# Patient Record
Sex: Female | Born: 1985 | Race: White | Hispanic: No | Marital: Single | Smoking: Current every day smoker
Health system: Southern US, Community
[De-identification: ages and names within clinical notes are randomized; demographics above are authoritative.]

## PROBLEM LIST (undated history)

## (undated) DIAGNOSIS — I2690 Septic pulmonary embolism without acute cor pulmonale: Secondary | ICD-10-CM

## (undated) DIAGNOSIS — R600 Localized edema: Secondary | ICD-10-CM

## (undated) DIAGNOSIS — L03011 Cellulitis of right finger: Secondary | ICD-10-CM

## (undated) DIAGNOSIS — Z4659 Encounter for fitting and adjustment of other gastrointestinal appliance and device: Secondary | ICD-10-CM

## (undated) DIAGNOSIS — F1990 Other psychoactive substance use, unspecified, uncomplicated: Secondary | ICD-10-CM

## (undated) DIAGNOSIS — R8290 Unspecified abnormal findings in urine: Secondary | ICD-10-CM

## (undated) DIAGNOSIS — D72819 Decreased white blood cell count, unspecified: Secondary | ICD-10-CM

## (undated) DIAGNOSIS — D65 Disseminated intravascular coagulation [defibrination syndrome]: Secondary | ICD-10-CM

## (undated) DIAGNOSIS — J189 Pneumonia, unspecified organism: Secondary | ICD-10-CM

## (undated) DIAGNOSIS — N3001 Acute cystitis with hematuria: Secondary | ICD-10-CM

## (undated) DIAGNOSIS — L0291 Cutaneous abscess, unspecified: Secondary | ICD-10-CM

## (undated) DIAGNOSIS — Z515 Encounter for palliative care: Secondary | ICD-10-CM

## (undated) DIAGNOSIS — Z9119 Patient's noncompliance with other medical treatment and regimen: Secondary | ICD-10-CM

## (undated) DIAGNOSIS — A4902 Methicillin resistant Staphylococcus aureus infection, unspecified site: Secondary | ICD-10-CM

## (undated) DIAGNOSIS — M4628 Osteomyelitis of vertebra, sacral and sacrococcygeal region: Secondary | ICD-10-CM

## (undated) DIAGNOSIS — N183 Chronic kidney disease, stage 3 unspecified: Secondary | ICD-10-CM

## (undated) DIAGNOSIS — A419 Sepsis, unspecified organism: Secondary | ICD-10-CM

## (undated) DIAGNOSIS — Z765 Malingerer [conscious simulation]: Secondary | ICD-10-CM

## (undated) DIAGNOSIS — R7881 Bacteremia: Secondary | ICD-10-CM

## (undated) DIAGNOSIS — D61818 Other pancytopenia: Secondary | ICD-10-CM

## (undated) DIAGNOSIS — G061 Intraspinal abscess and granuloma: Secondary | ICD-10-CM

## (undated) DIAGNOSIS — Z720 Tobacco use: Secondary | ICD-10-CM

## (undated) DIAGNOSIS — I2601 Septic pulmonary embolism with acute cor pulmonale: Secondary | ICD-10-CM

## (undated) DIAGNOSIS — N179 Acute kidney failure, unspecified: Secondary | ICD-10-CM

## (undated) DIAGNOSIS — R161 Splenomegaly, not elsewhere classified: Secondary | ICD-10-CM

## (undated) DIAGNOSIS — D731 Hypersplenism: Secondary | ICD-10-CM

## (undated) DIAGNOSIS — B9562 Methicillin resistant Staphylococcus aureus infection as the cause of diseases classified elsewhere: Secondary | ICD-10-CM

## (undated) HISTORY — PX: TONSILLECTOMY: SUR1361

---
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
## 2013-07-03 ENCOUNTER — Inpatient Hospital Stay (HOSPITAL_COMMUNITY)
Admission: EM | Admit: 2013-07-03 | Discharge: 2013-07-09 | DRG: 288 | Discharge status: Against Medical Advice | Payer: Self-pay | Attending: Family Medicine | Admitting: Family Medicine

## 2013-07-03 ENCOUNTER — Encounter (HOSPITAL_COMMUNITY): Payer: Self-pay | Admitting: Emergency Medicine

## 2013-07-03 ENCOUNTER — Emergency Department (HOSPITAL_COMMUNITY): Payer: Self-pay

## 2013-07-03 DIAGNOSIS — I358 Other nonrheumatic aortic valve disorders: Secondary | ICD-10-CM

## 2013-07-03 DIAGNOSIS — J852 Abscess of lung without pneumonia: Secondary | ICD-10-CM | POA: Diagnosis present

## 2013-07-03 DIAGNOSIS — I330 Acute and subacute infective endocarditis: Principal | ICD-10-CM | POA: Diagnosis present

## 2013-07-03 DIAGNOSIS — I280 Arteriovenous fistula of pulmonary vessels: Secondary | ICD-10-CM | POA: Diagnosis present

## 2013-07-03 DIAGNOSIS — L0291 Cutaneous abscess, unspecified: Secondary | ICD-10-CM

## 2013-07-03 DIAGNOSIS — J9601 Acute respiratory failure with hypoxia: Secondary | ICD-10-CM

## 2013-07-03 DIAGNOSIS — I76 Septic arterial embolism: Secondary | ICD-10-CM

## 2013-07-03 DIAGNOSIS — E876 Hypokalemia: Secondary | ICD-10-CM | POA: Diagnosis present

## 2013-07-03 DIAGNOSIS — I272 Pulmonary hypertension, unspecified: Secondary | ICD-10-CM | POA: Diagnosis present

## 2013-07-03 DIAGNOSIS — L03119 Cellulitis of unspecified part of limb: Secondary | ICD-10-CM

## 2013-07-03 DIAGNOSIS — L03011 Cellulitis of right finger: Secondary | ICD-10-CM

## 2013-07-03 DIAGNOSIS — I079 Rheumatic tricuspid valve disease, unspecified: Secondary | ICD-10-CM | POA: Diagnosis present

## 2013-07-03 DIAGNOSIS — J9600 Acute respiratory failure, unspecified whether with hypoxia or hypercapnia: Secondary | ICD-10-CM | POA: Diagnosis present

## 2013-07-03 DIAGNOSIS — L0390 Cellulitis, unspecified: Secondary | ICD-10-CM

## 2013-07-03 DIAGNOSIS — F1120 Opioid dependence, uncomplicated: Secondary | ICD-10-CM | POA: Diagnosis present

## 2013-07-03 DIAGNOSIS — I2789 Other specified pulmonary heart diseases: Secondary | ICD-10-CM | POA: Diagnosis present

## 2013-07-03 DIAGNOSIS — B1920 Unspecified viral hepatitis C without hepatic coma: Secondary | ICD-10-CM

## 2013-07-03 DIAGNOSIS — A4902 Methicillin resistant Staphylococcus aureus infection, unspecified site: Secondary | ICD-10-CM | POA: Diagnosis present

## 2013-07-03 DIAGNOSIS — R7881 Bacteremia: Secondary | ICD-10-CM | POA: Diagnosis present

## 2013-07-03 DIAGNOSIS — I2690 Septic pulmonary embolism without acute cor pulmonale: Secondary | ICD-10-CM | POA: Diagnosis present

## 2013-07-03 DIAGNOSIS — D649 Anemia, unspecified: Secondary | ICD-10-CM | POA: Diagnosis present

## 2013-07-03 DIAGNOSIS — IMO0002 Reserved for concepts with insufficient information to code with codable children: Secondary | ICD-10-CM | POA: Diagnosis present

## 2013-07-03 DIAGNOSIS — F1910 Other psychoactive substance abuse, uncomplicated: Secondary | ICD-10-CM

## 2013-07-03 DIAGNOSIS — F17200 Nicotine dependence, unspecified, uncomplicated: Secondary | ICD-10-CM | POA: Diagnosis present

## 2013-07-03 HISTORY — DX: Methicillin resistant Staphylococcus aureus infection, unspecified site: A49.02

## 2013-07-03 HISTORY — DX: Other psychoactive substance use, unspecified, uncomplicated: F19.90

## 2013-07-03 HISTORY — DX: Cutaneous abscess, unspecified: L02.91

## 2013-07-03 LAB — HEPATIC FUNCTION PANEL
ALT: 8 U/L (ref 0–35)
Alkaline Phosphatase: 117 U/L (ref 39–117)
Bilirubin, Direct: 0.1 mg/dL (ref 0.0–0.3)
Indirect Bilirubin: 0.3 mg/dL (ref 0.3–0.9)
Total Bilirubin: 0.4 mg/dL (ref 0.3–1.2)

## 2013-07-03 LAB — CBC
Hemoglobin: 7.7 g/dL — ABNORMAL LOW (ref 12.0–15.0)
MCH: 24.4 pg — ABNORMAL LOW (ref 26.0–34.0)
MCHC: 32.5 g/dL (ref 30.0–36.0)
MCV: 75 fL — ABNORMAL LOW (ref 78.0–100.0)
Platelets: 252 10*3/uL (ref 150–400)
RBC: 3.16 MIL/uL — ABNORMAL LOW (ref 3.87–5.11)

## 2013-07-03 LAB — BASIC METABOLIC PANEL
CO2: 31 mEq/L (ref 19–32)
Calcium: 8.4 mg/dL (ref 8.4–10.5)
Creatinine, Ser: 0.89 mg/dL (ref 0.50–1.10)
GFR calc non Af Amer: 89 mL/min — ABNORMAL LOW (ref >90)
Glucose, Bld: 109 mg/dL — ABNORMAL HIGH (ref 70–99)

## 2013-07-03 MED ORDER — HEPARIN SODIUM (PORCINE) 5000 UNIT/ML IJ SOLN
5000.0000 [IU] | Freq: Three times a day (TID) | INTRAMUSCULAR | Status: DC
  Administered 2013-07-03: 5000 [IU] via SUBCUTANEOUS
  Filled 2013-07-03 (×4): qty 1

## 2013-07-03 MED ORDER — VANCOMYCIN HCL IN DEXTROSE 1-5 GM/200ML-% IV SOLN
1000.0000 mg | Freq: Once | INTRAVENOUS | Status: AC
  Administered 2013-07-03: 1000 mg via INTRAVENOUS
  Filled 2013-07-03: qty 200

## 2013-07-03 MED ORDER — ACETAMINOPHEN 325 MG PO TABS
650.0000 mg | ORAL_TABLET | Freq: Once | ORAL | Status: AC
  Administered 2013-07-03: 650 mg via ORAL
  Filled 2013-07-03: qty 2

## 2013-07-03 MED ORDER — SODIUM CHLORIDE 0.9 % IJ SOLN
3.0000 mL | Freq: Two times a day (BID) | INTRAMUSCULAR | Status: DC
  Administered 2013-07-04 – 2013-07-08 (×3): 3 mL via INTRAVENOUS

## 2013-07-03 MED ORDER — ACETAMINOPHEN 325 MG PO TABS
650.0000 mg | ORAL_TABLET | Freq: Four times a day (QID) | ORAL | Status: DC | PRN
  Administered 2013-07-04 – 2013-07-08 (×9): 650 mg via ORAL
  Filled 2013-07-03 (×9): qty 2

## 2013-07-03 MED ORDER — VANCOMYCIN HCL IN DEXTROSE 750-5 MG/150ML-% IV SOLN: 750.0000 mg | Freq: Two times a day (BID) | INTRAVENOUS | Status: DC

## 2013-07-03 MED ORDER — VANCOMYCIN HCL IN DEXTROSE 750-5 MG/150ML-% IV SOLN
750.0000 mg | Freq: Two times a day (BID) | INTRAVENOUS | Status: DC
  Administered 2013-07-04 – 2013-07-06 (×4): 750 mg via INTRAVENOUS
  Filled 2013-07-03 (×5): qty 150

## 2013-07-03 MED ORDER — NICOTINE 21 MG/24HR TD PT24
21.0000 mg | MEDICATED_PATCH | Freq: Once | TRANSDERMAL | Status: AC
  Administered 2013-07-03 – 2013-07-04 (×2): 21 mg via TRANSDERMAL
  Filled 2013-07-03 (×2): qty 1

## 2013-07-03 NOTE — H&P (Signed)
[facility] History and Physical  Molly [name] [id number] DOB: 1986-04-12 DOA: 07/03/2013  Referring physician: ED PCP: No primary provider on file.   Chief Complaint: Endocarditis needs treatment  HPI: Molly [name] is a 27 y.o. female who presents to the ED at [facility] after having left AMA from [facility] 5 days ago after being diagnosed with MRSA bacteremia (Multiple cultures positive see their discharge note for description, sensitive to vancomycin with MIC to vancomycin < 0.5), multiple lung abscesses suspicious of septic emboli.  TEE was still pending at that time to confirm diagnosis of bacterial endocarditis.  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  Review of Systems: 12 systems reviewed and otherwise negative.  Past Medical History  Diagnosis Date  . IV drug user    History reviewed. No pertinent past surgical history. Social History:  reports that she has been smoking.  She does not have any smokeless tobacco history on file. She reports that she uses illicit drugs (IV). She reports that she does not drink alcohol.   No Known Allergies  History reviewed. No pertinent family history.   Prior to Admission medications   Medication Sig Start Date End Date Taking? Authorizing Provider  acetaminophen (TYLENOL) 500 MG tablet Take 1,000 mg by mouth every 6 (six) hours as needed for pain.   Yes Historical Provider, MD   Physical Exam: Filed Vitals:   07/03/13 2130  BP: 116/67  Pulse: 95  Temp:   Resp: 33    General:  NAD, resting comfortably in bed Eyes: PEERLA EOMI ENT: mucous membranes moist Neck: supple w/o JVD Cardiovascular: RRR does seem to have a murmur, sounds more diastolic, cant really say more. Respiratory: CTA B Abdomen: soft, nt, nd, bs+ Skin: no rash nor lesion Musculoskeletal: MAE, full ROM all 4 extremities Psychiatric: normal tone and affect Neurologic: AAOx3, grossly  non-focal  Labs on Admission:  Basic Metabolic Panel:  Recent Labs Lab 07/03/13 1753  NA 136  K 3.0*  CL 99  CO2 31  GLUCOSE 109*  BUN 18  CREATININE 0.89  CALCIUM 8.4   Liver Function Tests:  Recent Labs Lab 07/03/13 1929  AST 13  ALT 8  ALKPHOS 117  BILITOT 0.4  PROT 7.1  ALBUMIN 2.3*   No results found for this basename: LIPASE, AMYLASE,  in the last 168 hours No results found for this basename: AMMONIA,  in the last 168 hours CBC:  Recent Labs Lab 07/03/13 1753  WBC 5.6  HGB 7.7*  HCT 23.7*  MCV 75.0*  PLT 252   Cardiac Enzymes: No results found for this basename: CKTOTAL, CKMB, CKMBINDEX, TROPONINI,  in the last 168 hours  BNP (last 3 results)  Recent Labs  07/03/13 1919  PROBNP 3241.0*   CBG: No results found for this basename: GLUCAP,  in the last 168 hours  Radiological Exams on Admission: Dg Chest 2 View  07/03/2013   CLINICAL DATA:  Chest pain, fever, shortness of Breath  EXAM: CHEST  2 VIEW  COMPARISON:  06/22/2013 and 06/23/2013  FINDINGS: Cardiomediastinal silhouette is stable. Persistent multifocal bilateral nodular consolidation. Largest in right midlung measures 1.8 cm. The largest in left midlung measures 1.1 cm. Findings are consistent with persistent multifocal pneumonia or septic emboli. No pulmonary edema.  IMPRESSION: Persistent multifocal bilateral nodular consolidation. The largest in right midlung measures 1.8 cm. The largest in left midlung measures 1.1 cm. Findings are consistent with  persistent multifocal pneumonia or septic emboli. Followup to assure resolution after treatment is recommended.   Electronically Signed   By: Lahoma Crocker   On: 07/03/2013 19:05    EKG: Independently reviewed.  Assessment/Plan Active Problems:   Multiple lung abscesses   Intravenous drug abuse, continuous   MRSA bacteremia   1. Multiple lung abscesses, MRSA bacteremia - in setting of IVDU, highly concerning for endocarditis, she left [facility] before she could have TEE performed.  Important to perform TEE as well as she does have some evidence of CHF (peripheral edema, elevated BNP).  Have spoken with cardiology on call and patient now being made NPO after midnight they plan on setting up probable TEE tomorrow, have put patient back on vancomycin.  Patient also running fever of 102.1 treating with vancomycin and tylenol.  Patient headed to SDU likely needs ID consult in AM, repeat BC drawn in ED, have ordered 1x tomorrow AM likely will need daily cultures until negative.   2. IVDU - will treat withdrawal symptoms as they occur likely with non-narcotics.  Discussed this up front with the patient including our inability to start her on methadone or suboxone for narcotic addiction treatment.  Patient indicates understanding and agrees.    Code Status: Full (must indicate code status--if unknown or must be presumed, indicate so) Family Communication: No family in room (indicate person spoken with, if applicable, with phone number if by telephone) Disposition Plan: Admit to inpatient (indicate anticipated LOS)  Time spent: 70 min  Stanislav Gervase M. [facility] Pager [phone]  If 7PM-7AM, please contact night-coverage [email or web address] Password [facility] 07/03/2013, 9:58 PM

## 2013-07-03 NOTE — ED Notes (Signed)
Pt reports she has been shooting up drugs, last week she had CP, went to [facility] and was told she had an infection around the heart along with pneumonia. sts she was given some medicine and fluids while she was there, sts she signed up [facility] because they wouldn't give her anymore pain medicine and felt that the staff wasn't very attentive there. So now the pain hasn't gone away, denies worsening in pain, pt sts she does want to get better and if she is admitted will stay. Pt reports she thinks her pain has actually gotten better since she was at [facility] but still hasn't gone away. Pt reports last used drugs last night. Denies use of ETOH. Reports she has had a fever and productive cough at home. Pt in nad, skin warm and dry, resp e/u.

## 2013-07-03 NOTE — ED Provider Notes (Addendum)
CSN: [id number]     Arrival date & time 07/03/13  1735 History   First MD Initiated Contact with Patient 07/03/13 1839     Chief Complaint  Patient presents with  . Chest Pain   (Consider location/radiation/quality/duration/timing/severity/associated sxs/prior Treatment) HPI Comments: Patient is an IV drug abuser who was admitted to [facility] on September 8 for septic emboli in her lungs concerning for bacterial endocarditis. Patient reports that she stayed overnight, but the next day she ended up leaving the hospital [place]. She did not take any further antibiotics after she left the hospital. Since leaving the hospital she has been having fever, chills, cough. She has been intravenously injecting Opana for the chest pain that she has been experiencing, which is likely secondary to the lung infections. Patient reports that the pain currently is just to the right of her sternum, mild to moderate. The pain has been moving around to different areas of the chest.  In addition to the pain, patient has noticed that she has been experiencing swelling of her hands and feet. This has started in the last couple of days.  Patient is a 27 y.o. female presenting with chest pain.  Chest Pain Associated symptoms: cough, fever and shortness of breath     Past Medical History  Diagnosis Date  . IV drug user    History reviewed. No pertinent past surgical history. History reviewed. No pertinent family history. History  Substance Use Topics  . Smoking status: Current Every Day Smoker  . Smokeless tobacco: Not on file  . Alcohol Use: No   OB History   Grav Para Term Preterm Abortions TAB SAB Ect Mult Living                 Review of Systems  Constitutional: Positive for fever.  Respiratory: Positive for cough and shortness of breath.   Cardiovascular: Positive for chest pain.  All other systems reviewed and are negative.    Allergies  Review of patient's allergies  indicates no known allergies.  Home Medications  No current outpatient prescriptions on file. BP 124/82  Pulse 94  Temp(Src) 99.3 F (37.4 C) (Oral)  Resp 18  SpO2 99% Physical Exam  Constitutional: She is oriented to person, place, and time. She appears well-developed and well-nourished. No distress.  HENT:  Head: Normocephalic and atraumatic.  Right Ear: Hearing normal.  Left Ear: Hearing normal.  Nose: Nose normal.  Mouth/Throat: Oropharynx is clear and moist and mucous membranes are normal.  Eyes: Conjunctivae and EOM are normal. Pupils are equal, round, and reactive to light.  Neck: Normal range of motion. Neck supple.  Cardiovascular: Regular rhythm, S1 normal and S2 normal.  Exam reveals no gallop and no friction rub.   No murmur heard. Pulmonary/Chest: Effort normal. No respiratory distress. She has rales. She exhibits no tenderness.  Abdominal: Soft. Normal appearance and bowel sounds are normal. There is no hepatosplenomegaly. There is no tenderness. There is no rebound, no guarding, no tenderness at McBurney's point and negative Murphy's sign. No hernia.  Musculoskeletal: Normal range of motion.  Neurological: She is alert and oriented to person, place, and time. She has normal strength. No cranial nerve deficit or sensory deficit. Coordination normal. GCS eye subscore is 4. GCS verbal subscore is 5. GCS motor subscore is 6.  Skin: Skin is warm, dry and intact. No rash noted. No cyanosis.  Psychiatric: She has a normal mood and affect. Her speech is normal and behavior is normal.  Thought content normal.    ED Course  Procedures (including critical care time) Labs Review Labs Reviewed  CULTURE, BLOOD (ROUTINE X 2)  CULTURE, BLOOD (ROUTINE X 2)  CBC  BASIC METABOLIC PANEL   Imaging Review Dg Chest 2 View  07/03/2013   CLINICAL DATA:  Chest pain, fever, shortness of Breath  EXAM: CHEST  2 VIEW  COMPARISON:  06/22/2013 and 06/23/2013  FINDINGS: Cardiomediastinal  silhouette is stable. Persistent multifocal bilateral nodular consolidation. Largest in right midlung measures 1.8 cm. The largest in left midlung measures 1.1 cm. Findings are consistent with persistent multifocal pneumonia or septic emboli. No pulmonary edema.  IMPRESSION: Persistent multifocal bilateral nodular consolidation. The largest in right midlung measures 1.8 cm. The largest in left midlung measures 1.1 cm. Findings are consistent with persistent multifocal pneumonia or septic emboli. Followup to assure resolution after treatment is recommended.   Electronically Signed   By: Lahoma Crocker   On: 07/03/2013 19:05    MDM  Diagnosis: 1. Bilateral septic emboli in the lungs secondary to IV drug use 2. Possible endocarditis  The patient's records from doctors were reviewed. Patient was seen and evaluated on September 8. Blood cultures ultimately grew MRSA sensitive to clindamycin and vancomycin. A transthoracic echo was performed that did not show any significant abnormalities. Prior to further studies, however, patient left the hospital [place]. She left on September 9, has not had any treatment since then. Patient has had persistent cough, fever and chest pain. X-ray today shows persistent multifocal pneumonia consistent with known septic emboli. She will require repeat hospitalization for antibiotic therapy and further workup.    Orpah Greek, MD 07/06/13 [place], MD 07/11/13 [phone]

## 2013-07-03 NOTE — Progress Notes (Addendum)
ANTIBIOTIC CONSULT NOTE - INITIAL  Pharmacy Consult:  Vancomycin Indication:  Endocarditis   No Known Allergies  Patient Measurements: Height: 5\' 2"  (157.5 cm) Weight: 113 lb (51.256 kg) IBW/kg (Calculated) : 50.1  Vital Signs: Temp: 99.3 F (37.4 C) (09/16 1748) Temp src: Oral (09/16 1748) BP: 128/82 mmHg (09/16 2030) Pulse Rate: 99 (09/16 2030)  Labs:  Recent Labs  07/03/13 1753  WBC 5.6  HGB 7.7*  PLT 252  CREATININE 0.89   Estimated Creatinine Clearance: 75.8 ml/min (by C-G formula based on Cr of 0.89). No results found for this basename: VANCOTROUGH, VANCOPEAK, VANCORANDOM, GENTTROUGH, GENTPEAK, GENTRANDOM, TOBRATROUGH, TOBRAPEAK, TOBRARND, AMIKACINPEAK, AMIKACINTROU, AMIKACIN,  in the last 72 hours   Microbiology: No results found for this or any previous visit (from the past 720 hour(s)).  Medical History: Past Medical History  Diagnosis Date  . IV drug user        Assessment: 66 YOF with history of IVDU recently admitted to [facility] with diagnosis of endocarditis and PNA (last week).  Patient left AMA and presented to [facility] today 07/03/14.  Pharmacy asked to start IV vancomycin.  Baseline labs reviewed.   Goal of Therapy:  Vancomycin trough level 15-20 mcg/ml   Plan:  - Vanc 1gm IV x 1 now, then 750mg  IV Q12H - Monitor renal fxn, C/S, vanc trough at Css as patient may need Q8H dosing interval - F/U KCL supplementation    Ella Golomb D. Mina Marble, PharmD, BCPS Pager:  [phone] 07/03/2013, 9:05 PM

## 2013-07-03 NOTE — ED Provider Notes (Signed)
Angiocath insertion Performed by: Madaline Brilliant  Consent: Verbal consent obtained. Risks and benefits: risks, benefits and alternatives were discussed Time out: Immediately prior to procedure a "time out" was called to verify the correct patient, procedure, equipment, support staff and site/side marked as required.  Preparation: Patient was prepped and draped in the usual sterile fashion.  Vein Location: left brachial  Ultrasound Guided  Gauge: 20G  Normal blood return and flush without difficulty Patient tolerance: Patient tolerated the procedure well with no immediate complications.  Samantha Crimes. Marshell Levan, MD Emergency Medicine PGY-III      Madaline Brilliant, MD 07/03/13 [name]

## 2013-07-03 NOTE — ED Notes (Signed)
Pt returned from radiology.

## 2013-07-03 NOTE — ED Provider Notes (Signed)
I saw and evaluated the patient, reviewed the resident's note and I agree with the findings and plan.  IV placed with ultrasound guidance under my supervision.  Orpah Greek, MD 07/03/13 [phone]

## 2013-07-03 NOTE — ED Notes (Signed)
Pt here with recent diagnosis of endocarditis; pt sts was admitted at [facility] for same and signed self out AMA; pt sts IV drug use most recently last night; pt sts used opana IV last night

## 2013-07-04 ENCOUNTER — Encounter (HOSPITAL_COMMUNITY): Payer: Self-pay | Admitting: *Deleted

## 2013-07-04 ENCOUNTER — Encounter (HOSPITAL_COMMUNITY)
Admission: EM | Discharge status: Against Medical Advice | Payer: Self-pay | Source: Home / Self Care | Attending: Family Medicine

## 2013-07-04 DIAGNOSIS — E876 Hypokalemia: Secondary | ICD-10-CM | POA: Diagnosis present

## 2013-07-04 DIAGNOSIS — R7881 Bacteremia: Secondary | ICD-10-CM

## 2013-07-04 DIAGNOSIS — B1920 Unspecified viral hepatitis C without hepatic coma: Secondary | ICD-10-CM

## 2013-07-04 DIAGNOSIS — I358 Other nonrheumatic aortic valve disorders: Secondary | ICD-10-CM

## 2013-07-04 DIAGNOSIS — J9600 Acute respiratory failure, unspecified whether with hypoxia or hypercapnia: Secondary | ICD-10-CM | POA: Diagnosis present

## 2013-07-04 DIAGNOSIS — I280 Arteriovenous fistula of pulmonary vessels: Secondary | ICD-10-CM | POA: Diagnosis present

## 2013-07-04 DIAGNOSIS — D649 Anemia, unspecified: Secondary | ICD-10-CM | POA: Diagnosis present

## 2013-07-04 DIAGNOSIS — I272 Pulmonary hypertension, unspecified: Secondary | ICD-10-CM | POA: Diagnosis present

## 2013-07-04 HISTORY — PX: TEE WITHOUT CARDIOVERSION: SHX5443

## 2013-07-04 LAB — BASIC METABOLIC PANEL
CO2: 29 mEq/L (ref 19–32)
Calcium: 8 mg/dL — ABNORMAL LOW (ref 8.4–10.5)
Creatinine, Ser: 0.82 mg/dL (ref 0.50–1.10)
Glucose, Bld: 113 mg/dL — ABNORMAL HIGH (ref 70–99)

## 2013-07-04 LAB — CBC
Hemoglobin: 6.1 g/dL — CL (ref 12.0–15.0)
MCHC: 32.6 g/dL (ref 30.0–36.0)
Platelets: 231 10*3/uL (ref 150–400)

## 2013-07-04 LAB — MRSA PCR SCREENING: MRSA by PCR: POSITIVE — AB

## 2013-07-04 LAB — ABO/RH: ABO/RH(D): O NEG

## 2013-07-04 LAB — PREPARE RBC (CROSSMATCH)

## 2013-07-04 SURGERY — ECHOCARDIOGRAM, TRANSESOPHAGEAL
Anesthesia: Moderate Sedation

## 2013-07-04 MED ORDER — FENTANYL CITRATE 0.05 MG/ML IJ SOLN
INTRAMUSCULAR | Status: AC
  Filled 2013-07-04: qty 4

## 2013-07-04 MED ORDER — SODIUM CHLORIDE 0.9 % IV SOLN: INTRAVENOUS | Status: DC

## 2013-07-04 MED ORDER — MIDAZOLAM HCL 10 MG/2ML IJ SOLN
INTRAMUSCULAR | Status: DC | PRN
  Administered 2013-07-04 (×4): 2 mg via INTRAVENOUS

## 2013-07-04 MED ORDER — PNEUMOCOCCAL VAC POLYVALENT 25 MCG/0.5ML IJ INJ
0.5000 mL | INJECTION | Freq: Once | INTRAMUSCULAR | Status: AC
  Administered 2013-07-06: 0.5 mL via INTRAMUSCULAR
  Filled 2013-07-04: qty 0.5

## 2013-07-04 MED ORDER — SODIUM CHLORIDE 0.9 % IV SOLN
INTRAVENOUS | Status: DC
  Administered 2013-07-04: 19:00:00 1 mL via INTRAVENOUS
  Administered 2013-07-06: 20 mL/h via INTRAVENOUS
  Administered 2013-07-07: 22:00:00 via INTRAVENOUS
  Administered 2013-07-08: 20 mL/h via INTRAVENOUS

## 2013-07-04 MED ORDER — NICOTINE 21 MG/24HR TD PT24
21.0000 mg | MEDICATED_PATCH | Freq: Every day | TRANSDERMAL | Status: DC
  Administered 2013-07-04 – 2013-07-08 (×6): 21 mg via TRANSDERMAL
  Filled 2013-07-04 (×6): qty 1

## 2013-07-04 MED ORDER — LORAZEPAM 1 MG PO TABS: 1.0000 mg | ORAL_TABLET | Freq: Four times a day (QID) | ORAL | Status: DC | PRN

## 2013-07-04 MED ORDER — POTASSIUM CHLORIDE CRYS ER 20 MEQ PO TBCR
40.0000 meq | EXTENDED_RELEASE_TABLET | Freq: Once | ORAL | Status: AC
  Administered 2013-07-04: 40 meq via ORAL
  Filled 2013-07-04: qty 2

## 2013-07-04 MED ORDER — BUTAMBEN-TETRACAINE-BENZOCAINE 2-2-14 % EX AERO
INHALATION_SPRAY | CUTANEOUS | Status: DC | PRN
  Administered 2013-07-04: 2 via TOPICAL

## 2013-07-04 MED ORDER — MORPHINE SULFATE 2 MG/ML IJ SOLN
1.0000 mg | INTRAMUSCULAR | Status: DC | PRN
  Administered 2013-07-07 – 2013-07-08 (×6): 2 mg via INTRAVENOUS
  Filled 2013-07-04 (×6): qty 1

## 2013-07-04 MED ORDER — MIDAZOLAM HCL 5 MG/ML IJ SOLN
INTRAMUSCULAR | Status: AC
  Filled 2013-07-04: qty 20

## 2013-07-04 MED ORDER — INFLUENZA VAC SPLIT QUAD 0.5 ML IM SUSP
0.5000 mL | INTRAMUSCULAR | Status: AC
  Filled 2013-07-04: qty 0.5

## 2013-07-04 MED ORDER — LORAZEPAM 1 MG PO TABS
1.0000 mg | ORAL_TABLET | Freq: Four times a day (QID) | ORAL | Status: DC | PRN
  Administered 2013-07-04 – 2013-07-08 (×7): 1 mg via ORAL
  Filled 2013-07-04 (×7): qty 1

## 2013-07-04 MED ORDER — FENTANYL CITRATE 0.05 MG/ML IJ SOLN
INTRAMUSCULAR | Status: DC | PRN
  Administered 2013-07-04: 25 ug via INTRAVENOUS
  Administered 2013-07-04: 50 ug via INTRAVENOUS
  Administered 2013-07-04: 25 ug via INTRAVENOUS

## 2013-07-04 MED ORDER — CHLORHEXIDINE GLUCONATE CLOTH 2 % EX PADS
6.0000 | MEDICATED_PAD | Freq: Every day | CUTANEOUS | Status: DC
  Administered 2013-07-04 – 2013-07-08 (×3): 6 via TOPICAL

## 2013-07-04 MED ORDER — MUPIROCIN 2 % EX OINT
1.0000 "application " | TOPICAL_OINTMENT | Freq: Two times a day (BID) | CUTANEOUS | Status: DC
  Administered 2013-07-04 – 2013-07-08 (×9): 1 via NASAL
  Filled 2013-07-04: qty 22

## 2013-07-04 NOTE — Progress Notes (Signed)
CRITICAL VALUE ALERT  Critical value received:  Positive blood cultures - gram positive cocci and clusters aerobic bottle  Date of notification:  06/1713  Time of notification:  1505  Critical value read back:yes  Nurse who received alert:  Martinique Perkins  MD notified (1st page):  Erin Hearing  Time of first page:  1505  MD notified (2nd page):  Time of second page:  Responding MD:  Erin Hearing  Time MD responded:  [place]  Perkins, [place] Elizabeth

## 2013-07-04 NOTE — Interval H&P Note (Signed)
History and Physical Interval Note:  07/04/2013 2:18 PM  Molly [name]  has presented today for surgery, with the diagnosis of r/o endocarditis   The various methods of treatment have been discussed with the patient and family. After consideration of risks, benefits and other options for treatment, the patient has consented to  Procedure(s): TRANSESOPHAGEAL ECHOCARDIOGRAM (TEE) (N/A) as a surgical intervention .  The patient's history has been reviewed, patient examined, no change in status, stable for surgery.  I have reviewed the patient's chart and labs.  Questions were answered to the patient's satisfaction.     Lameshia Hypolite [employer]

## 2013-07-04 NOTE — Progress Notes (Signed)
Clinical Social Work Department CLINICAL SOCIAL WORK PLACEMENT NOTE 07/04/2013  Patient:  Molly [name], Molly [name]  Account Number:  [id number] [place] date:  07/03/2013  Clinical Social Worker:  Ky Barban, Latanya Presser  Date/time:  07/04/2013 11:30 AM  Clinical Social Work is seeking post-discharge placement for this patient at the following level of care:   [place]   (*CSW will update this form in Epic as items are completed)   07/04/2013  Patient/family provided with [place] Department of Clinical Social Work's list of facilities offering this level of care within the geographic area requested by the patient (or if unable, by the patient's family).  07/04/2013  Patient/family informed of their freedom to choose among providers that offer the needed level of care, that participate in Medicare, Medicaid or managed care program needed by the patient, have an available bed and are willing to accept the patient.  07/04/2013  Patient/family informed of [facility]' ownership interest in [facility], as well as of the fact that they are under no obligation to receive care at this facility.  PASARR submitted to EDS on 07/04/2013 PASARR number received from EDS on 07/04/2013  FL2 transmitted to all facilities in geographic area requested by pt/family on  07/04/2013 FL2 transmitted to all facilities within larger geographic area on   Patient informed that his/her managed care company has contracts with or will negotiate with  certain facilities, including the following:   Patient has no insurance. Discussed possible Medicaid with patient and patient's mother, who was at bedside.     Patient/family informed of bed offers received:   Patient chooses bed at  Physician recommends and patient chooses bed at    Patient to be transferred to  on   Patient to be transferred to facility by   The following physician request were entered in Epic:   Additional Comments:   Ky Barban, MSW, [place] Worker [phone]

## 2013-07-04 NOTE — Progress Notes (Signed)
[facility] lab called with second [place] blood culture. Text page to K. Schorr for notification. Dorthey Sawyer

## 2013-07-04 NOTE — Progress Notes (Signed)
Clinical Social Work Department BRIEF PSYCHOSOCIAL ASSESSMENT 07/04/2013  Patient:  Molly [name], Molly [name]     Account Number:  [id number]     [place] date:  07/03/2013  Clinical Social Worker:  Freeman Caldron  Date/Time:  07/04/2013 10:41 AM  Referred by:  Physician  Date Referred:  07/04/2013 Referred for  Substance Abuse  Psychosocial assessment   Other Referral:   Interview type:  Patient Other interview type:   Mother also at bedside during assessment.    PSYCHOSOCIAL DATA Living Status:  FAMILY Admitted from facility:   Level of care:   Primary support name:   Primary support relationship to patient:  PARENT Degree of support available:   Good-- patient lives with her mother and father in [place].    CURRENT CONCERNS Current Concerns  Adjustment to Illness  Substance Abuse  Post-Acute Placement   Other Concerns:    SOCIAL WORK ASSESSMENT / PLAN CSW met with patient and patient's mother at bedside. Patient expressed that she has used IV-drug Opana for 2 years. She was introduced to Opana by an ex-boyfriend. CSW asked patient what she knows about her current diagnoses, and patient explained that her heart is "shooting stuff out into her lungs," and that her drug use has negatively impacted her health. Patient lives at home with her mother and father in [place]. Patient is not currently working, and expressed a desire to stop using drugs. Patient does not have insurance.   Assessment/plan status:  Other - See comment Other assessment/ plan:   Patient reports that she will need IV-ABX for 6 weeks upon discharge, so CSW advised patient she will have to go to a SNF. Patient understands and is accepting of this. CSW provided patient's mother with a list of facilities. Mother expressed a preference of SNFs in either [place] (where she works) or [place] (where the family lives). CSW explained that SNF placement will depend the facility's ability to accept patient given she  has no insurance.   Information/referral to community resources:   SNF list provided to patient's mother.    PATIENT'S/FAMILY'S RESPONSE TO PLAN OF CARE: Patient and patient's mother receptive of CSW visit--CSW provided contact information for both herself and Eduard Clos, MSW.       Ky Barban, MSW, [facility] Clinical Social Worker [phone]

## 2013-07-04 NOTE — Progress Notes (Addendum)
eLink Physician-Brief Progress Note Patient Name: Alithea Avila DOB: [birth year] MRN: [id number]  Date of Service  07/04/2013   HPI/Events of Note   Recent Labs Lab 07/03/13 1753 07/04/13 0520  HGB 7.7* 6.1*    Recent Labs Lab 07/03/13 1753 07/04/13 0330  CREATININE 0.89 0.82     Recent Labs Lab 07/03/13 1753 07/04/13 0330  NA 136 136  K 3.0* 3.3*  CL 99 100  CO2 31 29  GLUCOSE 109* 113*  BUN 18 13  CREATININE 0.89 0.82  CALCIUM 8.4 8.0*    Recent Labs Lab 07/03/13 1753 07/04/13 0520  PLT 252 231    No results found for this basename: TROPONINI,  in the last 168 hours  No active bleeding per RN No menses per RN No GI bleed No hematuria No flank tenderness   eICU Interventions  Anemia of Critical Illness  PLAN 1 unit PRBC Sitter at bedside to avoid patient doing drugs in room surreptioulsuy -> wil cancel becausepatient threated AMA      Mahkai Fangman 07/04/2013, 6:39 AM

## 2013-07-04 NOTE — Care Management Note (Addendum)
    Page 1 of 1   07/09/2013     8:20:04 AM   CARE MANAGEMENT NOTE 07/09/2013  Patient:  ZAKAIYA, GROSSMAN   Account Number:  [id number]  Date Initiated:  07/04/2013  Documentation initiated by:  Elissa Hefty  Subjective/Objective Assessment:   adm w bacteremia     Action/Plan:   lives w fam, no ins listed. pt from [place], hx if drug use per chart  9/18 consult for LTAC, pt does not have acute needs. Not eligible for [facility] or [facility].   Anticipated DC Date:  07/07/2013   Anticipated DC Plan:  LONG TERM ACUTE CARE (LTAC)  In-house referral  Clinical Social Worker      DC Forensic scientist  CM consult  [facility]      Choice offered to / List presented to:             Status of service:  Completed, signed off Medicare Important Message given?   (If response is "NO", the following Medicare IM given date fields will be blank) Date Medicare IM given:   Date Additional Medicare IM given:    Discharge Disposition:  [place]  Per UR Regulation:  Reviewed for med. necessity/level of care/duration of stay  If discussed at [place] of Stay Meetings, dates discussed:    Comments:  07/06/2013 Consult for LTAC, [facility] will not accept as pt has no payor source. Will contact [facility] however pt does have acute care needs, therefore most likely not eligible. Jasmine Pang RN MPH, [phone] [place] Spoke with [facility] and pt is not appropriate for admission to LTAC. Jasmine Pang RN MPH, case manager, [phone]  9/17 [phone] debbie dowell rn,bsn spoke w pt. no ins. lives in [place]. left pt resource list for [facility] clinic in [place] that she can contact if she would like to establish pcp. will follow to see meds pt will be on at disch.

## 2013-07-04 NOTE — Progress Notes (Signed)
Pt is scheduled for TEE @ 3pm with Dr Aundra Dubin in endo to r/o endocarditis. PA/NP will write orders and explain procedure to the pt.  Addendum:  I spoke with patient regarding indication for TEE, complications, sedation, and potential findings.  She is agreeable to proceed.

## 2013-07-04 NOTE — CV Procedure (Signed)
Procedure: TEE  Indication: History of MRSA bacteremia with septic emboli.  Assess for endocarditis.   Sedation: Versed 8 mg IV, Fentanyl 100 mcg IV  Findings:  Please see echo section for full report.  Normal LV size and systolic function, EF [id number].  Normal RV size and systolic function.  There was a 1 x 0.5 cm vegetation attached to the tricuspid valve.  There was mild TR.  The pulmonic, mitral, and aortic valves appeared normal.   No complications.   Impression: Tricuspid valve endocarditis.   Loralie Champagne 07/04/2013 2:39 PM

## 2013-07-04 NOTE — Progress Notes (Signed)
Nursing: Pt asked for purse . I asked patient if there was any drugs in the purse. She said yes. Patient handed a grey colored eyeglass case that had ECG electrode stuck to the top. Upon opening up case found two pairs of scissors, two grey colored spoons, hair ties, and sixteen used tuberculin syringes. I also found a lighter and several small plastic caps. I disposed of the tuberculin syringes and small plastic caps. Gave the grey case back to patient and noticed cigarettes in her open purse.  Discussed with patient the policy for no smoking on [facility] property. She verbalized understanding . She stated " I have a nicotine patch on, I'll be ok". Patient asked if there was any way she could get treatment for her drug addiction while in the hospital. Order placed for social worker and case Freight forwarder.

## 2013-07-04 NOTE — Consult Note (Signed)
INFECTIOUS DISEASE CONSULT NOTE  Date of Admission:  07/03/2013  Date of Consult:  07/04/2013  Reason for Consult: Endocarditis, MRSA bacteremia Referring Physician: Alcario Drought  Impression/Recommendation MRSA TV endocarditis Hep C  Would Check HIV test Check HIV RNA Recheck BCx Try to get her into inpt rehab  Comment- I made it clear to pt that she needs 6 weeks of therapy and that she cannot go home and use drugs with [facility]. She needs placement to complete her therapy.   Thank you so much for this interesting consult,   Bobby Rumpf (pager) [phone] [email or web address]  Molly [name] is an 27 y.o. female.  HPI: 27 yo F with hx of heroin use who was admitted to [facility] for a brief period and then left AMA on 9-10 (MRSA bacteremia and multiple lung abscesses). She left without any line in place and received no therapy. She did return to using drugs. She developed fever, continued cough prod of thick sputum. She returned to [facility] on 9-16 and underwent TEE and found to have TV vegitation. Her BCx from admission are 2/2 GPC.   Past Medical History  Diagnosis Date  . IV drug user   hepatitis C TV endocarditis  History reviewed. No pertinent past surgical history.   No Known Allergies  Medications:  Scheduled: . [MAR HOLD] Chlorhexidine Gluconate Cloth  6 each Topical Q0600  . [MAR HOLD] influenza vac split quadrivalent PF  0.5 mL Intramuscular Tomorrow-1000  . [facility] HOLD] mupirocin ointment  1 application Nasal BID  . [facility] HOLD] nicotine  21 mg Transdermal Once  . [facility] HOLD] nicotine  21 mg Transdermal Daily  . [facility] HOLD] pneumococcal 23 valent vaccine  0.5 mL Intramuscular Once  . [MAR HOLD] sodium chloride  3 mL Intravenous Q12H  . [facility] HOLD] vancomycin  750 mg Intravenous Q12H    Total days of antibiotics: 2 (vancomycin)          Social History:  reports that she has been smoking.  She does not have any smokeless tobacco history on file. She reports that she uses  illicit drugs (IV and Oxycodone). She reports that she does not drink alcohol.  History reviewed. No pertinent family history. Parents, grandparents healthy.  General ROS: denies- headaches, vision change, oral ulcers, diarrhea, change in urination. see HPI.   Blood pressure 125/86, pulse 85, temperature 97.9 F (36.6 C), temperature source Axillary, resp. rate 20, height 5\' 2"  (1.575 m), weight 51.256 kg (113 lb), SpO2 99.00%. General appearance: alert, cooperative and no distress Eyes: negative findings: conjunctivae and sclerae normal and pupils equal, round, reactive to light and accomodation Throat: normal findings: oropharynx pink & moist without lesions or evidence of thrush Neck: no adenopathy and supple, symmetrical, trachea midline Lungs: rhonchi base - left Heart: regular rate and rhythm Abdomen: normal findings: bowel sounds normal and soft, non-tender Extremities: edema none Skin: no embolic phenomena in hands or feet.    Results for orders placed during the hospital encounter of 07/03/13 (from the past 48 hour(s))  CBC     Status: Abnormal   Collection Time    07/03/13  5:53 PM      Result Value Range   WBC 5.6  4.0 - 10.5 K/uL   RBC 3.16 (*) 3.87 - 5.11 MIL/uL   Hemoglobin 7.7 (*) 12.0 - 15.0 g/dL   HCT 23.7 (*) 36.0 - 46.0 %   MCV 75.0 (*) 78.0 - 100.0 fL   MCH 24.4 (*) 26.0 - 34.0 pg  MCHC 32.5  30.0 - 36.0 g/dL   RDW 15.1  11.5 - 15.5 %   Platelets 252  150 - 400 K/uL  BASIC METABOLIC PANEL     Status: Abnormal   Collection Time    07/03/13  5:53 PM      Result Value Range   Sodium 136  135 - 145 mEq/L   Potassium 3.0 (*) 3.5 - 5.1 mEq/L   Chloride 99  96 - 112 mEq/L   CO2 31  19 - 32 mEq/L   Glucose, Bld 109 (*) 70 - 99 mg/dL   BUN 18  6 - 23 mg/dL   Creatinine, Ser 0.89  0.50 - 1.10 mg/dL   Calcium 8.4  8.4 - 10.5 mg/dL   GFR calc non Af Amer 89 (*) >90 mL/min   GFR calc Af Amer >90  >90 mL/min   Comment: (NOTE)     The eGFR has been calculated  using the CKD EPI equation.     This calculation has not been validated in all clinical situations.     eGFR's persistently <90 mL/min signify possible Chronic Kidney     Disease.  PRO B NATRIURETIC PEPTIDE     Status: Abnormal   Collection Time    07/03/13  7:19 PM      Result Value Range   Pro B Natriuretic peptide (BNP) 3241.0 (*) 0 - 125 pg/mL  HEPATIC FUNCTION PANEL     Status: Abnormal   Collection Time    07/03/13  7:29 PM      Result Value Range   Total Protein 7.1  6.0 - 8.3 g/dL   Albumin 2.3 (*) 3.5 - 5.2 g/dL   AST 13  0 - 37 U/L   ALT 8  0 - 35 U/L   Alkaline Phosphatase 117  39 - 117 U/L   Total Bilirubin 0.4  0.3 - 1.2 mg/dL   Bilirubin, Direct 0.1  0.0 - 0.3 mg/dL   Indirect Bilirubin 0.3  0.3 - 0.9 mg/dL  POCT I-STAT TROPONIN I     Status: None   Collection Time    07/03/13  7:44 PM      Result Value Range   Troponin i, poc 0.00  0.00 - 0.08 ng/mL   Comment 3            Comment: Due to the release kinetics of cTnI,     a negative result within the first hours     of the onset of symptoms does not rule out     myocardial infarction with certainty.     If myocardial infarction is still suspected,     repeat the test at appropriate intervals.  CULTURE, BLOOD (ROUTINE X 2)     Status: None   Collection Time    07/03/13  8:31 PM      Result Value Range   Specimen Description BLOOD HAND LEFT     Special Requests BOTTLES DRAWN AEROBIC ONLY 1.5CC     Culture  Setup Time       Value: 07/04/2013 05:08     Performed at [facility]   Culture       Value: [place] IN CLUSTERS     Note: Gram Stain Report Called to,Read Back By and Verified With: Martinique PERKINS 07/04/13 1505 BY SMITHERSJ     Performed at [facility]   Report Status PENDING    MRSA PCR SCREENING     Status:  Abnormal   Collection Time    07/03/13 10:19 PM      Result Value Range   MRSA by PCR POSITIVE (*) NEGATIVE   Comment:            The GeneXpert MRSA Assay (FDA      approved for NASAL specimens     only), is one component of a     comprehensive MRSA colonization     surveillance program. It is not     intended to diagnose MRSA     infection nor to guide or     monitor treatment for     MRSA infections.     RESULT CALLED TO, READ BACK BY AND VERIFIED WITH:     Dorothy Spark (RN) [phone] 07/04/2013 L. LOMAX  BASIC METABOLIC PANEL     Status: Abnormal   Collection Time    07/04/13  3:30 AM      Result Value Range   Sodium 136  135 - 145 mEq/L   Potassium 3.3 (*) 3.5 - 5.1 mEq/L   Chloride 100  96 - 112 mEq/L   CO2 29  19 - 32 mEq/L   Glucose, Bld 113 (*) 70 - 99 mg/dL   BUN 13  6 - 23 mg/dL   Creatinine, Ser 0.82  0.50 - 1.10 mg/dL   Calcium 8.0 (*) 8.4 - 10.5 mg/dL   GFR calc non Af Amer >90  >90 mL/min   GFR calc Af Amer >90  >90 mL/min   Comment: (NOTE)     The eGFR has been calculated using the CKD EPI equation.     This calculation has not been validated in all clinical situations.     eGFR's persistently <90 mL/min signify possible Chronic Kidney     Disease.  CBC     Status: Abnormal   Collection Time    07/04/13  5:20 AM      Result Value Range   WBC 4.8  4.0 - 10.5 K/uL   RBC 2.52 (*) 3.87 - 5.11 MIL/uL   Hemoglobin 6.1 (*) 12.0 - 15.0 g/dL   Comment: CRITICAL RESULT CALLED TO, READ BACK BY AND VERIFIED WITH:     HODGIN,G RN 07/04/2013 0621 JORDANS     REPEATED TO VERIFY   HCT 18.7 (*) 36.0 - 46.0 %   MCV 74.2 (*) 78.0 - 100.0 fL   MCH 24.2 (*) 26.0 - 34.0 pg   MCHC 32.6  30.0 - 36.0 g/dL   RDW 15.0  11.5 - 15.5 %   Platelets 231  150 - 400 K/uL  TYPE AND SCREEN     Status: None   Collection Time    07/04/13  9:36 AM      Result Value Range   ABO/RH(D) O NEG     Antibody Screen NEG     Sample Expiration 07/07/2013     Unit Number [id number]     Blood Component Type RED CELLS,LR     Unit division 00     Status of Unit ISSUED     Transfusion Status OK TO TRANSFUSE     Crossmatch Result Compatible    PREPARE RBC  (CROSSMATCH)     Status: None   Collection Time    07/04/13  9:36 AM      Result Value Range   Order Confirmation ORDER PROCESSED BY BLOOD BANK    ABO/RH     Status: None   Collection Time    07/04/13  9:36 AM      Result Value Range   ABO/RH(D) O NEG        Component Value Date/Time   SDES BLOOD HAND LEFT 07/03/2013 2031   SPECREQUEST BOTTLES DRAWN AEROBIC ONLY 1.5CC 07/03/2013 2031   CULT  Value: GRAM POSITIVE COCCI IN CLUSTERS Note: Gram Stain Report Called to,Read Back By and Verified With: Martinique PERKINS 07/04/13 1505 BY SMITHERSJ Performed at [facility] 07/03/2013 2031   REPTSTATUS PENDING 07/03/2013 2031   Dg Chest 2 View  07/03/2013   CLINICAL DATA:  Chest pain, fever, shortness of Breath  EXAM: CHEST  2 VIEW  COMPARISON:  06/22/2013 and 06/23/2013  FINDINGS: Cardiomediastinal silhouette is stable. Persistent multifocal bilateral nodular consolidation. Largest in right midlung measures 1.8 cm. The largest in left midlung measures 1.1 cm. Findings are consistent with persistent multifocal pneumonia or septic emboli. No pulmonary edema.  IMPRESSION: Persistent multifocal bilateral nodular consolidation. The largest in right midlung measures 1.8 cm. The largest in left midlung measures 1.1 cm. Findings are consistent with persistent multifocal pneumonia or septic emboli. Followup to assure resolution after treatment is recommended.   Electronically Signed   By: Lahoma Crocker   On: 07/03/2013 19:05   Recent Results (from the past 240 hour(s))  CULTURE, BLOOD (ROUTINE X 2)     Status: None   Collection Time    07/03/13  8:31 PM      Result Value Range Status   Specimen Description BLOOD HAND LEFT   Final   Special Requests BOTTLES DRAWN AEROBIC ONLY 1.5CC   Final   Culture  Setup Time     Final   Value: 07/04/2013 05:08     Performed at [facility]   Culture     Final   Value: GRAM POSITIVE COCCI IN CLUSTERS     Note: Gram Stain Report Called to,Read Back By and  Verified With: Martinique PERKINS 07/04/13 1505 BY SMITHERSJ     Performed at [facility]   Report Status PENDING   Incomplete  MRSA PCR SCREENING     Status: Abnormal   Collection Time    07/03/13 10:19 PM      Result Value Range Status   MRSA by PCR POSITIVE (*) NEGATIVE Final   Comment:            The GeneXpert MRSA Assay (FDA     approved for NASAL specimens     only), is one component of a     comprehensive MRSA colonization     surveillance program. It is not     intended to diagnose MRSA     infection nor to guide or     monitor treatment for     MRSA infections.     RESULT CALLED TO, READ BACK BY AND VERIFIED WITH:     Dorothy Spark (RN) [phone] 07/04/2013 L. [place]      07/04/2013, 4:58 PM     LOS: 1 day        [place] Antimicrobial Management Team Staphylococcus aureus bacteremia   Staphylococcus aureus bacteremia (SAB) is associated with a high rate of complications and mortality.  Specific aspects of clinical management are critical to optimizing the outcome of patients with SAB.  Therefore, the [facility] Antimicrobial Management Team [facility]) has initiated an intervention aimed at improving the management of SAB at [facility].  To do so, Infectious Diseases physicians are providing an evidence-based consult for the management of all patients  with SAB.     Yes No Comments  Perform follow-up blood cultures (even if the patient is afebrile) to ensure clearance of bacteremia [x]  []    Remove vascular catheter and obtain follow-up blood cultures after the removal of the catheter []  [x]    Perform echocardiography to evaluate for endocarditis (transthoracic ECHO is 40-50% sensitive, TEE is > 90% sensitive) [x]  []  Please keep in mind, that neither test can definitively EXCLUDE endocarditis, and that should clinical suspicion remain high for endocarditis the patient should then still be treated with an "endocarditis" duration of therapy = 6 weeks  Consult electrophysiologist  to evaluate implanted cardiac device (pacemaker, ICD) []  []    Ensure source control []  []  Have all abscesses been drained effectively? Have deep seeded infections (septic joints or osteomyelitis) had appropriate surgical debridement?  Investigate for "metastatic" sites of infection []  []  Does the patient have ANY symptom or physical exam finding that would suggest a deeper infection (back or neck pain that may be suggestive of vertebral osteomyelitis or epidural abscess, muscle pain that could be a symptom of pyomyositis)?  Keep in mind that for deep seeded infections MRI imaging with contrast is preferred rather than other often insensitive tests such as plain x-rays, especially early in a patient's presentation.  Change antibiotic therapy to __________________ []  []  Beta-lactam antibiotics are preferred for MSSA due to higher cure rates.   If on Vancomycin, goal trough should be 15 - 20 mcg/mL  Estimated duration of IV antibiotic therapy:   []  []  Consult case management for probably prolonged outpatient IV antibiotic therapy

## 2013-07-04 NOTE — Progress Notes (Signed)
  Echocardiogram Echocardiogram Transesophageal has been performed.  Philipp Deputy 07/04/2013, 3:58 PM

## 2013-07-04 NOTE — Progress Notes (Signed)
[facility] Progress Note [place] TEAM 1 - Stepdown ICU Team   Molly [name] [id number] DOB: May 04, 1986 DOA: 07/03/2013 PCP: No primary provider on file.  Brief narrative: 27 y.o. female who presented to the ED at [facility] after having left AMA from [facility] 5 days ago after being diagnosed with MRSA bacteremia (Multiple cultures positive see their discharge note for description, sensitive to vancomycin with MIC to vancomycin < 0.5), multiple lung abscesses suspicious of septic emboli. TEE was still pending at that time to confirm diagnosis of bacterial endocarditis. 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 -continue Vancomycin -consider ID consult for duration anbx's recs  UPDATE: 3:39 pm - TEE revealed small tricuspid valve vegetation with mild TR    Acute respiratory failure with hypoxia/known Multiple lung abscesses -was on RA until prior to TEE when she developed tachypnea- suspect anxiety but will continue O2    Hypokalemia -replete    Anemia, unspecified -after review of records from [facility] baseline is around 7.5 to 8.0 -today down to 6.1 so was given 1U PRBC today    Intravenous drug abuse, continuous (Heroin) -pt requesting detox -consult Psych for ?methadone detox -add prn Ativan until psych can begin methadone detox    Pulmonary HTN/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 -new findings on TTE at [facility]- should be clarified by TEE today   DVT prophylaxis: SCDs Code Status: Full Family Communication: Patient Disposition Plan/Expected LOS: Transfer to floor  Consultants: Cardiology Psychiatry   Procedures: TTE at [facility] 06/26/13 SUMMARY The left ventricular size is normal. There is normal left ventricular wall thickness. Left ventricular systolic function is normal. The left ventricular  wall motion is normal. The right ventricle is normal in size and function. The left atrial size is normal. Right atrial size is normal. Injection of agitated saline showed trace right-to-left interatrial shunt 8  beats after the injection; this is borderline significant. Clinical  correlation suggested. There is mild tricuspid regurgitation. Moderate pulmonary hypertension. Mild pulmonic valvular regurgitation. Trivial pericardial effusion. The inferior vena cava is mildly dilated. There is no comparison study available.  Antibiotics: Vancomycin 9/16 >>>  HPI/Subjective: Patient alert and primarily complaining of hunger noting she is n.p.o. for procedure. No current complaints of chest pain or shortness of breath.  Objective: Blood pressure 125/86, pulse 85, temperature 97.9 F (36.6 C), temperature source Axillary, resp. rate 20, height 5\' 2"  (1.575 m), weight 51.256 kg (113 lb), SpO2 99.00%.  Intake/Output Summary (Last 24 hours) at 07/04/13 1715 Last data filed at 07/04/13 1600  Gross per 24 hour  Intake 1018.33 ml  Output      0 ml  Net 1018.33 ml    Exam: General: No acute respiratory distress-quite pale in appearance Lungs: Clear to auscultation bilaterally without wheezes or crackles, RA with sats 97% noting after initial examination this morning patient developed tachypnea and mild tachycardia and is now on nasal cannula oxygen at 2 L per minute Cardiovascular: Regular rate and rhythm without murmur gallop or rub normal S1 and S2, no peripheral edema or JVD Abdomen: Nontender, nondistended, soft, bowel sounds positive, no rebound, no ascites, no appreciable mass Musculoskeletal: No significant cyanosis, clubbing of bilateral lower extremities Neurological: Alert and oriented x 3, moves all extremities x 4 without focal neurological deficits, CN 2-12 intact  Scheduled Meds:  Scheduled Meds: . [  MAR HOLD] Chlorhexidine Gluconate Cloth  6 each Topical Q0600  . [MAR HOLD]  influenza vac split quadrivalent PF  0.5 mL Intramuscular Tomorrow-1000  . [facility] HOLD] mupirocin ointment  1 application Nasal BID  . [facility] HOLD] nicotine  21 mg Transdermal Once  . [facility] HOLD] nicotine  21 mg Transdermal Daily  . [facility] HOLD] pneumococcal 23 valent vaccine  0.5 mL Intramuscular Once  . [MAR HOLD] sodium chloride  3 mL Intravenous Q12H  . [facility] HOLD] vancomycin  750 mg Intravenous Q12H   Data Reviewed: Basic Metabolic Panel:  Recent Labs Lab 07/03/13 1753 07/04/13 0330  NA 136 136  K 3.0* 3.3*  CL 99 100  CO2 31 29  GLUCOSE 109* 113*  BUN 18 13  CREATININE 0.89 0.82  CALCIUM 8.4 8.0*   Liver Function Tests:  Recent Labs Lab 07/03/13 1929  AST 13  ALT 8  ALKPHOS 117  BILITOT 0.4  PROT 7.1  ALBUMIN 2.3*   CBC:  Recent Labs Lab 07/03/13 1753 07/04/13 0520  WBC 5.6 4.8  HGB 7.7* 6.1*  HCT 23.7* 18.7*  MCV 75.0* 74.2*  PLT 252 231   BNP (last 3 results)  Recent Labs  07/03/13 1919  PROBNP 3241.0*     Recent Results (from the past 240 hour(s))  CULTURE, BLOOD (ROUTINE X 2)     Status: None   Collection Time    07/03/13  8:31 PM      Result Value Range Status   Specimen Description BLOOD HAND LEFT   Final   Special Requests BOTTLES DRAWN AEROBIC ONLY 1.5CC   Final   Culture  Setup Time     Final   Value: 07/04/2013 05:08     Performed at [facility]   Culture     Final   Value: GRAM POSITIVE COCCI IN CLUSTERS     Note: Gram Stain Report Called to,Read Back By and Verified With: [place] PERKINS 07/04/13 1505 BY SMITHERSJ     Performed at [facility]   Report Status PENDING   Incomplete  MRSA PCR SCREENING     Status: Abnormal   Collection Time    07/03/13 10:19 PM      Result Value Range Status   MRSA by PCR POSITIVE (*) NEGATIVE Final   Comment:            The GeneXpert MRSA Assay (FDA     approved for NASAL specimens     only), is one component of a     comprehensive MRSA colonization     surveillance program. It  is not     intended to diagnose MRSA     infection nor to guide or     monitor treatment for     MRSA infections.     RESULT CALLED TO, READ BACK BY AND VERIFIED WITH:     Dorothy Spark (RN) [phone] 07/04/2013 L. LOMAX     Studies:  Recent x-ray studies have been reviewed in detail by the Attending Physician    Erin Hearing, ANP [facility] Office  [phone] Pager [phone]  **If unable to reach the above provider after paging please contact the [place] @ [phone]  On-Call/Text Page:      [email or web address]      password [email or web address]  If 7PM-7AM, please contact night-coverage [email or web address] Password [email or web address] 07/04/2013, 5:15 PM   LOS: 1 day   I have personally examined this patient and reviewed the entire database. I have reviewed the  above note, made any necessary editorial changes, and agree with its content.  Cherene Altes, MD [facility]

## 2013-07-04 NOTE — Progress Notes (Signed)
SCDs ordered around 1300. SCDs have not made it to the pt. Materials management re-paged. Info passed onto nurse receiving pt.  Perkins, Molly [name]

## 2013-07-05 ENCOUNTER — Encounter (HOSPITAL_COMMUNITY): Payer: Self-pay | Admitting: Cardiology

## 2013-07-05 DIAGNOSIS — B1920 Unspecified viral hepatitis C without hepatic coma: Secondary | ICD-10-CM

## 2013-07-05 DIAGNOSIS — F1120 Opioid dependence, uncomplicated: Secondary | ICD-10-CM

## 2013-07-05 DIAGNOSIS — J9600 Acute respiratory failure, unspecified whether with hypoxia or hypercapnia: Secondary | ICD-10-CM

## 2013-07-05 DIAGNOSIS — I330 Acute and subacute infective endocarditis: Principal | ICD-10-CM

## 2013-07-05 DIAGNOSIS — I2789 Other specified pulmonary heart diseases: Secondary | ICD-10-CM

## 2013-07-05 DIAGNOSIS — E876 Hypokalemia: Secondary | ICD-10-CM

## 2013-07-05 LAB — CBC
Hemoglobin: 7.4 g/dL — ABNORMAL LOW (ref 12.0–15.0)
MCH: 25.2 pg — ABNORMAL LOW (ref 26.0–34.0)
MCV: 75.9 fL — ABNORMAL LOW (ref 78.0–100.0)
RBC: 2.94 MIL/uL — ABNORMAL LOW (ref 3.87–5.11)
WBC: 5.5 10*3/uL (ref 4.0–10.5)

## 2013-07-05 LAB — BASIC METABOLIC PANEL
BUN: 13 mg/dL (ref 6–23)
CO2: 27 mEq/L (ref 19–32)
Calcium: 7.6 mg/dL — ABNORMAL LOW (ref 8.4–10.5)
Chloride: 105 mEq/L (ref 96–112)
Creatinine, Ser: 0.75 mg/dL (ref 0.50–1.10)
GFR calc Af Amer: >90 mL/min (ref >90)
GFR calc non Af Amer: >90 mL/min (ref >90)
Glucose, Bld: 100 mg/dL — ABNORMAL HIGH (ref 70–99)
Potassium: 4 mEq/L (ref 3.5–5.1)
Sodium: 138 mEq/L (ref 135–145)

## 2013-07-05 LAB — VANCOMYCIN, TROUGH: Vancomycin Tr: 9 ug/mL — ABNORMAL LOW (ref 10.0–20.0)

## 2013-07-05 LAB — TYPE AND SCREEN
ABO/RH(D): O NEG
Antibody Screen: NEGATIVE

## 2013-07-05 MED ORDER — VANCOMYCIN HCL IN DEXTROSE 750-5 MG/150ML-% IV SOLN
750.0000 mg | Freq: Three times a day (TID) | INTRAVENOUS | Status: DC
  Administered 2013-07-06 – 2013-07-08 (×8): 750 mg via INTRAVENOUS
  Filled 2013-07-05 (×10): qty 150

## 2013-07-05 NOTE — Consult Note (Signed)
Reason for Consult: Opioid dependence  Referring Physician: Campbell Riches, MD   Molly [name] is an 27 y.o. female.  HPI: Patient is seen and chart reviewed. Patient has history of opioid abuse over three years and has been abusing opana IV over eight months. 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 She stated that her brother has teaching how use it. She has several friends who were abusing drugs. She has motivated to quit drug abuse because she has few known people died because of accidental overdose. She has been struggling with multiple lung abscesses suspicious of septic emboli. TEE was still pending at that time to confirm diagnosis of bacterial endocarditis. The cause of her endocarditis is likely due to IVDU, she admits to using Opana IV. Patient has history of being in suboxone therapy and [facility] /rehab facility in the past.   MSE: Patient is calm and cooperative. She has fine mood and bright. She has normal psychomotor activity. She has normal speech and thought process. She has denied suicidal or homicidal ideation.  She has no evidence of psychosis.  Past Medical History  Diagnosis Date  . IV drug user     Past Surgical History  Procedure Laterality Date  . Tee without cardioversion N/A 07/04/2013    Procedure: TRANSESOPHAGEAL ECHOCARDIOGRAM (TEE);  Surgeon: Larey Dresser, MD;  Location: [facility];  Service: Cardiovascular;  Laterality: N/A;    History reviewed. No pertinent family history.  Social History:  reports that she has been smoking.  She does not have any smokeless tobacco history on file. She reports that she uses illicit drugs (IV and Oxycodone). She reports that she does not drink alcohol.  Allergies: No Known Allergies  Medications: I have reviewed the patient's current medications.  Results for orders placed during the hospital encounter of 07/03/13 (from the past 48 hour(s))  CBC     Status: Abnormal   Collection  Time    07/03/13  5:53 PM      Result Value Range   WBC 5.6  4.0 - 10.5 K/uL   RBC 3.16 (*) 3.87 - 5.11 MIL/uL   Hemoglobin 7.7 (*) 12.0 - 15.0 g/dL   HCT 23.7 (*) 36.0 - 46.0 %   MCV 75.0 (*) 78.0 - 100.0 fL   MCH 24.4 (*) 26.0 - 34.0 pg   MCHC 32.5  30.0 - 36.0 g/dL   RDW 15.1  11.5 - 15.5 %   Platelets 252  150 - 400 K/uL  BASIC METABOLIC PANEL     Status: Abnormal   Collection Time    07/03/13  5:53 PM      Result Value Range   Sodium 136  135 - 145 mEq/L   Potassium 3.0 (*) 3.5 - 5.1 mEq/L   Chloride 99  96 - 112 mEq/L   CO2 31  19 - 32 mEq/L   Glucose, Bld 109 (*) 70 - 99 mg/dL   BUN 18  6 - 23 mg/dL   Creatinine, Ser 0.89  0.50 - 1.10 mg/dL   Calcium 8.4  8.4 - 10.5 mg/dL   GFR calc non Af Amer 89 (*) >90 mL/min   GFR calc Af Amer >90  >90 mL/min   Comment: (NOTE)     The eGFR has been calculated using the CKD EPI equation.     This calculation has not been validated in all clinical situations.     eGFR's persistently <90 mL/min signify possible Chronic Kidney  Disease.  PRO B NATRIURETIC PEPTIDE     Status: Abnormal   Collection Time    07/03/13  7:19 PM      Result Value Range   Pro B Natriuretic peptide (BNP) 3241.0 (*) 0 - 125 pg/mL  HEPATIC FUNCTION PANEL     Status: Abnormal   Collection Time    07/03/13  7:29 PM      Result Value Range   Total Protein 7.1  6.0 - 8.3 g/dL   Albumin 2.3 (*) 3.5 - 5.2 g/dL   AST 13  0 - 37 U/L   ALT 8  0 - 35 U/L   Alkaline Phosphatase 117  39 - 117 U/L   Total Bilirubin 0.4  0.3 - 1.2 mg/dL   Bilirubin, Direct 0.1  0.0 - 0.3 mg/dL   Indirect Bilirubin 0.3  0.3 - 0.9 mg/dL  POCT I-STAT TROPONIN I     Status: None   Collection Time    07/03/13  7:44 PM      Result Value Range   Troponin i, poc 0.00  0.00 - 0.08 ng/mL   Comment 3            Comment: Due to the release kinetics of cTnI,     a negative result within the first hours     of the onset of symptoms does not rule out     myocardial infarction with  certainty.     If myocardial infarction is still suspected,     repeat the test at appropriate intervals.  CULTURE, BLOOD (ROUTINE X 2)     Status: None   Collection Time    07/03/13  8:25 PM      Result Value Range   Specimen Description BLOOD HAND RIGHT     Special Requests BOTTLES DRAWN AEROBIC ONLY 0.5CC     Culture  Setup Time       Value: 07/04/2013 05:08     Performed at [facility]   Culture       Value: [place] IN CLUSTERS     Note: Gram Stain Report Called to,Read Back By and Verified With: KAMI MOORE ON 07/04/2013 AT 8:43P BY WILEJ     Performed at [facility]   Report Status PENDING    CULTURE, BLOOD (ROUTINE X 2)     Status: None   Collection Time    07/03/13  8:31 PM      Result Value Range   Specimen Description BLOOD HAND LEFT     Special Requests BOTTLES DRAWN AEROBIC ONLY 1.5CC     Culture  Setup Time       Value: 07/04/2013 05:08     Performed at [facility]   Culture       Value: [place] IN CLUSTERS     Note: Gram Stain Report Called to,Read Back By and Verified With: Martinique PERKINS 07/04/13 1505 BY SMITHERSJ     Performed at [facility]   Report Status PENDING    MRSA PCR SCREENING     Status: Abnormal   Collection Time    07/03/13 10:19 PM      Result Value Range   MRSA by PCR POSITIVE (*) NEGATIVE   Comment:            The GeneXpert MRSA Assay (FDA     approved for NASAL specimens     only), is one component of a  comprehensive MRSA colonization     surveillance program. It is not     intended to diagnose MRSA     infection nor to guide or     monitor treatment for     MRSA infections.     RESULT CALLED TO, READ BACK BY AND VERIFIED WITH:     Dorothy Spark (RN) [phone] 07/04/2013 L. [facility]OM[facility]  BASIC METABOLIC PANEL     Status: Abnormal   Collection Time    07/04/13  3:30 AM      Result Value Range   Sodium 136  135 - 145 mEq/L   Potassium 3.3 (*) 3.5 - 5.1 mEq/L   Chloride 100  96 - 112 mEq/L    CO2 29  19 - 32 mEq/L   Glucose, Bld 113 (*) 70 - 99 mg/dL   BUN 13  6 - 23 mg/dL   Creatinine, Ser 0.82  0.50 - 1.10 mg/dL   Calcium 8.0 (*) 8.4 - 10.5 mg/dL   GFR calc non Af Amer >90  >90 mL/min   GFR calc Af Amer >90  >90 mL/min   Comment: (NOTE)     The eGFR has been calculated using the CKD EPI equation.     This calculation has not been validated in all clinical situations.     eGFR's persistently <90 mL/min signify possible Chronic Kidney     Disease.  CULTURE, BLOOD (SINGLE)     Status: None   Collection Time    07/04/13  3:30 AM      Result Value Range   Specimen Description BLOOD LEFT ARM     Special Requests BOTTLES DRAWN AEROBIC AND ANAEROBIC 10CC     Culture  Setup Time       Value: 07/04/2013 10:30     Performed at [facility]   Culture       Value: [place] IN CLUSTERS     Note: Gram Stain Report Called to,Read Back By and Verified With: ASHLEY Norcia 07/05/13 0850 BY SMITHERSJ     Performed at [facility]   Report Status PENDING    CBC     Status: Abnormal   Collection Time    07/04/13  5:20 AM      Result Value Range   WBC 4.8  4.0 - 10.5 K/uL   RBC 2.52 (*) 3.87 - 5.11 MIL/uL   Hemoglobin 6.1 (*) 12.0 - 15.0 g/dL   Comment: CRITICAL RESULT CALLED TO, READ BACK BY AND VERIFIED WITH:     HODGIN,G RN 07/04/2013 0621 JORDANS     REPEATED TO VERIFY   HCT 18.7 (*) 36.0 - 46.0 %   MCV 74.2 (*) 78.0 - 100.0 fL   MCH 24.2 (*) 26.0 - 34.0 pg   MCHC 32.6  30.0 - 36.0 g/dL   RDW 15.0  11.5 - 15.5 %   Platelets 231  150 - 400 K/uL  TYPE AND SCREEN     Status: None   Collection Time    07/04/13  9:36 AM      Result Value Range   ABO/RH(D) O NEG     Antibody Screen NEG     Sample Expiration 07/07/2013     Unit Number [id number]     Blood Component Type RED CELLS,LR     Unit division 00     Status of Unit ISSUED     Transfusion Status OK TO TRANSFUSE     Crossmatch Result Compatible  PREPARE RBC (CROSSMATCH)     Status: None    Collection Time    07/04/13  9:36 AM      Result Value Range   Order Confirmation ORDER PROCESSED BY BLOOD BANK    ABO/RH     Status: None   Collection Time    07/04/13  9:36 AM      Result Value Range   ABO/RH(D) O NEG    CBC     Status: Abnormal   Collection Time    07/05/13  5:02 AM      Result Value Range   WBC 5.5  4.0 - 10.5 K/uL   RBC 2.94 (*) 3.87 - 5.11 MIL/uL   Hemoglobin 7.4 (*) 12.0 - 15.0 g/dL   Comment: POST TRANSFUSION SPECIMEN   HCT 22.3 (*) 36.0 - 46.0 %   MCV 75.9 (*) 78.0 - 100.0 fL   MCH 25.2 (*) 26.0 - 34.0 pg   MCHC 33.2  30.0 - 36.0 g/dL   RDW 15.3  11.5 - 15.5 %   Platelets 264  150 - 400 K/uL  BASIC METABOLIC PANEL     Status: Abnormal   Collection Time    07/05/13  5:02 AM      Result Value Range   Sodium 138  135 - 145 mEq/L   Potassium 4.0  3.5 - 5.1 mEq/L   Comment: DELTA CHECK NOTED   Chloride 105  96 - 112 mEq/L   CO2 27  19 - 32 mEq/L   Glucose, Bld 100 (*) 70 - 99 mg/dL   BUN 13  6 - 23 mg/dL   Creatinine, Ser 0.75  0.50 - 1.10 mg/dL   Calcium 7.6 (*) 8.4 - 10.5 mg/dL   GFR calc non Af Amer >90  >90 mL/min   GFR calc Af Amer >90  >90 mL/min   Comment: (NOTE)     The eGFR has been calculated using the CKD EPI equation.     This calculation has not been validated in all clinical situations.     eGFR's persistently <90 mL/min signify possible Chronic Kidney     Disease.    Dg Chest 2 View  07/03/2013   CLINICAL DATA:  Chest pain, fever, shortness of Breath  EXAM: CHEST  2 VIEW  COMPARISON:  06/22/2013 and 06/23/2013  FINDINGS: Cardiomediastinal silhouette is stable. Persistent multifocal bilateral nodular consolidation. Largest in right midlung measures 1.8 cm. The largest in left midlung measures 1.1 cm. Findings are consistent with persistent multifocal pneumonia or septic emboli. No pulmonary edema.  IMPRESSION: Persistent multifocal bilateral nodular consolidation. The largest in right midlung measures 1.8 cm. The largest in left  midlung measures 1.1 cm. Findings are consistent with persistent multifocal pneumonia or septic emboli. Followup to assure resolution after treatment is recommended.   Electronically Signed   By: Lahoma Crocker   On: 07/03/2013 19:05    Positive for anxiety, bad mood and illegal drug usage Blood pressure 104/67, pulse 92, temperature 99.8 F (37.7 C), temperature source Oral, resp. rate 22, height 5\' 2"  (1.575 m), weight 52.3 kg (115 lb 4.8 oz), SpO2 95.00%.   Assessment/Plan: Opioid dependence  Recommendation: Patient does not meet criteria for opioid detox treatment due to no significant withdrawal symptoms Patient will be referred to residential chemical dependency rehabilitation program Refer to psych social service for locally available programs Appreciate psych consult and will sign off at this time.   Luka Reisch,JANARDHAHA R. 07/05/2013, 8:57 AM

## 2013-07-05 NOTE — Progress Notes (Signed)
CRITICAL VALUE ALERT  Critical value received: gram positive cocci in clusters in aerobic bottle of blood cultures  Date of notification:  07/05/13   Time of notification:  08:50  Critical value read back:yes  Nurse who received alert:  Virgilio Frees   MD notified (1st page):  Wendee Beavers  Time of first page:  08:50  MD notified (2nd page):  Time of second page:  Responding MD:  Wendee Beavers  Time MD responded:  08:51

## 2013-07-05 NOTE — Progress Notes (Signed)
ANTIBIOTIC CONSULT NOTE - Follow Up  Pharmacy Consult:  Vancomycin Indication:  Endocarditis   No Known Allergies  Patient Measurements: Height: 5\' 2"  (157.5 cm) Weight: 117 lb 3.2 oz (53.162 kg) IBW/kg (Calculated) : 50.1  Vital Signs: Temp: 100.4 F (38 C) (09/18 2027) BP: 120/81 mmHg (09/18 2027) Pulse Rate: 73 (09/18 2027)  Labs:  Recent Labs  07/03/13 1753 07/04/13 0330 07/04/13 0520 07/05/13 0502  WBC 5.6  --  4.8 5.5  HGB 7.7*  --  6.1* 7.4*  PLT 252  --  231 264  CREATININE 0.89 0.82  --  0.75   Estimated Creatinine Clearance: 84.3 ml/min (by C-G formula based on Cr of 0.75).  Recent Labs  07/05/13 2230  [place] 9.0*     Microbiology: Recent Results (from the past 720 hour(s))  CULTURE, BLOOD (ROUTINE X 2)     Status: None   Collection Time    07/03/13  8:25 PM      Result Value Range Status   Specimen Description BLOOD HAND RIGHT   Final   Special Requests BOTTLES DRAWN AEROBIC ONLY 0.5CC   Final   Culture  Setup Time     Final   Value: 07/04/2013 05:08     Performed at [facility]   Culture     Final   Value: METHICILLIN RESISTANT STAPHYLOCOCCUS AUREUS     Note: RIFAMPIN AND GENTAMICIN SHOULD NOT BE USED AS SINGLE DRUGS FOR TREATMENT OF STAPH INFECTIONS. CRITICAL RESULT CALLED TO, READ BACK BY AND VERIFIED WITH: ASHLEY LARSON 07/05/13 1425 BY SMITHERSJ     Note: Gram Stain Report Called to,Read Back By and Verified With: KAMI MOORE ON 07/04/2013 AT 8:43P BY WILEJ     Performed at [facility]   Report Status PENDING   Incomplete  CULTURE, BLOOD (ROUTINE X 2)     Status: None   Collection Time    07/03/13  8:31 PM      Result Value Range Status   Specimen Description BLOOD HAND LEFT   Final   Special Requests BOTTLES DRAWN AEROBIC ONLY 1.5CC   Final   Culture  Setup Time     Final   Value: 07/04/2013 05:08     Performed at [facility]   Culture     Final   Value: STAPHYLOCOCCUS AUREUS     Note: Gram Stain Report  Called to,Read Back By and Verified With: Martin[place] PERKINS 07/04/13 1505 BY SMITHERSJ     Performed at [facility]   Report Status PENDING   Incomplete  MRSA PCR SCREENING     Status: Abnormal   Collection Time    07/03/13 10:19 PM      Result Value Range Status   MRSA by PCR POSITIVE (*) NEGATIVE Final   Comment:            The GeneXpert MRSA Assay (FDA     approved for NASAL specimens     only), is one component of a     comprehensive MRSA colonization     surveillance program. It is not     intended to diagnose MRSA     infection nor to guide or     monitor treatment for     MRSA infections.     RESULT CALLED TO, READ BACK BY AND VERIFIED WITH:     Dorothy Spark (RN) [phone] 07/04/2013 L. [facility]  CULTURE, BLOOD (SINGLE)     Status: None   Collection Time  07/04/13  3:30 AM      Result Value Range Status   Specimen Description BLOOD LEFT ARM   Final   Special Requests BOTTLES DRAWN AEROBIC AND ANAEROBIC 10CC   Final   Culture  Setup Time     Final   Value: 07/04/2013 10:30     Performed at [facility]   Culture     Final   Value: GRAM POSITIVE COCCI IN CLUSTERS     Note: Gram Stain Report Called to,Read Back By and Verified With: Ancil Boozer 07/05/13 0850 BY SMITHERSJ     Performed at [facility]   Report Status PENDING   Incomplete    Medical History: Past Medical History  Diagnosis Date  . IV drug user        Assessment: 39 YOF with history of IVDU recently admitted to [facility] with diagnosis of endocarditis and PNA (last week).  Patient left AMA and presented to [facility]  07/03/14.  Pharmacy asked to start IV vancomycin.  Vancomycin trough is 9.0 mcg/mL.   Goal of Therapy:  Vancomycin trough level 15-20 mcg/ml   Plan:  - Change vancomycin to 750mg  IV Q8 - Monitor renal fxn, C/S, repeat VT at [place], PharmD Pager:  [phone] 07/05/2013, 11:48 PM

## 2013-07-05 NOTE — Progress Notes (Signed)
[facility] PROGRESS NOTE  Molly [name] [id number] DOB: Aug 18, 1986 DOA: 07/03/2013 PCP: No primary provider on file.  Assessment/Plan:  MRSA bacteremia with tricuspid valve endocarditis  -known prior dx  -continue Vancomycin, ID on board - TEE revealed small tricuspid valve vegetation with mild TR   Acute respiratory failure with hypoxia/known Multiple lung abscesses  -Resolved and on room air.  Hypokalemia  -resolved after repletion  Anemia, unspecified  -after review of [facility] from [facility] baseline is around 7.5 to 8.0  - s/p 1 unit of PRBC  Intravenous drug abuse, continuous (Heroin)  -consulted Psych for ?methadone detox and evaluation pending. -add prn Ativan until psych can begin methadone detox if psych agrees to take on patient.  Pulmonary HTN/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  -No defect in atrial septum no right to left atrial level shunt  Code Status: full Family Communication: no family members at bedside Disposition Plan: Placement either to inpatient psych for detox or SNF. Will need picc line placed   Consultants:  ID: Dr. Johnnye Sima  Procedures:  TEE  Antibiotics:  Vancomycin   HPI/Subjective: No new complaints. No acute issues overnight.  Objective: Filed Vitals:   07/05/13 1100  BP: 122/81  Pulse: 80  Temp: 99 F (37.2 C)  Resp: 20    Intake/Output Summary (Last 24 hours) at 07/05/13 1411 Last data filed at 07/05/13 0951  Gross per 24 hour  Intake    600 ml  Output      0 ml  Net    600 ml   Filed Weights   07/03/13 2030 07/04/13 2057  Weight: 51.256 kg (113 lb) 52.3 kg (115 lb 4.8 oz)    Exam:   General:  Pt in NAD, Alert and Awake  Cardiovascular: RRR, no cyanotic extremities  Respiratory: CTA BL, no wheezes  Abdomen: soft, NT, ND  Musculoskeletal: no cyanosis or clubbing   Data Reviewed: Basic Metabolic Panel:  Recent Labs Lab 07/03/13 1753 07/04/13 0330 07/05/13 0502  NA 136 136 138  K 3.0* 3.3*  4.0  CL 99 100 105  CO2 31 29 27   GLUCOSE 109* 113* 100*  BUN 18 13 13   CREATININE 0.89 0.82 0.75  CALCIUM 8.4 8.0* 7.6*   Liver Function Tests:  Recent Labs Lab 07/03/13 1929  AST 13  ALT 8  ALKPHOS 117  BILITOT 0.4  PROT 7.1  ALBUMIN 2.3*   No results found for this basename: LIPASE, AMYLASE,  in the last 168 hours No results found for this basename: AMMONIA,  in the last 168 hours CBC:  Recent Labs Lab 07/03/13 1753 07/04/13 0520 07/05/13 0502  WBC 5.6 4.8 5.5  HGB 7.7* 6.1* 7.4*  HCT 23.7* 18.7* 22.3*  MCV 75.0* 74.2* 75.9*  PLT 252 231 264   Cardiac Enzymes: No results found for this basename: CKTOTAL, CKMB, CKMBINDEX, TROPONINI,  in the last 168 hours BNP (last 3 results)  Recent Labs  07/03/13 1919  PROBNP 3241.0*   CBG: No results found for this basename: GLUCAP,  in the last 168 hours  Recent Results (from the past 240 hour(s))  CULTURE, BLOOD (ROUTINE X 2)     Status: None   Collection Time    07/03/13  8:25 PM      Result Value Range Status   Specimen Description BLOOD HAND RIGHT   Final   Special Requests BOTTLES DRAWN AEROBIC ONLY 0.5CC   Final   Culture  Setup Time     Final   Value:  07/04/2013 05:08     Performed at [facility]   Culture     Final   Value: STAPHYLOCOCCUS AUREUS     Note: RIFAMPIN AND GENTAMICIN SHOULD NOT BE USED AS SINGLE DRUGS FOR TREATMENT OF STAPH INFECTIONS.     Note: Gram Stain Report Called to,Read Back By and Verified With: KAMI MOORE ON 07/04/2013 AT 8:43P BY WILEJ     Performed at [facility]   Report Status PENDING   Incomplete  CULTURE, BLOOD (ROUTINE X 2)     Status: None   Collection Time    07/03/13  8:31 PM      Result Value Range Status   Specimen Description BLOOD HAND LEFT   Final   Special Requests BOTTLES DRAWN AEROBIC ONLY 1.5CC   Final   Culture  Setup Time     Final   Value: 07/04/2013 05:08     Performed at [facility]   Culture     Final   Value: STAPHYLOCOCCUS  AUREUS     Note: Gram Stain Report Called to,Read Back By and Verified With: [place] PERKINS 07/04/13 1505 BY SMITHERSJ     Performed at [facility]   Report Status PENDING   Incomplete  MRSA PCR SCREENING     Status: Abnormal   Collection Time    07/03/13 10:19 PM      Result Value Range Status   MRSA by PCR POSITIVE (*) NEGATIVE Final   Comment:            The GeneXpert MRSA Assay (FDA     approved for NASAL specimens     only), is one component of a     comprehensive MRSA colonization     surveillance program. It is not     intended to diagnose MRSA     infection nor to guide or     monitor treatment for     MRSA infections.     RESULT CALLED TO, READ BACK BY AND VERIFIED WITH:     Dorothy Spark (RN) [phone] 07/04/2013 [facility]  CULTURE, BLOOD (SINGLE)     Status: None   Collection Time    07/04/13  3:30 AM      Result Value Range Status   Specimen Description BLOOD LEFT ARM   Final   Special Requests BOTTLES DRAWN AEROBIC AND ANAEROBIC 10CC   Final   Culture  Setup Time     Final   Value: 07/04/2013 10:30     Performed at [facility]   Culture     Final   Value: GRAM POSITIVE COCCI IN CLUSTERS     Note: Gram Stain Report Called to,Read Back By and Verified With: ASHLEY Signore 07/05/13 0850 BY SMITHERSJ     Performed at [facility]   Report Status PENDING   Incomplete     Studies: Dg Chest 2 View  07/03/2013   CLINICAL DATA:  Chest pain, fever, shortness of Breath  EXAM: CHEST  2 VIEW  COMPARISON:  06/22/2013 and 06/23/2013  FINDINGS: Cardiomediastinal silhouette is stable. Persistent multifocal bilateral nodular consolidation. Largest in right midlung measures 1.8 cm. The largest in left midlung measures 1.1 cm. Findings are consistent with persistent multifocal pneumonia or septic emboli. No pulmonary edema.  IMPRESSION: Persistent multifocal bilateral nodular consolidation. The largest in right midlung measures 1.8 cm. The largest in left midlung measures  1.1 cm. Findings are consistent with persistent multifocal pneumonia or septic emboli. Followup  to assure resolution after treatment is recommended.   Electronically Signed   By: Lahoma Crocker   On: 07/03/2013 19:05    Scheduled Meds: . Chlorhexidine Gluconate Cloth  6 each Topical Q0600  . influenza vac split quadrivalent PF  0.5 mL Intramuscular Tomorrow-1000  . mupirocin ointment  1 application Nasal BID  . [COMPLETED] nicotine  21 mg Transdermal Once  . nicotine  21 mg Transdermal Daily  . [START ON 07/06/2013] pneumococcal 23 valent vaccine  0.5 mL Intramuscular Once  . sodium chloride  3 mL Intravenous Q12H  . vancomycin  750 mg Intravenous Q12H   Continuous Infusions: . 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 If 7PM-7AM, please contact night-coverage at [email or web address], password [facility] 07/05/2013, 2:11 PM  LOS: 2 days

## 2013-07-05 NOTE — Progress Notes (Signed)
INFECTIOUS DISEASE PROGRESS NOTE  ID: Molly [name] is a 27 y.o. 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   Hepatitis C  Subjective: Without complaints, pain better controlled  Abtx:  Anti-infectives   Start     Dose/Rate Route Frequency Ordered Stop   07/04/13 1000  vancomycin (VANCOCIN) IVPB 750 mg/150 ml premix  Status:  Discontinued     750 mg 150 mL/hr over 60 Minutes Intravenous Every 12 hours 07/03/13 2106 07/03/13 2159   07/04/13 0900  vancomycin (VANCOCIN) IVPB 750 mg/150 ml premix     750 mg 150 mL/hr over 60 Minutes Intravenous Every 12 hours 07/03/13 2159     07/03/13 2115  vancomycin (VANCOCIN) IVPB 1000 mg/200 mL premix     1,000 mg 200 mL/hr over 60 Minutes Intravenous  Once 07/03/13 2106 07/03/13 2211      Medications:  Scheduled: . Chlorhexidine Gluconate Cloth  6 each Topical Q0600  . influenza vac split quadrivalent PF  0.5 mL Intramuscular Tomorrow-1000  . mupirocin ointment  1 application Nasal BID  . [COMPLETED] nicotine  21 mg Transdermal Once  . nicotine  21 mg Transdermal Daily  . [START ON 07/06/2013] pneumococcal 23 valent vaccine  0.5 mL Intramuscular Once  . sodium chloride  3 mL Intravenous Q12H  . vancomycin  750 mg Intravenous Q12H    Objective: Vital signs in last 24 hours: Temp:  [97.9 F (36.6 C)-100.8 F (38.2 C)] 99 F (37.2 C) (09/18 1100) Pulse Rate:  [80-104] 80 (09/18 1100) Resp:  [18-24] 20 (09/18 1100) BP: (104-125)/(64-86) 122/81 mmHg (09/18 1100) SpO2:  [95 %-100 %] 100 % (09/18 1100) Weight:  [52.3 kg (115 lb 4.8 oz)] 52.3 kg (115 lb 4.8 oz) (09/17 2057)   General appearance: alert, cooperative and no distress Resp: clear to auscultation bilaterally Cardio: regular  rate and rhythm GI: normal findings: bowel sounds normal and soft, non-tender  Lab Results  Recent Labs  07/04/13 0330 07/04/13 0520 07/05/13 0502  WBC  --  4.8 5.5  HGB  --  6.1* 7.4*  HCT  --  18.7* 22.3*  NA 136  --  138  K 3.3*  --  4.0  CL 100  --  105  CO2 29  --  27  BUN 13  --  13  CREATININE 0.82  --  0.75   Liver Panel  Recent Labs  07/03/13 1929  PROT 7.1  ALBUMIN 2.3*  AST 13  ALT 8  ALKPHOS 117  BILITOT 0.4  BILIDIR 0.1  IBILI 0.3   Sedimentation Rate No results found for this basename: ESRSEDRATE,  in the last 72 hours C-Reactive Protein No results found for this basename: CRP,  in the last 72 hours  Microbiology: Recent Results (from the past 240 hour(s))  CULTURE, BLOOD (ROUTINE X 2)     Status: None   Collection Time    07/03/13  8:25 PM      Result Value Range Status   Specimen Description BLOOD HAND RIGHT   Final   Special Requests BOTTLES DRAWN AEROBIC ONLY 0.5CC   Final   Culture  Setup Time     Final   Value: 07/04/2013 05:08     Performed at [facility]   Culture     Final   Value: METHICILLIN RESISTANT STAPHYLOCOCCUS AUREUS     Note: RIFAMPIN AND GENTAMICIN SHOULD NOT BE USED AS SINGLE DRUGS FOR TREATMENT OF STAPH INFECTIONS. CRITICAL RESULT CALLED TO, READ BACK BY AND VERIFIED WITH: ASHLEY LARSON 07/05/13 1425 BY SMITHERSJ     Note: Gram Stain Report Called to,Read Back By and Verified With: KAMI MOORE ON 07/04/2013 AT 8:43P BY WILEJ     Performed at [facility]   Report Status PENDING   Incomplete  CULTURE, BLOOD (ROUTINE X 2)     Status: None   Collection Time    07/03/13  8:31 PM      Result Value Range Status   Specimen Description BLOOD HAND LEFT   Final   Special Requests BOTTLES DRAWN AEROBIC ONLY 1.5CC   Final   Culture  Setup Time     Final   Value: 07/04/2013 05:08     Performed at [facility]   Culture     Final   Value: STAPHYLOCOCCUS AUREUS     Note: Gram Stain Report Called to,Read Back  By and Verified With: Martinique PERKINS 07/04/13 1505 BY SMITHERSJ     Performed at [facility]   Report Status PENDING   Incomplete  MRSA PCR SCREENING     Status: Abnormal   Collection Time    07/03/13 10:19 PM      Result Value Range Status   MRSA by PCR POSITIVE (*) NEGATIVE Final   Comment:            The GeneXpert MRSA Assay (FDA     approved for NASAL specimens     only), is one component of a     comprehensive MRSA colonization     surveillance program. It is not     intended to diagnose MRSA     infection nor to guide or     monitor treatment for     MRSA infections.     RESULT CALLED TO, READ BACK BY AND VERIFIED WITH:     Dorothy Spark (RN) [phone] 07/04/2013 [facility]  CULTURE, BLOOD (SINGLE)     Status: None   Collection Time    07/04/13  3:30 AM      Result Value Range Status   Specimen Description BLOOD LEFT ARM   Final   Special Requests BOTTLES DRAWN AEROBIC AND ANAEROBIC 10CC   Final   Culture  Setup Time     Final   Value: 07/04/2013 10:30     Performed at [facility]   Culture     Final   Value: GRAM POSITIVE COCCI IN CLUSTERS     Note: Gram Stain Report Called to,Read Back By and Verified With: ASHLEY Ganger 07/05/13 0850 BY SMITHERSJ     Performed at [facility]   Report Status PENDING   Incomplete    Studies/Results: Dg Chest 2 View  07/03/2013   CLINICAL DATA:  Chest pain, fever, shortness of Breath  EXAM: CHEST  2 VIEW  COMPARISON:  06/22/2013 and 06/23/2013  FINDINGS: Cardiomediastinal silhouette is stable. Persistent multifocal bilateral nodular consolidation. Largest in right midlung measures 1.8 cm. The largest in left midlung measures 1.1 cm. Findings are consistent with persistent multifocal pneumonia or septic emboli. No pulmonary edema.  IMPRESSION: Persistent multifocal bilateral nodular consolidation. The largest in right midlung measures 1.8 cm. The largest in left midlung measures 1.1 cm. Findings are consistent with  persistent  multifocal pneumonia or septic emboli. Followup to assure resolution after treatment is recommended.   Electronically Signed   By: Lahoma Crocker   On: 07/03/2013 19:05     Assessment/Plan: MRSA TV endocarditis  Hep C  Total days of antibiotics: 3 vanco  Await HIV RNA and Hep C RNA She needs placement, will consult SW.  BCx 9-17 was + will repeat         Bobby Rumpf Infectious Diseases (pager) [phone] [email or web address] 07/05/2013, 4:17 PM  LOS: 2 days

## 2013-07-06 LAB — CBC
Hemoglobin: 7.1 g/dL — ABNORMAL LOW (ref 12.0–15.0)
MCH: 25.1 pg — ABNORMAL LOW (ref 26.0–34.0)
MCV: 77.4 fL — ABNORMAL LOW (ref 78.0–100.0)
Platelets: 280 10*3/uL (ref 150–400)
RBC: 2.83 MIL/uL — ABNORMAL LOW (ref 3.87–5.11)

## 2013-07-06 LAB — CULTURE, BLOOD (ROUTINE X 2)

## 2013-07-06 LAB — CULTURE, BLOOD (SINGLE)

## 2013-07-06 NOTE — Progress Notes (Signed)
[facility] PROGRESS NOTE  Molly [name] [id number] DOB: Mar 12, 1986 DOA: 07/03/2013 PCP: No primary provider on file.  Assessment/Plan:  MRSA bacteremia with tricuspid valve endocarditis  -known prior dx  -continue Vancomycin, ID on board - TEE revealed small tricuspid valve vegetation with mild TR  - Pt will need PICC line placed for prolonged IV antibiotic administration. Blood cultures repeated since last blood cultures on 9/17 were positive.  Acute respiratory failure with hypoxia/known Multiple lung abscesses  -Resolved and on room air.  Hypokalemia  -resolved after repletion  Anemia, unspecified  -after review of records from [facility] baseline is around 7.5 to 8.0  - s/p 1 unit of PRBC - reassess next am.  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 Johnnye Sima  Procedures:  TEE  Antibiotics:  Vancomycin   HPI/Subjective: No new complaints. No acute issues overnight.  Objective: Filed Vitals:   07/06/13 0959  BP: 107/59  Pulse: 87  Temp: 98.8 F (37.1 C)  Resp: 17    Intake/Output Summary (Last 24 hours) at 07/06/13 1215 Last data filed at 07/06/13 1000  Gross per 24 hour  Intake   1070 ml  Output      0 ml  Net   1070 ml   Filed Weights   07/03/13 2030 07/04/13 2057 07/05/13 2037  Weight: 51.256 kg (113 lb) 52.3 kg (115 lb 4.8 oz) 53.162 kg (117 lb 3.2 oz)     Exam:   General:  Pt in NAD, Alert and Awake  Cardiovascular: RRR, no cyanotic extremities  Respiratory: CTA BL, no wheezes  Abdomen: soft, NT, ND  Musculoskeletal: no cyanosis or clubbing   Data Reviewed: Basic Metabolic Panel:  Recent Labs Lab 07/03/13 1753 07/04/13 0330 07/05/13 0502  NA 136 136 138  K 3.0* 3.3* 4.0  CL 99 100 105  CO2 31 29 27   GLUCOSE 109* 113* 100*  BUN 18 13 13   CREATININE 0.89 0.82 0.75  CALCIUM 8.4 8.0* 7.6*   Liver Function Tests:  Recent Labs Lab 07/03/13 1929  AST 13  ALT 8  ALKPHOS 117  BILITOT 0.4  PROT 7.1  ALBUMIN 2.3*   No results found for this basename: LIPASE, AMYLASE,  in the last 168 hours No results found for this basename: AMMONIA,  in the last 168 hours CBC:  Recent Labs Lab 07/03/13 1753 07/04/13 0520 07/05/13 0502 07/06/13 0640  WBC 5.6 4.8 5.5 5.1  HGB 7.7* 6.1* 7.4* 7.1*  HCT 23.7* 18.7* 22.3* 21.9*  MCV 75.0* 74.2* 75.9* 77.4*  PLT 252 231 264 280   Cardiac Enzymes: No results found for this basename: CKTOTAL, CKMB, CKMBINDEX, TROPONINI,  in the last 168 hours BNP (last 3 results)  Recent Labs  07/03/13 1919  PROBNP 3241.0*   CBG: No results found for this basename: GLUCAP,  in the last 168 hours  Recent Results (from the past 240 hour(s))  CULTURE, BLOOD (  ROUTINE X 2)     Status: None   Collection Time    07/03/13  8:25 PM      Result Value Range Status   Specimen Description BLOOD HAND RIGHT   Final   Special Requests BOTTLES DRAWN AEROBIC ONLY 0.5CC   Final   Culture  Setup Time     Final   Value: 07/04/2013 05:08     Performed at [facility]   Culture     Final   Value: METHICILLIN RESISTANT STAPHYLOCOCCUS AUREUS     Note: RIFAMPIN AND GENTAMICIN SHOULD NOT BE USED AS SINGLE DRUGS FOR TREATMENT OF STAPH INFECTIONS. This organism DOES NOT demonstrate inducible Clindamycin resistance in vitro. CRITICAL RESULT CALLED TO, READ BACK BY AND VERIFIED WITH: ASHLEY LARSON       07/05/13 1425 BY SMITHERSJ     Note: Gram Stain Report Called to,Read Back By and Verified With: KAMI MOORE ON 07/04/2013 AT 8:43P BY WILEJ     Performed at [facility]   Report Status 07/06/2013 FINAL   Final   Organism ID, Bacteria METHICILLIN RESISTANT STAPHYLOCOCCUS AUREUS   Final  CULTURE, BLOOD (ROUTINE X 2)     Status: None   Collection Time    07/03/13  8:31 PM      Result Value Range Status   Specimen Description BLOOD HAND LEFT   Final   Special Requests BOTTLES DRAWN AEROBIC ONLY 1.5CC   Final   Culture  Setup Time     Final   Value: 07/04/2013 05:08     Performed at [facility]   Culture     Final   Value: STAPHYLOCOCCUS AUREUS     Note: SUSCEPTIBILITIES PERFORMED ON PREVIOUS CULTURE WITHIN THE LAST 5 DAYS.     Note: Gram Stain Report Called to,Read Back By and Verified With: Martinique PERKINS 07/04/13 1505 BY SMITHERSJ     Performed at [facility]   Report Status 07/06/2013 FINAL   Final  MRSA PCR SCREENING     Status: Abnormal   Collection Time    07/03/13 10:19 PM      Result Value Range Status   MRSA by PCR POSITIVE (*) NEGATIVE Final   Comment:            The GeneXpert MRSA Assay (FDA     approved for NASAL specimens     only), is one component of a     comprehensive MRSA colonization     surveillance program. It is not     intended to diagnose MRSA     infection nor to guide or     monitor treatment for     MRSA infections.     RESULT CALLED TO, READ BACK BY AND VERIFIED WITH:     Dorothy Spark (RN) [phone] 07/04/2013 [facility]  CULTURE, BLOOD (SINGLE)     Status: None   Collection Time    07/04/13  3:30 AM      Result Value Range Status   Specimen Description BLOOD LEFT ARM   Final   Special Requests BOTTLES DRAWN AEROBIC AND ANAEROBIC 10CC   Final   Culture  Setup Time     Final   Value: 07/04/2013 10:30     Performed at [facility]   Culture     Final   Value: STAPHYLOCOCCUS AUREUS     Note: SUSCEPTIBILITIES PERFORMED ON  PREVIOUS CULTURE WITHIN THE LAST 5 DAYS.     Note: Gram  Stain Report Called to,Read Back By and Verified With: ASHLEY Sime 07/05/13 0850 BY SMITHERSJ     Performed at [facility]   Report Status 07/06/2013 FINAL   Final  CULTURE, BLOOD (ROUTINE X 2)     Status: None   Collection Time    07/04/13  7:48 PM      Result Value Range Status   Specimen Description BLOOD RIGHT FOREARM   Final   Special Requests BOTTLES DRAWN AEROBIC ONLY 5CC   Final   Culture  Setup Time     Final   Value: 07/05/2013 05:12     Performed at [facility]   Culture     Final   Value:        BLOOD CULTURE RECEIVED NO GROWTH TO DATE CULTURE WILL BE HELD FOR 5 DAYS BEFORE ISSUING A FINAL NEGATIVE REPORT     Performed at [facility]   Report Status PENDING   Incomplete  CULTURE, BLOOD (ROUTINE X 2)     Status: None   Collection Time    07/05/13  5:22 PM      Result Value Range Status   Specimen Description BLOOD LEFT HAND   Final   Special Requests BOTTLES DRAWN AEROBIC ONLY 3CC   Final   Culture  Setup Time     Final   Value: 07/05/2013 22:38     Performed at [facility]   Culture     Final   Value:        BLOOD CULTURE RECEIVED NO GROWTH TO DATE CULTURE WILL BE HELD FOR 5 DAYS BEFORE ISSUING A FINAL NEGATIVE REPORT     Performed at [facility]   Report Status PENDING   Incomplete     Studies: No results found.  Scheduled Meds: . Chlorhexidine Gluconate Cloth  6 each Topical Q0600  . mupirocin ointment  1 application Nasal BID  . nicotine  21 mg Transdermal Daily  . sodium chloride  3 mL Intravenous Q12H  . vancomycin  750 mg Intravenous Q8H   Continuous Infusions: . sodium chloride 1 mL (07/04/13 1838)    Principal Problem:   Bacterial endocarditis Active Problems:   Multiple lung abscesses   Intravenous drug abuse, continuous   MRSA bacteremia   Hypokalemia   Acute respiratory failure with hypoxia   Anemia, unspecified   Pulmonary HTN   Right to  left intra atrial shunt   Hepatitis C    Time spent: > 35 minutes    Velvet Bathe  [facility] Pager [phone]. If 7PM-7AM, please contact night-coverage at [email or web address], password [facility] 07/06/2013, 12:15 PM  LOS: 3 days

## 2013-07-06 NOTE — Progress Notes (Signed)
Patient transferred to unit 5W. CSW has given report to CSW covering unit who will proceed with plans for ?SNF placement, and substance abuse rehab.    Ky Barban, MSW, [facility] Clinical Social Worker [phone]

## 2013-07-06 NOTE — Clinical Social Work Note (Signed)
CSW continuing to monitor patient progress and will follow-up with skilled facilities on Monday to determine bed availability.  Keeghan Mcintire Givens, MSW, LCSW [phone]

## 2013-07-06 NOTE — Care Management Note (Signed)
   CARE MANAGEMENT NOTE 07/06/2013  Patient:  Mancil,Ramesha   Account Number:  [id number]  Date Initiated:  07/04/2013  Documentation initiated by:  Elissa Hefty  Subjective/Objective Assessment:   adm w bacteremia     Action/Plan:   lives w fam, no ins listed. pt from [place], hx if drug use per chart  9/18 consult for LTAC, pt does not have acute needs. Not eligible for [facility] or [facility].   Anticipated DC Date:  07/07/2013   Anticipated DC Plan:    In-house referral  Clinical Social Worker      DC Planning Services  CM consult  [facility]      Choice offered to / List presented to:             Status of service:  In process, will continue to follow Medicare Important Message given?   (If response is "NO", the following Medicare IM given date fields will be blank) Date Medicare IM given:   Date Additional Medicare IM given:    Discharge Disposition:    Per UR Regulation:  Reviewed for med. necessity/level of care/duration of stay  If discussed at [place] of Stay Meetings, dates discussed:    Comments:  07/06/2013 Consult for LTAC, [facility] will not accept as pt has no payor source. Will contact [facility] however pt does have acute care needs, therefore most likely not eligible. Jasmine Pang RN MPH, [phone] [place] Spoke with [facility] and pt is not appropriate for admission to LTAC. Jasmine Pang RN MPH, case manager, [phone]  9/17 [phone] debbie dowell rn,bsn spoke w pt. no ins. lives in [place]. left pt resource list for [facility] clinic in [place] that she can contact if she would like to establish pcp. will follow to see meds pt will be on at disch.

## 2013-07-07 ENCOUNTER — Inpatient Hospital Stay (HOSPITAL_COMMUNITY): Payer: Self-pay

## 2013-07-07 DIAGNOSIS — L03011 Cellulitis of right finger: Secondary | ICD-10-CM

## 2013-07-07 DIAGNOSIS — D649 Anemia, unspecified: Secondary | ICD-10-CM

## 2013-07-07 DIAGNOSIS — IMO0002 Reserved for concepts with insufficient information to code with codable children: Secondary | ICD-10-CM

## 2013-07-07 HISTORY — DX: Cellulitis of right finger: L03.011

## 2013-07-07 LAB — RETICULOCYTES
RBC.: 3.15 MIL/uL — ABNORMAL LOW (ref 3.87–5.11)
Retic Count, Absolute: 59.9 10*3/uL (ref 19.0–186.0)
Retic Ct Pct: 1.9 % (ref 0.4–3.1)

## 2013-07-07 LAB — CBC
HCT: 23.4 % — ABNORMAL LOW (ref 36.0–46.0)
MCHC: 32.1 g/dL (ref 30.0–36.0)
MCV: 77.7 fL — ABNORMAL LOW (ref 78.0–100.0)
RDW: 16.1 % — ABNORMAL HIGH (ref 11.5–15.5)

## 2013-07-07 LAB — IRON AND TIBC: UIBC: 158 ug/dL (ref 125–400)

## 2013-07-07 NOTE — Progress Notes (Signed)
[facility] PROGRESS NOTE  Molly [name] [id number] DOB: 11-Apr-1986 DOA: 07/03/2013 PCP: No primary provider on file.  Assessment/Plan:  MRSA bacteremia with tricuspid valve endocarditis  -known prior dx  -continue Vancomycin, ID on board - TEE revealed small tricuspid valve vegetation with mild TR  - Pt will need PICC line placed for prolonged IV antibiotic administration. Blood cultures repeated since last blood cultures on 9/17 were positive.  RUE cellulitis - Will obtain a RUE ultrasound to assess for abscess of RUE. Differential also includes RUE dvt although less likely given lack of edema, equal pulses of UE's, and no cyanosis as such will not start anticoagulation.  - Patient does report in history that she left AMA from her prior hospitalization with IV line in place and that she had used the site and pushed water into it.  Acute respiratory failure with hypoxia/known Multiple lung abscesses  -Resolved and on room air.  Hypokalemia  -resolved after repletion  Anemia, unspecified  -after review of records from [facility] baseline is around 7.5 to 8.0  - s/p 1 unit of PRBC - obtain anemia panel  Intravenous drug abuse, continuous (Heroin)  -consulted Psych evaluated for possibility of rehab placement but reportedly patient does not have significant withdrawal symptoms to qualify for rehab.  Pulmonary HTN/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  -No defect in atrial septum no right to left atrial level shunt  Code Status: full Family Communication: no family members at bedside Disposition Plan: SNF vs home with home health (Concern that since patient is self pay she may not be able to pay for SNF nor would she be accepted into SNF due to her age and h/o drug use.)  Consultants:  ID: Dr. Johnnye Sima  Procedures:  TEE  Antibiotics:  Vancomycin   HPI/Subjective: No new complaints. No acute issues overnight.  Objective: Filed Vitals:   07/07/13 0657  BP: 113/73   Pulse: 72  Temp: 98.4 F (36.9 C)  Resp: 18    Intake/Output Summary (Last 24 hours) at 07/07/13 0959 Last data filed at 07/07/13 [id number]  Gross per 24 hour  Intake 1150.67 ml  Output      0 ml  Net 1150.67 ml   Filed Weights   07/03/13 2030 07/04/13 2057 07/05/13 2037  Weight: 51.256 kg (113 lb) 52.3 kg (115 lb 4.8 oz) 53.162 kg (117 lb 3.2 oz)    Exam:   General:  Pt in NAD, Alert and Awake  Cardiovascular: RRR, no cyanotic extremities  Respiratory: CTA BL, no wheezes  Abdomen: soft, NT, ND  Musculoskeletal: no cyanosis or clubbing   Data Reviewed: Basic Metabolic Panel:  Recent Labs Lab 07/03/13 1753 07/04/13 0330 07/05/13 0502  NA 136 136 138  K 3.0* 3.3* 4.0  CL 99 100 105  CO2 31 29 27   GLUCOSE 109* 113* 100*  BUN 18 13 13   CREATININE 0.89 0.82 0.75  CALCIUM 8.4 8.0* 7.6*   Liver Function Tests:  Recent Labs Lab 07/03/13 1929  AST 13  ALT 8  ALKPHOS 117  BILITOT 0.4  PROT 7.1  ALBUMIN 2.3*   No results found for this basename: LIPASE, AMYLASE,  in the last 168 hours No results found for this basename: AMMONIA,  in the last 168 hours CBC:  Recent Labs Lab 07/03/13 1753 07/04/13 0520 07/05/13 0502 07/06/13 0640 07/07/13 0500  WBC 5.6 4.8 5.5 5.1 5.2  HGB 7.7* 6.1* 7.4* 7.1* 7.5*  HCT 23.7* 18.7* 22.3* 21.9* 23.4*  MCV 75.0* 74.2* 75.9*  77.4* 77.7*  PLT 252 231 264 280 323   Cardiac Enzymes: No results found for this basename: CKTOTAL, CKMB, CKMBINDEX, TROPONINI,  in the last 168 hours BNP (last 3 results)  Recent Labs  07/03/13 1919  PROBNP 3241.0*   CBG: No results found for this basename: GLUCAP,  in the last 168 hours  Recent Results (from the past 240 hour(s))  CULTURE, BLOOD (ROUTINE X 2)     Status: None   Collection Time    07/03/13  8:25 PM      Result Value Range Status   Specimen Description BLOOD HAND RIGHT   Final   Special Requests BOTTLES DRAWN AEROBIC ONLY 0.5CC   Final   Culture  Setup Time     Final    Value: 07/04/2013 05:08     Performed at [facility]   Culture     Final   Value: METHICILLIN RESISTANT STAPHYLOCOCCUS AUREUS     Note: RIFAMPIN AND GENTAMICIN SHOULD NOT BE USED AS SINGLE DRUGS FOR TREATMENT OF STAPH INFECTIONS. This organism DOES NOT demonstrate inducible Clindamycin resistance in vitro. CRITICAL RESULT CALLED TO, READ BACK BY AND VERIFIED WITH: ASHLEY LARSON      07/05/13 1425 BY SMITHERSJ     Note: Gram Stain Report Called to,Read Back By and Verified With: KAMI MOORE ON 07/04/2013 AT 8:43P BY WILEJ     Performed at [facility]   Report Status 07/06/2013 FINAL   Final   Organism ID, Bacteria METHICILLIN RESISTANT STAPHYLOCOCCUS AUREUS   Final  CULTURE, BLOOD (ROUTINE X 2)     Status: None   Collection Time    07/03/13  8:31 PM      Result Value Range Status   Specimen Description BLOOD HAND LEFT   Final   Special Requests BOTTLES DRAWN AEROBIC ONLY 1.5CC   Final   Culture  Setup Time     Final   Value: 07/04/2013 05:08     Performed at [facility]   Culture     Final   Value: STAPHYLOCOCCUS AUREUS     Note: SUSCEPTIBILITIES PERFORMED ON PREVIOUS CULTURE WITHIN THE LAST 5 DAYS.     Note: Gram Stain Report Called to,Read Back By and Verified With: Martinique PERKINS 07/04/13 1505 BY SMITHERSJ     Performed at [facility]   Report Status 07/06/2013 FINAL   Final  MRSA PCR SCREENING     Status: Abnormal   Collection Time    07/03/13 10:19 PM      Result Value Range Status   MRSA by PCR POSITIVE (*) NEGATIVE Final   Comment:            The GeneXpert MRSA Assay (FDA     approved for NASAL specimens     only), is one component of a     comprehensive MRSA colonization     surveillance program. It is not     intended to diagnose MRSA     infection nor to guide or     monitor treatment for     MRSA infections.     RESULT CALLED TO, READ BACK BY AND VERIFIED WITH:     Dorothy Spark (RN) [phone] 07/04/2013 L. LOMAX  CULTURE, BLOOD (SINGLE)      Status: None   Collection Time    07/04/13  3:30 AM      Result Value Range Status   Specimen Description BLOOD LEFT ARM   Final   Special  Requests BOTTLES DRAWN AEROBIC AND ANAEROBIC 10CC   Final   Culture  Setup Time     Final   Value: 07/04/2013 10:30     Performed at [facility]   Culture     Final   Value: STAPHYLOCOCCUS AUREUS     Note: SUSCEPTIBILITIES PERFORMED ON PREVIOUS CULTURE WITHIN THE LAST 5 DAYS.     Note: Gram Stain Report Called to,Read Back By and Verified With: ASHLEY Degollado 07/05/13 0850 BY SMITHERSJ     Performed at [facility]   Report Status 07/06/2013 FINAL   Final  CULTURE, BLOOD (ROUTINE X 2)     Status: None   Collection Time    07/04/13  7:48 PM      Result Value Range Status   Specimen Description BLOOD RIGHT FOREARM   Final   Special Requests BOTTLES DRAWN AEROBIC ONLY 5CC   Final   Culture  Setup Time     Final   Value: 07/05/2013 05:12     Performed at [facility]   Culture     Final   Value:        BLOOD CULTURE RECEIVED NO GROWTH TO DATE CULTURE WILL BE HELD FOR 5 DAYS BEFORE ISSUING A FINAL NEGATIVE REPORT     Performed at [facility]   Report Status PENDING   Incomplete  CULTURE, BLOOD (ROUTINE X 2)     Status: None   Collection Time    07/05/13  5:22 PM      Result Value Range Status   Specimen Description BLOOD LEFT HAND   Final   Special Requests BOTTLES DRAWN AEROBIC ONLY 3CC   Final   Culture  Setup Time     Final   Value: 07/05/2013 22:38     Performed at [facility]   Culture     Final   Value:        BLOOD CULTURE RECEIVED NO GROWTH TO DATE CULTURE WILL BE HELD FOR 5 DAYS BEFORE ISSUING A FINAL NEGATIVE REPORT     Performed at [facility]   Report Status PENDING   Incomplete     Studies: No results found.  Scheduled Meds: . Chlorhexidine Gluconate Cloth  6 each Topical Q0600  . mupirocin ointment  1 application Nasal BID  . nicotine  21 mg Transdermal Daily  . sodium  chloride  3 mL Intravenous Q12H  . vancomycin  750 mg Intravenous Q8H   Continuous Infusions: . sodium chloride 20 mL/hr at 07/07/13 0702    Principal Problem:   Bacterial endocarditis Active Problems:   Multiple lung abscesses   Intravenous drug abuse, continuous   MRSA bacteremia   Hypokalemia   Acute respiratory failure with hypoxia   Anemia, unspecified   Pulmonary HTN   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   Hepatitis C   Cellulitis of upper extremity    Time spent: > 35 minutes    Velvet Bathe  [facility] Pager [phone]. If 7PM-7AM, please contact night-coverage at [email or web address], password [facility] 07/07/2013, 9:59 AM  LOS: 4 days

## 2013-07-07 NOTE — Progress Notes (Signed)
ANTIBIOTIC CONSULT NOTE - FOLLOW UP  Pharmacy Consult for Vancomycin Indication: endocarditis  No Known Allergies  Patient Measurements: Height: 5\' 2"  (157.5 cm) Weight: 117 lb 3.2 oz (53.162 kg) IBW/kg (Calculated) : 50.1  Vital Signs: Temp: 98.1 F (36.7 C) (09/20 1433) Temp src: Oral (09/20 1433) BP: 106/65 mmHg (09/20 1433) Pulse Rate: 68 (09/20 1433) Intake/Output from previous day: 09/19 0701 - 09/20 0700 In: 1060.7 [P.O.:560; I.V.:200.7; IV Piggyback:300] Out: -  Intake/Output from this shift: Total I/O In: 360 [P.O.:360] Out: -   Labs:  Recent Labs  07/05/13 0502 07/06/13 0640 07/07/13 0500  WBC 5.5 5.1 5.2  HGB 7.4* 7.1* 7.5*  PLT 264 280 323  CREATININE 0.75  --   --    Estimated Creatinine Clearance: 84.3 ml/min (by C-G formula based on Cr of 0.75).  Recent Labs  07/05/13 2230 07/07/13 1455  VANCOTROUGH 9.0* 18.4     Microbiology: Recent Results (from the past 720 hour(s))  CULTURE, BLOOD (ROUTINE X 2)     Status: None   Collection Time    07/03/13  8:25 PM      Result Value Range Status   Specimen Description BLOOD HAND RIGHT   Final   Special Requests BOTTLES DRAWN AEROBIC ONLY 0.5CC   Final   Culture  Setup Time     Final   Value: 07/04/2013 05:08     Performed at [facility]   Culture     Final   Value: METHICILLIN RESISTANT STAPHYLOCOCCUS AUREUS     Note: RIFAMPIN AND GENTAMICIN SHOULD NOT BE USED AS SINGLE DRUGS FOR TREATMENT OF STAPH INFECTIONS. This organism DOES NOT demonstrate inducible Clindamycin resistance in vitro. CRITICAL RESULT CALLED TO, READ BACK BY AND VERIFIED WITH: ASHLEY LARSON      07/05/13 1425 BY SMITHERSJ     Note: Gram Stain Report Called to,Read Back By and Verified With: KAMI MOORE ON 07/04/2013 AT 8:43P BY WILEJ     Performed at [facility]   Report Status 07/06/2013 FINAL   Final   Organism ID, Bacteria METHICILLIN RESISTANT STAPHYLOCOCCUS AUREUS   Final  CULTURE, BLOOD (ROUTINE X 2)      Status: None   Collection Time    07/03/13  8:31 PM      Result Value Range Status   Specimen Description BLOOD HAND LEFT   Final   Special Requests BOTTLES DRAWN AEROBIC ONLY 1.5CC   Final   Culture  Setup Time     Final   Value: 07/04/2013 05:08     Performed at [facility]   Culture     Final   Value: STAPHYLOCOCCUS AUREUS     Note: SUSCEPTIBILITIES PERFORMED ON PREVIOUS CULTURE WITHIN THE LAST 5 DAYS.     Note: Gram Stain Report Called to,Read Back By and Verified With: Martinique PERKINS 07/04/13 1505 BY SMITHERSJ     Performed at [facility]   Report Status 07/06/2013 FINAL   Final  MRSA PCR SCREENING     Status: Abnormal   Collection Time    07/03/13 10:19 PM      Result Value Range Status   MRSA by PCR POSITIVE (*) NEGATIVE Final   Comment:            The GeneXpert MRSA Assay (FDA     approved for NASAL specimens     only), is one component of a     comprehensive MRSA colonization     surveillance program. It  is not     intended to diagnose MRSA     infection nor to guide or     monitor treatment for     MRSA infections.     RESULT CALLED TO, READ BACK BY AND VERIFIED WITH:     Dorothy Spark (RN) [phone] 07/04/2013 L. LOMAX  CULTURE, BLOOD (SINGLE)     Status: None   Collection Time    07/04/13  3:30 AM      Result Value Range Status   Specimen Description BLOOD LEFT ARM   Final   Special Requests BOTTLES DRAWN AEROBIC AND ANAEROBIC 10CC   Final   Culture  Setup Time     Final   Value: 07/04/2013 10:30     Performed at [facility]   Culture     Final   Value: STAPHYLOCOCCUS AUREUS     Note: SUSCEPTIBILITIES PERFORMED ON PREVIOUS CULTURE WITHIN THE LAST 5 DAYS.     Note: Gram Stain Report Called to,Read Back By and Verified With: ASHLEY Defenbaugh 07/05/13 0850 BY SMITHERSJ     Performed at [facility]   Report Status 07/06/2013 FINAL   Final  CULTURE, BLOOD (ROUTINE X 2)     Status: None   Collection Time    07/04/13  7:48 PM      Result  Value Range Status   Specimen Description BLOOD RIGHT FOREARM   Final   Special Requests BOTTLES DRAWN AEROBIC ONLY 5CC   Final   Culture  Setup Time     Final   Value: 07/05/2013 05:12     Performed at [facility]   Culture     Final   Value:        BLOOD CULTURE RECEIVED NO GROWTH TO DATE CULTURE WILL BE HELD FOR 5 DAYS BEFORE ISSUING A FINAL NEGATIVE REPORT     Performed at [facility]   Report Status PENDING   Incomplete  CULTURE, BLOOD (ROUTINE X 2)     Status: None   Collection Time    07/05/13  5:22 PM      Result Value Range Status   Specimen Description BLOOD LEFT HAND   Final   Special Requests BOTTLES DRAWN AEROBIC ONLY 3CC   Final   Culture  Setup Time     Final   Value: 07/05/2013 22:38     Performed at [facility]   Culture     Final   Value:        BLOOD CULTURE RECEIVED NO GROWTH TO DATE CULTURE WILL BE HELD FOR 5 DAYS BEFORE ISSUING A FINAL NEGATIVE REPORT     Performed at [facility]   Report Status PENDING   Incomplete  CULTURE, BLOOD (ROUTINE X 2)     Status: None   Collection Time    07/06/13 12:15 AM      Result Value Range Status   Specimen Description BLOOD RIGHT HAND   Final   Special Requests BOTTLES DRAWN AEROBIC ONLY 4CC,PT ON VANCOMYCIN   Final   Culture  Setup Time     Final   Value: 07/06/2013 09:48     Performed at [facility]   Culture     Final   Value:        BLOOD CULTURE RECEIVED NO GROWTH TO DATE CULTURE WILL BE HELD FOR 5 DAYS BEFORE ISSUING A FINAL NEGATIVE REPORT     Performed at [facility]   Report  Status PENDING   Incomplete  CULTURE, BLOOD (ROUTINE X 2)     Status: None   Collection Time    07/06/13 12:20 AM      Result Value Range Status   Specimen Description BLOOD LEFT HAND   Final   Special Requests     Final   Value: BOTTLES DRAWN AEROBIC AND ANAEROBIC 10CC,PT ON VANCOMYCIN   Culture  Setup Time     Final   Value: 07/06/2013 09:47     Performed at [facility]    Culture     Final   Value:        BLOOD CULTURE RECEIVED NO GROWTH TO DATE CULTURE WILL BE HELD FOR 5 DAYS BEFORE ISSUING A FINAL NEGATIVE REPORT     Performed at [facility]   Report Status PENDING   Incomplete    Anti-infectives   Start     Dose/Rate Route Frequency Ordered Stop   07/06/13 0700  vancomycin (VANCOCIN) IVPB 750 mg/150 ml premix     750 mg 150 mL/hr over 60 Minutes Intravenous Every 8 hours 07/05/13 2353     07/04/13 1000  vancomycin (VANCOCIN) IVPB 750 mg/150 ml premix  Status:  Discontinued     750 mg 150 mL/hr over 60 Minutes Intravenous Every 12 hours 07/03/13 2106 07/03/13 2159   07/04/13 0900  vancomycin (VANCOCIN) IVPB 750 mg/150 ml premix  Status:  Discontinued     750 mg 150 mL/hr over 60 Minutes Intravenous Every 12 hours 07/03/13 2159 07/05/13 2353   07/03/13 2115  vancomycin (VANCOCIN) IVPB 1000 mg/200 mL premix     1,000 mg 200 mL/hr over 60 Minutes Intravenous  Once 07/03/13 2106 07/03/13 2211      Assessment: 32 YOF with history of IVDU recently admitted to [facility] with diagnosis of endocarditis and PNA. Patient left AMA on 06/26/13 and presented to [facility]  07/03/2013 . Pharmacy asked to start IV vancomycin.  Infectious Disease: Vanc for endocarditis and/or septic emboli, WBC WNL, afebrile, Tm 100.4. TEE confirms TV endocarditis. CXR concerning for septic emboli or PNA. Plan 6 weeks IV Vanc- from neg BCx - pt also has RUE cellulitis, plan for RUE [place] to assess for abscess.   9/16 Vanco>>  9/8 [facility] blood cx - MRSA (sensitive to Clinda, Vanc) 9/16 blood cx x2- MRSA 9/17 blood x1-gpc 9/18 BCx>>ngtd 9/19 BCx>> Hep C -neg  9/18 Vanco TR: 9 (changed from 750 q12h>>750 q8h) 9/20 Vanc TR 18.4     Goal of Therapy:  Vancomycin trough level 15-20 mcg/ml  Plan:  Continue Vancomycin 750 q8h, follow up trough q7d, or as otherwise clinically indicated. Follow up SCr, UOP, cultures, clinical course and adjust as clinically indicated.   Thank  you for allowing pharmacy to be a part of this patients care team.  Rowe Robert Pharm.D., BCPS Clinical Pharmacist 07/07/2013 5:20 PM Pager: [phone] Phone: [phone]

## 2013-07-08 ENCOUNTER — Encounter (HOSPITAL_COMMUNITY): Payer: Self-pay | Admitting: General Surgery

## 2013-07-08 DIAGNOSIS — IMO0002 Reserved for concepts with insufficient information to code with codable children: Secondary | ICD-10-CM

## 2013-07-08 DIAGNOSIS — L0291 Cutaneous abscess, unspecified: Secondary | ICD-10-CM

## 2013-07-08 DIAGNOSIS — Z1611 Resistance to penicillins: Secondary | ICD-10-CM

## 2013-07-08 LAB — FERRITIN: Ferritin: 117 ng/mL (ref 10–291)

## 2013-07-08 MED ORDER — FERROUS SULFATE 325 (65 FE) MG PO TABS
325.0000 mg | ORAL_TABLET | Freq: Three times a day (TID) | ORAL | Status: DC
  Administered 2013-07-08 (×2): 325 mg via ORAL
  Filled 2013-07-08 (×3): qty 1

## 2013-07-08 NOTE — Progress Notes (Signed)
Patient appeared anxious asking where can she go and meet her family.  Told patient they could meet her up here on the unit. I asked patient about her pain she rates it a 7 out of 10.  I told the patient that I would bring her an Ativan & pain medication at 8:30.  Patient became agitated and told me to [facility] her IV right now.  I unhooked her IV from her fluid and she said I'm leaving and left the unit.  Patient still has IV in.  [place], [place] & Kathline Magic, NP notified.

## 2013-07-08 NOTE — Consult Note (Signed)
Molly [name] 03-Apr-1986  [id number].    Requesting MD: Dr. Wendee Beavers Chief Complaint/Reason for Consult: Abscess right medial arm HPI:  27 y.o. female who presents to ED at [facility] after having left AMA from [facility] on 06/26/13 after being diagnosed with MRSA bacteremia (Multiple cultures positive see their discharge note for description, sensitive to vancomycin with MIC to vancomycin < 0.5), multiple lung abscesses suspicious of septic emboli. TEE was still pending at that time to confirm diagnosis of bacterial endocarditis. The cause of her endocarditis is likely due to IVDU, she admits to using Opana IV and abusing oral pain killers.    She states when she left AMA from [facility] she left without taking our her IV.  Then on 06/28/13 The IV site looked to be swelling up and became painful.  She "flushed it with water" and "tried to pull back blood", but "it didn't work".  She and a friend pulled out the IV site and cleaned it with alcohol.  It progressively became more swollen and painful.  She has a history of MRSA abscess on her buttock in the past.  In addition her chest pain/SOB worsened thus she came to the hospital on 07/03/13.  She was started on IV antibiotics and further workup.  A TEE done on 07/04/13 showed a tricuspid vegetation.  We were consulted regarding the small abscess on the medial right upper arm.  Pt notes this has improved greatly since admission.  She says there was a small amount of pus which came out initially, but denies current drainage.     ROS: All systems reviewed and otherwise negative except for as above  History reviewed. No pertinent family history.  Past Medical History  Diagnosis Date  . IV drug user     Past Surgical History  Procedure Laterality Date  . Tee without cardioversion N/A 07/04/2013    Procedure: TRANSESOPHAGEAL ECHOCARDIOGRAM (TEE);  Surgeon: Larey Dresser, MD;  Location: [facility];  Service: Cardiovascular;  Laterality: N/A;    Social History:   reports that she has been smoking.  She does not have any smokeless tobacco history on file. She reports that she uses illicit drugs (IV and Oxycodone). She reports that she does not drink alcohol.  Allergies: No Known Allergies  Medications Prior to Admission  Medication Sig Dispense Refill  . acetaminophen (TYLENOL) 500 MG tablet Take 1,000 mg by mouth every 6 (six) hours as needed for pain.        Blood pressure 134/84, pulse 82, temperature 98.4 F (36.9 C), temperature source Oral, resp. rate 18, height 5\' 2"  (1.575 m), weight 117 lb 3.2 oz (53.162 kg), SpO2 98.00%. Physical Exam: General: pleasant, WD/WN white female who is laying in bed in NAD HEENT: head is normocephalic, atraumatic.  Sclera are noninjected.  PERRL.  Ears and nose without any masses or lesions.  Mouth is pink and moist Heart: regular, rate, and rhythm.  No obvious murmurs, gallops, or rubs noted.  Palpable pedal pulses bilaterally Lungs: CTAB, no wheezes, rhonchi, or rales noted.  Respiratory effort nonlabored Abd: soft, NT/ND, +BS, no masses, hernias, or organomegaly MS: all 4 extremities are symmetrical with no cyanosis, clubbing, or edema EXCEPT FOR 28mm round firm/hard area of induration on the right medial upper arm, no erythema, ttp, no drainage Skin: warm and dry, multiple scars on arms/legs Psych: A&Ox3 with an appropriate affect.  Results for orders placed during the hospital encounter of 07/03/13 (from the past 48 hour(s))  CBC  Status: Abnormal   Collection Time    07/07/13  5:00 AM      Result Value Range   WBC 5.2  4.0 - 10.5 K/uL   RBC 3.01 (*) 3.87 - 5.11 MIL/uL   Hemoglobin 7.5 (*) 12.0 - 15.0 g/dL   HCT 23.4 (*) 36.0 - 46.0 %   MCV 77.7 (*) 78.0 - 100.0 fL   MCH 24.9 (*) 26.0 - 34.0 pg   MCHC 32.1  30.0 - 36.0 g/dL   RDW 16.1 (*) 11.5 - 15.5 %   Platelets 323  150 - 400 K/uL  VITAMIN B12     Status: None   Collection Time    07/07/13 12:35 PM      Result Value Range   Vitamin B-12 820   211 - 911 pg/mL   Comment: Performed at [facility]     Status: None   Collection Time    07/07/13 12:35 PM      Result Value Range   Folate 13.4     Comment: (NOTE)     Reference Ranges            Deficient:       0.4 - 3.3 ng/mL            Indeterminate:   3.4 - 5.4 ng/mL            Normal:              > 5.4 ng/mL     Performed at [place] TIBC     Status: Abnormal   Collection Time    07/07/13 12:35 PM      Result Value Range   Iron 18 (*) 42 - 135 ug/dL   TIBC 176 (*) 250 - 470 ug/dL   Saturation Ratios 10 (*) 20 - 55 %   UIBC 158  125 - 400 ug/dL   Comment: Performed at [place] [facility]     Status: None   Collection Time    07/07/13 12:35 PM      Result Value Range   Ferritin 117  10 - 291 ng/mL   Comment: Performed at [place]     Status: Abnormal   Collection Time    07/07/13 12:35 PM      Result Value Range   Retic Ct Pct 1.9  0.4 - 3.1 %   RBC. 3.15 (*) 3.87 - 5.11 MIL/uL   Retic Count, Manual 59.9  19.0 - 186.0 K/uL  VANCOMYCIN, TROUGH     Status: None   Collection Time    07/07/13  2:55 PM      Result Value Range   Vancomycin Tr 18.4  10.0 - 20.0 ug/mL   [place] Extrem Up Right Ltd  07/07/2013   CLINICAL DATA:  Evaluate for abscess  EXAM: RIGHT UPPER EXTREMITY SOFT TISSUE ULTRASOUND LIMITED. Sonographic evaluation of the forearm within a location of swelling was performed.  COMPARISON:  None.  FINDINGS: A 5 x 5 x 3 mm subcutaneous fluid collection corresponds to the area of concern.  IMPRESSION: There is a 5 x 5 x 3 mm subcutaneous fluid collection within the area of concern.   Electronically Signed   By: Maryclare Bean M.D.   On: 07/07/2013 14:42       Assessment/Plan MRSA bacteremia with tricuspid valve endocarditis RUE abscess without cellulitis IV drug abuse, continuous (Heroin, Opana)  Plan: 1.  Normal WBC, afebrile 2.  Continue antibiotics 3.  Hot packs 4.  No surgical  intervention needed at this time as it is significantly improved and only 16mm x 35mm x 74mm, will likely resolve on its own, will recheck tomorrow   DORT, Yilin Weedon 07/08/2013, 11:10 AM Pager: [phone]

## 2013-07-08 NOTE — Consult Note (Signed)
Very small area on R arm without fluctuance or cellulitis. Has improved considerably on ABX. No need to I&D now but will follow up in AM. Plan D/W patient. Patient examined and I agree with the assessment and plan  Georganna Skeans, MD, MPH, FACS Pager: [phone]  07/08/2013 12:49 PM

## 2013-07-08 NOTE — Progress Notes (Signed)
[facility] PROGRESS NOTE  Molly [name] [id number] DOB: July 25, 1986 DOA: 07/03/2013 PCP: No primary provider on file.  Assessment/Plan:  MRSA bacteremia with tricuspid valve endocarditis  -known prior dx  -continue Vancomycin, ID on board - TEE revealed small tricuspid valve vegetation with mild TR  - Pt will need PICC line placed for prolonged IV antibiotic administration. Last Blood culture on 9/19 remain negative.  RUE cellulitis - Will obtain a RUE ultrasound performed yesterday 9/20 and reported a 5x5x3 mm fluid collection. - General surgery consulted for further recommendations from their standpoint - Patient reports that she had left [facility] and had left with IV in place. She used the IV line while at home and states that she also ran tap water through it.  Since has developed RUE discomfort.  Acute respiratory failure with hypoxia/known Multiple lung abscesses  -Resolved and on room air.  Hypokalemia  -resolved after repletion  Anemia, unspecified  -after review of records from [facility] baseline is around 7.5 to 8.0  - s/p 1 unit of PRBC - Based on anemia panel patient has iron deficiency. Will plan on replacing with ferrous sulfate 325 mg po tid.  Intravenous drug abuse, continuous (Heroin)  -consulted Psych evaluated for possibility of rehab placement but reportedly patient does not have significant withdrawal symptoms to qualify for rehab. - Discussed disposition options with patient and currently patient would prefer home health with nursing for medication administration.  Mother would prefer this as well.  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  Patient and mother refused SNF.  Consultants:  ID: Dr. Johnnye Sima  Procedures:  TEE  Antibiotics:  Vancomycin    HPI/Subjective: Her main concern this AM is the discomfort at her RUE.  No acute issues overnight.  Objective: Filed Vitals:   07/08/13 0825  BP: 134/84  Pulse: 82  Temp: 98.4 F (36.9 C)  Resp: 18    Intake/Output Summary (Last 24 hours) at 07/08/13 1105 Last data filed at 07/08/13 0900  Gross per 24 hour  Intake 688.67 ml  Output      0 ml  Net 688.67 ml   Filed Weights   07/03/13 2030 07/04/13 2057 07/05/13 2037  Weight: 51.256 kg (113 lb) 52.3 kg (115 lb 4.8 oz) 53.162 kg (117 lb 3.2 oz)    Exam:   General:  Pt in NAD, Alert and Awake  Cardiovascular: RRR, no cyanotic extremities  Respiratory: CTA BL, no wheezes  Abdomen: soft, NT, ND  Musculoskeletal: no cyanosis or clubbing   Data Reviewed: Basic Metabolic Panel:  Recent Labs Lab 07/03/13 1753 07/04/13 0330 07/05/13 0502  NA 136 136 138  K 3.0* 3.3* 4.0  CL 99 100 105  CO2 31 29 27   GLUCOSE 109* 113* 100*  BUN 18 13 13   CREATININE 0.89 0.82 0.75  CALCIUM 8.4 8.0* 7.6*   Liver Function Tests:  Recent Labs Lab 07/03/13 1929  AST 13  ALT 8  ALKPHOS 117  BILITOT 0.4  PROT 7.1  ALBUMIN 2.3*   No results found for this basename: LIPASE, AMYLASE,  in the last 168 hours No results found for this basename: AMMONIA,  in the last 168 hours CBC:  Recent Labs Lab 07/03/13 1753 07/04/13 0520 07/05/13 0502 07/06/13 0640 07/07/13 0500  WBC 5.6 4.8 5.5 5.1 5.2  HGB 7.7*  6.1* 7.4* 7.1* 7.5*  HCT 23.7* 18.7* 22.3* 21.9* 23.4*  MCV 75.0* 74.2* 75.9* 77.4* 77.7*  PLT 252 231 264 280 323   Cardiac Enzymes: No results found for this basename: CKTOTAL, CKMB, CKMBINDEX, TROPONINI,  in the last 168 hours BNP (last 3 results)  Recent Labs  07/03/13 1919  PROBNP 3241.0*   CBG: No results found for this basename: GLUCAP,  in the last 168 hours  Recent Results (from the past 240 hour(s))  CULTURE, BLOOD (ROUTINE X 2)     Status: None   Collection Time    07/03/13  8:25 PM      Result  Value Range Status   Specimen Description BLOOD HAND RIGHT   Final   Special Requests BOTTLES DRAWN AEROBIC ONLY 0.5CC   Final   Culture  Setup Time     Final   Value: 07/04/2013 05:08     Performed at [facility]   Culture     Final   Value: METHICILLIN RESISTANT STAPHYLOCOCCUS AUREUS     Note: RIFAMPIN AND GENTAMICIN SHOULD NOT BE USED AS SINGLE DRUGS FOR TREATMENT OF STAPH INFECTIONS. This organism DOES NOT demonstrate inducible Clindamycin resistance in vitro. CRITICAL RESULT CALLED TO, READ BACK BY AND VERIFIED WITH: ASHLEY LARSON      07/05/13 1425 BY SMITHERSJ     Note: Gram Stain Report Called to,Read Back By and Verified With: KAMI MOORE ON 07/04/2013 AT 8:43P BY WILEJ     Performed at [facility]   Report Status 07/06/2013 FINAL   Final   Organism ID, Bacteria METHICILLIN RESISTANT STAPHYLOCOCCUS AUREUS   Final  CULTURE, BLOOD (ROUTINE X 2)     Status: None   Collection Time    07/03/13  8:31 PM      Result Value Range Status   Specimen Description BLOOD HAND LEFT   Final   Special Requests BOTTLES DRAWN AEROBIC ONLY 1.5CC   Final   Culture  Setup Time     Final   Value: 07/04/2013 05:08     Performed at [facility]   Culture     Final   Value: STAPHYLOCOCCUS AUREUS     Note: SUSCEPTIBILITIES PERFORMED ON PREVIOUS CULTURE WITHIN THE LAST 5 DAYS.     Note: Gram Stain Report Called to,Read Back By and Verified With: Martinique PERKINS 07/04/13 1505 BY SMITHERSJ     Performed at [facility]   Report Status 07/06/2013 FINAL   Final  MRSA PCR SCREENING     Status: Abnormal   Collection Time    07/03/13 10:19 PM      Result Value Range Status   MRSA by PCR POSITIVE (*) NEGATIVE Final   Comment:            The GeneXpert MRSA Assay (FDA     approved for NASAL specimens     only), is one component of a     comprehensive MRSA colonization     surveillance program. It is not     intended to diagnose MRSA     infection nor to guide or     monitor  treatment for     MRSA infections.     RESULT CALLED TO, READ BACK BY AND VERIFIED WITH:     Dorothy Spark (RN) [phone] 07/04/2013 L. LOMAX  CULTURE, BLOOD (SINGLE)     Status: None   Collection Time    07/04/13  3:30 AM      Result  Value Range Status   Specimen Description BLOOD LEFT ARM   Final   Special Requests BOTTLES DRAWN AEROBIC AND ANAEROBIC 10CC   Final   Culture  Setup Time     Final   Value: 07/04/2013 10:30     Performed at [facility]   Culture     Final   Value: STAPHYLOCOCCUS AUREUS     Note: SUSCEPTIBILITIES PERFORMED ON PREVIOUS CULTURE WITHIN THE LAST 5 DAYS.     Note: Gram Stain Report Called to,Read Back By and Verified With: ASHLEY Luckadoo 07/05/13 0850 BY SMITHERSJ     Performed at [facility]   Report Status 07/06/2013 FINAL   Final  CULTURE, BLOOD (ROUTINE X 2)     Status: None   Collection Time    07/04/13  7:48 PM      Result Value Range Status   Specimen Description BLOOD RIGHT FOREARM   Final   Special Requests BOTTLES DRAWN AEROBIC ONLY 5CC   Final   Culture  Setup Time     Final   Value: 07/05/2013 05:12     Performed at [facility]   Culture     Final   Value:        BLOOD CULTURE RECEIVED NO GROWTH TO DATE CULTURE WILL BE HELD FOR 5 DAYS BEFORE ISSUING A FINAL NEGATIVE REPORT     Performed at [facility]   Report Status PENDING   Incomplete  CULTURE, BLOOD (ROUTINE X 2)     Status: None   Collection Time    07/05/13  5:22 PM      Result Value Range Status   Specimen Description BLOOD LEFT HAND   Final   Special Requests BOTTLES DRAWN AEROBIC ONLY 3CC   Final   Culture  Setup Time     Final   Value: 07/05/2013 22:38     Performed at [facility]   Culture     Final   Value:        BLOOD CULTURE RECEIVED NO GROWTH TO DATE CULTURE WILL BE HELD FOR 5 DAYS BEFORE ISSUING A FINAL NEGATIVE REPORT     Performed at [facility]   Report Status PENDING   Incomplete  CULTURE, BLOOD (ROUTINE X 2)     Status:  None   Collection Time    07/06/13 12:15 AM      Result Value Range Status   Specimen Description BLOOD RIGHT HAND   Final   Special Requests BOTTLES DRAWN AEROBIC ONLY 4CC,PT ON VANCOMYCIN   Final   Culture  Setup Time     Final   Value: 07/06/2013 09:48     Performed at [facility]   Culture     Final   Value:        BLOOD CULTURE RECEIVED NO GROWTH TO DATE CULTURE WILL BE HELD FOR 5 DAYS BEFORE ISSUING A FINAL NEGATIVE REPORT     Performed at [facility]   Report Status PENDING   Incomplete  CULTURE, BLOOD (ROUTINE X 2)     Status: None   Collection Time    07/06/13 12:20 AM      Result Value Range Status   Specimen Description BLOOD LEFT HAND   Final   Special Requests     Final   Value: BOTTLES DRAWN AEROBIC AND ANAEROBIC 10CC,PT ON VANCOMYCIN   Culture  Setup Time     Final   Value: 07/06/2013 09:47  Performed at [facility]     Final   Value:        BLOOD CULTURE RECEIVED NO GROWTH TO DATE CULTURE WILL BE HELD FOR 5 DAYS BEFORE ISSUING A FINAL NEGATIVE REPORT     Performed at [facility]   Report Status PENDING   Incomplete     Studies: [place]  July 25, 2013   CLINICAL DATA:  Evaluate for abscess  EXAM: RIGHT UPPER EXTREMITY SOFT TISSUE ULTRASOUND LIMITED. Sonographic evaluation of the forearm within a location of swelling was performed.  COMPARISON:  None.  FINDINGS: A 5 x 5 x 3 mm subcutaneous fluid collection corresponds to the area of concern.  IMPRESSION: There is a 5 x 5 x 3 mm subcutaneous fluid collection within the area of concern.   Electronically Signed   By: Maryclare Bean M.D.   On: Jul 25, 2013 14:42    Scheduled Meds: . Chlorhexidine Gluconate Cloth  6 each Topical Q0600  . mupirocin ointment  1 application Nasal BID  . nicotine  21 mg Transdermal Daily  . sodium chloride  3 mL Intravenous Q12H  . vancomycin  750 mg Intravenous Q8H   Continuous Infusions: . 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 If 7PM-7AM, please contact night-coverage at [email or web address], password [facility] 07/08/2013, 11:05 AM  LOS: 5 days

## 2013-07-11 LAB — CULTURE, BLOOD (ROUTINE X 2)

## 2013-07-12 LAB — CULTURE, BLOOD (ROUTINE X 2): Culture: NO GROWTH

## 2013-07-21 NOTE — Discharge Summary (Signed)
Physician Discharge Summary  Molly [name] [id number] DOB: 1986-10-08 DOA: 07/03/2013  PCP: No primary provider on file.  Admit date: 07/03/2013 Discharge date: 07/21/2013  Time spent: 20 minutes  Recommendations for Outpatient Follow-up:  1. Patient left AMA before any further follow up recommendations could be given.  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 female who presents to the ED at [facility] after having left AMA from [facility] 5 days ago after being diagnosed with MRSA bacteremia (Multiple cultures positive see their discharge note for description, sensitive to vancomycin with MIC to vancomycin < 0.5), multiple lung abscesses suspicious of septic emboli. TEE was still pending at that time to confirm diagnosis of bacterial endocarditis. 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  Hospital Course:   MRSA bacteremia with tricuspid valve endocarditis  - continued Vancomycin, ID on board  - TEE revealed small tricuspid valve vegetation with mild TR  - Left AMA prior to completion of therapy  RUE cellulitis  - Will obtain a RUE ultrasound performed yesterday 9/20 and reported a 5x5x3 mm fluid collection.  - General surgery consulted for further recommendations from their standpoint  - Patient reports that she had left [facility] and had left with IV in place. She used the IV  line while at home and states that she also ran tap water through it. Since has developed RUE discomfort.  - Left AMA prior to completion of therapy  Acute respiratory failure with hypoxia/known Multiple lung abscesses  - Left AMA prior to completion of therapy    Hypokalemia  -resolved after repletion   Anemia, unspecified  -after review of records from [facility] baseline is around 7.5 to 8.0  - s/p 1 unit of PRBC  - Based on anemia panel patient has iron deficiency. Replaced with ferrous sulfate 325 mg po tid while in house  Intravenous drug abuse, continuous (Heroin)  -consulted Psych evaluated for possibility of rehab placement but reportedly patient does not have significant withdrawal symptoms to qualify for rehab.  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  Discharge Instructions     Medication List    ASK your doctor about these medications       acetaminophen 500 MG tablet  Commonly known as:  TYLENOL  Take 1,000 mg by mouth every 6 (six) hours as needed for pain.       No  Known Allergies    The results of significant diagnostics from this hospitalization (including imaging, microbiology, ancillary and laboratory) are listed below for reference.    Significant Diagnostic Studies: Dg Chest 2 View  07/03/2013   CLINICAL DATA:  Chest pain, fever, shortness of Breath  EXAM: CHEST  2 VIEW  COMPARISON:  06/22/2013 and 06/23/2013  FINDINGS: Cardiomediastinal silhouette is stable. Persistent multifocal bilateral nodular consolidation. Largest in right midlung measures 1.8 cm. The largest in left midlung measures  1.1 cm. Findings are consistent with persistent multifocal pneumonia or septic emboli. No pulmonary edema.  IMPRESSION: Persistent multifocal bilateral nodular consolidation. The largest in right midlung measures 1.8 cm. The largest in left midlung measures 1.1 cm. Findings are consistent with persistent multifocal pneumonia or septic emboli. Followup to assure resolution after treatment is recommended.   Electronically Signed   By: Lahoma Crocker   On: 07/03/2013 19:05   [place] Extrem Up Right Ltd  07/07/2013   CLINICAL DATA:  Evaluate for abscess  EXAM: RIGHT UPPER EXTREMITY SOFT TISSUE ULTRASOUND LIMITED. Sonographic evaluation of the forearm within a location of swelling was performed.  COMPARISON:  None.  FINDINGS: A 5 x 5 x 3 mm subcutaneous fluid collection corresponds to the area of concern.  IMPRESSION: There is a 5 x 5 x 3 mm subcutaneous fluid collection within the area of concern.   Electronically Signed   By: Maryclare Bean M.D.   On: 07/07/2013 14:42    Microbiology: No results found for this or any previous visit (from the past 240 hour(s)).   Labs: Basic Metabolic Panel: No results found for this basename: NA, K, CL, CO2, GLUCOSE, BUN, CREATININE, CALCIUM, MG, PHOS,  in the last 168 hours Liver Function Tests: No results found for this basename: AST, ALT, ALKPHOS, BILITOT, PROT, ALBUMIN,  in the last 168 hours No results found for this basename: LIPASE, AMYLASE,  in the last 168 hours No results found for this basename: AMMONIA,  in the last 168 hours CBC: No results found for this basename: WBC, NEUTROABS, HGB, HCT, MCV, PLT,  in the last 168 hours Cardiac Enzymes: No results found for this basename: CKTOTAL, CKMB, CKMBINDEX, TROPONINI,  in the last 168 hours BNP: BNP (last 3 results)  Recent Labs  07/03/13 1919  PROBNP 3241.0*   CBG: No results found for this basename: GLUCAP,  in the last 168 hours     Signed:  Velvet Bathe  [facility] 07/21/2013, 9:30 AM

## 2015-11-27 ENCOUNTER — Inpatient Hospital Stay (HOSPITAL_COMMUNITY)
Admission: EM | Admit: 2015-11-27 | Discharge: 2015-11-30 | DRG: 288 | Discharge status: Against Medical Advice | Payer: Self-pay | Attending: Internal Medicine | Admitting: Internal Medicine

## 2015-11-27 ENCOUNTER — Encounter (HOSPITAL_COMMUNITY): Payer: Self-pay | Admitting: Emergency Medicine

## 2015-11-27 ENCOUNTER — Emergency Department (HOSPITAL_COMMUNITY): Payer: Self-pay

## 2015-11-27 ENCOUNTER — Inpatient Hospital Stay (HOSPITAL_COMMUNITY): Payer: Self-pay

## 2015-11-27 DIAGNOSIS — I2690 Septic pulmonary embolism without acute cor pulmonale: Secondary | ICD-10-CM | POA: Diagnosis present

## 2015-11-27 DIAGNOSIS — I313 Pericardial effusion (noninflammatory): Secondary | ICD-10-CM | POA: Diagnosis present

## 2015-11-27 DIAGNOSIS — D72819 Decreased white blood cell count, unspecified: Secondary | ICD-10-CM

## 2015-11-27 DIAGNOSIS — R161 Splenomegaly, not elsewhere classified: Secondary | ICD-10-CM | POA: Diagnosis present

## 2015-11-27 DIAGNOSIS — I358 Other nonrheumatic aortic valve disorders: Secondary | ICD-10-CM | POA: Diagnosis present

## 2015-11-27 DIAGNOSIS — F1490 Cocaine use, unspecified, uncomplicated: Secondary | ICD-10-CM | POA: Diagnosis present

## 2015-11-27 DIAGNOSIS — F319 Bipolar disorder, unspecified: Secondary | ICD-10-CM | POA: Diagnosis present

## 2015-11-27 DIAGNOSIS — Z9119 Patient's noncompliance with other medical treatment and regimen: Secondary | ICD-10-CM

## 2015-11-27 DIAGNOSIS — N182 Chronic kidney disease, stage 2 (mild): Secondary | ICD-10-CM | POA: Diagnosis present

## 2015-11-27 DIAGNOSIS — R601 Generalized edema: Secondary | ICD-10-CM | POA: Diagnosis present

## 2015-11-27 DIAGNOSIS — D638 Anemia in other chronic diseases classified elsewhere: Secondary | ICD-10-CM | POA: Diagnosis present

## 2015-11-27 DIAGNOSIS — R809 Proteinuria, unspecified: Secondary | ICD-10-CM | POA: Diagnosis present

## 2015-11-27 DIAGNOSIS — F17210 Nicotine dependence, cigarettes, uncomplicated: Secondary | ICD-10-CM | POA: Diagnosis present

## 2015-11-27 DIAGNOSIS — D735 Infarction of spleen: Secondary | ICD-10-CM | POA: Diagnosis present

## 2015-11-27 DIAGNOSIS — D589 Hereditary hemolytic anemia, unspecified: Secondary | ICD-10-CM | POA: Diagnosis present

## 2015-11-27 DIAGNOSIS — F1910 Other psychoactive substance abuse, uncomplicated: Secondary | ICD-10-CM | POA: Diagnosis present

## 2015-11-27 DIAGNOSIS — N179 Acute kidney failure, unspecified: Secondary | ICD-10-CM | POA: Diagnosis present

## 2015-11-27 DIAGNOSIS — Z23 Encounter for immunization: Secondary | ICD-10-CM

## 2015-11-27 DIAGNOSIS — I330 Acute and subacute infective endocarditis: Principal | ICD-10-CM | POA: Diagnosis present

## 2015-11-27 DIAGNOSIS — R823 Hemoglobinuria: Secondary | ICD-10-CM | POA: Diagnosis present

## 2015-11-27 DIAGNOSIS — D61818 Other pancytopenia: Secondary | ICD-10-CM | POA: Diagnosis present

## 2015-11-27 DIAGNOSIS — B9562 Methicillin resistant Staphylococcus aureus infection as the cause of diseases classified elsewhere: Secondary | ICD-10-CM | POA: Diagnosis present

## 2015-11-27 DIAGNOSIS — E8809 Other disorders of plasma-protein metabolism, not elsewhere classified: Secondary | ICD-10-CM | POA: Diagnosis present

## 2015-11-27 LAB — URINALYSIS, ROUTINE W REFLEX MICROSCOPIC
Bilirubin Urine: NEGATIVE
Glucose, UA: NEGATIVE mg/dL
Ketones, ur: NEGATIVE mg/dL
LEUKOCYTES UA: NEGATIVE
Nitrite: NEGATIVE
PROTEIN: 100 mg/dL — AB
Specific Gravity, Urine: 1.015 (ref 1.005–1.030)
pH: 8 (ref 5.0–8.0)

## 2015-11-27 LAB — CBC WITH DIFFERENTIAL/PLATELET
BASOS PCT: 2 %
Basophils Absolute: 0.1 10*3/uL (ref 0.0–0.1)
EOS PCT: 0 %
Eosinophils Absolute: 0 10*3/uL (ref 0.0–0.7)
HEMATOCRIT: 20.1 % — AB (ref 36.0–46.0)
HEMOGLOBIN: 6 g/dL — AB (ref 12.0–15.0)
LYMPHS ABS: 1.2 10*3/uL (ref 0.7–4.0)
Lymphocytes Relative: 46 %
MCH: 23.3 pg — AB (ref 26.0–34.0)
MCHC: 29.9 g/dL — AB (ref 30.0–36.0)
MCV: 77.9 fL — AB (ref 78.0–100.0)
MONOS PCT: 7 %
Monocytes Absolute: 0.2 10*3/uL (ref 0.1–1.0)
NEUTROS ABS: 1.1 10*3/uL — AB (ref 1.7–7.7)
Neutrophils Relative %: 45 %
Platelets: 47 10*3/uL — ABNORMAL LOW (ref 150–400)
RBC: 2.58 MIL/uL — AB (ref 3.87–5.11)
RDW: 20.4 % — ABNORMAL HIGH (ref 11.5–15.5)
WBC Morphology: INCREASED
WBC: 2.6 10*3/uL — ABNORMAL LOW (ref 4.0–10.5)

## 2015-11-27 LAB — PREGNANCY, URINE: PREG TEST UR: NEGATIVE

## 2015-11-27 LAB — RETICULOCYTES
RBC.: 2.76 MIL/uL — ABNORMAL LOW (ref 3.87–5.11)
Retic Count, Absolute: 88.3 10*3/uL (ref 19.0–186.0)
Retic Ct Pct: 3.2 % — ABNORMAL HIGH (ref 0.4–3.1)

## 2015-11-27 LAB — URINE MICROSCOPIC-ADD ON

## 2015-11-27 LAB — RAPID URINE DRUG SCREEN, HOSP PERFORMED
AMPHETAMINES: NOT DETECTED
BENZODIAZEPINES: NOT DETECTED
Barbiturates: NOT DETECTED
Cocaine: POSITIVE — AB
OPIATES: NOT DETECTED
Tetrahydrocannabinol: NOT DETECTED

## 2015-11-27 LAB — DIRECT ANTIGLOBULIN TEST (NOT AT ARMC)
DAT, IgG: NEGATIVE
DAT, complement: NEGATIVE

## 2015-11-27 LAB — CK: Total CK: 15 U/L — ABNORMAL LOW (ref 38–234)

## 2015-11-27 LAB — I-STAT CHEM 8, ED
BUN: 17 mg/dL (ref 6–20)
CALCIUM ION: 1.08 mmol/L — AB (ref 1.12–1.23)
CHLORIDE: 103 mmol/L (ref 101–111)
Creatinine, Ser: 1.5 mg/dL — ABNORMAL HIGH (ref 0.44–1.00)
GLUCOSE: 108 mg/dL — AB (ref 65–99)
HCT: 20 % — ABNORMAL LOW (ref 36.0–46.0)
HEMOGLOBIN: 6.8 g/dL — AB (ref 12.0–15.0)
Potassium: 3.5 mmol/L (ref 3.5–5.1)
Sodium: 141 mmol/L (ref 135–145)
TCO2: 25 mmol/L (ref 0–100)

## 2015-11-27 LAB — WET PREP, GENITAL
CLUE CELLS WET PREP: NONE SEEN
Sperm: NONE SEEN
TRICH WET PREP: NONE SEEN
Yeast Wet Prep HPF POC: NONE SEEN

## 2015-11-27 LAB — PREPARE RBC (CROSSMATCH)

## 2015-11-27 LAB — COMPREHENSIVE METABOLIC PANEL
ALBUMIN: 2.4 g/dL — AB (ref 3.5–5.0)
ALK PHOS: 87 U/L (ref 38–126)
ALT: 7 U/L — ABNORMAL LOW (ref 14–54)
ANION GAP: 10 (ref 5–15)
AST: 17 U/L (ref 15–41)
BUN: 16 mg/dL (ref 6–20)
CO2: 27 mmol/L (ref 22–32)
Calcium: 8.4 mg/dL — ABNORMAL LOW (ref 8.9–10.3)
Chloride: 102 mmol/L (ref 101–111)
Creatinine, Ser: 1.51 mg/dL — ABNORMAL HIGH (ref 0.44–1.00)
GFR calc Af Amer: 53 mL/min — ABNORMAL LOW (ref >60)
GFR calc non Af Amer: 46 mL/min — ABNORMAL LOW (ref >60)
GLUCOSE: 104 mg/dL — AB (ref 65–99)
POTASSIUM: 3.7 mmol/L (ref 3.5–5.1)
SODIUM: 139 mmol/L (ref 135–145)
Total Bilirubin: 0.4 mg/dL (ref 0.3–1.2)
Total Protein: 7 g/dL (ref 6.5–8.1)

## 2015-11-27 LAB — SAVE SMEAR

## 2015-11-27 LAB — APTT: aPTT: 88 seconds — ABNORMAL HIGH (ref 24–37)

## 2015-11-27 LAB — PROTIME-INR
INR: 1.19 (ref 0.00–1.49)
Prothrombin Time: 15.2 seconds (ref 11.6–15.2)

## 2015-11-27 LAB — HCG, QUANTITATIVE, PREGNANCY: hCG, Beta Chain, Quant, S: <1 m[IU]/mL (ref <5)

## 2015-11-27 LAB — LACTATE DEHYDROGENASE: LDH: 121 U/L (ref 98–192)

## 2015-11-27 LAB — I-STAT CG4 LACTIC ACID, ED: Lactic Acid, Venous: 1 mmol/L (ref 0.5–2.0)

## 2015-11-27 MED ORDER — SODIUM CHLORIDE 0.9 % IV SOLN: INTRAVENOUS | Status: DC

## 2015-11-27 MED ORDER — HYDROXYZINE HCL 25 MG PO TABS
25.0000 mg | ORAL_TABLET | ORAL | Status: DC | PRN
  Administered 2015-11-27 – 2015-11-29 (×7): 25 mg via ORAL
  Filled 2015-11-27 (×7): qty 1

## 2015-11-27 MED ORDER — SODIUM CHLORIDE 0.9% FLUSH
3.0000 mL | Freq: Two times a day (BID) | INTRAVENOUS | Status: DC
  Administered 2015-11-27 – 2015-11-30 (×5): 3 mL via INTRAVENOUS

## 2015-11-27 MED ORDER — IOHEXOL 300 MG/ML  SOLN
80.0000 mL | Freq: Once | INTRAMUSCULAR | Status: AC | PRN
  Administered 2015-11-27: 80 mL via INTRAVENOUS

## 2015-11-27 MED ORDER — ACETAMINOPHEN 325 MG PO TABS
650.0000 mg | ORAL_TABLET | Freq: Four times a day (QID) | ORAL | Status: DC | PRN
  Administered 2015-11-27 – 2015-11-29 (×4): 650 mg via ORAL
  Filled 2015-11-27 (×4): qty 2

## 2015-11-27 MED ORDER — SODIUM CHLORIDE 0.9 % IV BOLUS (SEPSIS)
1000.0000 mL | Freq: Once | INTRAVENOUS | Status: AC
  Administered 2015-11-27: 1000 mL via INTRAVENOUS

## 2015-11-27 MED ORDER — SODIUM CHLORIDE 0.9 % IV SOLN
10.0000 mL/h | Freq: Once | INTRAVENOUS | Status: AC
  Administered 2015-11-27: 10 mL/h via INTRAVENOUS

## 2015-11-27 NOTE — ED Notes (Addendum)
Pt arrives via POV from [place]. Pt reports recently hospitalized at [facility] for infection in aortic valve. Pt reports signed out AMA two weeks ago. Arrives pale, swollen, dizzy upon standing. States has had vaginal bleeding since signing out AMA from hospital  Reports was told her hemoglobin is low. Denies recent fever. Needs medical clearance before returning to [facility] for narcotic detox. PT is not to have narcotics or benzos during treatment per paperwork

## 2015-11-27 NOTE — ED Notes (Signed)
Right when the bag of normal saline started, Dr. Melburn Hake walked in to assess patient and he stated to not start the fluids. Fluids stopped.

## 2015-11-27 NOTE — ED Notes (Signed)
Pt back from CT - pt now being transferred to [facility]

## 2015-11-27 NOTE — H&P (Signed)
Date: 11/27/2015               Patient Name:  Molly [name] MRN: [id number]  DOB: July 12, 1986 Age / Sex: 30 y.o., female   PCP: No primary care provider on file.         Medical Service: Internal Medicine Teaching Service         Attending Physician: Dr. Talbot Grumbling    First Contact: Dr. Loleta Chance Pager: [phone]  Second Contact: Dr. Charlott Rakes Pager: [phone]       After Hours (After 5p/  First Contact Pager: [phone]  weekends / holidays): Second Contact Pager: [phone]   Chief Complaint: "My left side is killing me."  History of Present Illness:  Molly [name] is a 30 year old lady with bipolar disorder and MRSA endocarditis, splenic infarction, and septic pulmonary emboli presenting with left lower quadrant abdominal pain.  In early January, she started spiking fevers so she went to [facility]. During that admission, multiple blood cultures grew MRSA, TTE and TEEs showed a 7x20mm vegetation on the aortic valve, CT chest showed numerous septic pulmonary emboli, CT abdomen showed a massively enlarged spleen of 22cm with infarction, and MRI did not show discitis. Over the course of two weeks, she left against medical advice at least twice; it appears he received about one week total of IV vancomycin during that time frame. On January 18, she left AMA for good, and started using IV Opana again. During that time, she's been having progressively worsening left lower quadrant abdominal and left flank pain. She's had a few fevers but denies any worsening lower extremity edema, orthopnea, chest pain, loss of vision, or focal numbness or weakness. Review of systems was otherwise non-revealing. Two days ago, she admitted herself to a rehabilitation program in [place]. A doctor there checked a CBC this morning and noted her to be severely anemic, so he referred her to [facility] emergency department.  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 rate 20, last menstrual period 11/11/2015, SpO2 100 %.  General: young lady resting in bed comfortably, appropriately conversational HEENT: no scleral icterus, extra-ocular muscles intact, oropharynx without lesions Cardiac: regular rate and rhythm, no rubs, murmurs or gallops Pulm: breathing well, clear to auscultation bilaterally Abd: bowel sounds normal, soft, massive splenomegaly with tenderness to palpation, without overt ecchyomses Ext: warm and well perfused, with anasarca, 2+ pedal edema to the thighs, tenderness to upper thoracic vertebral processes Lymph: no cervical or supraclavicular lymphadenopathy Skin: tender red 77mm papules on bilateral palmar hands, track marks on dorsal hands Neuro: alert and oriented X3, cranial nerves II-XII grossly intact, moving all extremities well  Lab results: Basic Metabolic Panel:  Recent Labs  11/27/15 1644 11/27/15 1712  NA 139 141  K 3.7 3.5  CL 102 103  CO2 27  --   GLUCOSE 104* 108*  BUN 16 17  CREATININE 1.51* 1.50*  CALCIUM 8.4*  --    Liver Function Tests:  Recent Labs  11/27/15 1644  AST 17  ALT 7*  ALKPHOS 87  BILITOT 0.4  PROT 7.0  ALBUMIN 2.4*   CBC:  Recent Labs  11/27/15 1644 11/27/15 1712  WBC 2.6*  --   NEUTROABS PENDING  --   HGB 6.0* 6.8*  HCT 20.1* 20.0*  MCV 77.9*  --   PLT 47*  --    Coagulation:  Recent Labs  11/27/15 1644  LABPROT 15.2  INR 1.19    Urinalysis:  Recent Labs  11/27/15 1624  COLORURINE AMBER*  LABSPEC 1.015  PHURINE 8.0  GLUCOSEU NEGATIVE  HGBUR LARGE*  BILIRUBINUR NEGATIVE  KETONESUR NEGATIVE  PROTEINUR 100*  NITRITE NEGATIVE  LEUKOCYTESUR NEGATIVE   Imaging results:  No results found.  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 She does not have a surgical abdomen concerning for splenic rupture right now. We'll get an abdominal CT scan to further evaluate and may need to consult general surgery to see if she's a candidate for splenectomy. -Non-contrast abdominal CT ordered -May need to consult general surgery  MRSA endocarditis: I imagine the vegetation is still there, but we'll check more blood cultures and another TTE before starting antibiotics. She was only treated for about 7 days total earlier in January as she left AMA several times. -Checking another TTE -Check more blood cultures tomorrow  Pancytopenia: I wonder if the anemia is hemorrhage into her enlarging spleen, hemolytic anemia, anemia of chronic disease, anemia from renal disease, or bone marrow suppression. We'll check a reticulocyte count to look for the latter. Her thrombocytopenia could be from splenic sequestration, microangiopathic hemolytic anemia such as hemolytic uremic syndrome given her renal dysfunction, or bone marrow suppression. This actually got much worse from 3 weeks ago from 150 to 50. Her leukopenia could be from her infection or again, bone marrow suppression. -CT abdomen per above -Checking hemolysis labs -Re-check iron panel -Reticulocyte count  Acute kidney injury: She has hemoglobinuria and proteinuria with a low serum albumin. I'm wondering if this is a septic embolus to the renal artery. However she also has  anasarca with hypoalbuminemia, so I'm wondering if she may have nephrotic syndrome. Hemolytic uremic syndrome is another consideration given her anemia and thrombocytopenia. We'll hold in diuresis until we get a better idea of what's going on. -Checking spot protein-creatinine ratio  Septic pulmonary emboli: Noted during previous hospitalization. She's saturating well on room air right now. -Will hold on lung imaging besides CXR  Bipolar disorder: I don't think she's manic nor depressed. Her affect is normal but her history is very concerning that she may want to leave AMA this admission. -Social work consulted  Dispo: Disposition is deferred at this time, awaiting improvement of current medical problems.  The patient does not have a current PCP (No primary care provider on file.) and does need an [facility] hospital follow-up appointment after discharge.  The patient does not know have transportation limitations that hinder transportation to clinic appointments.  Signed: Loleta Chance, MD 11/27/2015, 5:10 PM

## 2015-11-27 NOTE — ED Notes (Signed)
Pts pelvic exam performed by Dr. Lanetta Inch at 1606 , no Dr. Jarome Matin. Error in charting.

## 2015-11-27 NOTE — ED Notes (Addendum)
Pt reported to this RN that she is here today because her back is hurting her (8/10) and is concerned about swelling to her body (abdomen, hands, legs, eyes).

## 2015-11-27 NOTE — ED Notes (Signed)
Attempted IV x2 with [place]. Unable to access

## 2015-11-27 NOTE — ED Provider Notes (Signed)
CSN: [id number]     Arrival date & time 11/27/15  16 History   First MD Initiated Contact with Patient 11/27/15 1533     Chief Complaint  Patient presents with  . Vaginal Bleeding  . Fatigue  . Medical Clearance     (Consider location/radiation/quality/duration/timing/severity/associated sxs/prior Treatment) Patient is a 30 y.o. female presenting with vaginal bleeding and general illness. The history is provided by the patient.  Vaginal Bleeding Associated symptoms: abdominal pain, fatigue and fever   Associated symptoms: no dysuria, no nausea and no vaginal discharge   Illness Location:  Recently left [facility] from [facility] for endocarditis Quality:  Comes in with worsening back pain and "I realized that I do need treatment" Severity:  Unable to specify Onset quality:  Gradual Duration: weeks. Timing:  Constant Progression:  Waxing and waning Chronicity:  New Context:  IVDU. No antibiotics for about 2 weeks Relieved by:  Nothing Worsened by:  Nothing Ineffective treatments:  Nothing Associated symptoms: abdominal pain, fatigue and fever   Associated symptoms: no chest pain, no cough, no diarrhea, no headaches, no nausea, no rash, no shortness of breath and no vomiting   Associated symptoms comment:  Reports vaginal spotting for 3-4 weeks ever since admission to [facility]   Past Medical History  Diagnosis Date  . IV drug user   . MRSA (methicillin resistant Staphylococcus aureus) infection   . Abscess of skin     buttock - most recently 2009/10   Past Surgical History  Procedure Laterality Date  . Tee without cardioversion N/A 07/04/2013    Procedure: TRANSESOPHAGEAL ECHOCARDIOGRAM (TEE);  Surgeon: Larey Dresser, MD;  Location: [facility];  Service: Cardiovascular;  Laterality: N/A;  . Tonsillectomy      30 years old   History reviewed. No pertinent family history. Social History  Substance Use Topics  . Smoking status: Current Every Day Smoker -- 1.00 packs/day    Types:  Cigarettes  . Smokeless tobacco: None  . Alcohol Use: No   OB History    No data available     Review of Systems  Constitutional: Positive for fever and fatigue.  HENT: Negative.   Eyes: Negative for visual disturbance.  Respiratory: Negative for cough and shortness of breath.   Cardiovascular: Negative for chest pain.  Gastrointestinal: Positive for abdominal pain. Negative for nausea, vomiting and diarrhea.  Genitourinary: Positive for vaginal bleeding. Negative for dysuria, hematuria, vaginal discharge and vaginal pain.  Musculoskeletal: Negative.   Skin: Positive for pallor. Negative for rash and wound.  Neurological: Negative for headaches.      Allergies  Review of patient's allergies indicates no known allergies.  Home Medications   Prior to Admission medications   Medication Sig Start Date End Date Taking? Authorizing Provider  acetaminophen (TYLENOL) 500 MG tablet Take 1,000 mg by mouth every 6 (six) hours as needed for pain.   Yes Historical Provider, MD  amoxicillin (AMOXIL) 500 MG capsule take 1 capsule by mouth twice a day for 7 days 11/21/15  Yes Historical Provider, MD  furosemide (LASIX) 20 MG tablet Take 20 mg by mouth daily. 11/21/15  Yes Historical Provider, MD  ibuprofen (ADVIL,MOTRIN) 200 MG tablet Take 800 mg by mouth every 6 (six) hours as needed for moderate pain.   Yes Historical Provider, MD   BP 157/108 mmHg  Pulse 67  Temp(Src) 98.6 F (37 C) (Oral)  Resp 13  Ht 5\' 2"  (1.575 m)  Wt 55.1 kg  BMI 22.21 kg/m2  SpO2 100%  LMP 11/11/2015 Physical Exam  Constitutional: She is oriented to person, place, and time. Vital signs are normal. She appears ill.  HENT:  Head: Normocephalic and atraumatic.  Eyes: EOM are normal. Pupils are equal, round, and reactive to light.  Neck: Normal range of motion. Neck supple.  Cardiovascular: Normal rate, regular rhythm and intact distal pulses.   Murmur (systolic murmur best at RUSB) heard. Pulmonary/Chest:  Effort normal and breath sounds normal. No respiratory distress. She exhibits no tenderness.  Abdominal: Soft. She exhibits no distension. There is tenderness (epigastric and RUQ). There is no rebound and no guarding.  Musculoskeletal: Normal range of motion. She exhibits edema (3+ pitting edema LE). She exhibits no tenderness.  Red, round, nodules scattered on palms  Neurological: She is alert and oriented to person, place, and time. No cranial nerve deficit. She exhibits normal muscle tone. Coordination normal.  Skin: Skin is warm and dry. No erythema. There is pallor.  Psychiatric: She has a normal mood and affect.  Nursing note and vitals reviewed.   ED Course  Procedures (including critical care time) Labs Review Labs Reviewed  WET PREP, GENITAL - Abnormal; Notable for the following:    WBC, Wet Prep HPF POC MANY (*)    All other components within normal limits  CBC WITH DIFFERENTIAL/PLATELET - Abnormal; Notable for the following:    WBC 2.6 (*)    RBC 2.58 (*)    Hemoglobin 6.0 (*)    HCT 20.1 (*)    MCV 77.9 (*)    MCH 23.3 (*)    MCHC 29.9 (*)    RDW 20.4 (*)    Platelets 47 (*)    Neutro Abs 1.1 (*)    All other components within normal limits  COMPREHENSIVE METABOLIC PANEL - Abnormal; Notable for the following:    Glucose, Bld 104 (*)    Creatinine, Ser 1.51 (*)    Calcium 8.4 (*)    Albumin 2.4 (*)    ALT 7 (*)    GFR calc non Af Amer 46 (*)    GFR calc Af Amer 53 (*)    All other components within normal limits  URINALYSIS, ROUTINE W REFLEX MICROSCOPIC (NOT AT [facility]) - Abnormal; Notable for the following:    Color, Urine AMBER (*)    APPearance HAZY (*)    Hgb urine dipstick LARGE (*)    Protein, ur 100 (*)    All other components within normal limits  URINE MICROSCOPIC-ADD ON - Abnormal; Notable for the following:    Squamous Epithelial / LPF 0-5 (*)    Bacteria, UA FEW (*)    All other components within normal limits  APTT - Abnormal; Notable for the  following:    aPTT 88 (*)    All other components within normal limits  RETICULOCYTES - Abnormal; Notable for the following:    Retic Ct Pct 3.2 (*)    RBC. 2.76 (*)    All other components within normal limits  CK - Abnormal; Notable for the following:    Total CK 15 (*)    All other components within normal limits  URINE RAPID DRUG SCREEN, HOSP PERFORMED - Abnormal; Notable for the following:    Cocaine POSITIVE (*)    All other components within normal limits  I-STAT CHEM 8, ED - Abnormal; Notable for the following:    Creatinine, Ser 1.50 (*)    Glucose, Bld 108 (*)    Calcium, Ion 1.08 (*)    Hemoglobin  6.8 (*)    HCT 20.0 (*)    All other components within normal limits  CULTURE, BLOOD (ROUTINE X 2)  CULTURE, BLOOD (ROUTINE X 2)  MRSA PCR SCREENING  HCG, QUANTITATIVE, PREGNANCY  PROTIME-INR  PREGNANCY, URINE  LACTATE DEHYDROGENASE  SAVE SMEAR  RPR  HAPTOGLOBIN  PROTEIN / CREATININE RATIO, URINE  FIBRINOGEN  CBC WITH DIFFERENTIAL/PLATELET  SEDIMENTATION RATE  URINALYSIS, ROUTINE W REFLEX MICROSCOPIC (NOT AT [facility])  TECHNOLOGIST SMEAR REVIEW  COMPREHENSIVE METABOLIC PANEL  CBC  PROTIME-INR  C3 COMPLEMENT  C4 COMPLEMENT  I-STAT CG4 LACTIC ACID, ED  TYPE AND SCREEN  PREPARE RBC (CROSSMATCH)  DIRECT ANTIGLOBULIN TEST (NOT AT [facility])  GC/CHLAMYDIA PROBE AMP ([place]) NOT AT [facility]     EKG Interpretation None      MDM   Final diagnoses:  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 Preparation: Patient was prepped and draped in the usual sterile fashion.  Vein Location: LUE  Ultrasound Guided  Gauge: 18  Normal blood return and flush without difficulty Patient tolerance: Patient tolerated the procedure well with no immediate complications.     Patient is a  30 year old female who recently left [facility] after being treated for infective endocarditis. She had  been out using IV drugs for 2 weeks and came back today because she realized she was never going to get better. she was not happy with their care thus decided to come here. She otherwise reports stable generalized fatigue, intermittent fevers, but no chest pain or shortness of breath. She does report vaginal spotting for the past 3 weeks. Further history and exam as above notable for stable vital signs, pale-appearing female in no acute distress, and pelvic exam with IUD in place without obvious bleeding. I have reviewed records from [facility] which confirmed MRSA endocarditis. Patient found to be pancytopenic and a creatinine of 1.51. Patient given 2 units of RBCs for hb of 6. Patient will be admitted to internal medicine for further management and evaluation    Heriberto Antigua, MD 11/28/15 [id number]  Ezequiel Essex, MD 11/28/15 [id number]

## 2015-11-27 NOTE — ED Notes (Signed)
Phlebotomy at the bedside  

## 2015-11-27 NOTE — ED Notes (Signed)
Report attempted 

## 2015-11-27 NOTE — ED Notes (Signed)
Pt reports she last used Opana on Monday. Denies using Heroin

## 2015-11-27 NOTE — ED Notes (Signed)
Pelvic cart set up and at bedside.

## 2015-11-28 ENCOUNTER — Inpatient Hospital Stay (HOSPITAL_COMMUNITY): Payer: Self-pay

## 2015-11-28 DIAGNOSIS — D735 Infarction of spleen: Secondary | ICD-10-CM

## 2015-11-28 DIAGNOSIS — F1910 Other psychoactive substance abuse, uncomplicated: Secondary | ICD-10-CM

## 2015-11-28 DIAGNOSIS — I2690 Septic pulmonary embolism without acute cor pulmonale: Secondary | ICD-10-CM

## 2015-11-28 DIAGNOSIS — I330 Acute and subacute infective endocarditis: Principal | ICD-10-CM

## 2015-11-28 DIAGNOSIS — D649 Anemia, unspecified: Secondary | ICD-10-CM

## 2015-11-28 DIAGNOSIS — D591 Other autoimmune hemolytic anemias: Secondary | ICD-10-CM

## 2015-11-28 DIAGNOSIS — R7881 Bacteremia: Secondary | ICD-10-CM

## 2015-11-28 DIAGNOSIS — B9562 Methicillin resistant Staphylococcus aureus infection as the cause of diseases classified elsewhere: Secondary | ICD-10-CM

## 2015-11-28 DIAGNOSIS — F1490 Cocaine use, unspecified, uncomplicated: Secondary | ICD-10-CM

## 2015-11-28 DIAGNOSIS — D696 Thrombocytopenia, unspecified: Secondary | ICD-10-CM

## 2015-11-28 LAB — CBC WITH DIFFERENTIAL/PLATELET
Basophils Absolute: 0 10*3/uL (ref 0.0–0.1)
Basophils Relative: 0 %
Eosinophils Absolute: 0 10*3/uL (ref 0.0–0.7)
Eosinophils Relative: 0 %
HCT: 21.8 % — ABNORMAL LOW (ref 36.0–46.0)
HEMOGLOBIN: 6.6 g/dL — AB (ref 12.0–15.0)
LYMPHS ABS: 1.1 10*3/uL (ref 0.7–4.0)
Lymphocytes Relative: 31 %
MCH: 24 pg — AB (ref 26.0–34.0)
MCHC: 30.3 g/dL (ref 30.0–36.0)
MCV: 79.3 fL (ref 78.0–100.0)
MONOS PCT: 7 %
Monocytes Absolute: 0.2 10*3/uL (ref 0.1–1.0)
NEUTROS ABS: 2.3 10*3/uL (ref 1.7–7.7)
NEUTROS PCT: 63 %
Platelets: 208 10*3/uL (ref 150–400)
RBC: 2.75 MIL/uL — ABNORMAL LOW (ref 3.87–5.11)
RDW: 19.8 % — AB (ref 11.5–15.5)
WBC: 3.6 10*3/uL — ABNORMAL LOW (ref 4.0–10.5)

## 2015-11-28 LAB — PROTIME-INR
INR: 1.23 (ref 0.00–1.49)
Prothrombin Time: 15.7 seconds — ABNORMAL HIGH (ref 11.6–15.2)

## 2015-11-28 LAB — COMPREHENSIVE METABOLIC PANEL
ALK PHOS: 72 U/L (ref 38–126)
ALT: 6 U/L — AB (ref 14–54)
AST: 15 U/L (ref 15–41)
Albumin: 2.3 g/dL — ABNORMAL LOW (ref 3.5–5.0)
Anion gap: 9 (ref 5–15)
BILIRUBIN TOTAL: 0.3 mg/dL (ref 0.3–1.2)
BUN: 14 mg/dL (ref 6–20)
CALCIUM: 8.2 mg/dL — AB (ref 8.9–10.3)
CO2: 24 mmol/L (ref 22–32)
CREATININE: 1.46 mg/dL — AB (ref 0.44–1.00)
Chloride: 107 mmol/L (ref 101–111)
GFR calc Af Amer: 56 mL/min — ABNORMAL LOW (ref >60)
GFR, EST NON AFRICAN AMERICAN: 48 mL/min — AB (ref >60)
Glucose, Bld: 92 mg/dL (ref 65–99)
Potassium: 3.8 mmol/L (ref 3.5–5.1)
Sodium: 140 mmol/L (ref 135–145)
TOTAL PROTEIN: 6.9 g/dL (ref 6.5–8.1)

## 2015-11-28 LAB — PREPARE RBC (CROSSMATCH)

## 2015-11-28 LAB — FIBRINOGEN: Fibrinogen: 224 mg/dL (ref 204–475)

## 2015-11-28 LAB — SEDIMENTATION RATE: SED RATE: 132 mm/h — AB (ref 0–22)

## 2015-11-28 LAB — HEMOGLOBIN AND HEMATOCRIT, BLOOD
HCT: 24.9 % — ABNORMAL LOW (ref 36.0–46.0)
HEMOGLOBIN: 7.8 g/dL — AB (ref 12.0–15.0)

## 2015-11-28 LAB — MRSA PCR SCREENING: MRSA by PCR: NEGATIVE

## 2015-11-28 LAB — GC/CHLAMYDIA PROBE AMP (~~LOC~~) NOT AT ARMC
CHLAMYDIA, DNA PROBE: NEGATIVE
Neisseria Gonorrhea: NEGATIVE

## 2015-11-28 LAB — TECHNOLOGIST SMEAR REVIEW

## 2015-11-28 LAB — RPR: RPR Ser Ql: NONREACTIVE

## 2015-11-28 MED ORDER — SENNOSIDES-DOCUSATE SODIUM 8.6-50 MG PO TABS
1.0000 | ORAL_TABLET | Freq: Two times a day (BID) | ORAL | Status: DC
  Administered 2015-11-30: 1 via ORAL
  Filled 2015-11-28 (×3): qty 1

## 2015-11-28 MED ORDER — DAPTOMYCIN 500 MG IV SOLR
340.0000 mg | INTRAVENOUS | Status: AC
Start: 2015-11-28 — End: 2015-11-28
  Administered 2015-11-28: 340 mg via INTRAVENOUS
  Filled 2015-11-28: qty 6.8

## 2015-11-28 MED ORDER — SODIUM CHLORIDE 0.9 % IV SOLN
Freq: Once | INTRAVENOUS | Status: AC
  Administered 2015-11-28: 12:00:00 via INTRAVENOUS

## 2015-11-28 MED ORDER — SODIUM CHLORIDE 0.9 % IV SOLN
500.0000 mg | INTRAVENOUS | Status: DC
  Administered 2015-11-29 – 2015-11-30 (×2): 500 mg via INTRAVENOUS
  Filled 2015-11-28 (×3): qty 10

## 2015-11-28 MED ORDER — OXYCODONE HCL 5 MG PO TABS
5.0000 mg | ORAL_TABLET | ORAL | Status: DC | PRN
  Administered 2015-11-29 – 2015-11-30 (×8): 5 mg via ORAL
  Filled 2015-11-28 (×8): qty 1

## 2015-11-28 MED ORDER — OXYCODONE HCL ER 10 MG PO T12A
10.0000 mg | EXTENDED_RELEASE_TABLET | Freq: Two times a day (BID) | ORAL | Status: DC
  Administered 2015-11-28: 10 mg via ORAL
  Filled 2015-11-28: qty 1

## 2015-11-28 MED ORDER — INFLUENZA VAC SPLIT QUAD 0.5 ML IM SUSY
0.5000 mL | PREFILLED_SYRINGE | INTRAMUSCULAR | Status: AC
  Administered 2015-11-29: 0.5 mL via INTRAMUSCULAR
  Filled 2015-11-28: qty 0.5

## 2015-11-28 MED ORDER — OXYCODONE HCL 5 MG PO TABS
5.0000 mg | ORAL_TABLET | Freq: Once | ORAL | Status: AC
  Administered 2015-11-28: 5 mg via ORAL
  Filled 2015-11-28: qty 1

## 2015-11-28 MED ORDER — OXYCODONE HCL ER 15 MG PO T12A
15.0000 mg | EXTENDED_RELEASE_TABLET | Freq: Two times a day (BID) | ORAL | Status: DC
  Administered 2015-11-28: 15 mg via ORAL
  Filled 2015-11-28: qty 1

## 2015-11-28 NOTE — Progress Notes (Signed)
Patient ID: Molly [name], female   DOB: 1986-01-30, 30 y.o.   MRN: [id number]   Subjective: Molly [name] is feeling about the same as yesterday; she's still having left sided abdominal pain. She denies any chest pain, worsening orthopnea, fevers, or other complaints.  Objective: Vital signs in last 24 hours: Filed Vitals:   11/27/15 2300 11/28/15 0000 11/28/15 0436 11/28/15 0800  BP: 157/108 145/125 143/113 141/115  Pulse: 67 81 61 79  Temp: 98.6 F (37 C)  98.2 F (36.8 C) 98.9 F (37.2 C)  TempSrc: Oral  Oral Oral  Resp: 13 17 16 19   Height:      Weight:   55.1 kg (121 lb 7.6 oz)   SpO2: 100% 99% 97% 97%   Physical exam: General: young lady resting in bed comfortably, appropriately conversational HEENT: no scleral icterus, extra-ocular muscles intact, oropharynx without lesions Cardiac: regular rate and rhythm, no rubs, murmurs or gallops Pulm: breathing well, clear to auscultation bilaterally Abd: bowel sounds normal, soft, massive splenomegaly with tenderness to palpation, without overt ecchyomses, unchanged from yesterday Ext: warm and well perfused, with anasarca, 2+ pedal edema to the thighs, tenderness to upper thoracic vertebral processes Lymph: no cervical or supraclavicular lymphadenopathy Skin: tender red 75mm papules on bilateral palmar hands, track marks on dorsal hands Neuro: alert and oriented X3, cranial nerves II-XII grossly intact, moving all extremities well  Lab Results: Basic Metabolic Panel:  Recent Labs Lab 11/27/15 1644 11/27/15 1712 11/28/15 0831  NA 139 141 140  K 3.7 3.5 3.8  CL 102 103 107  CO2 27  --  24  GLUCOSE 104* 108* 92  BUN 16 17 14   CREATININE 1.51* 1.50* 1.46*  CALCIUM 8.4*  --  8.2*   Liver Function Tests:  Recent Labs Lab 11/27/15 1644 11/28/15 0831  AST 17 15  ALT 7* 6*  ALKPHOS 87 72  BILITOT 0.4 0.3  PROT 7.0 6.9  ALBUMIN 2.4* 2.3*   CBC:  Recent Labs Lab 11/27/15 1644 11/27/15 1712 11/28/15 0831  WBC 2.6*   --  3.6*  NEUTROABS 1.1*  --  2.3  HGB 6.0* 6.8* 6.6*  HCT 20.1* 20.0* 21.8*  MCV 77.9*  --  79.3  PLT 47*  --  208   Cardiac Enzymes:  Recent Labs Lab 11/27/15 2248  CKTOTAL 15*   Coagulation:  Recent Labs Lab 11/27/15 1644 11/28/15 0839  LABPROT 15.2 15.7*  INR 1.19 1.23   Anemia Panel:  Recent Labs Lab 11/27/15 2247  RETICCTPCT 3.2*   Urine Drug Screen: Drugs of Abuse     Component Value Date/Time   LABOPIA NONE DETECTED 11/27/2015 1624   COCAINSCRNUR POSITIVE* 11/27/2015 1624   LABBENZ NONE DETECTED 11/27/2015 1624   AMPHETMU NONE DETECTED 11/27/2015 1624   THCU NONE DETECTED 11/27/2015 1624   LABBARB NONE DETECTED 11/27/2015 1624    Urinalysis:  Recent Labs Lab 11/27/15 1624  COLORURINE AMBER*  LABSPEC 1.015  PHURINE 8.0  GLUCOSEU NEGATIVE  HGBUR LARGE*  BILIRUBINUR NEGATIVE  KETONESUR NEGATIVE  PROTEINUR 100*  NITRITE NEGATIVE  LEUKOCYTESUR NEGATIVE   Studies/Results: Dg Chest 2 View  11/27/2015  CLINICAL DATA:  Subacute bacterial endocarditis. EXAM: CHEST  2 VIEW COMPARISON:  November 20, 2015 FINDINGS: The heart size and mediastinal contours are stable. The heart size is enlarged. There is atelectasis of bilateral lung bases. Small left pleural effusion is identified. There is no pulmonary edema or focal pneumonia. The visualized skeletal structures are unremarkable. IMPRESSION: Cardiomegaly. Mild atelectasis of bilateral  lung bases. Small left pleural effusion. Electronically Signed   By: Abelardo Diesel M.D.   On: 11/27/2015 19:29   Ct Abdomen Pelvis W Contrast  11/27/2015  CLINICAL DATA:  Acute onset of MRSA endocarditis. Splenomegaly. Initial encounter. EXAM: CT ABDOMEN AND PELVIS WITH CONTRAST TECHNIQUE: Multidetector CT imaging of the abdomen and pelvis was performed using the standard protocol following bolus administration of intravenous contrast. CONTRAST:  91mL OMNIPAQUE IOHEXOL 300 MG/ML  SOLN COMPARISON:  CT of the abdomen and pelvis from  10/30/2015 FINDINGS: Trace right and small left pleural effusions are noted, with mild left basilar opacity, likely reflecting atelectasis. A small pericardial effusion is identified. Trace ascites is noted. There is persistent marked enlargement of the spleen, though it appears somewhat decreased in size from the prior study, measuring 20.4 cm in length. The previous noted hypodensity within the spleen corresponds to a region of evolving infarct. The spleen is unremarkable in appearance. The gallbladder is grossly unremarkable. The pancreas and adrenal glands are grossly unremarkable. The kidneys are unremarkable in appearance. No significant perinephric stranding is appreciated. There is no evidence of hydronephrosis. No renal or ureteral stones are identified. The stomach is largely decompressed and grossly unremarkable, though difficult to fully assess. No acute vascular abnormalities are seen. The appendix is grossly remarkable in appearance, though difficult to fully characterize. Mild apparent wall thickening is suggested along the ascending colon, which may reflect a mild infectious or inflammatory process. A small amount of free fluid is seen within the pelvis. The bladder is mildly distended and grossly unremarkable. The uterus is grossly unremarkable in appearance, with an intrauterine device noted in expected position at the fundus of the uterus. The ovaries are relatively symmetric. No suspicious adnexal masses are seen. Mild diffuse soft tissue edema is noted along the abdominal and pelvic wall, compatible with mild anasarca. No acute osseous abnormalities are identified. IMPRESSION: 1. Persistent marked enlargement of the spleen, though it appears mildly decreased in size from the prior study, measuring 20.4 cm in length. Region of evolving infarct noted within the spleen, relatively small compared to the size of the spleen. 2. Mild apparent wall thickening suggested along the ascending colon, which  may reflect a mild infectious or inflammatory process. 3. Trace right and small left pleural effusions, with mild left basilar opacity, likely reflecting atelectasis. 4. Trace abdominopelvic ascites noted. 5. Small pericardial effusion seen. 6. Mild diffuse soft tissue edema along the abdominal and pelvic wall, compatible with mild anasarca. Electronically Signed   By: Garald Balding M.D.   On: 11/27/2015 21:47   Medications: I have reviewed the patient's current medications. Scheduled Meds: . [START ON 11/29/2015] DAPTOmycin (CUBICIN)  IV  500 mg Intravenous Q24H  . oxyCODONE  10 mg Oral Q12H  . sodium chloride flush  3 mL Intravenous Q12H   Continuous Infusions:  PRN Meds:.acetaminophen, hydrOXYzine   Assessment/Plan:  Massive splenomegaly with infarction: Her chief complaint is left lower quadrant abdominal pain. We consulted general surgery who doesn't feel she's a candidate for splenectomy. I think her enlarged spleen explains her thrombocytopenia. We'll continue to manage her pain the best we can to prevent her from leaving, so she can get the full course of IV antibiotics. 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 We'll check another transthoracic echo today. She's hemodynamically stable right now so I don't expect it's enlarged.  We're also checking blood cultures to ensure she doesn't have a new organism in addition to MRSA. Given her thrombocytopenia and renal dysfunction, we'll start her on daptomycin. Her initial CK level was normal; we'll draw weekly CKs to look for daptomycin-induced myositis. I think it would be best if she stays in the hospital or perhaps CIR for the length of her treatment to ensure she is adequately treated. -Started daptomycin, stop date March 24 -Checking more blood culture today and will draw another set on  Sunday -Can place PICC once blood cultures are negative for 2-3 days -Follow up transthoracic echo today -Likely discharge out of step-down today pending echo results  Anemia: Her reticulocyte count was low so I think this is bone marrow suppression from her endocarditis. Her hemoglobin only rose from 6.0 to 6.6 after 1 unit, but I don't think she's hemolyzing as she tolerated the transfusion well and her hemolysis labs were normal. We'll transfuse her another unit now. -Transfuse 1U PRBCs -Daily CBCs  Acute kidney injury from suspected glomerulonephritis: She has hemoglobinuria and anasarca; I'm wondering if this is glomerulonephritis from her endocarditis. We'll check a C3 level and may need to consult nephrology should her creatinine start rising. -Follow up spot protein-creatinine ratio -Follow up C3 level  Septic pulmonary emboli: Noted during previous hospitalization. She's saturating well on room air right now. -Will hold on lung imaging besides CXR  Bipolar disorder: I don't think she's manic nor depressed. Her affect is normal but her history is very concerning that she may want to leave AMA this admission. -Social work consulted  Dispo: Disposition is deferred at this time, awaiting improvement of current medical problems. She may stay here for the duration of her antibiotic therapy.  The patient does not have a current PCP (No Pcp Per Patient) and does need an [facility] hospital follow-up appointment after discharge.  The patient does have transportation limitations that hinder transportation to clinic appointments.  .Services Needed at time of discharge: Y = Yes, Blank = No PT:   OT:   RN:   Equipment:   Other:     LOS: 1 day   Loleta Chance, MD 11/28/2015, 11:23 AM

## 2015-11-28 NOTE — Consult Note (Signed)
Patient ID: Molly [name] MRN: [id number], DOB/AGE: 06-18-1986   Admit date: 11/27/2015   Primary Physician: No PCP Per Patient Primary Cardiologist: New  Pt. Profile:  30 y/o medically noncompliant female with h/o bipolar disorder, drug use and MRSA endocarditis, first diagnosed 10/2015, with splenic infarction and septic pulmonary emboli, who presented back 11/27/15 with worsening LLQ pain. Cardiology consulted for endocarditis.   Problem List  Past Medical History  Diagnosis Date  . IV drug user   . MRSA (methicillin resistant Staphylococcus aureus) infection   . Abscess of skin     buttock - most recently 2009/10    Past Surgical History  Procedure Laterality Date  . Tee without cardioversion N/A 07/04/2013    Procedure: TRANSESOPHAGEAL ECHOCARDIOGRAM (TEE);  Surgeon: Larey Dresser, MD;  Location: [facility] ENDOSCOPY;  Service: Cardiovascular;  Laterality: N/A;  . Tonsillectomy      30 years old     Allergies  No Known Allergies  HPI  30 y/o medically noncompliant female with h/o bipolar disorder, drug use and MRSA endocarditis, first diagnosed 10/2015, with splenic infarction and septic pulmonary emboli, who presented back 11/27/15 with worsening LLQ pain. Per records she was initially diagnosed at [facility] last month. Multiple blood cultures were positive for MRSA. TEE showed a 7x71mm vegetation on the aortic valve and she was found to have multiple septic pulmonary emboli on chest CT. Abdominal CT showed an enlarged spleen, measuring 22 cm, with infarction. She was started on IV vanc but the left the hospital AMA. She was apparently readmitted but left AMA again. She presented to [facility] yesterday with worsening pain and anemia with a hgb of 6.0. UDS was positive for cocaine. Cardiology has been consulted for recommendations. TTE has been ordered by primary team to assess for interval change in aortic valve vegetation.    She is currently resting comfortably. She denies  any chest pain. No dyspnea, fevers or chills. NSR on telemetry. HR in the 70s. She is afebrile.   Home Medications  Prior to Admission medications   Medication Sig Start Date End Date Taking? Authorizing Provider  acetaminophen (TYLENOL) 500 MG tablet Take 1,000 mg by mouth every 6 (six) hours as needed for pain.   Yes Historical Provider, MD  amoxicillin (AMOXIL) 500 MG capsule take 1 capsule by mouth twice a day for 7 days 11/21/15  Yes Historical Provider, MD  furosemide (LASIX) 20 MG tablet Take 20 mg by mouth daily. 11/21/15  Yes Historical Provider, MD  ibuprofen (ADVIL,MOTRIN) 200 MG tablet Take 800 mg by mouth every 6 (six) hours as needed for moderate pain.   Yes Historical Provider, MD    Family History  History reviewed. No pertinent family history. - negative for CAD  Social History  Social History   Social History  . Marital Status: Single    Spouse Name: N/A  . Number of Children: N/A  . Years of Education: N/A   Occupational History  . Not on file.   Social History Main Topics  . Smoking status: Current Every Day Smoker -- 1.00 packs/day    Types: Cigarettes  . Smokeless tobacco: Not on file  . Alcohol Use: No  . Drug Use: Yes    Special: IV, Oxycodone     Comment: opiods   . Sexual Activity: Not on file   Other Topics Concern  . Not on file   Social History Narrative     Review of Systems General:  No chills, fever,  night sweats or weight changes.  Cardiovascular:  No chest pain, dyspnea on exertion, edema, orthopnea, palpitations, paroxysmal nocturnal dyspnea. Dermatological: No rash, lesions/masses Respiratory: No cough, dyspnea Urologic: No hematuria, dysuria Abdominal:   No nausea, vomiting, diarrhea, bright red blood per rectum, melena, or hematemesis Neurologic:  No visual changes, wkns, changes in mental status. All other systems reviewed and are otherwise negative except as noted above.  Physical Exam  Blood pressure 141/115, pulse 79,  temperature 98.9 F (37.2 C), temperature source Oral, resp. rate 19, height 5\' 2"  (1.575 m), weight 121 lb 7.6 oz (55.1 kg), last menstrual period 11/11/2015, SpO2 97 %.  General: sleeping but responds to questions, NAD, thin appearing  Psych: Normal affect. Neuro: Alert and oriented X 3. Moves all extremities spontaneously. HEENT: Normal  Neck: Supple without bruits or JVD. Lungs:  Resp regular and unlabored, CTA. Heart: RRR no s3, s4, or murmurs. Abdomen: Soft, non-tender, non-distended, BS + x 4.  Extremities: No clubbing, cyanosis or edema. DP/PT/Radials 2+ and equal bilaterally.  Labs  Troponin (Point of Care Test) No results for input(s): TROPIPOC in the last 72 hours.  Recent Labs  11/27/15 2248  CKTOTAL 15*   Lab Results  Component Value Date   WBC 2.6* 11/27/2015   HGB 6.8* 11/27/2015   HCT 20.0* 11/27/2015   MCV 77.9* 11/27/2015   PLT 47* 11/27/2015    Recent Labs Lab 11/27/15 1644 11/27/15 1712  NA 139 141  K 3.7 3.5  CL 102 103  CO2 27  --   BUN 16 17  CREATININE 1.51* 1.50*  CALCIUM 8.4*  --   PROT 7.0  --   BILITOT 0.4  --   ALKPHOS 87  --   ALT 7*  --   AST 17  --   GLUCOSE 104* 108*   No results found for: CHOL, HDL, LDLCALC, TRIG No results found for: DDIMER   Radiology/Studies  Dg Chest 2 View  11/27/2015  CLINICAL DATA:  Subacute bacterial endocarditis. EXAM: CHEST  2 VIEW COMPARISON:  November 20, 2015 FINDINGS: The heart size and mediastinal contours are stable. The heart size is enlarged. There is atelectasis of bilateral lung bases. Small left pleural effusion is identified. There is no pulmonary edema or focal pneumonia. The visualized skeletal structures are unremarkable. IMPRESSION: Cardiomegaly. Mild atelectasis of bilateral lung bases. Small left pleural effusion. Electronically Signed   By: Abelardo Diesel M.D.   On: 11/27/2015 19:29   Ct Abdomen Pelvis W Contrast  11/27/2015  CLINICAL DATA:  Acute onset of MRSA endocarditis.  Splenomegaly. Initial encounter. EXAM: CT ABDOMEN AND PELVIS WITH CONTRAST TECHNIQUE: Multidetector CT imaging of the abdomen and pelvis was performed using the standard protocol following bolus administration of intravenous contrast. CONTRAST:  34mL OMNIPAQUE IOHEXOL 300 MG/ML  SOLN COMPARISON:  CT of the abdomen and pelvis from 10/30/2015 FINDINGS: Trace right and small left pleural effusions are noted, with mild left basilar opacity, likely reflecting atelectasis. A small pericardial effusion is identified. Trace ascites is noted. There is persistent marked enlargement of the spleen, though it appears somewhat decreased in size from the prior study, measuring 20.4 cm in length. The previous noted hypodensity within the spleen corresponds to a region of evolving infarct. The spleen is unremarkable in appearance. The gallbladder is grossly unremarkable. The pancreas and adrenal glands are grossly unremarkable. The kidneys are unremarkable in appearance. No significant perinephric stranding is appreciated. There is no evidence of hydronephrosis. No renal or ureteral stones are identified.  The stomach is largely decompressed and grossly unremarkable, though difficult to fully assess. No acute vascular abnormalities are seen. The appendix is grossly remarkable in appearance, though difficult to fully characterize. Mild apparent wall thickening is suggested along the ascending colon, which may reflect a mild infectious or inflammatory process. A small amount of free fluid is seen within the pelvis. The bladder is mildly distended and grossly unremarkable. The uterus is grossly unremarkable in appearance, with an intrauterine device noted in expected position at the fundus of the uterus. The ovaries are relatively symmetric. No suspicious adnexal masses are seen. Mild diffuse soft tissue edema is noted along the abdominal and pelvic wall, compatible with mild anasarca. No acute osseous abnormalities are identified.  IMPRESSION: 1. Persistent marked enlargement of the spleen, though it appears mildly decreased in size from the prior study, measuring 20.4 cm in length. Region of evolving infarct noted within the spleen, relatively small compared to the size of the spleen. 2. Mild apparent wall thickening suggested along the ascending colon, which may reflect a mild infectious or inflammatory process. 3. Trace right and small left pleural effusions, with mild left basilar opacity, likely reflecting atelectasis. 4. Trace abdominopelvic ascites noted. 5. Small pericardial effusion seen. 6. Mild diffuse soft tissue edema along the abdominal and pelvic wall, compatible with mild anasarca. Electronically Signed   By: Garald Balding M.D.   On: 11/27/2015 21:47    ECG  NSR. TWI in leads V1-V3  Echocardiogram - TTE pending   ASSESSMENT AND PLAN  Principal Problem:   Splenic infarction Active Problems:   Intravenous drug abuse, continuous   MRSA bacteremia   Bacterial endocarditis   Pancytopenia (HCC)   AKI (acute kidney injury) ([place])   Anasarca   Cocaine use   1. Bacterial Endocarditis: 30 y/o medically noncompliant female with h/o bipolar disorder, drug use and MRSA endocarditis, first diagnosed 10/2015, with splenic infarction and septic pulmonary emboli. Patient has been noncompliant, leaving AMA multiple times thus not adequately treated with antibiotics. She is currently afebrile and hemodynamically stable. No s/s of acute CHF. No notable murmur on exam. She has been restarted on IV antibiotics. TTE pending to assess for interval change in aortic vegetation. TEE at outside hospital 10/2015 showed a 7x41mm vegetation on the aortic valve. IM to manage other subsequent medical problems (septic pulmonary emboli, splenic infarct and anemia). Nothing to add at this point. MD to follow with further recommendations.    Signed, Lyda Jester, PA-C 11/28/2015, 10:07 AM Patient seen and examined and history  reviewed. Agree with above findings and plan. 30 yo WF with history of IV drug abuse and endocarditis presents with progressive left flank pain. She was admitted with TV endocarditis in 2014 associated with septic pulmonary emboli and multiple lung abscesses. Left AMA before completing therapy. Recently admitted to [facility] with recurrent MRSA endocarditis. Multiple blood cultures positive. She had septic pulmonary emboli and splenic septic infarcts.  She had a 7x8 mm AV vegetation. She was treated with IV vanc but left AMA at least twice during a 2 week course. Started using IV Opana again. Developed worsening flank pain. Checked into a Rehab center in [place] and noted to have severe anemia so admitted here On exam her she is a young WF in NAD She appears jaundiced.  No JVD. Lungs are clear.  CV with no murmur or gallop No edema.  Massive splenomegaly with tenderness. Small nontender papules on palms of hands.   Ecg is normal.  Echo reviewed personally. Normal LV function. Moderate TR. Trace AI. No evidence of vegetation. 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 Complicated by septic pulmonary and splenic emboli. Now with marked splenomegaly. Also with hemolytic anemia.  Fortunately TTE does not show any definite vegetations and only moderate TR. No evidence of AV block or CHF. No indication for valve surgery at this point. Needs to complete 6 weeks of IV antibiotics. Will follow to assess for further complications. Repeat Echo in 1-2 weeks to follow pericardial effusion Stressed importance of completing antibiotic course and abstaining from any further IV drug use.   Camiya Vinal [place], [place] 11/28/2015 2:28 PM

## 2015-11-28 NOTE — Progress Notes (Addendum)
ANTIBIOTIC CONSULT NOTE - INITIAL  Pharmacy Consult for daptomycin Indication: Endocarditis  No Known Allergies  Patient Measurements: Height: 5\' 2"  (157.5 cm) Weight: 121 lb 7.6 oz (55.1 kg) IBW/kg (Calculated) : 50.1  Vital Signs: Temp: 98.2 F (36.8 C) (02/10 0436) Temp Source: Oral (02/10 0436) BP: 143/113 mmHg (02/10 0436) Pulse Rate: 61 (02/10 0436) Intake/Output from previous day: 02/09 0701 - 02/10 0700 In: [id number] [P.O.:240; I.V.:1003; Blood:335] Out: -  Intake/Output from this shift:    Labs:  Recent Labs  11/27/15 1644 11/27/15 1712  WBC 2.6*  --   HGB 6.0* 6.8*  PLT 47*  --   CREATININE 1.51* 1.50*   Estimated Creatinine Clearance: 44.2 mL/min (by C-G formula based on Cr of 1.5). No results for input(s): VANCOTROUGH, VANCOPEAK, VANCORANDOM, GENTTROUGH, GENTPEAK, GENTRANDOM, TOBRATROUGH, TOBRAPEAK, TOBRARND, AMIKACINPEAK, AMIKACINTROU, AMIKACIN in the last 72 hours.   Microbiology: Recent Results (from the past 720 hour(s))  Wet prep, genital     Status: Abnormal   Collection Time: 11/27/15  4:06 PM  Result Value Ref Range Status   Yeast Wet Prep HPF POC NONE SEEN NONE SEEN Final   Trich, Wet Prep NONE SEEN NONE SEEN Final   Clue Cells Wet Prep HPF POC NONE SEEN NONE SEEN Final   WBC, Wet Prep HPF POC MANY (A) NONE SEEN Final   Sperm NONE SEEN  Final  MRSA PCR Screening     Status: None   Collection Time: 11/27/15  9:37 PM  Result Value Ref Range Status   MRSA by PCR NEGATIVE NEGATIVE Final    Comment:        The GeneXpert MRSA Assay (FDA approved for NASAL specimens only), is one component of a comprehensive MRSA colonization surveillance program. It is not intended to diagnose MRSA infection nor to guide or monitor treatment for MRSA infections.     Medical History: Past Medical History  Diagnosis Date  . IV drug user   . MRSA (methicillin resistant Staphylococcus aureus) infection   . Abscess of skin     buttock - most recently  2009/10    Medications:  Prescriptions prior to admission  Medication Sig Dispense Refill Last Dose  . acetaminophen (TYLENOL) 500 MG tablet Take 1,000 mg by mouth every 6 (six) hours as needed for pain.   11/27/2015 at Unknown time  . amoxicillin (AMOXIL) 500 MG capsule take 1 capsule by mouth twice a day for 7 days  0 11/27/2015 at Unknown time  . furosemide (LASIX) 20 MG tablet Take 20 mg by mouth daily.  0 11/27/2015 at Unknown time  . ibuprofen (ADVIL,MOTRIN) 200 MG tablet Take 800 mg by mouth every 6 (six) hours as needed for moderate pain.   11/26/2015 at Unknown time   Scheduled:  . DAPTOmycin (CUBICIN)  IV  340 mg Intravenous Q24H  . sodium chloride flush  3 mL Intravenous Q12H    Assessment: 30 yo fever with MRSA and recent TTE and TEEs (at [facility]) showing a vegetation on the aortic valve. Pharmacy has been consulted to begin daptomycin. She is also noted with massive splenomegaly (with infarction versus abscess), pancytopenia,  AKI and septic pulmonary emboli. She has a history of leaving the hospital AMA.  -WBC= 2.6, SCr= 1.5, CrCl ~ 45  2/10 daptomycin  2/9 MRSA PCR- neg 2/9 blood x2  Plan:  -Daptomycin 340mg  IV q24hr (~ 6mg /kg/day) -Weekly CPK -Will follow renal function, cultures and clinical progress  Hildred Laser, Pharm D 11/28/2015 7:58 AM  Addendum -IDSA guideline recommend > 8mg /kg of daptomycin -Todays dose has already been made by pharmacy  Plan -Adjust daptomycin to 500mg  IV q24hr (~ 9mg /kg/day) -New dose will start 2/11  Hildred Laser, Pharm D 11/28/2015 8:34 AM

## 2015-11-28 NOTE — Progress Notes (Signed)
  Echocardiogram 2D Echocardiogram has been performed.  Molly [name] 11/28/2015, 10:28 AM

## 2015-11-29 DIAGNOSIS — D598 Other acquired hemolytic anemias: Secondary | ICD-10-CM

## 2015-11-29 DIAGNOSIS — I358 Other nonrheumatic aortic valve disorders: Secondary | ICD-10-CM

## 2015-11-29 DIAGNOSIS — F1410 Cocaine abuse, uncomplicated: Secondary | ICD-10-CM

## 2015-11-29 LAB — TYPE AND SCREEN
ABO/RH(D): O NEG
Antibody Screen: NEGATIVE
Unit division: 0
Unit division: 0

## 2015-11-29 LAB — BASIC METABOLIC PANEL
ANION GAP: 9 (ref 5–15)
BUN: 15 mg/dL (ref 6–20)
CALCIUM: 8.4 mg/dL — AB (ref 8.9–10.3)
CO2: 24 mmol/L (ref 22–32)
Chloride: 107 mmol/L (ref 101–111)
Creatinine, Ser: 1.37 mg/dL — ABNORMAL HIGH (ref 0.44–1.00)
GFR calc Af Amer: >60 mL/min (ref >60)
GFR, EST NON AFRICAN AMERICAN: 52 mL/min — AB (ref >60)
GLUCOSE: 92 mg/dL (ref 65–99)
POTASSIUM: 4.4 mmol/L (ref 3.5–5.1)
SODIUM: 140 mmol/L (ref 135–145)

## 2015-11-29 LAB — CBC WITH DIFFERENTIAL/PLATELET
Basophils Absolute: 0.1 10*3/uL (ref 0.0–0.1)
Basophils Relative: 4 %
EOS ABS: 0 10*3/uL (ref 0.0–0.7)
Eosinophils Relative: 0 %
HCT: 26.4 % — ABNORMAL LOW (ref 36.0–46.0)
Hemoglobin: 8 g/dL — ABNORMAL LOW (ref 12.0–15.0)
LYMPHS ABS: 1 10*3/uL (ref 0.7–4.0)
Lymphocytes Relative: 28 %
MCH: 23.9 pg — AB (ref 26.0–34.0)
MCHC: 30.3 g/dL (ref 30.0–36.0)
MCV: 78.8 fL (ref 78.0–100.0)
MONO ABS: 0.2 10*3/uL (ref 0.1–1.0)
Monocytes Relative: 6 %
NEUTROS PCT: 62 %
Neutro Abs: 2.4 10*3/uL (ref 1.7–7.7)
PLATELETS: 196 10*3/uL (ref 150–400)
RBC: 3.35 MIL/uL — AB (ref 3.87–5.11)
RDW: 19.3 % — AB (ref 11.5–15.5)
WBC: 3.7 10*3/uL — AB (ref 4.0–10.5)

## 2015-11-29 LAB — C4 COMPLEMENT: COMPLEMENT C4, BODY FLUID: 15 mg/dL (ref 14–44)

## 2015-11-29 LAB — HAPTOGLOBIN: Haptoglobin: 204 mg/dL — ABNORMAL HIGH (ref 34–200)

## 2015-11-29 LAB — C3 COMPLEMENT: C3 Complement: 96 mg/dL (ref 82–167)

## 2015-11-29 MED ORDER — OXYCODONE HCL ER 10 MG PO T12A
10.0000 mg | EXTENDED_RELEASE_TABLET | Freq: Two times a day (BID) | ORAL | Status: DC
  Administered 2015-11-29 – 2015-11-30 (×3): 10 mg via ORAL
  Filled 2015-11-29 (×3): qty 1

## 2015-11-29 NOTE — Progress Notes (Signed)
Patient ID: Molly [name], female   DOB: 1985-10-21, 30 y.o.   MRN: [id number]   SUBJECTIVE: Abdominal pain present but improved.    Echo: EF 50-55%, no vegetation visualized on AoV or TV, moderate TR, moderate pericardial effusion.   Scheduled Meds: . DAPTOmycin (CUBICIN)  IV  500 mg Intravenous Q24H  . Influenza vac split quadrivalent PF  0.5 mL Intramuscular Tomorrow-1000  . oxyCODONE  10 mg Oral Q12H  . senna-docusate  1 tablet Oral BID  . sodium chloride flush  3 mL Intravenous Q12H   Continuous Infusions:  PRN Meds:.acetaminophen, hydrOXYzine, [COMPLETED] oxyCODONE **FOLLOWED BY** oxyCODONE    Filed Vitals:   11/28/15 2320 11/29/15 0411 11/29/15 0500 11/29/15 0821  BP: 149/110 168/111  142/11  Pulse:  70  78  Temp:  98.7 F (37.1 C)  98.4 F (36.9 C)  TempSrc:  Oral  Oral  Resp: 19   18  Height:      Weight:   118 lb 13.3 oz (53.9 kg)   SpO2:    95%    Intake/Output Summary (Last 24 hours) at 11/29/15 0944 Last data filed at 11/28/15 1800  Gross per 24 hour  Intake    550 ml  Output      0 ml  Net    550 ml    LABS: Basic Metabolic Panel:  Recent Labs  11/28/15 0831 11/29/15 0234  NA 140 140  K 3.8 4.4  CL 107 107  CO2 24 24  GLUCOSE 92 92  BUN 14 15  CREATININE 1.46* 1.37*  CALCIUM 8.2* 8.4*   Liver Function Tests:  Recent Labs  11/27/15 1644 11/28/15 0831  AST 17 15  ALT 7* 6*  ALKPHOS 87 72  BILITOT 0.4 0.3  PROT 7.0 6.9  ALBUMIN 2.4* 2.3*   No results for input(s): LIPASE, AMYLASE in the last 72 hours. CBC:  Recent Labs  11/28/15 0831 11/28/15 1858 11/29/15 0234  WBC 3.6*  --  3.7*  NEUTROABS 2.3  --  2.4  HGB 6.6* 7.8* 8.0*  HCT 21.8* 24.9* 26.4*  MCV 79.3  --  78.8  PLT 208  --  196   Cardiac Enzymes:  Recent Labs  11/27/15 2248  CKTOTAL 15*   BNP: Invalid input(s): POCBNP D-Dimer: No results for input(s): DDIMER in the last 72 hours. Hemoglobin A1C: No results for input(s): HGBA1C in the last 72  hours. Fasting Lipid Panel: No results for input(s): CHOL, HDL, LDLCALC, TRIG, CHOLHDL, LDLDIRECT in the last 72 hours. Thyroid Function Tests: No results for input(s): TSH, T4TOTAL, T3FREE, THYROIDAB in the last 72 hours.  Invalid input(s): FREET3 Anemia Panel:  Recent Labs  11/27/15 2247  RETICCTPCT 3.2*    RADIOLOGY: Dg Chest 2 View  11/27/2015  CLINICAL DATA:  Subacute bacterial endocarditis. EXAM: CHEST  2 VIEW COMPARISON:  November 20, 2015 FINDINGS: The heart size and mediastinal contours are stable. The heart size is enlarged. There is atelectasis of bilateral lung bases. Small left pleural effusion is identified. There is no pulmonary edema or focal pneumonia. The visualized skeletal structures are unremarkable. IMPRESSION: Cardiomegaly. Mild atelectasis of bilateral lung bases. Small left pleural effusion. Electronically Signed   By: Abelardo Diesel M.D.   On: 11/27/2015 19:29   Ct Abdomen Pelvis W Contrast  11/27/2015  CLINICAL DATA:  Acute onset of MRSA endocarditis. Splenomegaly. Initial encounter. EXAM: CT ABDOMEN AND PELVIS WITH CONTRAST TECHNIQUE: Multidetector CT imaging of the abdomen and pelvis was performed using the standard protocol  following bolus administration of intravenous contrast. CONTRAST:  10mL OMNIPAQUE IOHEXOL 300 MG/ML  SOLN COMPARISON:  CT of the abdomen and pelvis from 10/30/2015 FINDINGS: Trace right and small left pleural effusions are noted, with mild left basilar opacity, likely reflecting atelectasis. A small pericardial effusion is identified. Trace ascites is noted. There is persistent marked enlargement of the spleen, though it appears somewhat decreased in size from the prior study, measuring 20.4 cm in length. The previous noted hypodensity within the spleen corresponds to a region of evolving infarct. The spleen is unremarkable in appearance. The gallbladder is grossly unremarkable. The pancreas and adrenal glands are grossly unremarkable. The kidneys are  unremarkable in appearance. No significant perinephric stranding is appreciated. There is no evidence of hydronephrosis. No renal or ureteral stones are identified. The stomach is largely decompressed and grossly unremarkable, though difficult to fully assess. No acute vascular abnormalities are seen. The appendix is grossly remarkable in appearance, though difficult to fully characterize. Mild apparent wall thickening is suggested along the ascending colon, which may reflect a mild infectious or inflammatory process. A small amount of free fluid is seen within the pelvis. The bladder is mildly distended and grossly unremarkable. The uterus is grossly unremarkable in appearance, with an intrauterine device noted in expected position at the fundus of the uterus. The ovaries are relatively symmetric. No suspicious adnexal masses are seen. Mild diffuse soft tissue edema is noted along the abdominal and pelvic wall, compatible with mild anasarca. No acute osseous abnormalities are identified. IMPRESSION: 1. Persistent marked enlargement of the spleen, though it appears mildly decreased in size from the prior study, measuring 20.4 cm in length. Region of evolving infarct noted within the spleen, relatively small compared to the size of the spleen. 2. Mild apparent wall thickening suggested along the ascending colon, which may reflect a mild infectious or inflammatory process. 3. Trace right and small left pleural effusions, with mild left basilar opacity, likely reflecting atelectasis. 4. Trace abdominopelvic ascites noted. 5. Small pericardial effusion seen. 6. Mild diffuse soft tissue edema along the abdominal and pelvic wall, compatible with mild anasarca. Electronically Signed   By: Garald Balding M.D.   On: 11/27/2015 21:47    PHYSICAL EXAM General: NAD Neck: No JVD, no thyromegaly or thyroid nodule.  Lungs: Clear to auscultation bilaterally with normal respiratory effort. CV: Nondisplaced PMI.  Heart regular  S1/S2, no S3/S4, no murmur.  No peripheral edema.   Abdomen: Soft, splenomegaly noted with LUQ mild tenderness, no distention.  Neurologic: Alert and oriented x 3.  Psych: Normal affect. Extremities: No clubbing or cyanosis.   TELEMETRY: Reviewed telemetry pt in NSR  ASSESSMENT AND PLAN: 30 yo with IV drug and cocaine abuse, bipolar disorder, and MRSA aortic valve endocarditis diagnosed in 1/17 was readmitted with LUQ pain in the setting of splenomegaly and splenic infarction.  1. Endocarditis: Patient was diagnosed with aortic and tricuspid valve endocarditis in 1/17 at [facility].  She also had septic pulmonary embolism and splenic infarct.  She left AMA, was re-admitted, and left AMA again.  This time, she came to [facility] with RUQ pain likely from splenomegaly/infarction.  She has been inadequately treated for endocarditis so far. Echo yesterday did not show a definite vegetation on the AoV or TV.   - No indication for surgery.  She will need 6 weeks IV antibiotics in a controlled environment.  2. Moderate pericardial effusion: No tamponade.  Repeat echo in 1-2 weeks to follow effusion.  3. Anemia: Suspect splenic  sequestration is major player.  LDH and bilirubin have been normal.  Has had transfusion.  4. LUQ pain: likely from splenomegaly/splenic infarction.  Apparently better today.   Cardiology will follow at a distance for now, call with questions.   Loralie Champagne 11/29/2015 9:50 AM

## 2015-11-29 NOTE — Progress Notes (Signed)
Patient ID: Molly [name], female   DOB: 06/03/1986, 30 y.o.   MRN: [id number]   Subjective: This morning, she was initially distress that she had not received pain medication. She wishes she hadn't scheduled, and we explained to her that she was getting some long-acting form of pain medication every 12 hours with breakthrough medicine on board in the event that she needed it.  Objective: Vital signs in last 24 hours: Filed Vitals:   11/28/15 2319 11/28/15 2320 11/29/15 0411 11/29/15 0500  BP:  149/110 168/111   Pulse:   70   Temp: 97.4 F (36.3 C)  98.7 F (37.1 C)   TempSrc: Oral  Oral   Resp:  19    Height:      Weight:    118 lb 13.3 oz (53.9 kg)  SpO2:       Physical exam: General: young lady, sitting up in bed, appropriately conversational HEENT: no scleral icterus, extra-ocular muscles intact, improved facial edema as compared to admission Cardiac: regular rate and rhythm, no rubs, murmurs or gallops Pulm: breathing well, clear to auscultation bilaterally Abd: bowel sounds normal, soft, massive splenomegaly with tenderness to palpation, without overt ecchyomses, unchanged from yesterday Ext: warm and well perfused, with anasarca, 2+ pedal edema to the thighs, tenderness to upper thoracic vertebral processes Lymph: no cervical or supraclavicular lymphadenopathy Skin: tender red 85m papules on bilateral palmar hands, track marks on dorsal hands Neuro: responding to questions appropriately, alert and oriented 3,, moving all extremities well  Lab Results: Basic Metabolic Panel:  Recent Labs Lab 11/28/15 0831 11/29/15 0234  NA 140 140  K 3.8 4.4  CL 107 107  CO2 24 24  GLUCOSE 92 92  BUN 14 15  CREATININE 1.46* 1.37*  CALCIUM 8.2* 8.4*   Liver Function Tests:  Recent Labs Lab 11/27/15 1644 11/28/15 0831  AST 17 15  ALT 7* 6*  ALKPHOS 87 72  BILITOT 0.4 0.3  PROT 7.0 6.9  ALBUMIN 2.4* 2.3*   CBC:  Recent Labs Lab 11/28/15 0831 11/28/15 1858  11/29/15 0234  WBC 3.6*  --  3.7*  NEUTROABS 2.3  --  2.4  HGB 6.6* 7.8* 8.0*  HCT 21.8* 24.9* 26.4*  MCV 79.3  --  78.8  PLT 208  --  196   Microbiology: Blood cultures 2/9: No growth to date Blood cultures 2/10: No growth to date  Studies/Results: Dg Chest 2 View  11/27/2015  CLINICAL DATA:  Subacute bacterial endocarditis. EXAM: CHEST  2 VIEW COMPARISON:  November 20, 2015 FINDINGS: The heart size and mediastinal contours are stable. The heart size is enlarged. There is atelectasis of bilateral lung bases. Small left pleural effusion is identified. There is no pulmonary edema or focal pneumonia. The visualized skeletal structures are unremarkable. IMPRESSION: Cardiomegaly. Mild atelectasis of bilateral lung bases. Small left pleural effusion. Electronically Signed   By: WAbelardo DieselM.D.   On: 11/27/2015 19:29   Ct Abdomen Pelvis W Contrast  11/27/2015  CLINICAL DATA:  Acute onset of MRSA endocarditis. Splenomegaly. Initial encounter. EXAM: CT ABDOMEN AND PELVIS WITH CONTRAST TECHNIQUE: Multidetector CT imaging of the abdomen and pelvis was performed using the standard protocol following bolus administration of intravenous contrast. CONTRAST:  862mOMNIPAQUE IOHEXOL 300 MG/ML  SOLN COMPARISON:  CT of the abdomen and pelvis from 10/30/2015 FINDINGS: Trace right and small left pleural effusions are noted, with mild left basilar opacity, likely reflecting atelectasis. A small pericardial effusion is identified. Trace ascites is noted. There is  persistent marked enlargement of the spleen, though it appears somewhat decreased in size from the prior study, measuring 20.4 cm in length. The previous noted hypodensity within the spleen corresponds to a region of evolving infarct. The spleen is unremarkable in appearance. The gallbladder is grossly unremarkable. The pancreas and adrenal glands are grossly unremarkable. The kidneys are unremarkable in appearance. No significant perinephric stranding is  appreciated. There is no evidence of hydronephrosis. No renal or ureteral stones are identified. The stomach is largely decompressed and grossly unremarkable, though difficult to fully assess. No acute vascular abnormalities are seen. The appendix is grossly remarkable in appearance, though difficult to fully characterize. Mild apparent wall thickening is suggested along the ascending colon, which may reflect a mild infectious or inflammatory process. A small amount of free fluid is seen within the pelvis. The bladder is mildly distended and grossly unremarkable. The uterus is grossly unremarkable in appearance, with an intrauterine device noted in expected position at the fundus of the uterus. The ovaries are relatively symmetric. No suspicious adnexal masses are seen. Mild diffuse soft tissue edema is noted along the abdominal and pelvic wall, compatible with mild anasarca. No acute osseous abnormalities are identified. IMPRESSION: 1. Persistent marked enlargement of the spleen, though it appears mildly decreased in size from the prior study, measuring 20.4 cm in length. Region of evolving infarct noted within the spleen, relatively small compared to the size of the spleen. 2. Mild apparent wall thickening suggested along the ascending colon, which may reflect a mild infectious or inflammatory process. 3. Trace right and small left pleural effusions, with mild left basilar opacity, likely reflecting atelectasis. 4. Trace abdominopelvic ascites noted. 5. Small pericardial effusion seen. 6. Mild diffuse soft tissue edema along the abdominal and pelvic wall, compatible with mild anasarca. Electronically Signed   By: Garald Balding M.D.   On: 11/27/2015 21:47   Medications: I have reviewed the patient's current medications. Scheduled Meds: . DAPTOmycin (CUBICIN)  IV  500 mg Intravenous Q24H  . Influenza vac split quadrivalent PF  0.5 mL Intramuscular Tomorrow-1000  . oxyCODONE  15 mg Oral Q12H  . senna-docusate   1 tablet Oral BID  . sodium chloride flush  3 mL Intravenous Q12H   Continuous Infusions:  PRN Meds:.acetaminophen, hydrOXYzine, [COMPLETED] oxyCODONE **FOLLOWED BY** oxyCODONE     Assessment/Plan:  Massive splenomegaly with infarction: Her chief complaint is left lower quadrant abdominal pain. We consulted general surgery who doesn't feel she's a candidate for splenectomy. I think her enlarged spleen explains her thrombocytopenia and anemia. We'll continue to manage her pain the best we can to prevent her from leaving, so she can get the full course of IV antibiotics. It appears she only received 2 breakthrough doses yesterday, and we encouraged her to ask for this medication so that we may appropriately adjust her breakthrough medication. 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 Hemodynamically stable since admission. We're also checking blood cultures to ensure she doesn't have a new organism in addition to MRSA. Given her thrombocytopenia and renal dysfunction, we'll start her on daptomycin. Her initial CK level was normal; we'll draw weekly CKs to look for daptomycin-induced myositis. I think it would be best if she stays in the hospital or  perhaps CIR for the length of her treatment to ensure she is adequately treated. 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 Haptoglobin and LDH reassuring for no intrinsic hemolytic anemia. Received PRBC 2 since admission. Hemoglobin improve  this morning to 8. -Check daily CBC  Acute kidney injury from suspected glomerulonephritis: She has hemoglobinuria and anasarca; I'm wondering if this is glomerulonephritis from her endocarditis the C3, C4 reassuring. ESR elevated the suspect it is in the setting of systemic inflammation. -Follow up spot protein-creatinine ratio [never collected since admission]  Septic pulmonary emboli: As noted on CT imaging at her hospitalization last month. No signs of respiratory status stress which is reassuring.  Bipolar disorder: I don't think she's manic nor depressed. Her affect is normal but her history is very concerning that she may want to leave AMA this admission. Suspect some amount of splitting which is confounding her report of pain and will need to be addressed thoughtful each day so that we can work collaboratively towards therapeutic goals. -Social work consulted  Dispo: Disposition is deferred at this time, awaiting improvement of current medical problems. She may stay here for the duration of her antibiotic therapy.  The patient does not have a current PCP (No Pcp Per Patient) and does need an [facility] hospital follow-up appointment after discharge.  The patient does have transportation limitations that hinder transportation to clinic appointments.  .Services Needed at time of discharge: Y = Yes, Blank = No PT:   OT:   RN:   Equipment:   Other:     LOS: 2 days   Riccardo Dubin, MD 11/29/2015, 7:02 AM

## 2015-11-29 NOTE — Progress Notes (Signed)
With the patient's permission, I called the number she provided for her mother. Upon answering the phone I recognize it to be the voice of a female identified himself as the father of Daisia Tunnicliff. He inquired further information about her whereabouts and the nature of her care, and I declined to provide such information without the permission of the patient.  Upon speaking with the patient, she confirms she would not like her father to know other details of her care as he is an alcoholic. She would only like to speak with her mother who is currently at work.  I relayed this information to the nursing staff on 6N who will be accepting this patient from [place]. We'll defer discussions whether to make her chart confidential once her mother arrives later today. In the event her father were to arrive on the floor, security would need to be called to escort him out.

## 2015-11-30 LAB — BASIC METABOLIC PANEL
ANION GAP: 8 (ref 5–15)
BUN: 15 mg/dL (ref 6–20)
CALCIUM: 8.4 mg/dL — AB (ref 8.9–10.3)
CO2: 23 mmol/L (ref 22–32)
Chloride: 107 mmol/L (ref 101–111)
Creatinine, Ser: 1.34 mg/dL — ABNORMAL HIGH (ref 0.44–1.00)
GFR calc Af Amer: >60 mL/min (ref >60)
GFR, EST NON AFRICAN AMERICAN: 53 mL/min — AB (ref >60)
Glucose, Bld: 101 mg/dL — ABNORMAL HIGH (ref 65–99)
POTASSIUM: 4.1 mmol/L (ref 3.5–5.1)
SODIUM: 138 mmol/L (ref 135–145)

## 2015-11-30 LAB — PROTEIN / CREATININE RATIO, URINE
CREATININE, URINE: 39.13 mg/dL
Protein Creatinine Ratio: 1.61 mg/mg{Cre} — ABNORMAL HIGH (ref 0.00–0.15)
Total Protein, Urine: 63 mg/dL

## 2015-11-30 LAB — CBC WITH DIFFERENTIAL/PLATELET
BASOS ABS: 0.1 10*3/uL (ref 0.0–0.1)
Basophils Relative: 1 %
EOS ABS: 0 10*3/uL (ref 0.0–0.7)
EOS PCT: 0 %
HCT: 25.9 % — ABNORMAL LOW (ref 36.0–46.0)
Hemoglobin: 8.4 g/dL — ABNORMAL LOW (ref 12.0–15.0)
Lymphocytes Relative: 25 %
Lymphs Abs: 1.1 10*3/uL (ref 0.7–4.0)
MCH: 25.8 pg — ABNORMAL LOW (ref 26.0–34.0)
MCHC: 32.4 g/dL (ref 30.0–36.0)
MCV: 79.4 fL (ref 78.0–100.0)
MONO ABS: 0.3 10*3/uL (ref 0.1–1.0)
Monocytes Relative: 8 %
Neutro Abs: 2.9 10*3/uL (ref 1.7–7.7)
Neutrophils Relative %: 66 %
PLATELETS: 198 10*3/uL (ref 150–400)
RBC: 3.26 MIL/uL — AB (ref 3.87–5.11)
RDW: 19.7 % — AB (ref 11.5–15.5)
WBC: 4.4 10*3/uL (ref 4.0–10.5)

## 2015-11-30 MED ORDER — NICOTINE 7 MG/24HR TD PT24
7.0000 mg | MEDICATED_PATCH | Freq: Every day | TRANSDERMAL | Status: DC
  Administered 2015-11-30: 7 mg via TRANSDERMAL
  Filled 2015-11-30: qty 1

## 2015-11-30 MED ORDER — AMLODIPINE BESYLATE 5 MG PO TABS
5.0000 mg | ORAL_TABLET | Freq: Every day | ORAL | Status: DC
  Administered 2015-11-30: 5 mg via ORAL
  Filled 2015-11-30: qty 1

## 2015-11-30 NOTE — Progress Notes (Signed)
  Paged by RN at 6:39 PM, saying that patient was no longer in her room and all her belongings are gone. RN said that she was sitting outside her room and she did not see anyone leaving.  However, RN last saw pt at 5:20 PM and had given her norvasc.   I asked RN to document in the chart that patient is not in the room. If the patient does come back by midnight , Please allow her to come back in her room   Signed Burgess Estelle, MD

## 2015-11-30 NOTE — Progress Notes (Signed)
Went in to check on pt and did not see her in the room.  Had given pt pain med at 1721 and we had talked for about 5 minutes, had placed Nicotene patch and pt had taken shower earlier. Personal items not in room.  NT checked in the lobby downstairs and did not see her.  Dr. Posey Pronto notified.

## 2015-11-30 NOTE — Progress Notes (Signed)
Called pt's mom and left a message that pt had left and to call me if she had any questions.

## 2015-11-30 NOTE — Progress Notes (Signed)
Patient has not returned to room at this time. Per Shelly RN,house coverage, we cannot hold the bed for her past 2100. Informed Dr. Benjamine Mola that patient had not returned and we can no longer hold the room.

## 2015-11-30 NOTE — Progress Notes (Signed)
[place] and notified her that the pt had gone.  We will hold her bed until 2000 and then if she has not returned, will unhold it.

## 2015-11-30 NOTE — Progress Notes (Signed)
BP has been elevated with diastolic over [id number].  Resident paged and notified, paged another team at [phone].

## 2015-11-30 NOTE — Progress Notes (Signed)
Patient ID: Molly [name], female   DOB: 10-14-1986, 30 y.o.   MRN: [id number]   Subjective: This morning, her mother was present in the room. She reported not sleeping well though feels better since admission. We answered her mother's questions regarding her daughter's prognosis and ongoing illness. We emphasized the need for her to continue to receive IV antibiotics to clear her systemic infection.  Objective: Vital signs in last 24 hours: Filed Vitals:   11/29/15 1544 11/29/15 2038 11/30/15 0403 11/30/15 0812  BP: 157/106 134/88 150/106 157/100  Pulse: 80 75 81 70  Temp: 98.8 F (37.1 C) 97.9 F (36.6 C) 100.5 F (38.1 C)   TempSrc: Oral Oral Oral   Resp: 16 19 18    Height:      Weight:      SpO2: 100% 100% 99% 100%   Physical exam: General: young Caucasian female, sitting up in bed, appropriately conversational HEENT: no scleral icterus, extra-ocular muscles intact, improvin facial edema as compared to admission Cardiac: regular rate and rhythm, no rubs, murmurs or gallops Pulm: breathing well, clear to auscultation bilaterally Abd: bowel sounds normal, soft, massive splenomegaly with tenderness to palpation, without overt ecchyomses, unchanged from yesterday Ext: warm and well perfused, with improving anasarca, 1-2+ pedal edema to the thighs, tenderness to upper thoracic vertebral processes Lymph: no cervical or supraclavicular lymphadenopathy Skin: tender red 84mm papules on bilateral palmar hands, track marks on dorsal hands Neuro: responding to questions appropriately, alert and oriented 3, moving all extremities well  Lab Results: Basic Metabolic Panel:  Recent Labs Lab 11/29/15 0234 11/30/15 0500  NA 140 138  K 4.4 4.1  CL 107 107  CO2 24 23  GLUCOSE 92 101*  BUN 15 15  CREATININE 1.37* 1.34*  CALCIUM 8.4* 8.4*   Liver Function Tests:  Recent Labs Lab 11/27/15 1644 11/28/15 0831  AST 17 15  ALT 7* 6*  ALKPHOS 87 72  BILITOT 0.4 0.3  PROT 7.0 6.9    ALBUMIN 2.4* 2.3*   CBC:  Recent Labs Lab 11/29/15 0234 11/30/15 0500  WBC 3.7* 4.4  NEUTROABS 2.4 2.9  HGB 8.0* 8.4*  HCT 26.4* 25.9*  MCV 78.8 79.4  PLT 196 198   Microbiology: Blood cultures 2/9: No growth to date Blood cultures 2/10: No growth to date Blood cultures 2/12: No growth to date  Studies/Results: No results found. Medications: I have reviewed the patient's current medications. Scheduled Meds: . DAPTOmycin (CUBICIN)  IV  500 mg Intravenous Q24H  . oxyCODONE  10 mg Oral Q12H  . senna-docusate  1 tablet Oral BID  . sodium chloride flush  3 mL Intravenous Q12H   Continuous Infusions:  PRN Meds:.acetaminophen, hydrOXYzine, [COMPLETED] oxyCODONE **FOLLOWED BY** oxyCODONE   Assessment/Plan:  Massive splenomegaly with infarction: Her chief complaint is left lower quadrant abdominal pain. We consulted general surgery who doesn't feel she's a candidate for splenectomy. I think her enlarged spleen explains her thrombocytopenia and anemia. We'll continue to manage her pain the best we can to prevent her from leaving, so she can get the full course of IV antibiotics. In addition to her basal regimen, she required 5/8 doses of her breakthrough medication in the last 24 hours though I wonder how much of her pain is confounded by her anxiety as she did report feeling bored and not having something to preoccupy her during her prolonged hospital stay. Her doses are parsed by 4-6 hours at a time.  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 Hemodynamically stable since admission the elevated diastolic blood pressures noted yesterday and this morning. She will require 6 month course of IV  daptomycin. 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 Haptoglobin and LDH reassuring for no intrinsic hemolytic anemia. Received PRBC 2 since admission. Hemoglobin stable at 8 morning. -Check daily CBC  Acute kidney injury: Creatinine improving to 1.3 from 1.5 on admission and as high as 2-3 last month. Anasarca continues to improve without diuresis. C3, C4 reassuring for no ongoing glomerulonephritis.  -Follow up spot protein-creatinine ratio [never collected since admission]  Septic pulmonary emboli: As noted on CT imaging at her hospitalization last month. No signs of respiratory distress which is reassuring.  Bipolar disorder: I don't think she's manic nor depressed. Her affect is normal but her history is very concerning that she may want to leave AMA this admission. Suspected some splitting yesterday though amicable affect this morning in the presence of his mother. -Social work consulted -Continue positive reinforcement  Dispo: Disposition is deferred at this time, awaiting improvement of current medical problems. She may stay here for the duration of her antibiotic therapy.  The patient does not have a current PCP (No Pcp Per Patient) and does need an [facility] hospital follow-up appointment after discharge.  The patient does have transportation limitations that hinder transportation to clinic appointments.  .Services Needed at time of discharge: Y = Yes, Blank = No PT:   OT:   RN:   Equipment:   Other:     LOS: 3 days   Riccardo Dubin, MD 11/30/2015, 10:22 AM

## 2015-12-01 NOTE — Discharge Summary (Signed)
Name: Molly [name] MRN: [id number] DOB: 13-Oct-1986 30 y.o. PCP: No Pcp Per Patient  Date of Admission: 11/27/2015  3:30 PM Date of Discharge: 11/30/2015 - she left AMA Attending Physician: No att. providers found  The patient left [place] on 11/30/2015  Discharge Diagnosis: 1. Splenomegaly with splenic infarction 2. Partially-treated MRSA aortic valve endocarditis 3. Anemia 4. Septic pulmonary emboli 5. Acute kidney injury  Discharge Medications:   Medication List    Notice    You have not been prescribed any medications.      Disposition and follow-up:   Molly [name] left AGAINST MEDICAL ADVICE.  At the hospital follow up visit please address:  1.  MRSA valve endocarditis - she will ultimately need 6 weeks of IV antibiotics whenever she gets readmitted  Consultations:  Treatment Team:  Rounding [facility], MD  Procedures Performed:  Dg Chest 2 View  11/27/2015  CLINICAL DATA:  Subacute bacterial endocarditis. EXAM: CHEST  2 VIEW COMPARISON:  November 20, 2015 FINDINGS: The heart size and mediastinal contours are stable. The heart size is enlarged. There is atelectasis of bilateral lung bases. Small left pleural effusion is identified. There is no pulmonary edema or focal pneumonia. The visualized skeletal structures are unremarkable. IMPRESSION: Cardiomegaly. Mild atelectasis of bilateral lung bases. Small left pleural effusion. Electronically Signed   By: Molly [name] M.D.   On: 11/27/2015 19:29   Ct Abdomen Pelvis W Contrast  11/27/2015  CLINICAL DATA:  Acute onset of MRSA endocarditis. Splenomegaly. Initial encounter. EXAM: CT ABDOMEN AND PELVIS WITH CONTRAST TECHNIQUE: Multidetector CT imaging of the abdomen and pelvis was performed using the standard protocol following bolus administration of intravenous contrast. CONTRAST:  70mL OMNIPAQUE IOHEXOL 300 MG/ML  SOLN COMPARISON:  CT of the abdomen and pelvis from 10/30/2015 FINDINGS: Trace right and  small left pleural effusions are noted, with mild left basilar opacity, likely reflecting atelectasis. A small pericardial effusion is identified. Trace ascites is noted. There is persistent marked enlargement of the spleen, though it appears somewhat decreased in size from the prior study, measuring 20.4 cm in length. The previous noted hypodensity within the spleen corresponds to a region of evolving infarct. The spleen is unremarkable in appearance. The gallbladder is grossly unremarkable. The pancreas and adrenal glands are grossly unremarkable. The kidneys are unremarkable in appearance. No significant perinephric stranding is appreciated. There is no evidence of hydronephrosis. No renal or ureteral stones are identified. The stomach is largely decompressed and grossly unremarkable, though difficult to fully assess. No acute vascular abnormalities are seen. The appendix is grossly remarkable in appearance, though difficult to fully characterize. Mild apparent wall thickening is suggested along the ascending colon, which may reflect a mild infectious or inflammatory process. A small amount of free fluid is seen within the pelvis. The bladder is mildly distended and grossly unremarkable. The uterus is grossly unremarkable in appearance, with an intrauterine device noted in expected position at the fundus of the uterus. The ovaries are relatively symmetric. No suspicious adnexal masses are seen. Mild diffuse soft tissue edema is noted along the abdominal and pelvic wall, compatible with mild anasarca. No acute osseous abnormalities are identified. IMPRESSION: 1. Persistent marked enlargement of the spleen, though it appears mildly decreased in size from the prior study, measuring 20.4 cm in length. Region of evolving infarct noted within the spleen, relatively small compared to the size of the spleen. 2. Mild apparent wall thickening suggested along the ascending colon, which may reflect a mild infectious or  inflammatory process. 3. Trace right and small left pleural effusions, with mild left basilar opacity, likely reflecting atelectasis. 4. Trace abdominopelvic ascites noted. 5. Small pericardial effusion seen. 6. Mild diffuse soft tissue edema along the abdominal and pelvic wall, compatible with mild anasarca. Electronically Signed   By: Molly [name] M.D.   On: 11/27/2015 21:47   Admission HPI:   Molly [name] is a 30 year old lady with bipolar disorder and MRSA endocarditis, splenic infarction, and septic pulmonary emboli presenting with left lower quadrant abdominal pain.  In early January, she started spiking fevers so she went to [facility]. During that admission, multiple blood cultures grew MRSA, TTE and TEEs showed a 7x73mm vegetation on the aortic valve, CT chest showed numerous septic pulmonary emboli, CT abdomen showed a massively enlarged spleen of 22cm with infarction, and MRI did not show discitis. Over the course of two weeks, she left against medical advice at least twice; it appears he received about one week total of IV vancomycin during that time frame. On January 18, she left AMA for good, and started using IV Opana again. During that time, she's been having progressively worsening left lower quadrant abdominal and left flank pain. She's had a few fevers but denies any worsening lower extremity edema, orthopnea, chest pain, loss of vision, or focal numbness or weakness. Review of systems was otherwise non-revealing. Two days ago, she admitted herself to a rehabilitation program in [place]. A doctor there checked a CBC this morning and noted her to be severely anemic, so he referred her to [facility] emergency department.  Hospital Course by problem list:   1. Splenomegaly with splenic infarction: Her chief complaint on admission was left lower quadrant pain; she had a markedly enlarged spleen on exam, confirmed by a CT abdomen that showed splenomegaly with splenic  infarction. She had marked thrombocytopenia of 70 as well which we thought was most likely from splenic consumption. General surgery was consult, who did not feel she was a surgical candidate.  2. 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 She had a 5 by 7mm vegetation on the coronary cusp. She was started on IV vancomycin, but left AGAINST MEDICAL ADVICE at least 3 times during the hospitalization so she did not complete her course. She then presented to [place] with a chief complaint of abdominal pain from her splenomegaly. A transthoracic echo did not show a vegetation but we elected to start her on IV daptomycin which she tolerated well.We avoided vancomycin given her acute kidney injury, anemia, and thrombocytopenia.  Blood cultures did not grow any organisms after three days. We consulted cardiology who did not feel she was a surgical candidate. She left against medical advice with a peripheral IV.  3. Anemia: Her hemoglobin was 6.0 on admission. Her reticulocyte count was abnormally low so we suspected this was bone marrow suppression from her endocarditis. She was transfused 2 units and her anemia responded appropriately.  4. Septic pulmonary emboli: These were noted on her CT chest from her prior hospitalization. She was oxygenating well on room air while here.  5. Acute kidney injury: Her creatinine was up to 1.5 while hospitalized here. She has generalized anasarca and her protein-creatinine ratio was markedly elevated to 1.6  Discharge Vitals:   BP 159/108 mmHg  Pulse 81  Temp(Src) 98.9 F (37.2 C) (Oral)  Resp 18  Ht 5\' 2"  (1.575 m)  Wt 53.9 kg (118 lb 13.3 oz)  BMI 21.73 kg/m2  SpO2 100%  LMP 11/11/2015  Discharge Labs:  Results for orders placed or performed during the hospital encounter of 11/27/15 (from  the past 24 hour(s))  BLOOD TRANSFUSION REPORT - SCANNED     Status: None   Collection Time: 12/01/15 10:29 AM   Narrative   Ordered by an unspecified provider.    Signed: Loleta Chance, MD 12/01/2015, 11:14 AM

## 2015-12-02 LAB — CULTURE, BLOOD (ROUTINE X 2)
CULTURE: NO GROWTH
Culture: NO GROWTH

## 2015-12-03 LAB — CULTURE, BLOOD (ROUTINE X 2)
CULTURE: NO GROWTH
Culture: NO GROWTH

## 2015-12-05 LAB — CULTURE, BLOOD (ROUTINE X 2)
CULTURE: NO GROWTH
CULTURE: NO GROWTH

## 2015-12-16 ENCOUNTER — Inpatient Hospital Stay (HOSPITAL_COMMUNITY)
Admission: EM | Admit: 2015-12-16 | Discharge: 2015-12-22 | DRG: 288 | Discharge status: Against Medical Advice | Payer: Self-pay | Attending: Internal Medicine | Admitting: Internal Medicine

## 2015-12-16 ENCOUNTER — Inpatient Hospital Stay (HOSPITAL_COMMUNITY): Payer: Self-pay

## 2015-12-16 ENCOUNTER — Encounter (HOSPITAL_COMMUNITY): Payer: Self-pay | Admitting: Neurology

## 2015-12-16 DIAGNOSIS — D649 Anemia, unspecified: Secondary | ICD-10-CM

## 2015-12-16 DIAGNOSIS — I38 Endocarditis, valve unspecified: Secondary | ICD-10-CM

## 2015-12-16 DIAGNOSIS — M869 Osteomyelitis, unspecified: Secondary | ICD-10-CM | POA: Diagnosis present

## 2015-12-16 DIAGNOSIS — D735 Infarction of spleen: Secondary | ICD-10-CM | POA: Diagnosis present

## 2015-12-16 DIAGNOSIS — R7881 Bacteremia: Secondary | ICD-10-CM | POA: Diagnosis present

## 2015-12-16 DIAGNOSIS — I272 Other secondary pulmonary hypertension: Secondary | ICD-10-CM | POA: Diagnosis present

## 2015-12-16 DIAGNOSIS — B9562 Methicillin resistant Staphylococcus aureus infection as the cause of diseases classified elsewhere: Secondary | ICD-10-CM | POA: Diagnosis present

## 2015-12-16 DIAGNOSIS — R161 Splenomegaly, not elsewhere classified: Secondary | ICD-10-CM

## 2015-12-16 DIAGNOSIS — F1910 Other psychoactive substance abuse, uncomplicated: Secondary | ICD-10-CM

## 2015-12-16 DIAGNOSIS — I76 Septic arterial embolism: Secondary | ICD-10-CM | POA: Diagnosis present

## 2015-12-16 DIAGNOSIS — F17200 Nicotine dependence, unspecified, uncomplicated: Secondary | ICD-10-CM

## 2015-12-16 DIAGNOSIS — I330 Acute and subacute infective endocarditis: Principal | ICD-10-CM

## 2015-12-16 DIAGNOSIS — F1120 Opioid dependence, uncomplicated: Secondary | ICD-10-CM | POA: Diagnosis present

## 2015-12-16 DIAGNOSIS — Z4659 Encounter for fitting and adjustment of other gastrointestinal appliance and device: Secondary | ICD-10-CM | POA: Insufficient documentation

## 2015-12-16 DIAGNOSIS — M4625 Osteomyelitis of vertebra, thoracolumbar region: Secondary | ICD-10-CM | POA: Diagnosis present

## 2015-12-16 DIAGNOSIS — K5900 Constipation, unspecified: Secondary | ICD-10-CM | POA: Diagnosis present

## 2015-12-16 DIAGNOSIS — N179 Acute kidney failure, unspecified: Secondary | ICD-10-CM | POA: Diagnosis present

## 2015-12-16 DIAGNOSIS — D72819 Decreased white blood cell count, unspecified: Secondary | ICD-10-CM | POA: Diagnosis present

## 2015-12-16 DIAGNOSIS — I358 Other nonrheumatic aortic valve disorders: Secondary | ICD-10-CM | POA: Diagnosis present

## 2015-12-16 DIAGNOSIS — R310 Gross hematuria: Secondary | ICD-10-CM

## 2015-12-16 DIAGNOSIS — R1010 Upper abdominal pain, unspecified: Secondary | ICD-10-CM

## 2015-12-16 DIAGNOSIS — N181 Chronic kidney disease, stage 1: Secondary | ICD-10-CM | POA: Diagnosis present

## 2015-12-16 DIAGNOSIS — R601 Generalized edema: Secondary | ICD-10-CM

## 2015-12-16 DIAGNOSIS — I2690 Septic pulmonary embolism without acute cor pulmonale: Secondary | ICD-10-CM | POA: Diagnosis present

## 2015-12-16 LAB — RAPID URINE DRUG SCREEN, HOSP PERFORMED
AMPHETAMINES: NOT DETECTED
Barbiturates: NOT DETECTED
Benzodiazepines: NOT DETECTED
Cocaine: POSITIVE — AB
OPIATES: POSITIVE — AB
TETRAHYDROCANNABINOL: NOT DETECTED

## 2015-12-16 LAB — CBC
HEMATOCRIT: 22.8 % — AB (ref 36.0–46.0)
HEMOGLOBIN: 6.9 g/dL — AB (ref 12.0–15.0)
MCH: 24.2 pg — ABNORMAL LOW (ref 26.0–34.0)
MCHC: 30.3 g/dL (ref 30.0–36.0)
MCV: 80 fL (ref 78.0–100.0)
Platelets: 202 10*3/uL (ref 150–400)
RBC: 2.85 MIL/uL — ABNORMAL LOW (ref 3.87–5.11)
RDW: 18.4 % — AB (ref 11.5–15.5)
WBC: 2.7 10*3/uL — AB (ref 4.0–10.5)

## 2015-12-16 LAB — URINE MICROSCOPIC-ADD ON

## 2015-12-16 LAB — COMPREHENSIVE METABOLIC PANEL
ALK PHOS: 119 U/L (ref 38–126)
ALT: 13 U/L — AB (ref 14–54)
ANION GAP: 13 (ref 5–15)
AST: 18 U/L (ref 15–41)
Albumin: 3 g/dL — ABNORMAL LOW (ref 3.5–5.0)
BILIRUBIN TOTAL: 0.2 mg/dL — AB (ref 0.3–1.2)
BUN: 19 mg/dL (ref 6–20)
CALCIUM: 9.1 mg/dL (ref 8.9–10.3)
CO2: 26 mmol/L (ref 22–32)
Chloride: 101 mmol/L (ref 101–111)
Creatinine, Ser: 1.19 mg/dL — ABNORMAL HIGH (ref 0.44–1.00)
GFR calc Af Amer: >60 mL/min (ref >60)
Glucose, Bld: 117 mg/dL — ABNORMAL HIGH (ref 65–99)
POTASSIUM: 3.6 mmol/L (ref 3.5–5.1)
Sodium: 140 mmol/L (ref 135–145)
TOTAL PROTEIN: 8.2 g/dL — AB (ref 6.5–8.1)

## 2015-12-16 LAB — DIFFERENTIAL
Basophils Absolute: 0 10*3/uL (ref 0.0–0.1)
Basophils Relative: 1 %
Eosinophils Absolute: 0 10*3/uL (ref 0.0–0.7)
Eosinophils Relative: 2 %
LYMPHS PCT: 29 %
Lymphs Abs: 0.5 10*3/uL — ABNORMAL LOW (ref 0.7–4.0)
MONO ABS: 0.1 10*3/uL (ref 0.1–1.0)
Monocytes Relative: 4 %
NEUTROS ABS: 1.2 10*3/uL — AB (ref 1.7–7.7)
NEUTROS PCT: 66 %

## 2015-12-16 LAB — LIPASE, BLOOD: Lipase: 52 U/L — ABNORMAL HIGH (ref 11–51)

## 2015-12-16 LAB — URINALYSIS, ROUTINE W REFLEX MICROSCOPIC
Bilirubin Urine: NEGATIVE
Glucose, UA: NEGATIVE mg/dL
KETONES UR: NEGATIVE mg/dL
Leukocytes, UA: NEGATIVE
NITRITE: NEGATIVE
PH: 6 (ref 5.0–8.0)
Protein, ur: 100 mg/dL — AB
Specific Gravity, Urine: 1.02 (ref 1.005–1.030)

## 2015-12-16 LAB — I-STAT BETA HCG BLOOD, ED (MC, WL, AP ONLY)

## 2015-12-16 LAB — PROTIME-INR
INR: 1.14 (ref 0.00–1.49)
PROTHROMBIN TIME: 14.8 s (ref 11.6–15.2)

## 2015-12-16 LAB — APTT: aPTT: 106 seconds — ABNORMAL HIGH (ref 24–37)

## 2015-12-16 LAB — I-STAT CG4 LACTIC ACID, ED: Lactic Acid, Venous: 1 mmol/L (ref 0.5–2.0)

## 2015-12-16 MED ORDER — SODIUM CHLORIDE 0.9 % IV BOLUS (SEPSIS)
1000.0000 mL | Freq: Once | INTRAVENOUS | Status: AC
  Administered 2015-12-16: 1000 mL via INTRAVENOUS

## 2015-12-16 MED ORDER — SODIUM CHLORIDE 0.9 % IV BOLUS (SEPSIS): 2000.0000 mL | Freq: Once | INTRAVENOUS | Status: DC

## 2015-12-16 MED ORDER — IOHEXOL 300 MG/ML  SOLN
100.0000 mL | Freq: Once | INTRAMUSCULAR | Status: AC | PRN
  Administered 2015-12-16: 100 mL via INTRAVENOUS

## 2015-12-16 NOTE — ED Notes (Signed)
IV team at bedside 

## 2015-12-16 NOTE — ED Notes (Signed)
PT remains in restroom. PT reports she is trying to have a BM

## 2015-12-16 NOTE — ED Notes (Signed)
Alisia Ferrari, PA notified of patients hemoglobin

## 2015-12-16 NOTE — ED Provider Notes (Signed)
CSN: [id number]     Arrival date & time 12/16/15  1735 History   First MD Initiated Contact with Patient 12/16/15 2006     Chief Complaint  Patient presents with  . Abdominal Pain    (Consider location/radiation/quality/duration/timing/severity/associated sxs/prior Treatment) HPI Comments: Patient is a 30 y/o female with a hx of IVDU and incompletely treated MRSA endocarditis who presents to the ED for abdominal pain. Patient states that her abdominal pain has been worsening over the last few days. She states that her abdomen has become more distended. She describes a cramping, "twisting" pain which is intermittent and waxing and waning in severity. Patient states that sometimes this will wake her from sleep. She has had moderate relief of her pain with Tylenol. She states that she has not had a bowel movement in 1 week. She reports experiencing some hematuria, but states that this was present when she was last hospitalized. She has continued to notice fluid in her extremities. She feels short of breath, mostly when supine. She has needed to sleep all sleep upright to prevent worsening shortness of breath. Patient denies fever, chest pain, vomiting, and dysuria. No hx of abdominal surgeries. Patient with hx of Opana use IV; last use at 3pm today.  Disease course complicated by recent history of MRSA endocarditis. This has progressed to secondary splenic infarction and septic pulmonary emboli. Patient left AMA after admission for treatment of LUQ pain and splenomegaly on 11/30/15. Per d/c note, the patient started spiking fevers in early January. She was admitted to [facility] where "multiple blood cultures grew MRSA, TTE and TEEs showed a 7x49mm vegetation on the aortic valve, CT chest showed numerous septic pulmonary emboli, CT abdomen showed a massively enlarged spleen of 22cm with infarction, and MRI did not show discitis. Over the course of two weeks, she left against medical advice at least twice; it  appears he received about one week total of IV vancomycin during that time frame. On January 18, she left AMA for good, and started using IV Opana again". Her most recent admission on 11/27/15 was secondary to severe anemia, requiring transfusion, and subacute bacterial endocarditis. She was treated with IV Daptomycin given hx of endocarditis.  Patient is a 29 y.o. female presenting with abdominal pain. The history is provided by the patient. No language interpreter was used.  Abdominal Pain Associated symptoms: constipation, hematuria, nausea (mild) and shortness of breath (when supine)   Associated symptoms: no chest pain, no diarrhea, no dysuria, no fever and no vomiting     Past Medical History  Diagnosis Date  . IV drug user   . MRSA (methicillin resistant Staphylococcus aureus) infection   . Abscess of skin     buttock - most recently 2009/10   Past Surgical History  Procedure Laterality Date  . Tee without cardioversion N/A 07/04/2013    Procedure: TRANSESOPHAGEAL ECHOCARDIOGRAM (TEE);  Surgeon: Larey Dresser, MD;  [facility] ENDOSCOPY;  Service: Cardiovascular;  Laterality: N/A;  . Tonsillectomy      30 years old   No family history on file. Social History  Substance Use Topics  . Smoking status: Current Every Day Smoker -- 1.00 packs/day    Types: Cigarettes  . Smokeless tobacco: None  . Alcohol Use: No   OB History    No data available      Review of Systems  Constitutional: Negative for fever.  Respiratory: Positive for shortness of breath (when supine).   Cardiovascular: Negative for chest pain.  Gastrointestinal: Positive for nausea (mild), abdominal pain and constipation. Negative for vomiting and diarrhea.  Genitourinary: Positive for hematuria. Negative for dysuria.  Neurological: Negative for syncope.  All other systems reviewed and are negative.   Allergies  Review of patient's allergies indicates no known allergies.  Home Medications   Prior to  Admission medications   Not on File   BP 131/89 mmHg  Pulse 83  Temp(Src) 98.2 F (36.8 C) (Oral)  Resp 16  SpO2 98%  LMP 11/11/2015   Physical Exam  Constitutional: She is oriented to person, place, and time. She appears well-developed and well-nourished. No distress.  Mild anasarca Nontoxic appearing  HENT:  Head: Normocephalic and atraumatic.  Patient tolerating secretions without difficulty.  Eyes: Conjunctivae and EOM are normal. No scleral icterus.  Neck: Normal range of motion.  Cardiovascular: Normal rate, regular rhythm and intact distal pulses.   No murmur appreciated.  Pulmonary/Chest: Effort normal and breath sounds normal. No respiratory distress. She has no wheezes. She has no rales.  Lungs CTAB. Mild dyspnea when supine. Chest expansion symmetric.  Abdominal: Soft. She exhibits distension and ascites. Bowel sounds are decreased. There is splenomegaly. There is tenderness in the right upper quadrant and left upper quadrant. There is no rigidity and no guarding.  Soft distended abdomen with tenderness to palpation in the left upper quadrant and the right upper quadrant. No rigidity or peritoneal signs. Splenomegaly present. Negative Murphy sign. Ascites present.  Musculoskeletal: Normal range of motion. She exhibits edema.  2+ pitting edema in BLE  Neurological: She is alert and oriented to person, place, and time. She exhibits normal muscle tone. Coordination normal.  Patient moving all extremities.  Skin: Skin is warm and dry. No rash noted. She is not diaphoretic. No erythema. No pallor.  Psychiatric: She has a normal mood and affect. Her behavior is normal.  Nursing note and vitals reviewed.   ED Course  Procedures (including critical care time) Labs Review Labs Reviewed  LIPASE, BLOOD - Abnormal; Notable for the following:    Lipase 52 (*)    All other components within normal limits  COMPREHENSIVE METABOLIC PANEL - Abnormal; Notable for the following:     Glucose, Bld 117 (*)    Creatinine, Ser 1.19 (*)    Total Protein 8.2 (*)    Albumin 3.0 (*)    ALT 13 (*)    Total Bilirubin 0.2 (*)    All other components within normal limits  CBC - Abnormal; Notable for the following:    WBC 2.7 (*)    RBC 2.85 (*)    Hemoglobin 6.9 (*)    HCT 22.8 (*)    MCH 24.2 (*)    RDW 18.4 (*)    All other components within normal limits  URINALYSIS, ROUTINE W REFLEX MICROSCOPIC (NOT AT [facility]) - Abnormal; Notable for the following:    Color, Urine AMBER (*)    APPearance CLOUDY (*)    Hgb urine dipstick LARGE (*)    Protein, ur 100 (*)    All other components within normal limits  URINE MICROSCOPIC-ADD ON - Abnormal; Notable for the following:    Squamous Epithelial / LPF 0-5 (*)    Bacteria, UA MANY (*)    Casts RED CELL CAST (*)    All other components within normal limits  URINE RAPID DRUG SCREEN, HOSP PERFORMED - Abnormal; Notable for the following:    Opiates POSITIVE (*)    Cocaine POSITIVE (*)    All  other components within normal limits  APTT - Abnormal; Notable for the following:    aPTT 106 (*)    All other components within normal limits  DIFFERENTIAL - Abnormal; Notable for the following:    Neutro Abs 1.2 (*)    Lymphs Abs 0.5 (*)    All other components within normal limits  CULTURE, BLOOD (ROUTINE X 2)  CULTURE, BLOOD (ROUTINE X 2)  PROTIME-INR  I-STAT BETA HCG BLOOD, ED ([facility], [facility], [facility] ONLY)  I-STAT CG4 LACTIC ACID, ED  I-STAT BETA HCG BLOOD, ED ([facility], [facility], [facility] ONLY)  I-STAT CG4 LACTIC ACID, ED    Imaging Review No results found.   I have personally reviewed and evaluated these images and lab results as part of my medical decision-making.   EKG Interpretation None      MDM   Final diagnoses:  Splenomegaly  Pain of upper abdomen  Subacute bacterial endocarditis (HCC)  Anasarca  Polysubstance abuse  Hematuria, gross  Anemia, unspecified anemia type    Patient presents for abdominal pain. Anasarca on exam. Hx of  splenomegaly and splenic infarct as well as septic pulmonary emboli secondary to bacterial endocarditis. Patient has never completed course of abx as patient has left AMA from all previous admissions. CTAP pending to evaluate worsening abdominal pain, though I suspect this is all due to worsening ascites. No evidence of acute surgical abdomen. Case discussed with IM resident team who will admit.   Filed Vitals:   12/16/15 2000 12/16/15 2030 12/16/15 2200 12/16/15 2215  BP: 135/96 128/91  131/89  Pulse: 93 88  83  Temp:      TempSrc:      Resp:   18 16  SpO2: 99% 99%  98%     Antonietta Breach, PA-C 12/16/15 [place], DO 12/16/15 2305

## 2015-12-16 NOTE — ED Notes (Signed)
PT returns to room with tote bag and IV pole.

## 2015-12-16 NOTE — ED Notes (Signed)
CT arrives to transport PT. PT is in the bathroom. PT made aware that CT is here to transport PT. PT says, "OK" and does not leave restroom

## 2015-12-16 NOTE — ED Notes (Signed)
Hemoglobin 6.9 Critical lab notification.

## 2015-12-16 NOTE — ED Notes (Signed)
Pt here with abdominal pain and back pain. Was admitted 2 weeks ago for endocarditis but left AMA, had enlarged spleen. Since she left her abdomen was been getting more distended and painful. LBM 1 week ago.

## 2015-12-17 ENCOUNTER — Inpatient Hospital Stay (HOSPITAL_COMMUNITY): Payer: Self-pay

## 2015-12-17 ENCOUNTER — Encounter (HOSPITAL_COMMUNITY): Payer: Self-pay | Admitting: Internal Medicine

## 2015-12-17 DIAGNOSIS — K5900 Constipation, unspecified: Secondary | ICD-10-CM

## 2015-12-17 DIAGNOSIS — D72819 Decreased white blood cell count, unspecified: Secondary | ICD-10-CM

## 2015-12-17 DIAGNOSIS — D649 Anemia, unspecified: Secondary | ICD-10-CM

## 2015-12-17 DIAGNOSIS — I38 Endocarditis, valve unspecified: Secondary | ICD-10-CM | POA: Insufficient documentation

## 2015-12-17 DIAGNOSIS — D735 Infarction of spleen: Secondary | ICD-10-CM

## 2015-12-17 DIAGNOSIS — I350 Nonrheumatic aortic (valve) stenosis: Secondary | ICD-10-CM

## 2015-12-17 DIAGNOSIS — R161 Splenomegaly, not elsewhere classified: Secondary | ICD-10-CM

## 2015-12-17 DIAGNOSIS — N179 Acute kidney failure, unspecified: Secondary | ICD-10-CM

## 2015-12-17 DIAGNOSIS — F19288 Other psychoactive substance dependence with other psychoactive substance-induced disorder: Secondary | ICD-10-CM

## 2015-12-17 LAB — BASIC METABOLIC PANEL
Anion gap: 9 (ref 5–15)
BUN: 15 mg/dL (ref 6–20)
CO2: 25 mmol/L (ref 22–32)
Calcium: 8.7 mg/dL — ABNORMAL LOW (ref 8.9–10.3)
Chloride: 105 mmol/L (ref 101–111)
Creatinine, Ser: 1.1 mg/dL — ABNORMAL HIGH (ref 0.44–1.00)
GFR calc Af Amer: >60 mL/min (ref >60)
GLUCOSE: 98 mg/dL (ref 65–99)
POTASSIUM: 3.2 mmol/L — AB (ref 3.5–5.1)
Sodium: 139 mmol/L (ref 135–145)

## 2015-12-17 LAB — MRSA PCR SCREENING: MRSA BY PCR: NEGATIVE

## 2015-12-17 LAB — CK: CK TOTAL: 15 U/L — AB (ref 38–234)

## 2015-12-17 LAB — PREPARE RBC (CROSSMATCH)

## 2015-12-17 MED ORDER — POLYETHYLENE GLYCOL 3350 17 G PO PACK
17.0000 g | PACK | Freq: Two times a day (BID) | ORAL | Status: DC
  Administered 2015-12-17 – 2015-12-22 (×10): 17 g via ORAL
  Filled 2015-12-17 (×12): qty 1

## 2015-12-17 MED ORDER — DICLOFENAC SODIUM 1 % TD GEL
4.0000 g | Freq: Four times a day (QID) | TRANSDERMAL | Status: DC
  Administered 2015-12-17 – 2015-12-22 (×19): 4 g via TOPICAL
  Filled 2015-12-17: qty 100

## 2015-12-17 MED ORDER — SODIUM CHLORIDE 0.9 % IV SOLN
410.0000 mg | INTRAVENOUS | Status: DC
  Administered 2015-12-17 – 2015-12-18 (×2): 410 mg via INTRAVENOUS
  Filled 2015-12-17 (×2): qty 8.2

## 2015-12-17 MED ORDER — OXYCODONE HCL ER 10 MG PO T12A
10.0000 mg | EXTENDED_RELEASE_TABLET | Freq: Two times a day (BID) | ORAL | Status: DC
  Administered 2015-12-17 – 2015-12-18 (×3): 10 mg via ORAL
  Filled 2015-12-17 (×3): qty 1

## 2015-12-17 MED ORDER — ENOXAPARIN SODIUM 30 MG/0.3ML ~~LOC~~ SOLN
30.0000 mg | SUBCUTANEOUS | Status: DC
  Administered 2015-12-17 – 2015-12-22 (×6): 30 mg via SUBCUTANEOUS
  Filled 2015-12-17 (×6): qty 0.3

## 2015-12-17 MED ORDER — SODIUM CHLORIDE 0.9 % IV SOLN
Freq: Once | INTRAVENOUS | Status: AC
  Administered 2015-12-17: 05:00:00 via INTRAVENOUS

## 2015-12-17 MED ORDER — SENNA 8.6 MG PO TABS
2.0000 | ORAL_TABLET | Freq: Every day | ORAL | Status: DC
  Administered 2015-12-17 – 2015-12-20 (×3): 17.2 mg via ORAL
  Filled 2015-12-17 (×5): qty 2

## 2015-12-17 MED ORDER — ACETAMINOPHEN 500 MG PO TABS
1000.0000 mg | ORAL_TABLET | Freq: Once | ORAL | Status: AC
  Administered 2015-12-17: 1000 mg via ORAL
  Filled 2015-12-17: qty 2

## 2015-12-17 MED ORDER — DIPHENHYDRAMINE HCL 25 MG PO CAPS
25.0000 mg | ORAL_CAPSULE | Freq: Once | ORAL | Status: AC
  Administered 2015-12-17: 25 mg via ORAL
  Filled 2015-12-17: qty 1

## 2015-12-17 MED ORDER — OXYCODONE-ACETAMINOPHEN 5-325 MG PO TABS
1.0000 | ORAL_TABLET | Freq: Four times a day (QID) | ORAL | Status: DC | PRN
  Administered 2015-12-17 – 2015-12-18 (×2): 1 via ORAL
  Filled 2015-12-17 (×2): qty 1

## 2015-12-17 NOTE — Progress Notes (Signed)
Pt admitted to room 539 alert and oriented x 4 moe x 4 verbalis ed understanding of safety, as pt stated she was here before less than 2 weeks ago. Iv patent abd. Is distended with slight tenderness. Pt doesn't;t want any pain med now, but said that she would like tylenol later.Meds locked up in pharmacy. No skin issues noted.vss.

## 2015-12-17 NOTE — Progress Notes (Signed)
Pharmacy Antibiotic Note  Molly [name] is a 30 y.o. female admitted on 12/16/2015 with abdominal/back pain.  Pharmacy has been consulted for Daptomycin dosing.  Pt has history of MRSA endocarditis and has left AMA from multiple hospitals including [name] over the course of several months. She was on vancomycin for a while and most recently at [facility] was on Daptomycin.   CT scan from 3/1 now shows possible spinal osteomyelitis  Plan: -Daptomycin 410 mg IV q24h (~8 mg/kg) -Weekly CPK, will get baseline with AM labs -F/U length of treatment given several gaps in therapy   Height: 5\' 2"  (157.5 cm) Weight: 112 lb 6.4 oz (50.984 kg) IBW/kg (Calculated) : 50.1  Temp (24hrs), Avg:98.4 F (36.9 C), Min:98.2 F (36.8 C), Max:98.7 F (37.1 C)   Recent Labs Lab 12/16/15 1837 12/16/15 2120  WBC 2.7*  --   CREATININE 1.19*  --   LATICACIDVEN  --  1.00    Estimated Creatinine Clearance: 55.7 mL/min (by C-G formula based on Cr of 1.19).    No Known Allergies  Molly [name] 12/17/2015 3:01 AM

## 2015-12-17 NOTE — Care Management Note (Addendum)
Case Management Note  Patient Details  Name: Molly [name] MRN: [id number] Date of Birth: 20-Nov-1985  Subjective/Objective:    Admitted with endocarditis, past medical history of IV drug use, MRSA valve endocarditis, splenic infarction, and septic pulmonary emboli presenting to the hospital with a one month history of abdominal pain and back pain. Lives with mom/dad. Independent with ADL's. No DME usage.   Action/Plan: Pt will need home IV infusion(antibiotic), unsure of weeks. CM to f/u with disposition needs.  Expected Discharge Date:                  Expected Discharge Plan:  [place] (iv drug abuser, ? needing snf @ d/c for iv antibiotic  infusion)  In-House Referral:  Clinical Social Work  Discharge planning Services  CM Consult  Post Acute Care Choice:    Choice offered to:     DME Arranged:    DME Agency:     HH Arranged:    [place] Agency:     Status of Service:  In process, will continue to follow  Medicare Important Message Given:    Date Medicare IM Given:    Medicare IM give by:    Date Additional Medicare IM Given:    Additional Medicare Important Message give by:     If discussed at [place] of Stay Meetings, dates discussed:    Additional Comments: CM attempted to discuss discharge plan. Pt stated she's trying to sleep and don't feel like talking.  Whitman Hero [place], [place], Durel Salts [phone] 12/17/2015, 2:28 PM

## 2015-12-17 NOTE — Progress Notes (Signed)
  Echocardiogram 2D Echocardiogram has been performed.  Donata Clay 12/17/2015, 2:15 PM

## 2015-12-17 NOTE — H&P (Signed)
Date: 12/17/2015               Patient Name:  Molly [name] MRN: [id number]  DOB: 07-Nov-1985 Age / Sex: 30 y.o., female   PCP: No Pcp Per Patient         Medical Service: Internal Medicine Teaching Service         Attending Physician: Dr. Oval Linsey, MD    First Contact: Dr. Posey Pronto  Pager: [phone]  Second Contact: Dr. Randell Patient  Pager: [phone]       After Hours (After 5p/  First Contact Pager: [phone]  weekends / holidays): Second Contact Pager: [phone]   Chief Complaint: back pain and abdominal pain   History of Present Illness: Patient is a 30 year old female with a past medical history of IV drug use, MRSA valve endocarditis, splenic infarction, and septic pulmonary emboli presenting to the hospital with a one month history of abdominal pain and back pain. Patient states her symptoms have been getting worse for the past 2 days and that is why she decided to seek medical attention. States her pain is located in the left flank region and radiates to the left side of the abdomen. She describes the pain as intermittent in nature, 10 out of 10 in severity, "sharp, cramping, churning." States she has been taking Tylenol to help alleviate the pain. Denies having any falls or trauma to the back area. States she has not been able to bear weight effectively on her left leg and struggles to climb stairs. Reports having fevers, chills, and diaphoresis. Denies having any nausea, vomiting, or diarrhea. Also reports experiences sharp, substernal chest pain and shortness of breath when bending over or climbing stairs. Denies having any numbness or tingling in her extremities. Patient states she has been hospitalized several times in the past for endocarditis and has left the hospital [place] on several occasions due to her drug addiction problem. Reports using IV Opana 40 mg daily and IV cocaine occasionally. States she has been using drugs since age of 46 and has been to rehabilitation  several times.  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 She had a 5 x 8 mm vegetation on the coronary cusp and was started on IV vancomycin. Patient left AGAINST MEDICAL ADVICE at least 3 times during that hospitalization and never completed her course of the antibiotic. She was recently hospitalized on 11/27/2015 again for MRSA valve endocarditis. A TTE done during this hospitalization did not show a vegetation. The patient was treated with IV daptomycin. Vancomycin was avoided due to her acute kidney failure, anemia, and thrombocytopenia. Blood cultures did not grow any organisms after 3 days. Cardiology was consulted and they did not feel she was a good surgical candidate. The plan was to discharge the patient with 6 weeks of IV antibiotics but she left the hospital [place].   Meds: Current Facility-Administered Medications  Medication Dose Route Frequency Provider Last Rate Last Dose  . 0.9 %  sodium chloride infusion   Intravenous Once Ejiroghene E Emokpae, MD      . DAPTOmycin (CUBICIN) 410 mg in sodium chloride 0.9 % IVPB  410 mg Intravenous Q24H Erenest Blank, RPH      . diclofenac sodium (VOLTAREN) 1 % transdermal gel 4 g  4 g Topical QID Ejiroghene Arlyce Dice, MD      . enoxaparin (  LOVENOX) injection 30 mg  30 mg Subcutaneous Q24H Ejiroghene E Emokpae, MD      . polyethylene glycol (MIRALAX / GLYCOLAX) packet 17 g  17 g Oral BID Bethena Roys, MD   17 g at 12/17/15 0239  . senna (SENOKOT) tablet 17.2 mg  2 tablet Oral Daily Ejiroghene Arlyce Dice, MD   17.2 mg at 12/17/15 0239    Allergies: Allergies as of 12/16/2015  . (No Known Allergies)   Past Medical History  Diagnosis Date  . IV drug user   . MRSA (methicillin resistant Staphylococcus aureus) infection   . Abscess of skin     buttock - most recently 2009/10    Past Surgical History  Procedure Laterality Date  . Tee without cardioversion N/A 07/04/2013    Procedure: TRANSESOPHAGEAL ECHOCARDIOGRAM (TEE);  Surgeon: Larey Dresser, MD;  Location: [facility] ENDOSCOPY;  Service: Cardiovascular;  Laterality: N/A;  . Tonsillectomy      30 years old   No family history on file. Social History   Social History  . Marital Status: Single    Spouse Name: N/A  . Number of Children: N/A  . Years of Education: N/A   Occupational History  . Not on file.   Social History Main Topics  . Smoking status: Current Every Day Smoker -- 1.00 packs/day    Types: Cigarettes  . Smokeless tobacco: Not on file  . Alcohol Use: No  . Drug Use: Yes    Special: IV, Oxycodone     Comment: opiods   . Sexual Activity: Not on file   Other Topics Concern  . Not on file   Social History Narrative    Review of Systems: Review of Systems  Constitutional: Positive for fever and chills.  HENT: Negative for congestion and sore throat.   Eyes: Negative for blurred vision and pain.  Respiratory: Positive for shortness of breath. Negative for cough, sputum production and wheezing.   Cardiovascular: Positive for chest pain. Negative for palpitations and leg swelling.  Gastrointestinal: Positive for abdominal pain and constipation. Negative for nausea, vomiting and diarrhea.  Genitourinary: Negative for dysuria, urgency and frequency.  Musculoskeletal: Positive for back pain. Negative for falls.  Skin: Negative for itching and rash.  Neurological: Negative for dizziness, tingling, sensory change, focal weakness and headaches.    Physical Exam: Blood pressure 136/95, pulse 83, temperature 98.7 F (37.1 C), temperature source Oral, resp. rate 20, height 5\' 2"  (1.575 m), weight 50.984 kg (112 lb 6.4 oz), last menstrual period 11/11/2015, SpO2 99 %. Physical Exam  Constitutional: She is oriented to person, place, and time. She appears well-developed and well-nourished. No  distress.  [place] Caucasian female lying comfortably in hospital bed  HENT:  Head: Normocephalic and atraumatic.  Mouth/Throat: Oropharynx is clear and moist.  Eyes: EOM are normal. Pupils are equal, round, and reactive to light. No scleral icterus.  Neck: Neck supple. No tracheal deviation present.  Cardiovascular: Normal rate, regular rhythm and intact distal pulses.  Exam reveals no gallop and no friction rub.   Murmur heard. Systolic murmur appreciated on the left lower sternal border  Pulmonary/Chest: Effort normal and breath sounds normal. No respiratory distress. She has no wheezes. She has no rales.  Abdominal: Soft. She exhibits distension.  Hyperactive bowel sounds Abdomen diffusely tender to palpation, however, no rebound, guarding, or rigidity. Splenomegaly appreciated  Musculoskeletal: She exhibits no edema.  Spine in the lumbar region tender to palpation.  Tenderness on palpation of the left iliac  crest.  Neurological: She is alert and oriented to person, place, and time.  Skin: Skin is warm and dry. She is not diaphoretic.  No splinter hemorrhages, Janeway lesions, or Osler's nodes appreciated at this time, however, patient did have scars from old lesions on her palms.     Lab results: Basic Metabolic Panel:  Recent Labs  12/16/15 1837  NA 140  K 3.6  CL 101  CO2 26  GLUCOSE 117*  BUN 19  CREATININE 1.19*  CALCIUM 9.1   Liver Function Tests:  Recent Labs  12/16/15 1837  AST 18  ALT 13*  ALKPHOS 119  BILITOT 0.2*  PROT 8.2*  ALBUMIN 3.0*    Recent Labs  12/16/15 1837  LIPASE 52*   CBC:  Recent Labs  12/16/15 1837  WBC 2.7*  NEUTROABS 1.2*  HGB 6.9*  HCT 22.8*  MCV 80.0  PLT 202   Coagulation:  Recent Labs  12/16/15 2105  LABPROT 14.8  INR 1.14   Urine Drug Screen: Drugs of Abuse     Component Value Date/Time   LABOPIA POSITIVE* 12/16/2015 1840   COCAINSCRNUR POSITIVE* 12/16/2015 1840   LABBENZ NONE DETECTED 12/16/2015 1840    AMPHETMU NONE DETECTED 12/16/2015 1840   THCU NONE DETECTED 12/16/2015 1840   LABBARB NONE DETECTED 12/16/2015 1840    Urinalysis:  Recent Labs  12/16/15 1840  COLORURINE AMBER*  LABSPEC 1.020  PHURINE 6.0  GLUCOSEU NEGATIVE  HGBUR LARGE*  BILIRUBINUR NEGATIVE  KETONESUR NEGATIVE  PROTEINUR 100*  NITRITE NEGATIVE  LEUKOCYTESUR NEGATIVE   Imaging results:  Ct Abdomen Pelvis W Contrast  12/17/2015  CLINICAL DATA:  Abdominal and back pain. Recent diagnosis of endocarditis and splenomegaly. Worsening abdominal distention. Last bowel movement 1 week ago. History of intravenous drug use. EXAM: CT ABDOMEN AND PELVIS WITH CONTRAST TECHNIQUE: Multidetector CT imaging of the abdomen and pelvis was performed using the standard protocol following bolus administration of intravenous contrast. CONTRAST:  149mL OMNIPAQUE IOHEXOL 300 MG/ML  SOLN COMPARISON:  CT abdomen pelvis November 27, 2015 FINDINGS: LUNG BASES: Lung bases are clear. The heart appears moderately enlarged with stable moderate pericardial effusion. Resolution of LEFT pleural effusion. SOLID ORGANS: Massive splenomegaly is stable from prior imaging (20.3 cm in cranial caudad dimension) with similar ill-defined hypo enhancement superior aspect of the spleen. Liver, gallbladder, pancreas appear normal. Thickened adrenal glands can be seen with hyperplasia. GASTROINTESTINAL TRACT: The stomach, small and large bowel are normal in course and caliber without inflammatory changes. Moderate amount of retained large bowel stool. Normal appendix. KIDNEYS/ URINARY TRACT: Kidneys are orthotopic, demonstrating symmetric enhancement. No nephrolithiasis, hydronephrosis or solid renal masses. The unopacified ureters are normal in course and caliber. Urinary bladder is partially distended with mild circumferential wall thickening. PERITONEUM/RETROPERITONEUM: Aortoiliac vessels are normal in course and caliber. Mild similar retroperitoneal lymphadenopathy. IUD  in central uterus. Small amount of ascites, decreased from prior examination. SOFT TISSUE/OSSEOUS STRUCTURES: New multiple subcentimeter endplate lytic lesions in all lumbar vertebra, S1 and T10, T12. Mild anasarca, improved from prior CT. IMPRESSION: New subcentimeter lytic lesions in the thoracolumbar and sacral spine. Given acuity, and history of endocarditis these are concerning for osteomyelitis. Similar massive splenomegaly. Stable splenic probable infarct. Mild retroperitoneal lymphadenopathy is likely reactive. Moderate amount of retained large bowel stool without bowel obstruction. Mild urinary bladder wall thickening could can be seen with cystitis. Resolution of LEFT pleural effusion, small amount of ascites and improved anasarca. Stable cardiomegaly and moderate pericardial effusion. Electronically Signed   By: Sandie Ano  Bloomer M.D.   On: 12/17/2015 00:04    Assessment & Plan by Problem: Active Problems:   Subacute bacterial endocarditis ([place])   Endocarditis  Subacute bacterial endocarditis Patient has not been able to receive full course of antibiotic treatment in the past due to her leaving AMA on several occasions. Reports having subjective fevers and chills. Vitals stable on admission. TTE from 11/28/2015 showing left ventricular ejection fraction 50-55%, trivial aortic valve regurgitation, mild right ventricular dilation, moderate tricuspid valve regurgitation, and mild elevation of pulmonary artery pressure (43 mmHg). Moderate free-flowing pericardial effusion was identified circumferential to the heart. No vegetations were seen on this study. TEE from 11/05/2015 was positive for endocarditis. A small, pedunculated round mobile mass measuring approximately 0.6 x 0.8 cm was identified on the right coronary cusp of aortic valve. There was no pericardial effusion at that time During her previous hospitalization, she was started on IV daptomycin but never finished the course.  -Admitted to  telemetry -Start treating with IV daptomycin again. Patient will need a full 6 week course of treatment. -Avoid Vancomycin in the setting of acute kidney failure and anemia.   -Follow-up echo in am  -Follow-up a.m. labs: BMP -Blood cultures pending -Consider consulting palliative care  Splenomegaly with splenic infarction Patient is presenting with left-sided abdominal pain and flank pain. Physical examination remarkable for diffuse abdominal tenderness but no rebound, guarding, or rigidity. CT of abdomen and pelvis with contrast showing new subcentimeter lytic lesions in the thoracolumbar and sacral spine which are concerning for osteomyelitis. Splenic infarct is stable.  -IV Daptomycin -Voltaren gel 4 times daily as needed  Acute kidney failure  Serum creatinine 1.19, improved from 1.5 on 11/27/2015. -Encouraged by mouth intake -Monitor BMP  Anemia Hemoglobin 6.9 on admission likely due to bone marrow suppression from endocarditis. Splenic sequestration of red blood cells might also be playing a role.  -Transfuse with 1 unit of packed red blood cells -Goal hemoglobin 7 -Monitor daily CBC  Leukopenia White count 2.7 likely due to bone marrow suppression from endocarditis and chronic IV drug use. -Continue to monitor CBC  Constipation -Miralax twice daily  -Senokot  DVT/PE prophylaxis: Lovenox   Diet: Regular   Dispo: Disposition is deferred at this time, awaiting improvement of current medical problems. Anticipated discharge in approximately 2-3 day(s).   The patient does have a current PCP (No Pcp Per Patient) and does need an [facility] hospital follow-up appointment after discharge.  The patient does not have transportation limitations that hinder transportation to clinic appointments.  Signed: Shela Leff, MD 12/17/2015, 4:10 AM

## 2015-12-17 NOTE — Progress Notes (Signed)
Subjective: Molly [name]. Patient complains of sweats over night and continued LLQ, sharp, stabbing abdominal pain.  She understands that she has endocarditis and has septic emboli to multiple sites.  She understands that she needs antibiotics for treatment and that we will try to manage her pain while she is here.  Objective: Vital signs in last 24 hours: Filed Vitals:   12/16/15 2230 12/16/15 2337 12/17/15 0008 12/17/15 0617  BP: 130/94  136/95 141/88  Pulse: 78  83   Temp:  98.4 F (36.9 C) 98.7 F (37.1 C)   TempSrc:  Oral Oral   Resp: 16  20 18   Height:   5\' 2"  (1.575 m)   Weight:   112 lb 6.4 oz (50.984 kg)   SpO2: 100%  99%    Weight change:   Intake/Output Summary (Last 24 hours) at 12/17/15 1050 Last data filed at 12/17/15 1028  Gross per 24 hour  Intake    588 ml  Output   1500 ml  Net   -912 ml   Physical Exam  Constitutional: She is oriented to person, place, and time.  WD, WN, uncomfortable appearing, constantly moving in bed.  HENT:  Head: Normocephalic and atraumatic.  Eyes: EOM are normal. No scleral icterus.  Neck: No JVD present. No tracheal deviation present.  Cardiovascular: Normal rate, regular rhythm and intact distal pulses.   Grade III/VI holosystolic murmur heard best at LUSB.  Pulmonary/Chest: Effort normal and breath sounds normal. No stridor. No respiratory distress. She has no wheezes.  Good air movement throughout.  Abdominal: Soft. She exhibits no distension. There is no rebound and no guarding.  Diffusely tender to palpation.  Liver palpable 3 cm below costal margin.  Spleen palpable 5-6 cm below costal margin, near umbilicus.  Musculoskeletal: She exhibits no edema.  Neurological: She is alert and oriented to person, place, and time.  Skin: Skin is warm and dry.  Not diaphoretic.    Lab Results: Basic Metabolic Panel:  Recent Labs Lab 12/16/15 1837 12/17/15 0550  NA 140 139  K 3.6 3.2*  CL 101 105  CO2 26 25  GLUCOSE 117* 98  BUN  19 15  CREATININE 1.19* 1.10*  CALCIUM 9.1 8.7*   Liver Function Tests:  Recent Labs Lab 12/16/15 1837  AST 18  ALT 13*  ALKPHOS 119  BILITOT 0.2*  PROT 8.2*  ALBUMIN 3.0*    Recent Labs Lab 12/16/15 1837  LIPASE 52*   No results for input(s): AMMONIA in the last 168 hours. CBC:  Recent Labs Lab 12/16/15 1837  WBC 2.7*  NEUTROABS 1.2*  HGB 6.9*  HCT 22.8*  MCV 80.0  PLT 202   Cardiac Enzymes:  Recent Labs Lab 12/17/15 0550  CKTOTAL 15*   BNP: No results for input(s): PROBNP in the last 168 hours. D-Dimer: No results for input(s): DDIMER in the last 168 hours. CBG: No results for input(s): GLUCAP in the last 168 hours. Hemoglobin A1C: No results for input(s): HGBA1C in the last 168 hours. Fasting Lipid Panel: No results for input(s): CHOL, HDL, LDLCALC, TRIG, CHOLHDL, LDLDIRECT in the last 168 hours. Thyroid Function Tests: No results for input(s): TSH, T4TOTAL, FREET4, T3FREE, THYROIDAB in the last 168 hours. Coagulation:  Recent Labs Lab 12/16/15 2105  LABPROT 14.8  INR 1.14   Anemia Panel: No results for input(s): VITAMINB12, FOLATE, FERRITIN, TIBC, IRON, RETICCTPCT in the last 168 hours. Urine Drug Screen: Drugs of Abuse     Component Value Date/Time  LABOPIA POSITIVE* 12/16/2015 1840   COCAINSCRNUR POSITIVE* 12/16/2015 1840   LABBENZ NONE DETECTED 12/16/2015 1840   AMPHETMU NONE DETECTED 12/16/2015 1840   THCU NONE DETECTED 12/16/2015 1840   LABBARB NONE DETECTED 12/16/2015 1840    Alcohol Level: No results for input(s): ETH in the last 168 hours. Urinalysis:  Recent Labs Lab 12/16/15 [place] 1.020  PHURINE 6.0  GLUCOSEU NEGATIVE  HGBUR LARGE*  BILIRUBINUR NEGATIVE  KETONESUR NEGATIVE  PROTEINUR 100*  NITRITE NEGATIVE  LEUKOCYTESUR NEGATIVE   Misc. Labs:   Micro Results: Recent Results (from the past 240 hour(s))  MRSA PCR Screening     Status: None   Collection Time: 12/17/15 12:43 AM    Result Value Ref Range Status   MRSA by PCR NEGATIVE NEGATIVE Final    Comment:        The GeneXpert MRSA Assay (FDA approved for NASAL specimens only), is one component of a comprehensive MRSA colonization surveillance program. It is not intended to diagnose MRSA infection nor to guide or monitor treatment for MRSA infections.    Studies/Results: Ct Head Wo Contrast  12/17/2015  CLINICAL DATA:  History of endocarditis EXAM: CT HEAD WITHOUT CONTRAST TECHNIQUE: Contiguous axial images were obtained from the base of the skull through the vertex without intravenous contrast. COMPARISON:  None. FINDINGS: Bony calvarium is intact. No findings to suggest acute hemorrhage, acute infarction or space-occupying mass lesion are noted. IMPRESSION: No acute abnormality noted. Electronically Signed   By: Inez Catalina M.D.   On: 12/17/2015 10:26   Ct Abdomen Pelvis W Contrast  12/17/2015  CLINICAL DATA:  Abdominal and back pain. Recent diagnosis of endocarditis and splenomegaly. Worsening abdominal distention. Last bowel movement 1 week ago. History of intravenous drug use. EXAM: CT ABDOMEN AND PELVIS WITH CONTRAST TECHNIQUE: Multidetector CT imaging of the abdomen and pelvis was performed using the standard protocol following bolus administration of intravenous contrast. CONTRAST:  115mL OMNIPAQUE IOHEXOL 300 MG/ML  SOLN COMPARISON:  CT abdomen pelvis November 27, 2015 FINDINGS: LUNG BASES: Lung bases are clear. The heart appears moderately enlarged with stable moderate pericardial effusion. Resolution of LEFT pleural effusion. SOLID ORGANS: Massive splenomegaly is stable from prior imaging (20.3 cm in cranial caudad dimension) with similar ill-defined hypo enhancement superior aspect of the spleen. Liver, gallbladder, pancreas appear normal. Thickened adrenal glands can be seen with hyperplasia. GASTROINTESTINAL TRACT: The stomach, small and large bowel are normal in course and caliber without inflammatory  changes. Moderate amount of retained large bowel stool. Normal appendix. KIDNEYS/ URINARY TRACT: Kidneys are orthotopic, demonstrating symmetric enhancement. No nephrolithiasis, hydronephrosis or solid renal masses. The unopacified ureters are normal in course and caliber. Urinary bladder is partially distended with mild circumferential wall thickening. PERITONEUM/RETROPERITONEUM: Aortoiliac vessels are normal in course and caliber. Mild similar retroperitoneal lymphadenopathy. IUD in central uterus. Small amount of ascites, decreased from prior examination. SOFT TISSUE/OSSEOUS STRUCTURES: New multiple subcentimeter endplate lytic lesions in all lumbar vertebra, S1 and T10, T12. Mild anasarca, improved from prior CT. IMPRESSION: New subcentimeter lytic lesions in the thoracolumbar and sacral spine. Given acuity, and history of endocarditis these are concerning for osteomyelitis. Similar massive splenomegaly. Stable splenic probable infarct. Mild retroperitoneal lymphadenopathy is likely reactive. Moderate amount of retained large bowel stool without bowel obstruction. Mild urinary bladder wall thickening could can be seen with cystitis. Resolution of LEFT pleural effusion, small amount of ascites and improved anasarca. Stable cardiomegaly and moderate pericardial effusion. Electronically Signed   By: Sandie Ano  Bloomer M.D.   On: 12/17/2015 00:04   Medications: I have reviewed the patient's current medications. Scheduled Meds: . DAPTOmycin (CUBICIN)  IV  410 mg Intravenous Q24H  . diclofenac sodium  4 g Topical QID  . enoxaparin (LOVENOX) injection  30 mg Subcutaneous Q24H  . oxyCODONE  10 mg Oral Q12H  . polyethylene glycol  17 g Oral BID  . senna  2 tablet Oral Daily   Continuous Infusions:  PRN Meds:. Assessment/Plan: Active Problems:   Subacute bacterial endocarditis ([place])   Endocarditis  Ms. Dellarocco is a 30 yo female with PMH of IV drug use, MRSA valve endocarditis, splenic infarction, and septic  pulmonary emboli presenting to the hospital with a one month history of abdominal pain and back pain.   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 TTE from 11/28/2015 showing left ventricular ejection fraction 50-55%, trivial aortic valve regurgitation, mild right ventricular dilation, moderate tricuspid valve regurgitation, and mild elevation of pulmonary artery pressure (43 mmHg). Moderate free-flowing pericardial effusion was identified circumferential to the heart. No vegetations were seen on this study. TEE from 11/05/2015 was positive for endocarditis. A small, pedunculated round mobile mass measuring approximately 0.6 x 0.8 cm was identified on the right coronary cusp of aortic valve. During her previous hospitalization, she was started on IV daptomycin but never finished the course. 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  Palliative care consulted for help with symptom management in this patient to help ensure she remains here for full antibiotic course. - IV daptomycin. Patient will need a full 6 week course of treatment (3/1 - ?). -Avoid Vancomycin in the setting of acute kidney failure and anemia.  [ ]  TTE [ ]  Blood cultures - Palliative care consult. We appreciate your help and recommendations  Opioid Addiction: Patient reports regular use of Opana 40 mg daily for opioid addiction.  She has h/o leaving AMA in order to resume her habit prior to completion of antibiotics.  At last admission, she left while receiving Oxycontin 10 mg q12h.  Will restart at this dose and appreciate Palliative care's recommendations for dosage adjustments for pain control. - Oxycodone 10 mg q12h scheduled - Low tolerance for dose adjustment to prevent withdrawal and keep patient here for effective treatment.  Splenomegaly with splenic infarction: Patient is presenting with left-sided abdominal pain and flank pain.  Physical examination remarkable for diffuse abdominal tenderness but no rebound, guarding, or rigidity. CT of abdomen and pelvis with contrast showing new subcentimeter lytic lesions in the thoracolumbar and sacral spine which are concerning for osteomyelitis. Splenic infarct is stable.  -IV Daptomycin -Voltaren gel 4 times daily as needed  Acute kidney failure: Serum creatinine 1.19, improved from 1.5 on 11/27/2015. -Encouraged by mouth intake  Anemia: Hemoglobin 6.9 on admission likely due to bone marrow suppression from endocarditis. Splenic sequestration of red blood cells might also be playing a role. S/p 1 U pRBC. - Transfuse Hgb < 7  Leukopenia: White count 2.7 likely due to bone marrow suppression from endocarditis and chronic IV drug use.  Constipation -Miralax twice daily -Senokot  DVT/PE prophylaxis: Lovenox  Diet: Regular  Dispo: Disposition is deferred at this time, awaiting improvement of current medical problems.    The patient does not have a current PCP (No Pcp Per Patient) and does need an [facility] hospital follow-up appointment after discharge.  The patient does not have transportation limitations that hinder transportation to clinic appointments.  Marland Kitchen  Services Needed at time of discharge: Y = Yes, Blank = No PT:   OT:   RN:   Equipment:   Other:     LOS: 1 day   Iline Oven, MD 12/17/2015, 10:50 AM

## 2015-12-18 DIAGNOSIS — Z515 Encounter for palliative care: Secondary | ICD-10-CM

## 2015-12-18 LAB — BASIC METABOLIC PANEL
Anion gap: 9 (ref 5–15)
BUN: 15 mg/dL (ref 6–20)
CHLORIDE: 107 mmol/L (ref 101–111)
CO2: 23 mmol/L (ref 22–32)
CREATININE: 1.11 mg/dL — AB (ref 0.44–1.00)
Calcium: 8.8 mg/dL — ABNORMAL LOW (ref 8.9–10.3)
GFR calc non Af Amer: >60 mL/min (ref >60)
GLUCOSE: 107 mg/dL — AB (ref 65–99)
POTASSIUM: 3.2 mmol/L — AB (ref 3.5–5.1)
SODIUM: 139 mmol/L (ref 135–145)

## 2015-12-18 LAB — CBC WITH DIFFERENTIAL/PLATELET
Basophils Absolute: 0 10*3/uL (ref 0.0–0.1)
Basophils Relative: 1 %
EOS ABS: 0 10*3/uL (ref 0.0–0.7)
Eosinophils Relative: 1 %
HCT: 25 % — ABNORMAL LOW (ref 36.0–46.0)
HEMOGLOBIN: 8.4 g/dL — AB (ref 12.0–15.0)
LYMPHS ABS: 1 10*3/uL (ref 0.7–4.0)
Lymphocytes Relative: 31 %
MCH: 26.5 pg (ref 26.0–34.0)
MCHC: 33.6 g/dL (ref 30.0–36.0)
MCV: 78.9 fL (ref 78.0–100.0)
Monocytes Absolute: 0.2 10*3/uL (ref 0.1–1.0)
Monocytes Relative: 5 %
NEUTROS PCT: 62 %
Neutro Abs: 2.1 10*3/uL (ref 1.7–7.7)
Platelets: 184 10*3/uL (ref 150–400)
RBC: 3.17 MIL/uL — AB (ref 3.87–5.11)
RDW: 17.8 % — ABNORMAL HIGH (ref 11.5–15.5)
WBC: 3.3 10*3/uL — AB (ref 4.0–10.5)

## 2015-12-18 MED ORDER — SODIUM CHLORIDE 0.9 % IV SOLN
500.0000 mg | INTRAVENOUS | Status: DC
  Administered 2015-12-19 – 2015-12-22 (×4): 500 mg via INTRAVENOUS
  Filled 2015-12-18 (×5): qty 10

## 2015-12-18 MED ORDER — DIPHENHYDRAMINE HCL 25 MG PO CAPS: 25.0000 mg | ORAL_CAPSULE | Freq: Every evening | ORAL | Status: DC | PRN

## 2015-12-18 MED ORDER — OXYCODONE HCL ER 10 MG PO T12A
20.0000 mg | EXTENDED_RELEASE_TABLET | Freq: Two times a day (BID) | ORAL | Status: DC
  Administered 2015-12-18 – 2015-12-19 (×2): 20 mg via ORAL
  Filled 2015-12-18 (×2): qty 2

## 2015-12-18 MED ORDER — OXYCODONE HCL 5 MG PO TABS
10.0000 mg | ORAL_TABLET | Freq: Four times a day (QID) | ORAL | Status: DC | PRN
  Administered 2015-12-19 (×2): 10 mg via ORAL
  Filled 2015-12-18 (×2): qty 2

## 2015-12-18 MED ORDER — POTASSIUM CHLORIDE CRYS ER 20 MEQ PO TBCR
40.0000 meq | EXTENDED_RELEASE_TABLET | Freq: Once | ORAL | Status: AC
  Administered 2015-12-18: 40 meq via ORAL

## 2015-12-18 MED ORDER — DIPHENHYDRAMINE HCL 25 MG PO CAPS: 50.0000 mg | ORAL_CAPSULE | Freq: Every evening | ORAL | Status: DC | PRN

## 2015-12-18 MED ORDER — OXYCODONE HCL 5 MG PO TABS
5.0000 mg | ORAL_TABLET | Freq: Four times a day (QID) | ORAL | Status: DC | PRN
  Administered 2015-12-18: 5 mg via ORAL
  Filled 2015-12-18: qty 1

## 2015-12-18 MED ORDER — ACETAMINOPHEN 325 MG PO TABS
650.0000 mg | ORAL_TABLET | Freq: Four times a day (QID) | ORAL | Status: DC
  Administered 2015-12-18 – 2015-12-22 (×16): 650 mg via ORAL
  Filled 2015-12-18 (×16): qty 2

## 2015-12-18 MED ORDER — TRAZODONE HCL 50 MG PO TABS
50.0000 mg | ORAL_TABLET | Freq: Every evening | ORAL | Status: DC | PRN
  Administered 2015-12-18 – 2015-12-21 (×4): 50 mg via ORAL
  Filled 2015-12-18 (×4): qty 1

## 2015-12-18 NOTE — Progress Notes (Signed)
Subjective: [name]. Patient denies fever, chills, CP, SOB, or diarrhea.  She has a had a few sweats and continues to complain of back pain.  Objective: Vital signs in last 24 hours: Filed Vitals:   12/17/15 1301 12/17/15 2158 12/17/15 2200 12/18/15 0500  BP: 143/86 160/126 154/104 145/95  Pulse: 69 75  66  Temp: 98.3 F (36.8 C) 98.3 F (36.8 C)  98 F (36.7 C)  TempSrc: Oral Oral  Oral  Resp: 20 18  18   Height:      Weight:      SpO2: 98% 97%  96%   Weight change:   Intake/Output Summary (Last 24 hours) at 12/18/15 1051 Last data filed at 12/17/15 1700  Gross per 24 hour  Intake    455 ml  Output   1250 ml  Net   -795 ml   Physical Exam  Constitutional: She is oriented to person, place, and time.  WD, WN, constantly moving in bed, NAD.  HENT:  Head: Normocephalic and atraumatic.  Eyes: EOM are normal. No scleral icterus.  Neck: No JVD present. No tracheal deviation present.  Cardiovascular: Normal rate, regular rhythm and intact distal pulses.   Grade III/VI holosystolic murmur heard best at LUSB.  Pulmonary/Chest: Effort normal and breath sounds normal. No stridor. No respiratory distress. She has no wheezes.  Good air movement throughout.  Abdominal: Soft. She exhibits no distension. There is no rebound and no guarding.  Diffusely tender to palpation.  Liver palpable 3 cm below costal margin.  Spleen palpable 5-6 cm below costal margin, near umbilicus.  Musculoskeletal: She exhibits no edema.  Neurological: She is alert and oriented to person, place, and time.  Skin: Skin is warm and dry.  Not diaphoretic.    Lab Results: Basic Metabolic Panel:  Recent Labs Lab 12/17/15 0550 12/18/15 0919  NA 139 139  K 3.2* 3.2*  CL 105 107  CO2 25 23  GLUCOSE 98 107*  BUN 15 15  CREATININE 1.10* 1.11*  CALCIUM 8.7* 8.8*   Liver Function Tests:  Recent Labs Lab 12/16/15 1837  AST 18  ALT 13*  ALKPHOS 119  BILITOT 0.2*  PROT 8.2*  ALBUMIN 3.0*     Recent Labs Lab 12/16/15 1837  LIPASE 52*   No results for input(s): AMMONIA in the last 168 hours. CBC:  Recent Labs Lab 12/16/15 1837 12/18/15 0919  WBC 2.7* 3.3*  NEUTROABS 1.2* 2.1  HGB 6.9* 8.4*  HCT 22.8* 25.0*  MCV 80.0 78.9  PLT 202 184   Cardiac Enzymes:  Recent Labs Lab 12/17/15 0550  CKTOTAL 15*   BNP: No results for input(s): PROBNP in the last 168 hours. D-Dimer: No results for input(s): DDIMER in the last 168 hours. CBG: No results for input(s): GLUCAP in the last 168 hours. Hemoglobin A1C: No results for input(s): HGBA1C in the last 168 hours. Fasting Lipid Panel: No results for input(s): CHOL, HDL, LDLCALC, TRIG, CHOLHDL, LDLDIRECT in the last 168 hours. Thyroid Function Tests: No results for input(s): TSH, T4TOTAL, FREET4, T3FREE, THYROIDAB in the last 168 hours. Coagulation:  Recent Labs Lab 12/16/15 2105  LABPROT 14.8  INR 1.14   Anemia Panel: No results for input(s): VITAMINB12, FOLATE, FERRITIN, TIBC, IRON, RETICCTPCT in the last 168 hours. Urine Drug Screen: Drugs of Abuse     Component Value Date/Time   LABOPIA POSITIVE* 12/16/2015 1840   COCAINSCRNUR POSITIVE* 12/16/2015 1840   LABBENZ NONE DETECTED 12/16/2015 [place] DETECTED 12/16/2015 1840  THCU NONE DETECTED 12/16/2015 1840   LABBARB NONE DETECTED 12/16/2015 1840    Alcohol Level: No results for input(s): ETH in the last 168 hours. Urinalysis:  Recent Labs Lab 12/16/15 [place] 1.020  PHURINE 6.0  GLUCOSEU NEGATIVE  HGBUR LARGE*  BILIRUBINUR NEGATIVE  KETONESUR NEGATIVE  PROTEINUR 100*  NITRITE NEGATIVE  LEUKOCYTESUR NEGATIVE   Misc. Labs:   Micro Results: Recent Results (from the past 240 hour(s))  Culture, blood (Routine X 2) w Reflex to ID Panel     Status: None (Preliminary result)   Collection Time: 12/16/15  6:37 PM  Result Value Ref Range Status   Specimen Description BLOOD RIGHT ANTECUBITAL  Final    Special Requests IN PEDIATRIC BOTTLE 1CC  Final   Culture NO GROWTH < 24 HOURS  Final   Report Status PENDING  Incomplete  Culture, blood (Routine X 2) w Reflex to ID Panel     Status: None (Preliminary result)   Collection Time: 12/16/15  8:56 PM  Result Value Ref Range Status   Specimen Description BLOOD RIGHT HAND  Final   Special Requests IN PEDIATRIC BOTTLE 1CC  Final   Culture NO GROWTH < 24 HOURS  Final   Report Status PENDING  Incomplete  MRSA PCR Screening     Status: None   Collection Time: 12/17/15 12:43 AM  Result Value Ref Range Status   MRSA by PCR NEGATIVE NEGATIVE Final    Comment:        The GeneXpert MRSA Assay (FDA approved for NASAL specimens only), is one component of a comprehensive MRSA colonization surveillance program. It is not intended to diagnose MRSA infection nor to guide or monitor treatment for MRSA infections.    Studies/Results: Ct Head Wo Contrast  12/17/2015  CLINICAL DATA:  History of endocarditis EXAM: CT HEAD WITHOUT CONTRAST TECHNIQUE: Contiguous axial images were obtained from the base of the skull through the vertex without intravenous contrast. COMPARISON:  None. FINDINGS: Bony calvarium is intact. No findings to suggest acute hemorrhage, acute infarction or space-occupying mass lesion are noted. IMPRESSION: No acute abnormality noted. Electronically Signed   By: Inez Catalina M.D.   On: 12/17/2015 10:26   Ct Abdomen Pelvis W Contrast  12/17/2015  CLINICAL DATA:  Abdominal and back pain. Recent diagnosis of endocarditis and splenomegaly. Worsening abdominal distention. Last bowel movement 1 week ago. History of intravenous drug use. EXAM: CT ABDOMEN AND PELVIS WITH CONTRAST TECHNIQUE: Multidetector CT imaging of the abdomen and pelvis was performed using the standard protocol following bolus administration of intravenous contrast. CONTRAST:  123mL OMNIPAQUE IOHEXOL 300 MG/ML  SOLN COMPARISON:  CT abdomen pelvis November 27, 2015 FINDINGS: LUNG  BASES: Lung bases are clear. The heart appears moderately enlarged with stable moderate pericardial effusion. Resolution of LEFT pleural effusion. SOLID ORGANS: Massive splenomegaly is stable from prior imaging (20.3 cm in cranial caudad dimension) with similar ill-defined hypo enhancement superior aspect of the spleen. Liver, gallbladder, pancreas appear normal. Thickened adrenal glands can be seen with hyperplasia. GASTROINTESTINAL TRACT: The stomach, small and large bowel are normal in course and caliber without inflammatory changes. Moderate amount of retained large bowel stool. Normal appendix. KIDNEYS/ URINARY TRACT: Kidneys are orthotopic, demonstrating symmetric enhancement. No nephrolithiasis, hydronephrosis or solid renal masses. The unopacified ureters are normal in course and caliber. Urinary bladder is partially distended with mild circumferential wall thickening. PERITONEUM/RETROPERITONEUM: Aortoiliac vessels are normal in course and caliber. Mild similar retroperitoneal lymphadenopathy. IUD in central uterus. Small  amount of ascites, decreased from prior examination. SOFT TISSUE/OSSEOUS STRUCTURES: New multiple subcentimeter endplate lytic lesions in all lumbar vertebra, S1 and T10, T12. Mild anasarca, improved from prior CT. IMPRESSION: New subcentimeter lytic lesions in the thoracolumbar and sacral spine. Given acuity, and history of endocarditis these are concerning for osteomyelitis. Similar massive splenomegaly. Stable splenic probable infarct. Mild retroperitoneal lymphadenopathy is likely reactive. Moderate amount of retained large bowel stool without bowel obstruction. Mild urinary bladder wall thickening could can be seen with cystitis. Resolution of LEFT pleural effusion, small amount of ascites and improved anasarca. Stable cardiomegaly and moderate pericardial effusion. Electronically Signed   By: Elon Alas M.D.   On: 12/17/2015 00:04   Medications: I have reviewed the patient's  current medications. Scheduled Meds: . DAPTOmycin (CUBICIN)  IV  410 mg Intravenous Q24H  . diclofenac sodium  4 g Topical QID  . enoxaparin (LOVENOX) injection  30 mg Subcutaneous Q24H  . oxyCODONE  10 mg Oral Q12H  . polyethylene glycol  17 g Oral BID  . potassium chloride  40 mEq Oral Once  . senna  2 tablet Oral Daily   Continuous Infusions:  PRN Meds:. Assessment/Plan: Principal Problem:   Subacute bacterial endocarditis ([facility]) Active Problems:   Septic pulmonary embolism ([facility])   Intravenous drug abuse, continuous   Pulmonary HTN ([place])   Aortic valve endocarditis   Splenic infarction   Endocarditis  Molly [name] is a 30 yo female with PMH of IV drug use, MRSA valve endocarditis, splenic infarction, and septic pulmonary emboli presenting to the hospital with a one month history of abdominal pain and back pain.   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  Repeat TTE with increased PA pressure without evidence of vegetation. ECG without conduction abnormalities.  Patient has h/o leaving AMA prior to antibiotic completion. Palliative care consulted for help with symptom management in this patient to help ensure she remains here for full antibiotic course. Opana has been documented to be a/w TTP.  Unclear whether it is Opana or septic emboli resulting in her previous anemia, renal failure, and continued hematuria.  No septic emboli seen on CT.   - IV daptomycin. Patient will need a full 6 week course of treatment (3/1 - ?).  [ ]  Blood cultures NGTD - Palliative care consult. We appreciate your help and recommendations  Opioid Addiction: Patient reports regular use of Opana 40 mg daily for opioid addiction.  She has h/o leaving AMA in order to resume her habit prior to completion of  antibiotics.  At last admission, she left while receiving Oxycontin 10 mg q12h.  Due to concern for Opana associated TTP, opioid cessation is again recommended to patient. - Oxycodone 10 mg q12h scheduled - OxyIR 5 mg q6h PRN - Diphenhydramine 50 mg qHS PRN sleep  Splenomegaly with splenic infarction: Patient presenting with left-sided abdominal pain and flank pain and known subcentimeter lytic lesions in the thoracolumbar and sacral spine which are concerning for osteomyelitis. Splenic infarct is stable.  -IV Daptomycin -Voltaren gel 4 times daily as needed  Anemia: Hemoglobin 6.9 on admission likely due to bone marrow suppression from endocarditis. Splenic sequestration of red blood cells might also be playing a role. S/p 1 U pRBC. - Transfuse Hgb < 7  Leukopenia: White count 2.7 likely due to bone marrow suppression from  endocarditis and chronic IV drug use.  Acute kidney failure, resolved: Serum creatinine 1.19, improved from 1.5 on 11/27/2015.  Possibly 2/2 Opana-associated TTP or septic emboli.   - CTM  Constipation -Miralax twice daily -Senokot  DVT/PE prophylaxis: Lovenox  Diet: Regular  Dispo: Disposition is deferred at this time, awaiting improvement of current medical problems.    The patient does not have a current PCP (No Pcp Per Patient) and does need an [facility] hospital follow-up appointment after discharge.  The patient does not have transportation limitations that hinder transportation to clinic appointments.  .Services Needed at time of discharge: Y = Yes, Blank = No PT:   OT:   RN:   Equipment:   Other:     LOS: 2 days   Iline Oven, MD 12/18/2015, 10:51 AM

## 2015-12-18 NOTE — Progress Notes (Signed)
I saw and re-examined patient this evening.  She is reporting worsening of her pain that she is currently 8 out of 10 predominantly in her back.  Her only other complaint is poor sleep and she reports she did not think the Benadryl was as helpful as she needs.  On exam she is fidgety, has dilated pupils, is tachycardic, has piloerection, is mildly diaphoretic, and yawns more than 10 times throughout encounter.  She just received her rescue medication reports it did seem to mildly help her pain.  I called and spoke with on-call resident (Dr. Posey Pronto) regarding the fact that she appears to be showing physical signs of withdrawing in addition to having pain that is out of control.  Reviewed notes from primary team with note to titrate medications as needed to attempt to control pain and minimize withdrawal symptoms.  He was agreeable to deferring medication changes to me as I just seen and examined patient.  I increased her short acting medication of oxycodone to 10 mg every 6 hours as needed. I also increased her long-acting medication to OxyContin 20 mg twice a day. Additionally, I changed her when necessary medication for insomnia to trazodone and she reports she did not think the Benadryl is helpful and she had good success with trazodone while in rehabilitation in the past.       I did reinforce with the patient that we want to be as effective as possible to control her pain, but the overall goal remains to continue to wean opioids as much as possible.  I would not recommend any scripts for opioids be given on discharge if she leaves AGAINST MEDICAL ADVICE.  Micheline Rough, MD [place] Team [phone]

## 2015-12-18 NOTE — [place] FL2 (Signed)
[place] MEDICAID FL2 LEVEL OF CARE SCREENING TOOL     IDENTIFICATION  Patient Name: Molly [name] Birthdate: January 07, 1986 Sex: female Admission Date (Current Location): 12/16/2015  [facility] and [place] Number:  Herbalist and Address:  [facility] [place], [place] [address] [place], [place] [address]      Provider Number: [id number]  Attending Physician Name and Address:  Oval Linsey, MD  Relative Name and Phone Number:  Breanda Eggleston, mother, [phone]    Current Level of Care: Hospital Recommended Level of Care: [place] Prior Approval Number:    Date Approved/Denied:   PASRR Number: [id number]  Discharge Plan: SNF    Current Diagnoses: Patient Active Problem List   Diagnosis Date Noted  . Endocarditis 12/17/2015  . Subacute bacterial endocarditis ([place]) 12/16/2015  . Cocaine use 11/28/2015  . 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 Pancytopenia ([place]) 11/27/2015  . AKI (acute kidney injury) ([place]) 11/27/2015  . Anasarca 11/27/2015  . Anemia, unspecified 07/04/2013  . Pulmonary HTN ([place]) 07/04/2013  . 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 Septic pulmonary embolism ([place]) 07/03/2013  . Intravenous drug abuse, continuous 07/03/2013  . MRSA bacteremia 07/03/2013    Orientation RESPIRATION BLADDER Height & Weight     Self, Time, Situation, Place  Normal Continent Weight: 112 lb 6.4 oz (50.984 kg) Height:  5\' 2"  (157.5 cm)  BEHAVIORAL SYMPTOMS/MOOD NEUROLOGICAL BOWEL NUTRITION STATUS      Continent  (Please see DC summary)  AMBULATORY STATUS COMMUNICATION OF NEEDS Skin   Independent Verbally Normal                       Personal Care Assistance Level of Assistance  Bathing, Feeding, Dressing Bathing Assistance: Independent Feeding assistance: Independent Dressing Assistance: Independent     Functional Limitations Info             SPECIAL CARE FACTORS FREQUENCY                       Contractures      Additional Factors Info  Code Status, Allergies, Isolation Precautions Code Status Info: Full Allergies Info: NKA     Isolation Precautions Info: Contact precautions     Current Medications (12/18/2015):  This is the current hospital active medication list Current Facility-Administered Medications  Medication Dose Route Frequency Provider Last Rate Last Dose  . acetaminophen (TYLENOL) tablet 650 mg  650 mg Oral Q6H Iline Oven, MD      . DAPTOmycin (CUBICIN) 410 mg in sodium chloride 0.9 % IVPB  410 mg Intravenous Q24H Erenest Blank, RPH   410 mg at 12/18/15 0451  . diclofenac sodium (VOLTAREN) 1 % transdermal gel 4 g  4 g Topical QID Bethena Roys, MD   4 g at 12/18/15 0944  . diphenhydrAMINE (BENADRYL) capsule 50 mg  50 mg Oral QHS PRN Iline Oven, MD      . enoxaparin (LOVENOX) injection 30 mg  30 mg Subcutaneous Q24H Ejiroghene Arlyce Dice, MD   30 mg at 12/18/15 0942  . oxyCODONE (Oxy IR/ROXICODONE) immediate release tablet 5 mg  5 mg Oral Q6H PRN Iline Oven, MD      . oxyCODONE (OXYCONTIN) 12 hr tablet 10 mg  10 mg Oral Q12H Ejiroghene Arlyce Dice, MD   10 mg at 12/18/15 0942  . polyethylene glycol (MIRALAX / GLYCOLAX) packet 17 g  17 g Oral BID Bethena Roys, MD   17 g at 12/18/15 0942  . potassium chloride SA (K-DUR,KLOR-CON) CR tablet 40 mEq  40 mEq Oral Once Iline Oven, MD      . senna [facility]) tablet 17.2 mg  2 tablet Oral Daily Ejiroghene Arlyce Dice, MD   17.2 mg at 12/18/15 [id number]     Discharge Medications: Please see discharge summary for a list of discharge medications.  Relevant Imaging Results:  Relevant Lab Results:   Additional Information SSN: [place]  [place], [place]

## 2015-12-18 NOTE — Clinical Social Work Note (Signed)
Clinical Social Work Assessment  Patient Details  Name: Arthella Headings MRN: [id number] Date of Birth: 08/18/1986  Date of referral:  12/18/15               Reason for consult:  Facility Placement                Permission sought to share information with:  Facility Art therapist granted to share information::  No  Name::        Agency::  SNFs  Relationship::     Contact Information:     Housing/Transportation Living arrangements for the past 2 months:  Single Family Home Source of Information:  Patient Patient Interpreter Needed:  None Criminal Activity/Legal Involvement Pertinent to Current Situation/Hospitalization:  No - Comment as needed Significant Relationships:  Parents Lives with:  Self Do you feel safe going back to the place where you live?  Yes Need for family participation in patient care:  No (Coment)  Care giving concerns: CSW received referral for possible SNF placement at time of discharge. CSW met with patient recommendation of SNF placement at time of discharge. Per patient's wife, patient's wife is current. Patient expressed understanding of recommendation and is agreeable to SNF placement at time of discharge. CSW to continue to follow and assist with discharge planning needs.    Social Worker assessment / plan:  CSW spoke with patient and patient's wife concerning possibility of rehab at [facility] before returning home.  Employment status:  Unemployed Forensic scientist:  Self Pay (Medicaid Pending) PT Recommendations:  Not assessed at this time Information / Referral to community resources:  [place]  Patient/Family's Response to care:  Patient recognizes need for IV antibiotics before returning home and is agreeable to a SNF.  Patient/Family's Understanding of and Emotional Response to Diagnosis, Current Treatment, and Prognosis:  Patient is realistic regarding therapy needs. No questions/concerns about plan or treatment.     Emotional Assessment Appearance:  Appears stated age Attitude/Demeanor/Rapport:   (Appropriate) Affect (typically observed):  Accepting, Appropriate Orientation:  Oriented to Self, Oriented to Place, Oriented to  Time, Oriented to Situation Alcohol / Substance use:  Illicit Drugs Psych involvement (Current and /or in the community):  No (Comment)  Discharge Needs  Concerns to be addressed:  Care Coordination Readmission within the last 30 days:  Yes Current discharge risk:  Substance Abuse Barriers to Discharge:  Continued Medical Work up   [employer], [place] 12/18/2015, 5:02 PM

## 2015-12-18 NOTE — Consult Note (Signed)
Consultation Note Date: 12/18/2015   Patient Name: Molly [name]  DOB: Oct 21, 1985  MRN: [id number]  Age / Sex: 30 y.o., female  PCP: No Pcp Per Patient Referring Physician: Oval Linsey, MD  Reason for Consultation: Pain control  Clinical Assessment/Narrative: 30 year old female with a past medical history of IV drug use, MRSA valve endocarditis, splenic infarction, and septic pulmonary emboli admitted with worsening abdominal pain and back pain. Pain was getting worse 2 day prior to admission. Pain is located in the left flank region and radiates to the left side of the abdomen. She describes the pain as intermittent in nature, 10 out of 10 in severity, "sharp." Reports "some" relief from oxycontin/oxycodone, but is noted to be resting comfortable on my exam which is change from restlessness noted earlier this admission.   She has been hospitalized several times in the past for endocarditis and has left the hospital [place] on several occasions. Report crushing one Opana 40 mg tab and injecting it daily.  Denies other drugs or alcohol, then reports "occasional" cocaine use when asked about UDS.  States she has been using drugs since age of 38 and has been to rehabilitation several times.  All her drugs are from the street and she has not been prescribed ant opioids for over a year per review of her [place] controlled substance report.  Seen in January at [facility] where she was diagnosed with MRSA aortic valve endocarditis from IV drug abuse with vegetation on the coronary cusp and was started on IV vancomycin. She left AGAINST MEDICAL ADVICE and never completed her course of the antibiotic. Hospitalized on 11/27/2015 again for MRSA valve endocarditis. TEE this admit did not show a vegetation. Treated with IV daptomycin. Blood cultures did not grow any organisms after 3 days. Cardiology was consulted and  they did not feel she was a good surgical candidate. The plan was to discharge the patient with 6 weeks of IV antibiotics but she left the hospital [place].   SUMMARY OF RECOMMENDATIONS Pain complicated by active substance abuse: - Agree with current dosing of OxyContin and oxycodone. I do think there is high likelihood we will need to continue to titrate over the next day or two. If she requires continued titration, would favor increasing her long-acting OxyContin rather continuing to increase dose of short-acting oxycodone. - Recommend discontinue Percocet and start scheduled Tylenol 650 mg every 6 hours.  Would then add oxycodone as rescue medication at same dosing of 5 mg every 6 hours when necessary. - Would continue to recommend addition of adjuvants as needed based on her symptoms. Currently she reports back pain and is tender to palpation in the paraspinal muscles in addition to her "deeper" back pain. Agree with initial step of changing her bed, however, could consider addition of additional adjuvant muscle relaxers if this does not help with pain related to her current bed. - Based upon her history, she would better be served by titration of opioids based upon pain indicators other than her numeric report.  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 - We will need to determine what her long-term follow-up will be for her substance abuse and pain once she is discharged as this will determine discharge regimen.  Code Status/Advance Care Planning: Full code    Code Status Orders        Start     Ordered   12/17/15 0211  Full code   Continuous  12/17/15 0217    Code Status History    Date Active Date Inactive Code Status Order ID Comments User Context   11/27/2015  9:41 PM 12/01/2015  2:13 AM Full Code [id number]  Riccardo Dubin, MD Inpatient   07/03/2013  9:43 PM 07/09/2013  4:22 AM Full Code [id number]  Etta Quill, DO ED       Other Directives:None  Symptom Management:   Pain: As above  Palliative Prophylaxis:   Bowel Regimen and Frequent Pain Assessment  Psycho-social/Spiritual:  Support System: [place] Desire for further Chaplaincy support:No Additional Recommendations: Referral to Community Resources : substance abuse  Prognosis: Unable to determine  Discharge Planning: SNF as need for 6wks of antibiotics   Chief Complaint/ Primary Diagnoses: Present on Admission:  . Subacute bacterial endocarditis ([place]) . Endocarditis . Septic pulmonary embolism ([place]) . Intravenous drug abuse, continuous . Pulmonary HTN ([place]) . Aortic valve endocarditis . Splenic infarction  I have reviewed the medical record, interviewed the patient and family, and examined the patient. The following aspects are pertinent.  Past Medical History  Diagnosis Date  . IV drug user   . MRSA (methicillin resistant Staphylococcus aureus) infection   . Abscess of skin     buttock - most recently 2009/10   Social History   Social History  . Marital Status: Single    Spouse Name: N/A  . Number of Children: N/A  . Years of Education: N/A   Social History Main Topics  . Smoking status: Current Every Day Smoker -- 1.00 packs/day    Types: Cigarettes  . Smokeless tobacco: None  . Alcohol Use: No  . Drug Use: Yes    Special: IV, Oxycodone     Comment: opiods   . Sexual Activity: Not Asked   Other Topics Concern  . None   Social History Narrative   Family History  Problem Relation Age of Onset  . Alcoholism Father    Scheduled Meds: . acetaminophen  650 mg Oral Q6H  . DAPTOmycin (CUBICIN)  IV  410 mg Intravenous Q24H  . diclofenac sodium  4 g Topical QID  . enoxaparin (LOVENOX) injection  30 mg Subcutaneous Q24H  . oxyCODONE  10 mg Oral Q12H  . polyethylene glycol  17 g Oral BID  . senna  2 tablet Oral Daily   Continuous Infusions:  PRN Meds:.diphenhydrAMINE, oxyCODONE Medications Prior to Admission:   Prior to Admission medications   Not on File   No Known Allergies  Review of Systems  Constitutional: Positive for Mild sweats and chills.  HENT: Negative for congestion and sore throat.  Eyes: Negative for blurred vision and pain.  Respiratory: Positive for shortness of breath. Negative for cough, sputum production and wheezing.  Cardiovascular: Positive for chest pain. Negative for palpitations and leg swelling.  Gastrointestinal: Positive for abdominal pain and constipation. Negative for nausea, vomiting and diarrhea.  Genitourinary: Negative for dysuria, urgency and frequency.  Musculoskeletal: Positive for back pain. Negative for falls.  Skin: Negative for itching and rash.  Neurological: Negative for dizziness, tingling, sensory change, focal weakness and headaches.   Physical Exam   General: Sleeping but arouses easily, in no acute distress. Thin.  HEENT: No bruits, no goiter, no JVD Heart: Regular rate and rhythm. Low grade SEM appreciated. Lungs: Good air movement, clear Abdomen: Soft, distended, positive bowel sounds. Mild TTP near umbilicus Ext: No significant edema. Needle markings present. Skin: Warm and dry Neuro: Grossly intact, nonfocal.  Vital Signs: BP 145/95 mmHg  Pulse 66  Temp(Src) 98 F (36.7 C) (Oral)  Resp 18  Ht 5\' 2"  (1.575 m)  Wt 50.984 kg (112 lb 6.4 oz)  BMI 20.55 kg/m2  SpO2 96%  LMP 11/11/2015  SpO2: SpO2: 96 % O2 Device:SpO2: 96 % O2 Flow Rate: .   IO: Intake/output summary:  Intake/Output Summary (Last 24 hours) at 12/18/15 1221 Last data filed at 12/17/15 1700  Gross per 24 hour  Intake    455 ml  Output   1250 ml  Net   -795 ml    LBM: Last BM Date: 12/17/15 Baseline Weight: Weight: 50.984 kg (112 lb 6.4 oz) Most recent weight: Weight: 50.984 kg (112 lb 6.4 oz)      Palliative Assessment/Data:  Flowsheet Rows        Most Recent Value   Intake Tab    Referral Department  -- [internal medicine]   Unit at Time of  Referral  Med/Surg Unit   Palliative Care Primary Diagnosis  Sepsis/Infectious Disease   Date Notified  12/17/15   Palliative Care Type  New Palliative care   Reason for referral  Pain, Clarify Goals of Care   Date of Admission  12/16/15   Date first seen by Palliative Care  12/17/15   # of days Palliative referral response time  0 Day(s)   # of days IP prior to Palliative referral  1   Clinical Assessment    Psychosocial & Spiritual Assessment    Palliative Care Outcomes       Additional Data Reviewed:  CBC:    Component Value Date/Time   WBC 3.3* 12/18/2015 0919   HGB 8.4* 12/18/2015 0919   HCT 25.0* 12/18/2015 0919   PLT 184 12/18/2015 0919   MCV 78.9 12/18/2015 0919   NEUTROABS 2.1 12/18/2015 0919   LYMPHSABS 1.0 12/18/2015 0919   MONOABS 0.2 12/18/2015 0919   EOSABS 0.0 12/18/2015 0919   BASOSABS 0.0 12/18/2015 0919   Comprehensive Metabolic Panel:    Component Value Date/Time   NA 139 12/18/2015 0919   K 3.2* 12/18/2015 0919   CL 107 12/18/2015 0919   CO2 23 12/18/2015 0919   BUN 15 12/18/2015 0919   CREATININE 1.11* 12/18/2015 0919   GLUCOSE 107* 12/18/2015 0919   CALCIUM 8.8* 12/18/2015 0919   AST 18 12/16/2015 1837   ALT 13* 12/16/2015 1837   ALKPHOS 119 12/16/2015 1837   BILITOT 0.2* 12/16/2015 1837   PROT 8.2* 12/16/2015 1837   ALBUMIN 3.0* 12/16/2015 1837     Time In: 1025 Time Out: 1125 Time Total: 60 Greater than 50%  of this time was spent counseling and coordinating care related to the above assessment and plan.  Signed by: Micheline Rough, MD  Micheline Rough, MD  12/18/2015, 12:21 PM  Please contact Palliative Medicine Team phone at [phone] for questions and concerns.

## 2015-12-19 DIAGNOSIS — F1910 Other psychoactive substance abuse, uncomplicated: Secondary | ICD-10-CM | POA: Insufficient documentation

## 2015-12-19 DIAGNOSIS — R1010 Upper abdominal pain, unspecified: Secondary | ICD-10-CM | POA: Insufficient documentation

## 2015-12-19 DIAGNOSIS — M4625 Osteomyelitis of vertebra, thoracolumbar region: Secondary | ICD-10-CM

## 2015-12-19 DIAGNOSIS — I2690 Septic pulmonary embolism without acute cor pulmonale: Secondary | ICD-10-CM

## 2015-12-19 DIAGNOSIS — Z4659 Encounter for fitting and adjustment of other gastrointestinal appliance and device: Secondary | ICD-10-CM | POA: Insufficient documentation

## 2015-12-19 DIAGNOSIS — D649 Anemia, unspecified: Secondary | ICD-10-CM | POA: Diagnosis present

## 2015-12-19 DIAGNOSIS — F1120 Opioid dependence, uncomplicated: Secondary | ICD-10-CM

## 2015-12-19 MED ORDER — OXYCODONE HCL 5 MG PO TABS
5.0000 mg | ORAL_TABLET | Freq: Four times a day (QID) | ORAL | Status: DC | PRN
  Administered 2015-12-19 – 2015-12-22 (×7): 5 mg via ORAL
  Filled 2015-12-19 (×8): qty 1

## 2015-12-19 MED ORDER — OXYCODONE HCL ER 15 MG PO T12A
15.0000 mg | EXTENDED_RELEASE_TABLET | Freq: Two times a day (BID) | ORAL | Status: AC
  Administered 2015-12-19 – 2015-12-20 (×3): 15 mg via ORAL
  Filled 2015-12-19 (×3): qty 1

## 2015-12-19 NOTE — Progress Notes (Signed)
Pt. C/o severe pain in back.  Paged [phone]. Return call from Dr. Charlynn Grimes and informed of pain.  Asked RN to get set of VS and the will be up to see pt. Will continue to monitor.  Alphonzo Lemmings, RN

## 2015-12-19 NOTE — Progress Notes (Signed)
Subjective: [name]. Patient denies fever, chills, CP, SOB, or diarrhea today.  She has a had a few sweats and continues to complain of back pain.  She had signs of withdrawal last evening and her narcotic dosing was adjusted.   Objective: Vital signs in last 24 hours: Filed Vitals:   12/18/15 1302 12/18/15 1304 12/18/15 2102 12/19/15 0511  BP: 146/105 153/109 128/88 151/101  Pulse: 69 59 72 62  Temp: 98.3 F (36.8 C)  98.9 F (37.2 C) 98.4 F (36.9 C)  TempSrc: Oral  Oral Oral  Resp: 18  18 18   Height:      Weight:      SpO2: 100%  98% 98%   Weight change:   Intake/Output Summary (Last 24 hours) at 12/19/15 0700 Last data filed at 12/19/15 0604  Gross per 24 hour  Intake    890 ml  Output      0 ml  Net    890 ml   Physical Exam  Constitutional: She is oriented to person, place, and time.  WD, WN, calm and pleasant, NAD.  HENT:  Head: Normocephalic and atraumatic.  Eyes: EOM are normal. No scleral icterus.  Neck: No JVD present. No tracheal deviation present.  Cardiovascular: Normal rate, regular rhythm and intact distal pulses.   Grade III/VI holosystolic murmur heard best at LUSB.  Pulmonary/Chest: Effort normal and breath sounds normal. No stridor. No respiratory distress. She has no wheezes.  Good air movement throughout.  Abdominal: Soft. She exhibits no distension. There is no rebound and no guarding.  Minimally tender to palpation diffusely.  Liver palpable 3 cm below costal margin.  Spleen palpable 5-6 cm below costal margin, near umbilicus.  Musculoskeletal: She exhibits no edema.  Neurological: She is alert and oriented to person, place, and time.  Skin: Skin is warm and dry.  Not diaphoretic.    Lab Results: Basic Metabolic Panel:  Recent Labs Lab 12/17/15 0550 12/18/15 0919  NA 139 139  K 3.2* 3.2*  CL 105 107  CO2 25 23  GLUCOSE 98 107*  BUN 15 15  CREATININE 1.10* 1.11*  CALCIUM 8.7* 8.8*   Liver Function Tests:  Recent Labs Lab  12/16/15 1837  AST 18  ALT 13*  ALKPHOS 119  BILITOT 0.2*  PROT 8.2*  ALBUMIN 3.0*    Recent Labs Lab 12/16/15 1837  LIPASE 52*   No results for input(s): AMMONIA in the last 168 hours. CBC:  Recent Labs Lab 12/16/15 1837 12/18/15 0919  WBC 2.7* 3.3*  NEUTROABS 1.2* 2.1  HGB 6.9* 8.4*  HCT 22.8* 25.0*  MCV 80.0 78.9  PLT 202 184   Cardiac Enzymes:  Recent Labs Lab 12/17/15 0550  CKTOTAL 15*   BNP: No results for input(s): PROBNP in the last 168 hours. D-Dimer: No results for input(s): DDIMER in the last 168 hours. CBG: No results for input(s): GLUCAP in the last 168 hours. Hemoglobin A1C: No results for input(s): HGBA1C in the last 168 hours. Fasting Lipid Panel: No results for input(s): CHOL, HDL, LDLCALC, TRIG, CHOLHDL, LDLDIRECT in the last 168 hours. Thyroid Function Tests: No results for input(s): TSH, T4TOTAL, FREET4, T3FREE, THYROIDAB in the last 168 hours. Coagulation:  Recent Labs Lab 12/16/15 2105  LABPROT 14.8  INR 1.14   Anemia Panel: No results for input(s): VITAMINB12, FOLATE, FERRITIN, TIBC, IRON, RETICCTPCT in the last 168 hours. Urine Drug Screen: Drugs of Abuse     Component Value Date/Time   LABOPIA POSITIVE* 12/16/2015 1840  COCAINSCRNUR POSITIVE* 12/16/2015 1840   LABBENZ NONE DETECTED 12/16/2015 1840   AMPHETMU NONE DETECTED 12/16/2015 1840   THCU NONE DETECTED 12/16/2015 1840   LABBARB NONE DETECTED 12/16/2015 1840    Alcohol Level: No results for input(s): ETH in the last 168 hours. Urinalysis:  Recent Labs Lab 12/16/15 [place] 1.020  PHURINE 6.0  GLUCOSEU NEGATIVE  HGBUR LARGE*  BILIRUBINUR NEGATIVE  KETONESUR NEGATIVE  PROTEINUR 100*  NITRITE NEGATIVE  LEUKOCYTESUR NEGATIVE   Misc. Labs:   Micro Results: Recent Results (from the past 240 hour(s))  Culture, blood (Routine X 2) w Reflex to ID Panel     Status: None (Preliminary result)   Collection Time: 12/16/15  6:37 PM    Result Value Ref Range Status   Specimen Description BLOOD RIGHT ANTECUBITAL  Final   Special Requests IN PEDIATRIC BOTTLE 1CC  Final   Culture NO GROWTH 2 DAYS  Final   Report Status PENDING  Incomplete  Culture, blood (Routine X 2) w Reflex to ID Panel     Status: None (Preliminary result)   Collection Time: 12/16/15  8:56 PM  Result Value Ref Range Status   Specimen Description BLOOD RIGHT HAND  Final   Special Requests IN PEDIATRIC BOTTLE 1CC  Final   Culture NO GROWTH 2 DAYS  Final   Report Status PENDING  Incomplete  MRSA PCR Screening     Status: None   Collection Time: 12/17/15 12:43 AM  Result Value Ref Range Status   MRSA by PCR NEGATIVE NEGATIVE Final    Comment:        The GeneXpert MRSA Assay (FDA approved for NASAL specimens only), is one component of a comprehensive MRSA colonization surveillance program. It is not intended to diagnose MRSA infection nor to guide or monitor treatment for MRSA infections.    Studies/Results: Ct Head Wo Contrast  12/17/2015  CLINICAL DATA:  History of endocarditis EXAM: CT HEAD WITHOUT CONTRAST TECHNIQUE: Contiguous axial images were obtained from the base of the skull through the vertex without intravenous contrast. COMPARISON:  None. FINDINGS: Bony calvarium is intact. No findings to suggest acute hemorrhage, acute infarction or space-occupying mass lesion are noted. IMPRESSION: No acute abnormality noted. Electronically Signed   By: Inez Catalina M.D.   On: 12/17/2015 10:26   Medications: I have reviewed the patient's current medications. Scheduled Meds: . acetaminophen  650 mg Oral Q6H  . DAPTOmycin (CUBICIN)  IV  500 mg Intravenous Q24H  . diclofenac sodium  4 g Topical QID  . enoxaparin (LOVENOX) injection  30 mg Subcutaneous Q24H  . oxyCODONE  20 mg Oral Q12H  . polyethylene glycol  17 g Oral BID  . senna  2 tablet Oral Daily   Continuous Infusions:  PRN Meds:. Assessment/Plan: Principal Problem:   Subacute bacterial  endocarditis (HCC) Active Problems:   Septic pulmonary embolism (HCC)   Intravenous drug abuse, continuous   Pulmonary HTN ([place])   Aortic valve endocarditis   Splenic infarction   Endocarditis  Ms. Cresswell is a 30 yo female with PMH of IV drug use, MRSA valve endocarditis, splenic infarction, and septic pulmonary emboli presenting to the hospital with a one month history of abdominal pain and back pain.   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  Repeat TTE with increased PA  pressure without evidence of vegetation. Palliative care has given recommendations for her narcotic management to prevent withdrawal. Final disposition remains problematic given her history.  We are pursuing SNF placement at this time.  Alternatively, pharmacy is helping [place] look into look acting IV medications that can be given over short infusions and do not require PICC placement (ie, Oritavancin).  However, her lack of insurance may not make this plan feasible.  - IV daptomycin. Patient will need a full 6 week course of treatment (3/1 - ?).  [ ]  Blood cultures NGTD - Palliative care consult. We appreciate your help and recommendations  Opioid Addiction: Patient reports regular use of Opana 40 mg daily for opioid addiction.  She has h/o leaving AMA in order to resume her habit prior to completion of antibiotics.  At last admission, she left while receiving Oxycontin 10 mg q12h.   - Acetaminophen 650 mg q6h scheduled - Oxycodone 20 mg q12h scheduled - OxyIR 10 mg q6h PRN - Trazodone 50 mg qHS PRN sleep  Splenomegaly with splenic infarction: Patient presenting with left-sided abdominal pain and flank pain and known subcentimeter lytic lesions in the thoracolumbar and sacral spine which are concerning for osteomyelitis. Splenic  infarct is stable.  -IV Daptomycin -Voltaren gel 4 times daily as needed  Anemia: Hemoglobin 6.9 on admission likely due to bone marrow suppression from endocarditis. Splenic sequestration of red blood cells might also be playing a role. S/p 1 U pRBC. - Transfuse Hgb < 7  Leukopenia: White count 2.7 likely due to bone marrow suppression from endocarditis and chronic IV drug use.  Acute kidney failure, resolved: Serum creatinine 1.19, improved from 1.5 on 11/27/2015.  Possibly 2/2 Opana-associated TTP or septic emboli.   - CTM  Constipation -Miralax twice daily -Senokot  DVT/PE prophylaxis: Lovenox  Diet: Regular  Dispo: Disposition is deferred at this time, awaiting improvement of current medical problems.    The patient does not have a current PCP (No Pcp Per Patient) and does need an [facility] hospital follow-up appointment after discharge.  The patient does not have transportation limitations that hinder transportation to clinic appointments.  .Services Needed at time of discharge: Y = Yes, Blank = No PT:   OT:   RN:   Equipment:   Other:     LOS: 3 days   Iline Oven, MD 12/19/2015, 7:00 AM

## 2015-12-19 NOTE — Progress Notes (Signed)
Abx Consult:  Difficult case to treat for MRSA endocarditis due to IVDA and non-compliance with therapy. One potential off label route would to use Oritavancin x1 here then use patient assistance for a second dose of dalbavancin in 3 weeks to give approximately 5-6 wks of abx for the endocarditis. The case manager is going to fax in the application form today for Dalvance but she may not get approval for due to off label indication. We may need to absorb the cost for that drug also since it'll prob be cheaper than place in a SNF.  Onnie Boer, PharmD Pager: [phone] 12/19/2015 12:54 PM

## 2015-12-19 NOTE — Progress Notes (Addendum)
CM faxed completed Patient Assistance form for Dalvance to [phone]. Representative from PAP/Dalvance called and informed CM they will not cover medication 2/2 to not having an on label diagnosis.Pharmacist and MD made aware. CM spoke to [place] /Pam @ [phone] regarding the need for Dalvance @ d/c for charity case and [facility] has agreed to to take case on and provide services (iv home infusion/medication/RN). Whitman Hero RN,BSN [phone]

## 2015-12-19 NOTE — Progress Notes (Signed)
Daily Progress Note   Patient Name: Molly [name]       Date: 12/19/2015 DOB: 04-07-1986  Age: 30 y.o. MRN#: [id number] Attending Physician: Oval Linsey, MD Primary Care Physician: No PCP Per Patient Admit Date: 12/16/2015  Reason for Consultation/Follow-up: Pain control and Psychosocial/spiritual support  Subjective: Ms. Antone is much more interactive today.  We had a long discussion regarding care plan moving forward and options of discharge to skilled facility for long-term antibiotic therapy versus possible use of newer longer acting antibiotics with plan for ministration of follow-up dose by home health.  We also discussed her pain which she reports is worse in her back today. She does appear comfortable but notes that he feels her pain will go as high as 10 out of 10.  She does report feeling much better overall since increasing dose of OxyContin to 20 mg last night. Also received 2 additional doses of 10 mg rescue oxycodone the past 24 hours.  We began discussion of long-term management of her addiction and she states that the longest she has been clean was whenever she was taking Subutex for 3 month period.  It then became too expensive and she discontinued and went back to using street drugs shortly thereafter.  Length of Stay: 3 days  Current Medications: Scheduled Meds:  . acetaminophen  650 mg Oral Q6H  . DAPTOmycin (CUBICIN)  IV  500 mg Intravenous Q24H  . diclofenac sodium  4 g Topical QID  . enoxaparin (LOVENOX) injection  30 mg Subcutaneous Q24H  . oxyCODONE  15 mg Oral Q12H  . polyethylene glycol  17 g Oral BID  . senna  2 tablet Oral Daily    Continuous Infusions:    PRN Meds: oxyCODONE, traZODone  Physical Exam: Physical Exam   General: Sitting up in bed, in no acute distress. Thin. Frequent  yawning subsided HEENT: No bruits, no goiter, no JVD. Pupils no longer dilated and reactive Heart: Regular rate and rhythm. Low grade SEM appreciated. Lungs: Good air movement, clear Abdomen: Soft, distended, positive bowel sounds. Mild TTP near umbilicus Ext: No significant edema. Needle markings present. Skin: Warm and dry. No piloerection noted Neuro: Grossly intact, nonfocal.           Vital Signs: BP 157/100 mmHg  Pulse 63  Temp(Src) 98.4 F (36.9 C) (Oral)  Resp 18  Ht 5\' 2"  (1.575 m)  Wt 50.984 kg (112 lb 6.4 oz)  BMI 20.55 kg/m2  SpO2 99%  LMP 11/11/2015 SpO2: SpO2: 99 % O2 Device: O2 Device: Not Delivered O2 Flow Rate:    Intake/output summary:  Intake/Output Summary (Last 24 hours) at 12/19/15 1708 Last data filed at 12/19/15 0604  Gross per 24 hour  Intake    550 ml  Output      0 ml  Net    550 ml   LBM: Last BM Date: 12/18/15 Baseline Weight: Weight: 50.984 kg (112 lb 6.4 oz) Most recent weight: Weight: 50.984 kg (112 lb 6.4 oz)       Palliative Assessment/Data: Flowsheet Rows        Most Recent Value   Intake Tab    Referral Department  -- [internal medicine]  Unit at Time of Referral  Med/Surg Unit   Palliative Care Primary Diagnosis  Sepsis/Infectious Disease   Date Notified  12/17/15   Palliative Care Type  New Palliative care   Reason for referral  Pain, Clarify Goals of Care   Date of Admission  12/16/15   Date first seen by Palliative Care  12/17/15   # of days Palliative referral response time  0 Day(s)   # of days IP prior to Palliative referral  1   Clinical Assessment    Psychosocial & Spiritual Assessment    Palliative Care Outcomes       Additional Data Reviewed: CBC    Component Value Date/Time   WBC 3.3* 12/18/2015 0919   RBC 3.17* 12/18/2015 0919   RBC 2.76* 11/27/2015 2247   HGB 8.4* 12/18/2015 0919   HCT 25.0* 12/18/2015 0919   PLT 184 12/18/2015 0919   MCV 78.9 12/18/2015 0919   MCH 26.5 12/18/2015 0919   MCHC 33.6  12/18/2015 0919   RDW 17.8* 12/18/2015 0919   LYMPHSABS 1.0 12/18/2015 0919   MONOABS 0.2 12/18/2015 0919   EOSABS 0.0 12/18/2015 0919   BASOSABS 0.0 12/18/2015 0919    CMP     Component Value Date/Time   NA 139 12/18/2015 0919   K 3.2* 12/18/2015 0919   CL 107 12/18/2015 0919   CO2 23 12/18/2015 0919   GLUCOSE 107* 12/18/2015 0919   BUN 15 12/18/2015 0919   CREATININE 1.11* 12/18/2015 0919   CALCIUM 8.8* 12/18/2015 0919   PROT 8.2* 12/16/2015 1837   ALBUMIN 3.0* 12/16/2015 1837   AST 18 12/16/2015 1837   ALT 13* 12/16/2015 1837   ALKPHOS 119 12/16/2015 1837   BILITOT 0.2* 12/16/2015 1837   GFRNONAA >60 12/18/2015 0919   GFRAA >60 12/18/2015 0919       Problem List:  Patient Active Problem List   Diagnosis Date Noted  . Absolute anemia   . Osteomyelitis of spine ([place])   . Endocarditis 12/17/2015  . Subacute bacterial endocarditis ([place]) 12/16/2015  . Cocaine use 11/28/2015  . 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 Pancytopenia ([place]) 11/27/2015  . AKI (acute kidney injury) ([place]) 11/27/2015  . Anasarca 11/27/2015  . Anemia, unspecified 07/04/2013  . Pulmonary HTN ([place]) 07/04/2013  . 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 Septic pulmonary embolism ([place]) 07/03/2013  . Intravenous drug abuse, continuous 07/03/2013  . MRSA bacteremia 07/03/2013     Palliative Care Assessment & Plan    1.Code Status:  Full code    Code Status Orders        Start     Ordered   12/17/15 0211  Full code   Continuous     12/17/15 0217    Code Status History    Date Active Date Inactive Code Status Order ID Comments User Context   11/27/2015  9:41 PM 12/01/2015  2:13 AM Full Code [id number]  Riccardo Dubin, MD Inpatient   07/03/2013  9:43 PM 07/09/2013  4:22 AM Full Code [id number]  Etta Quill, DO ED       2. Goals of Care/Additional Recommendations:  Plan for continued antibiotics to complete 6 week course.  Primary team, case  mgt, SW and pharmacy working together to develop best solution.  Limitations on Scope of Treatment: Full Scope Treatment  Psycho-social Needs: Referral to Community Resources : Substance abuse  3. Symptom Management:      1. Pain complicated by  active substance abuse: Will need to begin to consider discharge plan as she may be able to get Q3 week abx at home rather than discharging to SNF to complete 6 week antibiotic course.  She would be best served to have f/u with drug treatment facility/subutex or maintenance methadone clinic on discharge, but I am not sure if this can be arranged due to lack of resources.  I spoke with Dr. Lovena Le and will begin to titrate down her opioids today.  Will plan to decrease by 25% every 1-2 days.  I am concerned about ensuring we have a good plan for f/u on discharge due to there lack of resources and high risk for relapse.    4. Palliative Prophylaxis:   Aspiration, Bowel Regimen and Delirium Protocol  5. Prognosis: Unable to determine  6. Discharge Planning:  To be determined   Care plan was discussed with patient, SW, bedside nurse, and Dr. Lovena Le  Thank you for allowing the Palliative Medicine Team to assist in the care of this patient.   Time In: 1610 Time Out: 1650 Total Time 40 Prolonged Time Billed No        Micheline Rough, MD  12/19/2015, 5:08 PM  Please contact Palliative Medicine Team phone at [phone] for questions and concerns.

## 2015-12-19 NOTE — Progress Notes (Signed)
[facility]  Patient Status:   New pt for [facility] this admission  [facility] is providing the following services: HHRN and Home infusion services.  [facility] has approved pt for one time dose of dalbavancin at home as ordered at DC.  [facility] team has meet with pt and provided POC overview for home IV ABX.  Pt verbalized understanding.  If patient discharges after hours, please call [phone].   Larry Sierras 12/19/2015, 5:50 PM

## 2015-12-19 NOTE — Progress Notes (Signed)
   12/19/15 1956  Vitals  Temp 98.5 F (36.9 C)  Temp Source Oral  BP (!) 145/104 mmHg (Rn Teairra Millar notified)  MAP (mmHg) 113  BP Location Left Arm  BP Method Automatic  Patient Position (if appropriate) Sitting  Pulse Rate 77  Pulse Rate Source Monitor  Resp 18  Oxygen Therapy  SpO2 98 %  O2 Device Room Air   Text paged above to [phone].  Will continue to monitor and await for return call.  Alphonzo Lemmings, RN

## 2015-12-20 DIAGNOSIS — M4620 Osteomyelitis of vertebra, site unspecified: Secondary | ICD-10-CM

## 2015-12-20 LAB — BASIC METABOLIC PANEL
Anion gap: 9 (ref 5–15)
BUN: 19 mg/dL (ref 6–20)
CHLORIDE: 108 mmol/L (ref 101–111)
CO2: 23 mmol/L (ref 22–32)
Calcium: 8.8 mg/dL — ABNORMAL LOW (ref 8.9–10.3)
Creatinine, Ser: 1.12 mg/dL — ABNORMAL HIGH (ref 0.44–1.00)
GFR calc Af Amer: >60 mL/min (ref >60)
GFR calc non Af Amer: >60 mL/min (ref >60)
Glucose, Bld: 96 mg/dL (ref 65–99)
POTASSIUM: 3.7 mmol/L (ref 3.5–5.1)
SODIUM: 140 mmol/L (ref 135–145)

## 2015-12-20 LAB — CBC WITH DIFFERENTIAL/PLATELET
Basophils Absolute: 0 10*3/uL (ref 0.0–0.1)
Basophils Relative: 0 %
EOS PCT: 1 %
Eosinophils Absolute: 0 10*3/uL (ref 0.0–0.7)
HEMATOCRIT: 27 % — AB (ref 36.0–46.0)
Hemoglobin: 8.2 g/dL — ABNORMAL LOW (ref 12.0–15.0)
LYMPHS ABS: 0.9 10*3/uL (ref 0.7–4.0)
LYMPHS PCT: 32 %
MCH: 24.8 pg — AB (ref 26.0–34.0)
MCHC: 30.4 g/dL (ref 30.0–36.0)
MCV: 81.6 fL (ref 78.0–100.0)
Monocytes Absolute: 0.2 10*3/uL (ref 0.1–1.0)
Monocytes Relative: 6 %
NEUTROS ABS: 1.8 10*3/uL (ref 1.7–7.7)
NEUTROS PCT: 61 %
Platelets: 208 10*3/uL (ref 150–400)
RBC: 3.31 MIL/uL — AB (ref 3.87–5.11)
RDW: 18.2 % — ABNORMAL HIGH (ref 11.5–15.5)
WBC: 2.9 10*3/uL — AB (ref 4.0–10.5)

## 2015-12-20 MED ORDER — OXYCODONE HCL ER 10 MG PO T12A
10.0000 mg | EXTENDED_RELEASE_TABLET | Freq: Two times a day (BID) | ORAL | Status: DC
  Administered 2015-12-21 – 2015-12-22 (×3): 10 mg via ORAL
  Filled 2015-12-20 (×3): qty 1

## 2015-12-20 MED ORDER — OXYCODONE HCL 5 MG PO TABS
5.0000 mg | ORAL_TABLET | Freq: Once | ORAL | Status: AC
  Administered 2015-12-20: 5 mg via ORAL

## 2015-12-20 NOTE — Progress Notes (Signed)
Subjective: [name]. Patient complaining of continued back pain last night, but believes it was due to constipation and not receiving Miralax yesterday.   No signs of withdrawal at that time.  Today she is happy about being able to get home infusions of antibiotics.  When she is discharged, she states she needs to go to [facility] and go to meetings in order to occupy her time.    Objective: Vital signs in last 24 hours: Filed Vitals:   12/19/15 0511 12/19/15 1515 12/19/15 1956 12/20/15 0536  BP: 151/101 157/100 145/104 156/102  Pulse: 62 63 77 59  Temp: 98.4 F (36.9 C) 98.4 F (36.9 C) 98.5 F (36.9 C) 98.1 F (36.7 C)  TempSrc: Oral Oral Oral Oral  Resp: 18 18 18 18   Height:      Weight:      SpO2: 98% 99% 98% 98%   Weight change:   Intake/Output Summary (Last 24 hours) at 12/20/15 [id number] Last data filed at 12/20/15 [id number]  Gross per 24 hour  Intake    850 ml  Output      0 ml  Net    850 ml   Physical Exam  Constitutional: She is oriented to person, place, and time.  WD, WN, calm and pleasant, NAD.  HENT:  Head: Normocephalic and atraumatic.  Eyes: EOM are normal. No scleral icterus.  Neck: No JVD present. No tracheal deviation present.  Cardiovascular: Normal rate, regular rhythm and intact distal pulses.   Grade III/VI holosystolic murmur heard best at LUSB.  Pulmonary/Chest: Effort normal and breath sounds normal. No stridor. No respiratory distress. She has no wheezes.  Good air movement throughout.  Abdominal: Soft. She exhibits no distension. There is no rebound and no guarding.  Minimally tender to palpation diffusely.  Liver palpable 3 cm below costal margin.  Spleen palpable 5-6 cm below costal margin, near umbilicus.  Musculoskeletal: She exhibits no edema.  Neurological: She is alert and oriented to person, place, and time.  Skin: Skin is warm and dry.  Not diaphoretic.    Lab Results: Basic Metabolic Panel:  Recent Labs Lab 12/17/15 0550  12/18/15 0919  NA 139 139  K 3.2* 3.2*  CL 105 107  CO2 25 23  GLUCOSE 98 107*  BUN 15 15  CREATININE 1.10* 1.11*  CALCIUM 8.7* 8.8*   Liver Function Tests:  Recent Labs Lab 12/16/15 1837  AST 18  ALT 13*  ALKPHOS 119  BILITOT 0.2*  PROT 8.2*  ALBUMIN 3.0*    Recent Labs Lab 12/16/15 1837  LIPASE 52*   No results for input(s): AMMONIA in the last 168 hours. CBC:  Recent Labs Lab 12/16/15 1837 12/18/15 0919  WBC 2.7* 3.3*  NEUTROABS 1.2* 2.1  HGB 6.9* 8.4*  HCT 22.8* 25.0*  MCV 80.0 78.9  PLT 202 184   Cardiac Enzymes:  Recent Labs Lab 12/17/15 0550  CKTOTAL 15*   BNP: No results for input(s): PROBNP in the last 168 hours. D-Dimer: No results for input(s): DDIMER in the last 168 hours. CBG: No results for input(s): GLUCAP in the last 168 hours. Hemoglobin A1C: No results for input(s): HGBA1C in the last 168 hours. Fasting Lipid Panel: No results for input(s): CHOL, HDL, LDLCALC, TRIG, CHOLHDL, LDLDIRECT in the last 168 hours. Thyroid Function Tests: No results for input(s): TSH, T4TOTAL, FREET4, T3FREE, THYROIDAB in the last 168 hours. Coagulation:  Recent Labs Lab 12/16/15 2105  LABPROT 14.8  INR 1.14   Anemia Panel: No  results for input(s): VITAMINB12, FOLATE, FERRITIN, TIBC, IRON, RETICCTPCT in the last 168 hours. Urine Drug Screen: Drugs of Abuse     Component Value Date/Time   LABOPIA POSITIVE* 12/16/2015 1840   COCAINSCRNUR POSITIVE* 12/16/2015 1840   LABBENZ NONE DETECTED 12/16/2015 1840   AMPHETMU NONE DETECTED 12/16/2015 1840   THCU NONE DETECTED 12/16/2015 1840   LABBARB NONE DETECTED 12/16/2015 1840    Alcohol Level: No results for input(s): ETH in the last 168 hours. Urinalysis:  Recent Labs Lab 12/16/15 [place] 1.020  PHURINE 6.0  GLUCOSEU NEGATIVE  HGBUR LARGE*  BILIRUBINUR NEGATIVE  KETONESUR NEGATIVE  PROTEINUR 100*  NITRITE NEGATIVE  LEUKOCYTESUR NEGATIVE   Misc.  Labs:   Micro Results: Recent Results (from the past 240 hour(s))  Culture, blood (Routine X 2) w Reflex to ID Panel     Status: None (Preliminary result)   Collection Time: 12/16/15  6:37 PM  Result Value Ref Range Status   Specimen Description BLOOD RIGHT ANTECUBITAL  Final   Special Requests IN PEDIATRIC BOTTLE 1CC  Final   Culture NO GROWTH 3 DAYS  Final   Report Status PENDING  Incomplete  Culture, blood (Routine X 2) w Reflex to ID Panel     Status: None (Preliminary result)   Collection Time: 12/16/15  8:56 PM  Result Value Ref Range Status   Specimen Description BLOOD RIGHT HAND  Final   Special Requests IN PEDIATRIC BOTTLE 1CC  Final   Culture NO GROWTH 3 DAYS  Final   Report Status PENDING  Incomplete  MRSA PCR Screening     Status: None   Collection Time: 12/17/15 12:43 AM  Result Value Ref Range Status   MRSA by PCR NEGATIVE NEGATIVE Final    Comment:        The GeneXpert MRSA Assay (FDA approved for NASAL specimens only), is one component of a comprehensive MRSA colonization surveillance program. It is not intended to diagnose MRSA infection nor to guide or monitor treatment for MRSA infections.    Studies/Results: No results found. Medications: I have reviewed the patient's current medications. Scheduled Meds: . acetaminophen  650 mg Oral Q6H  . DAPTOmycin (CUBICIN)  IV  500 mg Intravenous Q24H  . diclofenac sodium  4 g Topical QID  . enoxaparin (LOVENOX) injection  30 mg Subcutaneous Q24H  . oxyCODONE  15 mg Oral Q12H  . polyethylene glycol  17 g Oral BID  . senna  2 tablet Oral Daily   Continuous Infusions:  PRN Meds:. Assessment/Plan: Principal Problem:   Subacute bacterial endocarditis (HCC) Active Problems:   Septic pulmonary embolism (HCC)   Intravenous drug abuse, continuous   Pulmonary HTN (HCC)   Aortic valve endocarditis   Splenic infarction   Endocarditis   Absolute anemia   Osteomyelitis of spine (HCC)   Pain of upper abdomen    Polysubstance abuse   Palliative care encounter  Ms. Budhram is a 30 yo female with PMH of IV drug use, MRSA valve endocarditis, splenic infarction, and septic pulmonary emboli presenting to the hospital with a one month history of abdominal pain and back pain.   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  Repeat TTE with increased PA pressure without evidence of vegetation. Plan for disposition is now to provide single dose of Oritavancin while  inpatient.  She will then receive home health infusion of Dalbavancin, generously provided by [facility].  Pharmacy has stated this as an off label use of the antibiotics, but it still appears in the best interest of the patient in order to prevent abuse through and infection of the PICC line.  In addition, given her history of leaving AMA, long acting drugs are the best option for completing the course of treatment.  Patient will remain admitted through the weekend in order to continue opioid wean and arrange safe disposition to prevent Opana relapse and repeat infection.  - IV daptomycin (3/1 - ?) - Oritavancin 1200 mg IV once on 3/6 - Dalbavancin1500 mg IV once on 3/27  [ ]  Blood cultures NGTD - Palliative care consult. We appreciate your help and recommendations  Opioid Addiction: Patient reports regular use of Opana 40 mg daily for opioid addiction.  She has h/o leaving AMA in order to resume her habit prior to completion of antibiotics.  At last admission, she left while receiving Oxycontin 10 mg q12h.  Palliative care has given recommendations for her narcotic management to prevent withdrawal.  We appreciate their help in the weaning process, weaning ~25% every 48 hours.   - Acetaminophen 650 mg q6h scheduled - Oxycodone 15 mg q12h scheduled - OxyIR  5 mg q6h PRN - Trazodone 50 mg qHS PRN sleep - Social work consult for rehab options including Methadone/Suboxone clinics  Splenomegaly with splenic infarction: Patient presenting with left-sided abdominal pain and flank pain and known subcentimeter lytic lesions in the thoracolumbar and sacral spine which are concerning for osteomyelitis. Splenic infarct is stable.  -IV Daptomycin -Voltaren gel 4 times daily as needed  Anemia: Hemoglobin 6.9 on admission likely due to bone marrow suppression from endocarditis. Splenic sequestration of red blood cells might also be playing a role. S/p 1 U pRBC. - Transfuse Hgb < 7  Leukopenia: White count 2.7 likely due to bone marrow suppression from endocarditis and chronic IV drug use.  Acute kidney failure, resolved: Serum creatinine 1.19, improved from 1.5 on 11/27/2015.  Possibly 2/2 Opana-associated TTP or septic emboli.   - CTM  Constipation -Miralax twice daily -Senokot  DVT/PE prophylaxis: Lovenox  Diet: Regular  Dispo: Disposition is deferred at this time, awaiting improvement of current medical problems.    The patient does not have a current PCP (No Pcp Per Patient) and does need an [facility] hospital follow-up appointment after discharge.  The patient does not have transportation limitations that hinder transportation to clinic appointments.  .Services Needed at time of discharge: Y = Yes, Blank = No PT:   OT:   RN:   Equipment:   Other:     LOS: 4 days   Iline Oven, MD 12/20/2015, 7:02 AM

## 2015-12-20 NOTE — Progress Notes (Signed)
Daily Progress Note   Patient Name: Molly [name]       Date: 12/20/2015 DOB: [birth year]  Age: 30 y.o. MRN#: [id number] Attending Physician: Oval Linsey, MD Primary Care Physician: No PCP Per Patient Admit Date: 12/16/2015  Reason for Consultation/Follow-up: Pain control and Psychosocial/spiritual support  Subjective: Ms. Madon is less interactive today.  She is happy about having option for antibiotics course at home rather than skilled facility.  She acknowledges need to have plan for substance abuse on discharge.  She reports being agreeable to continued weaning of her opioids.  Length of Stay: 4 days  Current Medications: Scheduled Meds:  . acetaminophen  650 mg Oral Q6H  . DAPTOmycin (CUBICIN)  IV  500 mg Intravenous Q24H  . diclofenac sodium  4 g Topical QID  . enoxaparin (LOVENOX) injection  30 mg Subcutaneous Q24H  . oxyCODONE  15 mg Oral Q12H  . polyethylene glycol  17 g Oral BID  . senna  2 tablet Oral Daily    Continuous Infusions:    PRN Meds: oxyCODONE, traZODone  Physical Exam: Physical Exam   General: Lying in bed, in no acute distress. Thin.  HEENT: No bruits, no goiter, no JVD. Pupils reactive Heart: Regular rate and rhythm. Low grade SEM appreciated. Lungs: Good air movement, clear Abdomen: Soft, distended, positive bowel sounds. Mild TTP near umbilicus Ext: No significant edema. Needle markings present. Skin: Warm and dry. No piloerection noted Neuro: Grossly intact, nonfocal.           Vital Signs: BP 156/102 mmHg  Pulse 59  Temp(Src) 98.1 F (36.7 C) (Oral)  Resp 18  Ht 5\' 2"  (1.575 m)  Wt 50.984 kg (112 lb 6.4 oz)  BMI 20.55 kg/m2  SpO2 98%  LMP 11/11/2015 SpO2: SpO2: 98 % O2 Device: O2 Device: Not Delivered O2 Flow Rate:    Intake/output summary:   Intake/Output  Summary (Last 24 hours) at 12/20/15 1126 Last data filed at 12/20/15 [id number]  Gross per 24 hour  Intake    850 ml  Output      0 ml  Net    850 ml   LBM: Last BM Date: 12/18/15 Baseline Weight: Weight: 50.984 kg (112 lb 6.4 oz) Most recent weight: Weight: 50.984 kg (112 lb 6.4 oz)       Palliative Assessment/Data: Flowsheet Rows        Most Recent Value   Intake Tab    Referral Department  -- [internal medicine]   Unit at Time of Referral  Med/Surg Unit   Palliative Care Primary Diagnosis  Sepsis/Infectious Disease   Date Notified  12/17/15   Palliative Care Type  New Palliative care   Reason for referral  Pain, Clarify Goals of Care   Date of Admission  12/16/15   Date first seen by Palliative Care  12/17/15   # of days Palliative referral response time  0 Day(s)   # of days IP prior to Palliative referral  1   Clinical Assessment    Psychosocial & Spiritual Assessment    Palliative Care Outcomes       Additional Data Reviewed: CBC    Component Value Date/Time   WBC 2.9* 12/20/2015  0537   RBC 3.31* 12/20/2015 0537   RBC 2.76* 11/27/2015 2247   HGB 8.2* 12/20/2015 0537   HCT 27.0* 12/20/2015 0537   PLT 208 12/20/2015 0537   MCV 81.6 12/20/2015 0537   MCH 24.8* 12/20/2015 0537   MCHC 30.4 12/20/2015 0537   RDW 18.2* 12/20/2015 0537   LYMPHSABS 0.9 12/20/2015 0537   MONOABS 0.2 12/20/2015 0537   EOSABS 0.0 12/20/2015 0537   BASOSABS 0.0 12/20/2015 0537    CMP     Component Value Date/Time   NA 140 12/20/2015 0537   K 3.7 12/20/2015 0537   CL 108 12/20/2015 0537   CO2 23 12/20/2015 0537   GLUCOSE 96 12/20/2015 0537   BUN 19 12/20/2015 0537   CREATININE 1.12* 12/20/2015 0537   CALCIUM 8.8* 12/20/2015 0537   PROT 8.2* 12/16/2015 1837   ALBUMIN 3.0* 12/16/2015 1837   AST 18 12/16/2015 1837   ALT 13* 12/16/2015 1837   ALKPHOS 119 12/16/2015 1837   BILITOT 0.2* 12/16/2015 1837   GFRNONAA >60 12/20/2015 0537   GFRAA >60 12/20/2015 0537       Problem  List:  Patient Active Problem List   Diagnosis Date Noted  . Absolute anemia   . Osteomyelitis of spine ([place])   . Pain of upper abdomen   . Polysubstance abuse   . Palliative care encounter   . Endocarditis 12/17/2015  . Subacute bacterial endocarditis ([place]) 12/16/2015  . Cocaine use 11/28/2015  . 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 Pancytopenia ([place]) 11/27/2015  . AKI (acute kidney injury) ([place]) 11/27/2015  . Anasarca 11/27/2015  . Anemia, unspecified 07/04/2013  . Pulmonary HTN ([place]) 07/04/2013  . 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 Septic pulmonary embolism ([place]) 07/03/2013  . Intravenous drug abuse, continuous 07/03/2013  . MRSA bacteremia 07/03/2013     Palliative Care Assessment & Plan    1.Code Status:  Full code    Code Status Orders        Start     Ordered   12/17/15 0211  Full code   Continuous     12/17/15 0217    Code Status History    Date Active Date Inactive Code Status Order ID Comments User Context   11/27/2015  9:41 PM 12/01/2015  2:13 AM Full Code [id number]  Riccardo Dubin, MD Inpatient   07/03/2013  9:43 PM 07/09/2013  4:22 AM Full Code [id number]  Etta Quill, DO ED       2. Goals of Care/Additional Recommendations:  Plan to complete 6 week course of abx.  Primary team, case mgt, SW and pharmacy working together to develop best solution.   Limitations on Scope of Treatment: Full Scope Treatment  Psycho-social Needs: Referral to Community Resources : Substance abuse  3. Symptom Management:      1. Pain complicated by active substance abuse:  She will be best served to have f/u with drug treatment facility/subutex or maintenance methadone clinic on discharge.  Continue to titrate down her opioids.  Will plan to decrease by 25% every 1-2 days.  Plan to decrease Oxycontin to 10mg  beginning tomorrow AM.  I remain concerned about ensuring we have a good plan for f/u on discharge due to there lack of  resources and high risk for relapse.  Appreciate SW assistance in determining resources.   4. Palliative Prophylaxis:   Aspiration, Bowel Regimen and Delirium Protocol  5. Prognosis: Unable to determine  6. Discharge  Planning:  To be determined   Care plan was discussed with patient  Thank you for allowing the Palliative Medicine Team to assist in the care of this patient.   Time In: 1040 Time Out: 1110 Total Time 30 Prolonged Time Billed No        Micheline Rough, MD  12/20/2015, 11:26 AM  Please contact Palliative Medicine Team phone at [phone] for questions and concerns.

## 2015-12-20 NOTE — Progress Notes (Signed)
Patient was not in her room and day staff called saying they saw her outside smoking a cigarette. I went outside to look for her and called security to start searching the hospital. A bystander said he saw her go back inside about 10 minutes prior. I returned to the unit and found the patient in her room. At this point, the charge nurse and I informed patient that this behavior is not acceptable and that she is not allowed to leave the unit. Patient has agreed not to leave the unit again. Will continue to monitor.

## 2015-12-20 NOTE — Progress Notes (Signed)
Patient vomited after drinking prune juice approximately 10 minutes following receiving pain medication. On-call MD notified and orders placed. Will continue to monitor.

## 2015-12-21 DIAGNOSIS — K5900 Constipation, unspecified: Secondary | ICD-10-CM | POA: Diagnosis present

## 2015-12-21 LAB — TYPE AND SCREEN
ABO/RH(D): O NEG
Antibody Screen: POSITIVE
DAT, IgG: NEGATIVE
UNIT DIVISION: 0
Unit division: 0

## 2015-12-21 LAB — CULTURE, BLOOD (ROUTINE X 2)
CULTURE: NO GROWTH
Culture: NO GROWTH

## 2015-12-21 MED ORDER — ORITAVANCIN DIPHOSPHATE 400 MG IV SOLR
1200.0000 mg | Freq: Once | INTRAVENOUS | Status: DC
  Filled 2015-12-21: qty 120

## 2015-12-21 MED ORDER — OXYCODONE HCL ER 15 MG PO T12A
15.0000 mg | EXTENDED_RELEASE_TABLET | Freq: Once | ORAL | Status: AC
  Administered 2015-12-21: 15 mg via ORAL
  Filled 2015-12-21: qty 1

## 2015-12-21 MED ORDER — MAGNESIUM CITRATE PO SOLN
1.0000 | Freq: Once | ORAL | Status: AC
  Administered 2015-12-21: 1 via ORAL
  Filled 2015-12-21: qty 296

## 2015-12-21 MED ORDER — SENNA 8.6 MG PO TABS
2.0000 | ORAL_TABLET | Freq: Two times a day (BID) | ORAL | Status: DC
  Administered 2015-12-21 – 2015-12-22 (×3): 17.2 mg via ORAL
  Filled 2015-12-21 (×3): qty 2

## 2015-12-21 NOTE — Progress Notes (Signed)
Daily Progress Note   Patient Name: Molly [name]       Date: 12/21/2015 DOB: Jan 26, 1986  Age: 30 y.o. MRN#: [id number] Attending Physician: Oval Linsey, MD Primary Care Physician: No PCP Per Patient Admit Date: 12/16/2015  Reason for Consultation/Follow-up: Pain control and Psychosocial/spiritual support  Subjective: Ms. Molly [name] is lying in chair with eyes closed today. She does not interact with me today.  She does not open eyes and nods "no" to any needs.  Length of Stay: 5 days  Current Medications: Scheduled Meds:  . acetaminophen  650 mg Oral Q6H  . DAPTOmycin (CUBICIN)  IV  500 mg Intravenous Q24H  . diclofenac sodium  4 g Topical QID  . enoxaparin (LOVENOX) injection  30 mg Subcutaneous Q24H  . magnesium citrate  1 Bottle Oral Once  . [START ON 12/22/2015] oritavancin (ORBACTIV) IVPB  1,200 mg Intravenous Once  . oxyCODONE  10 mg Oral Q12H  . polyethylene glycol  17 g Oral BID  . senna  2 tablet Oral BID    Continuous Infusions:    PRN Meds: oxyCODONE, traZODone  Physical Exam: Physical Exam   General: Lying in chair, in no acute distress. Thin.  Heart: Regular rate and rhythm. Low grade SEM appreciated. Lungs: Good air movement, clear Abdomen: Soft, distended, positive bowel sounds. Ext: No significant edema. Needle markings present. Skin: Warm and dry. No piloerection noted         Vital Signs: BP 133/98 mmHg  Pulse 78  Temp(Src) 98.4 F (36.9 C) (Oral)  Resp 16  Ht 5\' 2"  (1.575 m)  Wt 50.984 kg (112 lb 6.4 oz)  BMI 20.55 kg/m2  SpO2 97%  LMP 11/11/2015 SpO2: SpO2: 97 % O2 Device: O2 Device: Not Delivered O2 Flow Rate:    Intake/output summary:   Intake/Output Summary (Last 24 hours) at 12/21/15 1159 Last data filed at 12/21/15 0400  Gross per 24 hour  Intake    648 ml  Output       0 ml  Net    648 ml   LBM: Last BM Date: 12/18/15 Baseline Weight: Weight: 50.984 kg (112 lb 6.4 oz) Most recent weight: Weight: 50.984 kg (112 lb 6.4 oz)       Palliative Assessment/Data: Flowsheet Rows        Most Recent Value   Intake Tab    Referral Department  -- [internal medicine]   Unit at Time of Referral  Med/Surg Unit   Palliative Care Primary Diagnosis  Sepsis/Infectious Disease   Date Notified  12/17/15   Palliative Care Type  New Palliative care   Reason for referral  Pain, Clarify Goals of Care   Date of Admission  12/16/15   Date first seen by Palliative Care  12/17/15   # of days Palliative referral response time  0 Day(s)   # of days IP prior to Palliative referral  1   Clinical Assessment    Psychosocial & Spiritual Assessment    Palliative Care Outcomes       Additional Data Reviewed: CBC    Component Value Date/Time   WBC 2.9* 12/20/2015 0537   RBC 3.31* 12/20/2015 0537   RBC 2.76* 11/27/2015 2247  HGB 8.2* 12/20/2015 0537   HCT 27.0* 12/20/2015 0537   PLT 208 12/20/2015 0537   MCV 81.6 12/20/2015 0537   MCH 24.8* 12/20/2015 0537   MCHC 30.4 12/20/2015 0537   RDW 18.2* 12/20/2015 0537   LYMPHSABS 0.9 12/20/2015 0537   MONOABS 0.2 12/20/2015 0537   EOSABS 0.0 12/20/2015 0537   BASOSABS 0.0 12/20/2015 0537    CMP     Component Value Date/Time   NA 140 12/20/2015 0537   K 3.7 12/20/2015 0537   CL 108 12/20/2015 0537   CO2 23 12/20/2015 0537   GLUCOSE 96 12/20/2015 0537   BUN 19 12/20/2015 0537   CREATININE 1.12* 12/20/2015 0537   CALCIUM 8.8* 12/20/2015 0537   PROT 8.2* 12/16/2015 1837   ALBUMIN 3.0* 12/16/2015 1837   AST 18 12/16/2015 1837   ALT 13* 12/16/2015 1837   ALKPHOS 119 12/16/2015 1837   BILITOT 0.2* 12/16/2015 1837   GFRNONAA >60 12/20/2015 0537   GFRAA >60 12/20/2015 0537       Problem List:  Patient Active Problem List   Diagnosis Date Noted  . Constipation   . Absolute anemia   . Osteomyelitis of spine ([place])    . Pain of upper abdomen   . Polysubstance abuse   . Palliative care encounter   . Endocarditis 12/17/2015  . Subacute bacterial endocarditis ([place]) 12/16/2015  . Cocaine use 11/28/2015  . 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 Pancytopenia ([place]) 11/27/2015  . AKI (acute kidney injury) ([place]) 11/27/2015  . Anasarca 11/27/2015  . Anemia, unspecified 07/04/2013  . Pulmonary HTN ([place]) 07/04/2013  . 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 Septic pulmonary embolism ([place]) 07/03/2013  . Intravenous drug abuse, continuous 07/03/2013  . MRSA bacteremia 07/03/2013     Palliative Care Assessment & Plan    1.Code Status:  Full code    Code Status Orders        Start     Ordered   12/17/15 0211  Full code   Continuous     12/17/15 0217    Code Status History    Date Active Date Inactive Code Status Order ID Comments User Context   11/27/2015  9:41 PM 12/01/2015  2:13 AM Full Code [id number]  Riccardo Dubin, MD Inpatient   07/03/2013  9:43 PM 07/09/2013  4:22 AM Full Code [id number]  Etta Quill, DO ED       2. Goals of Care/Additional Recommendations:  Plan to complete 6 week course of abx as OP.  Family has been looking into suboxone clinic on discharge.  Limitations on Scope of Treatment: Full Scope Treatment  Psycho-social Needs: Referral to Community Resources : Substance abuse  3. Symptom Management:      1. Pain complicated by active substance abuse:  She will be best served to have f/u with drug treatment facility/subutex or maintenance methadone clinic on discharge.  Continue to titrate down her opioids.  Will plan to decrease by 25% every 1-2 days.  Currently on Oxycontin 10mg  BID with 5mg  breakthrough Q6H.  Will d/c short acting tomorrow AM.  I remain concerned about ensuring we have a good plan for f/u on discharge due to there lack of resources and high risk for relapse.  Appreciate SW assistance in determining resources.    4. Palliative Prophylaxis:   Aspiration, Bowel Regimen and Delirium Protocol  5. Prognosis: Unable to determine  6. Discharge Planning:  To be determined  Care plan was discussed with patient  Thank you for allowing the Palliative Medicine Team to assist in the care of this patient.   Time In: 1145 Time Out: 1200 Total Time 15 Prolonged Time Billed No        Micheline Rough, MD  12/21/2015, 11:59 AM  Please contact Palliative Medicine Team phone at [phone] for questions and concerns.

## 2015-12-21 NOTE — Progress Notes (Signed)
Subjective: [name]. Patient was found to have walked outside in order to smoke a cigarette. She states her back pain was so bad that she just needed to walk it off.  Her back pain was worse overnight as well, which she believes is due to not having a BM in the last 3 days.  She denies need for nicotine patch.  Patient's mom is helping to find a Suboxone clinic for the patient at discharge, but she is concerned about the cost of $500/month.  Objective: Vital signs in last 24 hours: Filed Vitals:   12/20/15 0536 12/20/15 1446 12/20/15 2121 12/21/15 0630  BP: 156/102 146/99 155/99 133/98  Pulse: 59 84 73 78  Temp: 98.1 F (36.7 C) 98.4 F (36.9 C) 98.4 F (36.9 C) 98.4 F (36.9 C)  TempSrc: Oral Oral Oral Oral  Resp: 18 16 16 16   Height:      Weight:      SpO2: 98% 98% 100% 97%   Weight change:   Intake/Output Summary (Last 24 hours) at 12/21/15 0944 Last data filed at 12/21/15 0400  Gross per 24 hour  Intake    868 ml  Output      0 ml  Net    868 ml   Physical Exam  Constitutional: She is oriented to person, place, and time.  WD, WN, calm and pleasant, NAD.  HENT:  Head: Normocephalic and atraumatic.  Eyes: EOM are normal. No scleral icterus.  Neck: No JVD present. No tracheal deviation present.  Cardiovascular: Normal rate, regular rhythm and intact distal pulses.   Grade III/VI holosystolic murmur heard best at LUSB.  Pulmonary/Chest: Effort normal and breath sounds normal. No stridor. No respiratory distress. She has no wheezes.  Good air movement throughout.  Abdominal: Soft. She exhibits no distension. There is no rebound and no guarding.  Minimally tender to palpation diffusely.  Liver palpable 3 cm below costal margin.  Spleen palpable 5-6 cm below costal margin, near umbilicus.  Musculoskeletal: She exhibits no edema.  Neurological: She is alert and oriented to person, place, and time.  Skin: Skin is warm and dry.  Not diaphoretic.    Lab Results: Basic  Metabolic Panel:  Recent Labs Lab 12/18/15 0919 12/20/15 0537  NA 139 140  K 3.2* 3.7  CL 107 108  CO2 23 23  GLUCOSE 107* 96  BUN 15 19  CREATININE 1.11* 1.12*  CALCIUM 8.8* 8.8*   Liver Function Tests:  Recent Labs Lab 12/16/15 1837  AST 18  ALT 13*  ALKPHOS 119  BILITOT 0.2*  PROT 8.2*  ALBUMIN 3.0*    Recent Labs Lab 12/16/15 1837  LIPASE 52*   No results for input(s): AMMONIA in the last 168 hours. CBC:  Recent Labs Lab 12/18/15 0919 12/20/15 0537  WBC 3.3* 2.9*  NEUTROABS 2.1 1.8  HGB 8.4* 8.2*  HCT 25.0* 27.0*  MCV 78.9 81.6  PLT 184 208   Cardiac Enzymes:  Recent Labs Lab 12/17/15 0550  CKTOTAL 15*   BNP: No results for input(s): PROBNP in the last 168 hours. D-Dimer: No results for input(s): DDIMER in the last 168 hours. CBG: No results for input(s): GLUCAP in the last 168 hours. Hemoglobin A1C: No results for input(s): HGBA1C in the last 168 hours. Fasting Lipid Panel: No results for input(s): CHOL, HDL, LDLCALC, TRIG, CHOLHDL, LDLDIRECT in the last 168 hours. Thyroid Function Tests: No results for input(s): TSH, T4TOTAL, FREET4, T3FREE, THYROIDAB in the last 168 hours. Coagulation:  Recent  Labs Lab 12/16/15 2105  LABPROT 14.8  INR 1.14   Anemia Panel: No results for input(s): VITAMINB12, FOLATE, FERRITIN, TIBC, IRON, RETICCTPCT in the last 168 hours. Urine Drug Screen: Drugs of Abuse     Component Value Date/Time   LABOPIA POSITIVE* 12/16/2015 1840   COCAINSCRNUR POSITIVE* 12/16/2015 1840   LABBENZ NONE DETECTED 12/16/2015 1840   AMPHETMU NONE DETECTED 12/16/2015 1840   THCU NONE DETECTED 12/16/2015 1840   LABBARB NONE DETECTED 12/16/2015 1840    Alcohol Level: No results for input(s): ETH in the last 168 hours. Urinalysis:  Recent Labs Lab 12/16/15 [place] 1.020  PHURINE 6.0  GLUCOSEU NEGATIVE  HGBUR LARGE*  BILIRUBINUR NEGATIVE  KETONESUR NEGATIVE  PROTEINUR 100*  NITRITE  NEGATIVE  LEUKOCYTESUR NEGATIVE   Misc. Labs:   Micro Results: Recent Results (from the past 240 hour(s))  Culture, blood (Routine X 2) w Reflex to ID Panel     Status: None (Preliminary result)   Collection Time: 12/16/15  6:37 PM  Result Value Ref Range Status   Specimen Description BLOOD RIGHT ANTECUBITAL  Final   Special Requests IN PEDIATRIC BOTTLE 1CC  Final   Culture NO GROWTH 4 DAYS  Final   Report Status PENDING  Incomplete  Culture, blood (Routine X 2) w Reflex to ID Panel     Status: None (Preliminary result)   Collection Time: 12/16/15  8:56 PM  Result Value Ref Range Status   Specimen Description BLOOD RIGHT HAND  Final   Special Requests IN PEDIATRIC BOTTLE 1CC  Final   Culture NO GROWTH 4 DAYS  Final   Report Status PENDING  Incomplete  MRSA PCR Screening     Status: None   Collection Time: 12/17/15 12:43 AM  Result Value Ref Range Status   MRSA by PCR NEGATIVE NEGATIVE Final    Comment:        The GeneXpert MRSA Assay (FDA approved for NASAL specimens only), is one component of a comprehensive MRSA colonization surveillance program. It is not intended to diagnose MRSA infection nor to guide or monitor treatment for MRSA infections.    Studies/Results: No results found. Medications: I have reviewed the patient's current medications. Scheduled Meds: . acetaminophen  650 mg Oral Q6H  . DAPTOmycin (CUBICIN)  IV  500 mg Intravenous Q24H  . diclofenac sodium  4 g Topical QID  . enoxaparin (LOVENOX) injection  30 mg Subcutaneous Q24H  . magnesium citrate  1 Bottle Oral Once  . oxyCODONE  10 mg Oral Q12H  . polyethylene glycol  17 g Oral BID  . senna  2 tablet Oral BID   Continuous Infusions:  PRN Meds:. Assessment/Plan: Principal Problem:   Subacute bacterial endocarditis (HCC) Active Problems:   Septic pulmonary embolism (HCC)   Intravenous drug abuse, continuous   Pulmonary HTN (HCC)   Aortic valve endocarditis   Splenic infarction    Endocarditis   Absolute anemia   Osteomyelitis of spine (HCC)   Pain of upper abdomen   Polysubstance abuse   Palliative care encounter  Ms. Grgas is a 30 yo female with PMH of IV drug use, MRSA valve endocarditis, splenic infarction, and septic pulmonary emboli presenting to the hospital with a one month history of abdominal pain and back pain.   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  Repeat TTE with increased PA pressure without evidence of vegetation. Plan for disposition is now to provide single dose of Oritavancin while inpatient.  She will then receive home health infusion of Dalbavancin, generously provided by [facility].  Pharmacy has stated this as an off label use of the antibiotics, but it still appears in the best interest of the patient in order to prevent abuse through and infection of the PICC line.  In addition, given her history of leaving AMA, long acting drugs are the best option for completing the course of treatment.  Patient will remain admitted through the weekend in order to continue opioid wean and arrange safe disposition to prevent Opana relapse and repeat infection.  - IV daptomycin (3/1 - ?) - Oritavancin 1200 mg IV once on 3/6 - Dalbavancin1500 mg IV once on 3/27  [ ]  Blood cultures NGTD - Palliative care consult. We appreciate your help and recommendations  Opioid Addiction: Patient reports regular use of Opana 40 mg daily for opioid addiction.  She has h/o leaving AMA in order to resume her habit prior to completion of antibiotics.  At last admission, she left while receiving Oxycontin 10 mg q12h.  Palliative care has given recommendations for her narcotic management to prevent withdrawal.  We appreciate their help in the weaning process, weaning ~25% every  48 hours.  She hopes to enter a suboxone clinic on discharge, which her mom is trying to help arrange.  However, she is concerned about the price. We appreciate social work's help in arranging affordable rehab. - Acetaminophen 650 mg q6h scheduled - Oxycodone 10 mg q12h scheduled - OxyIR 5 mg q6h PRN - Trazodone 50 mg qHS PRN sleep - Social work consult for rehab options including Methadone/Suboxone clinics  Splenomegaly with splenic infarction: Patient presenting with left-sided abdominal pain and flank pain and known subcentimeter lytic lesions in the thoracolumbar and sacral spine which are concerning for osteomyelitis. Splenic infarct is stable.  -IV Daptomycin -Voltaren gel 4 times daily as needed  Anemia: Hemoglobin 6.9 on admission likely due to bone marrow suppression from endocarditis. Splenic sequestration of red blood cells might also be playing a role. S/p 1 U pRBC. - Transfuse Hgb < 7  Leukopenia: White count 2.7 likely due to bone marrow suppression from endocarditis and chronic IV drug use.  Acute kidney failure, resolved: Serum creatinine 1.19, improved from 1.5 on 11/27/2015.  Possibly 2/2 Opana-associated TTP or septic emboli.   - CTM  Constipation -Miralax twice daily -Senokot BID - Mag Citrate once  DVT/PE prophylaxis: Lovenox  Diet: Regular  Dispo: Disposition is deferred at this time, awaiting improvement of current medical problems.    The patient does not have a current PCP (No Pcp Per Patient) and does need an [facility] hospital follow-up appointment after discharge.  The patient does not have transportation limitations that hinder transportation to clinic appointments.  .Services Needed at time of discharge: Y = Yes, Blank = No PT:   OT:   RN:   Equipment:   Other:     LOS: 5 days   Iline Oven, MD 12/21/2015, 9:44 AM

## 2015-12-22 ENCOUNTER — Other Ambulatory Visit: Payer: Self-pay | Admitting: Pharmacist

## 2015-12-22 DIAGNOSIS — I330 Acute and subacute infective endocarditis: Secondary | ICD-10-CM | POA: Insufficient documentation

## 2015-12-22 DIAGNOSIS — F1910 Other psychoactive substance abuse, uncomplicated: Secondary | ICD-10-CM

## 2015-12-22 DIAGNOSIS — Z9119 Patient's noncompliance with other medical treatment and regimen: Secondary | ICD-10-CM

## 2015-12-22 DIAGNOSIS — B9562 Methicillin resistant Staphylococcus aureus infection as the cause of diseases classified elsewhere: Secondary | ICD-10-CM

## 2015-12-22 DIAGNOSIS — R7881 Bacteremia: Secondary | ICD-10-CM

## 2015-12-22 DIAGNOSIS — R161 Splenomegaly, not elsewhere classified: Secondary | ICD-10-CM | POA: Insufficient documentation

## 2015-12-22 MED ORDER — ACETAMINOPHEN 325 MG PO TABS
650.0000 mg | ORAL_TABLET | Freq: Once | ORAL | Status: AC
  Administered 2015-12-22: 650 mg via ORAL
  Filled 2015-12-22: qty 2

## 2015-12-22 MED ORDER — ACETAMINOPHEN 325 MG PO TABS: 650.0000 mg | ORAL_TABLET | Freq: Four times a day (QID) | ORAL | Status: DC

## 2015-12-22 MED ORDER — TRAZODONE HCL 50 MG PO TABS: 50.0000 mg | ORAL_TABLET | Freq: Every evening | ORAL | Status: DC | PRN

## 2015-12-22 MED ORDER — SENNA 8.6 MG PO TABS: 2.0000 | ORAL_TABLET | Freq: Two times a day (BID) | ORAL | Status: DC

## 2015-12-22 MED ORDER — POLYETHYLENE GLYCOL 3350 17 G PO PACK: 17.0000 g | PACK | Freq: Two times a day (BID) | ORAL | Status: DC

## 2015-12-22 MED ORDER — OXYCODONE HCL ER 10 MG PO T12A: 10.0000 mg | EXTENDED_RELEASE_TABLET | Freq: Two times a day (BID) | ORAL | Status: DC

## 2015-12-22 MED ORDER — DALBAVANCIN HCL 500 MG IV SOLR: 1500.0000 mg | Freq: Once | INTRAVENOUS | Status: DC

## 2015-12-22 MED ORDER — ORITAVANCIN DIPHOSPHATE 400 MG IV SOLR
1200.0000 mg | Freq: Once | INTRAVENOUS | Status: AC
  Administered 2015-12-22: 1200 mg via INTRAVENOUS
  Filled 2015-12-22 (×2): qty 120

## 2015-12-22 MED ORDER — NALOXONE HCL 0.4 MG/0.4ML IJ SOAJ: 1.0000 | Freq: Once | INTRAMUSCULAR | Status: DC

## 2015-12-22 MED ORDER — DICLOFENAC SODIUM 1 % TD GEL: 4.0000 g | Freq: Four times a day (QID) | TRANSDERMAL | Status: DC

## 2015-12-22 MED ORDER — SULFAMETHOXAZOLE-TRIMETHOPRIM 800-160 MG PO TABS: 2.0000 | ORAL_TABLET | Freq: Two times a day (BID) | ORAL | Status: DC

## 2015-12-22 MED ORDER — SORBITOL 70 % SOLN
960.0000 mL | TOPICAL_OIL | Freq: Once | ORAL | Status: AC
  Administered 2015-12-22: 960 mL via RECTAL
  Filled 2015-12-22: qty 240

## 2015-12-22 MED FILL — *EVZIO 0.4 MG AUTO-INJECTOR: 0.4 | 1 days supply | Qty: 1 | Fill #0

## 2015-12-22 NOTE — Care Management Note (Signed)
Case Management Note  Patient Details  Name: Molly [name] MRN: [id number] Date of Birth: April 30, 1986  Subjective/Objective:                  DC to home. Will have PIVplaced and IV abx x 1 in several weeks through [facility] care.    Action/Plan:  DC to home today.  Expected Discharge Date:                  Expected Discharge Plan:  [place] (iv drug abuser, ? needing snf @ d/c for iv antibiotic  infusion)  In-House Referral:  Clinical Social Work  Discharge planning Services  CM Consult  Post Acute Care Choice:    Choice offered to:     DME Arranged:    DME Agency:     HH Arranged:  RN, IV Antibiotics HH Agency:  [facility]  Status of Service:  Completed, signed off  Medicare Important Message Given:    Date Medicare IM Given:    Medicare IM give by:    Date Additional Medicare IM Given:    Additional Medicare Important Message give by:     If discussed at [place] of Stay Meetings, dates discussed:    Additional Comments:  Carles Collet, RN 12/22/2015, 3:08 PM

## 2015-12-22 NOTE — Progress Notes (Signed)
Collaborated with [facility] outpatient pharmacy for naloxone [facility]) per Dr. Lovena Le. Will coordinate with patient/family for education.

## 2015-12-22 NOTE — Progress Notes (Signed)
CSW received consult for rehab facilities/ methadone/suboxone clinics.  CSW provided pt with rehab resources as well as list of MDs in [place] that supply suboxone.  Pt was appreciative of resources but has also been through this process before and has used a doctor in the past for suboxone.  No further CSW needs identified at this time  Domenica Reamer, [place] Social Worker [phone]

## 2015-12-22 NOTE — Progress Notes (Addendum)
Patient didn't want to wait until the whole dose of ABx will be done and she left without any paper work, without signing AMA paper. Patient received 179ml of ORBACTIV before she left. She left by herself; no family member was with her; IV was D/C. RN tried to informed MD.

## 2015-12-22 NOTE — Discharge Summary (Signed)
Name: Molly [name] MRN: [id number] DOB: 10-31-1985 30 y.o. PCP: No Pcp Per Patient  Date of Admission: 12/16/2015  7:21 PM Date of Discharge: 12/22/2015 Attending Physician: No att. providers found  Discharge Diagnosis: 1. Bacterial Endocarditis 2. Septic Emboli to lung, spleen, and thoracolumbosacral spine 3. Opioid Addiction   Principal Problem:   MRSA bacteremia Active Problems:   Septic pulmonary embolism (HCC)   Intravenous drug abuse, continuous   Pulmonary HTN (HCC)   Aortic valve endocarditis   Splenic infarction   Absolute anemia   Osteomyelitis of spine (HCC)   Pain of upper abdomen   Polysubstance abuse   Palliative care encounter   Constipation   Subacute bacterial endocarditis ([place])   Splenomegaly  Discharge Medications:   Medication List    TAKE these medications        acetaminophen 325 MG tablet  Commonly known as:  TYLENOL  Take 2 tablets (650 mg total) by mouth every 6 (six) hours.     diclofenac sodium 1 % Gel  Commonly known as:  VOLTAREN  Apply 4 g topically 4 (four) times daily.     Naloxone HCl 0.4 MG/0.4ML Soaj  Commonly known as:  EVZIO  Inject 1 Dose as directed once. Give for overdose, dial 9-1-1, may give another dose after 2-3 minutes. AttnRichardson [name]     oxyCODONE 10 mg 12 hr tablet  Commonly known as:  OXYCONTIN  Take 1 tablet (10 mg total) by mouth every 12 (twelve) hours.     polyethylene glycol packet  Commonly known as:  MIRALAX / GLYCOLAX  Take 17 g by mouth 2 (two) times daily.     senna 8.6 MG Tabs tablet  Commonly known as:  SENOKOT  Take 2 tablets (17.2 mg total) by mouth 2 (two) times daily.     traZODone 50 MG tablet  Commonly known as:  DESYREL  Take 1 tablet (50 mg total) by mouth at bedtime as needed for sleep.        Disposition and follow-up:   [name] was discharged from [facility] in Stable condition.  At the hospital follow up visit please address:  1.  Opioid use,  administration of Dalbavancin dose, antibiotic adherence  2.  Labs / imaging needed at time of follow-up: CBC, BMP, consider spine imaging  3.  Pending labs/ test needing follow-up: blood cultures  Follow-up Appointments: Follow-up Information    Follow up with [place].   Why:  IV INFUSION PUMP ARRANGED   Contact information:   [address] [place] [address] [phone]       Follow up with [facility].   Why:  Home health RN arranged   Contact information:   [address] [place] [address] [phone]       Follow up with [facility] On 12/26/2015.   Why:  Followup appointment scheduled for 12/26/2015 @ 11 am    Contact information:   [address] [place] [phone]      Discharge Instructions: Discharge Instructions    Call MD for:  persistant dizziness or light-headedness    Complete by:  As directed      Call MD for:  redness, tenderness, or signs of infection (pain, swelling, redness, odor or green/yellow discharge around incision site)    Complete by:  As directed      Call MD for:  severe uncontrolled  pain    Complete by:  As directed      Call MD for:  temperature >100.4    Complete by:  As directed      Diet - low sodium heart healthy    Complete by:  As directed      Increase activity slowly    Complete by:  As directed            Consultations: Treatment Team:  Palliative [facility]  Procedures Performed:  Dg Chest 2 View  11/27/2015  CLINICAL DATA:  Subacute bacterial endocarditis. EXAM: CHEST  2 VIEW COMPARISON:  November 20, 2015 FINDINGS: The heart size and mediastinal contours are stable. The heart size is enlarged. There is atelectasis of bilateral lung bases. Small left pleural effusion is identified. There is no pulmonary edema or focal pneumonia. The visualized skeletal structures are unremarkable. IMPRESSION:  Cardiomegaly. Mild atelectasis of bilateral lung bases. Small left pleural effusion. Electronically Signed   By: Abelardo Diesel M.D.   On: 11/27/2015 19:29   Ct Head Wo Contrast  12/17/2015  CLINICAL DATA:  History of endocarditis EXAM: CT HEAD WITHOUT CONTRAST TECHNIQUE: Contiguous axial images were obtained from the base of the skull through the vertex without intravenous contrast. COMPARISON:  None. FINDINGS: Bony calvarium is intact. No findings to suggest acute hemorrhage, acute infarction or space-occupying mass lesion are noted. IMPRESSION: No acute abnormality noted. Electronically Signed   By: Inez Catalina M.D.   On: 12/17/2015 10:26   Ct Abdomen Pelvis W Contrast  12/17/2015  CLINICAL DATA:  Abdominal and back pain. Recent diagnosis of endocarditis and splenomegaly. Worsening abdominal distention. Last bowel movement 1 week ago. History of intravenous drug use. EXAM: CT ABDOMEN AND PELVIS WITH CONTRAST TECHNIQUE: Multidetector CT imaging of the abdomen and pelvis was performed using the standard protocol following bolus administration of intravenous contrast. CONTRAST:  147mL OMNIPAQUE IOHEXOL 300 MG/ML  SOLN COMPARISON:  CT abdomen pelvis November 27, 2015 FINDINGS: LUNG BASES: Lung bases are clear. The heart appears moderately enlarged with stable moderate pericardial effusion. Resolution of LEFT pleural effusion. SOLID ORGANS: Massive splenomegaly is stable from prior imaging (20.3 cm in cranial caudad dimension) with similar ill-defined hypo enhancement superior aspect of the spleen. Liver, gallbladder, pancreas appear normal. Thickened adrenal glands can be seen with hyperplasia. GASTROINTESTINAL TRACT: The stomach, small and large bowel are normal in course and caliber without inflammatory changes. Moderate amount of retained large bowel stool. Normal appendix. KIDNEYS/ URINARY TRACT: Kidneys are orthotopic, demonstrating symmetric enhancement. No nephrolithiasis, hydronephrosis or solid renal  masses. The unopacified ureters are normal in course and caliber. Urinary bladder is partially distended with mild circumferential wall thickening. PERITONEUM/RETROPERITONEUM: Aortoiliac vessels are normal in course and caliber. Mild similar retroperitoneal lymphadenopathy. IUD in central uterus. Small amount of ascites, decreased from prior examination. SOFT TISSUE/OSSEOUS STRUCTURES: New multiple subcentimeter endplate lytic lesions in all lumbar vertebra, S1 and T10, T12. Mild anasarca, improved from prior CT. IMPRESSION: New subcentimeter lytic lesions in the thoracolumbar and sacral spine. Given acuity, and history of endocarditis these are concerning for osteomyelitis. Similar massive splenomegaly. Stable splenic probable infarct. Mild retroperitoneal lymphadenopathy is likely reactive. Moderate amount of retained large bowel stool without bowel obstruction. Mild urinary bladder wall thickening could can be seen with cystitis. Resolution of LEFT pleural effusion, small amount of ascites and improved anasarca. Stable cardiomegaly and moderate pericardial effusion. Electronically Signed   By: Elon Alas M.D.   On: 12/17/2015 00:04   Ct Abdomen Pelvis W  Contrast  11/27/2015  CLINICAL DATA:  Acute onset of MRSA endocarditis. Splenomegaly. Initial encounter. EXAM: CT ABDOMEN AND PELVIS WITH CONTRAST TECHNIQUE: Multidetector CT imaging of the abdomen and pelvis was performed using the standard protocol following bolus administration of intravenous contrast. CONTRAST:  27mL OMNIPAQUE IOHEXOL 300 MG/ML  SOLN COMPARISON:  CT of the abdomen and pelvis from 10/30/2015 FINDINGS: Trace right and small left pleural effusions are noted, with mild left basilar opacity, likely reflecting atelectasis. A small pericardial effusion is identified. Trace ascites is noted. There is persistent marked enlargement of the spleen, though it appears somewhat decreased in size from the prior study, measuring 20.4 cm in length. The  previous noted hypodensity within the spleen corresponds to a region of evolving infarct. The spleen is unremarkable in appearance. The gallbladder is grossly unremarkable. The pancreas and adrenal glands are grossly unremarkable. The kidneys are unremarkable in appearance. No significant perinephric stranding is appreciated. There is no evidence of hydronephrosis. No renal or ureteral stones are identified. The stomach is largely decompressed and grossly unremarkable, though difficult to fully assess. No acute vascular abnormalities are seen. The appendix is grossly remarkable in appearance, though difficult to fully characterize. Mild apparent wall thickening is suggested along the ascending colon, which may reflect a mild infectious or inflammatory process. A small amount of free fluid is seen within the pelvis. The bladder is mildly distended and grossly unremarkable. The uterus is grossly unremarkable in appearance, with an intrauterine device noted in expected position at the fundus of the uterus. The ovaries are relatively symmetric. No suspicious adnexal masses are seen. Mild diffuse soft tissue edema is noted along the abdominal and pelvic wall, compatible with mild anasarca. No acute osseous abnormalities are identified. IMPRESSION: 1. Persistent marked enlargement of the spleen, though it appears mildly decreased in size from the prior study, measuring 20.4 cm in length. Region of evolving infarct noted within the spleen, relatively small compared to the size of the spleen. 2. Mild apparent wall thickening suggested along the ascending colon, which may reflect a mild infectious or inflammatory process. 3. Trace right and small left pleural effusions, with mild left basilar opacity, likely reflecting atelectasis. 4. Trace abdominopelvic ascites noted. 5. Small pericardial effusion seen. 6. Mild diffuse soft tissue edema along the abdominal and pelvic wall, compatible with mild anasarca. Electronically  Signed   By: Garald Balding M.D.   On: 11/27/2015 21:47    2D Echo: Study Conclusions  - Left ventricle: The cavity size was normal. There was mild concentric hypertrophy. Systolic function was normal. The estimated ejection fraction was in the range of 50% to 55%. Wall motion was normal; there were no regional wall motion abnormalities. Left ventricular diastolic function parameters were normal. Doppler parameters are consistent with elevated mean left atrial filling pressure. - Aortic valve: Transvalvular velocity was within the normal range. There was no stenosis. There was no regurgitation. - Left atrium: The atrium was moderately dilated. - Right ventricle: The cavity size was normal. Wall thickness was normal. Systolic function was normal. - Right atrium: The atrium was severely dilated. - Atrial septum: A patent foramen ovale cannot be excluded. - Tricuspid valve: There was moderate regurgitation. - Pulmonic valve: There was moderate regurgitation. - Pulmonary arteries: Systolic pressure was severely increased. PA peak pressure: 52 mm Hg (S). - Inferior vena cava: The vessel was dilated. The respirophasic diameter changes were blunted (< 50%), consistent with elevated central venous pressure. - Pericardium, extracardiac: A moderate pericardial effusion was identified  circumferential to the heart.  Cardiac Cath:   Admission HPI: Patient is a 30 year old female with a past medical history of IV drug use, MRSA valve endocarditis, splenic infarction, and septic pulmonary emboli presenting to the hospital with a one month history of abdominal pain and back pain. Patient states her symptoms have been getting worse for the past 2 days and that is why she decided to seek medical attention. States her pain is located in the left flank region and radiates to the left side of the abdomen. She describes the pain as intermittent in nature, 10 out of 10 in severity,  "sharp, cramping, churning." States she has been taking Tylenol to help alleviate the pain. Denies having any falls or trauma to the back area. States she has not been able to bear weight effectively on her left leg and struggles to climb stairs. Reports having fevers, chills, and diaphoresis. Denies having any nausea, vomiting, or diarrhea. Also reports experiences sharp, substernal chest pain and shortness of breath when bending over or climbing stairs. Denies having any numbness or tingling in her extremities. Patient states she has been hospitalized several times in the past for endocarditis and has left the hospital [place] on several occasions due to her drug addiction problem. Reports using IV Opana 40 mg daily and IV cocaine occasionally. States she has been using drugs since age of 57 and has been to rehabilitation several times.  Records show patient originally started spiking fevers in early January, went to [facility] where she was diagnosed with MRSA aortic valve endocarditis from IV drug abuse. She had a 5 x 8 mm vegetation on the coronary cusp and was started on IV vancomycin. Patient left AGAINST MEDICAL ADVICE at least 3 times during that hospitalization and never completed her course of the antibiotic. She was recently hospitalized on 11/27/2015 again for MRSA valve endocarditis. A TTE done during this hospitalization did not show a vegetation. The patient was treated with IV daptomycin. Vancomycin was avoided due to her acute kidney failure, anemia, and thrombocytopenia. Blood cultures did not grow any organisms after 3 days. Cardiology was consulted and they did not feel she was a good surgical candidate. The plan was to discharge the patient with 6 weeks of IV antibiotics but she left the hospital [place] [facility].   Hospital Course by problem list: Principal Problem:   MRSA bacteremia Active Problems:   Septic pulmonary embolism (HCC)   Intravenous drug  abuse, continuous   Pulmonary HTN (HCC)   Aortic valve endocarditis   Splenic infarction   Absolute anemia   Osteomyelitis of spine (HCC)   Pain of upper abdomen   Polysubstance abuse   Palliative care encounter   Constipation   Subacute bacterial endocarditis (HCC)   Splenomegaly   Subacute Bacterial Endocarditis with septic emboli to lung, spleen, and thoracolumbosacral spine: Repeat echocardiogram did not show vegetation.  Patient was initially managed on IV Daptomycin for MRSA bacteremia/endocarditis and osteomyelitis.  Blood cultures remained negative.  She had no febrile episodes while admitted.  It was planned for her to receive a single dose of Oritavancin 1200 mg IV on 3/6 and she was scheduled to receive a home infusion of Dalbavancin 1500 mg IV once on 3/27.  It was recommended by ID to also discharge patient with Bactrim DS 2 tabs twice daily for 1 month for oral therapeutic dosing.  However, she left the hospital before completion of the Oritavancin and without signing any appropriate paperwork.  Per report, she only received approximately 10% of the dose of Oritavancin.  Due to nonadherence, the dose of Dalbavancin was also cancelled.  Patient contacted and she agrees to take her course of Bactrim.  She is to still follow up with ID.    Opioid Addiction: Patient reported use of Opana 40 mg IV daily for her addiction.  She was managed on Oxycontin BID and Oxycodone IR to prevent withdrawal.  Her narcotics were weaned 25% every 48 hours.  As she left prior to discharge and without signing any paperwork, she was not discharged with any narcotics in order to complete her taper.  CKD1: Patient presented in early February with AKI prior to eloping from the hospital.  On readmission, her Cr was elevated at 1.19, remaining 1-1.1 during admission.  Discharge Vitals:   BP 141/96 mmHg  Pulse 88  Temp(Src) 98.8 F (37.1 C) (Oral)  Resp 18  Ht 5\' 2"  (1.575 m)  Wt 112 lb 6.4 oz (50.984 kg)   BMI 20.55 kg/m2  SpO2 97%  LMP 11/11/2015  Discharge Labs:  Results for orders placed or performed during the hospital encounter of 12/16/15 (from the past 24 hour(s))  BLOOD TRANSFUSION REPORT - SCANNED     Status: None   Collection Time: 12/24/15 11:54 AM   Narrative   Ordered by an unspecified provider.    Signed: Iline Oven, MD 12/24/2015, 1:43 PM    Services Ordered on Discharge: none Equipment Ordered on Discharge: none

## 2015-12-22 NOTE — Progress Notes (Signed)
Daily Progress Note   Patient Name: Molly [name]       Date: 12/22/2015 DOB: 10/27/1985  Age: 30 y.o. MRN#: [id number] Attending Physician: Oval Linsey, MD Primary Care Physician: No PCP Per Patient Admit Date: 12/16/2015  Reason for Consultation/Follow-up: Pain control and Psychosocial/spiritual support  Subjective: Ms. Thoms is sitting in chair on arrival to room. She reports that she is hopeful she can still be discharged today but was told that she needs to wait to be evaluated by the infectious disease team.  She reports continued to have pain in her back. We talked about her regimen here in the hospital as well the fact that she needs to have follow-up as an outpatient for continued management of her substance abuse. I also called and spoke with her mother at her request in order to update her on plan.  Length of Stay: 6 days  Current Medications: Scheduled Meds:  . acetaminophen  650 mg Oral Q6H  . DAPTOmycin (CUBICIN)  IV  500 mg Intravenous Q24H  . diclofenac sodium  4 g Topical QID  . enoxaparin (LOVENOX) injection  30 mg Subcutaneous Q24H  . oritavancin (ORBACTIV) IVPB  1,200 mg Intravenous Once  . oxyCODONE  10 mg Oral Q12H  . polyethylene glycol  17 g Oral BID  . senna  2 tablet Oral BID    Continuous Infusions:    PRN Meds: traZODone  Physical Exam: Physical Exam   General: Lying in chair, in no acute distress. Thin.  Heart: Regular rate and rhythm. Low grade SEM appreciated. Lungs: Good air movement, clear Abdomen: Soft, distended, positive bowel sounds. Ext: No significant edema. Needle markings present. Skin: Warm and dry. No piloerection noted         Vital Signs: BP 139/118 mmHg  Pulse 81  Temp(Src) 98.4 F (36.9 C) (Oral)  Resp 16  Ht 5\' 2"  (1.575 m)  Wt 50.984 kg (112 lb 6.4 oz)   BMI 20.55 kg/m2  SpO2 99%  LMP 11/11/2015 SpO2: SpO2: 99 % O2 Device: O2 Device: Not Delivered O2 Flow Rate:    Intake/output summary:   Intake/Output Summary (Last 24 hours) at 12/22/15 1054 Last data filed at 12/22/15 [id number]  Gross per 24 hour  Intake    560 ml  Output      0 ml  Net    560 ml   LBM: Last BM Date: 12/18/15 Baseline Weight: Weight: 50.984 kg (112 lb 6.4 oz) Most recent weight: Weight: 50.984 kg (112 lb 6.4 oz)       Palliative Assessment/Data: Flowsheet Rows        Most Recent Value   Intake Tab    Referral Department  -- [internal medicine]   Unit at Time of Referral  Med/Surg Unit   Palliative Care Primary Diagnosis  Sepsis/Infectious Disease   Date Notified  12/17/15   Palliative Care Type  New Palliative care   Reason for referral  Pain, Clarify Goals of Care   Date of Admission  12/16/15   Date first seen by Palliative Care  12/17/15   # of days Palliative referral response time  0 Day(s)   # of days IP prior to Palliative referral  1   Clinical Assessment    Psychosocial & Spiritual Assessment    Palliative Care Outcomes       Additional Data Reviewed: CBC    Component Value Date/Time   WBC 2.9* 12/20/2015 0537   RBC 3.31* 12/20/2015 0537   RBC 2.76* 11/27/2015 2247   HGB 8.2* 12/20/2015 0537   HCT 27.0* 12/20/2015 0537   PLT 208 12/20/2015 0537   MCV 81.6 12/20/2015 0537   MCH 24.8* 12/20/2015 0537   MCHC 30.4 12/20/2015 0537   RDW 18.2* 12/20/2015 0537   LYMPHSABS 0.9 12/20/2015 0537   MONOABS 0.2 12/20/2015 0537   EOSABS 0.0 12/20/2015 0537   BASOSABS 0.0 12/20/2015 0537    CMP     Component Value Date/Time   NA 140 12/20/2015 0537   K 3.7 12/20/2015 0537   CL 108 12/20/2015 0537   CO2 23 12/20/2015 0537   GLUCOSE 96 12/20/2015 0537   BUN 19 12/20/2015 0537   CREATININE 1.12* 12/20/2015 0537   CALCIUM 8.8* 12/20/2015 0537   PROT 8.2* 12/16/2015 1837   ALBUMIN 3.0* 12/16/2015 1837   AST 18 12/16/2015 1837   ALT 13*  12/16/2015 1837   ALKPHOS 119 12/16/2015 1837   BILITOT 0.2* 12/16/2015 1837   GFRNONAA >60 12/20/2015 0537   GFRAA >60 12/20/2015 0537       Problem List:  Patient Active Problem List   Diagnosis Date Noted  . Constipation   . Absolute anemia   . Osteomyelitis of spine ([place])   . Pain of upper abdomen   . Polysubstance abuse   . Palliative care encounter   . Endocarditis 12/17/2015  . Subacute bacterial endocarditis ([place]) 12/16/2015  . Cocaine use 11/28/2015  . 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 Pancytopenia ([place]) 11/27/2015  . AKI (acute kidney injury) ([place]) 11/27/2015  . Anasarca 11/27/2015  . Anemia, unspecified 07/04/2013  . Pulmonary HTN ([place]) 07/04/2013  . 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 Septic pulmonary embolism ([place]) 07/03/2013  . Intravenous drug abuse, continuous 07/03/2013  . MRSA bacteremia 07/03/2013     Palliative Care Assessment & Plan    1.Code Status:  Full code    Code Status Orders        Start     Ordered   12/17/15 0211  Full code   Continuous     12/17/15 0217    Code Status History    Date Active Date Inactive Code Status Order ID Comments User Context   11/27/2015  9:41 PM 12/01/2015  2:13 AM Full Code [id number]  Riccardo Dubin, MD Inpatient   07/03/2013  9:43 PM 07/09/2013  4:22 AM Full Code [id number]  Etta Quill, DO ED       2. Goals of Care/Additional Recommendations:  Plan to complete 6 week course of abx as OP.  Family has been looking into suboxone clinic on discharge. I spoke again with her mother regarding need for follow-up on discharge for continued substance abuse.  Limitations on Scope of Treatment: Full Scope Treatment  Psycho-social Needs: Referral to Community Resources : Substance abuse  3. Symptom Management:      Pain complicated by active substance abuse:   - She will be best served to have f/u with drug treatment facility/subutex or maintenance  methadone clinic on discharge.   - Currently on Oxycontin 10mg  BID and weaning down in order to avoid withdrawal.  I would recommend continuing with this dose, which  is roughly equivalent to 30 mg of oral morphine, for another 48 hours and then discontinue altogether.  - I would not send her with a prescription for any short acting opioids nor would I send her with a longer prescription of OxyContin.   - I called and spoke with her mother and she will administer these pills over the next 2 days.   - I remain concerned about ensuring we have a good plan for f/u on discharge due to there lack of resources and high risk for relapse.  Appreciate SW assistance in determining resources.   4. Palliative Prophylaxis:   Aspiration, Bowel Regimen and Delirium Protocol  5. Prognosis: Unable to determine  6. Discharge Planning:  To be determined   Care plan was discussed with patient  Thank you for allowing the Palliative Medicine Team to assist in the care of this patient.   Time In: 1040 Time Out: 1110 Total Time 30 Prolonged Time Billed No        Micheline Rough, MD  12/22/2015, 10:54 AM  Please contact Palliative Medicine Team phone at [phone] for questions and concerns.

## 2015-12-22 NOTE — Progress Notes (Signed)
Subjective: Molly [name]. Patient reports improved back pain after BM after enema.  She denies fever or SOB.  She has not been able to find a rehab/suboxone clinic for discharge.  She does have literature on NA meetings.  Objective: Vital signs in last 24 hours: Filed Vitals:   12/21/15 0630 12/21/15 1418 12/21/15 2247 12/22/15 0523  BP: 133/98 146/95 144/95 139/118  Pulse: 78 68 78 81  Temp: 98.4 F (36.9 C) 98.7 F (37.1 C) 98.5 F (36.9 C) 98.4 F (36.9 C)  TempSrc: Oral Oral Oral Oral  Resp: 16 16 16 16   Height:      Weight:      SpO2: 97% 98% 98% 99%   Weight change:   Intake/Output Summary (Last 24 hours) at 12/22/15 1052 Last data filed at 12/22/15 0924  Gross per 24 hour  Intake    560 ml  Output      0 ml  Net    560 ml   Physical Exam  Constitutional: She is oriented to person, place, and time.  WD, WN, calm and pleasant, NAD.  HENT:  Head: Normocephalic and atraumatic.  Eyes: EOM are normal. No scleral icterus.  Neck: No JVD present. No tracheal deviation present.  Cardiovascular: Normal rate, regular rhythm and intact distal pulses.   Grade III/VI holosystolic murmur heard best at LUSB.  Pulmonary/Chest: Effort normal and breath sounds normal. No stridor. No respiratory distress. She has no wheezes.  Good air movement throughout.  Abdominal: Soft. She exhibits no distension. There is no rebound and no guarding.  Minimally tender to palpation diffusely.  Liver palpable 3 cm below costal margin.  Spleen palpable 5-6 cm below costal margin, near umbilicus.  Musculoskeletal: She exhibits no edema.  Neurological: She is alert and oriented to person, place, and time.  Skin: Skin is warm and dry.  Not diaphoretic.    Lab Results: Basic Metabolic Panel:  Recent Labs Lab 12/18/15 0919 12/20/15 0537  NA 139 140  K 3.2* 3.7  CL 107 108  CO2 23 23  GLUCOSE 107* 96  BUN 15 19  CREATININE 1.11* 1.12*  CALCIUM 8.8* 8.8*   Liver Function Tests:  Recent  Labs Lab 12/16/15 1837  AST 18  ALT 13*  ALKPHOS 119  BILITOT 0.2*  PROT 8.2*  ALBUMIN 3.0*    Recent Labs Lab 12/16/15 1837  LIPASE 52*   No results for input(s): AMMONIA in the last 168 hours. CBC:  Recent Labs Lab 12/18/15 0919 12/20/15 0537  WBC 3.3* 2.9*  NEUTROABS 2.1 1.8  HGB 8.4* 8.2*  HCT 25.0* 27.0*  MCV 78.9 81.6  PLT 184 208   Cardiac Enzymes:  Recent Labs Lab 12/17/15 0550  CKTOTAL 15*   BNP: No results for input(s): PROBNP in the last 168 hours. D-Dimer: No results for input(s): DDIMER in the last 168 hours. CBG: No results for input(s): GLUCAP in the last 168 hours. Hemoglobin A1C: No results for input(s): HGBA1C in the last 168 hours. Fasting Lipid Panel: No results for input(s): CHOL, HDL, LDLCALC, TRIG, CHOLHDL, LDLDIRECT in the last 168 hours. Thyroid Function Tests: No results for input(s): TSH, T4TOTAL, FREET4, T3FREE, THYROIDAB in the last 168 hours. Coagulation:  Recent Labs Lab 12/16/15 2105  LABPROT 14.8  INR 1.14   Anemia Panel: No results for input(s): VITAMINB12, FOLATE, FERRITIN, TIBC, IRON, RETICCTPCT in the last 168 hours. Urine Drug Screen: Drugs of Abuse     Component Value Date/Time   LABOPIA POSITIVE* 12/16/2015 1840  COCAINSCRNUR POSITIVE* 12/16/2015 1840   LABBENZ NONE DETECTED 12/16/2015 1840   AMPHETMU NONE DETECTED 12/16/2015 1840   THCU NONE DETECTED 12/16/2015 1840   LABBARB NONE DETECTED 12/16/2015 1840    Alcohol Level: No results for input(s): ETH in the last 168 hours. Urinalysis:  Recent Labs Lab 12/16/15 [address] 1.020  PHURINE 6.0  GLUCOSEU NEGATIVE  HGBUR LARGE*  BILIRUBINUR NEGATIVE  KETONESUR NEGATIVE  PROTEINUR 100*  NITRITE NEGATIVE  LEUKOCYTESUR NEGATIVE   Misc. Labs:   Micro Results: Recent Results (from the past 240 hour(s))  Culture, blood (Routine X 2) w Reflex to ID Panel     Status: None   Collection Time: 12/16/15  6:37 PM  Result Value  Ref Range Status   Specimen Description BLOOD RIGHT ANTECUBITAL  Final   Special Requests IN PEDIATRIC BOTTLE [place]  Final   Culture NO GROWTH 5 DAYS  Final   Report Status 12/21/2015 FINAL  Final  Culture, blood (Routine X 2) w Reflex to ID Panel     Status: None   Collection Time: 12/16/15  8:56 PM  Result Value Ref Range Status   Specimen Description BLOOD RIGHT HAND  Final   Special Requests IN PEDIATRIC BOTTLE 1CC  Final   Culture NO GROWTH 5 DAYS  Final   Report Status 12/21/2015 FINAL  Final  MRSA PCR Screening     Status: None   Collection Time: 12/17/15 12:43 AM  Result Value Ref Range Status   MRSA by PCR NEGATIVE NEGATIVE Final    Comment:        The GeneXpert MRSA Assay (FDA approved for NASAL specimens only), is one component of a comprehensive MRSA colonization surveillance program. It is not intended to diagnose MRSA infection nor to guide or monitor treatment for MRSA infections.    Studies/Results: No results found. Medications: I have reviewed the patient's current medications. Scheduled Meds: . acetaminophen  650 mg Oral Q6H  . DAPTOmycin (CUBICIN)  IV  500 mg Intravenous Q24H  . diclofenac sodium  4 g Topical QID  . enoxaparin (LOVENOX) injection  30 mg Subcutaneous Q24H  . oritavancin (ORBACTIV) IVPB  1,200 mg Intravenous Once  . oxyCODONE  10 mg Oral Q12H  . polyethylene glycol  17 g Oral BID  . senna  2 tablet Oral BID   Continuous Infusions:  PRN Meds:. Assessment/Plan: Principal Problem:   Subacute bacterial endocarditis (HCC) Active Problems:   Septic pulmonary embolism (HCC)   Intravenous drug abuse, continuous   Pulmonary HTN (HCC)   Aortic valve endocarditis   Splenic infarction   Endocarditis   Absolute anemia   Osteomyelitis of spine (HCC)   Pain of upper abdomen   Polysubstance abuse   Palliative care encounter   Constipation  Molly [name] is a 30 yo female with PMH of IV drug use, MRSA valve endocarditis, splenic infarction, and  septic pulmonary emboli presenting to the hospital with a one month history of abdominal pain and back pain.   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  Repeat TTE with increased PA pressure without evidence of vegetation. Plan for disposition is now to provide single dose of Oritavancin while inpatient.  She will then receive home health infusion of Dalbavancin, generously provided by [facility].  Pharmacy has stated this as an  off label use of the antibiotics, but it still appears in the best interest of the patient in order to prevent abuse through and infection of the PICC line.  In addition, given her history of leaving AMA, long acting drugs are the best option for completing the course of treatment.  In addition, ID recommending Bactrim DS 2 tabs BID for 1 month and ID follow up. - IV daptomycin (3/1 - ?) - Oritavancin 1200 mg IV once on 3/6 - Dalbavancin1500 mg IV once on 3/27  [ ]  Blood cultures NGTD - Palliative care consult. We appreciate your help and recommendations  Opioid Addiction: Patient reports regular use of Opana 40 mg daily for opioid addiction.  She has h/o leaving AMA in order to resume her habit prior to completion of antibiotics.  At last admission, she left while receiving Oxycontin 10 mg q12h.  Palliative care has given recommendations for her narcotic management to prevent withdrawal.  We appreciate their help in the weaning process, weaning ~25% every 48 hours.  As part of weaning process, will discharge patient with Oxycontin 10 mg BID for 2 days, after which she will stop.  We appreciate social work's help in arranging affordable resources. - Acetaminophen 650 mg q6h scheduled - Oxycodone 10 mg q12h scheduled - STOP OxyIR 5 mg q6h PRN - Trazodone 50 mg qHS PRN  sleep - Social work consult for rehab options including Methadone/Suboxone clinics  Splenomegaly with splenic infarction: Patient presenting with left-sided abdominal pain and flank pain and known subcentimeter lytic lesions in the thoracolumbar and sacral spine which are concerning for osteomyelitis. Splenic infarct is stable.  -IV Daptomycin -Voltaren gel 4 times daily as needed  Anemia: Hemoglobin 6.9 on admission likely due to bone marrow suppression from endocarditis. Splenic sequestration of red blood cells might also be playing a role. S/p 1 U pRBC. - Transfuse Hgb < 7  Leukopenia: White count 2.7 likely due to bone marrow suppression from endocarditis and chronic IV drug use.  Acute kidney failure, resolved: Serum creatinine 1.19, improved from 1.5 on 11/27/2015.  Possibly 2/2 Opana-associated TTP or septic emboli.   - CTM  Constipation -Miralax twice daily -Senokot BID  DVT/PE prophylaxis: Lovenox  Diet: Regular  Dispo: Disposition is deferred at this time, awaiting improvement of current medical problems.    The patient does not have a current PCP (No Pcp Per Patient) and does need an [facility] hospital follow-up appointment after discharge.  The patient does not have transportation limitations that hinder transportation to clinic appointments.  .Services Needed at time of discharge: Y = Yes, Blank = No PT:   OT:   RN:   Equipment:   Other:     LOS: 6 days   Iline Oven, MD 12/22/2015, 10:52 AM

## 2015-12-22 NOTE — Progress Notes (Signed)
Internal Medicine Attending  Date: 12/22/2015  Patient name: Molly [name] Medical record number: [id number] Date of birth: Dec 16, 1985 Age: 30 y.o. Gender: female  I saw and evaluated the patient. I reviewed the resident's note by Dr. Lovena Le and I agree with the resident's findings and plans as documented in his progress note.  [name] was without acute complaints when seen on rounds this morning. Her exam was unchanged as she continued to have some mild left-sided abdominal pain to palpation. We appreciate ID's recommendations and we will institute them. She will be discharged home today on Bactrim and receive a dose of dalbavacin in 3 weeks. She continues to work on finding appropriate help for her opiate addiction closer to her home in [place]. Follow-up will also be in the infectious disease clinic in one month.

## 2015-12-22 NOTE — Consult Note (Signed)
[facility] for Infectious Disease    Date of Admission:  12/16/2015    Day 7 daptomycin       Reason for Consult: MRSA aortic valve endocarditis with bacteremia and disseminated infection    Referring Physician: Dr. Velna Hatchet  Principal Problem:   MRSA bacteremia Active Problems:   Septic pulmonary embolism ([place])   Aortic valve endocarditis   Splenic infarction   Osteomyelitis of spine (HCC)   Intravenous drug abuse, continuous   Pulmonary HTN (HCC)   Absolute anemia   Pain of upper abdomen   Polysubstance abuse   Palliative care encounter   Constipation   . acetaminophen  650 mg Oral Q6H  . DAPTOmycin (CUBICIN)  IV  500 mg Intravenous Q24H  . diclofenac sodium  4 g Topical QID  . enoxaparin (LOVENOX) injection  30 mg Subcutaneous Q24H  . oritavancin (ORBACTIV) IVPB  1,200 mg Intravenous Once  . oxyCODONE  10 mg Oral Q12H  . polyethylene glycol  17 g Oral BID  . senna  2 tablet Oral BID    Recommendations: 1. 1 dose of Oritavancin today before discharge followed by a dose of dalbavancin in 3 weeks 2. Trimethoprim sulfamethoxazole 2 double strength tablets twice a day for 1 month 3. I will arrange follow-up in my clinic within 1 month   Assessment: She has had MRSA aortic valve endocarditis and has been unable to remain in the hospital to complete therapy. She is not a candidate for outpatient IV antibiotic therapy and will not accept placement in a nursing facility. There are no good guidelines to help no what is the optimal way to manage this very difficult situation. Fortunately all recent blood cultures here have been negative and she has no evidence of aortic valve regurgitation or enlarging vegetation by 2 recent transthoracic echocardiograms. Although her recent CT scan raised the possibility of vertebral infection after 3 small lytic lesions were seen in T10, T12 and S1 her clinical exam and pattern of pain do not suggest vertebral infection. After  reviewing all of the options I recommend giving 1 dose of long-acting oritavancin today and follow that with his outpatient dose of dalbavancin in 3 weeks. I will also treat her with high-dose oral trimethoprim sulfamethoxazole. I will arrange follow-up in my clinic within 1 month.    HPI: Molly [name] is a 30 y.o. female injecting drug user who was diagnosed with MRSA aortic valve endocarditis in early January at [facility]. She was [facility] to [facility] but left AMA. She was readmitted there but left AMA again. She only received a few days of vancomycin while there. TEE showed a small aortic valve vegetation. She was readmitted here on 09/25/2016 but left AMA 3 days later. She was readmitted here on 10/14/2016. She was not on any antibiotics between these hospitalizations. She denies having any fever, chills or sweats in the past month. She came back to the hospital this past time because of constipation. She has had some intermittent right lower back pain over the past month. It is similar to pain she has had in the past. The pain comes and goes. Tylenol can get rid of the pain completely for several hours. She states that there is no way she can stay in the hospital for the recommended 6 weeks of antibiotic therapy. She is eager to get back into drug therapy. She took Suboxone several years ago and was able to stay clean for  several months. She lives in [place] with her parents. She is very eager to leave the hospital today.  Review of Systems: Review of Systems  Constitutional: Negative for fever, chills, weight loss, malaise/fatigue and diaphoresis.  HENT: Negative for sore throat.   Respiratory: Negative for cough, sputum production and shortness of breath.   Cardiovascular: Negative for chest pain.  Gastrointestinal: Positive for constipation. Negative for nausea, vomiting, abdominal pain and diarrhea.  Genitourinary: Negative for dysuria.  Musculoskeletal: Positive for back  pain. Negative for myalgias and joint pain.  Skin: Negative for rash.  Neurological: Negative for headaches.  Psychiatric/Behavioral: Positive for substance abuse. Negative for depression. The patient is nervous/anxious.     Past Medical History  Diagnosis Date  . IV drug user   . MRSA (methicillin resistant Staphylococcus aureus) infection   . Abscess of skin     buttock - most recently 2009/10    Social History  Substance Use Topics  . Smoking status: Current Every Day Smoker -- 1.00 packs/day    Types: Cigarettes  . Smokeless tobacco: None  . Alcohol Use: No    Family History  Problem Relation Age of Onset  . Alcoholism Father    No Known Allergies  OBJECTIVE: Blood pressure 141/96, pulse 88, temperature 98.8 F (37.1 C), temperature source Oral, resp. rate 18, height 5\' 2"  (1.575 m), weight 112 lb 6.4 oz (50.984 kg), last menstrual period 11/11/2015, SpO2 97 %.  Physical Exam  Constitutional: She is oriented to person, place, and time.  She is seated in a chair. She is very pleasant and in no distress.  HENT:  Mouth/Throat: No oropharyngeal exudate.  Eyes: Conjunctivae are normal.  Cardiovascular: Normal rate and regular rhythm.   No murmur heard. Pulmonary/Chest: Breath sounds normal.  Abdominal: Soft. There is no tenderness.  Musculoskeletal: Normal range of motion.  Neurological: She is alert and oriented to person, place, and time.  Skin: No rash noted.  Psychiatric: Mood and affect normal.    Lab Results Lab Results  Component Value Date   WBC 2.9* 12/20/2015   HGB 8.2* 12/20/2015   HCT 27.0* 12/20/2015   MCV 81.6 12/20/2015   PLT 208 12/20/2015    Lab Results  Component Value Date   CREATININE 1.12* 12/20/2015   BUN 19 12/20/2015   NA 140 12/20/2015   K 3.7 12/20/2015   CL 108 12/20/2015   CO2 23 12/20/2015    Lab Results  Component Value Date   ALT 13* 12/16/2015   AST 18 12/16/2015   ALKPHOS 119 12/16/2015   BILITOT 0.2* 12/16/2015      Microbiology: Recent Results (from the past 240 hour(s))  Culture, blood (Routine X 2) w Reflex to ID Panel     Status: None   Collection Time: 12/16/15  6:37 PM  Result Value Ref Range Status   Specimen Description BLOOD RIGHT ANTECUBITAL  Final   Special Requests IN PEDIATRIC BOTTLE [place]  Final   Culture NO GROWTH 5 DAYS  Final   Report Status 12/21/2015 FINAL  Final  Culture, blood (Routine X 2) w Reflex to ID Panel     Status: None   Collection Time: 12/16/15  8:56 PM  Result Value Ref Range Status   Specimen Description BLOOD RIGHT HAND  Final   Special Requests IN PEDIATRIC BOTTLE [place]  Final   Culture NO GROWTH 5 DAYS  Final   Report Status 12/21/2015 FINAL  Final  MRSA PCR Screening     Status: None  Collection Time: 12/17/15 12:43 AM  Result Value Ref Range Status   MRSA by PCR NEGATIVE NEGATIVE Final    Comment:        The GeneXpert MRSA Assay (FDA approved for NASAL specimens only), is one component of a comprehensive MRSA colonization surveillance program. It is not intended to diagnose MRSA infection nor to guide or monitor treatment for MRSA infections.     Michel Bickers, MD [facility] for [facility] [phone] pager   [phone] cell 12/22/2015, 2:53 PM

## 2015-12-23 MED ORDER — SULFAMETHOXAZOLE-TRIMETHOPRIM 800-160 MG PO TABS: 2.0000 | ORAL_TABLET | Freq: Two times a day (BID) | ORAL | Status: DC

## 2015-12-24 ENCOUNTER — Other Ambulatory Visit: Payer: Self-pay | Admitting: Internal Medicine

## 2015-12-24 MED ORDER — SULFAMETHOXAZOLE-TRIMETHOPRIM 800-160 MG PO TABS: 2.0000 | ORAL_TABLET | Freq: Two times a day (BID) | ORAL | Status: DC

## 2015-12-26 ENCOUNTER — Encounter: Payer: Self-pay | Admitting: Pharmacist

## 2015-12-26 ENCOUNTER — Ambulatory Visit: Payer: Self-pay | Attending: Physician Assistant | Admitting: Physician Assistant

## 2015-12-26 ENCOUNTER — Ambulatory Visit (HOSPITAL_BASED_OUTPATIENT_CLINIC_OR_DEPARTMENT_OTHER): Payer: Self-pay | Admitting: Clinical

## 2015-12-26 VITALS — BP 117/78 | HR 93 | Temp 98.0°F | Resp 14 | Ht 62.0 in | Wt 106.8 lb

## 2015-12-26 DIAGNOSIS — R109 Unspecified abdominal pain: Secondary | ICD-10-CM | POA: Insufficient documentation

## 2015-12-26 DIAGNOSIS — B9562 Methicillin resistant Staphylococcus aureus infection as the cause of diseases classified elsewhere: Secondary | ICD-10-CM

## 2015-12-26 DIAGNOSIS — Z86711 Personal history of pulmonary embolism: Secondary | ICD-10-CM | POA: Insufficient documentation

## 2015-12-26 DIAGNOSIS — M5441 Lumbago with sciatica, right side: Secondary | ICD-10-CM

## 2015-12-26 DIAGNOSIS — M545 Low back pain: Secondary | ICD-10-CM | POA: Insufficient documentation

## 2015-12-26 DIAGNOSIS — F1920 Other psychoactive substance dependence, uncomplicated: Secondary | ICD-10-CM | POA: Insufficient documentation

## 2015-12-26 DIAGNOSIS — R601 Generalized edema: Secondary | ICD-10-CM | POA: Insufficient documentation

## 2015-12-26 DIAGNOSIS — R1010 Upper abdominal pain, unspecified: Secondary | ICD-10-CM

## 2015-12-26 DIAGNOSIS — D649 Anemia, unspecified: Secondary | ICD-10-CM | POA: Insufficient documentation

## 2015-12-26 DIAGNOSIS — F1910 Other psychoactive substance abuse, uncomplicated: Secondary | ICD-10-CM

## 2015-12-26 DIAGNOSIS — D735 Infarction of spleen: Secondary | ICD-10-CM | POA: Insufficient documentation

## 2015-12-26 DIAGNOSIS — N179 Acute kidney failure, unspecified: Secondary | ICD-10-CM | POA: Insufficient documentation

## 2015-12-26 DIAGNOSIS — M549 Dorsalgia, unspecified: Secondary | ICD-10-CM | POA: Insufficient documentation

## 2015-12-26 DIAGNOSIS — Z8614 Personal history of Methicillin resistant Staphylococcus aureus infection: Secondary | ICD-10-CM | POA: Insufficient documentation

## 2015-12-26 DIAGNOSIS — A4902 Methicillin resistant Staphylococcus aureus infection, unspecified site: Secondary | ICD-10-CM | POA: Insufficient documentation

## 2015-12-26 DIAGNOSIS — R102 Pelvic and perineal pain: Secondary | ICD-10-CM | POA: Insufficient documentation

## 2015-12-26 DIAGNOSIS — Z79899 Other long term (current) drug therapy: Secondary | ICD-10-CM | POA: Insufficient documentation

## 2015-12-26 DIAGNOSIS — F1120 Opioid dependence, uncomplicated: Secondary | ICD-10-CM

## 2015-12-26 DIAGNOSIS — E878 Other disorders of electrolyte and fluid balance, not elsewhere classified: Secondary | ICD-10-CM | POA: Insufficient documentation

## 2015-12-26 DIAGNOSIS — I2690 Septic pulmonary embolism without acute cor pulmonale: Secondary | ICD-10-CM | POA: Insufficient documentation

## 2015-12-26 DIAGNOSIS — I358 Other nonrheumatic aortic valve disorders: Secondary | ICD-10-CM | POA: Insufficient documentation

## 2015-12-26 DIAGNOSIS — R7881 Bacteremia: Secondary | ICD-10-CM

## 2015-12-26 NOTE — Progress Notes (Signed)
Patient's present for hospital f/up for pain in upper abdomen area, lower back pain, described as shooting throbbing pain, rated at 8/10.  Patient also c/o possible leg length discrepancy with constant shooting, throbbing pain, rated 8/10. When she stands up and apply pressure to her R leg she begin to experience excruciating pain in her lower back, rating pain 8/10.   Patient declines diabetes screening and flu shot.

## 2015-12-26 NOTE — Progress Notes (Addendum)
ASSESSMENT: Pt currently experiencing Uncomplicated opioid dependence. Pt needs to f/u with PCP and [facility]; would benefit from outpatient management of opioid dependence; may benefit from motivational interviewing.  Stage of Change: precontemplative  PLAN: 1. F/U with behavioral health consultant in one week via phone, two weeks f/u office 2. Psychiatric Medications: none 3. Behavioral recommendation(s):   -[facility] walk-in at [address] 6am-6:30am, $26/day Suboxone [phone]) OR -[facility] walk-in by 5am, M-F, at [place] $125/week Suboxone [phone]) SUBJECTIVE: Pt. referred by Freeman Caldron, PA-C for suboxone referrals:  Pt. reports the following symptoms/concerns: Pt states she primarily wants to find suboxone clinic as uninsured, self-pay; at least one family member(sibling) also receives suboxone treatment. Pt does not have any other issues she wishes to discuss at this time.  Duration of problem: Undetermined Severity: undetermined  OBJECTIVE: Orientation & Cognition: Oriented x3. Thought processes normal and appropriate to situation. Mood: appropriate. Affect: appropriate Appearance: appropriate Risk of harm to self or others: no known risk of harm to self or others Substance use: tobacco, polysubstance Assessments administered: PHQ9: 15/ GAD7: 14  Diagnosis: Uncomplicated opioid dependence CPT Code: F11.20 -------------------------------------------- Other(s) present in the room: none  Time spent with patient in exam room: 20 minutes, 4-4:20pm    Depression screen [facility] 2/9 12/26/2015 12/26/2015  Decreased Interest 1 0  Down, Depressed, Hopeless 1 0  PHQ - 2 Score 2 0  Altered sleeping 2 -  Tired, decreased energy 2 -  Change in appetite 1 -  Feeling bad or failure about yourself  3 -  Trouble concentrating 2 -  Moving slowly or fidgety/restless 3 -  Suicidal thoughts 0 -  PHQ-9 Score 15 -    GAD 7 :  Generalized Anxiety Score 12/26/2015  Nervous, Anxious, on Edge 1  Control/stop worrying 3  Worry too much - different things 2  Trouble relaxing 3  Restless 1  Easily annoyed or irritable 3  Afraid - awful might happen 1  Total GAD 7 Score 14

## 2015-12-26 NOTE — Progress Notes (Signed)
Patient being seen at [facility] today for hospital discharge follow up.   I was given a filled prescription for Evzio (naloxone) by the [facility] Internal Medicine Teaching Service Pharmacist, Mannie Stabile, who had tried to complete a bedside delivery of the medication prior to discharge but did not make it to the patient in time. I gave the medication to Freeman Caldron, PA, who saw patient today and was successfully given her medication.   Nicoletta Ba, PharmD, BCPS, [place] [facility] [phone]

## 2015-12-26 NOTE — Patient Instructions (Signed)
Abdominal Pain, Adult Many things can cause abdominal pain. Usually, abdominal pain is not caused by a disease and will improve without treatment. It can often be observed and treated at home. Your health care provider will do a physical exam and possibly order blood tests and X-rays to help determine the seriousness of your pain. However, in many cases, more time must pass before a clear cause of the pain can be found. Before that point, your health care provider may not know if you need more testing or further treatment. HOME CARE INSTRUCTIONS Monitor your abdominal pain for any changes. The following actions may help to alleviate any discomfort you are experiencing:  Only take over-the-counter or prescription medicines as directed by your health care provider.  Do not take laxatives unless directed to do so by your health care provider.  Try a clear liquid diet (broth, tea, or water) as directed by your health care provider. Slowly move to a bland diet as tolerated. SEEK MEDICAL CARE IF:  You have unexplained abdominal pain.  You have abdominal pain associated with nausea or diarrhea.  You have pain when you urinate or have a bowel movement.  You experience abdominal pain that wakes you in the night.  You have abdominal pain that is worsened or improved by eating food.  You have abdominal pain that is worsened with eating fatty foods.  You have a fever. SEEK IMMEDIATE MEDICAL CARE IF:  Your pain does not go away within 2 hours.  You keep throwing up (vomiting).  Your pain is felt only in portions of the abdomen, such as the right side or the left lower portion of the abdomen.  You pass bloody or black tarry stools. MAKE SURE YOU:  Understand these instructions.  Will watch your condition.  Will get help right away if you are not doing well or get worse.   This information is not intended to replace advice given to you by your health care provider. Make sure you discuss  any questions you have with your health care provider.   Document Released: 07/14/2005 Document Revised: 06/25/2015 Document Reviewed: 06/13/2013 Elsevier Interactive Patient Education 2016 [place] Abuse When people abuse more than one drug or type of drug it is called polysubstance or polydrug abuse. For example, many smokers also drink alcohol. This is one form of polydrug abuse. Polydrug abuse also refers to the use of a drug to counteract an unpleasant effect produced by another drug. It may also be used to help with withdrawal from another drug. People who take stimulants may become agitated. Sometimes this agitation is countered with a tranquilizer. This helps protect against the unpleasant side effects. Polydrug abuse also refers to the use of different drugs at the same time.  Anytime drug use is interfering with normal living activities, it has become abuse. This includes problems with family and friends. Psychological dependence has developed when your mind tells you that the drug is needed. This is usually followed by physical dependence which has developed when continuing increases of drug are required to get the same feeling or "high". This is known as addiction or chemical dependency. A person's risk is much higher if there is a history of chemical dependency in the family. SIGNS OF CHEMICAL DEPENDENCY  You have been told by friends or family that drugs have become a problem.  You fight when using drugs.  You are having blackouts (not remembering what you do while using).  You feel sick from using  drugs but continue using.  You lie about use or amounts of drugs (chemicals) used.  You need chemicals to get you going.  You are suffering in work performance or in school because of drug use.  You get sick from use of drugs but continue to use anyway.  You need drugs to relate to people or feel comfortable in social situations.  You use drugs to forget  problems. "Yes" answered to any of the above signs of chemical dependency indicates there are problems. The longer the use of drugs continues, the greater the problems will become. If there is a family history of drug or alcohol use, it is best not to experiment with these drugs. Continual use leads to tolerance. After tolerance develops more of the drug is needed to get the same feeling. This is followed by addiction. With addiction, drugs become the most important part of life. It becomes more important to take drugs than participate in the other usual activities of life. This includes relating to friends and family. Addiction is followed by dependency. Dependency is a condition where drugs are now needed not just to get high, but to feel normal. Addiction cannot be cured but it can be stopped. This often requires outside help and the care of professionals. Treatment centers are listed in the yellow pages under: Cocaine, Narcotics, and Alcoholics Anonymous. Most hospitals and clinics can refer you to a specialized care center. Talk to your caregiver if you need help.   This information is not intended to replace advice given to you by your health care provider. Make sure you discuss any questions you have with your health care provider.   Document Released: 05/26/2005 Document Revised: 12/27/2011 Document Reviewed: 10/09/2014 Elsevier Interactive Patient Education 2016 [employer]. Endocarditis Endocarditis is an infection of the inner layer of the heart (endocardium) or of the heart valves. Endocarditis can cause growths inside the heart or on the heart valves. These growths can destroy heart tissue and cause heart failure over time. They can also cause stroke if they break away and form a blood clot in the brain. CAUSES  Endocarditis is caused by germs that normally live in or on your body. The germs that most commonly cause endocarditis are bacteria, but fungi can also cause endocarditis. RISK  FACTORS Risk factors include:  Having a heart defect.  Having artificial (prosthetic) heart valves.  Having an abnormal or damaged heart valve.  Having a history of endocarditis.  Having had a heart transplant. SIGNS AND SYMPTOMS Signs and symptoms may start suddenly, or they may start slowly and gradually get worse. Symptoms include:  Fever.  Chills.  Night sweats.  Muscle aches.  Fatigue.  Weakness.  Shortness of breath. Signs include:  An abnormal heart sound (murmur).  Retinal bleeding.  Bleeding under the nails of your fingers or toes.  Painless red spots on your palms.  Painful lumps in your fingertips or toes.  Swelling in your feet or ankles. DIAGNOSIS  To make a diagnosis, your health care provider may:  Perform a physical exam. During the exam he or she will listen to your heart to check for a murmur. He or she may also use a scope to check for bleeding in your retinas.  Order tests. They may include:  Blood tests to look for the germs that cause endocarditis.  An echocardiogram to create an image of your heart. TREATMENT Early treatment offers the best chance for curing endocarditis and preventing complications. Treatment depends on the  cause of the endocarditis. Treatment may include:  Antibiotic medicines. These may be given through an IV tube or taken orally.  Surgery to replace your heart valve. You may need surgery if:  The endocarditis does not respond to treatment.  You develop complications.  Your heart valve is severely damaged. HOME CARE INSTRUCTIONS  Take your antibiotic as directed by your health care provider. Finish the antibiotic even if you start to feel better.  Gradually resume your usual activities.  Let your health care provider know before you have any dental or surgical procedures. You may need to take antibiotics before the procedure.  Let all your health care providers, including your dentist, know that you  have had endocarditis.  Do not get tattoos or body piercings.  Do not use IV drugs unless it is part of your medical treatment.  Practice good oral hygiene. This includes:  Brushing and flossing regularly.  Scheduling routine dental appointments. SEEK MEDICAL CARE IF:  You have a fever.  Your symptoms do not improve.  Your symptoms get worse.  Your symptoms come back. SEEK IMMEDIATE MEDICAL CARE IF:  You have trouble breathing.  You have chest pain.  You have symptoms of stroke. These include:  Sudden weakness.  Numbness.  Confusion.  Trouble talking.  A severe headache.   This information is not intended to replace advice given to you by your health care provider. Make sure you discuss any questions you have with your health care provider.   Document Released: 10/04/2005 Document Revised: 10/25/2014 Document Reviewed: 05/21/2014 Elsevier Interactive Patient Education [employer].

## 2015-12-26 NOTE — Progress Notes (Signed)
Patient ID: Molly [name], female   DOB: 1986-05-05, 30 y.o.   MRN: [id number]   Molly [name], is a 30 y.o. female  [id number]  [id number]  DOB - 04/14/1986  Chief Complaint  Patient presents with  . Hospitalization Follow-up  . Abdominal Pain  . Back Pain        Subjective:   Molly [name] is a 30 y.o. female here today for a follow up visit from recent hospitalization for multiple complex issues as well as continued leaving AMA throughout hospital treatment.  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 She left the hospital AMA after only a few days of treatment.  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  She most recently left the hospital [facility] 12/22/2015.  She thinks she had low grade fever 1 day this week.  She denies any IV drug used since her hospitalization, but she has been taking her aunt's oxycodone by mouth.  She was supposed to pick up a prescription of Septra DS bid but has yet to pick it up or start it.  She is here today to establish as a patient to our practice.  She is also inquiring about methadone/suboxone clinics.  She had an appt this morning here and was a no-show.  She called for a later appt and was 4minutes late for her new appt time. She seems to lack insight and exhibits a fair amount of denial as to how serious her drug use and related health problems are-likely secondary to continued opiate use.  However, she does state that she needs to get into a suboxone clinic.  She has not attended 12 step meetings, but she has been provided with those resources.  Currently she denies  fever, abdominal pain, CP, palpitations, SOB.  She is having some pain in her lower back which is chronic for her but "seems worse than normal" right now.   A CT of the abdomen and pelvis had revealed some lytic lesions on the spine that are thought to be osteomyelitis but may warrant further work-up if her back pain doesn't improve as her infections begin to improve.   Neg ROS other than what is stated above.  ALLERGIES: No Known Allergies  PAST MEDICAL HISTORY: Past Medical History  Diagnosis Date  . IV drug user   . MRSA (methicillin resistant Staphylococcus aureus) infection   . Abscess of skin     buttock - most recently 2009/10    MEDICATIONS AT HOME: Prior to Admission medications   Medication Sig Start Date End Date Taking? Authorizing Provider  acetaminophen (TYLENOL) 325 MG tablet Take 2 tablets (650 mg total) by mouth every 6 (six) hours. 12/22/15  Yes Iline Oven, MD  diclofenac sodium (VOLTAREN) 1 % GEL Apply 4 g topically 4 (four) times daily. 12/22/15  Yes Iline Oven, MD  oxyCODONE (OXYCONTIN) 10 mg 12 hr tablet Take 1 tablet (10 mg total) by mouth every 12 (twelve) hours. 12/22/15  Yes Iline Oven, MD  traZODone (DESYREL) 50 MG tablet Take 1 tablet (50 mg total) by mouth at bedtime as needed for sleep. 12/22/15  Yes Iline Oven,  MD  Naloxone HCl (EVZIO) 0.4 MG/0.4ML SOAJ Inject 1 Dose as directed once. Give for overdose, dial 9-1-1, may give another dose after 2-3 minutes. AttnRichardson Landry Patient not taking: Reported on 12/26/2015 12/22/15   Iline Oven, MD  polyethylene glycol [facility] / Floria Raveling) packet Take 17 g by mouth 2 (two) times daily. Patient not taking: Reported on 12/26/2015 12/22/15   Iline Oven, MD  senna (SENOKOT) 8.6 MG TABS tablet Take 2 tablets (17.2 mg total) by mouth 2 (two) times daily. Patient not taking: Reported on 12/26/2015 12/22/15   Iline Oven, MD  sulfamethoxazole-trimethoprim (BACTRIM DS,SEPTRA DS) 800-160 MG tablet  Take 2 tablets by mouth 2 (two) times daily. Patient not taking: Reported on 12/26/2015 12/24/15   Iline Oven, MD     Objective:   Filed Vitals:   12/26/15 1515  BP: 117/78  Pulse: 93  Temp: 98 F (36.7 C)  TempSrc: Oral  Resp: 14  Height: 5\' 2"  (1.575 m)  Weight: 106 lb 12.8 oz (48.444 kg)  SpO2: 98%    Exam General appearance : Awake, alert, not in any distress. Speech Clear. Not toxic looking HEENT: Atraumatic and Normocephalic, pupils sluggish but equally reactive to light and accomodation Neck: supple, no JVD. No cervical lymphadenopathy.  Chest:Good air entry bilaterally, no added sounds  CVS: S1 S2 regular, no murmurs.  Abdomen: Bowel sounds present, Non tender and not distended with no gaurding, rigidity or rebound. Palpable splenomegaly that is mildly Tender.  Extremities: B/L Lower Ext shows no edema, both legs are warm to touch Neurology: Awake alert, and oriented X 3, CN II-XII intact, Non focal.  DTR =B.  No foot drop.  Normal gait and ambulation.  Skin:No Rash, No open tract marks or skin infections   Assessment & Plan  I reviewed her hospital notes/labs/imaging at length 1. Acute septic pulmonary embolism without acute cor pulmonale (HCC) She must get her OTC Septra and begin it immediately.  I stressed the importance of this multiple times and she expressed understanding.  She must return to the hospital immediately if she develops CP, abdominal pain, SOB, or fever.   She is to go straight from here to pick up her antibiotic prescription for Septra DS.   2. Aortic valve endocarditis See #1 - Basic metabolic panel - CBC with Differential/Platelet  3. Intravenous drug abuse, continuous I had her meet with Michaelene Song, our social worker for Suboxone/recovery/treatment options and resources.  I discussed at length with the patient the need for sobriety and ongoing addressing of her addiction.  I encouraged her to participate in 12 step recovery as it is  highly effective and a free resource. All of these health issues are precipitated by her drug abuse and addiction.  This underlying issue must be treated/addressed. Patient expressed understanding.  We also gave her Narcan that can be administered if there has been a possibility of an overdose.  A resident brought this over to [place] from the hospital because they knew the patient was going to follow-up with [place].  Stacy, the clinical pharmacist,  gave it to me to give to the patient.   4. MRSA bacteremia-see #1 - CBC with Differential/Platelet  5. Anemia, unspecified - CBC with Differential/Platelet  6. Splenic infarction -will continue to follow and will repeat CT of the abdomen in the future to ensure continued improvement.    7. Anasarca improving  8. Pain of upper abdomen secondary to splenomegaly/infarction -improving; non-acute abdomen - Basic metabolic  panel - CBC with Differential/Platelet  9. Bilateral low back pain with right-sided sciatica Likely ongoing and exacerbated by possible osteomyelitis.  She will need MRI of the lower back if her pain doesn't improve as her infections resolve.   10. Electrolyte imbalance -this was improving when she left the hospital - Basic metabolic panel  Patient have been counseled extensively about nutrition and exercise, and especially about drug use.  Return in about 2 weeks (around 01/09/2016) for f/up of endocarditis, splenic infarction, IV drug use, back pain with lytic lesiions vs osteomyeliti. and to establish with [place] as being her PCP.   The patient was given clear instructions to go to ER or return to medical center if symptoms don't improve, worsen or new problems develop. The patient verbalized understanding. The patient was told to call to get lab results if they haven't heard anything in the next week.    Freeman Caldron, PA-C [facility] [place], [place]   12/26/2015, 4:11 PM

## 2016-01-09 ENCOUNTER — Ambulatory Visit: Payer: Self-pay

## 2016-01-19 ENCOUNTER — Ambulatory Visit: Payer: Self-pay

## 2016-01-20 ENCOUNTER — Ambulatory Visit: Payer: Self-pay | Admitting: Internal Medicine

## 2016-02-02 ENCOUNTER — Telehealth: Payer: Self-pay | Admitting: Clinical

## 2016-02-02 NOTE — Telephone Encounter (Signed)
Attempt to f/u with pt; left HIPPA-compliant message to return call to [place] at [facility] at [phone]

## 2016-07-13 MED FILL — ZUBSOLV 5.7-1.4 MG TAB SL: 5.7-1.4 | 7 days supply | Qty: 15 | Fill #0

## 2016-07-18 ENCOUNTER — Emergency Department (HOSPITAL_COMMUNITY)
Admission: EM | Admit: 2016-07-18 | Discharge: 2016-07-19 | Disposition: A | Discharge status: Home / Self Care | Payer: Self-pay | Attending: Emergency Medicine | Admitting: Emergency Medicine

## 2016-07-18 ENCOUNTER — Encounter (HOSPITAL_COMMUNITY): Payer: Self-pay

## 2016-07-18 DIAGNOSIS — R140 Abdominal distension (gaseous): Secondary | ICD-10-CM | POA: Insufficient documentation

## 2016-07-18 DIAGNOSIS — M25551 Pain in right hip: Secondary | ICD-10-CM | POA: Insufficient documentation

## 2016-07-18 DIAGNOSIS — Z5321 Procedure and treatment not carried out due to patient leaving prior to being seen by health care provider: Secondary | ICD-10-CM | POA: Insufficient documentation

## 2016-07-18 DIAGNOSIS — F17210 Nicotine dependence, cigarettes, uncomplicated: Secondary | ICD-10-CM | POA: Insufficient documentation

## 2016-07-18 HISTORY — DX: Malingerer (conscious simulation): Z76.5

## 2016-07-18 LAB — URINALYSIS, ROUTINE W REFLEX MICROSCOPIC
Bilirubin Urine: NEGATIVE
Glucose, UA: NEGATIVE mg/dL
Ketones, ur: NEGATIVE mg/dL
LEUKOCYTES UA: NEGATIVE
NITRITE: NEGATIVE
PROTEIN: 30 mg/dL — AB
pH: 6 (ref 5.0–8.0)

## 2016-07-18 LAB — CBC
HCT: 26.3 % — ABNORMAL LOW (ref 36.0–46.0)
HEMOGLOBIN: 7.9 g/dL — AB (ref 12.0–15.0)
MCH: 25.1 pg — ABNORMAL LOW (ref 26.0–34.0)
MCHC: 30 g/dL (ref 30.0–36.0)
MCV: 83.5 fL (ref 78.0–100.0)
PLATELETS: 153 10*3/uL (ref 150–400)
RBC: 3.15 MIL/uL — AB (ref 3.87–5.11)
RDW: 15.3 % (ref 11.5–15.5)
WBC: 2.6 10*3/uL — AB (ref 4.0–10.5)

## 2016-07-18 LAB — URINE MICROSCOPIC-ADD ON

## 2016-07-18 LAB — COMPREHENSIVE METABOLIC PANEL
ALK PHOS: 112 U/L (ref 38–126)
ALT: 29 U/L (ref 14–54)
ANION GAP: 8 (ref 5–15)
AST: 28 U/L (ref 15–41)
Albumin: 2.9 g/dL — ABNORMAL LOW (ref 3.5–5.0)
BUN: 23 mg/dL — ABNORMAL HIGH (ref 6–20)
CALCIUM: 9 mg/dL (ref 8.9–10.3)
CO2: 28 mmol/L (ref 22–32)
CREATININE: 0.93 mg/dL (ref 0.44–1.00)
Chloride: 100 mmol/L — ABNORMAL LOW (ref 101–111)
Glucose, Bld: 111 mg/dL — ABNORMAL HIGH (ref 65–99)
Potassium: 4.2 mmol/L (ref 3.5–5.1)
Sodium: 136 mmol/L (ref 135–145)
TOTAL PROTEIN: 8.1 g/dL (ref 6.5–8.1)
Total Bilirubin: 0.3 mg/dL (ref 0.3–1.2)

## 2016-07-18 LAB — I-STAT BETA HCG BLOOD, ED (MC, WL, AP ONLY)

## 2016-07-18 LAB — LIPASE, BLOOD: Lipase: 50 U/L (ref 11–51)

## 2016-07-18 NOTE — ED Triage Notes (Signed)
Pt reports she takes "a lot" of ibuprofen for hip pain.

## 2016-07-18 NOTE — ED Notes (Signed)
Called several times for room, pt apparently in car. Visitor still sitting in waiting room.

## 2016-07-18 NOTE — ED Triage Notes (Signed)
Pt dx with splenomegaly in Feb 2017, since then abdomen has been swollen.  Pt reports did not finish antibiotic therapy by home health.  Now right hip is painful.  Pt reports she has been constipated, had BM today after suppository, small hard balls, last stool 2 days ago, normal stool, then 2 weeks ago after enema, small hard balls.  Pt reports her history is if she is constipated will have pain around right hip area, then it moves to left hip and then she will have BM, pain has been around right hip x 3 weeks.  No fevers.  Pt reports vomiting about 2 times a week when she eats a large meal.

## 2016-07-18 NOTE — ED Notes (Signed)
No answer for treatment room.  Per Herbert Spires, RN- pt has been called for and had apparently took a bag to the car and hasn't returned.

## 2016-07-19 ENCOUNTER — Encounter (HOSPITAL_COMMUNITY): Payer: Self-pay | Admitting: Emergency Medicine

## 2016-07-19 ENCOUNTER — Inpatient Hospital Stay (HOSPITAL_COMMUNITY): Payer: Self-pay

## 2016-07-19 ENCOUNTER — Inpatient Hospital Stay (HOSPITAL_COMMUNITY)
Admission: EM | Admit: 2016-07-19 | Discharge: 2016-07-21 | DRG: 540 | Disposition: A | Discharge status: Home / Self Care | Payer: Self-pay | Attending: Internal Medicine | Admitting: Internal Medicine

## 2016-07-19 ENCOUNTER — Emergency Department (HOSPITAL_COMMUNITY): Payer: Self-pay

## 2016-07-19 DIAGNOSIS — F17210 Nicotine dependence, cigarettes, uncomplicated: Secondary | ICD-10-CM | POA: Diagnosis present

## 2016-07-19 DIAGNOSIS — K5900 Constipation, unspecified: Secondary | ICD-10-CM | POA: Diagnosis present

## 2016-07-19 DIAGNOSIS — I358 Other nonrheumatic aortic valve disorders: Secondary | ICD-10-CM | POA: Diagnosis present

## 2016-07-19 DIAGNOSIS — Z8679 Personal history of other diseases of the circulatory system: Secondary | ICD-10-CM

## 2016-07-19 DIAGNOSIS — Z9119 Patient's noncompliance with other medical treatment and regimen: Secondary | ICD-10-CM

## 2016-07-19 DIAGNOSIS — Z8614 Personal history of Methicillin resistant Staphylococcus aureus infection: Secondary | ICD-10-CM

## 2016-07-19 DIAGNOSIS — R161 Splenomegaly, not elsewhere classified: Secondary | ICD-10-CM

## 2016-07-19 DIAGNOSIS — M4626 Osteomyelitis of vertebra, lumbar region: Principal | ICD-10-CM | POA: Diagnosis present

## 2016-07-19 DIAGNOSIS — D709 Neutropenia, unspecified: Secondary | ICD-10-CM

## 2016-07-19 DIAGNOSIS — D638 Anemia in other chronic diseases classified elsewhere: Secondary | ICD-10-CM | POA: Diagnosis present

## 2016-07-19 DIAGNOSIS — M4646 Discitis, unspecified, lumbar region: Secondary | ICD-10-CM | POA: Insufficient documentation

## 2016-07-19 DIAGNOSIS — M4647 Discitis, unspecified, lumbosacral region: Secondary | ICD-10-CM | POA: Diagnosis present

## 2016-07-19 DIAGNOSIS — Z91199 Patient's noncompliance with other medical treatment and regimen due to unspecified reason: Secondary | ICD-10-CM

## 2016-07-19 DIAGNOSIS — F1910 Other psychoactive substance abuse, uncomplicated: Secondary | ICD-10-CM

## 2016-07-19 DIAGNOSIS — F1120 Opioid dependence, uncomplicated: Secondary | ICD-10-CM | POA: Diagnosis present

## 2016-07-19 DIAGNOSIS — Z8619 Personal history of other infectious and parasitic diseases: Secondary | ICD-10-CM

## 2016-07-19 DIAGNOSIS — D649 Anemia, unspecified: Secondary | ICD-10-CM | POA: Diagnosis present

## 2016-07-19 DIAGNOSIS — M48061 Spinal stenosis, lumbar region without neurogenic claudication: Secondary | ICD-10-CM | POA: Diagnosis present

## 2016-07-19 DIAGNOSIS — D7381 Neutropenic splenomegaly: Secondary | ICD-10-CM | POA: Diagnosis present

## 2016-07-19 HISTORY — PX: IR GENERIC HISTORICAL: IMG1180011

## 2016-07-19 LAB — COMPREHENSIVE METABOLIC PANEL
ALBUMIN: 2.7 g/dL — AB (ref 3.5–5.0)
ALK PHOS: 100 U/L (ref 38–126)
ALT: 26 U/L (ref 14–54)
AST: 27 U/L (ref 15–41)
Anion gap: 6 (ref 5–15)
BUN: 22 mg/dL — AB (ref 6–20)
CALCIUM: 8.8 mg/dL — AB (ref 8.9–10.3)
CO2: 29 mmol/L (ref 22–32)
CREATININE: 0.84 mg/dL (ref 0.44–1.00)
Chloride: 98 mmol/L — ABNORMAL LOW (ref 101–111)
GFR calc Af Amer: >60 mL/min (ref >60)
GFR calc non Af Amer: >60 mL/min (ref >60)
GLUCOSE: 111 mg/dL — AB (ref 65–99)
Potassium: 4 mmol/L (ref 3.5–5.1)
SODIUM: 133 mmol/L — AB (ref 135–145)
Total Bilirubin: 0.4 mg/dL (ref 0.3–1.2)
Total Protein: 7.5 g/dL (ref 6.5–8.1)

## 2016-07-19 LAB — CBC
HEMATOCRIT: 26 % — AB (ref 36.0–46.0)
HEMOGLOBIN: 7.8 g/dL — AB (ref 12.0–15.0)
MCH: 25 pg — ABNORMAL LOW (ref 26.0–34.0)
MCHC: 30 g/dL (ref 30.0–36.0)
MCV: 83.3 fL (ref 78.0–100.0)
Platelets: 139 10*3/uL — ABNORMAL LOW (ref 150–400)
RBC: 3.12 MIL/uL — AB (ref 3.87–5.11)
RDW: 15.2 % (ref 11.5–15.5)
WBC: 2.5 10*3/uL — ABNORMAL LOW (ref 4.0–10.5)

## 2016-07-19 LAB — PROTIME-INR
INR: 1.05
Prothrombin Time: 13.7 seconds (ref 11.4–15.2)

## 2016-07-19 LAB — LIPASE, BLOOD: Lipase: 35 U/L (ref 11–51)

## 2016-07-19 MED ORDER — MIDAZOLAM HCL 2 MG/2ML IJ SOLN
INTRAMUSCULAR | Status: AC
  Filled 2016-07-19: qty 2

## 2016-07-19 MED ORDER — NICOTINE 21 MG/24HR TD PT24
21.0000 mg | MEDICATED_PATCH | Freq: Every day | TRANSDERMAL | Status: DC
  Administered 2016-07-19 – 2016-07-21 (×3): 21 mg via TRANSDERMAL
  Filled 2016-07-19 (×3): qty 1

## 2016-07-19 MED ORDER — IBUPROFEN 200 MG PO TABS
600.0000 mg | ORAL_TABLET | Freq: Four times a day (QID) | ORAL | Status: DC | PRN
  Administered 2016-07-19 – 2016-07-21 (×5): 600 mg via ORAL
  Filled 2016-07-19 (×5): qty 3

## 2016-07-19 MED ORDER — SODIUM CHLORIDE 0.9 % IV BOLUS (SEPSIS)
1000.0000 mL | Freq: Once | INTRAVENOUS | Status: AC
  Administered 2016-07-19: 1000 mL via INTRAVENOUS

## 2016-07-19 MED ORDER — IOPAMIDOL (ISOVUE-300) INJECTION 61%
INTRAVENOUS | Status: AC
  Filled 2016-07-19: qty 30

## 2016-07-19 MED ORDER — IBUPROFEN 400 MG PO TABS
600.0000 mg | ORAL_TABLET | Freq: Once | ORAL | Status: AC
  Administered 2016-07-19: 600 mg via ORAL

## 2016-07-19 MED ORDER — IOPAMIDOL (ISOVUE-300) INJECTION 61%
INTRAVENOUS | Status: AC
  Administered 2016-07-19: 75 mL via INTRAVENOUS
  Filled 2016-07-19: qty 75

## 2016-07-19 MED ORDER — FLUTICASONE PROPIONATE 50 MCG/ACT NA SUSP
2.0000 | Freq: Every day | NASAL | Status: DC
  Administered 2016-07-19: 2 via NASAL
  Filled 2016-07-19: qty 16

## 2016-07-19 MED ORDER — IBUPROFEN 400 MG PO TABS
600.0000 mg | ORAL_TABLET | Freq: Once | ORAL | Status: AC
  Administered 2016-07-19: 600 mg via ORAL
  Filled 2016-07-19: qty 1

## 2016-07-19 MED ORDER — BUPRENORPHINE HCL-NALOXONE HCL 5.7-1.4 MG SL SUBL: 1.0000 | SUBLINGUAL_TABLET | Freq: Two times a day (BID) | SUBLINGUAL | Status: DC

## 2016-07-19 MED ORDER — ACETAMINOPHEN 650 MG RE SUPP: 650.0000 mg | Freq: Four times a day (QID) | RECTAL | Status: DC | PRN

## 2016-07-19 MED ORDER — FENTANYL CITRATE (PF) 100 MCG/2ML IJ SOLN
INTRAMUSCULAR | Status: AC | PRN
  Administered 2016-07-19 (×2): 50 ug via INTRAVENOUS

## 2016-07-19 MED ORDER — SENNOSIDES-DOCUSATE SODIUM 8.6-50 MG PO TABS
1.0000 | ORAL_TABLET | Freq: Every day | ORAL | Status: DC
  Administered 2016-07-19 – 2016-07-20 (×2): 1 via ORAL
  Filled 2016-07-19 (×3): qty 1

## 2016-07-19 MED ORDER — HEPARIN SODIUM (PORCINE) 5000 UNIT/ML IJ SOLN
5000.0000 [IU] | Freq: Three times a day (TID) | INTRAMUSCULAR | Status: DC
  Administered 2016-07-20 – 2016-07-21 (×5): 5000 [IU] via SUBCUTANEOUS
  Filled 2016-07-19 (×5): qty 1

## 2016-07-19 MED ORDER — FENTANYL CITRATE (PF) 100 MCG/2ML IJ SOLN
INTRAMUSCULAR | Status: AC
  Filled 2016-07-19: qty 2

## 2016-07-19 MED ORDER — MIDAZOLAM HCL 2 MG/2ML IJ SOLN
INTRAMUSCULAR | Status: AC | PRN
  Administered 2016-07-19 (×2): 1 mg via INTRAVENOUS

## 2016-07-19 MED ORDER — LIDOCAINE HCL 1 % IJ SOLN
INTRAMUSCULAR | Status: AC
  Filled 2016-07-19: qty 20

## 2016-07-19 MED ORDER — SODIUM CHLORIDE 0.9 % IV SOLN
8.0000 mg/kg | INTRAVENOUS | Status: DC
  Administered 2016-07-19 – 2016-07-20 (×2): 372 mg via INTRAVENOUS
  Filled 2016-07-19 (×3): qty 7.44

## 2016-07-19 MED ORDER — ACETAMINOPHEN 325 MG PO TABS: 650.0000 mg | ORAL_TABLET | Freq: Four times a day (QID) | ORAL | Status: DC | PRN

## 2016-07-19 MED ORDER — LIDOCAINE HCL 1 % IJ SOLN
INTRAMUSCULAR | Status: DC | PRN
  Administered 2016-07-19: 15 mL

## 2016-07-19 NOTE — ED Notes (Addendum)
Patient transported to X-ray 

## 2016-07-19 NOTE — Progress Notes (Signed)
      INFECTIOUS DISEASE ATTENDING ADDENDUM:   Date: 07/19/2016  Patient name: Molly [name]  Medical record number: [id number]  Date of birth: [birth year]   Patient with severe vertebral osteo and diskitis  PLEASE DO NOT GIVE ANTIBIOTICS UNTIL WE CAN GET A SPECIMEN FROM DISK SPACE + PARAVERTEBRAL ABSCESS  Typically IR can do this if she does not need NEUROSURGERY  I will see the patient in formal consultation after admission to the floor.  Rhina Brackett [place] 07/19/2016, 10:01 AM

## 2016-07-19 NOTE — ED Notes (Signed)
Patient back from IR and going upstairs. Very drowsy and reporting pain at a 9 right now. No O2 needed, and family at the bedside.

## 2016-07-19 NOTE — Progress Notes (Signed)
Pharmacy Antibiotic Note  Molly [name] is a 30 y.o. female admitted on 07/19/2016 with osteo.  Pharmacy has been consulted for daptomycin dosing.  Plan: Daptomycin 8 mg/kg q24h Weekly CK Monitor cx, renal fx, id recs  Height: 5\' 1"  (154.9 cm) Weight: 102 lb 9.6 oz (46.5 kg) IBW/kg (Calculated) : 47.8  Temp (24hrs), Avg:98.4 F (36.9 C), Min:98.2 F (36.8 C), Max:98.9 F (37.2 C)   Recent Labs Lab 07/18/16 2236 07/19/16 0825  WBC 2.6* 2.5*  CREATININE 0.93 0.84    Estimated Creatinine Clearance: 72.5 mL/min (by C-G formula based on SCr of 0.84 mg/dL).    No Known Allergies  Levester Fresh, PharmD, BCPS, [facility] Clinical Pharmacist Pager [phone] 07/19/2016 7:46 PM

## 2016-07-19 NOTE — ED Provider Notes (Signed)
  Physical Exam  BP 110/77 (BP Location: Right Arm)   Pulse 86   Temp 98.3 F (36.8 C) (Oral)   Resp 18   Ht 5\' 1"  (1.549 m)   Wt 46.5 kg   SpO2 100%   BMI 19.39 kg/m   Physical Exam  Constitutional: She is oriented to person, place, and time. Vital signs are normal. She appears well-developed and well-nourished.  HENT:  Head: Normocephalic.  Right Ear: Hearing normal.  Left Ear: Hearing normal.  Eyes: Conjunctivae and EOM are normal. Pupils are equal, round, and reactive to light.  Neck: Normal range of motion. Neck supple.  Cardiovascular: Normal rate, regular rhythm, normal heart sounds and intact distal pulses.   Pulmonary/Chest: Effort normal and breath sounds normal.  Abdominal: Soft. She exhibits mass. There is tenderness in the left upper quadrant.  Neurological: She is alert and oriented to person, place, and time. She has normal reflexes. No cranial nerve deficit or sensory deficit.  No sensory or motor deficits BLE  Skin: Skin is warm and dry.  Psychiatric: She has a normal mood and affect. Her speech is normal and behavior is normal. Thought content normal.  Nursing note and vitals reviewed.  ED Course  Procedures  MDM 7:34 AM- Sign out from [name], [place]  Per previous HPI: "Patient presents with complaint of abdominal pain and swelling with history of splenomegaly (11/2015). She states the swelling is worse over the last 2 days. No fever or vomiting. She continues to pass gas but states only very small bowel movements sporadically over the last week. No vomiting or fever. She is also having significant right posterior hip pain that started 3 weeks ago without injury."  Pending CT Scan for Obstruction and to Eval Spleen  9:12 AM CT Abdomen/Pelvis  Impression: 1. Progressive changes throughout the lower thoracic and lumbar spine consistent with multifocal infectious discitis/osteomyelitis, most notable at L1-2 where there is now complete disc  collapse, paravertebral phlegmon, likely 2.3 cm paravertebral abscess, and ventral epidural phlegmon resulting in moderate spinal stenosis. Consider further evaluation with MRI. 2. Unchanged, marked splenomegaly. Contraction of chronic splenic infarct. 3. Moderate amount of colonic stool.  WBC 2.5. Afebrile.   She has hx of MRSA aortic valve endocarditis in February. DCed with month Bactrim at that time. Currently not on antibiotics. Used IV drugs x 2 days ago.    9:38 AM Consult to Infectious Disease [facility]. Withhold ABX until IR biopsy abscess. Will follow along.  9:47 AM- Consulted IR. Will see patient for drainage of paravertebral abscess and culture.   10:19 AM Will admit to medicine teaching service     Shary Decamp, PA-C 07/19/16 [place], MD 07/19/16 [phone]

## 2016-07-19 NOTE — H&P (Signed)
Date: 07/19/2016               Patient Name:  Molly [name] MRN: [id number]  DOB: 01/19/1986 Age / Sex: 30 y.o., female   PCP: No Pcp Per Patient         Medical Service: Internal Medicine Teaching Service         Attending Physician: Dr. Sid Falcon, MD    First Contact: Dr. Reesa Chew Pager: [phone]  Second Contact: Dr. Quay Burow Pager: [phone]       After Hours (After 5p/  First Contact Pager: [phone]  weekends / holidays): Second Contact Pager: [phone]   Chief Complaint: Constipation   History of Present Illness: 30 year old female with past medical history of IV drug use, MRSA valve endocarditis, osteomyelitis of thoracolumbosacral spine, and splenic infarction who presents with constipation. For past 3 weeks her abdomen has become more distended and she has noticed right-sided back pain and right leg pain. She states that her last good bowel movement was 3 weeks ago and then she used a suppository last night as she has not had a bowel movement in 1 week. She was able to have a small bowel movement after the suppository w/o relief in abd pain. She then presented to the emergency department this morning due to abd pain. She does not use a daily laxative. She is on Suboxone and had an appointment with the Suboxone clinic today. She also reports for the past 3 weeks she has had increasing right hip and right leg pain associated with weakness. She denies lower extremity numbness, falls, saddle anesthesia, and urinary incontinence.  Patient was last admitted to [facility] from February 28 to 12/22/2015 for MRSA endocarditis and osteomyelitis. She was initially managed with IV daptomycin and was supposed to receive a single dose of Oritavancin on day of discharge and then a dose of dalbavancin 1500mg  IV once on 3/27. However she left AMA before she could complete her dose of ortiavancin and her dose of dalbavancin was cancelled 2/2 non adherence. She was also suppose to be bactrim DS 2 tabs BID  for one month however when she saw her PCP at [facility] and 4 days following discharge she was not taking it.   In the emergency department a CT of abdomen and pelvis revealed multifocal infectious discitis/osteomyelitis at L1-2 with complete disc, and a 2.3 cm paravertebral abscess and ventral epidural phlegmon resulting in moderate spinal stenosis as well as a moderate amount of colonic stool. IR was consulted by the ED for drainage of the abscess. ID was consulted and recommended to hold abx until IR can aspirate abscess.   Meds:  Current Meds  Medication Sig  . Buprenorphine HCl-Naloxone HCl (ZUBSOLV) 5.7-1.4 MG SUBL Place 1 tablet under the tongue every 12 (twelve) hours.  Marland Kitchen ibuprofen (ADVIL,MOTRIN) 200 MG tablet Take 600 mg by mouth every 6 (six) hours as needed for moderate pain.  . traZODone (DESYREL) 50 MG tablet Take 1 tablet (50 mg total) by mouth at bedtime as needed for sleep.     Allergies: Allergies as of 07/19/2016  . (No Known Allergies)   Past Medical History:  Diagnosis Date  . Abscess of skin    buttock - most recently 2009/10  . Drug-seeking behavior   . IV drug user   . MRSA (methicillin resistant Staphylococcus aureus) infection     Family History:  Family History  Problem Relation Age of Onset  . Alcoholism Father  Social History:  Social History   Social History  . Marital status: Single    Spouse name: N/A  . Number of children: N/A  . Years of education: N/A   Occupational History  . Not on file.   Social History Main Topics  . Smoking status: Current Every Day Smoker    Packs/day: 1.00    Types: Cigarettes  . Smokeless tobacco: Never Used  . Alcohol use No  . Drug use:     Types: IV, Oxycodone     Comment: opiods.  Started Suboxone 07-13-16  . Sexual activity: Yes    Birth control/ protection: IUD   Other Topics Concern  . Not on file   Social History Narrative  . No narrative on file     Review of Systems: A complete  ROS was negative except as per HPI. Denies fevers, NS, chills, and dysuria. Positive for abd pain that is non radiating, constipation, rt hip/leg pain.   Physical Exam: Blood pressure 96/71, pulse 73, temperature 98.2 F (36.8 C), temperature source Oral, resp. rate 16, height 5\' 1"  (1.549 m), weight 102 lb 9.6 oz (46.5 kg), SpO2 98 %. Physical Exam  Constitutional: She appears well-developed.  Thin   Cardiovascular: Normal rate and regular rhythm.  Exam reveals no gallop and no friction rub.   No murmur heard. Pulmonary/Chest: Effort normal and breath sounds normal. No respiratory distress. She has no wheezes. She has no rales.  Abdominal: Bowel sounds are normal. She exhibits distension. There is tenderness. There is no rebound and no guarding.  Musculoskeletal: She exhibits no edema.  Neurological:  5/5 LLE strength, 4+/5 RLE strength  Skin: Skin is warm and dry. No rash noted. No erythema. No pallor.     CT abd/pelvis: 1. Progressive changes throughout the lower thoracic and lumbar spine consistent with multifocal infectious discitis/osteomyelitis, most notable at L1-2 where there is now complete disc collapse, paravertebral phlegmon, likely 2.3 cm paravertebral abscess, and ventral epidural phlegmon resulting in moderate spinal stenosis. Consider further evaluation with MRI. 2. Unchanged, marked splenomegaly. Contraction of chronic splenic infarct. 3. Moderate amount of colonic stool.  Assessment & Plan by Problem: Active Problems:   Discitis   Discitis of lumbar region  Discitis-- pt has left AMA numerous times w/o completing full course of abx for her endocarditis. She recently used IV drugs 2 days ago. Dx w/ osteomyelitis of spine on 2/28 on CT abd/ pelvis that noted new multiple subcentimeter endplate lytic lesions in all lumbar vertebra, S1 and T10, T12. She now has Progressive changes throughout the lower thoracic and lumbar spine consistent with multifocal infectious  discitis/osteomyelitis, most notable at L1-2 where there is now complete disc collapse, paravertebral phlegmon, likely 2.3 cm paravertebral abscess, and ventral epidural phlegmon resulting in moderate spinal stenosis. She denies any cauda equina sx.  - checking HIV and Hep C - ID following, appreciate their recommendations. - IR consulted and will aspirate abscess today - ibuprofen 600mg  q6h prn for pain - blood cultures pending  MRSA endocarditis-- hx of AV endocarditis in 10/2015 at [facility] During last admission in March repeat TEE was negative for vegetations however she has been using IV drugs in the interim. She was tx with IV daptomycin. Vancomycin was avoided due to her acute kidney failure, anemia, and thrombocytopenia. Her last admission in 12/2015 she left AMA and only received 10% of her oritavancin dose.  - abx as above - consider TTE  Constipation-- there was moderate amt of  stool noted on CT abd/pelvis likely 2/2 IVDU and suboxone  - soap suds enema - senokot   Neutropenia and anemia-- chronic, likely multifactorial due to splenomegaly and anemia of chronic disease. Last anemia panel was in 2014 w/ low iron and low TIBC. Her hgb is at b/l, 7.5-8. Splenomegaly unchanged from prior CT. - CBC in the am.   Opioid addiction-- - reordered home suboxone  5.7-1.4 mg BID.  Code: full Diet: reg DVT: holding DVT ppx until after IR procedure today. SCDs     Dispo: Admit patient to Inpatient with expected length of stay greater than 2 midnights.  Signed: Norman Herrlich, MD 07/19/2016, 11:02 AM  Pager: [phone]

## 2016-07-19 NOTE — ED Notes (Signed)
Prior nurse to administer motrin however patient currently sleeping. Family member at bedside. Will attempt after patient rests.

## 2016-07-19 NOTE — ED Provider Notes (Signed)
[facility] DEPT Provider Note   CSN: [id number] Arrival date & time: 07/19/16  0030     History   Chief Complaint Chief Complaint  Patient presents with  . Constipation  . Hip Pain    HPI Molly [name] is a 30 y.o. female.  Patient presents with complaint of abdominal pain and swelling with history of splenomegaly (11/2015). She states the swelling is worse over the last 2 days. No fever or vomiting. She continues to pass gas but states only very small bowel movements sporadically over the last week. No vomiting or fever. She is also having significant right posterior hip pain that started 3 weeks ago without injury.   The history is provided by the patient. No language interpreter was used.  Constipation   Associated symptoms include abdominal pain. Pertinent negatives include no dysuria.  Hip Pain  Associated symptoms include abdominal pain. Pertinent negatives include no chest pain and no shortness of breath.    Past Medical History:  Diagnosis Date  . Abscess of skin    buttock - most recently 2009/10  . Drug-seeking behavior   . IV drug user   . MRSA (methicillin resistant Staphylococcus aureus) infection     Patient Active Problem List   Diagnosis Date Noted  . Subacute bacterial endocarditis   . Splenomegaly   . Constipation   . Absolute anemia   . Osteomyelitis of spine ([place])   . Pain of upper abdomen   . Polysubstance abuse   . Palliative care encounter   . Cocaine use 11/28/2015  . 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 Pancytopenia ([place]) 11/27/2015  . AKI (acute kidney injury) ([place]) 11/27/2015  . Anasarca 11/27/2015  . Anemia, unspecified 07/04/2013  . Pulmonary HTN ([place]) 07/04/2013  . 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 Septic pulmonary embolism ([place]) 07/03/2013  . Intravenous drug abuse, continuous 07/03/2013  . MRSA bacteremia 07/03/2013    Past Surgical History:  Procedure Laterality Date    . TEE WITHOUT CARDIOVERSION N/A 07/04/2013   Procedure: TRANSESOPHAGEAL ECHOCARDIOGRAM (TEE);  Surgeon: Larey Dresser, MD;  [facility] ENDOSCOPY;  Service: Cardiovascular;  Laterality: N/A;  . TONSILLECTOMY     30 years old    OB History    No data available       Home Medications    Prior to Admission medications   Medication Sig Start Date End Date Taking? Authorizing Provider  acetaminophen (TYLENOL) 325 MG tablet Take 2 tablets (650 mg total) by mouth every 6 (six) hours. 12/22/15   Iline Oven, MD  diclofenac sodium (VOLTAREN) 1 % GEL Apply 4 g topically 4 (four) times daily. 12/22/15   Iline Oven, MD  Naloxone HCl (EVZIO) 0.4 MG/0.4ML SOAJ Inject 1 Dose as directed once. Give for overdose, dial 9-1-1, may give another dose after 2-3 minutes. AttnRichardson Landry Patient not taking: Reported on 12/26/2015 12/22/15   Iline Oven, MD  oxyCODONE (OXYCONTIN) 10 mg 12 hr tablet Take 1 tablet (10 mg total) by mouth every 12 (twelve) hours. 12/22/15   Iline Oven, MD  polyethylene glycol [facility] / Floria Raveling) packet Take 17 g by mouth 2 (two) times daily. Patient not taking: Reported on 12/26/2015 12/22/15   Iline Oven, MD  senna (SENOKOT) 8.6 MG TABS tablet Take 2 tablets (17.2 mg total) by mouth 2 (two) times daily. Patient not taking: Reported on 12/26/2015 12/22/15   Iline Oven, MD  sulfamethoxazole-trimethoprim (BACTRIM DS,SEPTRA  DS) 800-160 MG tablet Take 2 tablets by mouth 2 (two) times daily. Patient not taking: Reported on 12/26/2015 12/24/15   Iline Oven, MD  traZODone (DESYREL) 50 MG tablet Take 1 tablet (50 mg total) by mouth at bedtime as needed for sleep. 12/22/15   Iline Oven, MD    Family History Family History  Problem Relation Age of Onset  . Alcoholism Father     Social History Social History  Substance Use Topics  . Smoking status: Current Every Day Smoker    Packs/day: 1.00    Types: Cigarettes  . Smokeless tobacco: Never Used   . Alcohol use No     Allergies   Review of patient's allergies indicates no known allergies.   Review of Systems Review of Systems  Constitutional: Negative for chills and fever.  HENT: Negative.   Respiratory: Negative.  Negative for shortness of breath.   Cardiovascular: Negative.  Negative for chest pain.  Gastrointestinal: Positive for abdominal distention, abdominal pain and constipation. Negative for vomiting.  Genitourinary: Negative.  Negative for dysuria.  Musculoskeletal:       See HPI.  Skin: Negative.  Negative for color change and rash.  Neurological: Negative.  Negative for weakness.     Physical Exam Updated Vital Signs BP 110/77 (BP Location: Right Arm)   Pulse 86   Temp 98.3 F (36.8 C) (Oral)   Resp 18   Ht 5\' 1"  (1.549 m)   Wt 46.5 kg   SpO2 100%   BMI 19.39 kg/m   Physical Exam  Constitutional: She is oriented to person, place, and time. She appears well-developed and well-nourished.  Thin, ill appearing  HENT:  Head: Normocephalic.  Eyes: Pupils are equal, round, and reactive to light. No scleral icterus.  Neck: Normal range of motion. Neck supple.  Cardiovascular: Normal rate and regular rhythm.   Pulmonary/Chest: Effort normal and breath sounds normal. She has no wheezes. She has no rales.  Abdominal: Soft. Bowel sounds are normal. She exhibits mass (Left abdominal mass mid-abdomen). She exhibits no distension. There is tenderness (Diffuse abdominal tenderness.). There is no rebound and no guarding.  Musculoskeletal: Normal range of motion.  There is tenderness to right lower paralumbar area without midline spinal tenderness.   Neurological: She is alert and oriented to person, place, and time.  Skin: Skin is warm and dry. No rash noted.  Psychiatric: She has a normal mood and affect.     ED Treatments / Results  Labs (all labs ordered are listed, but only abnormal results are displayed) Labs Reviewed - No data to display  EKG  EKG  Interpretation None       Radiology Dg Hip Unilat W Or Wo Pelvis 1 View Right  Result Date: 07/19/2016 CLINICAL DATA:  Right hip pain.  No reported injury. EXAM: DG HIP (WITH OR WITHOUT PELVIS) 1V RIGHT COMPARISON:  None. FINDINGS: There is no evidence of hip fracture or dislocation. There is no evidence of arthropathy or other focal bone abnormality. Intrauterine device. IMPRESSION: Negative. Electronically Signed   By: Lucienne Capers M.D.   On: 07/19/2016 05:03    Procedures Procedures (including critical care time)  Medications Ordered in ED Medications  iopamidol (ISOVUE-300) 61 % injection (not administered)  ibuprofen (ADVIL,MOTRIN) tablet 600 mg (600 mg Oral Given 07/19/16 0608)     Initial Impression / Assessment and Plan / ED Course  I have reviewed the triage vital signs and the nursing notes.  Pertinent labs & imaging  results that were available during my care of the patient were reviewed by me and considered in my medical decision making (see chart for details).  Clinical Course    Patient presents with worsening abdominal swelling and pain with known history of splenomegaly. She is also having significant right low back pain without known injury.   Patient care is signed out at end of shift to Shary Decamp, PA-C, and Dr. Duffy Bruce pending CT scan of abdomen.  Final Clinical Impressions(s) / ED Diagnoses   Final diagnoses:  None  1. Right hip pain 2. Abdominal pain and distention  New Prescriptions New Prescriptions   No medications on file     Charlann Lange, Hershal Coria 07/19/16 [place]    Quintella Reichert, MD 07/20/16 [phone]

## 2016-07-19 NOTE — ED Triage Notes (Signed)
Pt. reports chronic right hip pain for 3 months and constipation for several days , denies hip injury , ambulatory .

## 2016-07-19 NOTE — ED Notes (Signed)
Pt has been called multiple times with no answer.  Tori, RN said visitor was in waiting room 30 min after pt was still unable to be located.  Called pt's phone number and no answer

## 2016-07-19 NOTE — Procedures (Signed)
FLuoro guided disc aspiration T12-L1  28ml bloody fluid No complication No blood loss. See complete dictation in [facility].

## 2016-07-19 NOTE — ED Notes (Signed)
Pt will not keep monitor on. RN has spoken to patient and tried to hook her up several times

## 2016-07-19 NOTE — ED Notes (Signed)
Patient went to CT

## 2016-07-19 NOTE — Consult Note (Signed)
Chief Complaint: Patient was seen in consultation today for image guided L 1-2 disc space aspiration Chief Complaint  Patient presents with  . Constipation  . Hip Pain    Referring Physician(s): Joneen Caraway  Supervising Physician: Arne Cleveland  Patient Status: Inpatient  History of Present Illness: Molly [name] is a 30 y.o. 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 She was treated with one month of Bactrim. She presents to the ED today with persistent low back and right hip pain. CT of the abdomen and pelvis has revealed progressive changes throughout the lower thoracic and lumbar spine consistent with multifocal infectious discitis/osteomyelitis most notable at L1-2 where there is now complete disc collapse, paravertebral phlegmon and likely 2.3 cm paravertebral abscess and ventral epidural phlegmon resulting in moderate spinal stenosis. She also has marked but unchanged splenomegaly. Request now received from infectious disease for image guided aspiration of the L1-2 disc space for further evaluation prior to antibiotic therapy.  Past Medical History:  Diagnosis Date  . Abscess of skin    buttock - most recently 2009/10  . Drug-seeking behavior   . IV drug user   . MRSA (methicillin resistant Staphylococcus aureus) infection     Past Surgical History:  Procedure Laterality Date  . TEE WITHOUT CARDIOVERSION N/A 07/04/2013   Procedure: TRANSESOPHAGEAL ECHOCARDIOGRAM (TEE);  Surgeon: Larey Dresser, MD;  Location: [facility] ENDOSCOPY;  Service: Cardiovascular;  Laterality: N/A;  . TONSILLECTOMY     30 years old    Allergies: Review of patient's allergies indicates no known allergies.  Medications: Prior to Admission medications   Medication Sig Start Date End Date Taking? Authorizing Provider  Buprenorphine HCl-Naloxone HCl (ZUBSOLV) 5.7-1.4 MG SUBL Place 1 tablet under the tongue every 12 (twelve) hours.   Yes  Historical Provider, MD  ibuprofen (ADVIL,MOTRIN) 200 MG tablet Take 600 mg by mouth every 6 (six) hours as needed for moderate pain.   Yes Historical Provider, MD  traZODone (DESYREL) 50 MG tablet Take 1 tablet (50 mg total) by mouth at bedtime as needed for sleep. 12/22/15  Yes Iline Oven, MD     Family History  Problem Relation Age of Onset  . Alcoholism Father     Social History   Social History  . Marital status: Single    Spouse name: N/A  . Number of children: N/A  . Years of education: N/A   Social History Main Topics  . Smoking status: Current Every Day Smoker    Packs/day: 1.00    Types: Cigarettes  . Smokeless tobacco: Never Used  . Alcohol use No  . Drug use:     Types: IV, Oxycodone     Comment: opiods.  Started Suboxone 07-13-16  . Sexual activity: Yes    Birth control/ protection: IUD   Other Topics Concern  . None   Social History Narrative  . None      Review of Systems  she denies fever, chest pain or abnormal bleeding; does have headache, some dyspnea with exertion, occasional cough, abdominal/back pain, recent nausea and vomiting, and constipation Vital Signs: BP 96/71   Pulse 73   Temp 98.2 F (36.8 C) (Oral)   Resp 16   Ht 5\' 1"  (1.549 m)   Wt 102 lb 9.6 oz (46.5 kg)   SpO2 98%   BMI 19.39 kg/m   Physical Exam patient awake, alert. Chest clear to auscultation bilaterally. Heart with regular  rate and rhythm. Abdomen distended, positive bowel sounds, mild generalized tenderness, splenomegaly; lower extremities with no edema; paravertebral tenderness mid to lower back region  Mallampati Score:     Imaging: Ct Abdomen Pelvis W Contrast  Result Date: 07/19/2016 CLINICAL DATA:  History of splenomegaly. Evaluation for acute change. Right hip pain and constipation. History of IV drug use. EXAM: CT ABDOMEN AND PELVIS WITH CONTRAST TECHNIQUE: Multidetector CT imaging of the abdomen and pelvis was performed using the standard protocol  following bolus administration of intravenous contrast. CONTRAST:  75 mL Isovue-300 COMPARISON:  12/16/2015 FINDINGS: Lower chest: Minimal atelectasis in the lung bases. The heart is normal in size. Pericardial effusion has resolved. No pleural effusion. Hepatobiliary: Minimal periportal edema, less than on the prior study. No focal liver abnormality identified. Gallbladder is unremarkable. No biliary dilatation. Pancreas: Unremarkable. Spleen: Marked splenic enlargement is similar to the prior study, measuring approximately 20 cm in craniocaudal length. Band of hypoattenuation in the spleen has decreased in size from the prior CT, compatible with a now chronic infarct. No new focal splenic abnormality is identified. Adrenals/Urinary Tract: Similar appearance of adrenal gland thickening. The kidneys and bladder are unremarkable. Stomach/Bowel: The stomach is within normal limits. Oral contrast is present in multiple nondilated loops of small bowel without evidence of obstruction. Proximal small bowel loops are displaced centrally/ rightward in the abdomen due to the marked splenomegaly. The appendix is unremarkable. There is a moderate amount of stool in the colon. Vascular/Lymphatic: The abdominal aorta is normal in caliber. Mild retroperitoneal lymphadenopathy is stable to slightly increased. Reproductive: An intrauterine contraceptive device remains in place. No pelvic mass identified. Other: Trace pelvic free fluid. Musculoskeletal: Lytic lesion involving the T10 superior endplate is unchanged. There is mildly progressive disc space height loss with multiple small areas of endplate erosion at O11-57. Erosive endplate changes at W62-M3 have greatly progressed with progressive disc space height loss. Erosive endplate changes at T5-9 have also greatly progressed, now with complete disc space collapse. There is diffuse phlegmonous change in the paravertebral soft tissues, right greater than left and centered at the  L1-2 level. 2.3 cm more focal region of low density in the right sided paravertebral soft tissues at L1-2 may represent developing abscess. There is moderate spinal stenosis at L1-2 due to ventral epidural material which may reflect displaced disc material and infectious phlegmon. Erosive endplate changes have mildly progressed at L3-4 with new mild disc space height loss. L4-5 disc space height is preserved, with a small erosion in the L5 superior endplate having improved in the interim. Erosive endplate changes at R4-B6 have mildly progressed. IMPRESSION: 1. Progressive changes throughout the lower thoracic and lumbar spine consistent with multifocal infectious discitis/osteomyelitis, most notable at L1-2 where there is now complete disc collapse, paravertebral phlegmon, likely 2.3 cm paravertebral abscess, and ventral epidural phlegmon resulting in moderate spinal stenosis. Consider further evaluation with MRI. 2. Unchanged, marked splenomegaly. Contraction of chronic splenic infarct. 3. Moderate amount of colonic stool. Electronically Signed   By: Logan Bores M.D.   On: 07/19/2016 08:55   Dg Hip Unilat W Or Wo Pelvis 1 View Right  Result Date: 07/19/2016 CLINICAL DATA:  Right hip pain.  No reported injury. EXAM: DG HIP (WITH OR WITHOUT PELVIS) 1V RIGHT COMPARISON:  None. FINDINGS: There is no evidence of hip fracture or dislocation. There is no evidence of arthropathy or other focal bone abnormality. Intrauterine device. IMPRESSION: Negative. Electronically Signed   By: Lucienne Capers M.D.   On:  07/19/2016 05:03    Labs:  CBC:  Recent Labs  12/18/15 0919 12/20/15 0537 07/18/16 2236 07/19/16 0825  WBC 3.3* 2.9* 2.6* 2.5*  HGB 8.4* 8.2* 7.9* 7.8*  HCT 25.0* 27.0* 26.3* 26.0*  PLT 184 208 153 139*    COAGS:  Recent Labs  11/27/15 1644 11/27/15 2247 11/28/15 0839 12/16/15 2105  INR 1.19  --  1.23 1.14  APTT  --  88*  --  106*    BMP:  Recent Labs  12/18/15 0919  12/20/15 0537 07/18/16 2236 07/19/16 0825  NA 139 140 136 133*  K 3.2* 3.7 4.2 4.0  CL 107 108 100* 98*  CO2 23 23 28 29   GLUCOSE 107* 96 111* 111*  BUN 15 19 23* 22*  CALCIUM 8.8* 8.8* 9.0 8.8*  CREATININE 1.11* 1.12* 0.93 0.84  GFRNONAA >60 >60 >60 >60  GFRAA >60 >60 >60 >60    LIVER FUNCTION TESTS:  Recent Labs  11/28/15 0831 12/16/15 1837 07/18/16 2236 07/19/16 0825  BILITOT 0.3 0.2* 0.3 0.4  AST 15 18 28 27   ALT 6* 13* 29 26  ALKPHOS 72 119 112 100  PROT 6.9 8.2* 8.1 7.5  ALBUMIN 2.3* 3.0* 2.9* 2.7*    TUMOR MARKERS: No results for input(s): AFPTM, CEA, CA199, CHROMGRNA in the last 8760 hours.  Assessment and Plan: 31 y.o. 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 She was treated with one month of Bactrim. She presents to the ED today with persistent low back and right hip pain. CT of the abdomen and pelvis has revealed progressive changes throughout the lower thoracic and lumbar spine consistent with multifocal infectious discitis/osteomyelitis most notable at L1-2 where there is now complete disc collapse, paravertebral phlegmon and likely 2.3 cm paravertebral abscess and ventral epidural phlegmon resulting in moderate spinal stenosis. She also has marked but unchanged splenomegaly. Request now received from infectious disease for image guided aspiration of the L1-2 disc space for further evaluation prior to antibiotic therapy. Imaging studies have been reviewed by Dr. Vernard Gambles. Details/risks of procedure, including but not limited to, internal bleeding, infection, injury to adjacent structures, discussed with patient and mother with their understanding and consent. Procedure scheduled for later today.    Thank you for this interesting consult.  I greatly enjoyed meeting Molly [name] and look forward to participating in their care.  A copy of this report was sent to the requesting provider on this  date.  Electronically Signed: D. Rowe Robert 07/19/2016, 11:10 AM   I spent a total of  30 minutes   in face to face in clinical consultation, greater than 50% of which was counseling/coordinating care for L1-2 disc space aspiration

## 2016-07-20 ENCOUNTER — Inpatient Hospital Stay (HOSPITAL_COMMUNITY): Payer: Self-pay

## 2016-07-20 DIAGNOSIS — B9689 Other specified bacterial agents as the cause of diseases classified elsewhere: Secondary | ICD-10-CM

## 2016-07-20 DIAGNOSIS — L02818 Cutaneous abscess of other sites: Secondary | ICD-10-CM

## 2016-07-20 DIAGNOSIS — D735 Infarction of spleen: Secondary | ICD-10-CM

## 2016-07-20 DIAGNOSIS — F17210 Nicotine dependence, cigarettes, uncomplicated: Secondary | ICD-10-CM

## 2016-07-20 DIAGNOSIS — R7881 Bacteremia: Secondary | ICD-10-CM

## 2016-07-20 DIAGNOSIS — R768 Other specified abnormal immunological findings in serum: Secondary | ICD-10-CM

## 2016-07-20 DIAGNOSIS — G061 Intraspinal abscess and granuloma: Secondary | ICD-10-CM

## 2016-07-20 DIAGNOSIS — M4625 Osteomyelitis of vertebra, thoracolumbar region: Secondary | ICD-10-CM

## 2016-07-20 DIAGNOSIS — Z91199 Patient's noncompliance with other medical treatment and regimen due to unspecified reason: Secondary | ICD-10-CM

## 2016-07-20 DIAGNOSIS — M4645 Discitis, unspecified, thoracolumbar region: Secondary | ICD-10-CM

## 2016-07-20 DIAGNOSIS — F1910 Other psychoactive substance abuse, uncomplicated: Secondary | ICD-10-CM

## 2016-07-20 DIAGNOSIS — Z9119 Patient's noncompliance with other medical treatment and regimen: Secondary | ICD-10-CM

## 2016-07-20 DIAGNOSIS — M4626 Osteomyelitis of vertebra, lumbar region: Principal | ICD-10-CM

## 2016-07-20 DIAGNOSIS — I330 Acute and subacute infective endocarditis: Secondary | ICD-10-CM

## 2016-07-20 LAB — CBC
HEMATOCRIT: 25.7 % — AB (ref 36.0–46.0)
HEMOGLOBIN: 7.7 g/dL — AB (ref 12.0–15.0)
MCH: 24.6 pg — ABNORMAL LOW (ref 26.0–34.0)
MCHC: 30 g/dL (ref 30.0–36.0)
MCV: 82.1 fL (ref 78.0–100.0)
Platelets: 159 10*3/uL (ref 150–400)
RBC: 3.13 MIL/uL — ABNORMAL LOW (ref 3.87–5.11)
RDW: 15.3 % (ref 11.5–15.5)
WBC: 3.2 10*3/uL — AB (ref 4.0–10.5)

## 2016-07-20 LAB — CK: Total CK: 30 U/L — ABNORMAL LOW (ref 38–234)

## 2016-07-20 LAB — BASIC METABOLIC PANEL
ANION GAP: 8 (ref 5–15)
BUN: 17 mg/dL (ref 6–20)
CHLORIDE: 101 mmol/L (ref 101–111)
CO2: 26 mmol/L (ref 22–32)
Calcium: 9 mg/dL (ref 8.9–10.3)
Creatinine, Ser: 0.82 mg/dL (ref 0.44–1.00)
GFR calc non Af Amer: >60 mL/min (ref >60)
GLUCOSE: 97 mg/dL (ref 65–99)
POTASSIUM: 3.8 mmol/L (ref 3.5–5.1)
Sodium: 135 mmol/L (ref 135–145)

## 2016-07-20 LAB — HIV ANTIBODY (ROUTINE TESTING W REFLEX): HIV SCREEN 4TH GENERATION: NONREACTIVE

## 2016-07-20 LAB — ECHOCARDIOGRAM COMPLETE
Height: 61 in
Weight: 1641.6 oz

## 2016-07-20 LAB — HEPATITIS C ANTIBODY: HCV Ab: >11 s/co ratio — ABNORMAL HIGH (ref 0.0–0.9)

## 2016-07-20 MED ORDER — DICLOFENAC SODIUM 1 % TD GEL
2.0000 g | Freq: Four times a day (QID) | TRANSDERMAL | Status: DC | PRN
  Filled 2016-07-20: qty 100

## 2016-07-20 MED ORDER — MAGNESIUM CITRATE PO SOLN
1.0000 | Freq: Once | ORAL | Status: AC
  Administered 2016-07-20: 1 via ORAL
  Filled 2016-07-20: qty 296

## 2016-07-20 NOTE — Consult Note (Signed)
Date of Admission:  07/19/2016  Date of Consult:  07/20/2016  Reason for Consult: Lumbar diskitis Referring Physician: Dr. Daryll Drown   HPI: Molly [name] is an 30 y.o. female with history of IVDU, MRSA aortic valve endocariditis and vertebral osteomyelitis who has previously been unable to stay in the hospital or commit to chronic IV abx in SNF to be treated properly for her endocarditis. She was seen by my partner Dr. Megan Salon in March most recently. She had AKI and TTpenia which were blamed in part on vancomycin and she had managed with daptomicin. She was then to receive  single dose of Oritavancin on day of discharge and then a dose of dalbavancin 1579m IV once on 3/27. However she left AMA before she could complete her dose of ortiavancin and her dose of dalbavancin was cancelled 2/2 non adherence. She was also suppose to be bactrim DS 2 tabs BID for one month however [facility] was not taking it. She now presents with abdominal pain, back pain.  CT scan showed:  Progressive changes throughout the lower thoracic and lumbar spine consistent with multifocal infectious discitis/osteomyelitis, most notable at L1-2 where there is now complete disc collapse, paravertebral phlegmon, likely 2.3 cm paravertebral abscess, and ventral epidural phlegmon resulting in moderate spinal stenosis  She also has persistent massive splenomegaly thought to be secondary to infarcts (likely from embolization from her IE)  She has had IR guided aspirate of her disk space and she is growing GPCC.      Past Medical History:  Diagnosis Date  . Abscess of skin    buttock - most recently 2009/10  . Drug-seeking behavior   . IV drug user   . MRSA (methicillin resistant Staphylococcus aureus) infection     Past Surgical History:  Procedure Laterality Date  . IR GENERIC HISTORICAL  07/19/2016   IR LUMBAR DISC ASPIRATION W/IMG GUIDE  07/19/2016 DArne Cleveland MD [facility]-INTERV RAD  . TEE WITHOUT CARDIOVERSION N/A 07/04/2013   Procedure: TRANSESOPHAGEAL ECHOCARDIOGRAM (TEE);  Surgeon: DLarey Dresser MD;  [facility];  Service: Cardiovascular;  Laterality: N/A;  . TONSILLECTOMY     30years old    Social History:  reports that she has been smoking Cigarettes.  She has been smoking about 1.00 pack per day. She has never used smokeless tobacco. She reports that she uses drugs, including IV and Oxycodone. She reports that she does not drink alcohol.   Family History  Problem Relation Age of Onset  . Alcoholism Father     No Known Allergies   Medications: I have reviewed patients current medications as documented in Epic Anti-infectives    Start     Dose/Rate Route Frequency Ordered Stop   07/19/16 2100  DAPTOmycin (CUBICIN) 372 mg in sodium chloride 0.9 % IVPB     8 mg/kg  46.5 kg 214.9 mL/hr over 30 Minutes Intravenous Every 24 hours 07/19/16 1945           ROS:  as in HPI + for areas of recent skin infection at injection sites otherwise remainder of 12 point Review of Systems is negative  Blood pressure 114/67, pulse 94, temperature 98.9 F (37.2 C), temperature source Oral, resp. rate 16, height '5\' 1"'  (1.549 m), weight 102 lb 9.6 oz (46.5 kg), SpO2 100 %. General: Alert and awake, oriented x3, not in any acute distress. HEENT: anicteric sclera,  EOMI, oropharynx  clear and without exudate Cardiovascular: egular rate, normal r,  no murmur rubs or gallops Pulmonary: clear to auscultation bilaterally, no wheezing, rales or rhonchi Gastrointestinal: soft nontender,distended with massive left sided splenomegaly Musculoskeletal: no splinters Skin, soft tissue:   07/20/16:  Dorsum right hand with injection site infecitons that she stated had been purulent    Left palm with one area in crease of finger/palm present for several weeks to months        Neuro: nonfocal, strength and sensation  intact   Results for orders placed or performed during the hospital encounter of 07/19/16 (from the past 48 hour(s))  CBC     Status: Abnormal   Collection Time: 07/19/16  8:25 AM  Result Value Ref Range   WBC 2.5 (L) 4.0 - 10.5 K/uL   RBC 3.12 (L) 3.87 - 5.11 MIL/uL   Hemoglobin 7.8 (L) 12.0 - 15.0 g/dL   HCT 26.0 (L) 36.0 - 46.0 %   MCV 83.3 78.0 - 100.0 fL   MCH 25.0 (L) 26.0 - 34.0 pg   MCHC 30.0 30.0 - 36.0 g/dL   RDW 15.2 11.5 - 15.5 %   Platelets 139 (L) 150 - 400 K/uL  Comprehensive metabolic panel     Status: Abnormal   Collection Time: 07/19/16  8:25 AM  Result Value Ref Range   Sodium 133 (L) 135 - 145 mmol/L   Potassium 4.0 3.5 - 5.1 mmol/L   Chloride 98 (L) 101 - 111 mmol/L   CO2 29 22 - 32 mmol/L   Glucose, Bld 111 (H) 65 - 99 mg/dL   BUN 22 (H) 6 - 20 mg/dL   Creatinine, Ser 0.84 0.44 - 1.00 mg/dL   Calcium 8.8 (L) 8.9 - 10.3 mg/dL   Total Protein 7.5 6.5 - 8.1 g/dL   Albumin 2.7 (L) 3.5 - 5.0 g/dL   AST 27 15 - 41 U/L   ALT 26 14 - 54 U/L   Alkaline Phosphatase 100 38 - 126 U/L   Total Bilirubin 0.4 0.3 - 1.2 mg/dL   GFR calc non Af Amer >60 >60 mL/min   GFR calc Af Amer >60 >60 mL/min    Comment: (NOTE) The eGFR has been calculated using the CKD EPI equation. This calculation has not been validated in all clinical situations. eGFR's persistently <60 mL/min signify possible Chronic Kidney Disease.    Anion gap 6 5 - 15  Lipase, blood     Status: None   Collection Time: 07/19/16  8:25 AM  Result Value Ref Range   Lipase 35 11 - 51 U/L  Protime-INR     Status: None   Collection Time: 07/19/16  1:14 PM  Result Value Ref Range   Prothrombin Time 13.7 11.4 - 15.2 seconds   INR 1.05   HIV antibody     Status: None   Collection Time: 07/19/16  1:14 PM  Result Value Ref Range   HIV Screen 4th Generation wRfx Non Reactive Non Reactive    Comment: (NOTE) Performed At: [facility] [place], [place] [id number] Lindon Romp MD  [id number]   Hepatitis C antibody     Status: Abnormal   Collection Time: 07/19/16  1:14 PM  Result Value Ref Range   HCV Ab >11.0 (H) 0.0 - 0.9 s/co ratio    Comment: (NOTE)  Negative:     < 0.8                             Indeterminate: 0.8 - 0.9                                  Positive:     > 0.9 The CDC recommends that a positive HCV antibody result be followed up with a HCV Nucleic Acid Amplification test (025852). Performed At: [facility] [place], [place] [id number] Lindon Romp MD [id number]   Culture, blood (Routine X 2) w Reflex to ID Panel     Status: None (Preliminary result)   Collection Time: 07/19/16  1:20 PM  Result Value Ref Range   Specimen Description BLOOD LEFT HAND    Special Requests IN PEDIATRIC BOTTLE 4CC    Culture NO GROWTH 1 DAY    Report Status PENDING   Culture, blood (single)     Status: None (Preliminary result)   Collection Time: 07/19/16  1:24 PM  Result Value Ref Range   Specimen Description BLOOD LEFT ANTECUBITAL    Special Requests IN PEDIATRIC BOTTLE 4CC    Culture NO GROWTH 1 DAY    Report Status PENDING   Aerobic/Anaerobic Culture (surgical/deep wound)     Status: None (Preliminary result)   Collection Time: 07/19/16  3:09 PM  Result Value Ref Range   Specimen Description BACK    Special Requests T12 L1 DISC ASPIRATE    Gram Stain      FEW WBC PRESENT,BOTH PMN AND MONONUCLEAR FEW GRAM POSITIVE COCCI IN PAIRS IN CLUSTERS    Culture CULTURE REINCUBATED FOR BETTER GROWTH    Report Status PENDING   CBC     Status: Abnormal   Collection Time: 07/20/16  6:13 AM  Result Value Ref Range   WBC 3.2 (L) 4.0 - 10.5 K/uL   RBC 3.13 (L) 3.87 - 5.11 MIL/uL   Hemoglobin 7.7 (L) 12.0 - 15.0 g/dL   HCT 25.7 (L) 36.0 - 46.0 %   MCV 82.1 78.0 - 100.0 fL   MCH 24.6 (L) 26.0 - 34.0 pg   MCHC 30.0 30.0 - 36.0 g/dL   RDW 15.3 11.5 - 15.5 %   Platelets 159 150 - 400 K/uL  Basic  metabolic panel     Status: None   Collection Time: 07/20/16  6:13 AM  Result Value Ref Range   Sodium 135 135 - 145 mmol/L   Potassium 3.8 3.5 - 5.1 mmol/L   Chloride 101 101 - 111 mmol/L   CO2 26 22 - 32 mmol/L   Glucose, Bld 97 65 - 99 mg/dL   BUN 17 6 - 20 mg/dL   Creatinine, Ser 0.82 0.44 - 1.00 mg/dL   Calcium 9.0 8.9 - 10.3 mg/dL   GFR calc non Af Amer >60 >60 mL/min   GFR calc Af Amer >60 >60 mL/min    Comment: (NOTE) The eGFR has been calculated using the CKD EPI equation. This calculation has not been validated in all clinical situations. eGFR's persistently <60 mL/min signify possible Chronic Kidney Disease.    Anion gap 8 5 - 15  CK     Status: Abnormal   Collection Time: 07/20/16  6:13 AM  Result Value Ref Range   Total CK 30 (L) 38 - 234 U/L   '@BRIEFLABTABLE' (sdes,specrequest,cult,reptstatus)   )  Recent Results (from the past 720 hour(s))  Culture, blood (Routine X 2) w Reflex to ID Panel     Status: None (Preliminary result)   Collection Time: 07/19/16  1:20 PM  Result Value Ref Range Status   Specimen Description BLOOD LEFT HAND  Final   Special Requests IN PEDIATRIC BOTTLE 4CC  Final   Culture NO GROWTH 1 DAY  Final   Report Status PENDING  Incomplete  Culture, blood (single)     Status: None (Preliminary result)   Collection Time: 07/19/16  1:24 PM  Result Value Ref Range Status   Specimen Description BLOOD LEFT ANTECUBITAL  Final   Special Requests IN PEDIATRIC BOTTLE 4CC  Final   Culture NO GROWTH 1 DAY  Final   Report Status PENDING  Incomplete  Aerobic/Anaerobic Culture (surgical/deep wound)     Status: None (Preliminary result)   Collection Time: 07/19/16  3:09 PM  Result Value Ref Range Status   Specimen Description BACK  Final   Special Requests T12 L1 DISC ASPIRATE  Final   Gram Stain   Final    FEW WBC PRESENT,BOTH PMN AND MONONUCLEAR FEW GRAM POSITIVE COCCI IN PAIRS IN CLUSTERS    Culture CULTURE REINCUBATED FOR BETTER GROWTH  Final    Report Status PENDING  Incomplete     Impression/Recommendation  Principal Problem:   Discitis Active Problems:   Intravenous drug abuse, continuous   Anemia   Constipation   Nasia Cannan is a 30 y.o. 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 with massive splenomegaly known vertebral osteo and diskitis noncompliant with therapy now readmitted with disktis and vertebral osteo that is worse  #1 Progressive vertebral osteomyelitis and diskitis with phlegmon:  Grateful to ED, IR and team for accomplishing IR guided aspirate OFF abx  Looks likely to be MRSA again  Continue IV daptomicin for now  She claims she is willing to go to SNF but she also asked me about the option of the ORITAVANCIN  The latter may be worth giving to her to ensure she gets at least 2 weeks of IV therapy   #2 Abscesses soft tissue insults form IVDU: she claimed these were purulent obviously the source of prior and untreated endocarditis and diskitis, osteomyelitis   #3 IVDU: again discussed how critical it is for her to engage in program to treat this deadly addiction.   07/20/2016, 7:32 PM   Thank you so much for this interesting consult  [place] [facility] [place] [phone] (pager) [phone] (office) 07/20/2016, 7:32 PM  Rhina Brackett [place] 07/20/2016, 7:32 PM

## 2016-07-20 NOTE — Progress Notes (Signed)
   Subjective: Patient was still complaining of abdominal pain, she did not had a bowel movement since this morning. She also complained of backache after the IR procedure. She denies any nausea or vomiting. She states that she never got the enema.  Objective:  Vital signs in last 24 hours: Vitals:   07/20/16 0114 07/20/16 0451 07/20/16 0957 07/20/16 1425  BP: 120/77 119/83 111/80 111/82  Pulse: 95 87 96 (!) 104  Resp: 18 18 18 18   Temp: 97.8 F (36.6 C) 98.5 F (36.9 C) 99.6 F (37.6 C) 99.8 F (37.7 C)  TempSrc: Oral Oral Oral Oral  SpO2: 98% 97% 99% 96%  Weight:      Height:       Gen. alert and oriented, lean lady, in no acute distress. Lungs. Clear bilaterally CV. Regular rate and rhythm, no murmurs/gallops/rubs. Abdomen. Distended with diffuse tenderness, splenomegaly noted. Bowel sounds positive. Extremities. Multiple healed skin lesions on left hand. No edema, no cyanosis, pulses 2+ bilaterally. Back. Tender biopsy site, clear bandage.  Assessment/Plan:  Molly [name] is a 30 yo woman with PMH of IVDU, MRSA valvular endocarditis and OM of the T/L spine who presents for worsening abdominal pain and distention.  She has not had a BM for 3 weeks and has cramping pain.   Discitis- On CT scan of the abdomen, she was noted to have discitis/osteomyelitis of L1-2 and a paravertebral abscess.  ID and IR were consulted in the ED and patient will undergo aspiration of abscess. -ID was consulted., Waiting for aspiration results. -Ibuprofen for pain. -We started her on daptomycin considering drink previous results, will adjust antibiotic according to current aspiration results.  H/o MRSA endocarditis. It was insufficiently treated in February as patient left AMA. -Repeat echo(TTE) done today does not show any vegetation. -TEE to reevaluate.  Constipation. She had a big bowel movement after magnesium citrate. Continuity magnesium citrate as needed.  Neutropenia and anemia--  chronic, likely multifactorial due to splenomegaly and anemia of chronic disease. Last anemia panel was in 2014 w/ low iron and low TIBC. Her hgb is at b/l, 7.5-8. Splenomegaly unchanged from prior CT.  Opioid addiction-- - reordered home suboxone  5.7-1.4 mg BID. -Hep C antibody positive, -Chek hep C RNA. -HIV antibody- nonreactive.  Dispo: Anticipated discharge in approximately 2-3 day(s).   Molly Nimrod, MD 07/20/2016, 3:54 PM Pager: [phone]

## 2016-07-20 NOTE — Progress Notes (Signed)
Lab tech [place] reported that pt refused lab draw this am but pt reported to this nurse that she had been stuck numerous times this am so she wanted to reschedule. Pt educated. Pt lab draw rescheduled with lab tech.

## 2016-07-20 NOTE — Progress Notes (Signed)
  Echocardiogram 2D Echocardiogram has been performed.  Jennette Dubin 07/20/2016, 3:38 PM

## 2016-07-20 NOTE — Progress Notes (Signed)
Pt complains of right sided chest and hip discomfort rating pain 10/10. Pt noted sitting up in a chair with her legs crossed. Vital signs within normal range. Denying shortness of breath, O2 sat 100%.  Administered Ibuprofen per request. Pt stated, " I think I need something for anxiety. I am bipolar and I have anxiety, can you ask the doctor if I can get something."  Md notified. Instructed to monitor status and Md will come by to see pt.

## 2016-07-21 ENCOUNTER — Ambulatory Visit (HOSPITAL_COMMUNITY)
Admission: AD | Admit: 2016-07-21 | Discharge: 2016-07-21 | Disposition: A | Discharge status: Home / Self Care | Payer: Self-pay | Source: Ambulatory Visit | Attending: Emergency Medicine | Admitting: Emergency Medicine

## 2016-07-21 DIAGNOSIS — I358 Other nonrheumatic aortic valve disorders: Secondary | ICD-10-CM | POA: Insufficient documentation

## 2016-07-21 DIAGNOSIS — I748 Embolism and thrombosis of other arteries: Secondary | ICD-10-CM | POA: Insufficient documentation

## 2016-07-21 DIAGNOSIS — Z79899 Other long term (current) drug therapy: Secondary | ICD-10-CM | POA: Insufficient documentation

## 2016-07-21 DIAGNOSIS — D709 Neutropenia, unspecified: Secondary | ICD-10-CM | POA: Insufficient documentation

## 2016-07-21 DIAGNOSIS — M4645 Discitis, unspecified, thoracolumbar region: Secondary | ICD-10-CM | POA: Insufficient documentation

## 2016-07-21 DIAGNOSIS — D649 Anemia, unspecified: Secondary | ICD-10-CM | POA: Insufficient documentation

## 2016-07-21 DIAGNOSIS — R161 Splenomegaly, not elsewhere classified: Secondary | ICD-10-CM | POA: Insufficient documentation

## 2016-07-21 DIAGNOSIS — K5900 Constipation, unspecified: Secondary | ICD-10-CM | POA: Insufficient documentation

## 2016-07-21 DIAGNOSIS — F1120 Opioid dependence, uncomplicated: Secondary | ICD-10-CM | POA: Insufficient documentation

## 2016-07-21 DIAGNOSIS — M4625 Osteomyelitis of vertebra, thoracolumbar region: Secondary | ICD-10-CM | POA: Insufficient documentation

## 2016-07-21 LAB — CBC
HCT: 24.2 % — ABNORMAL LOW (ref 36.0–46.0)
HEMOGLOBIN: 7.3 g/dL — AB (ref 12.0–15.0)
MCH: 24.7 pg — AB (ref 26.0–34.0)
MCHC: 30.2 g/dL (ref 30.0–36.0)
MCV: 82 fL (ref 78.0–100.0)
Platelets: 153 10*3/uL (ref 150–400)
RBC: 2.95 MIL/uL — AB (ref 3.87–5.11)
RDW: 15.5 % (ref 11.5–15.5)
WBC: 2.8 10*3/uL — ABNORMAL LOW (ref 4.0–10.5)

## 2016-07-21 MED ORDER — ORITAVANCIN DIPHOSPHATE 400 MG IV SOLR
1200.0000 mg | Freq: Once | INTRAVENOUS | Status: DC
  Filled 2016-07-21: qty 120

## 2016-07-21 MED ORDER — DOXYCYCLINE HYCLATE 100 MG PO CAPS: 100.0000 mg | ORAL_CAPSULE | Freq: Two times a day (BID) | ORAL | 0 refills | Status: DC

## 2016-07-21 MED ORDER — NAPROXEN 250 MG PO TABS
500.0000 mg | ORAL_TABLET | Freq: Two times a day (BID) | ORAL | Status: DC
  Administered 2016-07-21 (×2): 500 mg via ORAL
  Filled 2016-07-21 (×2): qty 2

## 2016-07-21 MED ORDER — SENNOSIDES-DOCUSATE SODIUM 8.6-50 MG PO TABS: 1.0000 | ORAL_TABLET | Freq: Every evening | ORAL | 0 refills | Status: DC | PRN

## 2016-07-21 MED ORDER — SODIUM CHLORIDE 0.9 % IV SOLN
8.0000 mg/kg | INTRAVENOUS | Status: DC
  Filled 2016-07-21: qty 7.44

## 2016-07-21 MED ORDER — DICLOFENAC SODIUM 1 % TD GEL
2.0000 g | Freq: Four times a day (QID) | TRANSDERMAL | Status: DC
  Administered 2016-07-21 (×2): 2 g via TOPICAL
  Filled 2016-07-21: qty 100

## 2016-07-21 MED ORDER — DEXTROSE 5 % IV SOLN
1200.0000 mg | Freq: Once | INTRAVENOUS | Status: AC
  Administered 2016-07-21: 1200 mg via INTRAVENOUS
  Filled 2016-07-21: qty 120

## 2016-07-21 NOTE — Progress Notes (Signed)
   Subjective: Patient was feeling much better today. Her constipation has been resolved. She still have mild back pain. Her mom was in the room and she wants to find out the plan for her. She wants to take her back home not to his nursing facility. She states that she can take care of her better at home.  Objective:  Vital signs in last 24 hours: Vitals:   07/21/16 0110 07/21/16 0630 07/21/16 0947 07/21/16 1415  BP: (!) 93/55 96/63 107/70 114/75  Pulse: 81 71 89 78  Resp: 18 18 18 18   Temp: 99.2 F (37.3 C) 98.9 F (37.2 C) 98.8 F (37.1 C) 99.2 F (37.3 C)  TempSrc: Oral Oral Oral Oral  SpO2: 99% 99% 100% 99%  Weight:      Height:       Gen. alert and oriented, lean lady, in no acute distress. Lungs. Clear bilaterally CV. Regular rate and rhythm, no murmurs/gallops/rubs. Abdomen.  soft, nontender,  splenomegaly noted. Bowel sounds positive. Extremities. Multiple healed skin lesions on left hand. No edema, no cyanosis, pulses 2+ bilaterally. Back. Tender biopsy site, clear bandage  Assessment/Plan:  Ms. 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 She has not had a BM for 3 weeks and has cramping pain.  Discitis- On CT scan of the abdomen, she was noted to have discitis/osteomyelitis of L1-2 and a paravertebral abscess. Her disc aspirate is growing gram-positive cocci most probably MRSA as she had that before. Her blood culture remains negative. She was already on daptomycin, ID is planning to start her on ORITAVANCIN today, and then they are trying to set her up for outpatient Dalbavancin. We also want her to be on oral doxycycline taken twice daily for 8 weeks on discharge.  H/o MRSA endocarditis. It was insufficiently treated in February as patient left AMA. -Repeat echo(TTE) done today does not show any vegetation. -Repeat TEE might not be needed, as we are  treating her for gram-positive cocci already. She also does not exhibit any sign and symptoms of ongoing endocarditis currently.  Constipation. Resolved. -Continue magnesium citrate as needed.  Neutropenia and anemia-- chronic, likely multifactorial due to splenomegaly and anemia of chronic disease. Last anemia panel was in 2014 w/ low iron and low TIBC. Her hgb is at b/l, 7.5-8. Splenomegaly unchanged from prior CT.  Opioid addiction-she needs to find a way to quit. - reordered home suboxone 5.7-1.4 mg BID. -Hep C antibody positive, -Chek hep C RN- pending -HIV antibody- nonreactive.  Dispo: Anticipated discharge in approximately 1 day(s).   Lorella Nimrod, MD 07/21/2016, 2:50 PM Pager: [phone]

## 2016-07-21 NOTE — Progress Notes (Signed)
Pt was discharged this afternoon with her mom taking all personal belongings. Pt sent to short stay outpatient for infusion. No noted distress. Discharge instructions provided with verbal understanding. Pt was informed of scheduled appt to follow up. IV remained for infusion received by nurse.

## 2016-07-21 NOTE — Progress Notes (Signed)
Pt observed ambulating in the hall. Pt educated on Md order privilege to cafeteria and [employer]. Alert with no noted distress. Will continue to monitor.

## 2016-07-21 NOTE — Progress Notes (Signed)
Subjective: Patient's mother does not want her to go to skilled nursing facility once take her home   Antibiotics:  Anti-infectives    Start     Dose/Rate Route Frequency Ordered Stop   07/19/16 2100  DAPTOmycin (CUBICIN) 372 mg in sodium chloride 0.9 % IVPB     8 mg/kg  46.5 kg 214.9 mL/hr over 30 Minutes Intravenous Every 24 hours 07/19/16 1945        Medications: Scheduled Meds: . Buprenorphine HCl-Naloxone HCl  1 tablet Sublingual Q12H  . DAPTOmycin (CUBICIN)  IV  8 mg/kg Intravenous Q24H  . diclofenac sodium  2 g Topical QID  . fluticasone  2 spray Each Nare Daily  . heparin subcutaneous  5,000 Units Subcutaneous Q8H  . naproxen  500 mg Oral BID WC  . nicotine  21 mg Transdermal Daily  . senna-docusate  1 tablet Oral QHS   Continuous Infusions:  PRN Meds:.acetaminophen **OR** acetaminophen, diclofenac sodium, lidocaine    Objective: Weight change:   Intake/Output Summary (Last 24 hours) at 07/21/16 1302 Last data filed at 07/21/16 0657  Gross per 24 hour  Intake              597 ml  Output                0 ml  Net              597 ml   Blood pressure 107/70, pulse 89, temperature 98.8 F (37.1 C), temperature source Oral, resp. rate 18, height 5\' 1"  (1.549 m), weight 102 lb 9.6 oz (46.5 kg), SpO2 100 %. Temp:  [98.6 F (37 C)-99.8 F (37.7 C)] 98.8 F (37.1 C) (10/04 0947) Pulse Rate:  [71-104] 89 (10/04 0947) Resp:  [16-18] 18 (10/04 0947) BP: (93-114)/(55-82) 107/70 (10/04 0947) SpO2:  [96 %-100 %] 100 % (10/04 0947)  Physical Exam: General: Alert and awake, oriented x3, not in any acute distress. HEENT: anicteric sclera,  EOMI, oropharynx clear and without exudate Cardiovascular: egular rate, normal r,  no murmur rubs or gallops Pulmonary: clear to auscultation bilaterally, no wheezing, rales or rhonchi Gastrointestinal: soft nontender,distended with massive left sided splenomegaly Musculoskeletal: no splinters Skin, soft tissue:    07/20/16:  Dorsum right hand with injection site infecitons that she stated had been purulent    Left palm with one area in crease of finger/palm present for several weeks to months        Neuro: nonfocal, strength and sensation intact    CBC: CBC Latest Ref Rng & Units 07/21/2016 07/20/2016 07/19/2016  WBC 4.0 - 10.5 K/uL 2.8(L) 3.2(L) 2.5(L)  Hemoglobin 12.0 - 15.0 g/dL 7.3(L) 7.7(L) 7.8(L)  Hematocrit 36.0 - 46.0 % 24.2(L) 25.7(L) 26.0(L)  Platelets 150 - 400 K/uL 153 159 139(L)      BMET  Recent Labs  07/19/16 0825 07/20/16 0613  NA 133* 135  K 4.0 3.8  CL 98* 101  CO2 29 26  GLUCOSE 111* 97  BUN 22* 17  CREATININE 0.84 0.82  CALCIUM 8.8* 9.0     Liver Panel   Recent Labs  07/18/16 2236 07/19/16 0825  PROT 8.1 7.5  ALBUMIN 2.9* 2.7*  AST 28 27  ALT 29 26  ALKPHOS 112 100  BILITOT 0.3 0.4       Sedimentation Rate No results for input(s): ESRSEDRATE in the last 72 hours. C-Reactive Protein No results for input(s): CRP in the last 72 hours.  Micro Results:  Recent Results (from the past 720 hour(s))  Culture, blood (Routine X 2) w Reflex to ID Panel     Status: None (Preliminary result)   Collection Time: 07/19/16  1:20 PM  Result Value Ref Range Status   Specimen Description BLOOD LEFT HAND  Final   Special Requests IN PEDIATRIC BOTTLE 4CC  Final   Culture NO GROWTH 1 DAY  Final   Report Status PENDING  Incomplete  Culture, blood (single)     Status: None (Preliminary result)   Collection Time: 07/19/16  1:24 PM  Result Value Ref Range Status   Specimen Description BLOOD LEFT ANTECUBITAL  Final   Special Requests IN PEDIATRIC BOTTLE 4CC  Final   Culture NO GROWTH 1 DAY  Final   Report Status PENDING  Incomplete  Aerobic/Anaerobic Culture (surgical/deep wound)     Status: None (Preliminary result)   Collection Time: 07/19/16  3:09 PM  Result Value Ref Range Status   Specimen Description BACK  Final   Special Requests  T12 L1 DISC ASPIRATE  Final   Gram Stain   Final    FEW WBC PRESENT,BOTH PMN AND MONONUCLEAR FEW GRAM POSITIVE COCCI IN PAIRS IN CLUSTERS    Culture   Final    MODERATE STAPHYLOCOCCUS AUREUS SUSCEPTIBILITIES TO FOLLOW NO ANAEROBES ISOLATED; CULTURE IN PROGRESS FOR 5 DAYS    Report Status PENDING  Incomplete    Studies/Results: Ir Lumbar Disc Aspiration W/img Guide  Result Date: 07/19/2016 INDICATION: Chronic progressive Multilevel discitis/ osteomyelitis. Progressive endplate changes at A54-U9 and L1-2. Adjacent right paravertebral abscess at L1-2. EXAM: IR DISC ASPIRATION WITH IMAGE GUIDE ANESTHESIA/SEDATION: Intravenous Fentanyl and Versed were administered as conscious sedation during continuous monitoring of the patient's level of consciousness and physiological / cardiorespiratory status by the radiology RN, with a total moderate sedation time of 25 minutes. COMPLICATIONS: None immediate. PROCEDURE: Informed written consent was obtained from the patient after a thorough discussion of the procedural risks, benefits and alternatives. All questions were addressed. Maximal Sterile Barrier Technique was utilized including caps, mask, sterile gowns, sterile gloves, sterile drape, hand hygiene and skin antiseptic. A timeout was performed prior to the initiation of the procedure. Appropriate skin entry site was determined under fluoroscopy. Skin prepped with Betadine, draped in usual sterile fashion, infiltrated locally with 1% lidocaine. Initially, 16 gauge trocar needle was advanced to the right side of the L1 to interspace in hopes of aspirating the abscess seen on CT. However, no fluid returned. The needle would not advance into the interspace because of marked narrowing. A new sterile 16 gauge trocar needle was then advanced into the T12-L1 interspace from a right parasagittal approach. Tip position confirmed on AP and lateral images. Approximately 4 mL bloody fluid were aspirated, sent for Gram  stain and culture IMPRESSION: Technically successful T12-L1 disc aspiration under fluoroscopy Electronically Signed   By: Lucrezia Europe M.D.   On: 07/19/2016 15:41      Assessment/Plan:  INTERVAL HISTORY: as above pts mother and pt want to go home and want the long acting antibiotic   Principal Problem:   Discitis Active Problems:   Intravenous drug abuse, continuous   Anemia   Constipation   Noncompliance    Anasia Agro is a 30 y.o. 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 with massive splenomegaly known vertebral osteo and diskitis noncompliant with therapy now readmitted with disktis and vertebral osteo that is worse  #1 Progressive vertebral osteomyelitis and diskitis  with phlegmon:  My pharmacy team will get paperwork filled out so that we can be reimbursed dose of ORITAVANCIN in this charity case (drug costs 2-3K per dose)  We will give her a dose today  We can then set her up for outpatient dalbavancin dosing as we tried before  I would also like her to leave with oral doxycyline (presuming it is active vs this SA in hand to be taken bid x 8 weeks) in case she somehow misses her followup appts for Dalbavancin  #2 Cellulitis: see above   #3 IVDU; as per yesterdays note she has got to find a way to quit.  We will work on ORITAVANCIN ( I will put in order if not done so already) as well as outpatient infusions  I will arrange [facility] with [place] in the next month     LOS: 2 days   Alcide Evener 07/21/2016, 1:02 PM

## 2016-07-21 NOTE — Progress Notes (Addendum)
Pt was dc as an inpatient and brought in to procedural short stay for an IV infusion of antibiotics// med to be given over three hours, VSS. Pt came with a working IV in the right bicep. 2030 pt feeling well, no complaints, watching tv.   2235 pt feeling well, no complaints, ambulated with family to exit without complications.

## 2016-07-21 NOTE — Consult Note (Signed)
In relation to Oritavancin and its use in treating endocarditis: Oritavancin is a semisynthetic lipoglycopeptide antibacterial agent with bactericidal activity against Gram-positive microorganisms. It works by inhibiting the transglycosylation of cell wall biosynthesis by binding to the stem peptide of peptidoglycan precursors. It also inhibits the transpeptidation step of cell wall biosynthesis by binding to cell wall peptide bridge segments, and disrupts the bacterial membrane integrity, causing depolarization, permeabilization, and cell death. Its bactericidal activity is concentration-dependent. Of note, Oritavancin has a half-life of about 245 hours, allowing the drug to be given in a single dose for treatment of skin and soft tissue infections. Currently, Oritavancin has only been approved for the treatment of acute bacterial skin and skin structure infections. Oritavancin has proven to be effective in the treatment of endocarditis in rats; however, the use of Oritavancin in treating endocarditis has not yet been studied in humans. The FDA-approved dose for Oritavancin in skin and soft tissue infections is a single dose of 1200mg , given via an IV infusion over three hours. The most common side effect with Oritavancin is infusion site reactions, although osteomyelitis has been reported in several patients. For this particular patient, who has a history of IV drug use and leaving AMA, Oritavancin may be a potential option for the treatment of her endocarditis prior to discharge. However, this would be an off-label use and the dosage for Oritavancin in this disease state has not been studied or established.  Citation: Brade KD, Rybak JM, Rybak MJ. Oritavancin: A New Lipoglycopeptide Antibiotic in the Treatment of Gram-Positive Infections. Infectious Diseases and Therapy. 2016;5(1):1-15. [id number].  Shiela Mayer, Pharm.D Candidate, [place], [place].D  Candidate, [place], [place].D Candidate, [place]

## 2016-07-21 NOTE — Discharge Summary (Signed)
Name: Molly [name] MRN: [id number] DOB: 19-Oct-1985 30 y.o. PCP: No Pcp Per Patient  Date of Admission: 07/19/2016  3:21 AM Date of Discharge: 07/22/2016 Attending Physician: No att. providers found  Discharge Diagnosis: 1. Discitis 2.IV drug Abuse 3.Constipation.   Discharge Medications:   Medication List    TAKE these medications   doxycycline 100 MG capsule Commonly known as:  VIBRAMYCIN Take 1 capsule (100 mg total) by mouth 2 (two) times daily.   ibuprofen 200 MG tablet Commonly known as:  ADVIL,MOTRIN Take 600 mg by mouth every 6 (six) hours as needed for moderate pain.   senna-docusate 8.6-50 MG tablet Commonly known as:  Senokot-S Take 1 tablet by mouth at bedtime as needed for mild constipation.   traZODone 50 MG tablet Commonly known as:  DESYREL Take 1 tablet (50 mg total) by mouth at bedtime as needed for sleep.   ZUBSOLV 5.7-1.4 MG Subl Generic drug:  Buprenorphine HCl-Naloxone HCl Place 1 tablet under the tongue every 12 (twelve) hours.       Disposition and follow-up:   MollyMolly [name] was discharged from [facility] in Good condition.  At the hospital follow up visit please address:  1.  Her compliance with antibiotics, and her IV drug behavior.  2.  Labs / imaging needed at time of follow-up: Hep C RNA 3.  Pending labs/ test needing follow-up: Culture results from disc aspirate.  Follow-up Appointments: Follow-up Information    Alcide Evener, MD. Call today.   Specialty:  Infectious Diseases Why:  to make an appointment for follow up Contact information: 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 She has not had a BM for 3 weeks and has cramping pain.  1. Discitis- On CT scan of  the abdomen, she was noted to have discitis/osteomyelitis of L1-2 and a paravertebral abscess. ID and IR were consulted in the ED and patient will undergo aspiration of abscess. Her disc aspirate is growing gram-positive cocci most probably MRSA as she had that before. Her blood culture remains negative. Later on culture results of aspirate shows MRSA, sensitive to ciprofloxacin, clindamycin, gentamicin, rifampin, tetracycline, Bactrim, vancomycin. She was given daptomycin during her hospital stay, because of her previous history of being noncompliant with diagnosis of MRSA endocarditis and osteomyelitis, ID decided to give her 1 dose of ORITAVANCIN, as an outpatient at short stay. They set her up for outpatient Dalbavancin. She was being discharged to get her first treatment of ORITAVANCIN at short stay.  Constipation. She was severely constipated for 3 weeks, most probably due to her concurrent opioid use. Her constipation resolved after giving magnesium citrate.   IV opioid abuse. She is still using IV opioids, in spite of going to Suboxone clinic regularly. Her last use was 2 days before admission. We talked with her and counseled her that this IV drug abuse can kill her because of ongoing infection in her bone and heart. We restart her on her home dose of Suboxone and and encouraged her to visit clinic regularly and try to quit. She was discharged back home with her mom, who states that she will keep an eye on her and make sure that she will stay sober.  Discharge Vitals:   BP 114/75 (BP Location: Left Arm)   Pulse 78   Temp 99.2 F (37.3 C) (Oral)   Resp 18   Ht 5\' 1"  (1.549 m)   Wt 102 lb 9.6 oz (46.5 kg)   SpO2 99%   BMI 19.39 kg/m   Gen.alert and oriented,lean lady, in no acute distress. Lungs.Clear bilaterally CV.Regular rate and rhythm,no murmurs/gallops/rubs. Abdomen. soft, nontender, splenomegaly noted.Bowel sounds positive. Extremities.Multiple healed skin  lesions on left hand.No edema,no cyanosis,pulses 2+ bilaterally. Back.Tender biopsy site,No edema or erythema.  Pertinent Labs, Studies, and Procedures:  CBC    Component Value Date/Time   WBC 2.8 (L) 07/21/2016 0740   RBC 2.95 (L) 07/21/2016 0740   HGB 7.3 (L) 07/21/2016 0740   HCT 24.2 (L) 07/21/2016 0740   PLT 153 07/21/2016 0740   MCV 82.0 07/21/2016 0740   MCH 24.7 (L) 07/21/2016 0740   MCHC 30.2 07/21/2016 0740   RDW 15.5 07/21/2016 0740   LYMPHSABS 0.9 12/20/2015 0537   MONOABS 0.2 12/20/2015 0537   EOSABS 0.0 12/20/2015 0537   BASOSABS 0.0 12/20/2015 0537   CMP Latest Ref Rng & Units 07/20/2016 07/19/2016 07/18/2016  Glucose 65 - 99 mg/dL 97 111(H) 111(H)  BUN 6 - 20 mg/dL 17 22(H) 23(H)  Creatinine 0.44 - 1.00 mg/dL 0.82 0.84 0.93  Sodium 135 - 145 mmol/L 135 133(L) 136  Potassium 3.5 - 5.1 mmol/L 3.8 4.0 4.2  Chloride 101 - 111 mmol/L 101 98(L) 100(L)  CO2 22 - 32 mmol/L 26 29 28   Calcium 8.9 - 10.3 mg/dL 9.0 8.8(L) 9.0  Total Protein 6.5 - 8.1 g/dL - 7.5 8.1  Total Bilirubin 0.3 - 1.2 mg/dL - 0.4 0.3  Alkaline Phos 38 - 126 U/L - 100 112  AST 15 - 41 U/L - 27 28  ALT 14 - 54 U/L - 26 29   Hep C AB .>11.0  HIV. Non reactive  CK  30 Aerobic/Anaerobic Culture (surgical/deep wound)  Order: [id number]  Status:  Preliminary result Visible to patient:  No (Not Released) Next appt:  None   3d ago  Specimen Description BACK   Special Requests T12 L1 DISC ASPIRATE   Gram Stain FEW WBC PRESENT,BOTH PMN AND MONONUCLEAR FEW GRAM POSITIVE COCCI IN PAIRS IN CLUSTERS   Culture MODERATE METHICILLIN RESISTANT STAPHYLOCOCCUS AUREUS NO ANAEROBES ISOLATED; CULTURE IN PROGRESS FOR 5 DAYS   Report Status PENDING   Organism ID, Bacteria METHICILLIN RESISTANT STAPHYLOCOCCUS AUREUS   Resulting Agency [facility]  Susceptibility    Methicillin resistant staphylococcus aureus    MIC    CIPROFLOXACIN <=0.5 SENSITIVE "><=0.5 SENSI... Sensitive    CLINDAMYCIN <=0.25 SENSITIVE  "><=0.25 SENS... Sensitive    ERYTHROMYCIN >=8 RESISTANT  Resistant    GENTAMICIN <=0.5 SENSITIVE "><=0.5 SENSI... Sensitive    Inducible Clindamycin NEGATIVE  Sensitive    OXACILLIN >=4 RESISTANT  Resistant    RIFAMPIN <=0.5 SENSITIVE "><=0.5 SENSI... Sensitive    TETRACYCLINE <=1 SENSITIVE "><=1 SENSITIVE  Sensitive    TRIMETH/SULFA <=10 SENSITIVE "><=10 SENSIT... Sensitive    VANCOMYCIN <=0.5 SENSITIVE "><=0.5 SENSI... Sensitive         Susceptibility Comments   Methicillin resistant staphylococcus aureus  MODERATE METHICILLIN RESISTANT STAPHYLOCOCCUS AUREUS    Specimen Collected: 07/19/16 15:09 Last Resulted: 07/22/16 08:55             Discharge Instructions: Discharge Instructions    Diet - low sodium heart healthy    Complete by:  As directed    Increase  activity slowly    Complete by:  As directed       Signed: Lorella Nimrod, MD 07/22/2016, 4:01 PM   Pager: [phone]

## 2016-07-21 NOTE — Discharge Instructions (Signed)
Ms. Humbarger,  You were treated for an infection in your spine. We did not find an infection in your blood stream or in your heart. We are giving you a dose of a long acting antibiotics, but it will be very important for you to take Doxycycline 100 mg twice a day for 8 weeks in addition to following up for your second dose of the long acting antibiotic.   Please follow up with Dr. Derek Mound office. The number is listed for you to call. They will also contact you with an appointment. Please abstain from further drug use. This is vital for your health and safety.

## 2016-07-24 LAB — AEROBIC/ANAEROBIC CULTURE W GRAM STAIN (SURGICAL/DEEP WOUND)

## 2016-07-24 LAB — AEROBIC/ANAEROBIC CULTURE (SURGICAL/DEEP WOUND)

## 2016-07-24 LAB — CULTURE, BLOOD (SINGLE): CULTURE: NO GROWTH

## 2016-07-24 LAB — CULTURE, BLOOD (ROUTINE X 2): Culture: NO GROWTH

## 2016-08-05 ENCOUNTER — Telehealth: Payer: Self-pay | Admitting: Pharmacist

## 2016-08-05 ENCOUNTER — Telehealth: Payer: Self-pay | Admitting: Pharmacist Clinician (PhC)/ Clinical Pharmacy Specialist

## 2016-08-05 NOTE — Telephone Encounter (Signed)
Error

## 2016-08-24 ENCOUNTER — Encounter: Payer: Self-pay | Admitting: Internal Medicine

## 2016-08-24 ENCOUNTER — Ambulatory Visit (INDEPENDENT_AMBULATORY_CARE_PROVIDER_SITE_OTHER): Payer: Self-pay | Admitting: Internal Medicine

## 2016-08-24 DIAGNOSIS — M4625 Osteomyelitis of vertebra, thoracolumbar region: Secondary | ICD-10-CM

## 2016-08-24 DIAGNOSIS — Z23 Encounter for immunization: Secondary | ICD-10-CM

## 2016-08-24 DIAGNOSIS — F1910 Other psychoactive substance abuse, uncomplicated: Secondary | ICD-10-CM

## 2016-08-24 DIAGNOSIS — R768 Other specified abnormal immunological findings in serum: Secondary | ICD-10-CM | POA: Insufficient documentation

## 2016-08-24 MED ORDER — DALBAVANCIN HCL 500 MG IV SOLR: 1500.0000 mg | Freq: Once | INTRAVENOUS | Status: DC

## 2016-08-24 NOTE — Assessment & Plan Note (Signed)
I encouraged her to continue going to her counseling program and using Suboxone. I also encouraged her to get a sponsor and start attending more AA and NA meetings. She seems motivated to try to quit injecting and using drugs off the street.

## 2016-08-24 NOTE — Progress Notes (Signed)
[place] for Infectious Disease  Patient Active Problem List   Diagnosis Date Noted  . Osteomyelitis of vertebra of thoracolumbar region [facility])     Priority: High  . 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 Septic pulmonary embolism ([place]) 07/03/2013    Priority: High  . MRSA bacteremia 07/03/2013    Priority: High  . Intravenous drug abuse, continuous 07/03/2013    Priority: Medium  . Hepatitis C antibody test positive 08/24/2016  . Splenomegaly   . Absolute anemia   . Polysubstance abuse   . Cocaine use 11/28/2015  . Pancytopenia ([place]) 11/27/2015  . AKI (acute kidney injury) ([place]) 11/27/2015  . Anasarca 11/27/2015  . Cellulitis of multiple sites of right hand and fingers 07/07/2013  . Anemia 07/04/2013  . Pulmonary HTN ([place]) 07/04/2013  . 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   DOXYCYCLINE (VIBRAMYCIN) 100 MG CAPSULE    Take 1 capsule (100 mg total) by mouth 2 (two) times daily.   IBUPROFEN (ADVIL,MOTRIN) 200 MG TABLET    Take 600 mg by mouth every 6 (six) hours as needed for moderate pain.   SENNA-DOCUSATE (SENOKOT-S) 8.6-50 MG TABLET    Take 1 tablet by mouth at bedtime as needed for mild constipation.   TRAZODONE (DESYREL) 50 MG TABLET    Take 1 tablet (50 mg total) by mouth at bedtime as needed for sleep.  Modified Medications   No medications on file  Discontinued Medications   No medications on file    Subjective: Molly [name] is in for her hospital follow-up visit. She's been hospitalized 5 times this year with complications related to MRSA aortic valve endocarditis. She has left AGAINST MEDICAL ADVICE on many occasions and has had only partial treatment during each  hospitalization. She was rehospitalized last month with advanced thoracolumbar osteomyelitis. Blood cultures and transthoracic echocardiogram were negative at that time. She was treated with vancomycin then given one dose of oritavancin on 07/21/2016, the day of discharge. She has also been taking doxycycline since discharge. She estimates she misses the evening dose about 2 times each week when she leaves it in her car and forgets to take the dose. She continues to go [facility] [place] once each week to get her Suboxone. She states that she has bought some Opana tablets on the street since she was discharged but finds them too expensive to use more than once or twice a week. She states that she is also shot up 4 times since discharge. She tells me that she is trying to better my life but that it is so hard. She has been addicted and using drugs for the past 5 years. She attended 1 AA and 1 NA meeting since her discharge and found it to be somewhat helpful. She is still looking for a sponsor. She continues to have back pain that she rates up to 6 out of 10 but states that it is so much better than when she went into the hospital this last time.  Review of Systems: Review of Systems  Constitutional: Positive for malaise/fatigue. Negative for chills, diaphoresis, fever and weight loss.  HENT: Negative  for sore throat.   Respiratory: Negative for cough, sputum production and shortness of breath.   Cardiovascular: Negative for chest pain.  Gastrointestinal: Negative for abdominal pain, diarrhea, heartburn, nausea and vomiting.  Genitourinary: Negative for dysuria and frequency.  Musculoskeletal: Positive for back pain. Negative for joint pain and myalgias.  Skin: Negative for itching and rash.  Neurological: Negative for dizziness and headaches.  Psychiatric/Behavioral: Positive for depression and substance abuse. The patient is not nervous/anxious.     Past Medical  History:  Diagnosis Date  . Abscess of skin    buttock - most recently 2009/10  . Drug-seeking behavior   . IV drug user   . MRSA (methicillin resistant Staphylococcus aureus) infection     Social History  Substance Use Topics  . Smoking status: Current Every Day Smoker    Packs/day: 1.00    Types: Cigarettes  . Smokeless tobacco: Never Used  . Alcohol use No    Family History  Problem Relation Age of Onset  . Alcoholism Father     Allergies  Allergen Reactions  . Vancomycin     Possible contributor to AKI and thrombocytopenia    Objective: Vitals:   08/24/16 1425  BP: 109/70  Pulse: 75  Temp: 98.5 F (36.9 C)  TempSrc: Oral  Weight: 96 lb (43.5 kg)  Height: 5\' 2"  (1.575 m)   Body mass index is 17.56 kg/m.  Physical Exam  Constitutional: She is oriented to person, place, and time.  She is very thin. She is pleasant and in no distress.  HENT:  Mouth/Throat: No oropharyngeal exudate.  Eyes: Conjunctivae are normal.  Cardiovascular: Normal rate and regular rhythm.   No murmur heard. Pulmonary/Chest: Effort normal and breath sounds normal. She has no wheezes. She has no rales.  Abdominal: Soft. She exhibits no mass. There is no tenderness.  Massive splenomegaly.  Musculoskeletal: Normal range of motion. She exhibits no edema or tenderness.  Marked kyphotic deformity with posterior protrusion at T12-L1.  Neurological: She is alert and oriented to person, place, and time.  Skin: No rash noted.  Slowly healing track marks and infected sites healing on hands and forearms.  Psychiatric: Mood and affect normal.    Lab Results    Problem List Items Addressed This Visit      High   Osteomyelitis of vertebra of thoracolumbar region [facility])    I will check repeat CBC, BMP, sedimentation rate and C-reactive protein today. She will continue doxycycline and I will try to arrange a dose of IV dalbavancin. This vertebral infection has been smoldering for many months.  She has erosion of T12 and collapse of L1 on L2. She has a kyphotic deformity radiographically and on exam. She will follow-up with me in 4 weeks.      Relevant Orders   CBC   Basic metabolic panel   C-reactive protein   Sedimentation rate     Medium   Intravenous drug abuse, continuous    I encouraged her to continue going to her counseling program and using Suboxone. I also encouraged her to get a sponsor and start attending more AA and NA meetings. She seems motivated to try to quit injecting and using drugs off the street.        Unprioritized   Hepatitis C antibody test positive    She is hepatitis C antibody positive. Her hepatitis C viral load was negative in 2014 but she has had active IV drug use since that time. I will repeat  her viral load today.      Relevant Orders   Hepatitis C RNA quantitative       Michel Bickers, MD [facility] for [place] [facility] [phone] pager   [phone] cell 08/24/2016, 2:59 PM

## 2016-08-24 NOTE — Assessment & Plan Note (Signed)
I will check repeat CBC, BMP, sedimentation rate and C-reactive protein today. She will continue doxycycline and I will try to arrange a dose of IV dalbavancin. This vertebral infection has been smoldering for many months. She has erosion of T12 and collapse of L1 on L2. She has a kyphotic deformity radiographically and on exam. She will follow-up with me in 4 weeks.

## 2016-08-24 NOTE — Assessment & Plan Note (Signed)
She is hepatitis C antibody positive. Her hepatitis C viral load was negative in 2014 but she has had active IV drug use since that time. I will repeat her viral load today.

## 2016-08-25 ENCOUNTER — Other Ambulatory Visit: Payer: Self-pay

## 2016-08-25 ENCOUNTER — Telehealth: Payer: Self-pay | Admitting: *Deleted

## 2016-08-25 DIAGNOSIS — M4625 Osteomyelitis of vertebra, thoracolumbar region: Secondary | ICD-10-CM

## 2016-08-25 LAB — BASIC METABOLIC PANEL
BUN: 23 mg/dL (ref 7–25)
CALCIUM: 9 mg/dL (ref 8.6–10.2)
CO2: 26 mmol/L (ref 20–31)
Chloride: 100 mmol/L (ref 98–110)
Creat: 0.79 mg/dL (ref 0.50–1.10)
GLUCOSE: 114 mg/dL — AB (ref 65–99)
Potassium: 3.9 mmol/L (ref 3.5–5.3)
Sodium: 136 mmol/L (ref 135–146)

## 2016-08-25 LAB — CBC
HEMATOCRIT: 28.8 % — AB (ref 35.0–45.0)
HEMOGLOBIN: 9.2 g/dL — AB (ref 11.7–15.5)
MCH: 25.5 pg — AB (ref 27.0–33.0)
MCHC: 31.9 g/dL — AB (ref 32.0–36.0)
MCV: 79.8 fL — AB (ref 80.0–100.0)
MPV: 9.1 fL (ref 7.5–12.5)
Platelets: 120 10*3/uL — ABNORMAL LOW (ref 140–400)
RBC: 3.61 MIL/uL — ABNORMAL LOW (ref 3.80–5.10)
RDW: 16.8 % — ABNORMAL HIGH (ref 11.0–15.0)
WBC: 1.9 10*3/uL — ABNORMAL LOW (ref 3.8–10.8)

## 2016-08-25 LAB — C-REACTIVE PROTEIN: CRP: 1.1 mg/L (ref <8.0)

## 2016-08-25 LAB — SEDIMENTATION RATE: Sed Rate: 90 mm/hr — ABNORMAL HIGH (ref 0–20)

## 2016-08-25 MED ORDER — DALBAVANCIN HCL 500 MG IV SOLR: 1500.0000 mg | Freq: Once | INTRAVENOUS | Status: DC

## 2016-08-25 NOTE — Telephone Encounter (Signed)
Patient notified of her appt at [facility] for tomorrow at 1:00 pm. They are aware this is a one time dose of Dalbavancin and Dr. Megan Salon has medication directions in EPIC. Molly [name]

## 2016-08-26 ENCOUNTER — Ambulatory Visit (HOSPITAL_COMMUNITY): Payer: Self-pay

## 2016-08-26 ENCOUNTER — Other Ambulatory Visit (HOSPITAL_COMMUNITY): Payer: Self-pay | Admitting: *Deleted

## 2016-08-26 MED ORDER — DEXTROSE 5 % IV SOLN
Freq: Once | INTRAVENOUS | Status: AC
  Administered 2016-08-26: 15:00:00 via INTRAVENOUS
  Filled 2016-08-26: qty 0

## 2016-08-26 NOTE — Discharge Instructions (Signed)
Please see physician at [place] with any questions concerning follow-up from IV infusion of Dalbavancin.

## 2016-08-26 NOTE — Procedures (Addendum)
[facility]  Procedure Note  Molly [name] ZOX:[id number] DOB: April 28, 1986 DOA: 07/21/2016   Ordering Provider: Michel Bickers, MD  Associated Diagnosis: Osteomyelitis of vertebra of thoracolumbar region  Procedure Note: IV infusion of Dalbavancin   Condition During Procedure: Pt tolerated well; no complications noted   Condition at Discharge: Pt alert and oriented; ambulatory;    Nigel Sloop, RN  [facility]

## 2016-08-27 LAB — HEPATITIS C RNA QUANTITATIVE: HCV Quantitative: NOT DETECTED IU/mL (ref <15)

## 2016-08-30 ENCOUNTER — Telehealth: Payer: Self-pay | Admitting: Pharmacist Clinician (PhC)/ Clinical Pharmacy Specialist

## 2016-08-30 NOTE — Telephone Encounter (Signed)
Edelin got her Dalvance on 11/9. Since it's backordered, we have to our supply. She was approved for it. Faxed the request for replacement today. Prob won't be shipped out until late Dec.

## 2016-09-23 ENCOUNTER — Encounter: Payer: Self-pay | Admitting: Internal Medicine

## 2016-09-23 ENCOUNTER — Ambulatory Visit (INDEPENDENT_AMBULATORY_CARE_PROVIDER_SITE_OTHER): Payer: Self-pay | Admitting: Internal Medicine

## 2016-09-23 DIAGNOSIS — L03113 Cellulitis of right upper limb: Secondary | ICD-10-CM

## 2016-09-23 DIAGNOSIS — M4625 Osteomyelitis of vertebra, thoracolumbar region: Secondary | ICD-10-CM

## 2016-09-23 DIAGNOSIS — R768 Other specified abnormal immunological findings in serum: Secondary | ICD-10-CM

## 2016-09-23 DIAGNOSIS — F1910 Other psychoactive substance abuse, uncomplicated: Secondary | ICD-10-CM

## 2016-09-23 DIAGNOSIS — L03011 Cellulitis of right finger: Secondary | ICD-10-CM

## 2016-09-23 MED ORDER — DOXYCYCLINE HYCLATE 100 MG PO CAPS: 100.0000 mg | ORAL_CAPSULE | Freq: Two times a day (BID) | ORAL | 1 refills | Status: DC

## 2016-09-23 NOTE — Assessment & Plan Note (Signed)
Cellulitis and abscesses on her arms have resolved.

## 2016-09-23 NOTE — Assessment & Plan Note (Signed)
I congratulated her on getting a sponsor and talking to her frequently. I encouraged her to attend regular AA and NA meetings especially since she will have to come off of Suboxone soon.

## 2016-09-23 NOTE — Assessment & Plan Note (Signed)
She is responding slowly but well to antibiotic therapy for chronic, smoldering MRSA vertebral infection. Her inflammatory markers are improving. I will have her stay on doxycycline and follow-up in one month.

## 2016-09-23 NOTE — Progress Notes (Signed)
[place] for Infectious Disease  Patient Active Problem List   Diagnosis Date Noted  . Osteomyelitis of vertebra of thoracolumbar region [facility])     Priority: High  . 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 Septic pulmonary embolism ([place]) 07/03/2013    Priority: High  . MRSA bacteremia 07/03/2013    Priority: High  . Intravenous drug abuse, continuous 07/03/2013    Priority: Medium  . Hepatitis C antibody test positive 08/24/2016  . Splenomegaly   . Absolute anemia   . Polysubstance abuse   . Cocaine use 11/28/2015  . Pancytopenia ([place]) 11/27/2015  . AKI (acute kidney injury) ([place]) 11/27/2015  . Anasarca 11/27/2015  . Cellulitis of multiple sites of right hand and fingers 07/07/2013  . Anemia 07/04/2013  . Pulmonary HTN ([place]) 07/04/2013  . 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   IBUPROFEN (ADVIL,MOTRIN) 200 MG TABLET    Take 600 mg by mouth every 6 (six) hours as needed for moderate pain.   SENNA-DOCUSATE (SENOKOT-S) 8.6-50 MG TABLET    Take 1 tablet by mouth at bedtime as needed for mild constipation.  Modified Medications   Modified Medication Previous Medication   DOXYCYCLINE (VIBRAMYCIN) 100 MG CAPSULE doxycycline (VIBRAMYCIN) 100 MG capsule      Take 1 capsule (100 mg total) by mouth 2 (two) times daily.    Take 1 capsule (100 mg total) by mouth 2 (two) times daily.  Discontinued Medications   TRAZODONE (DESYREL) 50 MG TABLET    Take 1 tablet (50 mg total) by mouth at bedtime as needed for sleep.    Subjective: Molly [name] is in for her routine follow-up visit. She has continued to take doxycycline. She received a dose of dalbavancin on 10/27/2015 for her chronic MRSA  vertebral infection. She still having some mid back pain but is feeling much better. She is very weak. She lost her insurance so she has been having difficulty affording her weekly Suboxone. It costs her $300 per week. She is slowly rationing what she has left. She is not attending NA or AA meetings on a regular basis. She has found a sponsor though when talks to her at least twice a day. She has been feeling depressed. She has not bought any narcotics on the street and has not injected any drugs since her last visit. She has completed 62 days of antibiotic therapy with this latest round of antibiotics.  Review of Systems: Review of Systems  Constitutional: Negative for chills, diaphoresis, fever, malaise/fatigue and weight loss.  HENT: Negative for sore throat.   Respiratory: Negative for cough, sputum production and shortness of breath.   Cardiovascular: Negative for chest pain.  Gastrointestinal: Negative for abdominal pain, diarrhea, heartburn, nausea and vomiting.  Genitourinary: Negative for dysuria and frequency.  Musculoskeletal: Positive for back pain. Negative for joint pain and myalgias.  Skin: Negative for rash.  Neurological: Negative for dizziness and headaches.  Psychiatric/Behavioral: Positive for depression. Negative for substance abuse. The patient is not nervous/anxious.     Past Medical History:  Diagnosis Date  . Abscess of skin    buttock - most recently 2009/10  . Drug-seeking behavior   .  IV drug user   . MRSA (methicillin resistant Staphylococcus aureus) infection     Social History  Substance Use Topics  . Smoking status: Current Every Day Smoker    Packs/day: 0.50    Years: 7.00    Types: Cigarettes  . Smokeless tobacco: Never Used     Comment: slowing down  . Alcohol use No    Family History  Problem Relation Age of Onset  . Alcoholism Father     Allergies  Allergen Reactions  . Vancomycin     Possible contributor to AKI and thrombocytopenia     Objective: Vitals:   09/23/16 1513  BP: 110/66  Pulse: 89  Temp: 98.1 F (36.7 C)  TempSrc: Oral  Weight: 98 lb (44.5 kg)  Height: 5' 1.5" (1.562 m)   Body mass index is 18.22 kg/m.  Physical Exam  Constitutional: She is oriented to person, place, and time.  She is looking much better. She is tearful during some of the exam.  HENT:  Mouth/Throat: No oropharyngeal exudate.  Eyes: Conjunctivae are normal.  Cardiovascular: Normal rate and regular rhythm.   No murmur heard. Pulmonary/Chest: Breath sounds normal.  Abdominal: Soft. She exhibits no mass. There is no tenderness.  Musculoskeletal: Normal range of motion.  No change in the kyphotic deformity of her mid spine.  Neurological: She is alert and oriented to person, place, and time.  Skin: No rash noted.  Psychiatric: Mood and affect normal.    Lab Results Sed Rate (mm/hr)  Date Value  08/24/2016 90 (H)  11/28/2015 132 (H)   CRP (mg/L)  Date Value  08/24/2016 1.1     Problem List Items Addressed This Visit      High   Osteomyelitis of vertebra of thoracolumbar region [facility])    She is responding slowly but well to antibiotic therapy for chronic, smoldering MRSA vertebral infection. Her inflammatory markers are improving. I will have her stay on doxycycline and follow-up in one month.      Relevant Orders   C-reactive protein   Sedimentation rate     Medium   Intravenous drug abuse, continuous    I congratulated her on getting a sponsor and talking to her frequently. I encouraged her to attend regular AA and NA meetings especially since she will have to come off of Suboxone soon.        Unprioritized   Cellulitis of multiple sites of right hand and fingers    Cellulitis and abscesses on her arms have resolved.      Hepatitis C antibody test positive    Her hepatitis C viral load was undetectable.          Michel Bickers, MD [facility] for Infectious [place]  [facility] [phone] pager   [phone] cell 09/23/2016, 3:46 PM

## 2016-09-23 NOTE — Patient Instructions (Signed)
The chronic infection of your spine has caused kyphosis or curvature of the spine.

## 2016-09-23 NOTE — Assessment & Plan Note (Signed)
Her hepatitis C viral load was undetectable.

## 2016-09-24 LAB — SEDIMENTATION RATE: SED RATE: 81 mm/h — AB (ref 0–20)

## 2016-09-24 LAB — C-REACTIVE PROTEIN: CRP: 1.5 mg/L (ref <8.0)

## 2016-11-02 ENCOUNTER — Ambulatory Visit (INDEPENDENT_AMBULATORY_CARE_PROVIDER_SITE_OTHER): Payer: Self-pay | Admitting: Internal Medicine

## 2016-11-02 DIAGNOSIS — M4625 Osteomyelitis of vertebra, thoracolumbar region: Secondary | ICD-10-CM

## 2016-11-02 DIAGNOSIS — F1910 Other psychoactive substance abuse, uncomplicated: Secondary | ICD-10-CM

## 2016-11-02 DIAGNOSIS — J069 Acute upper respiratory infection, unspecified: Secondary | ICD-10-CM

## 2016-11-02 LAB — BASIC METABOLIC PANEL
BUN: 19 mg/dL (ref 7–25)
CO2: 28 mmol/L (ref 20–31)
Calcium: 8.7 mg/dL (ref 8.6–10.2)
Chloride: 98 mmol/L (ref 98–110)
Creat: 0.97 mg/dL (ref 0.50–1.10)
Glucose, Bld: 110 mg/dL — ABNORMAL HIGH (ref 65–99)
POTASSIUM: 4.3 mmol/L (ref 3.5–5.3)
Sodium: 134 mmol/L — ABNORMAL LOW (ref 135–146)

## 2016-11-02 LAB — CBC
HCT: 30.9 % — ABNORMAL LOW (ref 35.0–45.0)
Hemoglobin: 10.2 g/dL — ABNORMAL LOW (ref 11.7–15.5)
MCH: 25.6 pg — ABNORMAL LOW (ref 27.0–33.0)
MCHC: 33 g/dL (ref 32.0–36.0)
MCV: 77.4 fL — ABNORMAL LOW (ref 80.0–100.0)
MPV: 8.8 fL (ref 7.5–12.5)
Platelets: 81 10*3/uL — ABNORMAL LOW (ref 140–400)
RBC: 3.99 MIL/uL (ref 3.80–5.10)
RDW: 15.1 % — ABNORMAL HIGH (ref 11.0–15.0)
WBC: 2.2 10*3/uL — ABNORMAL LOW (ref 3.8–10.8)

## 2016-11-02 NOTE — Progress Notes (Signed)
[place] for Infectious Disease  Patient Active Problem List   Diagnosis Date Noted  . Osteomyelitis of vertebra of thoracolumbar region [facility])     Priority: High  . 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 Septic pulmonary embolism ([place]) 07/03/2013    Priority: High  . MRSA bacteremia 07/03/2013    Priority: High  . Intravenous drug abuse, continuous 07/03/2013    Priority: Medium  . Acute upper respiratory infection 11/02/2016  . Hepatitis C antibody test positive 08/24/2016  . Splenomegaly   . Polysubstance abuse   . Cocaine use 11/28/2015  . Pancytopenia ([place]) 11/27/2015  . AKI (acute kidney injury) ([place]) 11/27/2015  . Anasarca 11/27/2015  . Cellulitis of multiple sites of right hand and fingers 07/07/2013  . Anemia 07/04/2013  . Pulmonary HTN ([place]) 07/04/2013  . 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   DOXYCYCLINE (VIBRAMYCIN) 100 MG CAPSULE    Take 1 capsule (100 mg total) by mouth 2 (two) times daily.   IBUPROFEN (ADVIL,MOTRIN) 200 MG TABLET    Take 600 mg by mouth every 6 (six) hours as needed for moderate pain.   SENNA-DOCUSATE (SENOKOT-S) 8.6-50 MG TABLET    Take 1 tablet by mouth at bedtime as needed for mild constipation.  Modified Medications   No medications on file  Discontinued Medications   No medications on file    Subjective: Molly [name] is in for her routine follow-up visit. She has been struggling with MRSA infection for a little over one year. Last January she was diagnosed with MRSA endocarditis with small vegetations on her tricuspid and aortic valves. Because of her addiction and she had multiple hospitalizations with spotty an inadequate therapy. She was readmitted in October with extensive vertebral  infection from T10-S1. An aspirate grew MRSA again. She received several days of IV vancomycin and one dose of oritavancin. She has been on doxycycline for the past 3 months. She is tolerating it well. Her back pain is slowly improving.  Unfortunately she has not been able to afford Suboxone and has had to quit it. She did have a relapse of drug use over the holidays. She denies any injecting drug use but did take Opana tablets on 3 occasions. She is very motivated to get back into treatment and would like an inpatient treatment facility. She is asking for any advice or leads.  Over the past 3-4 days she has had sinus congestion and cough. She has not had any fever or shortness of breath. She has taken some NyQuil.  Review of Systems: Review of Systems  Constitutional: Positive for malaise/fatigue. Negative for chills, diaphoresis, fever and weight loss.  HENT: Positive for congestion. Negative for sore throat.   Respiratory: Positive for cough. Negative for sputum production and shortness of breath.   Cardiovascular: Negative for chest pain.  Gastrointestinal: Negative for abdominal pain, diarrhea, heartburn, nausea and vomiting.  Genitourinary: Negative for dysuria and frequency.  Musculoskeletal: Negative for joint pain and myalgias.  Skin: Negative for rash.  Neurological: Negative for dizziness and headaches.  Psychiatric/Behavioral: Positive for substance abuse. Negative for depression. The patient is not nervous/anxious.     Past Medical History:  Diagnosis Date  . Abscess of skin  buttock - most recently 2009/10  . Drug-seeking behavior   . IV drug user   . MRSA (methicillin resistant Staphylococcus aureus) infection     Social History  Substance Use Topics  . Smoking status: Current Every Day Smoker    Packs/day: 0.50    Years: 7.00    Types: Cigarettes  . Smokeless tobacco: Never Used     Comment: slowing down  . Alcohol use No    Family History  Problem Relation  Age of Onset  . Alcoholism Father     Allergies  Allergen Reactions  . Vancomycin     Possible contributor to AKI and thrombocytopenia    Objective: Vitals:   11/02/16 1534  BP: 98/62  Pulse: 78  Temp: 98.7 F (37.1 C)  TempSrc: Oral  Weight: 99 lb (44.9 kg)   Body mass index is 18.4 kg/m.  Physical Exam  Constitutional: She is oriented to person, place, and time.  She sounds congested. She is a little under the weather with her cold but otherwise in good spirits  HENT:  Mouth/Throat: No oropharyngeal exudate.  Eyes: Conjunctivae are normal.  Cardiovascular: Normal rate and regular rhythm.   No murmur heard. Pulmonary/Chest: Effort normal and breath sounds normal. She has no wheezes. She has no rales.  Abdominal: Soft. There is no tenderness.  Musculoskeletal: Normal range of motion.  No change in small kyphotic deformity of her mid spine.  Neurological: She is alert and oriented to person, place, and time.  Skin: No rash noted.  Psychiatric: Mood and affect normal.    Lab Results BMET    Component Value Date/Time   NA 136 08/24/2016 1706   K 3.9 08/24/2016 1706   CL 100 08/24/2016 1706   CO2 26 08/24/2016 1706   GLUCOSE 114 (H) 08/24/2016 1706   BUN 23 08/24/2016 1706   CREATININE 0.79 08/24/2016 1706   CALCIUM 9.0 08/24/2016 1706   GFRNONAA >60 07/20/2016 0613   GFRAA >60 07/20/2016 3810   Lab Results  Component Value Date   WBC 1.9 (L) 08/24/2016   HGB 9.2 (L) 08/24/2016   HCT 28.8 (L) 08/24/2016   MCV 79.8 (L) 08/24/2016   PLT 120 (L) 08/24/2016   Sed Rate (mm/hr)  Date Value  09/23/2016 81 (H)  08/24/2016 90 (H)  11/28/2015 132 (H)   CRP (mg/L)  Date Value  09/23/2016 1.5  08/24/2016 1.1     Problem List Items Addressed This Visit      High   Osteomyelitis of vertebra of thoracolumbar region [facility])    She is slowly improving but has extensive infection that has not been able to be treated with standard courses of intravenous  antibiotics. She is tolerating doxycycline and I will continue it for now. I will repeat her lab work and see her back in 2 months.      Relevant Orders   CBC   Basic metabolic panel   C-reactive protein   Sedimentation rate     Medium   Intravenous drug abuse, continuous    I had her meet with Shawna Orleans, our case manager, about resources that are available for drug treatment.        Unprioritized   Acute upper respiratory infection    She has a simple head cold. I talked to her about symptomatic therapy with over-the-counter products.          Michel Bickers, MD [facility] for Infectious [place] [facility] [phone] pager  [phone] cell 11/02/2016, 4:09 PM

## 2016-11-02 NOTE — Assessment & Plan Note (Signed)
She is slowly improving but has extensive infection that has not been able to be treated with standard courses of intravenous antibiotics. She is tolerating doxycycline and I will continue it for now. I will repeat her lab work and see her back in 2 months.

## 2016-11-02 NOTE — Assessment & Plan Note (Signed)
She has a simple head cold. I talked to her about symptomatic therapy with over-the-counter products.

## 2016-11-02 NOTE — Assessment & Plan Note (Signed)
I had her meet with Shawna Orleans, our case manager, about resources that are available for drug treatment.

## 2016-11-03 LAB — SEDIMENTATION RATE: SED RATE: 74 mm/h — AB (ref 0–20)

## 2016-11-03 LAB — C-REACTIVE PROTEIN: CRP: 1.5 mg/L (ref <8.0)

## 2016-11-12 ENCOUNTER — Other Ambulatory Visit: Payer: Self-pay

## 2016-11-12 ENCOUNTER — Telehealth: Payer: Self-pay | Admitting: *Deleted

## 2016-11-12 NOTE — Telephone Encounter (Signed)
Patient paged Dr Baxter Flattery while clinic phones were down, asking for referral to rehab. Please advise.  Landis Gandy, RN

## 2016-11-15 ENCOUNTER — Telehealth: Payer: Self-pay

## 2016-11-15 ENCOUNTER — Encounter: Payer: Self-pay | Admitting: Internal Medicine

## 2016-11-15 NOTE — Telephone Encounter (Signed)
Left message asking her to call back.  She left a message with the front desk asking for a referral to [place]?  Carolee Rota also called patient regarding letter ready for pick up.

## 2016-11-15 NOTE — Telephone Encounter (Signed)
I had Molly [name] meet with Molly [name] at the time of her last visit to go over local resources for substance abuse treatment and rehabilitation. Please have her call Amy if she has any questions. If she needs a referral to a specific program I would be happy to do that.

## 2016-11-15 NOTE — Telephone Encounter (Signed)
Called Molly [name]' to tell her the letter she requested for [facility] is now ready and waiting in the office. No answer, left message asking pt to call me back when she gets a moment.

## 2017-01-17 ENCOUNTER — Ambulatory Visit: Payer: Self-pay | Admitting: Internal Medicine

## 2017-01-25 ENCOUNTER — Ambulatory Visit: Payer: Self-pay | Admitting: Internal Medicine

## 2017-02-03 ENCOUNTER — Encounter: Payer: Self-pay | Admitting: Internal Medicine

## 2017-02-03 ENCOUNTER — Ambulatory Visit (INDEPENDENT_AMBULATORY_CARE_PROVIDER_SITE_OTHER): Payer: Self-pay | Admitting: Internal Medicine

## 2017-02-03 DIAGNOSIS — M4625 Osteomyelitis of vertebra, thoracolumbar region: Secondary | ICD-10-CM

## 2017-02-03 NOTE — Progress Notes (Signed)
[place] for Infectious Disease  Patient Active Problem List   Diagnosis Date Noted  . Osteomyelitis of vertebra of thoracolumbar region [facility])     Priority: High  . 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 Septic pulmonary embolism ([place]) 07/03/2013    Priority: High  . MRSA bacteremia 07/03/2013    Priority: High  . Intravenous drug abuse, continuous 07/03/2013    Priority: Medium  . Acute upper respiratory infection 11/02/2016  . Hepatitis C antibody test positive 08/24/2016  . Splenomegaly   . Polysubstance abuse   . Cocaine use 11/28/2015  . Pancytopenia ([place]) 11/27/2015  . AKI (acute kidney injury) ([place]) 11/27/2015  . Anasarca 11/27/2015  . Cellulitis of multiple sites of right hand and fingers 07/07/2013  . Anemia 07/04/2013  . Pulmonary HTN ([place]) 07/04/2013  . 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   DOXYCYCLINE (VIBRAMYCIN) 100 MG CAPSULE    Take 1 capsule (100 mg total) by mouth 2 (two) times daily.   IBUPROFEN (ADVIL,MOTRIN) 200 MG TABLET    Take 600 mg by mouth every 6 (six) hours as needed for moderate pain.   SENNA-DOCUSATE (SENOKOT-S) 8.6-50 MG TABLET    Take 1 tablet by mouth at bedtime as needed for mild constipation.  Modified Medications   No medications on file  Discontinued Medications   No medications on file    Subjective: Molly [name] is in for her routine follow-up visit. She remains on doxycycline for her severe systemic MRSA infection complicated by extensive vertebral infection. Her last positive culture was in October. She is having some problem with nausea with her a.m. dose of doxycycline. She generally takes it on an empty stomach in the morning. An evening she takes it with food and has no problem  with nausea. She did complete a 26 day inpatient drug treatment program. She is not on Suboxone and is not attending any aftercare programs but has been able to remain drug free. She is only taking ibuprofen for her back pain.  Review of Systems: Review of Systems  Constitutional: Negative for chills, diaphoresis, fever, malaise/fatigue and weight loss.  HENT: Negative for sore throat.   Respiratory: Negative for cough, sputum production and shortness of breath.   Cardiovascular: Negative for chest pain.  Gastrointestinal: Positive for nausea. Negative for abdominal pain, diarrhea, heartburn and vomiting.  Genitourinary: Negative for dysuria and frequency.  Musculoskeletal: Positive for back pain. Negative for joint pain and myalgias.  Skin: Negative for rash.  Neurological: Negative for dizziness and headaches.  Psychiatric/Behavioral: Negative for depression and substance abuse. The patient is not nervous/anxious.     Past Medical History:  Diagnosis Date  . Abscess of skin    buttock - most recently 2009/10  . Drug-seeking behavior   . IV drug user   . MRSA (methicillin resistant Staphylococcus aureus) infection     Social History  Substance Use Topics  . Smoking status: Current Every Day Smoker    Packs/day: 0.50    Years: 7.00    Types: Cigarettes  . Smokeless tobacco: Never Used     Comment: slowing down  . Alcohol use No    Family History  Problem Relation Age of Onset  .  Alcoholism Father     Allergies  Allergen Reactions  . Vancomycin     Possible contributor to AKI and thrombocytopenia    Objective: Vitals:   02/03/17 1500  BP: 106/74  Pulse: 83  Temp: 98.4 F (36.9 C)  TempSrc: Oral  Weight: 94 lb (42.6 kg)  Height: 5\' 2"  (1.575 m)   Body mass index is 17.19 kg/m.  Physical Exam  Constitutional: She is oriented to person, place, and time.  She is in good spirits.  Cardiovascular: Normal rate and regular rhythm.   No murmur  heard. Pulmonary/Chest: Effort normal and breath sounds normal.  Abdominal: Soft. There is no tenderness.  Musculoskeletal:  She has a stable kyphotic deformity over her lumbar spine. There is no redness or other sign of active inflammation.  Neurological: She is alert and oriented to person, place, and time.  Skin: No rash noted.  Psychiatric: Mood and affect normal.    Lab Results Sed Rate (mm/hr)  Date Value  11/02/2016 74 (H)  09/23/2016 81 (H)  08/24/2016 90 (H)   CRP (mg/L)  Date Value  11/02/2016 1.5  09/23/2016 1.5  08/24/2016 1.1     Problem List Items Addressed This Visit      High   Osteomyelitis of vertebra of thoracolumbar region [facility])    She has extensive MRSA vertebral infection. Her sedimentation rate has been coming down but remained elevated 3 months ago. I will repeat lab work today. I instructed her to take her a.m. dose of doxycycline with food. She will follow-up in 6 weeks.      Relevant Orders   C-reactive protein   Sedimentation rate       Michel Bickers, MD [facility] for Infectious [place] [phone] pager   [phone] cell 02/03/2017, 3:19 PM

## 2017-02-03 NOTE — Assessment & Plan Note (Signed)
She has extensive MRSA vertebral infection. Her sedimentation rate has been coming down but remained elevated 3 months ago. I will repeat lab work today. I instructed her to take her a.m. dose of doxycycline with food. She will follow-up in 6 weeks.

## 2017-02-04 LAB — SEDIMENTATION RATE: Sed Rate: 51 mm/hr — ABNORMAL HIGH (ref 0–20)

## 2017-02-04 LAB — C-REACTIVE PROTEIN: CRP: 1.6 mg/L (ref <8.0)

## 2017-03-17 ENCOUNTER — Ambulatory Visit: Payer: Self-pay | Admitting: Internal Medicine

## 2017-07-05 ENCOUNTER — Telehealth: Payer: Self-pay | Admitting: *Deleted

## 2017-07-05 ENCOUNTER — Other Ambulatory Visit: Payer: Self-pay | Admitting: Internal Medicine

## 2017-07-05 NOTE — Telephone Encounter (Signed)
Received refill request from pharmacy for doxycyline.  Patient was last seen 4/19, was supposed to follow up in 5 weeks (no showed). Doxycycline was written 09/23/16 #60 with 1 refill, although today patient states she has only been out for 3 weeks. She is requesting refill because her "back is hurting, maybe like the infection is back, but taking the antibiotic makes that go away."  Pt advised she would need an appointment for refills, she is scheduled for 9/25. Landis Gandy, RN

## 2017-07-12 ENCOUNTER — Ambulatory Visit (INDEPENDENT_AMBULATORY_CARE_PROVIDER_SITE_OTHER): Payer: Self-pay | Admitting: Internal Medicine

## 2017-07-12 ENCOUNTER — Encounter: Payer: Self-pay | Admitting: Internal Medicine

## 2017-07-12 DIAGNOSIS — L309 Dermatitis, unspecified: Secondary | ICD-10-CM

## 2017-07-12 DIAGNOSIS — Z23 Encounter for immunization: Secondary | ICD-10-CM

## 2017-07-12 DIAGNOSIS — M4625 Osteomyelitis of vertebra, thoracolumbar region: Secondary | ICD-10-CM

## 2017-07-12 DIAGNOSIS — F1910 Other psychoactive substance abuse, uncomplicated: Secondary | ICD-10-CM

## 2017-07-12 NOTE — Assessment & Plan Note (Signed)
She has been on very long-term antibiotic therapy for her severe systemic MRSA infection complicated . Her last positive culture was in October of last year. I will have her stay off of antibiotics for now. She will follow up in 8 weeks.

## 2017-07-12 NOTE — Assessment & Plan Note (Signed)
I congratulated her on being drug free.

## 2017-07-12 NOTE — Progress Notes (Signed)
[place] for Infectious Disease  Patient Active Problem List   Diagnosis Date Noted  . Osteomyelitis of vertebra of thoracolumbar region [facility])     Priority: High  . 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 Septic pulmonary embolism ([place]) 07/03/2013    Priority: High  . MRSA bacteremia 07/03/2013    Priority: High  . Intravenous drug abuse, continuous 07/03/2013    Priority: Medium  . Eczema 07/12/2017  . Acute upper respiratory infection 11/02/2016  . Hepatitis C antibody test positive 08/24/2016  . Splenomegaly   . Polysubstance abuse   . Cocaine use 11/28/2015  . Pancytopenia ([place]) 11/27/2015  . AKI (acute kidney injury) ([place]) 11/27/2015  . Anasarca 11/27/2015  . Cellulitis of multiple sites of right hand and fingers 07/07/2013  . Anemia 07/04/2013  . Pulmonary HTN ([place]) 07/04/2013  . 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   IBUPROFEN (ADVIL,MOTRIN) 200 MG TABLET    Take 600 mg by mouth every 6 (six) hours as needed for moderate pain.   SENNA-DOCUSATE (SENOKOT-S) 8.6-50 MG TABLET    Take 1 tablet by mouth at bedtime as needed for mild constipation.  Modified Medications   No medications on file  Discontinued Medications   DOXYCYCLINE (VIBRAMYCIN) 100 MG CAPSULE    Take 1 capsule (100 mg total) by mouth 2 (two) times daily.    Subjective: Lassie is in for her routine follow-up visit. She ran out of her doxycycline 3 weeks ago. She says that she had not been taking it consistently before she ran out. She says she would take it as needed but did not really now what she meant by that. She has not noted any change in the bony prominence in her mid back. She is not having any back pain. She states that she will occasionally  notice some low pelvic pain after having intercourse. She's had an IUD in place for over a decade. She has not been seen by a gynecologist in 10 years. She denies any drug use. She is not in any aftercare programs. Her brother is on prescription Suboxone which she has been sharing with him. She says that it definitely helps blunt her cravings.  Review of Systems: Review of Systems  Constitutional: Negative for chills, diaphoresis, fever, malaise/fatigue and weight loss.  HENT: Negative for sore throat.   Respiratory: Negative for cough, sputum production and shortness of breath.   Cardiovascular: Negative for chest pain.  Gastrointestinal: Positive for abdominal pain. Negative for diarrhea, heartburn, nausea and vomiting.  Genitourinary: Negative for dysuria and frequency.  Musculoskeletal: Negative for joint pain and myalgias.  Skin: Positive for itching and rash.  Neurological: Negative for dizziness and headaches.  Psychiatric/Behavioral: Negative for depression and substance abuse. The patient is not nervous/anxious.     Past Medical History:  Diagnosis Date  . Abscess of skin    buttock - most recently 2009/10  . Drug-seeking behavior   . IV drug user   . MRSA (methicillin resistant Staphylococcus aureus) infection     Social History  Substance Use Topics  . Smoking status: Current Every Day Smoker    Packs/day: 0.50    Years: 7.00    Types: Cigarettes  .  Smokeless tobacco: Never Used     Comment: slowing down  . Alcohol use No    Family History  Problem Relation Age of Onset  . Alcoholism Father     Allergies  Allergen Reactions  . Vancomycin     Possible contributor to AKI and thrombocytopenia    Objective: Vitals:   07/12/17 1027  BP: 94/64  Pulse: 74  Temp: 97.8 F (36.6 C)  TempSrc: Oral   There is no height or weight on file to calculate BMI.  Physical Exam  Constitutional: She is oriented to person, place, and time.  She is in good spirits.    HENT:  Mouth/Throat: No oropharyngeal exudate.  Eyes: Conjunctivae are normal.  Cardiovascular: Normal rate and regular rhythm.   No murmur heard. Pulmonary/Chest: Breath sounds normal.  Abdominal: Soft. She exhibits no mass. There is no tenderness.  Musculoskeletal: Normal range of motion.  No change in the bony prominence in her mid spine.  Neurological: She is alert and oriented to person, place, and time.  Skin: No rash noted.  She has some dry, erythematous scaly patches in her umbilicus and left lower abdomen. She has an excoriation at the base of her left fourth finger and around that fingers nail bed. She also as some excoriations in her right ear where she has been scratching.  Psychiatric: Mood and affect normal.    Lab Results    Problem List Items Addressed This Visit      High   Osteomyelitis of vertebra of thoracolumbar region [facility])    She has been on very long-term antibiotic therapy for her severe systemic MRSA infection complicated . Her last positive culture was in October of last year. I will have her stay off of antibiotics for now. She will follow up in 8 weeks.        Medium   Intravenous drug abuse, continuous    I congratulated her on being drug free.        Unprioritized   Eczema    I suggested that she try over-the-counter cortisone cream for probable eczema.          Michel Bickers, MD [facility] [place] [facility] [phone] pager   [phone] cell 07/12/2017, 10:57 AM

## 2017-07-12 NOTE — Assessment & Plan Note (Signed)
I suggested that she try over-the-counter cortisone cream for probable eczema.

## 2017-09-13 ENCOUNTER — Ambulatory Visit: Payer: Self-pay | Admitting: Internal Medicine

## 2017-09-27 ENCOUNTER — Ambulatory Visit: Payer: Self-pay | Admitting: Internal Medicine

## 2017-09-29 ENCOUNTER — Ambulatory Visit: Payer: Self-pay | Admitting: Internal Medicine

## 2017-10-16 NOTE — Telephone Encounter (Signed)
error 

## 2017-11-14 ENCOUNTER — Ambulatory Visit: Payer: Self-pay | Admitting: Internal Medicine

## 2017-11-22 ENCOUNTER — Ambulatory Visit
Admission: RE | Admit: 2017-11-22 | Discharge: 2017-11-22 | Disposition: A | Discharge status: Home / Self Care | Payer: Self-pay | Source: Ambulatory Visit | Attending: Internal Medicine | Admitting: Internal Medicine

## 2017-11-22 ENCOUNTER — Other Ambulatory Visit: Payer: Self-pay | Admitting: Internal Medicine

## 2017-11-22 ENCOUNTER — Ambulatory Visit (INDEPENDENT_AMBULATORY_CARE_PROVIDER_SITE_OTHER): Payer: Self-pay | Admitting: Internal Medicine

## 2017-11-22 ENCOUNTER — Encounter: Payer: Self-pay | Admitting: Internal Medicine

## 2017-11-22 DIAGNOSIS — M4625 Osteomyelitis of vertebra, thoracolumbar region: Secondary | ICD-10-CM

## 2017-11-22 DIAGNOSIS — R7881 Bacteremia: Secondary | ICD-10-CM

## 2017-11-22 DIAGNOSIS — F1910 Other psychoactive substance abuse, uncomplicated: Secondary | ICD-10-CM

## 2017-11-22 NOTE — Assessment & Plan Note (Signed)
The major underlying problem here is her addiction and relapsed narcotic and injecting drug use.  I certainly support having her enter an inpatient drug treatment program as soon as possible.

## 2017-11-22 NOTE — Progress Notes (Signed)
[place] for Infectious Disease  Patient Active Problem List   Diagnosis Date Noted  . Osteomyelitis of vertebra of thoracolumbar region [facility])     Priority: High  . 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 Septic pulmonary embolism ([place]) 07/03/2013    Priority: High  . MRSA bacteremia 07/03/2013    Priority: High  . Intravenous drug abuse, continuous ([place]) 07/03/2013    Priority: Medium  . Eczema 07/12/2017  . Acute upper respiratory infection 11/02/2016  . Hepatitis C antibody test positive 08/24/2016  . Splenomegaly   . Polysubstance abuse ([place])   . Cocaine use 11/28/2015  . Pancytopenia ([place]) 11/27/2015  . AKI (acute kidney injury) ([place]) 11/27/2015  . Anasarca 11/27/2015  . Cellulitis of multiple sites of right hand and fingers 07/07/2013  . Anemia 07/04/2013  . Pulmonary HTN ([place]) 07/04/2013  . 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   IBUPROFEN (ADVIL,MOTRIN) 200 MG TABLET    Take 600 mg by mouth every 6 (six) hours as needed for moderate pain.   SENNA-DOCUSATE (SENOKOT-S) 8.6-50 MG TABLET    Take 1 tablet by mouth at bedtime as needed for mild constipation.  Modified Medications   No medications on file  Discontinued Medications   No medications on file    Subjective: Molly [name] is in with her mother, Molly [name], for a work in visit.  Molly [name] had MRSA bacteremia complicated by aortic valve endocarditis and vertebral infection in 2017.  She had multiple admissions and a great difficulty completing several courses of antibiotic therapy.  Her last positive culture was in October 2017.  She was on chronic suppressive doxycycline therapy until she decided to stop it in August of last year.  When I saw her shortly after stopping she  was doing well.  She was not having significant pain.  She had been abstinent from injecting drug use or other narcotic use.  She was taking Suboxone that she was getting from her brother.  She tells me that about 2 months ago she broke her right ankle.  She was put in a boot and given 10 Vicodin tablets.  She started using again including Dilaudid that she bought on the street.  She estimates that she has injected drugs about 15 times over the past 2 months.  She used it yesterday.  She has developed a diffuse itching and sores from scratching.  She is having more back pain.  She is worried that her staph infection has come back.  She has not had any fever, chills or sweats.  She is planning on entering an inpatient treatment program in [facility] tomorrow.  Review of Systems: Review of Systems  Constitutional: Positive for malaise/fatigue and weight loss. Negative for chills, diaphoresis and fever.       She is upset and tearful.  HENT: Negative for congestion and sore throat.   Respiratory: Negative for cough, sputum production and shortness of breath.   Cardiovascular: Negative for chest pain.  Gastrointestinal: Negative for abdominal pain, diarrhea, nausea and vomiting.  Musculoskeletal: Positive for back pain and joint pain.  Skin: Positive for itching and rash.  Psychiatric/Behavioral: Positive for depression and substance abuse.    Past Medical History:  Diagnosis Date  . Abscess of skin    buttock - most recently 2009/10  . Drug-seeking behavior   . IV drug user   . MRSA (methicillin resistant Staphylococcus aureus) infection     Social History   Tobacco Use  . Smoking status: Current Every Day Smoker    Packs/day: 0.50    Years: 7.00    Pack years: 3.50    Types: Cigarettes  . Smokeless tobacco: Never Used  . Tobacco comment: slowing down  Substance Use Topics  . Alcohol use: No  . Drug use: No    Comment:  Started Suboxone 07-13-16    Family History  Problem  Relation Age of Onset  . Alcoholism Father     Allergies  Allergen Reactions  . Vancomycin     Possible contributor to AKI and thrombocytopenia    Objective: Vitals:   11/22/17 1156  BP: 108/77  Pulse: 87  Temp: 98.4 F (36.9 C)  TempSrc: Oral  Weight: 91 lb (41.3 kg)   Body mass index is 16.64 kg/m.  Physical Exam  Constitutional: She is oriented to person, place, and time.  She is lost about 3 pounds.  HENT:  Mouth/Throat: No oropharyngeal exudate.  Eyes: Conjunctivae are normal.  Cardiovascular: Normal rate and regular rhythm.  No murmur heard. Pulmonary/Chest: Effort normal. She has no wheezes. She has no rales.  Abdominal: Soft. She exhibits no distension. There is no tenderness.  Musculoskeletal: Normal range of motion. She exhibits no edema or tenderness.  She has a prominent, stable kyphotic deformity over her mid spine.  Neurological: She is alert and oriented to person, place, and time.  Skin:  Multiple excoriated skin lesions including one inside her right ear.  Psychiatric:  Is angry with me today stating that she "does not know why she comes here".    Lab Results Sed Rate (mm/hr)  Date Value  02/03/2017 51 (H)  11/02/2016 74 (H)  09/23/2016 81 (H)   CRP (mg/L)  Date Value  02/03/2017 1.6  11/02/2016 1.5  09/23/2016 1.5     Problem List Items Addressed This Visit      High   MRSA bacteremia    She is worried that her MRSA infection return because of the skin lesions.  I do not think the skin lesions are due to infection.  I think they are excoriations related to the chronic pruritus related to ongoing narcotic use.  I will however check her inflammatory markers today and blood cultures.      Osteomyelitis of vertebra of thoracolumbar region [facility])    I doubt that her vertebral infection is still active, 6 months after stopping chronic doxycycline therapy.  I believe her kyphotic deformity is due to gradual collapse of the previously infected  bone.  I will check a plain x-ray today.      Relevant Orders   DG THORACOLUMABAR SPINE   C-reactive protein   Sedimentation rate   Culture, blood (single)   Culture, blood (single)     Medium   Intravenous drug abuse, continuous ([facility])    The major underlying problem here is her addiction and relapsed narcotic and injecting drug use.  I certainly support having her enter an inpatient drug treatment program as soon as possible.          Michel Bickers, MD [facility] for Infectious [place] [facility] [phone] pager   [phone] cell 11/22/2017, 12:31 PM

## 2017-11-22 NOTE — Assessment & Plan Note (Signed)
She is worried that her MRSA infection return because of the skin lesions.  I do not think the skin lesions are due to infection.  I think they are excoriations related to the chronic pruritus related to ongoing narcotic use.  I will however check her inflammatory markers today and blood cultures.

## 2017-11-22 NOTE — Assessment & Plan Note (Signed)
I doubt that her vertebral infection is still active, 6 months after stopping chronic doxycycline therapy.  I believe her kyphotic deformity is due to gradual collapse of the previously infected bone.  I will check a plain x-ray today.

## 2017-11-23 LAB — C-REACTIVE PROTEIN: CRP: 2.6 mg/L (ref <8.0)

## 2017-11-23 LAB — SEDIMENTATION RATE: Sed Rate: 63 mm/h — ABNORMAL HIGH (ref 0–20)

## 2017-11-28 LAB — CULTURE, BLOOD (SINGLE)
MICRO NUMBER: 90153831
MICRO NUMBER:: 90153810
RESULT: NO GROWTH
RESULT: NO GROWTH
SPECIMEN QUALITY: ADEQUATE
SPECIMEN QUALITY: ADEQUATE

## 2018-03-03 DIAGNOSIS — F17210 Nicotine dependence, cigarettes, uncomplicated: Secondary | ICD-10-CM

## 2018-03-03 DIAGNOSIS — R042 Hemoptysis: Secondary | ICD-10-CM

## 2018-03-03 DIAGNOSIS — J181 Lobar pneumonia, unspecified organism: Secondary | ICD-10-CM

## 2018-03-03 DIAGNOSIS — F1911 Other psychoactive substance abuse, in remission: Secondary | ICD-10-CM

## 2018-03-03 DIAGNOSIS — R000 Tachycardia, unspecified: Secondary | ICD-10-CM

## 2018-04-07 ENCOUNTER — Emergency Department (HOSPITAL_COMMUNITY): Payer: Self-pay

## 2018-04-07 ENCOUNTER — Encounter (HOSPITAL_COMMUNITY): Payer: Self-pay | Admitting: Emergency Medicine

## 2018-04-07 ENCOUNTER — Other Ambulatory Visit: Payer: Self-pay

## 2018-04-07 ENCOUNTER — Inpatient Hospital Stay (HOSPITAL_COMMUNITY)
Admission: EM | Admit: 2018-04-07 | Discharge: 2018-04-12 | DRG: 871 | Discharge status: Against Medical Advice | Payer: Self-pay | Attending: Internal Medicine | Admitting: Internal Medicine

## 2018-04-07 DIAGNOSIS — N179 Acute kidney failure, unspecified: Secondary | ICD-10-CM | POA: Diagnosis present

## 2018-04-07 DIAGNOSIS — I959 Hypotension, unspecified: Secondary | ICD-10-CM

## 2018-04-07 DIAGNOSIS — M546 Pain in thoracic spine: Secondary | ICD-10-CM

## 2018-04-07 DIAGNOSIS — F1910 Other psychoactive substance abuse, uncomplicated: Secondary | ICD-10-CM

## 2018-04-07 DIAGNOSIS — Z86711 Personal history of pulmonary embolism: Secondary | ICD-10-CM

## 2018-04-07 DIAGNOSIS — A419 Sepsis, unspecified organism: Secondary | ICD-10-CM

## 2018-04-07 DIAGNOSIS — F1920 Other psychoactive substance dependence, uncomplicated: Secondary | ICD-10-CM

## 2018-04-07 DIAGNOSIS — Z4659 Encounter for fitting and adjustment of other gastrointestinal appliance and device: Secondary | ICD-10-CM

## 2018-04-07 DIAGNOSIS — N182 Chronic kidney disease, stage 2 (mild): Secondary | ICD-10-CM | POA: Diagnosis present

## 2018-04-07 DIAGNOSIS — J181 Lobar pneumonia, unspecified organism: Secondary | ICD-10-CM | POA: Diagnosis present

## 2018-04-07 DIAGNOSIS — D61818 Other pancytopenia: Secondary | ICD-10-CM | POA: Diagnosis present

## 2018-04-07 DIAGNOSIS — Z8614 Personal history of Methicillin resistant Staphylococcus aureus infection: Secondary | ICD-10-CM

## 2018-04-07 DIAGNOSIS — F1120 Opioid dependence, uncomplicated: Secondary | ICD-10-CM | POA: Diagnosis present

## 2018-04-07 DIAGNOSIS — R0602 Shortness of breath: Secondary | ICD-10-CM

## 2018-04-07 DIAGNOSIS — I330 Acute and subacute infective endocarditis: Secondary | ICD-10-CM | POA: Diagnosis present

## 2018-04-07 DIAGNOSIS — R233 Spontaneous ecchymoses: Secondary | ICD-10-CM | POA: Diagnosis present

## 2018-04-07 DIAGNOSIS — M4647 Discitis, unspecified, lumbosacral region: Secondary | ICD-10-CM

## 2018-04-07 DIAGNOSIS — A4102 Sepsis due to Methicillin resistant Staphylococcus aureus: Principal | ICD-10-CM | POA: Diagnosis present

## 2018-04-07 DIAGNOSIS — E871 Hypo-osmolality and hyponatremia: Secondary | ICD-10-CM | POA: Diagnosis present

## 2018-04-07 DIAGNOSIS — R21 Rash and other nonspecific skin eruption: Secondary | ICD-10-CM | POA: Diagnosis present

## 2018-04-07 DIAGNOSIS — Z515 Encounter for palliative care: Secondary | ICD-10-CM | POA: Diagnosis present

## 2018-04-07 DIAGNOSIS — I2690 Septic pulmonary embolism without acute cor pulmonale: Secondary | ICD-10-CM | POA: Diagnosis present

## 2018-04-07 DIAGNOSIS — R6520 Severe sepsis without septic shock: Secondary | ICD-10-CM

## 2018-04-07 DIAGNOSIS — M4628 Osteomyelitis of vertebra, sacral and sacrococcygeal region: Secondary | ICD-10-CM | POA: Diagnosis present

## 2018-04-07 DIAGNOSIS — F17210 Nicotine dependence, cigarettes, uncomplicated: Secondary | ICD-10-CM | POA: Diagnosis present

## 2018-04-07 DIAGNOSIS — Z8679 Personal history of other diseases of the circulatory system: Secondary | ICD-10-CM

## 2018-04-07 DIAGNOSIS — G061 Intraspinal abscess and granuloma: Secondary | ICD-10-CM | POA: Diagnosis present

## 2018-04-07 DIAGNOSIS — G8929 Other chronic pain: Secondary | ICD-10-CM

## 2018-04-07 DIAGNOSIS — M4640 Discitis, unspecified, site unspecified: Secondary | ICD-10-CM | POA: Diagnosis present

## 2018-04-07 DIAGNOSIS — R6521 Severe sepsis with septic shock: Secondary | ICD-10-CM | POA: Diagnosis present

## 2018-04-07 DIAGNOSIS — E876 Hypokalemia: Secondary | ICD-10-CM | POA: Diagnosis present

## 2018-04-07 DIAGNOSIS — G062 Extradural and subdural abscess, unspecified: Secondary | ICD-10-CM

## 2018-04-07 DIAGNOSIS — Z881 Allergy status to other antibiotic agents status: Secondary | ICD-10-CM

## 2018-04-07 LAB — COMPREHENSIVE METABOLIC PANEL
ALT: 12 U/L — ABNORMAL LOW (ref 14–54)
ANION GAP: 14 (ref 5–15)
AST: 18 U/L (ref 15–41)
Albumin: 2.2 g/dL — ABNORMAL LOW (ref 3.5–5.0)
Alkaline Phosphatase: 93 U/L (ref 38–126)
BILIRUBIN TOTAL: 0.6 mg/dL (ref 0.3–1.2)
BUN: 85 mg/dL — ABNORMAL HIGH (ref 6–20)
CO2: 23 mmol/L (ref 22–32)
CREATININE: 3.81 mg/dL — AB (ref 0.44–1.00)
Calcium: 7.6 mg/dL — ABNORMAL LOW (ref 8.9–10.3)
Chloride: 93 mmol/L — ABNORMAL LOW (ref 101–111)
GFR, EST AFRICAN AMERICAN: 17 mL/min — AB (ref >60)
GFR, EST NON AFRICAN AMERICAN: 15 mL/min — AB (ref >60)
Glucose, Bld: 121 mg/dL — ABNORMAL HIGH (ref 65–99)
Potassium: 3.4 mmol/L — ABNORMAL LOW (ref 3.5–5.1)
Sodium: 130 mmol/L — ABNORMAL LOW (ref 135–145)
TOTAL PROTEIN: 7.1 g/dL (ref 6.5–8.1)

## 2018-04-07 LAB — URINALYSIS, ROUTINE W REFLEX MICROSCOPIC
Bilirubin Urine: NEGATIVE
Glucose, UA: 50 mg/dL — AB
KETONES UR: NEGATIVE mg/dL
NITRITE: NEGATIVE
PH: 5 (ref 5.0–8.0)
Protein, ur: 100 mg/dL — AB
SPECIFIC GRAVITY, URINE: 1.015 (ref 1.005–1.030)
WBC, UA: >50 WBC/hpf — ABNORMAL HIGH (ref 0–5)

## 2018-04-07 LAB — I-STAT ARTERIAL BLOOD GAS, ED
Acid-base deficit: 3 mmol/L — ABNORMAL HIGH (ref 0.0–2.0)
Bicarbonate: 22.2 mmol/L (ref 20.0–28.0)
O2 SAT: 94 %
PCO2 ART: 36.9 mmHg (ref 32.0–48.0)
PH ART: 7.387 (ref 7.350–7.450)
TCO2: 23 mmol/L (ref 22–32)
pO2, Arterial: 70 mmHg — ABNORMAL LOW (ref 83.0–108.0)

## 2018-04-07 LAB — CBC
HEMATOCRIT: 26.4 % — AB (ref 36.0–46.0)
Hemoglobin: 8.2 g/dL — ABNORMAL LOW (ref 12.0–15.0)
MCH: 24.5 pg — ABNORMAL LOW (ref 26.0–34.0)
MCHC: 31.1 g/dL (ref 30.0–36.0)
MCV: 78.8 fL (ref 78.0–100.0)
PLATELETS: 89 10*3/uL — AB (ref 150–400)
RBC: 3.35 MIL/uL — ABNORMAL LOW (ref 3.87–5.11)
RDW: 15.9 % — ABNORMAL HIGH (ref 11.5–15.5)
WBC: 3.5 10*3/uL — AB (ref 4.0–10.5)

## 2018-04-07 LAB — I-STAT TROPONIN, ED: TROPONIN I, POC: 0 ng/mL (ref 0.00–0.08)

## 2018-04-07 LAB — I-STAT BETA HCG BLOOD, ED (MC, WL, AP ONLY)

## 2018-04-07 LAB — I-STAT CG4 LACTIC ACID, ED: Lactic Acid, Venous: 1.57 mmol/L (ref 0.5–1.9)

## 2018-04-07 LAB — BRAIN NATRIURETIC PEPTIDE: B NATRIURETIC PEPTIDE 5: 75.2 pg/mL (ref 0.0–100.0)

## 2018-04-07 MED ORDER — FENTANYL CITRATE (PF) 100 MCG/2ML IJ SOLN: 25.0000 ug | Freq: Once | INTRAMUSCULAR | Status: DC

## 2018-04-07 MED ORDER — LINEZOLID 600 MG/300ML IV SOLN
600.0000 mg | Freq: Two times a day (BID) | INTRAVENOUS | Status: DC
  Administered 2018-04-07 – 2018-04-08 (×2): 600 mg via INTRAVENOUS
  Filled 2018-04-07 (×2): qty 300

## 2018-04-07 MED ORDER — LACTATED RINGERS IV BOLUS
500.0000 mL | Freq: Once | INTRAVENOUS | Status: AC
  Administered 2018-04-08: 500 mL via INTRAVENOUS

## 2018-04-07 MED ORDER — SODIUM CHLORIDE 0.9 % IV SOLN
2.0000 g | Freq: Once | INTRAVENOUS | Status: AC
  Administered 2018-04-07: 2 g via INTRAVENOUS
  Filled 2018-04-07: qty 2

## 2018-04-07 MED ORDER — VANCOMYCIN HCL 10 G IV SOLR: 25.0000 mg/kg | Freq: Once | INTRAVENOUS | Status: DC

## 2018-04-07 MED ORDER — LACTATED RINGERS IV BOLUS
1000.0000 mL | Freq: Once | INTRAVENOUS | Status: AC
  Administered 2018-04-07: 1000 mL via INTRAVENOUS

## 2018-04-07 NOTE — ED Provider Notes (Signed)
[facility] EMERGENCY DEPARTMENT Provider Note   CSN: [id number] Arrival date & time: 04/07/18  2044  History   Chief Complaint Chief Complaint  Patient presents with  . Leg Swelling  . Chest Pain  . Shortness of Breath    HPI Molly [name] is a 32 y.o. female.  The history is provided by the patient.   32 yo F with PMHx of IVDU, endocarditis who presents with gradually worsening severe bilateral lower extremity edema x 3 days. Accompanied by rash to BLE, fatigue, chills, hematuria, hemoptysis, left sided chest pain, left knee pain. Not relieved by anything. Gait difficulties secondary to edema. Denies dyspnea, abdominal pain.   Past Medical History:  Diagnosis Date  . Abscess of skin    buttock - most recently 2009/10  . Drug-seeking behavior   . IV drug user   . MRSA (methicillin resistant Staphylococcus aureus) infection     Patient Active Problem List   Diagnosis Date Noted  . Severe sepsis ([place]) 04/08/2018  . Hyponatremia 04/08/2018  . Eczema 07/12/2017  . Acute upper respiratory infection 11/02/2016  . Hepatitis C antibody test positive 08/24/2016  . Splenomegaly   . Osteomyelitis of vertebra of thoracolumbar region ([place])   . Polysubstance abuse ([place])   . Cocaine use 11/28/2015  . Pancytopenia ([place]) 11/27/2015  . AKI (acute kidney injury) ([place]) 11/27/2015  . Anasarca 11/27/2015  . Cellulitis of multiple sites of right hand and fingers 07/07/2013  . Anemia 07/04/2013  . Pulmonary HTN ([place]) 07/04/2013  . 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 Septic pulmonary embolism ([place]) 07/03/2013  . Intravenous drug abuse, continuous ([place]) 07/03/2013  . MRSA bacteremia 07/03/2013    Past Surgical History:  Procedure Laterality Date  . IR GENERIC HISTORICAL  07/19/2016   IR LUMBAR DISC ASPIRATION W/IMG GUIDE 07/19/2016 Arne Cleveland, MD [facility]-INTERV RAD  . TEE WITHOUT CARDIOVERSION N/A 07/04/2013   Procedure: TRANSESOPHAGEAL ECHOCARDIOGRAM (TEE);  Surgeon: Larey Dresser, MD;  Location: [facility] ENDOSCOPY;  Service: Cardiovascular;  Laterality: N/A;  . TONSILLECTOMY     32 years old     OB History   None      Home Medications    Prior to Admission medications   Medication Sig Start Date End Date Taking? Authorizing Provider  ibuprofen (ADVIL,MOTRIN) 200 MG tablet Take 400 mg by mouth every 6 (six) hours as needed for moderate pain.    Yes [provider]  senna-docusate (SENOKOT-S) 8.6-50 MG tablet Take 1 tablet by mouth at bedtime as needed for mild constipation. Patient not taking: Reported on 07/12/2017 07/21/16   Florinda Marker, MD    Family History Family History  Problem Relation Age of Onset  . Alcoholism Father     Social History Social History   Tobacco Use  . Smoking status: Current Every Day Smoker    Packs/day: 0.50    Years: 7.00    Pack years: 3.50    Types: Cigarettes  . Smokeless tobacco: Never Used  . Tobacco comment: slowing down  Substance Use Topics  . Alcohol use: No  . Drug use: Yes    Types: IV, Oxycodone    Comment: heroin     Allergies   Vancomycin   Review of Systems Review of Systems  Constitutional: Positive for chills and fatigue. Negative for fever.  HENT: Negative for ear pain and sore throat.   Eyes: Negative for pain and visual disturbance.  Respiratory: Positive  for cough. Negative for shortness of breath.   Cardiovascular: Positive for chest pain and leg swelling. Negative for palpitations.  Gastrointestinal: Negative for abdominal pain, nausea and vomiting.  Genitourinary: Positive for hematuria. Negative for dysuria.  Musculoskeletal: Positive for arthralgias, gait problem and joint swelling. Negative for back pain.  Skin: Positive for rash. Negative for color change.  Neurological: Negative for seizures and syncope.  All other systems reviewed and are negative.    Physical Exam Updated Vital Signs BP (!)  88/70   Pulse (!) 110   Temp 98.4 F (36.9 C)   Resp (!) 27   Ht 5\' 1"  (1.549 m)   Wt 41.3 kg (91 lb)   SpO2 99%   BMI 17.19 kg/m   Physical Exam  Constitutional: She is oriented to person, place, and time. She appears well-developed. No distress.  Cachectic appearance  HENT:  Head: Normocephalic and atraumatic.  Eyes: Conjunctivae and EOM are normal.  Neck: Normal range of motion. Neck supple.  No meningismus  Cardiovascular: Regular rhythm and intact distal pulses. Tachycardia present.  Murmur heard.  Systolic murmur is present with a grade of 4/6. Pulmonary/Chest: Effort normal and breath sounds normal. No stridor. No respiratory distress.  Abdominal: Soft. She exhibits no distension. There is no tenderness.  Musculoskeletal: She exhibits edema (2+ pitting edema to BLE) and tenderness.  Thoracic spine diffuse midline bony tenderness  Neurological: She is alert and oriented to person, place, and time.  Skin: Skin is warm and dry. Purpura and rash noted. Rash is pustular.  Scattered erythematous circular papules to BLE, some with small pustules; petechiae to b/l feet  Psychiatric: She has a normal mood and affect.  Nursing note and vitals reviewed.    ED Treatments / Results  Labs (all labs ordered are listed, but only abnormal results are displayed) Labs Reviewed  CBC - Abnormal; Notable for the following components:      Result Value   WBC 3.5 (*)    RBC 3.35 (*)    Hemoglobin 8.2 (*)    HCT 26.4 (*)    MCH 24.5 (*)    RDW 15.9 (*)    Platelets 89 (*)    All other components within normal limits  COMPREHENSIVE METABOLIC PANEL - Abnormal; Notable for the following components:   Sodium 130 (*)    Potassium 3.4 (*)    Chloride 93 (*)    Glucose, Bld 121 (*)    BUN 85 (*)    Creatinine, Ser 3.81 (*)    Calcium 7.6 (*)    Albumin 2.2 (*)    ALT 12 (*)    GFR calc non Af Amer 15 (*)    GFR calc Af Amer 17 (*)    All other components within normal limits    URINALYSIS, ROUTINE W REFLEX MICROSCOPIC - Abnormal; Notable for the following components:   Color, Urine AMBER (*)    APPearance CLOUDY (*)    Glucose, UA 50 (*)    Hgb urine dipstick LARGE (*)    Protein, ur 100 (*)    Leukocytes, UA MODERATE (*)    WBC, UA >50 (*)    Bacteria, UA RARE (*)    All other components within normal limits  I-STAT ARTERIAL BLOOD GAS, ED - Abnormal; Notable for the following components:   pO2, Arterial 70.0 (*)    Acid-base deficit 3.0 (*)    All other components within normal limits  CULTURE, BLOOD (SINGLE)  CULTURE, BLOOD (ROUTINE X  2)  CULTURE, BLOOD (ROUTINE X 2)  BRAIN NATRIURETIC PEPTIDE  PROCALCITONIN  PROCALCITONIN  RAPID URINE DRUG SCREEN, HOSP PERFORMED  CORTISOL  BLOOD GAS, ARTERIAL  BASIC METABOLIC PANEL  I-STAT TROPONIN, ED  I-STAT BETA HCG BLOOD, ED ([facility], [facility], [facility] ONLY)  I-STAT CG4 LACTIC ACID, ED    EKG EKG Interpretation  Date/Time:  Friday April 07 2018 20:53:13 EDT Ventricular Rate:  120 PR Interval:  138 QRS Duration: 82 QT Interval:  320 QTC Calculation: 452 R Axis:   82 Text Interpretation:  Sinus tachycardia Otherwise normal ECG Since last tracing rate faster Confirmed by Noemi Chapel [phone]) on 04/07/2018 10:59:21 PM   Radiology Dg Chest 2 View  Result Date: 04/07/2018 CLINICAL DATA:  Shortness of breath, bilateral lower extremity swelling. EXAM: CHEST - 2 VIEW COMPARISON:  Radiograph and CT scan of Mar 03, 2018. FINDINGS: The heart size and mediastinal contours are within normal limits. No pneumothorax or pleural effusion is noted. Multiple rounded patchy opacities are seen throughout both lungs concerning for multifocal pneumonia. Right basilar subsegmental atelectasis is noted. The visualized skeletal structures are unremarkable. IMPRESSION: Multiple rounded patchy opacities are noted throughout both lungs most consistent with multifocal pneumonia. Mild right basilar subsegmental atelectasis is noted. Electronically  Signed   By: Marijo Conception, M.D.   On: 04/07/2018 21:45    Procedures Procedures (including critical care time)  Medications Ordered in ED Medications  linezolid (ZYVOX) IVPB 600 mg (has no administration in time range)  lactated ringers bolus 500 mL (has no administration in time range)  nicotine (NICODERM CQ - dosed in mg/24 hours) patch 14 mg (has no administration in time range)  lactated ringers bolus 1,000 mL (0 mLs Intravenous Stopped 04/07/18 2253)  ceFEPIme (MAXIPIME) 2 g in sodium chloride 0.9 % 100 mL IVPB (0 g Intravenous Stopped 04/08/18 0022)  lactated ringers bolus 1,000 mL (1,000 mLs Intravenous New Bag/Given 04/07/18 2300)     Initial Impression / Assessment and Plan / ED Course  I have reviewed the triage vital signs and the nursing notes.  Pertinent labs & imaging results that were available during my care of the patient were reviewed by me and considered in my medical decision making (see chart for details).     Molly [name] is a 32 y.o. female with PMHx of endocarditis, IVDU who p/w worsening LE edema and lesions x 3 days. Reviewed and confirmed nursing documentation for past medical history, family history, social history. VS afebrile, HR 140s, BP 80s/50s. Exam remarkable for pitting edema to BLE, lesions c/w vascular phenomenon, 4/6 SEM to left sternal border. Clinical picture concerning for endocarditis with septic shock. Given variety of complaints including hemoptysis, hematuria, midline back pain concerning for septic emboli.   Code sepsis initiated. BP improved to 101 after 30cc/kg LR. BNP wnl. Lactic acid wnl. CBC with chronic leukopenia with WBC 3.5, worsened normocytic anemia with hgb 8.2, stable thrombocytopenia at 89. CMP with AKI, Cr 3.81, BUN 85. UA with moderate leuks, >50 WBC, concerning for UTI. Urine culture pending. Blood cultures x 3 pending. CXR c/w multifocal pneumonia. IV linezolid and cefepime given.   Old records reviewed. Labs reviewed by  me and used in the medical decision making.  Imaging viewed and interpreted by me and used in the medical decision making (formal interpretation from radiologist). EKG reviewed by me and used in the medical decision making. Admitted to hospitalist.    Final Clinical Impressions(s) / ED Diagnoses   Final diagnoses:  Septic  shock ([place])  Acute bacterial endocarditis  Acute renal failure, unspecified acute renal failure type ([place])      Norm Salt, MD 04/08/18 0040    Noemi Chapel, MD 04/08/18 [phone]

## 2018-04-07 NOTE — ED Triage Notes (Addendum)
C/o bilateral leg swelling x 3 days with sob and red sores on legs.  Left sided chest pressure that started today.  Hx of endocarditis. Last used heroin last night.  Reports fever x 2 days.

## 2018-04-07 NOTE — ED Provider Notes (Signed)
The patient is a very ill 32 year old female with a known history of IV drug use.  She used to shoot Opana, she then switched over to heroin more recently, she has a history of multiple episodes of endocarditis as well as osteomyelitis of the spine in the past.  She is followed by infectious disease.  She presents today with fever weakness increasing coughing, hemoptysis, redness and sores all over her legs and some chest pressure.  She states I am short of breath because I am in so much pain.  On exam the patient in fact has a loud heart murmur, she is tachycardic, she is tachypneic, she has bilateral lower extremity edema with legs that are covered in petechiae and purpura.   She has what appears to be tenderness over her mid thoracic spine as well as some abnormal appearance of the spine.  The patient has a lactic acid of 1.5, she is leukopenic, the urinalysis has large urine, large white blood cells and rare bacteria.  Her chest x-ray has multiple infiltrates consistent with what looks like septic emboli.  I am concerned that the patient is in septic shock.  She is getting 30 cc/kg of IV fluids, antibiotics to cover for broad-spectrum infection, she will likely need to be admitted to the intensive care unit given her severe hypotension and ill appearance.  .Critical Care Performed by: Noemi Chapel, MD Authorized by: Noemi Chapel, MD   Critical care provider statement:    Critical care time (minutes):  35   Critical care time was exclusive of:  Separately billable procedures and treating other patients and teaching time   Critical care was necessary to treat or prevent imminent or life-threatening deterioration of the following conditions:  Sepsis and shock   Critical care was time spent personally by me on the following activities:  Blood draw for specimens, development of treatment plan with patient or surrogate, discussions with consultants, evaluation of patient's response to treatment,  examination of patient, obtaining history from patient or surrogate, ordering and performing treatments and interventions, ordering and review of laboratory studies, ordering and review of radiographic studies, pulse oximetry, re-evaluation of patient's condition and review of old charts    EKG Interpretation  Date/Time:  Friday April 07 2018 20:53:13 EDT Ventricular Rate:  120 PR Interval:  138 QRS Duration: 82 QT Interval:  320 QTC Calculation: 452 R Axis:   82 Text Interpretation:  Sinus tachycardia Otherwise normal ECG Since last tracing rate faster Confirmed by Noemi Chapel [phone]) on 04/07/2018 10:59:21 PM       I discussed care with the intensive care unit physician on-call who will come see the patient for ICU admission.    ICU team states they will consult and request medical admission to hospitalist service  I saw and evaluated the patient, reviewed the resident's note and I agree with the findings and plan.  Final diagnoses:  Septic shock ([place])  Acute bacterial endocarditis  Acute renal failure, unspecified acute renal failure type ([facility])         Noemi Chapel, MD 04/08/18 1623

## 2018-04-07 NOTE — Consult Note (Signed)
Name: Molly [name] MRN: [id number] DOB: 1986-03-31    ADMISSION DATE:  04/07/2018 CONSULTATION DATE:  04/08/2018  REFERRING MD :  Dr. Berline Lopes   CHIEF COMPLAINT:  Hypotension   HISTORY OF PRESENT ILLNESS:   32 year old female with PMH of IV drug use (Heronin/Cocaine/IV Opana), Aortic Valve Endocarditis, Osteomyelitis of Thoracolumbar Region with MRSA, Cellulitis of Right Hand/Fingers  Followed By Dr. Megan Salon of ID, Last Seen 2/05. Believed Skin lesions due to Chronic Pruritus secondary to narcotic use   Presents to ED on 6/21 with reported 3 days of dyspnea, Red sores on her legs with swelling, and fevers. States that she used heroin last 6/21. In ED LA 1.5, WBC 3.5, U/A with large WBC and Rare Bacteria, Crt 3.81, BUN 85. CXR with multiple rounded patchy opacities noted throughout both lungs most consistent with multifocal pneumonia. BP 99/63. PCCM asked to consult.   SIGNIFICANT EVENTS  6/21 > Presents to ED   STUDIES:  CXR 6/21 > Multiple rounded patchy opacities are noted throughout both lungs most consistent with multifocal pneumonia. Mild right basilar subsegmental atelectasis is noted  PAST MEDICAL HISTORY :   has a past medical history of Abscess of skin, Drug-seeking behavior, IV drug user, and MRSA (methicillin resistant Staphylococcus aureus) infection.  has a past surgical history that includes TEE without cardioversion (N/A, 07/04/2013); Tonsillectomy; and ir generic historical (07/19/2016). Prior to Admission medications   Medication Sig Start Date End Date Taking? Authorizing Provider  ibuprofen (ADVIL,MOTRIN) 200 MG tablet Take 400 mg by mouth every 6 (six) hours as needed for moderate pain.    Yes [provider]  senna-docusate (SENOKOT-S) 8.6-50 MG tablet Take 1 tablet by mouth at bedtime as needed for mild constipation. Patient not taking: Reported on 07/12/2017 07/21/16   Florinda Marker, MD   Allergies  Allergen Reactions  . Vancomycin Other (See  Comments)    Possible contributor to AKI and thrombocytopenia    FAMILY HISTORY:  family history includes Alcoholism in her father. SOCIAL HISTORY:  reports that she has been smoking cigarettes.  She has a 3.50 pack-year smoking history. She has never used smokeless tobacco. She reports that she has current or past drug history. Drugs: IV and Oxycodone. She reports that she does not drink alcohol.  REVIEW OF SYSTEMS:   All negative; except for those that are bolded, which indicate positives.  Constitutional: weight loss, weight gain, night sweats, fevers, chills, fatigue, weakness.  HEENT: headaches, sore throat, sneezing, nasal congestion, post nasal drip, difficulty swallowing, tooth/dental problems, visual complaints, visual changes, ear aches. Neuro: difficulty with speech, weakness, numbness, ataxia. CV:  chest pain, orthopnea, PND, swelling in lower extremities, dizziness, palpitations, syncope.  Resp: cough, hemoptysis, dyspnea, wheezing. GI: heartburn, indigestion, abdominal pain, nausea, vomiting, diarrhea, constipation, change in bowel habits, loss of appetite, hematemesis, melena, hematochezia.  GU: dysuria, change in color of urine, urgency or frequency, flank pain, hematuria. MSK: joint pain or swelling, decreased range of motion. Psych: change in mood or affect, depression, anxiety, suicidal ideations, homicidal ideations. Skin: rash, itching, bruising.   SUBJECTIVE:  Sitting in bed texting on phone asking for pain medication   VITAL SIGNS: Temp:  [98.4 F (36.9 C)] 98.4 F (36.9 C) (06/21 2056) Pulse Rate:  [107-141] 110 (06/21 2345) Resp:  [14-20] 14 (06/21 2345) BP: (77-101)/(47-67) 90/60 (06/21 2345) SpO2:  [95 %-100 %] 95 % (06/21 2345) Weight:  [41.3 kg (91 lb)] 41.3 kg (91 lb) (06/21 2237)  PHYSICAL EXAMINATION: General:  Adult female, no distress  Neuro:  Alert and oriented, follows commands  HEENT:  Dry MM  Cardiovascular:  Tachy, Systolic Murmur    Lungs:  Non-labored, clear breath sounds  Abdomen:  Non-tender, active bowel sounds  Musculoskeletal:  +2 BLE  Skin:  Warm, petechiae to BLE   Recent Labs  Lab 04/07/18 2108  NA 130*  K 3.4*  CL 93*  CO2 23  BUN 85*  CREATININE 3.81*  GLUCOSE 121*   Recent Labs  Lab 04/07/18 2108  HGB 8.2*  HCT 26.4*  WBC 3.5*  PLT 89*   Dg Chest 2 View  Result Date: 04/07/2018 CLINICAL DATA:  Shortness of breath, bilateral lower extremity swelling. EXAM: CHEST - 2 VIEW COMPARISON:  Radiograph and CT scan of Mar 03, 2018. FINDINGS: The heart size and mediastinal contours are within normal limits. No pneumothorax or pleural effusion is noted. Multiple rounded patchy opacities are seen throughout both lungs concerning for multifocal pneumonia. Right basilar subsegmental atelectasis is noted. The visualized skeletal structures are unremarkable. IMPRESSION: Multiple rounded patchy opacities are noted throughout both lungs most consistent with multifocal pneumonia. Mild right basilar subsegmental atelectasis is noted. Electronically Signed   By: Marijo Conception, M.D.   On: 04/07/2018 21:45    ASSESSMENT / PLAN:  Hypotension in setting of hypovolemia vs sepsis -Patient states BP typically 38-182 systolic -Troponin 9.93   -Chest pain reproducible with touch  H/O Aortic Valve Endocarditis (Treated 07/2016)  Plan  -Cardiac Monitoring -Maintain MAP >71 (systolic currently 69-678 with MAP 65-80 and appropriate mentation)  -ECHO ordered  -Cortisol pending   Rounded Patchy opacities noted throughout both lungs - concern for septic emboli -Also with petechiae to BLE Plan  -ECHO pending to assess for vegetation    Acute Kidney Injury  -LA 1.57  Plan  -Trend BMP -Currently Receiving Fluids, 2.5 L ordered, has received 1 thus far  Chronic Leukopenia  Reports Fevers, in ED 98.4 Plan -Trend WBC and Fever Curve -Follow Culture Data -Trend PCT  -Received Cefepime and Linezolid in ED   H/O  MRSA Osteomyelitis of Vertebra of Thoracolumbar Region  -Followed by ID (Dr. Megan Salon)  -Consult ID in AM   Polysubstance Abuse (Heronin/Cocaine/IV Opana)  -On going use, recently switched to Heroin  Chronic Pain  Plan  -Substance Abuse Education   At this time patient is appropriate for Step-Down Admission with [facility].   Hayden Pedro, AGACNP-BC [facility]  Pgr: [phone]  PCCM Pgr: [phone]

## 2018-04-08 ENCOUNTER — Inpatient Hospital Stay (HOSPITAL_COMMUNITY): Payer: Self-pay

## 2018-04-08 ENCOUNTER — Encounter (HOSPITAL_COMMUNITY): Payer: Self-pay | Admitting: Family Medicine

## 2018-04-08 DIAGNOSIS — E871 Hypo-osmolality and hyponatremia: Secondary | ICD-10-CM

## 2018-04-08 DIAGNOSIS — I079 Rheumatic tricuspid valve disease, unspecified: Secondary | ICD-10-CM

## 2018-04-08 DIAGNOSIS — R6520 Severe sepsis without septic shock: Secondary | ICD-10-CM

## 2018-04-08 DIAGNOSIS — A419 Sepsis, unspecified organism: Secondary | ICD-10-CM

## 2018-04-08 DIAGNOSIS — R7881 Bacteremia: Secondary | ICD-10-CM

## 2018-04-08 DIAGNOSIS — D61818 Other pancytopenia: Secondary | ICD-10-CM

## 2018-04-08 DIAGNOSIS — Z881 Allergy status to other antibiotic agents status: Secondary | ICD-10-CM

## 2018-04-08 DIAGNOSIS — M549 Dorsalgia, unspecified: Secondary | ICD-10-CM

## 2018-04-08 DIAGNOSIS — B9562 Methicillin resistant Staphylococcus aureus infection as the cause of diseases classified elsewhere: Secondary | ICD-10-CM

## 2018-04-08 DIAGNOSIS — F17210 Nicotine dependence, cigarettes, uncomplicated: Secondary | ICD-10-CM

## 2018-04-08 DIAGNOSIS — Z8614 Personal history of Methicillin resistant Staphylococcus aureus infection: Secondary | ICD-10-CM

## 2018-04-08 HISTORY — DX: Sepsis, unspecified organism: A41.9

## 2018-04-08 HISTORY — DX: Sepsis, unspecified organism: R65.20

## 2018-04-08 LAB — CBC WITH DIFFERENTIAL/PLATELET
BASOS ABS: 0 10*3/uL (ref 0.0–0.1)
Basophils Relative: 1 %
EOS ABS: 0 10*3/uL (ref 0.0–0.7)
Eosinophils Relative: 0 %
HEMATOCRIT: 23 % — AB (ref 36.0–46.0)
Hemoglobin: 7.1 g/dL — ABNORMAL LOW (ref 12.0–15.0)
LYMPHS ABS: 0.3 10*3/uL — AB (ref 0.7–4.0)
Lymphocytes Relative: 12 %
MCH: 24.3 pg — ABNORMAL LOW (ref 26.0–34.0)
MCHC: 30.9 g/dL (ref 30.0–36.0)
MCV: 78.8 fL (ref 78.0–100.0)
Monocytes Absolute: 0.1 10*3/uL (ref 0.1–1.0)
Monocytes Relative: 5 %
NEUTROS ABS: 2.4 10*3/uL (ref 1.7–7.7)
Neutrophils Relative %: 82 %
Platelets: 81 10*3/uL — ABNORMAL LOW (ref 150–400)
RBC: 2.92 MIL/uL — ABNORMAL LOW (ref 3.87–5.11)
RDW: 15.9 % — AB (ref 11.5–15.5)
WBC: 2.8 10*3/uL — ABNORMAL LOW (ref 4.0–10.5)

## 2018-04-08 LAB — BASIC METABOLIC PANEL
ANION GAP: 12 (ref 5–15)
Anion gap: 9 (ref 5–15)
BUN: 82 mg/dL — AB (ref 6–20)
BUN: 85 mg/dL — ABNORMAL HIGH (ref 6–20)
CALCIUM: 7.2 mg/dL — AB (ref 8.9–10.3)
CALCIUM: 7.5 mg/dL — AB (ref 8.9–10.3)
CHLORIDE: 96 mmol/L — AB (ref 101–111)
CO2: 22 mmol/L (ref 22–32)
CO2: 23 mmol/L (ref 22–32)
CREATININE: 3.22 mg/dL — AB (ref 0.44–1.00)
CREATININE: 3.65 mg/dL — AB (ref 0.44–1.00)
Chloride: 100 mmol/L — ABNORMAL LOW (ref 101–111)
GFR calc Af Amer: 21 mL/min — ABNORMAL LOW (ref >60)
GFR calc non Af Amer: 16 mL/min — ABNORMAL LOW (ref >60)
GFR, EST AFRICAN AMERICAN: 18 mL/min — AB (ref >60)
GFR, EST NON AFRICAN AMERICAN: 18 mL/min — AB (ref >60)
GLUCOSE: 117 mg/dL — AB (ref 65–99)
Glucose, Bld: 111 mg/dL — ABNORMAL HIGH (ref 65–99)
Potassium: 3.5 mmol/L (ref 3.5–5.1)
Potassium: 3.9 mmol/L (ref 3.5–5.1)
Sodium: 130 mmol/L — ABNORMAL LOW (ref 135–145)
Sodium: 132 mmol/L — ABNORMAL LOW (ref 135–145)

## 2018-04-08 LAB — MRSA PCR SCREENING: MRSA by PCR: POSITIVE — AB

## 2018-04-08 LAB — MAGNESIUM: MAGNESIUM: 1.7 mg/dL (ref 1.7–2.4)

## 2018-04-08 LAB — ECHOCARDIOGRAM COMPLETE
HEIGHTINCHES: 61 in
Weight: 1569.6 oz

## 2018-04-08 LAB — BLOOD CULTURE ID PANEL (REFLEXED)
ACINETOBACTER BAUMANNII: NOT DETECTED
CANDIDA KRUSEI: NOT DETECTED
CANDIDA PARAPSILOSIS: NOT DETECTED
CANDIDA TROPICALIS: NOT DETECTED
Candida albicans: NOT DETECTED
Candida glabrata: NOT DETECTED
ESCHERICHIA COLI: NOT DETECTED
Enterobacter cloacae complex: NOT DETECTED
Enterobacteriaceae species: NOT DETECTED
Enterococcus species: NOT DETECTED
HAEMOPHILUS INFLUENZAE: NOT DETECTED
KLEBSIELLA OXYTOCA: NOT DETECTED
KLEBSIELLA PNEUMONIAE: NOT DETECTED
Listeria monocytogenes: NOT DETECTED
METHICILLIN RESISTANCE: DETECTED — AB
Neisseria meningitidis: NOT DETECTED
PROTEUS SPECIES: NOT DETECTED
Pseudomonas aeruginosa: NOT DETECTED
SERRATIA MARCESCENS: NOT DETECTED
STAPHYLOCOCCUS AUREUS BCID: DETECTED — AB
STAPHYLOCOCCUS SPECIES: DETECTED — AB
STREPTOCOCCUS PNEUMONIAE: NOT DETECTED
Streptococcus agalactiae: NOT DETECTED
Streptococcus pyogenes: NOT DETECTED
Streptococcus species: NOT DETECTED

## 2018-04-08 LAB — CORTISOL: Cortisol, Plasma: 21.2 ug/dL

## 2018-04-08 LAB — HIV ANTIBODY (ROUTINE TESTING W REFLEX): HIV Screen 4th Generation wRfx: NONREACTIVE

## 2018-04-08 LAB — LACTIC ACID, PLASMA: LACTIC ACID, VENOUS: 0.6 mmol/L (ref 0.5–1.9)

## 2018-04-08 LAB — PROCALCITONIN
PROCALCITONIN: 7.97 ng/mL
Procalcitonin: 7 ng/mL

## 2018-04-08 LAB — PROTIME-INR
INR: 1.47
PROTHROMBIN TIME: 17.7 s — AB (ref 11.4–15.2)

## 2018-04-08 LAB — PREPARE RBC (CROSSMATCH)

## 2018-04-08 MED ORDER — SODIUM CHLORIDE 0.9% FLUSH
3.0000 mL | Freq: Two times a day (BID) | INTRAVENOUS | Status: DC
  Administered 2018-04-08 – 2018-04-12 (×6): 3 mL via INTRAVENOUS

## 2018-04-08 MED ORDER — POTASSIUM CHLORIDE CRYS ER 20 MEQ PO TBCR
40.0000 meq | EXTENDED_RELEASE_TABLET | Freq: Every day | ORAL | Status: DC
  Administered 2018-04-08 – 2018-04-10 (×3): 40 meq via ORAL
  Filled 2018-04-08 (×5): qty 2

## 2018-04-08 MED ORDER — SENNOSIDES-DOCUSATE SODIUM 8.6-50 MG PO TABS: 1.0000 | ORAL_TABLET | Freq: Every evening | ORAL | Status: DC | PRN

## 2018-04-08 MED ORDER — SODIUM CHLORIDE 0.9 % IV SOLN
350.0000 mg | INTRAVENOUS | Status: DC
  Administered 2018-04-08: 350 mg via INTRAVENOUS
  Filled 2018-04-08 (×2): qty 7

## 2018-04-08 MED ORDER — ACETAMINOPHEN 325 MG PO TABS
650.0000 mg | ORAL_TABLET | Freq: Four times a day (QID) | ORAL | Status: DC | PRN
  Administered 2018-04-09: 650 mg via ORAL
  Filled 2018-04-08: qty 2

## 2018-04-08 MED ORDER — ONDANSETRON HCL 4 MG/2ML IJ SOLN
4.0000 mg | Freq: Four times a day (QID) | INTRAMUSCULAR | Status: DC | PRN
  Administered 2018-04-08 – 2018-04-11 (×3): 4 mg via INTRAVENOUS
  Filled 2018-04-08 (×4): qty 2

## 2018-04-08 MED ORDER — OXYCODONE HCL 5 MG PO TABS
5.0000 mg | ORAL_TABLET | ORAL | Status: DC | PRN
  Administered 2018-04-08 – 2018-04-09 (×3): 5 mg via ORAL
  Filled 2018-04-08 (×4): qty 1

## 2018-04-08 MED ORDER — DOXYCYCLINE HYCLATE 100 MG PO TABS
100.0000 mg | ORAL_TABLET | Freq: Two times a day (BID) | ORAL | Status: DC
  Administered 2018-04-08 – 2018-04-10 (×5): 100 mg via ORAL
  Filled 2018-04-08 (×5): qty 1

## 2018-04-08 MED ORDER — POTASSIUM CHLORIDE IN NACL 20-0.9 MEQ/L-% IV SOLN
INTRAVENOUS | Status: AC
  Administered 2018-04-08: 04:00:00 via INTRAVENOUS
  Filled 2018-04-08: qty 1000

## 2018-04-08 MED ORDER — ALUM & MAG HYDROXIDE-SIMETH 200-200-20 MG/5ML PO SUSP: 30.0000 mL | ORAL | Status: DC | PRN

## 2018-04-08 MED ORDER — NICOTINE 14 MG/24HR TD PT24
14.0000 mg | MEDICATED_PATCH | Freq: Every day | TRANSDERMAL | Status: DC
  Administered 2018-04-08 – 2018-04-12 (×6): 14 mg via TRANSDERMAL
  Filled 2018-04-08 (×6): qty 1

## 2018-04-08 MED ORDER — SODIUM CHLORIDE 0.9 % IV SOLN: 1.0000 g | INTRAVENOUS | Status: DC

## 2018-04-08 MED ORDER — ONDANSETRON HCL 4 MG PO TABS: 4.0000 mg | ORAL_TABLET | Freq: Four times a day (QID) | ORAL | Status: DC | PRN

## 2018-04-08 MED ORDER — HYDROMORPHONE HCL 2 MG PO TABS: 4.0000 mg | ORAL_TABLET | Freq: Four times a day (QID) | ORAL | Status: DC | PRN

## 2018-04-08 MED ORDER — MUPIROCIN 2 % EX OINT
1.0000 "application " | TOPICAL_OINTMENT | Freq: Two times a day (BID) | CUTANEOUS | Status: DC
  Administered 2018-04-08 – 2018-04-12 (×8): 1 via NASAL
  Filled 2018-04-08 (×2): qty 22

## 2018-04-08 MED ORDER — OXYCODONE HCL 5 MG PO TABS
5.0000 mg | ORAL_TABLET | ORAL | Status: DC | PRN
  Administered 2018-04-08: 5 mg via ORAL
  Filled 2018-04-08: qty 1

## 2018-04-08 MED ORDER — ACETAMINOPHEN 650 MG RE SUPP: 650.0000 mg | Freq: Four times a day (QID) | RECTAL | Status: DC | PRN

## 2018-04-08 MED ORDER — BISACODYL 5 MG PO TBEC: 5.0000 mg | DELAYED_RELEASE_TABLET | Freq: Every day | ORAL | Status: DC | PRN

## 2018-04-08 MED ORDER — SODIUM CHLORIDE 0.9% IV SOLUTION
Freq: Once | INTRAVENOUS | Status: AC
  Administered 2018-04-08: 13:00:00 via INTRAVENOUS

## 2018-04-08 MED ORDER — HYDROMORPHONE HCL 1 MG/ML IJ SOLN
1.0000 mg | INTRAMUSCULAR | Status: DC | PRN
  Administered 2018-04-09 – 2018-04-10 (×6): 1 mg via INTRAVENOUS
  Filled 2018-04-08 (×6): qty 1

## 2018-04-08 MED ORDER — CHLORHEXIDINE GLUCONATE CLOTH 2 % EX PADS
6.0000 | MEDICATED_PAD | Freq: Every day | CUTANEOUS | Status: DC
  Administered 2018-04-09 – 2018-04-12 (×3): 6 via TOPICAL

## 2018-04-08 NOTE — Progress Notes (Signed)
Pt remains stable but condition guarded. Not on pressors . B/p marginal . For 2 u PRBC  Chart reviewed, PCCM available if needed.

## 2018-04-08 NOTE — Progress Notes (Signed)
  Echocardiogram 2D Echocardiogram has been performed.  Johny Chess 04/08/2018, 11:03 AM

## 2018-04-08 NOTE — Consult Note (Signed)
[facility]       Reason for Consult: TV endocarditis, MRSA bacteremia    Referring Physician: Dr. Loleta Books  Principal Problem:   Severe sepsis [facility]) Active Problems:   Septic pulmonary embolism ([place])   Intravenous drug abuse, continuous ([place])   Pancytopenia ([place])   AKI (acute kidney injury) ([place])   Hyponatremia   . Chlorhexidine Gluconate Cloth  6 each Topical Q0600  . doxycycline  100 mg Oral Q12H  . mupirocin ointment  1 application Nasal BID  . nicotine  14 mg Transdermal Daily  . potassium chloride  40 mEq Oral Daily  . sodium chloride flush  3 mL Intravenous Q12H    Recommendations: daptomycin Doxycycline for possible septic emboli/pulmonary involvement Cardiology consultation for endocarditis Monday MRI thoracic and lumbar - I have placed this  Assessment: She has MRSA bacteremia with TV endocarditis, back pain concerning for discitis again and CXR with possible emboli.    Hepatitis C RNA pending, HIV pending, hepatitis B surface Ag pending  Antibiotics: linezolid and cefepime  HPI: Molly [name] is a 32 y.o. female with history of IVDU and aortic valve endocarditis, thoracic discitis with MRSA who relapsed with IVDU and now here with fever and found to have MRSA bacteremia, TV endocarditis, new back pain.  She developed thrombocytopenia to vancomycin previously.  She was supposed to continue doxycycline suppression but stopped last August.  She was seen by Dr. Megan Salon in February with concern for infection but no new infections noted at the time.  She complains of same kyphosis and xray done in February noted destruction.    Review of Systems:  Constitutional: negative for fevers, chills and fatigue Gastrointestinal: negative for nausea and diarrhea Integument/breast: negative for rash All other systems reviewed and are negative    Past Medical History:  Diagnosis Date  . Abscess of skin    buttock - most recently 2009/10  .  Drug-seeking behavior   . IV drug user   . MRSA (methicillin resistant Staphylococcus aureus) infection     Social History   Tobacco Use  . Smoking status: Current Every Day Smoker    Packs/day: 0.50    Years: 7.00    Pack years: 3.50    Types: Cigarettes  . Smokeless tobacco: Never Used  . Tobacco comment: slowing down  Substance Use Topics  . Alcohol use: No  . Drug use: Yes    Types: IV, Oxycodone    Comment: heroin    Family History  Problem Relation Age of Onset  . Alcoholism Father     Allergies  Allergen Reactions  . Vancomycin Other (See Comments)    Possible contributor to AKI and thrombocytopenia    Physical Exam: Constitutional: in no apparent distress; chronically ill-appearing, thin Vitals:   04/08/18 1315 04/08/18 1316  BP: (!) 88/76   Pulse: 92 90  Resp: 17 15  Temp: 97.7 F (36.5 C)   SpO2: 97% 97%   EYES: anicteric ENMT: no thrush Cardiovascular: Cor RRR Respiratory: CTA B; normal respiratory effort GI: Bowel sounds are normal, liver is not enlarged, spleen is not enlarged Musculoskeletal: no pedal edema noted Skin: negatives: no rash Hematologic: no cervical lad  Lab Results  Component Value Date   WBC 2.8 (L) 04/08/2018   HGB 7.1 (L) 04/08/2018   HCT 23.0 (L) 04/08/2018   MCV 78.8 04/08/2018   PLT 81 (L) 04/08/2018    Lab Results  Component Value Date   CREATININE 3.22 (H)  04/08/2018   BUN 82 (H) 04/08/2018   NA 132 (L) 04/08/2018   K 3.9 04/08/2018   CL 100 (L) 04/08/2018   CO2 23 04/08/2018    Lab Results  Component Value Date   ALT 12 (L) 04/07/2018   AST 18 04/07/2018   ALKPHOS 93 04/07/2018     Microbiology: Recent Results (from the past 240 hour(s))  Blood culture (routine x 2)     Status: None (Preliminary result)   Collection Time: 04/07/18 10:22 PM  Result Value Ref Range Status   Specimen Description BLOOD LEFT FOREARM  Final   Special Requests   Final    BOTTLES DRAWN AEROBIC AND ANAEROBIC Blood Culture  results may not be optimal due to an inadequate volume of blood received in culture bottles   Culture  Setup Time   Final    GRAM POSITIVE COCCI IN BOTH AEROBIC AND ANAEROBIC BOTTLES CRITICAL RESULT CALLED TO, READ BACK BY AND VERIFIED WITH: PHARMD E [place] 04/08/18 AR 1316 BY CM Performed at [facility], [place] [address] [place], [place] [address]    Culture GRAM POSITIVE COCCI  Final   Report Status PENDING  Incomplete  Blood Culture ID Panel (Reflexed)     Status: Abnormal   Collection Time: 04/07/18 10:22 PM  Result Value Ref Range Status   Enterococcus species NOT DETECTED NOT DETECTED Final   Listeria monocytogenes NOT DETECTED NOT DETECTED Final   Staphylococcus species DETECTED (A) NOT DETECTED Final    Comment: CRITICAL RESULT CALLED TO, READ BACK BY AND VERIFIED WITH: PHARMD E SINCLEAR 04/08/18 AT 1319 BU CM    Staphylococcus aureus DETECTED (A) NOT DETECTED Final    Comment: Methicillin (oxacillin)-resistant Staphylococcus aureus (MRSA). MRSA is predictably resistant to beta-lactam antibiotics (except ceftaroline). Preferred therapy is vancomycin unless clinically contraindicated. Patient requires contact precautions if  hospitalized. CRITICAL RESULT CALLED TO, READ BACK BY AND VERIFIED WITH: PHARMD E SINCLEAR 04/08/18 AT 1319 BY CM    Methicillin resistance DETECTED (A) NOT DETECTED Final    Comment: CRITICAL RESULT CALLED TO, READ BACK BY AND VERIFIED WITH: PHARMD E SINCLEAR 04/08/18 AT 1319 BY CM    Streptococcus species NOT DETECTED NOT DETECTED Final   Streptococcus agalactiae NOT DETECTED NOT DETECTED Final   Streptococcus pneumoniae NOT DETECTED NOT DETECTED Final   Streptococcus pyogenes NOT DETECTED NOT DETECTED Final   Acinetobacter baumannii NOT DETECTED NOT DETECTED Final   Enterobacteriaceae species NOT DETECTED NOT DETECTED Final   Enterobacter cloacae complex NOT DETECTED NOT DETECTED Final   Escherichia coli NOT DETECTED NOT DETECTED Final    Klebsiella oxytoca NOT DETECTED NOT DETECTED Final   Klebsiella pneumoniae NOT DETECTED NOT DETECTED Final   Proteus species NOT DETECTED NOT DETECTED Final   Serratia marcescens NOT DETECTED NOT DETECTED Final   Haemophilus influenzae NOT DETECTED NOT DETECTED Final   Neisseria meningitidis NOT DETECTED NOT DETECTED Final   Pseudomonas aeruginosa NOT DETECTED NOT DETECTED Final   Candida albicans NOT DETECTED NOT DETECTED Final   Candida glabrata NOT DETECTED NOT DETECTED Final   Candida krusei NOT DETECTED NOT DETECTED Final   Candida parapsilosis NOT DETECTED NOT DETECTED Final   Candida tropicalis NOT DETECTED NOT DETECTED Final    Comment: Performed at [facility], [place]. [address] [place], [place] [address]  Blood culture (routine x 2)     Status: None (Preliminary result)   Collection Time: 04/07/18 10:35 PM  Result Value Ref Range Status   Specimen  Description BLOOD RIGHT ANTECUBITAL  Final   Special Requests   Final    BOTTLES DRAWN AEROBIC AND ANAEROBIC Blood Culture results may not be optimal due to an inadequate volume of blood received in culture bottles   Culture  Setup Time   Final    GRAM POSITIVE COCCI IN BOTH AEROBIC AND ANAEROBIC BOTTLES Performed at [facility], [place] [address] [place], [place] [address]    Culture GRAM POSITIVE COCCI  Final   Report Status PENDING  Incomplete  MRSA PCR Screening     Status: Abnormal   Collection Time: 04/08/18  2:27 AM  Result Value Ref Range Status   MRSA by PCR POSITIVE (A) NEGATIVE Final    Comment:        The GeneXpert MRSA Assay (FDA approved for NASAL specimens only), is one component of a comprehensive MRSA colonization surveillance program. It is not intended to diagnose MRSA infection nor to guide or monitor treatment for MRSA infections. RESULT CALLED TO, READ BACK BY AND VERIFIED WITHParks Ranger RN 04/08/18 0801 JDW     Thayer Headings, MD [facility] [email or web address] [phone] pager  [phone] cell 04/08/2018, 1:56 PM

## 2018-04-08 NOTE — Consult Note (Addendum)
[facility]  HISTORY AND PHYSICAL  Molly [name] is an 32 y.o. female.    Chief Complaint: LE edema, SOB, rash  HPI: Pt is a 45F with a PMH of IVDU, AV endocarditis, osteomyelitis, and h/o MRSA bacteremia who is now seen in consultation at the request of Dr. Loleta Books for eval and recs re: AKI.    Pt presented yesterday to the ED for the above-mentioned symptoms which had been occurring for the past 2-3 days.  On arrival to ED, she was found to be afebrile, tachy to the 140s, BP 70s/40s, and have multifocal opacities on CXR concerning for septic emboli.  She was given aggressive IVF, cultures drawn, and was placed on linezolid/ cefepime.  PCCM was consulted for hypotension and recommended SDU placement.    Noted to have pancytopenia and AKI with Cr 3.81 up from 0.26 Oct 2017.  In this setting we are asked to see.  Blood cultures have returned + MRSA.  ID following.  Abx have been changed to dapto, doxy, cefepime.  Pt reports abd pain and back pain.  She has some SOB.  Poor appetite.  Mom at bedside.    PMH: Past Medical History:  Diagnosis Date  . Abscess of skin    buttock - most recently 2009/10  . Drug-seeking behavior   . IV drug user   . MRSA (methicillin resistant Staphylococcus aureus) infection    PSH: Past Surgical History:  Procedure Laterality Date  . IR GENERIC HISTORICAL  07/19/2016   IR LUMBAR DISC ASPIRATION W/IMG GUIDE 07/19/2016 Arne Cleveland, MD [facility]-INTERV RAD  . TEE WITHOUT CARDIOVERSION N/A 07/04/2013   Procedure: TRANSESOPHAGEAL ECHOCARDIOGRAM (TEE);  Surgeon: Larey Dresser, MD;  Location: [facility] ENDOSCOPY;  Service: Cardiovascular;  Laterality: N/A;  . TONSILLECTOMY     32 years old    Past Medical History:  Diagnosis Date  . Abscess of skin    buttock - most recently 2009/10  . Drug-seeking behavior   . IV drug user   . MRSA (methicillin resistant Staphylococcus aureus) infection     Medications:   Scheduled: . Chlorhexidine Gluconate  Cloth  6 each Topical Q0600  . doxycycline  100 mg Oral Q12H  . mupirocin ointment  1 application Nasal BID  . nicotine  14 mg Transdermal Daily  . potassium chloride  40 mEq Oral Daily  . sodium chloride flush  3 mL Intravenous Q12H    Medications Prior to Admission  Medication Sig Dispense Refill  . ibuprofen (ADVIL,MOTRIN) 200 MG tablet Take 400 mg by mouth every 6 (six) hours as needed for moderate pain.     Marland Kitchen senna-docusate (SENOKOT-S) 8.6-50 MG tablet Take 1 tablet by mouth at bedtime as needed for mild constipation. (Patient not taking: Reported on 07/12/2017) 15 tablet 0    ALLERGIES:   Allergies  Allergen Reactions  . Vancomycin Other (See Comments)    Possible contributor to AKI and thrombocytopenia    FAM HX: Family History  Problem Relation Age of Onset  . Alcoholism Father     Social History:   reports that she has been smoking cigarettes.  She has a 3.50 pack-year smoking history. She has never used smokeless tobacco. She reports that she has current or past drug history. Drugs: IV and Oxycodone. She reports that she does not drink alcohol.  ROS: ROS: all other systems reviewed and are negative except as per HPI  Blood pressure (!) 88/76, pulse 90, temperature 97.7 F (36.5 C), temperature source Oral,  resp. rate 15, height _0  (1.549 m), weight 44.5 kg (98 lb 1.6 oz), SpO2 97 %. PHYSICAL EXAM: Physical Exam  GEN chronically ill-appearing, cachectic HEENT EOMI PERRL NECK no JVD PULM normal WOB, some inspiratory crackles throughout CV tachycardic, III/VI systolic murmur RUSB with low swooshing diastolic component ABD soft, distended, mildly diffusely tender EXT 1+ LE edema NEURO falls asleep easily SKIN pale and dry, + mult track marks, + nodular rash bilateral LEs and embolic phenomena bilateral toes.  MSK no effusions, L hand chronically swollen it appears   Results for orders placed or performed during the hospital encounter of 04/07/18 (from the past  48 hour(s))  CBC     Status: Abnormal   Collection Time: 04/07/18  9:08 PM  Result Value Ref Range   WBC 3.5 (L) 4.0 - 10.5 K/uL   RBC 3.35 (L) 3.87 - 5.11 MIL/uL   Hemoglobin 8.2 (L) 12.0 - 15.0 g/dL   HCT 26.4 (L) 36.0 - 46.0 %   MCV 78.8 78.0 - 100.0 fL   MCH 24.5 (L) 26.0 - 34.0 pg   MCHC 31.1 30.0 - 36.0 g/dL   RDW 15.9 (H) 11.5 - 15.5 %   Platelets 89 (L) 150 - 400 K/uL    Comment: REPEATED TO VERIFY SPECIMEN CHECKED FOR CLOTS PLATELET COUNT CONFIRMED BY SMEAR Performed at [facility], [address], [place], [place] [address]   Comprehensive metabolic panel     Status: Abnormal   Collection Time: 04/07/18  9:08 PM  Result Value Ref Range   Sodium 130 (L) 135 - 145 mmol/L   Potassium 3.4 (L) 3.5 - 5.1 mmol/L   Chloride 93 (L) 101 - 111 mmol/L   CO2 23 22 - 32 mmol/L   Glucose, Bld 121 (H) 65 - 99 mg/dL   BUN 85 (H) 6 - 20 mg/dL   Creatinine, Ser 3.81 (H) 0.44 - 1.00 mg/dL   Calcium 7.6 (L) 8.9 - 10.3 mg/dL   Total Protein 7.1 6.5 - 8.1 g/dL   Albumin 2.2 (L) 3.5 - 5.0 g/dL   AST 18 15 - 41 U/L   ALT 12 (L) 14 - 54 U/L   Alkaline Phosphatase 93 38 - 126 U/L   Total Bilirubin 0.6 0.3 - 1.2 mg/dL   GFR calc non Af Amer 15 (L) >60 mL/min   GFR calc Af Amer 17 (L) >60 mL/min    Comment: (NOTE) The eGFR has been calculated using the CKD EPI equation. This calculation has not been validated in all clinical situations. eGFR's persistently <60 mL/min signify possible Chronic Kidney Disease.    Anion gap 14 5 - 15    Comment: Performed at [facility] [address], [place], [place] [address]  Urinalysis, Routine w reflex microscopic     Status: Abnormal   Collection Time: 04/07/18  9:16 PM  Result Value Ref Range   Color, Urine AMBER (A) YELLOW    Comment: BIOCHEMICALS MAY BE AFFECTED BY COLOR   APPearance CLOUDY (A) CLEAR   Specific Gravity, Urine 1.015 1.005 - 1.030   pH 5.0 5.0 - 8.0   Glucose, UA 50 (A) NEGATIVE mg/dL   Hgb urine dipstick LARGE  (A) NEGATIVE   Bilirubin Urine NEGATIVE NEGATIVE   Ketones, ur NEGATIVE NEGATIVE mg/dL   Protein, ur 100 (A) NEGATIVE mg/dL   Nitrite NEGATIVE NEGATIVE   Leukocytes, UA MODERATE (A) NEGATIVE   RBC / HPF 21-50 0 - 5 RBC/hpf   WBC,  UA >50 (H) 0 - 5 WBC/hpf   Bacteria, UA RARE (A) NONE SEEN   Squamous Epithelial / LPF 0-5 0 - 5   WBC Clumps PRESENT    Mucus PRESENT    Hyaline Casts, UA PRESENT    Granular Casts, UA PRESENT     Comment: Performed at [facility], [place] [address], [place], [place] [address]  I-stat troponin, ED     Status: None   Collection Time: 04/07/18  9:33 PM  Result Value Ref Range   Troponin i, poc 0.00 0.00 - 0.08 ng/mL   Comment 3            Comment: Due to the release kinetics of cTnI, a negative result within the first hours of the onset of symptoms does not rule out myocardial infarction with certainty. If myocardial infarction is still suspected, repeat the test at appropriate intervals.   I-Stat beta hCG blood, ED     Status: None   Collection Time: 04/07/18  9:33 PM  Result Value Ref Range   I-stat hCG, quantitative <5.0 <5 mIU/mL   Comment 3            Comment:   GEST. AGE      CONC.  (mIU/mL)   <=1 WEEK        5 - 50     2 WEEKS       50 - 500     3 WEEKS       100 - 10,000     4 WEEKS     1,000 - 30,000        FEMALE AND NON-PREGNANT FEMALE:     LESS THAN 5 mIU/mL   I-Stat CG4 Lactic Acid, ED     Status: None   Collection Time: 04/07/18  9:35 PM  Result Value Ref Range   Lactic Acid, Venous 1.57 0.5 - 1.9 mmol/L  Brain natriuretic peptide     Status: None   Collection Time: 04/07/18 10:00 PM  Result Value Ref Range   B Natriuretic Peptide 75.2 0.0 - 100.0 pg/mL    Comment: Performed at [facility], [place] [address], [place], [place] [address]  Blood culture (routine x 2)     Status: None (Preliminary result)   Collection Time: 04/07/18 10:22 PM  Result Value Ref Range   Specimen Description BLOOD LEFT FOREARM    Special  Requests      BOTTLES DRAWN AEROBIC AND ANAEROBIC Blood Culture results may not be optimal due to an inadequate volume of blood received in culture bottles   Culture  Setup Time      GRAM POSITIVE COCCI IN BOTH AEROBIC AND ANAEROBIC BOTTLES CRITICAL RESULT CALLED TO, READ BACK BY AND VERIFIED WITH: PHARMD E Aguilita 04/08/18 AR 1316 BY CM Performed at [facility], [place] [address], [place], [place] [address]    Culture GRAM POSITIVE COCCI    Report Status PENDING   Blood Culture ID Panel (Reflexed)     Status: Abnormal   Collection Time: 04/07/18 10:22 PM  Result Value Ref Range   Enterococcus species NOT DETECTED NOT DETECTED   Listeria monocytogenes NOT DETECTED NOT DETECTED   Staphylococcus species DETECTED (A) NOT DETECTED    Comment: CRITICAL RESULT CALLED TO, READ BACK BY AND VERIFIED WITH: PHARMD E SINCLEAR 04/08/18 AT 1319 BU CM    Staphylococcus aureus DETECTED (A) NOT DETECTED    Comment: Methicillin (oxacillin)-resistant Staphylococcus aureus (MRSA). MRSA is  predictably resistant to beta-lactam antibiotics (except ceftaroline). Preferred therapy is vancomycin unless clinically contraindicated. Patient requires contact precautions if  hospitalized. CRITICAL RESULT CALLED TO, READ BACK BY AND VERIFIED WITH: PHARMD E SINCLEAR 04/08/18 AT 1319 BY CM    Methicillin resistance DETECTED (A) NOT DETECTED    Comment: CRITICAL RESULT CALLED TO, READ BACK BY AND VERIFIED WITH: PHARMD E SINCLEAR 04/08/18 AT 1319 BY CM    Streptococcus species NOT DETECTED NOT DETECTED   Streptococcus agalactiae NOT DETECTED NOT DETECTED   Streptococcus pneumoniae NOT DETECTED NOT DETECTED   Streptococcus pyogenes NOT DETECTED NOT DETECTED   Acinetobacter baumannii NOT DETECTED NOT DETECTED   Enterobacteriaceae species NOT DETECTED NOT DETECTED   Enterobacter cloacae complex NOT DETECTED NOT DETECTED   Escherichia coli NOT DETECTED NOT DETECTED   Klebsiella oxytoca NOT DETECTED NOT DETECTED    Klebsiella pneumoniae NOT DETECTED NOT DETECTED   Proteus species NOT DETECTED NOT DETECTED   Serratia marcescens NOT DETECTED NOT DETECTED   Haemophilus influenzae NOT DETECTED NOT DETECTED   Neisseria meningitidis NOT DETECTED NOT DETECTED   Pseudomonas aeruginosa NOT DETECTED NOT DETECTED   Candida albicans NOT DETECTED NOT DETECTED   Candida glabrata NOT DETECTED NOT DETECTED   Candida krusei NOT DETECTED NOT DETECTED   Candida parapsilosis NOT DETECTED NOT DETECTED   Candida tropicalis NOT DETECTED NOT DETECTED    Comment: Performed at [facility], [place] [address] [place], [place] [address]  Blood culture (routine x 2)     Status: None (Preliminary result)   Collection Time: 04/07/18 10:35 PM  Result Value Ref Range   Specimen Description BLOOD RIGHT ANTECUBITAL    Special Requests      BOTTLES DRAWN AEROBIC AND ANAEROBIC Blood Culture results may not be optimal due to an inadequate volume of blood received in culture bottles   Culture  Setup Time      GRAM POSITIVE COCCI IN BOTH AEROBIC AND ANAEROBIC BOTTLES CRITICAL VALUE NOTED.  VALUE IS CONSISTENT WITH PREVIOUSLY REPORTED AND CALLED VALUE. Performed at [facility], [place] [address], [place], [place] [address]    Culture GRAM POSITIVE COCCI    Report Status PENDING   Culture, blood (single)     Status: None (Preliminary result)   Collection Time: 04/07/18 11:08 PM  Result Value Ref Range   Specimen Description BLOOD RIGHT HAND    Special Requests      BOTTLES DRAWN AEROBIC AND ANAEROBIC Blood Culture results may not be optimal due to an inadequate volume of blood received in culture bottles   Culture  Setup Time      [place].  VALUE IS CONSISTENT WITH PREVIOUSLY REPORTED AND CALLED VALUE. Performed at [facility], [place] [address] [place], [place] [address]    Culture GRAM POSITIVE COCCI    Report Status PENDING   Procalcitonin - Baseline      Status: None   Collection Time: 04/07/18 11:23 PM  Result Value Ref Range   Procalcitonin 7.97 ng/mL    Comment:        Interpretation: PCT > 2 ng/mL: Systemic infection (sepsis) is likely, unless other causes are known. (NOTE)       Sepsis PCT Algorithm           Lower Respiratory Tract  Infection PCT Algorithm    ----------------------------     ----------------------------         PCT < 0.25 ng/mL                PCT < 0.10 ng/mL         Strongly encourage             Strongly discourage   discontinuation of antibiotics    initiation of antibiotics    ----------------------------     -----------------------------       PCT 0.25 - 0.50 ng/mL            PCT 0.10 - 0.25 ng/mL               OR       >80% decrease in PCT            Discourage initiation of                                            antibiotics      Encourage discontinuation           of antibiotics    ----------------------------     -----------------------------         PCT >= 0.50 ng/mL              PCT 0.26 - 0.50 ng/mL               AND       <80% decrease in PCT              Encourage initiation of                                             antibiotics       Encourage continuation           of antibiotics    ----------------------------     -----------------------------        PCT >= 0.50 ng/mL                  PCT > 0.50 ng/mL               AND         increase in PCT                  Strongly encourage                                      initiation of antibiotics    Strongly encourage escalation           of antibiotics                                     -----------------------------                                           PCT <= 0.25 ng/mL  OR                                        > 80% decrease in PCT                                     Discontinue / Do not initiate                                              antibiotics Performed at [facility], [place] [address], [place], [place] [address]   I-Stat arterial blood gas, ED     Status: Abnormal   Collection Time: 04/07/18 11:44 PM  Result Value Ref Range   pH, Arterial 7.387 7.350 - 7.450   pCO2 arterial 36.9 32.0 - 48.0 mmHg   pO2, Arterial 70.0 (L) 83.0 - 108.0 mmHg   Bicarbonate 22.2 20.0 - 28.0 mmol/L   TCO2 23 22 - 32 mmol/L   O2 Saturation 94.0 %   Acid-base deficit 3.0 (H) 0.0 - 2.0 mmol/L   Patient temperature 98.4 F    Collection site RADIAL, ALLEN'S TEST ACCEPTABLE    Drawn by Operator    Sample type ARTERIAL   Basic metabolic panel     Status: Abnormal   Collection Time: 04/07/18 11:46 PM  Result Value Ref Range   Sodium 130 (L) 135 - 145 mmol/L   Potassium 3.5 3.5 - 5.1 mmol/L   Chloride 96 (L) 101 - 111 mmol/L   CO2 22 22 - 32 mmol/L   Glucose, Bld 111 (H) 65 - 99 mg/dL   BUN 85 (H) 6 - 20 mg/dL   Creatinine, Ser 3.65 (H) 0.44 - 1.00 mg/dL   Calcium 7.5 (L) 8.9 - 10.3 mg/dL   GFR calc non Af Amer 16 (L) >60 mL/min   GFR calc Af Amer 18 (L) >60 mL/min    Comment: (NOTE) The eGFR has been calculated using the CKD EPI equation. This calculation has not been validated in all clinical situations. eGFR's persistently <60 mL/min signify possible Chronic Kidney Disease.    Anion gap 12 5 - 15    Comment: Performed at [facility] [address] [place], [place] [address]  Cortisol     Status: None   Collection Time: 04/08/18 12:58 AM  Result Value Ref Range   Cortisol, Plasma 21.2 ug/dL    Comment: (NOTE) AM    6.7 - 22.6 ug/dL PM   <10.0       ug/dL Performed at [facility] [address] [place], [place] [address]   MRSA PCR Screening     Status: Abnormal   Collection Time: 04/08/18  2:27 AM  Result Value Ref Range   MRSA by PCR POSITIVE (A) NEGATIVE    Comment:        The GeneXpert MRSA Assay (FDA approved for NASAL specimens only), is one component of a comprehensive MRSA  colonization surveillance program. It is not intended to diagnose MRSA infection nor to guide or monitor treatment for MRSA infections. RESULT CALLED TO, READ BACK BY AND VERIFIED WITHParks Ranger RN 04/08/18 0801 JDW   Procalcitonin     Status: None  Collection Time: 04/08/18  6:43 AM  Result Value Ref Range   Procalcitonin 7.00 ng/mL    Comment:        Interpretation: PCT > 2 ng/mL: Systemic infection (sepsis) is likely, unless other causes are known. (NOTE)       Sepsis PCT Algorithm           Lower Respiratory Tract                                      Infection PCT Algorithm    ----------------------------     ----------------------------         PCT < 0.25 ng/mL                PCT < 0.10 ng/mL         Strongly encourage             Strongly discourage   discontinuation of antibiotics    initiation of antibiotics    ----------------------------     -----------------------------       PCT 0.25 - 0.50 ng/mL            PCT 0.10 - 0.25 ng/mL               OR       >80% decrease in PCT            Discourage initiation of                                            antibiotics      Encourage discontinuation           of antibiotics    ----------------------------     -----------------------------         PCT >= 0.50 ng/mL              PCT 0.26 - 0.50 ng/mL               AND       <80% decrease in PCT              Encourage initiation of                                             antibiotics       Encourage continuation           of antibiotics    ----------------------------     -----------------------------        PCT >= 0.50 ng/mL                  PCT > 0.50 ng/mL               AND         increase in PCT                  Strongly encourage                                      initiation of antibiotics    Strongly encourage escalation  of antibiotics                                     -----------------------------                                           PCT  <= 0.25 ng/mL                                                 OR                                        > 80% decrease in PCT                                     Discontinue / Do not initiate                                             antibiotics Performed at [facility], [place] [address], [place], [place] [address]   Protime-INR     Status: Abnormal   Collection Time: 04/08/18  6:43 AM  Result Value Ref Range   Prothrombin Time 17.7 (H) 11.4 - 15.2 seconds   INR 1.47     Comment: Performed at [facility] [address] [place], [place] [address]  Basic metabolic panel     Status: Abnormal   Collection Time: 04/08/18  6:43 AM  Result Value Ref Range   Sodium 132 (L) 135 - 145 mmol/L   Potassium 3.9 3.5 - 5.1 mmol/L   Chloride 100 (L) 101 - 111 mmol/L   CO2 23 22 - 32 mmol/L   Glucose, Bld 117 (H) 65 - 99 mg/dL   BUN 82 (H) 6 - 20 mg/dL   Creatinine, Ser 3.22 (H) 0.44 - 1.00 mg/dL   Calcium 7.2 (L) 8.9 - 10.3 mg/dL   GFR calc non Af Amer 18 (L) >60 mL/min   GFR calc Af Amer 21 (L) >60 mL/min    Comment: (NOTE) The eGFR has been calculated using the CKD EPI equation. This calculation has not been validated in all clinical situations. eGFR's persistently <60 mL/min signify possible Chronic Kidney Disease.    Anion gap 9 5 - 15    Comment: Performed at [facility] [address] [place], [place] [address]  CBC WITH DIFFERENTIAL     Status: Abnormal   Collection Time: 04/08/18  6:43 AM  Result Value Ref Range   WBC 2.8 (L) 4.0 - 10.5 K/uL   RBC 2.92 (L) 3.87 - 5.11 MIL/uL   Hemoglobin 7.1 (L) 12.0 - 15.0 g/dL   HCT 23.0 (L) 36.0 - 46.0 %   MCV 78.8 78.0 - 100.0 fL   MCH 24.3 (L) 26.0 - 34.0 pg   MCHC 30.9 30.0 - 36.0 g/dL   RDW 15.9 (H) 11.5 - 15.5 %  Platelets 81 (L) 150 - 400 K/uL    Comment: CONSISTENT WITH PREVIOUS RESULT   Neutrophils Relative % 82 %   Lymphocytes Relative 12 %   Monocytes Relative 5 %   Eosinophils Relative 0 %   Basophils  Relative 1 %   Neutro Abs 2.4 1.7 - 7.7 K/uL   Lymphs Abs 0.3 (L) 0.7 - 4.0 K/uL   Monocytes Absolute 0.1 0.1 - 1.0 K/uL   Eosinophils Absolute 0.0 0.0 - 0.7 K/uL   Basophils Absolute 0.0 0.0 - 0.1 K/uL   WBC Morphology TOXIC GRANULATION     Comment: DOHLE BODIES INCREASED BANDS (>20% BANDS) Performed at [facility] [address] [place], [place] [address]   Magnesium     Status: None   Collection Time: 04/08/18  6:43 AM  Result Value Ref Range   Magnesium 1.7 1.7 - 2.4 mg/dL    Comment: Performed at [facility] [address] [place], [place] [address]  Type and screen Theodore     Status: None (Preliminary result)   Collection Time: 04/08/18  9:19 AM  Result Value Ref Range   ABO/RH(D) O NEG    Antibody Screen NEG    Sample Expiration 04/11/2018    Unit Number [id number]    Blood Component Type RED CELLS,LR    Unit division 00    Status of Unit ALLOCATED    Transfusion Status OK TO TRANSFUSE    Crossmatch Result COMPATIBLE    Unit Number [id number]    Blood Component Type RED CELLS,LR    Unit division 00    Status of Unit ISSUED    Transfusion Status OK TO TRANSFUSE    Crossmatch Result COMPATIBLE   Lactic acid, plasma     Status: None   Collection Time: 04/08/18  9:21 AM  Result Value Ref Range   Lactic Acid, Venous 0.6 0.5 - 1.9 mmol/L    Comment: Performed at [facility], [place] [address] [place], [place] [address]  Prepare RBC     Status: None   Collection Time: 04/08/18  9:37 AM  Result Value Ref Range   Order Confirmation      ORDER PROCESSED BY BLOOD BANK Performed at [facility], [place] [address] [place], [place] [address]     Dg Chest 2 View  Result Date: 04/07/2018 CLINICAL DATA:  Shortness of breath, bilateral lower extremity swelling. EXAM: CHEST - 2 VIEW COMPARISON:  Radiograph and CT scan of Mar 03, 2018. FINDINGS: The heart size and mediastinal contours are within normal limits. No pneumothorax  or pleural effusion is noted. Multiple rounded patchy opacities are seen throughout both lungs concerning for multifocal pneumonia. Right basilar subsegmental atelectasis is noted. The visualized skeletal structures are unremarkable. IMPRESSION: Multiple rounded patchy opacities are noted throughout both lungs most consistent with multifocal pneumonia. Mild right basilar subsegmental atelectasis is noted. Electronically Signed   By: Marijo Conception, M.D.   On: 04/07/2018 21:45    Assessment/Plan  1.  AKI: differential for AKI multifactorial- hemodynamically mediated, septic emboli to kidney parenchyma, syn-infectious GN which can especially be seen with MRSA.  Less likely I think is TTP (given pancytopenia) which can be seen with IV Opana use (pt has used Opana in the past).  I'll send LDH/ haptoglobin/ save smear, renal US, complements, HIV, hepatitis, ANA, ANCA.  UA with WBC and RBC, has had a longstanding h/o hematuria on UA.  Send UP/C.  Fortunately pt is still making urine and has better renal function after IVFs.  No immediate needs for dialysis.   2.  Severe sepsis secondary to MRSA bacteremia: she has all the stigmata of recurrent endocarditis.  ID following, antibiotics per them.  May need repeat MRI of spine due to severe back pain.  TTE pending.    3.  Pancytopenia: I suspect due to severe sepsis- will send some TTP workup as above in #1.  4.  Dispo: SDU   Molly [name] 04/08/2018, 4:06 PM

## 2018-04-08 NOTE — Progress Notes (Signed)
Informed Dr. Loleta Books that patient is currently getting one unit of blood, but has two MRI's ordered, a renal ultrasound ordered and CT ordered. Per MD ok to received testing in between transfusing units. Informed MD that patients BP is 80's/70's and patient is requesting pain medication. Per MD ok to give oxycodone at this time. Per MD also OK for patient to come off tele for testing.

## 2018-04-08 NOTE — Progress Notes (Signed)
Informed Dr. Loleta Books that patients BP is 81/52 this am and patient responds to voice and light stimuli and able to answer all orientation questions but falls asleep during exam and that patient is positive for MRSA in nares. MD to see patient and place orders. Per MD call him with Lactic Acid results.

## 2018-04-08 NOTE — Progress Notes (Signed)
Patient states she does not want to have test preformed at this time she just wants to sleep. Patient educated on the importance of getting test preformed. Patient agreed to go have test preformed.

## 2018-04-08 NOTE — Progress Notes (Signed)
04/08/18 @ 1627. PT refused exam due to pain. States unable to move or lay flat.

## 2018-04-08 NOTE — Progress Notes (Signed)
PHARMACY - PHYSICIAN COMMUNICATION CRITICAL VALUE ALERT - BLOOD CULTURE IDENTIFICATION (BCID)  Molly [name] is an 32 y.o. female who presented to [facility] on 04/07/2018 with a chief complaint of fevers, SOB, chest discomfort.   Assessment: 32 year old female with history of MRSA bacteremia/endocarditis and osteomyelitis. Now found to have MRSA in 4/4 blood cultures.   Name of physician (or Provider) Contacted: [place]  Current antibiotics: Linezolid/Cefepime  Changes to prescribed antibiotics recommended:  Will D/C cefepime  Switch linezolid to Daptomycin  Results for orders placed or performed during the hospital encounter of 04/07/18  Blood Culture ID Panel (Reflexed) (Collected: 04/07/2018 10:22 PM)  Result Value Ref Range   Enterococcus species NOT DETECTED NOT DETECTED   Listeria monocytogenes NOT DETECTED NOT DETECTED   Staphylococcus species DETECTED (A) NOT DETECTED   Staphylococcus aureus DETECTED (A) NOT DETECTED   Methicillin resistance DETECTED (A) NOT DETECTED   Streptococcus species NOT DETECTED NOT DETECTED   Streptococcus agalactiae NOT DETECTED NOT DETECTED   Streptococcus pneumoniae NOT DETECTED NOT DETECTED   Streptococcus pyogenes NOT DETECTED NOT DETECTED   Acinetobacter baumannii NOT DETECTED NOT DETECTED   Enterobacteriaceae species NOT DETECTED NOT DETECTED   Enterobacter cloacae complex NOT DETECTED NOT DETECTED   Escherichia coli NOT DETECTED NOT DETECTED   Klebsiella oxytoca NOT DETECTED NOT DETECTED   Klebsiella pneumoniae NOT DETECTED NOT DETECTED   Proteus species NOT DETECTED NOT DETECTED   Serratia marcescens NOT DETECTED NOT DETECTED   Haemophilus influenzae NOT DETECTED NOT DETECTED   Neisseria meningitidis NOT DETECTED NOT DETECTED   Pseudomonas aeruginosa NOT DETECTED NOT DETECTED   Candida albicans NOT DETECTED NOT DETECTED   Candida glabrata NOT DETECTED NOT DETECTED   Candida krusei NOT DETECTED NOT DETECTED   Candida parapsilosis  NOT DETECTED NOT DETECTED   Candida tropicalis NOT DETECTED NOT DETECTED   Jimmy Footman, PharmD, BCPS PGY2 Infectious Diseases Pharmacy Resident Phone: [phone] 04/08/2018  1:22 PM

## 2018-04-08 NOTE — H&P (Signed)
History and Physical    Molly [name] YKD:[id number] DOB: 1985/11/08 DOA: 04/07/2018  PCP: Patient, No Pcp Per   Patient coming from: Home  Chief Complaint: Fevers, SOB, chest discomfort, swelling and rash to bilateral lower legs   HPI: Molly [name] is a 32 y.o. 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  Patient had been following with ID and managed with suppressive doxycycline until she stopped taking it in August 2018.  The vertebral osteomyelitis had seemed resolved and she had been abstinent from IV drug abuse for a while.  Unfortunately, she has recently relapsed with regard to IVDA and has recently developed fevers, chills, chest discomfort, shortness of breath, and swelling to the bilateral lower extremities with petechial rash.  She denies headache, change in vision or hearing, or focal numbness or weakness.  ED Course: Upon arrival to the ED, patient is found to be afebrile, saturating well on room air, tachycardic to 140, and with blood pressure 77/47. EKG features sinus tachycardia with rate 120 and CXR is concerning for a multifocal PNA with rounded opacities throughout bilateral lungs.  Chemistry panel is notable for sodium of 130, potassium 3.4, BUN 85, and creatinine of 3.81, up from 0.97 last January.  CBC features a pancytopenia with WBC 3500, IMA globin 8.2, and platelets 89,000.  Lactic acid is reassuringly normal, troponin is undetectable, BNP is normal.  Blood cultures were collected x3, 30 cc/kg of LR was given, and she was started on empiric the nasal lid and cefepime.  Critical care was consulted by the ED physician, has evaluated the patient in the emergency department, and finds her suitable for medical admission given  the normalization of her blood pressure with fluid resuscitation.  Patient will be admitted for ongoing evaluation and management of severe sepsis concerning for recurrent endocarditis.   Review of Systems:  All other systems reviewed and apart from HPI, are negative.  Past Medical History:  Diagnosis Date  . Abscess of skin    buttock - most recently 2009/10  . Drug-seeking behavior   . IV drug user   . MRSA (methicillin resistant Staphylococcus aureus) infection     Past Surgical History:  Procedure Laterality Date  . IR GENERIC HISTORICAL  07/19/2016   IR LUMBAR DISC ASPIRATION W/IMG GUIDE 07/19/2016 Arne Cleveland, MD [facility]-INTERV RAD  . TEE WITHOUT CARDIOVERSION N/A 07/04/2013   Procedure: TRANSESOPHAGEAL ECHOCARDIOGRAM (TEE);  Surgeon: Larey Dresser, MD;  Location: [facility] ENDOSCOPY;  Service: Cardiovascular;  Laterality: N/A;  . TONSILLECTOMY     32 years old     reports that she has been smoking cigarettes.  She has a 3.50 pack-year smoking history. She has never used smokeless tobacco. She reports that she has current or past drug history. Drugs: IV and Oxycodone. She reports that she does not drink alcohol.  Allergies  Allergen Reactions  . Vancomycin Other (See Comments)    Possible contributor to AKI and thrombocytopenia    Family History  Problem Relation Age of Onset  . Alcoholism Father      Prior to Admission medications   Medication Sig Start Date End Date Taking? Authorizing Provider  ibuprofen (ADVIL,MOTRIN) 200 MG tablet Take 400 mg by mouth every 6 (six) hours as needed for moderate pain.    Yes [provider]  senna-docusate (SENOKOT-S) 8.6-50 MG tablet Take 1 tablet by mouth at bedtime as needed for mild constipation. Patient not taking: Reported on 07/12/2017 07/21/16   Florinda Marker, MD    Physical Exam: Vitals:   04/08/18 0015 04/08/18 0030 04/08/18 0045 04/08/18 0100  BP: (!) 84/55 (!) 88/70 93/63 95/62   Pulse: (!) 104 (!) 110 (!) 113 (!) 110   Resp: 12 (!) 27 (!) 22 18  Temp:      SpO2: 98% 99% 98% 97%  Weight:      Height:          Constitutional: not in acute distress, pale, generally ill-appearing Eyes: PERTLA, lids and conjunctivae normal ENMT: Mucous membranes are moist. Posterior pharynx clear of any exudate or lesions.   Neck: normal, supple, no masses, no thyromegaly Respiratory: Scattered rhonchi bilaterally. No accessory muscle use.  Cardiovascular: Rate ~100 and regular. Grade 3 SEM. Pretibial pitting edema to bilateral LE's. Abdomen: No distension, no tenderness, soft. Bowel sounds active.  Musculoskeletal: no clubbing / cyanosis. No joint deformity upper and lower extremities.   Skin: Tender petechia about the distal LE's bilaterally. Warm, dry, well-perfused. Neurologic: CN 2-12 grossly intact. Sensation intact. Strength 5/5 in all 4 limbs.  Psychiatric: Alert and oriented x 3. Calm, cooperative.     Labs on Admission: I have personally reviewed following labs and imaging studies  CBC: Recent Labs  Lab 04/07/18 2108  WBC 3.5*  HGB 8.2*  HCT 26.4*  MCV 78.8  PLT 89*   Basic Metabolic Panel: Recent Labs  Lab 04/07/18 2108  NA 130*  K 3.4*  CL 93*  CO2 23  GLUCOSE 121*  BUN 85*  CREATININE 3.81*  CALCIUM 7.6*   GFR: Estimated Creatinine Clearance: 14.1 mL/min (A) (by C-G formula based on SCr of 3.81 mg/dL (H)). Liver Function Tests: Recent Labs  Lab 04/07/18 2108  AST 18  ALT 12*  ALKPHOS 93  BILITOT 0.6  PROT 7.1  ALBUMIN 2.2*   No results for input(s): LIPASE, AMYLASE in the last 168 hours. No results for input(s): AMMONIA in the last 168 hours. Coagulation Profile: No results for input(s): INR, PROTIME in the last 168 hours. Cardiac Enzymes: No results for input(s): CKTOTAL, CKMB, CKMBINDEX, TROPONINI in the last 168 hours. BNP (last 3 results) No results for input(s): PROBNP in the last 8760 hours. HbA1C: No results for input(s): HGBA1C in the last 72 hours. CBG: No  results for input(s): GLUCAP in the last 168 hours. Lipid Profile: No results for input(s): CHOL, HDL, LDLCALC, TRIG, CHOLHDL, LDLDIRECT in the last 72 hours. Thyroid Function Tests: No results for input(s): TSH, T4TOTAL, FREET4, T3FREE, THYROIDAB in the last 72 hours. Anemia Panel: No results for input(s): VITAMINB12, FOLATE, FERRITIN, TIBC, IRON, RETICCTPCT in the last 72 hours. Urine analysis:    Component Value Date/Time   COLORURINE AMBER (A) 04/07/2018 2116   APPEARANCEUR CLOUDY (A) 04/07/2018 2116   LABSPEC 1.015 04/07/2018 2116   PHURINE 5.0 04/07/2018 2116   GLUCOSEU 50 (A) 04/07/2018 2116   HGBUR LARGE (A) 04/07/2018 2116   BILIRUBINUR NEGATIVE 04/07/2018 2116   [place] NEGATIVE 04/07/2018 2116   PROTEINUR 100 (A) 04/07/2018 2116   NITRITE NEGATIVE 04/07/2018 2116   LEUKOCYTESUR MODERATE (A) 04/07/2018 2116   Sepsis Labs: @LABRCNTIP (procalcitonin:4,lacticidven:4) )No results found for this or any previous visit (from the past 240 hour(s)).   Radiological Exams on Admission: Dg Chest 2 View  Result Date: 04/07/2018 CLINICAL DATA:  Shortness of breath, bilateral lower  extremity swelling. EXAM: CHEST - 2 VIEW COMPARISON:  Radiograph and CT scan of Mar 03, 2018. FINDINGS: The heart size and mediastinal contours are within normal limits. No pneumothorax or pleural effusion is noted. Multiple rounded patchy opacities are seen throughout both lungs concerning for multifocal pneumonia. Right basilar subsegmental atelectasis is noted. The visualized skeletal structures are unremarkable. IMPRESSION: Multiple rounded patchy opacities are noted throughout both lungs most consistent with multifocal pneumonia. Mild right basilar subsegmental atelectasis is noted. Electronically Signed   By: Marijo Conception, M.D.   On: 04/07/2018 21:45    EKG: Independently reviewed. Sinus tachycardia (rate 120).   Assessment/Plan   1. Severe sepsis; multifocal pneumonia; suspected endocarditis  -  Presents with petechial rash on bilateral lower legs, fevers, chest discomfort, and SOB  - Found to be febrile and tachycardic with hypotension, leukopenia, and CXR findings consistent with multifocal PNA  - There is concern for recurrent endocarditis given ongoing IVDA, petechial rash, murmur, suspected septic emboli to lungs  - Blood cultures x3 collected in ED, 30 cc/kg LR bolus given, and she was started on empiric linezolid and cefepime  - BP normalized with fluid-resuscitation   - Continue linezolid and cefepime while following cultures and clinical course, check echo   2. Acute kidney injury  - SCr is 3.81 on admission, up from priors <1  - Fluid-resuscitated in ED with 30 cc/kg LR  - Renally-dose medications, check urine chemistries, continue IVF hydration, avoid nephrotoxins, repeat chem panel in am    3. Pancytopenia  - WBC is 3,500 and improved from priors  - Hgb is 8.2 with no active bleeding noted  - Platelets stable at 89k  - Sepsis may be contributing, treated as above  - Trend CBC    4. IVDA  - Unfortunately, she has relapsed  - SW consultation requested    5. Hx of endocarditis; hx of thoracic vertebral osteomyelitis  - Hx of MRSA bacteremia with native valve endocarditis and osteomyelitis involving thoracic spine  - Follows with ID and osteomyelitis had seemed to resolved at last visit; no evidence for active osteo on plain radiographs this admit  - Concern for active endocarditis as outlined above    6. Hyponatremia  - Serum sodium is 130 on admission in setting of hypovolemia  - Fluid-resuscitated in ED with 30 cc/kg LR  - Continue IVF hydration, repeat chem panel in am     DVT prophylaxis: SCD's  Code Status: Full  Family Communication: Discussed with patient Consults called: PCCM Admission status: Inpatient    Vianne Bulls, MD [facility] Pager [phone]  If 7PM-7AM, please contact night-coverage [email or web address] Password  [email or web address]  04/08/2018, 1:09 AM

## 2018-04-08 NOTE — Progress Notes (Signed)
PROGRESS NOTE    Molly [name]  JOI:[id number] DOB: Jan 20, 1986 DOA: 04/07/2018 PCP: Patient, No Pcp Per      Brief Narrative:  Molly [name] is a 32 y.o. 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  Blood cultures obtained and started on linezolid and cefepime.   The patient was recently seen at [facility]  At that time, she had fever, hemoptysis.  This was in mid May 2019: -CXR and CT showed dense RML consolidation. -Hgb 10 g/dL -Cr 0.7 mg/dL -Blood cultures x2 negative -proBNP >1000, but no swelling      Assessment & Plan:  Sepsis Suspected source blood. Organism unknown. Patient presents with tachycardia, tachypnea, leukopenia and organ dysfunction (pancytopenia, AKI, and hypotension). Responding to fluids, lactates normal.  CCM involved.  Antibiotics delivered in the ED.   -Sepsis bundle utilized:  -Blood and urine cultures drawn  -30 ml/kg bolus given in ED, will repeat lactic acid  -Antibiotics: linezolid and cefepime (discussed informally today with ID)  -Echo ordered  Back pain If MRSA, low threshold to MRI back  Knee pain If MRSA, low threshold to MRI knee  Acute renal failure Baseline 0.9 mg/dL, admission at 3.8, minimal improvement overnight with fluid challenge.  -Check urine lytes -Check renal US -Consult nephrology -Consult to palliative care -Check HIV, hep C RNA and hep B SAg  Leg swelling -Check BNP -Obtain echo  Rash This is petechial.  Pancytopenia Hgb down to 7.1 g/dL today with fluids.  Patient weak and tired.   -Transfuse 2 units now -Trend CBC daily  IVDU If patient stabilizes and prognosis improves, will consult IM re: suboxone  Hyponatremia -Obtain urine sodium, osms  Hypokalemia -Supplement K     DVT prophylaxis: SCds Code Status: FULL Family Communication: Mother at  bedside MDM and disposition Plan: The below labs and imaging reports were reviewed and summarized above.    The patient was admitted with history IVDU, endocarditis with MRSA, presents with clinical CHF, rash, pain, apparent sepsis, renal failure.    Cultures pending.  Nephrology consulted. ID will be consulted when we have positive identification of infection.   Consultants:   Nephrology  Palliative Care  Procedures:   Echocardiogram  Antimicrobials:   Linezolid 6/21 >>   Cefepime 6/21 >>   Subjective: Feels a ton of back pain when she moves.  Arm hurts, knee hurts.  No cough.  Had some hemoptysis recently.  Objective: Vitals:   04/08/18 1300 04/08/18 1301 04/08/18 1302 04/08/18 1303  BP:      Pulse: 92 93 90 90  Resp: 16 16 16 17   Temp:      TempSrc:      SpO2: 97% 97% 97% 97%  Weight:      Height:        Intake/Output Summary (Last 24 hours) at 04/08/2018 1311 Last data filed at 04/08/2018 4709 Gross per 24 hour  Intake 1996.4 ml  Output -  Net 1996.4 ml   Filed Weights   04/07/18 2237 04/08/18 0100  Weight: 41.3 kg (91 lb) 44.5 kg (98 lb 1.6 oz)    Examination: General appearance: thin adult female, awake but listless, mentating well, no obvious distress but a lot of pain with movement.   HEENT: Anicteric, conjunctiva pink, lids and lashes normal. No nasal deformity, discharge, epistaxis.  Lips moist, teeth normal,  OP tacky dry, no oral lesions, hearing normal.   Skin: Warm and dry.  no jaundice.  Petechial nonblanching rash with pustules on legs, scattered:   Cardiac: Tachcyardic, regular, nl S1-S2, don't appreciate a murmur.  Capillary refill is brisk. 1+ LE edema.  Radial pulses 2+ and symmetric. Respiratory: Tachypneic, not otherwise increase WOB.  No rales. Abdomen: Abdomen soft.  No TTP. No ascites, distension, hepatosplenomegaly.   MSK: No deformities or effusions in large jionts of upper or lower extremities bilaterally. Neuro: Awake and alert.   EOMI, moves all extremities. Speech fluent.    Psych: Sensorium intact and responding to questions, attention normal. Affect blunted, in pain.  Judgment and insight appear normal.    Data Reviewed: I have personally reviewed following labs and imaging studies:  CBC: Recent Labs  Lab 04/07/18 2108 04/08/18 0643  WBC 3.5* 2.8*  NEUTROABS  --  2.4  HGB 8.2* 7.1*  HCT 26.4* 23.0*  MCV 78.8 78.8  PLT 89* 81*   Basic Metabolic Panel: Recent Labs  Lab 04/07/18 2108 04/07/18 2346 04/08/18 0643  NA 130* 130* 132*  K 3.4* 3.5 3.9  CL 93* 96* 100*  CO2 23 22 23   GLUCOSE 121* 111* 117*  BUN 85* 85* 82*  CREATININE 3.81* 3.65* 3.22*  CALCIUM 7.6* 7.5* 7.2*  MG  --   --  1.7   GFR: Estimated Creatinine Clearance: 17.9 mL/min (A) (by C-G formula based on SCr of 3.22 mg/dL (H)). Liver Function Tests: Recent Labs  Lab 04/07/18 2108  AST 18  ALT 12*  ALKPHOS 93  BILITOT 0.6  PROT 7.1  ALBUMIN 2.2*   No results for input(s): LIPASE, AMYLASE in the last 168 hours. No results for input(s): AMMONIA in the last 168 hours. Coagulation Profile: Recent Labs  Lab 04/08/18 0643  INR 1.47   Cardiac Enzymes: No results for input(s): CKTOTAL, CKMB, CKMBINDEX, TROPONINI in the last 168 hours. BNP (last 3 results) No results for input(s): PROBNP in the last 8760 hours. HbA1C: No results for input(s): HGBA1C in the last 72 hours. CBG: No results for input(s): GLUCAP in the last 168 hours. Lipid Profile: No results for input(s): CHOL, HDL, LDLCALC, TRIG, CHOLHDL, LDLDIRECT in the last 72 hours. Thyroid Function Tests: No results for input(s): TSH, T4TOTAL, FREET4, T3FREE, THYROIDAB in the last 72 hours. Anemia Panel: No results for input(s): VITAMINB12, FOLATE, FERRITIN, TIBC, IRON, RETICCTPCT in the last 72 hours. Urine analysis:    Component Value Date/Time   COLORURINE AMBER (A) 04/07/2018 2116   APPEARANCEUR CLOUDY (A) 04/07/2018 2116   LABSPEC 1.015 04/07/2018 2116    PHURINE 5.0 04/07/2018 2116   GLUCOSEU 50 (A) 04/07/2018 2116   HGBUR LARGE (A) 04/07/2018 2116   BILIRUBINUR NEGATIVE 04/07/2018 2116   [place] NEGATIVE 04/07/2018 2116   PROTEINUR 100 (A) 04/07/2018 2116   NITRITE NEGATIVE 04/07/2018 2116   LEUKOCYTESUR MODERATE (A) 04/07/2018 2116   Sepsis Labs: @LABRCNTIP (procalcitonin:4,lacticacidven:4)  ) Recent Results (from the past 240 hour(s))  Blood culture (routine x 2)     Status: None (Preliminary result)   Collection Time: 04/07/18 10:22 PM  Result Value Ref Range Status   Specimen Description BLOOD LEFT FOREARM  Final   Special Requests   Final    BOTTLES DRAWN AEROBIC AND ANAEROBIC Blood Culture results may not be optimal due to an inadequate volume of blood received in culture bottles   Culture  Setup Time   Final    GRAM POSITIVE COCCI IN BOTH AEROBIC  AND ANAEROBIC BOTTLES Organism ID to follow Performed at [facility], [place] [address] [place], [place] [address]    Culture GRAM POSITIVE COCCI  Final   Report Status PENDING  Incomplete  Blood culture (routine x 2)     Status: None (Preliminary result)   Collection Time: 04/07/18 10:35 PM  Result Value Ref Range Status   Specimen Description BLOOD RIGHT ANTECUBITAL  Final   Special Requests   Final    BOTTLES DRAWN AEROBIC AND ANAEROBIC Blood Culture results may not be optimal due to an inadequate volume of blood received in culture bottles   Culture  Setup Time   Final    GRAM POSITIVE COCCI IN BOTH AEROBIC AND ANAEROBIC BOTTLES Performed at [facility], [place] [address], [place], [place] [address]    Culture GRAM POSITIVE COCCI  Final   Report Status PENDING  Incomplete  MRSA PCR Screening     Status: Abnormal   Collection Time: 04/08/18  2:27 AM  Result Value Ref Range Status   MRSA by PCR POSITIVE (A) NEGATIVE Final    Comment:        The GeneXpert MRSA Assay (FDA approved for NASAL specimens only), is one component of a comprehensive MRSA  colonization surveillance program. It is not intended to diagnose MRSA infection nor to guide or monitor treatment for MRSA infections. RESULT CALLED TO, READ BACK BY AND VERIFIED WITHParks Ranger RN 04/08/18 0801 JDW          Radiology Studies: Dg Chest 2 View  Result Date: 04/07/2018 CLINICAL DATA:  Shortness of breath, bilateral lower extremity swelling. EXAM: CHEST - 2 VIEW COMPARISON:  Radiograph and CT scan of Mar 03, 2018. FINDINGS: The heart size and mediastinal contours are within normal limits. No pneumothorax or pleural effusion is noted. Multiple rounded patchy opacities are seen throughout both lungs concerning for multifocal pneumonia. Right basilar subsegmental atelectasis is noted. The visualized skeletal structures are unremarkable. IMPRESSION: Multiple rounded patchy opacities are noted throughout both lungs most consistent with multifocal pneumonia. Mild right basilar subsegmental atelectasis is noted. Electronically Signed   By: Marijo Conception, M.D.   On: 04/07/2018 21:45        Scheduled Meds: . Chlorhexidine Gluconate Cloth  6 each Topical Q0600  . mupirocin ointment  1 application Nasal BID  . nicotine  14 mg Transdermal Daily  . sodium chloride flush  3 mL Intravenous Q12H   Continuous Infusions: . ceFEPime (MAXIPIME) IV    . linezolid (ZYVOX) IV 600 mg (04/08/18 0938)     LOS: 0 days    CRITICAL CARE Performed by: Edwin Dada  The patient presents with severe infection with life threatening multiple organ failure, including hypotension requiring ongoing fluids, renal failure, and MRSA bacteremia. Total critical care time: 60 minutes Critical care time was exclusive of separately billable procedures and treating other patients. Critical care was necessary to treat or prevent imminent or life-threatening deterioration. Critical care was time spent personally by me on the following activities: development of treatment plan with patient  and/or surrogate as well as nursing, discussions with consultants, evaluation of patient's response to treatment, examination of patient, obtaining history from patient or surrogate, ordering and performing treatments and interventions, ordering and review of laboratory studies, ordering and review of radiographic studies, pulse oximetry and re-evaluation of patient's condition.     Edwin Dada, MD [facility] 04/08/2018, 1:11 PM     Pager [phone] --- please  page though AMION:  [email or web address] Password [email or web address] If 7PM-7AM, please contact night-coverage

## 2018-04-08 NOTE — Progress Notes (Signed)
Informed. Dr. Loleta Books that patients Lactic acid is 0.6 and that per blood bank patients type and screen will take longer than normal due to patient having a history of antibodies. MD acknowledged no new orders received,

## 2018-04-08 NOTE — Progress Notes (Signed)
Pharmacy Antibiotic Note  Molly [name] is a 32 y.o. female admitted on 04/07/2018 with SOB/PNA/sepsis .  Pharmacy has been consulted for Cefepime dosing.  Plan: Cefepime 1 g IV q24h  Height: 5\' 1"  (154.9 cm) Weight: 91 lb (41.3 kg) IBW/kg (Calculated) : 47.8  Temp (24hrs), Avg:98.4 F (36.9 C), Min:98.4 F (36.9 C), Max:98.4 F (36.9 C)  Recent Labs  Lab 04/07/18 2108 04/07/18 2135 04/07/18 2346  WBC 3.5*  --   --   CREATININE 3.81*  --  3.65*  LATICACIDVEN  --  1.57  --     Estimated Creatinine Clearance: 14.7 mL/min (A) (by C-G formula based on SCr of 3.65 mg/dL (H)).    Allergies  Allergen Reactions  . Vancomycin Other (See Comments)    Possible contributor to AKI and thrombocytopenia   Caryl Pina 04/08/2018 1:33 AM

## 2018-04-08 NOTE — Progress Notes (Signed)
Pharmacy Antibiotic Note  Molly [name] is a 32 y.o. female admitted on 04/07/2018 with SOB/ chest discomfort.   Pharmacy has been consulted for daptomycin dosing. Patient with history of IVDA, endocarditis, and thoracic osteomyelitis on prophylactic doxycyline until stopped 05/2017 (patient self-discontinued).  Scr is elevated at 3.81 on presentation with pancytopenia. Afebrile at this time. Received linezolid due to vanc intolerance (thrombocytopenia) and cefepime until BCID resulted with MRSA. Antibiotics changed to daptomcyin 8mg /kg dosing and doxycycline added by ID for possible septic emboli/ pulmonary involvement. Follow for cardiology evaluation for endocarditis and MRI for osteo workup.  Plan: Daptomycin 350 mg IV q24 (8 mg/kg) Weekly CK, baseline ordered  Doxycycline 100mg  PO BID   Height: 5\' 1"  (154.9 cm) Weight: 98 lb 1.6 oz (44.5 kg) IBW/kg (Calculated) : 47.8  Temp (24hrs), Avg:98 F (36.7 C), Min:97.7 F (36.5 C), Max:98.4 F (36.9 C)  Recent Labs  Lab 04/07/18 2108 04/07/18 2135 04/07/18 2346 04/08/18 0643 04/08/18 0921  WBC 3.5*  --   --  2.8*  --   CREATININE 3.81*  --  3.65* 3.22*  --   LATICACIDVEN  --  1.57  --   --  0.6    Estimated Creatinine Clearance: 17.9 mL/min (A) (by C-G formula based on SCr of 3.22 mg/dL (H)).    Allergies  Allergen Reactions  . Vancomycin Other (See Comments)    Possible contributor to AKI and thrombocytopenia    Jalene Mullet, Pharm.D. PGY1 Pharmacy Resident 04/08/2018 2:12 PM Please check AMION for all Van numbers

## 2018-04-08 NOTE — Progress Notes (Signed)
Informed MRI that patient unit of blood is finished per MRI have patient scheduled to do MRI, renal US and CT of chest one right after the other. Per Dr. Loleta Books ok for patient to hold off on blood transfusions to have test preformed.

## 2018-04-09 DIAGNOSIS — Z515 Encounter for palliative care: Secondary | ICD-10-CM

## 2018-04-09 DIAGNOSIS — G8929 Other chronic pain: Secondary | ICD-10-CM

## 2018-04-09 DIAGNOSIS — I330 Acute and subacute infective endocarditis: Secondary | ICD-10-CM

## 2018-04-09 DIAGNOSIS — Z7189 Other specified counseling: Secondary | ICD-10-CM

## 2018-04-09 DIAGNOSIS — M546 Pain in thoracic spine: Secondary | ICD-10-CM

## 2018-04-09 LAB — TYPE AND SCREEN
ABO/RH(D): O NEG
ANTIBODY SCREEN: NEGATIVE
UNIT DIVISION: 0
UNIT DIVISION: 0

## 2018-04-09 LAB — SAVE SMEAR

## 2018-04-09 LAB — PROTEIN / CREATININE RATIO, URINE
CREATININE, URINE: 82.13 mg/dL
PROTEIN CREATININE RATIO: 1.38 mg/mg{creat} — AB (ref 0.00–0.15)
Total Protein, Urine: 113 mg/dL

## 2018-04-09 LAB — LACTATE DEHYDROGENASE: LDH: 81 U/L — ABNORMAL LOW (ref 98–192)

## 2018-04-09 LAB — HEPATIC FUNCTION PANEL
ALT: 10 U/L — ABNORMAL LOW (ref 14–54)
AST: 14 U/L — ABNORMAL LOW (ref 15–41)
Albumin: 1.5 g/dL — ABNORMAL LOW (ref 3.5–5.0)
Alkaline Phosphatase: 86 U/L (ref 38–126)
BILIRUBIN DIRECT: 0.2 mg/dL (ref 0.1–0.5)
BILIRUBIN INDIRECT: 0.5 mg/dL (ref 0.3–0.9)
TOTAL PROTEIN: 6 g/dL — AB (ref 6.5–8.1)
Total Bilirubin: 0.7 mg/dL (ref 0.3–1.2)

## 2018-04-09 LAB — BASIC METABOLIC PANEL
Anion gap: 8 (ref 5–15)
BUN: 77 mg/dL — AB (ref 6–20)
CALCIUM: 7.6 mg/dL — AB (ref 8.9–10.3)
CO2: 23 mmol/L (ref 22–32)
CREATININE: 3.18 mg/dL — AB (ref 0.44–1.00)
Chloride: 103 mmol/L (ref 101–111)
GFR calc Af Amer: 21 mL/min — ABNORMAL LOW (ref >60)
GFR, EST NON AFRICAN AMERICAN: 18 mL/min — AB (ref >60)
GLUCOSE: 124 mg/dL — AB (ref 65–99)
Potassium: 3.8 mmol/L (ref 3.5–5.1)
SODIUM: 134 mmol/L — AB (ref 135–145)

## 2018-04-09 LAB — BPAM RBC
BLOOD PRODUCT EXPIRATION DATE: 201907262359
BLOOD PRODUCT EXPIRATION DATE: 201907262359
ISSUE DATE / TIME: 201906221245
ISSUE DATE / TIME: 201906221841
UNIT TYPE AND RH: 9500
Unit Type and Rh: 9500

## 2018-04-09 LAB — HEPATITIS B SURFACE ANTIGEN: Hepatitis B Surface Ag: NEGATIVE

## 2018-04-09 LAB — CK: CK TOTAL: 17 U/L — AB (ref 38–234)

## 2018-04-09 LAB — HCV RNA QUANT: HCV Quantitative: NOT DETECTED IU/mL (ref >50)

## 2018-04-09 LAB — PROCALCITONIN: PROCALCITONIN: 4.89 ng/mL

## 2018-04-09 LAB — OSMOLALITY, URINE: Osmolality, Ur: 377 mOsm/kg (ref 300–900)

## 2018-04-09 LAB — OSMOLALITY: OSMOLALITY: 309 mosm/kg — AB (ref 275–295)

## 2018-04-09 LAB — SODIUM, URINE, RANDOM: SODIUM UR: 13 mmol/L

## 2018-04-09 LAB — CREATININE, URINE, RANDOM: CREATININE, URINE: 82.63 mg/dL

## 2018-04-09 MED ORDER — OXYCODONE HCL 5 MG PO TABS
7.5000 mg | ORAL_TABLET | ORAL | Status: DC | PRN
  Administered 2018-04-09 – 2018-04-10 (×4): 7.5 mg via ORAL
  Filled 2018-04-09 (×4): qty 2

## 2018-04-09 MED ORDER — SODIUM CHLORIDE 0.9 % IV BOLUS
500.0000 mL | Freq: Once | INTRAVENOUS | Status: AC
  Administered 2018-04-09: 500 mL via INTRAVENOUS

## 2018-04-09 MED ORDER — DIPHENHYDRAMINE HCL 25 MG PO CAPS
25.0000 mg | ORAL_CAPSULE | Freq: Four times a day (QID) | ORAL | Status: DC | PRN
  Administered 2018-04-09: 25 mg via ORAL
  Filled 2018-04-09: qty 1

## 2018-04-09 MED ORDER — SODIUM CHLORIDE 0.9 % IV SOLN
INTRAVENOUS | Status: DC | PRN
  Administered 2018-04-09 – 2018-04-12 (×2): via INTRAVENOUS

## 2018-04-09 MED ORDER — DAPTOMYCIN 500 MG IV SOLR
350.0000 mg | INTRAVENOUS | Status: DC
  Filled 2018-04-09: qty 7

## 2018-04-09 NOTE — Progress Notes (Signed)
PROGRESS NOTE    Molly [name]  UJW:[id number] DOB: July 16, 1986 DOA: 04/07/2018 PCP: Patient, No Pcp Per      Brief Narrative:  Ms Hane is a 32 y.o. 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  Blood cultures obtained and started on linezolid and cefepime.   The patient was recently seen at [facility].  At that time, she had fever, hemoptysis.  This was in mid May 2019: -CXR and CT showed dense RML consolidation. -Hgb 10 g/dL -Cr 0.7 mg/dL -Blood cultures x2 negative -proBNP >1000, but no swelling      Assessment & Plan:  Sepsis Tricuspid valve MRSA endocarditis Echo shows normal EF, tricuspid vegetation.  MRSA in 3/3 blood cultures.   -Continue daptomycin -Continue doxycycline for pulmonary disease -Weekly CK -Consult infectious disease -MRI spine when able -Oxycodone 7.5 mg q4 and hydromorphone 1 mg q4hrs, discussed taper to Suboxone when initial medical work up complete   Acute renal failure Anasarca Baseline 0.9 mg/dL, admission at 3.8, improved slightly with repeat, BMP this AM pending.  UOP 400 cc yesterday.  FeNA 0.4%, renal US unremarkable.  Hep B Sag neg, HIV neg.  TTP labs pending this morning.  EF good, will continue IV fluids.  Urine 1.4 g protein. -Consult nephrology -Consult to palliative care -Follow hep C, ANCAs, ANA, TTP labs, complements -Trend Cr -Continue IV fluids  Rash From endocarditis.  Pancytopenia Transfused 2 units.  Post-transution H/H pending.    -Trend CBC daily -Follow smear, LDH etc  IVDU If patient stabilizes and prognosis improves, will consult IM re: suboxone  Hyponatremia -Follow pending urine sodium, osms  Hypokalemia -Repeat BMP     DVT prophylaxis: SCds Code Status: FULL Family Communication: None present MDM and disposition Plan: Below labs and imaging  reports were reviewed and summarized above.  Medication changes as above.  Patient was admitted with recent IV drug use, endocarditis with MRSA, sepsis, and renal failure.  Cultures are showing MRSA, ID and nephrology have been consulted.  We are treating with daptomycin for her muscular infection, and doxycycline for septic emboli to the lungs.  She will need imaging of her spine, then likely an extended course of inpatient IV antibiotics.      Consultants:   Nephrology  Palliative Care  Procedures:   Echocardiogram Study Conclusions  - Left ventricle: The cavity size was normal. Systolic function was   vigorous. The estimated ejection fraction was in the range of 65%   to 70%. Wall motion was normal; there were no regional wall   motion abnormalities. - Tricuspid valve: There is a 1.25 x .34 cm mobile vegetation on   the atrial side of the tricuspid valve. There was mild-moderate   regurgitation. - Pulmonary arteries: Systolic pressure was moderately increased.   PA peak pressure: 55 mm Hg (S).  Impressions:  - There was a vegetation, consistent with endocarditis.    Antimicrobials:   Linezolid 6/21 >> 6/22   Cefepime 6/21 >> 6/22  Daptomycin 6/22 >>  Doxycycline 6/22 >>   Subjective: Still severe back pain.  No fever, cough, hemoptysis.  No Confusion, passing out.  Objective: Vitals:   04/08/18 2242 04/09/18 0234 04/09/18 0534 04/09/18 0736  BP:  (!) 83/56    Pulse: (!) 101 (!) 107    Resp: 18 18    Temp:  98.4 F (36.9 C) 98.4 F (36.9 C)  TempSrc:   Oral Oral  SpO2: 97% 93%    Weight:      Height:        Intake/Output Summary (Last 24 hours) at 04/09/2018 1125 Last data filed at 04/08/2018 2230 Gross per 24 hour  Intake 775.58 ml  Output 400 ml  Net 375.58 ml   Filed Weights   04/07/18 2237 04/08/18 0100  Weight: 41.3 kg (91 lb) 44.5 kg (98 lb 1.6 oz)    Examination: General appearance: Thin adult female, lying in bed, no acute  distress, appears tired HEENT: Anicteric, conjunctive are pink, lids and lashes normal.  No nasal deformity, discharge, or epistaxis.  Lips moist, teeth normal, OP dry, no oral lesions.  Hearing normal.   Skin: Skin is warm and dry, no jaundice.  She has a petechial nonblanching rash scattered over the legs.  There are pustules in the center  Cardiac: Tachycardic, regular, no murmurs appreciated by me.  1+ nonpitting edema, pitting edema of the arms, anasarca. Respiratory: Respiratory effort appears normal today, no rales, no wheezes. Abdomen: Abdomen very tender, spleen enlarged. MSK: No deformities or effusions of the large joints of the upper or lower extremities bilaterally.  Pain in her back precludes examination. Neuro:, Extraocular movements intact, moves all extremities, with symmetric strength, but global weakness.  Speech fluent.    Psych: Sensorium intact and responding to questions, intention normal.  Affect blunted, judgment and insight appear normal.    Data Reviewed: I have personally reviewed following labs and imaging studies:  CBC: Recent Labs  Lab 04/07/18 2108 04/08/18 0643  WBC 3.5* 2.8*  NEUTROABS  --  2.4  HGB 8.2* 7.1*  HCT 26.4* 23.0*  MCV 78.8 78.8  PLT 89* 81*   Basic Metabolic Panel: Recent Labs  Lab 04/07/18 2108 04/07/18 2346 04/08/18 0643  NA 130* 130* 132*  K 3.4* 3.5 3.9  CL 93* 96* 100*  CO2 23 22 23   GLUCOSE 121* 111* 117*  BUN 85* 85* 82*  CREATININE 3.81* 3.65* 3.22*  CALCIUM 7.6* 7.5* 7.2*  MG  --   --  1.7   GFR: Estimated Creatinine Clearance: 17.9 mL/min (A) (by C-G formula based on SCr of 3.22 mg/dL (H)). Liver Function Tests: Recent Labs  Lab 04/07/18 2108  AST 18  ALT 12*  ALKPHOS 93  BILITOT 0.6  PROT 7.1  ALBUMIN 2.2*   No results for input(s): LIPASE, AMYLASE in the last 168 hours. No results for input(s): AMMONIA in the last 168 hours. Coagulation Profile: Recent Labs  Lab 04/08/18 0643  INR 1.47   Cardiac  Enzymes: No results for input(s): CKTOTAL, CKMB, CKMBINDEX, TROPONINI in the last 168 hours. BNP (last 3 results) No results for input(s): PROBNP in the last 8760 hours. HbA1C: No results for input(s): HGBA1C in the last 72 hours. CBG: No results for input(s): GLUCAP in the last 168 hours. Lipid Profile: No results for input(s): CHOL, HDL, LDLCALC, TRIG, CHOLHDL, LDLDIRECT in the last 72 hours. Thyroid Function Tests: No results for input(s): TSH, T4TOTAL, FREET4, T3FREE, THYROIDAB in the last 72 hours. Anemia Panel: No results for input(s): VITAMINB12, FOLATE, FERRITIN, TIBC, IRON, RETICCTPCT in the last 72 hours. Urine analysis:    Component Value Date/Time   COLORURINE AMBER (A) 04/07/2018 2116   APPEARANCEUR CLOUDY (A) 04/07/2018 2116   LABSPEC 1.015 04/07/2018 2116   PHURINE 5.0 04/07/2018 2116   GLUCOSEU 50 (A) 04/07/2018 2116   HGBUR LARGE (  A) 04/07/2018 2116   BILIRUBINUR NEGATIVE 04/07/2018 2116   [place] NEGATIVE 04/07/2018 2116   PROTEINUR 100 (A) 04/07/2018 2116   NITRITE NEGATIVE 04/07/2018 2116   LEUKOCYTESUR MODERATE (A) 04/07/2018 2116   Sepsis Labs: @LABRCNTIP (procalcitonin:4,lacticacidven:4)  ) Recent Results (from the past 240 hour(s))  Blood culture (routine x 2)     Status: Abnormal (Preliminary result)   Collection Time: 04/07/18 10:22 PM  Result Value Ref Range Status   Specimen Description BLOOD LEFT FOREARM  Final   Special Requests   Final    BOTTLES DRAWN AEROBIC AND ANAEROBIC Blood Culture results may not be optimal due to an inadequate volume of blood received in culture bottles   Culture  Setup Time   Final    GRAM POSITIVE COCCI IN BOTH AEROBIC AND ANAEROBIC BOTTLES CRITICAL RESULT CALLED TO, READ BACK BY AND VERIFIED WITH: PHARMD E SINCLEAR 04/08/18 AR 1316 BY CM    Culture (A)  Final    STAPHYLOCOCCUS AUREUS SUSCEPTIBILITIES TO FOLLOW Performed at [facility], [place] [address] [place], [place] [address]    Report Status PENDING   Incomplete  Blood Culture ID Panel (Reflexed)     Status: Abnormal   Collection Time: 04/07/18 10:22 PM  Result Value Ref Range Status   Enterococcus species NOT DETECTED NOT DETECTED Final   Listeria monocytogenes NOT DETECTED NOT DETECTED Final   Staphylococcus species DETECTED (A) NOT DETECTED Final    Comment: CRITICAL RESULT CALLED TO, READ BACK BY AND VERIFIED WITH: PHARMD E SINCLEAR 04/08/18 AT 1319 BU CM    Staphylococcus aureus DETECTED (A) NOT DETECTED Final    Comment: Methicillin (oxacillin)-resistant Staphylococcus aureus (MRSA). MRSA is predictably resistant to beta-lactam antibiotics (except ceftaroline). Preferred therapy is vancomycin unless clinically contraindicated. Patient requires contact precautions if  hospitalized. CRITICAL RESULT CALLED TO, READ BACK BY AND VERIFIED WITH: PHARMD E SINCLEAR 04/08/18 AT 1319 BY CM    Methicillin resistance DETECTED (A) NOT DETECTED Final    Comment: CRITICAL RESULT CALLED TO, READ BACK BY AND VERIFIED WITH: PHARMD E SINCLEAR 04/08/18 AT 1319 BY CM    Streptococcus species NOT DETECTED NOT DETECTED Final   Streptococcus agalactiae NOT DETECTED NOT DETECTED Final   Streptococcus pneumoniae NOT DETECTED NOT DETECTED Final   Streptococcus pyogenes NOT DETECTED NOT DETECTED Final   Acinetobacter baumannii NOT DETECTED NOT DETECTED Final   Enterobacteriaceae species NOT DETECTED NOT DETECTED Final   Enterobacter cloacae complex NOT DETECTED NOT DETECTED Final   Escherichia coli NOT DETECTED NOT DETECTED Final   Klebsiella oxytoca NOT DETECTED NOT DETECTED Final   Klebsiella pneumoniae NOT DETECTED NOT DETECTED Final   Proteus species NOT DETECTED NOT DETECTED Final   Serratia marcescens NOT DETECTED NOT DETECTED Final   Haemophilus influenzae NOT DETECTED NOT DETECTED Final   Neisseria meningitidis NOT DETECTED NOT DETECTED Final   Pseudomonas aeruginosa NOT DETECTED NOT DETECTED Final   Candida albicans NOT DETECTED NOT DETECTED  Final   Candida glabrata NOT DETECTED NOT DETECTED Final   Candida krusei NOT DETECTED NOT DETECTED Final   Candida parapsilosis NOT DETECTED NOT DETECTED Final   Candida tropicalis NOT DETECTED NOT DETECTED Final    Comment: Performed at [facility], [place]. [address], [place],  [address]  Blood culture (routine x 2)     Status: Abnormal (Preliminary result)   Collection Time: 04/07/18 10:35 PM  Result Value Ref Range Status   Specimen Description BLOOD RIGHT ANTECUBITAL  Final   Special Requests  Final    BOTTLES DRAWN AEROBIC AND ANAEROBIC Blood Culture results may not be optimal due to an inadequate volume of blood received in culture bottles   Culture  Setup Time   Final    GRAM POSITIVE COCCI IN BOTH AEROBIC AND ANAEROBIC BOTTLES CRITICAL VALUE NOTED.  VALUE IS CONSISTENT WITH PREVIOUSLY REPORTED AND CALLED VALUE. Performed at [facility], [place] [address], [place], [place] [address]    Culture STAPHYLOCOCCUS AUREUS (A)  Final   Report Status PENDING  Incomplete  Culture, blood (single)     Status: Abnormal (Preliminary result)   Collection Time: 04/07/18 11:08 PM  Result Value Ref Range Status   Specimen Description BLOOD RIGHT HAND  Final   Special Requests   Final    BOTTLES DRAWN AEROBIC AND ANAEROBIC Blood Culture results may not be optimal due to an inadequate volume of blood received in culture bottles   Culture  Setup Time   Final    GRAM POSITIVE COCCI ANAEROBIC BOTTLE ONLY CRITICAL VALUE NOTED.  VALUE IS CONSISTENT WITH PREVIOUSLY REPORTED AND CALLED VALUE. Performed at [facility], [place] [address] [place],  [address]    Culture STAPHYLOCOCCUS AUREUS (A)  Final   Report Status PENDING  Incomplete  MRSA PCR Screening     Status: Abnormal   Collection Time: 04/08/18  2:27 AM  Result Value Ref Range Status   MRSA by PCR POSITIVE (A) NEGATIVE Final    Comment:        The GeneXpert MRSA Assay (FDA approved for NASAL specimens only),  is one component of a comprehensive MRSA colonization surveillance program. It is not intended to diagnose MRSA infection nor to guide or monitor treatment for MRSA infections. RESULT CALLED TO, READ BACK BY AND VERIFIED WITHParks Ranger RN 04/08/18 0801 JDW          Radiology Studies: Dg Chest 2 View  Result Date: 04/07/2018 CLINICAL DATA:  Shortness of breath, bilateral lower extremity swelling. EXAM: CHEST - 2 VIEW COMPARISON:  Radiograph and CT scan of Mar 03, 2018. FINDINGS: The heart size and mediastinal contours are within normal limits. No pneumothorax or pleural effusion is noted. Multiple rounded patchy opacities are seen throughout both lungs concerning for multifocal pneumonia. Right basilar subsegmental atelectasis is noted. The visualized skeletal structures are unremarkable. IMPRESSION: Multiple rounded patchy opacities are noted throughout both lungs most consistent with multifocal pneumonia. Mild right basilar subsegmental atelectasis is noted. Electronically Signed   By: Marijo Conception, M.D.   On: 04/07/2018 21:45   Ct Chest Wo Contrast  Result Date: 04/08/2018 CLINICAL DATA:  Shortness of breath. History of IV drug use. Concern for septic emboli based on chest x-ray. EXAM: CT CHEST WITHOUT CONTRAST TECHNIQUE: Multidetector CT imaging of the chest was performed following the standard protocol without IV contrast. COMPARISON:  CTA chest dated Mar 03, 2018. FINDINGS: Cardiovascular: Normal heart size. No pericardial effusion. Normal caliber thoracic aorta. Mediastinum/Nodes: Subcentimeter mediastinal lymph nodes are likely reactive. No enlarged axillary lymph nodes. Thyroid gland, trachea, and esophagus demonstrate no significant findings. Lungs/Pleura: New scattered nodular opacities throughout both lungs, some of which demonstrate cavitation, consistent with septic emboli. The largest nodules measure up to 1.9 cm. More confluent consolidation within the right lower lobe. Focal  bronchiectasis and scarring in the anterior right upper lobe likely related to now resolved pneumonia seen on prior CT. Mild diffuse interlobular septal thickening. Small right greater than left pleural effusions. Mild left lower lobe  subsegmental atelectasis. Upper Abdomen: No acute abnormality. Musculoskeletal: No acute or significant osseous findings. Old right-sided rib fractures. Moderate disc height loss at T5-T6, unchanged. IMPRESSION: 1. New scattered nodular opacities throughout both lungs, some of which demonstrate cavitation, consistent with septic emboli. 2. More confluent consolidation within the right lower lobe may reflect pneumonia versus atelectasis. 3. Small right greater than left pleural effusions. Mild interstitial pulmonary edema. Electronically Signed   By: Titus Dubin M.D.   On: 04/08/2018 19:18   US Renal  Result Date: 04/08/2018 CLINICAL DATA:  Acute renal failure EXAM: RENAL / URINARY TRACT ULTRASOUND COMPLETE COMPARISON:  CT 07/29/2016 FINDINGS: Right Kidney: Length: 10.9 cm. Increased echogenicity. No hydronephrosis. Trace fluid at the lower pole of right kidney. Left Kidney: Unable to be visualized, secondary to bowel gas. Bladder: Appears normal for degree of bladder distention. Markedly enlarged spleen with volume of 1592.9 cubic cm. 1.9 x 2.1 x 3 cm echogenic area within the spleen, likely corresponding to the hypodense area with calcifications noted on comparison CT. IMPRESSION: 1. Echogenic right kidney consistent with medical renal disease. No hydronephrosis 2. Nonvisualized left kidney, likely due to bowel gas 3. Massively enlarged spleen with volume of 152.9 cubic cm. Electronically Signed   By: Donavan Foil M.D.   On: 04/08/2018 19:39        Scheduled Meds: . Chlorhexidine Gluconate Cloth  6 each Topical Q0600  . doxycycline  100 mg Oral Q12H  . mupirocin ointment  1 application Nasal BID  . nicotine  14 mg Transdermal Daily  . potassium chloride  40 mEq  Oral Daily  . sodium chloride flush  3 mL Intravenous Q12H   Continuous Infusions: . sodium chloride    . DAPTOmycin (CUBICIN)  IV 350 mg (04/08/18 1759)     LOS: 1 day    Time spent: 35 minutes    Edwin Dada, MD [facility] 04/09/2018, 11:25 AM     Pager [phone] --- please page though AMION:  [email or web address] If 7PM-7AM, please contact night-coverage

## 2018-04-09 NOTE — Progress Notes (Signed)
[name] Progress Note    Assessment/ Plan:   1.  AKI: differential for AKI multifactorial- hemodynamically mediated, septic emboli to kidney parenchyma, syn-infectious GN which can especially be seen with MRSA.  Less likely I think is TTP (given pancytopenia) which can be seen with IV Opana use (pt has used Opana in the past).  Have ordered LDH/ haptoglobin/ save smear, complements, HIV, hepatitis, ANA, ANCA.  UA with WBC and RBC, has had a longstanding h/o hematuria on UA.  Send UP/C.  Fortunately pt is still making urine and has better renal function after IVFs.  No immediate needs for dialysis.  Renal US with medicorenal disease and splenomegaly.  2.  Severe sepsis secondary to MRSA bacteremia/ TV endocarditis:  TTE with TV vegetation. she has all the stigmata of recurrent endocarditis.  ID following, antibiotics per them.  May need repeat MRI of spine due to severe back pain- refused last night due to pain  3.  Pancytopenia: I suspect due to severe sepsis- will send some TTP workup as above in #1.  4.  Dispo: SDU  Subjective:    Complains of back pain.  Difficult stick- lab has already come by 2x.     Objective:   BP (!) 83/56   Pulse (!) 107   Temp 98.4 F (36.9 C) (Oral)   Resp 18   Ht 5\' 1"  (1.549 m)   Wt 44.5 kg (98 lb 1.6 oz)   SpO2 93%   BMI 18.54 kg/m   Intake/Output Summary (Last 24 hours) at 04/09/2018 1039 Last data filed at 04/08/2018 2230 Gross per 24 hour  Intake 775.58 ml  Output 400 ml  Net 375.58 ml   Weight change:   Physical Exam: GEN chronically ill-appearing, cachectic HEENT EOMI PERRL NECK no JVD PULM normal WOB, some inspiratory crackles throughout CV tachycardic, III/VI systolic murmur RUSB with low swooshing diastolic component ABD soft, distended, mildly diffusely tender EXT 1+ LE edema NEURO AAO x 3, sitting on edge of bed SKIN pale and dry, + mult track marks, + nodular rash bilateral LEs and embolic phenomena  bilateral toes.  MSK no effusions, L hand chronically swollen it appears.  Back with point tenderness over bony thoracic protuberance.   Imaging: Dg Chest 2 View  Result Date: 04/07/2018 CLINICAL DATA:  Shortness of breath, bilateral lower extremity swelling. EXAM: CHEST - 2 VIEW COMPARISON:  Radiograph and CT scan of Mar 03, 2018. FINDINGS: The heart size and mediastinal contours are within normal limits. No pneumothorax or pleural effusion is noted. Multiple rounded patchy opacities are seen throughout both lungs concerning for multifocal pneumonia. Right basilar subsegmental atelectasis is noted. The visualized skeletal structures are unremarkable. IMPRESSION: Multiple rounded patchy opacities are noted throughout both lungs most consistent with multifocal pneumonia. Mild right basilar subsegmental atelectasis is noted. Electronically Signed   By: Marijo Conception, M.D.   On: 04/07/2018 21:45   Ct Chest Wo Contrast  Result Date: 04/08/2018 CLINICAL DATA:  Shortness of breath. History of IV drug use. Concern for septic emboli based on chest x-ray. EXAM: CT CHEST WITHOUT CONTRAST TECHNIQUE: Multidetector CT imaging of the chest was performed following the standard protocol without IV contrast. COMPARISON:  CTA chest dated Mar 03, 2018. FINDINGS: Cardiovascular: Normal heart size. No pericardial effusion. Normal caliber thoracic aorta. Mediastinum/Nodes: Subcentimeter mediastinal lymph nodes are likely reactive. No enlarged axillary lymph nodes. Thyroid gland, trachea, and esophagus demonstrate no significant findings. Lungs/Pleura: New scattered nodular opacities throughout both lungs, some  of which demonstrate cavitation, consistent with septic emboli. The largest nodules measure up to 1.9 cm. More confluent consolidation within the right lower lobe. Focal bronchiectasis and scarring in the anterior right upper lobe likely related to now resolved pneumonia seen on prior CT. Mild diffuse interlobular  septal thickening. Small right greater than left pleural effusions. Mild left lower lobe subsegmental atelectasis. Upper Abdomen: No acute abnormality. Musculoskeletal: No acute or significant osseous findings. Old right-sided rib fractures. Moderate disc height loss at T5-T6, unchanged. IMPRESSION: 1. New scattered nodular opacities throughout both lungs, some of which demonstrate cavitation, consistent with septic emboli. 2. More confluent consolidation within the right lower lobe may reflect pneumonia versus atelectasis. 3. Small right greater than left pleural effusions. Mild interstitial pulmonary edema. Electronically Signed   By: Titus Dubin M.D.   On: 04/08/2018 19:18   US Renal  Result Date: 04/08/2018 CLINICAL DATA:  Acute renal failure EXAM: RENAL / URINARY TRACT ULTRASOUND COMPLETE COMPARISON:  CT 07/29/2016 FINDINGS: Right Kidney: Length: 10.9 cm. Increased echogenicity. No hydronephrosis. Trace fluid at the lower pole of right kidney. Left Kidney: Unable to be visualized, secondary to bowel gas. Bladder: Appears normal for degree of bladder distention. Markedly enlarged spleen with volume of 1592.9 cubic cm. 1.9 x 2.1 x 3 cm echogenic area within the spleen, likely corresponding to the hypodense area with calcifications noted on comparison CT. IMPRESSION: 1. Echogenic right kidney consistent with medical renal disease. No hydronephrosis 2. Nonvisualized left kidney, likely due to bowel gas 3. Massively enlarged spleen with volume of 152.9 cubic cm. Electronically Signed   By: Donavan Foil M.D.   On: 04/08/2018 19:39    Labs: BMET Recent Labs  Lab 04/07/18 2108 04/07/18 2346 04/08/18 0643  NA 130* 130* 132*  K 3.4* 3.5 3.9  CL 93* 96* 100*  CO2 23 22 23   GLUCOSE 121* 111* 117*  BUN 85* 85* 82*  CREATININE 3.81* 3.65* 3.22*  CALCIUM 7.6* 7.5* 7.2*   CBC Recent Labs  Lab 04/07/18 2108 04/08/18 0643  WBC 3.5* 2.8*  NEUTROABS  --  2.4  HGB 8.2* 7.1*  HCT 26.4* 23.0*  MCV  78.8 78.8  PLT 89* 81*    Medications:    . Chlorhexidine Gluconate Cloth  6 each Topical Q0600  . doxycycline  100 mg Oral Q12H  . mupirocin ointment  1 application Nasal BID  . nicotine  14 mg Transdermal Daily  . potassium chloride  40 mEq Oral Daily  . sodium chloride flush  3 mL Intravenous Q12H     Madelon Lips MD 04/09/2018, 10:39 AM

## 2018-04-09 NOTE — Progress Notes (Signed)
PHARMACY NOTE:  ANTIMICROBIAL RENAL DOSAGE ADJUSTMENT  Current antimicrobial dosage:  Daptomycin 8mg /kg Q24hrs  Indication: MRSA Bacteremia, endocarditis, concern for discitis  Renal Function:   Estimated Creatinine Clearance: 18.2 mL/min (A) (by C-G formula based on SCr of 3.18 mg/dL (H)). []      On intermittent HD, scheduled: []      On CRRT    Antimicrobial dosage has been changed to:  Daptomycin 8mg /kg Q48hrs  Additional comments: Will continue to monitor renal function and adjust as needed   Patterson Hammersmith PharmD PGY1 Acute Care Pharmacy Resident 04/09/2018 1:32 PM Phone: [phone] until 3:30 then check AMION

## 2018-04-09 NOTE — Consult Note (Addendum)
Consultation Note Date: 04/09/2018   Patient Name: Molly [name]  DOB: 1986-02-16  MRN: [id number]  Age / Sex: 32 y.o., female  PCP: Patient, No Pcp Per Referring Physician: Edwin Dada, *  Reason for Consultation: Establishing goals of care and Psychosocial/spiritual support  HPI/Patient Profile: 32 y.o. female  with past medical history of opioid use disorder, IV drug use, MRSA endocarditis, splenic infarct, septic pulmonary emboli, thoracic osteo-myelitis, hepatitis C, admitted on 04/07/2018 with either, chest pain, shortness of breath, petechial rash to lower extremities.  Chest x-ray revealed multifocal pneumonia..  Chest CT shows new nodular bilateral opacities with cavitation consistent with septic emboli, right lower lobe pneumonia.  Patient has been seen and treated at [facility] in the past and has unfortunately left the hospital AMA and not completed full course of antibiotics for diagnosis of endocarditis in the past.  She has been seen by the palliative medicine team in 2017 for pain management.   Consult ordered for goals of care specifically hemodialysis  Clinical Assessment and Goals of Care: Patient seen, chart reviewed.  Patient was in too much distress to speak but deferred to her mother, Mazy Culton who is been had a bedside.  Ms. Kenan shares that she understands her daughter's heart condition is worse and that she is critically ill.  She shares with me her daughter has been dealing with addiction for the past 8 years.  She has had prior admissions for endocarditis but unfortunately would leave AMA without completing a full course of treatment.  Patient's mother shares at this point daughter has gotten so weak in his hands so much pain with her back that she is unable to stand or walk unassisted.   Patient unfortunately has not been able to attend rehab for any length of time either  secondary to no insurance or being too ill to be excepted into addiction rehab program.  Patient at this point can participate in goals of care discussion however she is in quite a bit of distress both physically as well as psychologically.  She comes into the hospital after a recent relapse.  I began discussions with mother regarding goals of care.  Patient does not have children and her mother would be her healthcare proxy in the event that she were unable to speak for herself.  She is trying to balance hopefulness as well as being realistic regarding the acuity of her daughter's condition    SUMMARY OF RECOMMENDATIONS   Full scope of treatment for now Introduced topic of goals of care broadly, but specifically hemodialysis Patient's grandmother was on hemodialysis and patient's mother exhibits good insight into the difficulty that hemodialysis would pose but also that pt ma be too ill to undergo HD if needed Palliative medicine to stay involved and help support patient and family as we see how she responds to treatment modalities, and her further clinical needs Hard Choices for [employer] booklet as well as MOST form given to mother Code Status/Advance Care Planning:  Full code  Symptom Management:   Pain: Continue with Dilaudid 1 mg IV every 4 hours as needed.  Monitor for need for scheduled dosing.  Oxycodone 7.5 mg every 4 hours as needed available as well  Palliative Prophylaxis:   Bowel Regimen, Delirium Protocol, Frequent Pain Assessment, Oral Care and Turn Reposition  Additional Recommendations (Limitations, Scope, Preferences):  Full Scope Treatment  Psycho-social/Spiritual:   Desire for further Chaplaincy support:no  Additional Recommendations: Grief/Bereavement Support  Prognosis:   Unable to determine  Discharge Planning: To Be Determined      Primary Diagnoses: Present on Admission: . Pancytopenia ([place]) . Intravenous drug abuse, continuous ([place]) .  AKI (acute kidney injury) ([place]) . Septic pulmonary embolism ([place]) . Severe sepsis ([place]) . Hyponatremia   I have reviewed the medical record, interviewed the patient and family, and examined the patient. The following aspects are pertinent.  Past Medical History:  Diagnosis Date  . Abscess of skin    buttock - most recently 2009/10  . Drug-seeking behavior   . IV drug user   . MRSA (methicillin resistant Staphylococcus aureus) infection    Social History   Socioeconomic History  . Marital status: Single    Spouse name: Not on file  . Number of children: Not on file  . Years of education: Not on file  . Highest education level: Not on file  Occupational History  . Not on file  Social Needs  . Financial resource strain: Not on file  . Food insecurity:    Worry: Not on file    Inability: Not on file  . Transportation needs:    Medical: Not on file    Non-medical: Not on file  Tobacco Use  . Smoking status: Current Every Day Smoker    Packs/day: 0.50    Years: 7.00    Pack years: 3.50    Types: Cigarettes  . Smokeless tobacco: Never Used  . Tobacco comment: slowing down  Substance and Sexual Activity  . Alcohol use: No  . Drug use: Yes    Types: IV, Oxycodone    Comment: heroin  . Sexual activity: Yes    Partners: Male    Birth control/protection: IUD  Lifestyle  . Physical activity:    Days per week: Not on file    Minutes per session: Not on file  . Stress: Not on file  Relationships  . Social connections:    Talks on phone: Not on file    Gets together: Not on file    Attends religious service: Not on file    Active member of club or organization: Not on file    Attends meetings of clubs or organizations: Not on file    Relationship status: Not on file  Other Topics Concern  . Not on file  Social History Narrative  . Not on file   Family History  Problem Relation Age of Onset  . Alcoholism Father    Scheduled Meds: . Chlorhexidine Gluconate Cloth   6 each Topical Q0600  . doxycycline  100 mg Oral Q12H  . mupirocin ointment  1 application Nasal BID  . nicotine  14 mg Transdermal Daily  . potassium chloride  40 mEq Oral Daily  . sodium chloride flush  3 mL Intravenous Q12H   Continuous Infusions: . sodium chloride 10 mL/hr at 04/09/18 1344  . [START ON 04/10/2018] DAPTOmycin (CUBICIN)  IV     PRN Meds:.sodium chloride, acetaminophen **OR** acetaminophen, alum & mag hydroxide-simeth, bisacodyl, diphenhydrAMINE, HYDROmorphone (DILAUDID) injection, ondansetron **  OR** ondansetron (ZOFRAN) IV, oxyCODONE, senna-docusate Medications Prior to Admission:  Prior to Admission medications   Medication Sig Start Date End Date Taking? Authorizing Provider  ibuprofen (ADVIL,MOTRIN) 200 MG tablet Take 400 mg by mouth every 6 (six) hours as needed for moderate pain.    Yes [provider]  senna-docusate (SENOKOT-S) 8.6-50 MG tablet Take 1 tablet by mouth at bedtime as needed for mild constipation. Patient not taking: Reported on 07/12/2017 07/21/16   Florinda Marker, MD   Allergies  Allergen Reactions  . Vancomycin Other (See Comments)    Possible contributor to AKI and thrombocytopenia   Review of Systems  Unable to perform ROS: Acuity of condition    Physical Exam  Constitutional: She is oriented to person, place, and time.  Ill appearing young female; appears uncomfortable   HENT:  Head: Normocephalic and atraumatic.  Neck: Normal range of motion.  Pulmonary/Chest: Effort normal.  Musculoskeletal:  Scoliosis  Neurological: She is alert and oriented to person, place, and time.  Skin: Skin is warm and dry.  Psychiatric:  Anxious, irritable Appears to be in distress  Nursing note and vitals reviewed.   Vital Signs: BP 99/76 (BP Location: Left Arm)   Pulse 97   Temp 98.9 F (37.2 C) (Oral)   Resp 18   Ht 5\' 1"  (1.549 m)   Wt 44.5 kg (98 lb 1.6 oz)   SpO2 95%   BMI 18.54 kg/m  Pain Scale: 0-10   Pain Score: 7     SpO2: SpO2: 95 % O2 Device:SpO2: 95 % O2 Flow Rate: .   IO: Intake/output summary:   Intake/Output Summary (Last 24 hours) at 04/09/2018 1523 Last data filed at 04/09/2018 1344 Gross per 24 hour  Intake 546.44 ml  Output 400 ml  Net 146.44 ml    LBM: Last BM Date: 04/06/18 Baseline Weight: Weight: 41.3 kg (91 lb) Most recent weight: Weight: 44.5 kg (98 lb 1.6 oz)     Palliative Assessment/Data:   Flowsheet Rows     Most Recent Value  Intake Tab  Referral Department  Hospitalist  Unit at Time of Referral  Med/Surg Unit  Palliative Care Primary Diagnosis  Sepsis/Infectious Disease  Date Notified  04/08/18  Reason for referral  Clarify Goals of Care  Date of Admission  04/07/18  Date first seen by Palliative Care  04/09/18  # of days Palliative referral response time  1 Day(s)  # of days IP prior to Palliative referral  1  Clinical Assessment  Palliative Performance Scale Score  40%  Pain Max last 24 hours  Not able to report  Pain Min Last 24 hours  Not able to report  Dyspnea Max Last 24 Hours  Not able to report  Dyspnea Min Last 24 hours  Not able to report  Nausea Max Last 24 Hours  Not able to report  Nausea Min Last 24 Hours  Not able to report  Anxiety Max Last 24 Hours  Not able to report  Anxiety Min Last 24 Hours  Not able to report  Other Max Last 24 Hours  Not able to report  Psychosocial & Spiritual Assessment  Palliative Care Outcomes  Patient/Family meeting held?  Yes  Who was at the meeting?  pt's mother  Palliative Care Outcomes  Provided psychosocial or spiritual support      Time In: 1415 Time Out: 1530 Time Total: 75 min Greater than 50%  of this time was spent counseling and coordinating care related  to the above assessment and plan.  Signed by: Dory Horn, NP   Please contact Palliative Medicine Team phone at [phone] for questions and concerns.  For individual provider: See Shea Evans

## 2018-04-10 DIAGNOSIS — F1910 Other psychoactive substance abuse, uncomplicated: Secondary | ICD-10-CM

## 2018-04-10 DIAGNOSIS — I368 Other nonrheumatic tricuspid valve disorders: Secondary | ICD-10-CM

## 2018-04-10 DIAGNOSIS — I071 Rheumatic tricuspid insufficiency: Secondary | ICD-10-CM

## 2018-04-10 DIAGNOSIS — M4640 Discitis, unspecified, site unspecified: Secondary | ICD-10-CM

## 2018-04-10 DIAGNOSIS — G8929 Other chronic pain: Secondary | ICD-10-CM

## 2018-04-10 DIAGNOSIS — N179 Acute kidney failure, unspecified: Secondary | ICD-10-CM

## 2018-04-10 DIAGNOSIS — M546 Pain in thoracic spine: Secondary | ICD-10-CM

## 2018-04-10 DIAGNOSIS — I330 Acute and subacute infective endocarditis: Secondary | ICD-10-CM

## 2018-04-10 LAB — CULTURE, BLOOD (ROUTINE X 2)

## 2018-04-10 LAB — CULTURE, BLOOD (SINGLE)

## 2018-04-10 LAB — CBC
HEMATOCRIT: 29.8 % — AB (ref 36.0–46.0)
HEMOGLOBIN: 9.5 g/dL — AB (ref 12.0–15.0)
MCH: 26.5 pg (ref 26.0–34.0)
MCHC: 31.9 g/dL (ref 30.0–36.0)
MCV: 83 fL (ref 78.0–100.0)
Platelets: 108 10*3/uL — ABNORMAL LOW (ref 150–400)
RBC: 3.59 MIL/uL — ABNORMAL LOW (ref 3.87–5.11)
RDW: 17.8 % — ABNORMAL HIGH (ref 11.5–15.5)
WBC: 4.8 10*3/uL (ref 4.0–10.5)

## 2018-04-10 LAB — HAPTOGLOBIN: HAPTOGLOBIN: 175 mg/dL (ref 34–200)

## 2018-04-10 LAB — C3 COMPLEMENT: C3 COMPLEMENT: 10 mg/dL — AB (ref 82–167)

## 2018-04-10 LAB — C4 COMPLEMENT: COMPLEMENT C4, BODY FLUID: 8 mg/dL — AB (ref 14–44)

## 2018-04-10 MED ORDER — FUROSEMIDE 10 MG/ML IJ SOLN: 20.0000 mg | Freq: Once | INTRAMUSCULAR | Status: DC

## 2018-04-10 MED ORDER — SODIUM CHLORIDE 0.9 % IV SOLN
400.0000 mg | Freq: Two times a day (BID) | INTRAVENOUS | Status: DC
  Administered 2018-04-11 – 2018-04-12 (×3): 400 mg via INTRAVENOUS
  Filled 2018-04-10 (×5): qty 400

## 2018-04-10 MED ORDER — SODIUM CHLORIDE 0.9 % IV SOLN
300.0000 mg | Freq: Two times a day (BID) | INTRAVENOUS | Status: AC
  Administered 2018-04-10: 300 mg via INTRAVENOUS
  Filled 2018-04-10 (×2): qty 300

## 2018-04-10 MED ORDER — HYDROMORPHONE HCL 1 MG/ML IJ SOLN
2.0000 mg | INTRAMUSCULAR | Status: DC | PRN
  Administered 2018-04-10 – 2018-04-11 (×5): 2 mg via INTRAVENOUS
  Filled 2018-04-10 (×5): qty 2

## 2018-04-10 MED ORDER — OXYCODONE HCL 5 MG PO TABS: 10.0000 mg | ORAL_TABLET | ORAL | Status: DC | PRN

## 2018-04-10 NOTE — Progress Notes (Signed)
Patient is tolerating her increased dose of Dilaudid.  She is wearing the capnography Cameron and resting.

## 2018-04-10 NOTE — Progress Notes (Signed)
[facility] NEPHROLOGY PROGRESS NOTE  Assessment/ Plan: Pt is a 32 y.o. yo female  with history of IV drug abuse, hepatitis C antibody positive, severe MRSA bacteremia, endocarditis with septic emboli presented with chest, back pain.  Consulted for acute kidney injury.  Assessment/Plan:  # Acute kidney injury, non-oliguric multifactorial etiology including septic emboli, syn-infectious GN related with MRSA, hemodynamically likely mediated. Less likely TTP as Haptoglobin, LDH unremarkable.  C3-C4 level depressed.  Follow-up ANCA, ANA antibody. -UA consistent with large WBC and RBCs.  Serum creatinine level trending down.  Off IV fluid.  Renal ultrasound with medical renal disease.  No need for dialysis now. -Holding diuretics.  I will check BNP and chest x-ray in the morning.  She does not look grossly fluid overload on physical exam.  #Severe sepsis secondary due to MRSA bacteremia/TV endocarditis patient with vegetation.  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  She is getting MRI of the spine for severe back pain.  On IV antibiotics per ID.  # Pancytopenia due to severe sepsis.  Subjective: Seen and examined at bedside.  Reported generalized body pain.  No nausea vomiting.  No urinary complaints. Objective Vital signs in last 24 hours: Vitals:   04/10/18 1300 04/10/18 1400 04/10/18 1430 04/10/18 1500  BP:   116/79   Pulse: (!) 101 99 98 (!) 102  Resp: 20 (!) 29 (!) 23 (!) 33  Temp:   98.5 F (36.9 C)   TempSrc:      SpO2: 97% 98% 97% 96%  Weight:      Height:       Weight change:   Intake/Output Summary (Last 24 hours) at 04/10/2018 1739 Last data filed at 04/10/2018 1000 Gross per 24 hour  Intake 0.17 ml  Output -  Net 0.17 ml       Labs: Basic Metabolic Panel: Recent Labs  Lab 04/07/18 2346 04/08/18 0643 04/09/18 1102  NA 130* 132* 134*  K 3.5 3.9 3.8  CL 96* 100* 103  CO2 22 23 23   GLUCOSE 111* 117* 124*  BUN 85* 82* 77*   CREATININE 3.65* 3.22* 3.18*  CALCIUM 7.5* 7.2* 7.6*   Liver Function Tests: Recent Labs  Lab 04/07/18 2108 04/09/18 1102  AST 18 14*  ALT 12* 10*  ALKPHOS 93 86  BILITOT 0.6 0.7  PROT 7.1 6.0*  ALBUMIN 2.2* 1.5*   No results for input(s): LIPASE, AMYLASE in the last 168 hours. No results for input(s): AMMONIA in the last 168 hours. CBC: Recent Labs  Lab 04/07/18 2108 04/08/18 0643 04/09/18 1102  WBC 3.5* 2.8* 4.8  NEUTROABS  --  2.4  --   HGB 8.2* 7.1* 9.5*  HCT 26.4* 23.0* 29.8*  MCV 78.8 78.8 83.0  PLT 89* 81* 108*   Cardiac Enzymes: Recent Labs  Lab 04/09/18 1102  CKTOTAL 17*   CBG: No results for input(s): GLUCAP in the last 168 hours.  Iron Studies: No results for input(s): IRON, TIBC, TRANSFERRIN, FERRITIN in the last 72 hours. Studies/Results: US Renal  Result Date: 04/08/2018 CLINICAL DATA:  Acute renal failure EXAM: RENAL / URINARY TRACT ULTRASOUND COMPLETE COMPARISON:  CT 07/29/2016 FINDINGS: Right Kidney: Length: 10.9 cm. Increased echogenicity. No hydronephrosis. Trace fluid at the lower pole of right kidney. Left Kidney: Unable to be visualized, secondary to bowel gas. Bladder: Appears normal for degree of bladder distention. Markedly enlarged spleen with volume of 1592.9 cubic cm. 1.9 x 2.1 x 3 cm echogenic area within the  spleen, likely corresponding to the hypodense area with calcifications noted on comparison CT. IMPRESSION: 1. Echogenic right kidney consistent with medical renal disease. No hydronephrosis 2. Nonvisualized left kidney, likely due to bowel gas 3. Massively enlarged spleen with volume of 152.9 cubic cm. Electronically Signed   By: Donavan Foil M.D.   On: 04/08/2018 19:39    Medications: Infusions: . sodium chloride Stopped (04/09/18 1344)  . [START ON 04/11/2018] ceFTAROline (TEFLARO) IV      Scheduled Medications: . Chlorhexidine Gluconate Cloth  6 each Topical Q0600  . mupirocin ointment  1 application Nasal BID  . nicotine   14 mg Transdermal Daily  . potassium chloride  40 mEq Oral Daily  . sodium chloride flush  3 mL Intravenous Q12H    have reviewed scheduled and prn medications.  Physical Exam: General:NAD, lying on bed Heart:RRR, s1s2 nl Lungs:clear b/l, no cracjle Abdomen:soft, Non-tender, non-distended Extremities:No edema  Dron Tanna Furry 04/10/2018,5:39 PM  LOS: 2 days

## 2018-04-10 NOTE — Progress Notes (Signed)
Patient asked if she would do her MRI after pain medication and she was very irritable and said not now.

## 2018-04-10 NOTE — Progress Notes (Signed)
[facility]  Date of Admission:  04/07/2018     Total days of antibiotics 4         ASSESSMENT/PLAN  Molly [name] is a 32 y/o female with previous medical history of IVDU, aortic valve endocarditis, thoracic discitis who developed new back pain and now with MRSA bacteremia, TV endocarditis, and likely discitis. She continues to have back pain and is awaiting her MRI of thoracic and lumbar spine which may require general anesthesia. She is currently on Day 4 of antimicrobial therapy with doxycycline and daptomycin. She is tolerating the antibiotics with no adverse side effects.   MRSA bacteremia / TV endocarditis - Chest x-ray with likely septic emboli. Will discontinue doxycycline and Daptomycin and start Teflaro. Will likely require protracted therapy of at least 8 weeks followed by suppressive therapy. Repeat blood cultures today.   Back pain / MRSA Discitis - Likely source is worsening discitis. Pain management per primary team. Has had Suboxone in the past.  AKI - Creatinine slowly improving. Baseline 0.8-1.1. Nephrology is following.    Principal Problem:   Severe sepsis ([place]) Active Problems:   Septic pulmonary embolism (HCC)   Intravenous drug abuse, continuous (HCC)   Pancytopenia (HCC)   AKI (acute kidney injury) ([place])   Goals of care, counseling/discussion   Hyponatremia   Acute bacterial endocarditis   Chronic thoracic back pain   Palliative care by specialist   . Chlorhexidine Gluconate Cloth  6 each Topical Q0600  . doxycycline  100 mg Oral Q12H  . mupirocin ointment  1 application Nasal BID  . nicotine  14 mg Transdermal Daily  . potassium chloride  40 mEq Oral Daily  . sodium chloride flush  3 mL Intravenous Q12H    SUBJECTIVE:  Afebrile overnight with no leukocytosis. Continuing to await MRI of thoracic and lumbar spines. Evaluated by cardiology with recommendation to continue with medication at this time. She continues to have low  back pain.   Allergies  Allergen Reactions  . Vancomycin Other (See Comments)    Possible contributor to AKI and thrombocytopenia     Review of Systems: Review of Systems  Constitutional: Negative for chills and fever.  Respiratory: Negative for cough, shortness of breath and wheezing.   Cardiovascular: Negative for chest pain and leg swelling.  Gastrointestinal: Negative for constipation, diarrhea, nausea and vomiting.  Musculoskeletal: Positive for back pain.      OBJECTIVE: Vitals:   04/10/18 0500 04/10/18 0900 04/10/18 1000 04/10/18 1100  BP:      Pulse:  (!) 103 (!) 102 97  Resp:  (!) 26 (!) 28 (!) 26  Temp:      TempSrc:      SpO2:  96% 94% 95%  Weight: 110 lb 0.2 oz (49.9 kg)     Height:       Body mass index is 20.79 kg/m.  Physical Exam  Constitutional: She is oriented to person, place, and time. No distress.  Cardiovascular: Normal rate, regular rhythm and intact distal pulses. Exam reveals no friction rub.  Murmur heard. Pulmonary/Chest: Effort normal. No stridor. No respiratory distress. She has no wheezes. She exhibits no tenderness.  Abdominal: Soft. Bowel sounds are normal.  Musculoskeletal:  Low back with no obvious deformity, discoloration or edema. There is generalized lumbar tenderness. Distal pulses and sensation are intact and appropriate.   Neurological: She is alert and oriented to person, place, and time.  Skin: Skin is warm and dry.  Psychiatric: She  has a normal mood and affect. Her behavior is normal. Judgment and thought content normal.    Lab Results Lab Results  Component Value Date   WBC 4.8 04/09/2018   HGB 9.5 (L) 04/09/2018   HCT 29.8 (L) 04/09/2018   MCV 83.0 04/09/2018   PLT 108 (L) 04/09/2018    Lab Results  Component Value Date   CREATININE 3.18 (H) 04/09/2018   BUN 77 (H) 04/09/2018   NA 134 (L) 04/09/2018   K 3.8 04/09/2018   CL 103 04/09/2018   CO2 23 04/09/2018    Lab Results  Component Value Date   ALT 10  (L) 04/09/2018   AST 14 (L) 04/09/2018   ALKPHOS 86 04/09/2018   BILITOT 0.7 04/09/2018     Microbiology: Recent Results (from the past 240 hour(s))  Blood culture (routine x 2)     Status: Abnormal   Collection Time: 04/07/18 10:22 PM  Result Value Ref Range Status   Specimen Description BLOOD LEFT FOREARM  Final   Special Requests   Final    BOTTLES DRAWN AEROBIC AND ANAEROBIC Blood Culture results may not be optimal due to an inadequate volume of blood received in culture bottles   Culture  Setup Time   Final    GRAM POSITIVE COCCI IN BOTH AEROBIC AND ANAEROBIC BOTTLES CRITICAL RESULT CALLED TO, READ BACK BY AND VERIFIED WITH: PHARMD E [place] 04/08/18 AR 1316 BY CM Performed at [facility], [place] [address], [place], [place] [address]    Culture METHICILLIN RESISTANT STAPHYLOCOCCUS AUREUS (A)  Final   Report Status 04/10/2018 FINAL  Final   Organism ID, Bacteria METHICILLIN RESISTANT STAPHYLOCOCCUS AUREUS  Final      Susceptibility   Methicillin resistant staphylococcus aureus - MIC*    CIPROFLOXACIN <=0.5 SENSITIVE Sensitive     ERYTHROMYCIN >=8 RESISTANT Resistant     GENTAMICIN <=0.5 SENSITIVE Sensitive     OXACILLIN >=4 RESISTANT Resistant     TETRACYCLINE <=1 SENSITIVE Sensitive     VANCOMYCIN 1 SENSITIVE Sensitive     TRIMETH/SULFA <=10 SENSITIVE Sensitive     CLINDAMYCIN <=0.25 SENSITIVE Sensitive     RIFAMPIN <=0.5 SENSITIVE Sensitive     Inducible Clindamycin NEGATIVE Sensitive     * METHICILLIN RESISTANT STAPHYLOCOCCUS AUREUS  Blood Culture ID Panel (Reflexed)     Status: Abnormal   Collection Time: 04/07/18 10:22 PM  Result Value Ref Range Status   Enterococcus species NOT DETECTED NOT DETECTED Final   Listeria monocytogenes NOT DETECTED NOT DETECTED Final   Staphylococcus species DETECTED (A) NOT DETECTED Final    Comment: CRITICAL RESULT CALLED TO, READ BACK BY AND VERIFIED WITH: PHARMD E SINCLEAR 04/08/18 AT 1319 BU CM    Staphylococcus aureus  DETECTED (A) NOT DETECTED Final    Comment: Methicillin (oxacillin)-resistant Staphylococcus aureus (MRSA). MRSA is predictably resistant to beta-lactam antibiotics (except ceftaroline). Preferred therapy is vancomycin unless clinically contraindicated. Patient requires contact precautions if  hospitalized. CRITICAL RESULT CALLED TO, READ BACK BY AND VERIFIED WITH: PHARMD E SINCLEAR 04/08/18 AT 1319 BY CM    Methicillin resistance DETECTED (A) NOT DETECTED Final    Comment: CRITICAL RESULT CALLED TO, READ BACK BY AND VERIFIED WITH: PHARMD E SINCLEAR 04/08/18 AT 1319 BY CM    Streptococcus species NOT DETECTED NOT DETECTED Final   Streptococcus agalactiae NOT DETECTED NOT DETECTED Final   Streptococcus pneumoniae NOT DETECTED NOT DETECTED Final   Streptococcus pyogenes NOT DETECTED NOT DETECTED Final   Acinetobacter baumannii NOT  DETECTED NOT DETECTED Final   Enterobacteriaceae species NOT DETECTED NOT DETECTED Final   Enterobacter cloacae complex NOT DETECTED NOT DETECTED Final   Escherichia coli NOT DETECTED NOT DETECTED Final   Klebsiella oxytoca NOT DETECTED NOT DETECTED Final   Klebsiella pneumoniae NOT DETECTED NOT DETECTED Final   Proteus species NOT DETECTED NOT DETECTED Final   Serratia marcescens NOT DETECTED NOT DETECTED Final   Haemophilus influenzae NOT DETECTED NOT DETECTED Final   Neisseria meningitidis NOT DETECTED NOT DETECTED Final   Pseudomonas aeruginosa NOT DETECTED NOT DETECTED Final   Candida albicans NOT DETECTED NOT DETECTED Final   Candida glabrata NOT DETECTED NOT DETECTED Final   Candida krusei NOT DETECTED NOT DETECTED Final   Candida parapsilosis NOT DETECTED NOT DETECTED Final   Candida tropicalis NOT DETECTED NOT DETECTED Final    Comment: Performed at [facility], [place] [address], [place], [place] [address]  Blood culture (routine x 2)     Status: Abnormal   Collection Time: 04/07/18 10:35 PM  Result Value Ref Range Status   Specimen Description  BLOOD RIGHT ANTECUBITAL  Final   Special Requests   Final    BOTTLES DRAWN AEROBIC AND ANAEROBIC Blood Culture results may not be optimal due to an inadequate volume of blood received in culture bottles   Culture  Setup Time   Final    GRAM POSITIVE COCCI IN BOTH AEROBIC AND ANAEROBIC BOTTLES CRITICAL VALUE NOTED.  VALUE IS CONSISTENT WITH PREVIOUSLY REPORTED AND CALLED VALUE.    Culture (A)  Final    STAPHYLOCOCCUS AUREUS SUSCEPTIBILITIES PERFORMED ON PREVIOUS CULTURE WITHIN THE LAST 5 DAYS. Performed at [facility], [place] [address], [place], [place] [address]    Report Status 04/10/2018 FINAL  Final  Culture, blood (single)     Status: Abnormal   Collection Time: 04/07/18 11:08 PM  Result Value Ref Range Status   Specimen Description BLOOD RIGHT HAND  Final   Special Requests   Final    BOTTLES DRAWN AEROBIC AND ANAEROBIC Blood Culture results may not be optimal due to an inadequate volume of blood received in culture bottles   Culture  Setup Time   Final    GRAM POSITIVE COCCI IN BOTH AEROBIC AND ANAEROBIC BOTTLES CRITICAL VALUE NOTED.  VALUE IS CONSISTENT WITH PREVIOUSLY REPORTED AND CALLED VALUE.    Culture (A)  Final    STAPHYLOCOCCUS AUREUS SUSCEPTIBILITIES PERFORMED ON PREVIOUS CULTURE WITHIN THE LAST 5 DAYS. Performed at [facility], [place] [address], [place], [place] [address]    Report Status 04/10/2018 FINAL  Final  MRSA PCR Screening     Status: Abnormal   Collection Time: 04/08/18  2:27 AM  Result Value Ref Range Status   MRSA by PCR POSITIVE (A) NEGATIVE Final    Comment:        The GeneXpert MRSA Assay (FDA approved for NASAL specimens only), is one component of a comprehensive MRSA colonization surveillance program. It is not intended to diagnose MRSA infection nor to guide or monitor treatment for MRSA infections. RESULT CALLED TO, READ BACK BY AND VERIFIED WITHParks Ranger RN 04/08/18 0801 JDW      Terri Piedra, NP [facility] [phone] Pager  04/10/2018  1:21 PM

## 2018-04-10 NOTE — Consult Note (Addendum)
Cardiology Consultation:   Patient ID: Molly [name]; [id number]; 03-22-1986   Admit date: 04/07/2018 Date of Consult: 04/10/2018  Primary Care Provider: Patient, No Pcp Per Primary Cardiologist: New to [facility], Dr. Johnsie Cancel Primary Electrophysiologist:  None   Patient Profile:   Molly [name] is a 32 y.o. female with a PMH of IV drug abuse, history of MRSA bacteremia with AV endocarditis, splenic infarction, septic pulmonary emboli, and thoracic osteomyelitis, hepatitis C Ab+, and bipolar disorder, who is being seen today for the evaluation of endocarditis at the request of Dr. Loleta Books.  History of Present Illness:   Molly [name] has a history of MRSA bacteremia with AV endocarditis, antibiotics course complicated due to patient leaving the hospital AMA multiple times. She was recommended to continue suppressive doxycycline, however she discontinued this medication in August of 2018. She was last seen by infectious disease outpatient 11/2017 and unfortunately had relapsed and was using IV drugs again. She returned to the ED 04/07/18 with complaints of fever, chest discomfort, SOB, LE swelling, and a rash.   She was found to have MRSA bacteremia this admission. Echo revealed a vegetation on her tricuspid valve. She is on IV daptomycin and doxycycline per ID recommendations. Her hospital course has been complicated by AKI with Cr peak of 3.18, for which nephrology is following. Also with pancytopenia requiring transfusion of 2 uPRBC. Cardiology asked to evaluation patient for tricuspid valve endocarditis.   She continues to feel poorly with a primary complaint of back pain at the time of this evaluation. She reports ongoing chest pain, SOB, abdominal pain, and orthopnea. Patient reports that she last used IV drugs (heroin) on Thursday.   Past Medical History:  Diagnosis Date  . Abscess of skin    buttock - most recently 2009/10  . Drug-seeking behavior   . IV drug user   . MRSA  (methicillin resistant Staphylococcus aureus) infection     Past Surgical History:  Procedure Laterality Date  . IR GENERIC HISTORICAL  07/19/2016   IR LUMBAR DISC ASPIRATION W/IMG GUIDE 07/19/2016 Arne Cleveland, MD [facility]  . TEE WITHOUT CARDIOVERSION N/A 07/04/2013   Procedure: TRANSESOPHAGEAL ECHOCARDIOGRAM (TEE);  Surgeon: Larey Dresser, MD;  Location: [facility] ENDOSCOPY;  Service: Cardiovascular;  Laterality: N/A;  . TONSILLECTOMY     32 years old     Home Medications:  Prior to Admission medications   Medication Sig Start Date End Date Taking? Authorizing Provider  ibuprofen (ADVIL,MOTRIN) 200 MG tablet Take 400 mg by mouth every 6 (six) hours as needed for moderate pain.    Yes [provider]  senna-docusate (SENOKOT-S) 8.6-50 MG tablet Take 1 tablet by mouth at bedtime as needed for mild constipation. Patient not taking: Reported on 07/12/2017 07/21/16   Florinda Marker, MD    Inpatient Medications: Scheduled Meds: . Chlorhexidine Gluconate Cloth  6 each Topical Q0600  . doxycycline  100 mg Oral Q12H  . mupirocin ointment  1 application Nasal BID  . nicotine  14 mg Transdermal Daily  . potassium chloride  40 mEq Oral Daily  . sodium chloride flush  3 mL Intravenous Q12H   Continuous Infusions: . sodium chloride Stopped (04/09/18 1344)  . DAPTOmycin (CUBICIN)  IV     PRN Meds: sodium chloride, acetaminophen **OR** acetaminophen, alum & mag hydroxide-simeth, bisacodyl, diphenhydrAMINE, HYDROmorphone (DILAUDID) injection, ondansetron **OR** ondansetron (ZOFRAN) IV, oxyCODONE, senna-docusate  Allergies:    Allergies  Allergen Reactions  . Vancomycin Other (See Comments)    Possible contributor  to AKI and thrombocytopenia    Social History:   Social History   Socioeconomic History  . Marital status: Single    Spouse name: Not on file  . Number of children: Not on file  . Years of education: Not on file  . Highest education level: Not on file  Occupational  History  . Not on file  Social Needs  . Financial resource strain: Not on file  . Food insecurity:    Worry: Not on file    Inability: Not on file  . Transportation needs:    Medical: Not on file    Non-medical: Not on file  Tobacco Use  . Smoking status: Current Every Day Smoker    Packs/day: 0.50    Years: 7.00    Pack years: 3.50    Types: Cigarettes  . Smokeless tobacco: Never Used  . Tobacco comment: slowing down  Substance and Sexual Activity  . Alcohol use: No  . Drug use: Yes    Types: IV, Oxycodone    Comment: heroin  . Sexual activity: Yes    Partners: Male    Birth control/protection: IUD  Lifestyle  . Physical activity:    Days per week: Not on file    Minutes per session: Not on file  . Stress: Not on file  Relationships  . Social connections:    Talks on phone: Not on file    Gets together: Not on file    Attends religious service: Not on file    Active member of club or organization: Not on file    Attends meetings of clubs or organizations: Not on file    Relationship status: Not on file  . Intimate partner violence:    Fear of current or ex partner: Not on file    Emotionally abused: Not on file    Physically abused: Not on file    Forced sexual activity: Not on file  Other Topics Concern  . Not on file  Social History Narrative  . Not on file    Family History:    Family History  Problem Relation Age of Onset  . Alcoholism Father      ROS:  Please see the history of present illness.   All other ROS reviewed and negative.     Physical Exam/Data:   Vitals:   04/09/18 1219 04/09/18 1447 04/09/18 2338 04/10/18 0500  BP:  99/76    Pulse:  97    Resp:  18    Temp: 98.9 F (37.2 C)  98.6 F (37 C)   TempSrc: Oral  Oral   SpO2:  95%    Weight:    110 lb 0.2 oz (49.9 kg)  Height:        Intake/Output Summary (Last 24 hours) at 04/10/2018 1027 Last data filed at 04/09/2018 1556 Gross per 24 hour  Intake 450.56 ml  Output -  Net  450.56 ml   Filed Weights   04/07/18 2237 04/08/18 0100 04/10/18 0500  Weight: 91 lb (41.3 kg) 98 lb 1.6 oz (44.5 kg) 110 lb 0.2 oz (49.9 kg)   Body mass index is 20.79 kg/m.  General:  Unkempt, laying in bed sleeping in NAD upon entering the room. Awakens easily and begins moaning in pain HEENT: sclera anicteric  Neck: no JVD Vascular: No carotid bruits; distal pulses 2+ bilaterally Cardiac:  normal S1, S2; tachycardic, regular rhythm; + murmur heard best at LLSB, no gallops or rubs appreciated Lungs:  clear to  auscultation bilaterally, no wheezing, rhonchi or rales  Abd: NABS, soft, diffuse TTP, splenomegaly Ext: no edema Musculoskeletal:  No deformities, BUE and BLE strength normal and equal Skin: warm and dry; petechial rash noted on b/l LE Neuro:  CNs 2-12 intact, no focal abnormalities noted Psych:  Flat affect.  EKG:  The EKG was personally reviewed and demonstrates:  On admission, sinus tachycardia, rate 120, QTc 452, no STE/D Telemetry:  Telemetry was personally reviewed and demonstrates:  Sinus tachycardia  Relevant CV Studies: Echocardiogram 04/08/18: Study Conclusions  - Left ventricle: The cavity size was normal. Systolic function was vigorous. The estimated ejection fraction was in the range of 65% to 70%. Wall motion was normal; there were no regional wall motion abnormalities. - Tricuspid valve: There is a 1.25 x .34 cm mobile vegetation on the atrial side of the tricuspid valve. There was mild-moderate regurgitation. - Pulmonary arteries: Systolic pressure was moderately increased. PA peak pressure: 55 mm Hg (S).  Impressions:  - There was a vegetation, consistent with endocarditis.  Laboratory Data:  Chemistry Recent Labs  Lab 04/07/18 2346 04/08/18 0643 04/09/18 1102  NA 130* 132* 134*  K 3.5 3.9 3.8  CL 96* 100* 103  CO2 22 23 23   GLUCOSE 111* 117* 124*  BUN 85* 82* 77*  CREATININE 3.65* 3.22* 3.18*  CALCIUM 7.5* 7.2* 7.6*  GFRNONAA 16* 18*  18*  GFRAA 18* 21* 21*  ANIONGAP 12 9 8     Recent Labs  Lab 04/07/18 2108 04/09/18 1102  PROT 7.1 6.0*  ALBUMIN 2.2* 1.5*  AST 18 14*  ALT 12* 10*  ALKPHOS 93 86  BILITOT 0.6 0.7   Hematology Recent Labs  Lab 04/07/18 2108 04/08/18 0643 04/09/18 1102  WBC 3.5* 2.8* 4.8  RBC 3.35* 2.92* 3.59*  HGB 8.2* 7.1* 9.5*  HCT 26.4* 23.0* 29.8*  MCV 78.8 78.8 83.0  MCH 24.5* 24.3* 26.5  MCHC 31.1 30.9 31.9  RDW 15.9* 15.9* 17.8*  PLT 89* 81* 108*   Cardiac EnzymesNo results for input(s): TROPONINI in the last 168 hours.  Recent Labs  Lab 04/07/18 2133  TROPIPOC 0.00    BNP Recent Labs  Lab 04/07/18 2200  BNP 75.2    DDimer No results for input(s): DDIMER in the last 168 hours.  Radiology/Studies:  Dg Chest 2 View  Result Date: 04/07/2018 CLINICAL DATA:  Shortness of breath, bilateral lower extremity swelling. EXAM: CHEST - 2 VIEW COMPARISON:  Radiograph and CT scan of Mar 03, 2018. FINDINGS: The heart size and mediastinal contours are within normal limits. No pneumothorax or pleural effusion is noted. Multiple rounded patchy opacities are seen throughout both lungs concerning for multifocal pneumonia. Right basilar subsegmental atelectasis is noted. The visualized skeletal structures are unremarkable. IMPRESSION: Multiple rounded patchy opacities are noted throughout both lungs most consistent with multifocal pneumonia. Mild right basilar subsegmental atelectasis is noted. Electronically Signed   By: Marijo Conception, M.D.   On: 04/07/2018 21:45   Ct Chest Wo Contrast  Result Date: 04/08/2018 CLINICAL DATA:  Shortness of breath. History of IV drug use. Concern for septic emboli based on chest x-ray. EXAM: CT CHEST WITHOUT CONTRAST TECHNIQUE: Multidetector CT imaging of the chest was performed following the standard protocol without IV contrast. COMPARISON:  CTA chest dated Mar 03, 2018. FINDINGS: Cardiovascular: Normal heart size. No pericardial effusion. Normal caliber  thoracic aorta. Mediastinum/Nodes: Subcentimeter mediastinal lymph nodes are likely reactive. No enlarged axillary lymph nodes. Thyroid gland, trachea, and esophagus demonstrate no significant findings.  Lungs/Pleura: New scattered nodular opacities throughout both lungs, some of which demonstrate cavitation, consistent with septic emboli. The largest nodules measure up to 1.9 cm. More confluent consolidation within the right lower lobe. Focal bronchiectasis and scarring in the anterior right upper lobe likely related to now resolved pneumonia seen on prior CT. Mild diffuse interlobular septal thickening. Small right greater than left pleural effusions. Mild left lower lobe subsegmental atelectasis. Upper Abdomen: No acute abnormality. Musculoskeletal: No acute or significant osseous findings. Old right-sided rib fractures. Moderate disc height loss at T5-T6, unchanged. IMPRESSION: 1. New scattered nodular opacities throughout both lungs, some of which demonstrate cavitation, consistent with septic emboli. 2. More confluent consolidation within the right lower lobe may reflect pneumonia versus atelectasis. 3. Small right greater than left pleural effusions. Mild interstitial pulmonary edema. Electronically Signed   By: Titus Dubin M.D.   On: 04/08/2018 19:18   US Renal  Result Date: 04/08/2018 CLINICAL DATA:  Acute renal failure EXAM: RENAL / URINARY TRACT ULTRASOUND COMPLETE COMPARISON:  CT 07/29/2016 FINDINGS: Right Kidney: Length: 10.9 cm. Increased echogenicity. No hydronephrosis. Trace fluid at the lower pole of right kidney. Left Kidney: Unable to be visualized, secondary to bowel gas. Bladder: Appears normal for degree of bladder distention. Markedly enlarged spleen with volume of 1592.9 cubic cm. 1.9 x 2.1 x 3 cm echogenic area within the spleen, likely corresponding to the hypodense area with calcifications noted on comparison CT. IMPRESSION: 1. Echogenic right kidney consistent with medical renal  disease. No hydronephrosis 2. Nonvisualized left kidney, likely due to bowel gas 3. Massively enlarged spleen with volume of 152.9 cubic cm. Electronically Signed   By: Donavan Foil M.D.   On: 04/08/2018 19:39    Assessment and Plan:   1. Tricuspid valve endocarditis: p/w fever, LE edema, and chest pain. Found to have MRSA bacteremia. Echo 04/08/18 revealed a 1.25 x 0.34 cm vegetation on TV. Noted to have recurrent septic emboli on CT Chest this admission. Possible AKI is contributed to by septic emboli as well.  She has a murmur on exam, best heard at LLSB, as well as splenomegaly. She has prior history of infective endocarditis of her aortic valve 10/2015 with non-compliance with IV antibiotics. Could consider surgical evaluation, although given concerns for ongoing IV drug abuse, doubtful she is a good candidate; high likelihood of recurrence. - Continue IV antibiotics per primary team/ID - Dr. Johnsie Cancel to evaluate later today  2. MRSA bacteremia: on IV daptomycin. ID following - Continue management per primary team/ ID  3. AKI: Cr 3.8 on admission, slowly improving with IVF to 3.18 today. Nephrology following, differential includes septic emboli to kidney parenchyma, syn-infectious GN (seen with MRSA), and hemodynamically mediated.  - Continue to monitor closely - Continue management per primary team/ Nephrology  4. Back pain in patient with history of thoracic osteomyelitis: MRI T/L spine pending.  - Continue management per primary team.   For questions or updates, please contact [place] Please consult [email or web address] for contact info under Cardiology/STEMI.   Signed, Abigail Butts, PA-C  04/10/2018 10:27 AM [phone]  Patient examined chart reviewed Reviewed echo from 04/08/18  Patient is screaming " Pain" and uncooperative. Needs to go to the bathroom as well and has been asking for nurse. She has scoliosis with petechial lesions on legs plus one edema JVP elevated did not  let me examine her with stethoscope She has had no arrhythmias, She has had septic emboli to lungs. She is not a surgical candidate due  to ongoing drug use, lack of adequate Rx and now A/CRF. MRI pending regarding possible complication of osteomyelitis. She has signs of right sided heart failure with malnutrition and now renal failure would use lasix as needed at high dose per renal. Cardiology has nothing to offer There is no need for TEE as she has vegetation on TV that is clearly noted on TTE. Would try to get her to have 6 weeks of inpatient iv antibiotic RX. Would only be surgical candidate if she has negative drug screens, and finishes optimum course of antibiotics neither of which she has been willing to do in past. Palliative care consulted regarding her decisions regarding possible need for dialysis   Cardiology will sign off nothing to add to above sad situation   Jenkins Rouge .

## 2018-04-10 NOTE — Progress Notes (Signed)
PROGRESS NOTE    Molly [name]  YTK:[id number] DOB: 1985-11-26 DOA: 04/07/2018 PCP: Patient, No Pcp Per      Brief Narrative:  Molly [name] is a 32 y.o. 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  Blood cultures obtained and started on linezolid and cefepime.   The patient was recently seen at [facility].  At that time, she had fever, hemoptysis.  This was in mid May 2019: -CXR and CT showed dense RML consolidation. -Hgb 10 g/dL -Cr 0.7 mg/dL -Blood cultures x2 negative -proBNP >1000, but no swelling      Assessment & Plan:  Sepsis Tricuspid valve MRSA endocarditis Echo shows normal EF, tricuspid vegetation 1.2 cm, mild to mod TR.  MRSA in 3/3 blood cultures.   -ID have recommended d/c daptomycin/doxy, change to Teflaro -Consult infectious disease -Oxycodone 10 mg q4 and hydromorphone 2 mg q4hrs, discussed taper to Suboxone when initial medical work up complete -If able to obtain adequate pain relief with higher dose hydromorphone, will obtain MRI today or tomorrow -ETCO2 monitoring   Acute renal failure Anasarca Baseline 0.9 mg/dL, admission at 3.8, improved again.  UOP good.  FeNA 0.4%, renal US unremarkable.  Hep B Sag neg, HIV neg, hep C RNA negative.  No hemolysis.  doubt TTP.  Urine 1.4 g protein.  Cardiology suggest that this is congestion, recommend diuresis. -Consult nephrology -Consult palliative care -Follow  ANCAs, ANA, TTP labs, complements -Trend Cr -Lasix 20 mg IV once  Rash From endocarditis.  Pancytopenia Transfused 2 units. Appropriate bump.  No hemolysis, doubt TTP now. -Trend CBC daily -Follow smear  IVDU Patient seems to be stabilizing.   -Will transition to PCA after MRI -Then will consult IMTS re: suboxone  Hyponatremia ADH up, sodium low, looks like from CHF.  -Diurese and trend  Na  Hypokalemia Resolved with supplement     DVT prophylaxis: SCds Code Status: FULL Family Communication: Molly [name] at bedisde MDM and disposition Plan: Below labs and imaging reports were reviewed and summarized above.  Medication changes as above.  The patient was admitted with recent IV drug use, endocarditis with large greater than 1 cm tricuspid valve vegetation, as well as renal failure.  Cultures are growing MRSA, ID and nephrology and cardiology have been consulted.  She is on ceftaroline, and needs imaging of his spine.  Will undergo an extended course of IV antibiotics in the hospital.     His previous episode of endocarditis was complicated by leaving the hospital AMA.  At present she is too debilitated to do so, but this will likely be, greater problem with the patient's physical condition and rehabilitate somewhat.   Consultants:   Nephrology  Palliative Care  Cardiology  Infectious disease  Procedures:   Echocardiogram Study Conclusions  - Left ventricle: The cavity size was normal. Systolic function was   vigorous. The estimated ejection fraction was in the range of 65%   to 70%. Wall motion was normal; there were no regional wall   motion abnormalities. - Tricuspid valve: There is a 1.25 x .34 cm mobile vegetation on   the atrial side of the tricuspid valve. There was mild-moderate   regurgitation. - Pulmonary arteries: Systolic pressure was moderately increased.   PA peak pressure: 55 mm Hg (S).  Impressions:  - There was a vegetation, consistent with endocarditis.  Antimicrobials:   Linezolid 6/21 >> 6/22  Cefepime 6/21 >> 6/22  Daptomycin 6/22 >> 6/24  Doxycycline 6/22 >> 6/24  Ceftaroline 6/24 >>   Subjective: Back pain severe.  Paitent threatening to leave AMA today, which she is not physically able to do, because of communication between her Molly [name] and her friends.  No fever, confusion, cough, hemoptysis, passing out.           Objective: Vitals:   04/10/18 0500 04/10/18 0900 04/10/18 1000 04/10/18 1100  BP:      Pulse:  (!) 103 (!) 102 97  Resp:  (!) 26 (!) 28 (!) 26  Temp:      TempSrc:      SpO2:  96% 94% 95%  Weight: 49.9 kg (110 lb 0.2 oz)     Height:        Intake/Output Summary (Last 24 hours) at 04/10/2018 1347 Last data filed at 04/10/2018 1000 Gross per 24 hour  Intake 150.29 ml  Output -  Net 150.29 ml   Filed Weights   04/07/18 2237 04/08/18 0100 04/10/18 0500  Weight: 41.3 kg (91 lb) 44.5 kg (98 lb 1.6 oz) 49.9 kg (110 lb 0.2 oz)    Examination: General appearance: Thin adult female, when I first arrived she is slowly limping down the hall, oriented, frustrated HEENT: Anicteric, conjunctive are pink, lids and lashes normal.  No nasal deformity, discharge, epistaxis.  Lips moist, teeth normal.  Oropharynx moist, no oral lesions.  Hearing normal. Skin: Dry, no jaundice.  THe petechio-pustular rash scattered on her legs,  is no different.      Cardiac: Tachycardic, regular, I suspect there is a systolic murmur away on the right side, 1+ nonpitting edema in the legs, pitting edema of the arms, anasarca. Respiratory: Elder Love effort appears normal at rest, no rales or wheezes. Abdomen: Abdomen tender diffusely, spleen massively enlarged. MSK: No deformities or effusions of the large joints of the upper or lower extremities bilaterally, pain in her back for to the examination.. Neuro: Gait shuffling.  Extraocular movements intact, moves all extremities with symmetric strength with global weakness, limited by pain.  Speech fluent.    Psych: Sensorium intact and responding to questions, attention normal.  Affect blunted but irritated, judgment and insight appear normal.    Data Reviewed: I have personally reviewed following labs and imaging studies:  CBC: Recent Labs  Lab 04/07/18 2108 04/08/18 0643 04/09/18 1102  WBC 3.5* 2.8* 4.8  NEUTROABS  --  2.4  --   HGB 8.2* 7.1*  9.5*  HCT 26.4* 23.0* 29.8*  MCV 78.8 78.8 83.0  PLT 89* 81* 094*   Basic Metabolic Panel: Recent Labs  Lab 04/07/18 2108 04/07/18 2346 04/08/18 0643 04/09/18 1102  NA 130* 130* 132* 134*  K 3.4* 3.5 3.9 3.8  CL 93* 96* 100* 103  CO2 23 22 23 23   GLUCOSE 121* 111* 117* 124*  BUN 85* 85* 82* 77*  CREATININE 3.81* 3.65* 3.22* 3.18*  CALCIUM 7.6* 7.5* 7.2* 7.6*  MG  --   --  1.7  --    GFR: Estimated Creatinine Clearance: 19.5 mL/min (A) (by C-G formula based on SCr of 3.18 mg/dL (H)). Liver Function Tests: Recent Labs  Lab 04/07/18 2108 04/09/18 1102  AST 18 14*  ALT 12* 10*  ALKPHOS 93 86  BILITOT 0.6 0.7  PROT 7.1 6.0*  ALBUMIN 2.2* 1.5*   No results for input(s): LIPASE, AMYLASE in the last 168 hours. No results for input(s):  AMMONIA in the last 168 hours. Coagulation Profile: Recent Labs  Lab 04/08/18 0643  INR 1.47   Cardiac Enzymes: Recent Labs  Lab 04/09/18 1102  CKTOTAL 17*   BNP (last 3 results) No results for input(s): PROBNP in the last 8760 hours. HbA1C: No results for input(s): HGBA1C in the last 72 hours. CBG: No results for input(s): GLUCAP in the last 168 hours. Lipid Profile: No results for input(s): CHOL, HDL, LDLCALC, TRIG, CHOLHDL, LDLDIRECT in the last 72 hours. Thyroid Function Tests: No results for input(s): TSH, T4TOTAL, FREET4, T3FREE, THYROIDAB in the last 72 hours. Anemia Panel: No results for input(s): VITAMINB12, FOLATE, FERRITIN, TIBC, IRON, RETICCTPCT in the last 72 hours. Urine analysis:    Component Value Date/Time   COLORURINE AMBER (A) 04/07/2018 2116   APPEARANCEUR CLOUDY (A) 04/07/2018 2116   LABSPEC 1.015 04/07/2018 2116   PHURINE 5.0 04/07/2018 2116   GLUCOSEU 50 (A) 04/07/2018 2116   HGBUR LARGE (A) 04/07/2018 2116   BILIRUBINUR NEGATIVE 04/07/2018 2116   [place] NEGATIVE 04/07/2018 2116   PROTEINUR 100 (A) 04/07/2018 2116   NITRITE NEGATIVE 04/07/2018 2116   LEUKOCYTESUR MODERATE (A) 04/07/2018 2116    Sepsis Labs: @LABRCNTIP (procalcitonin:4,lacticacidven:4)  ) Recent Results (from the past 240 hour(s))  Blood culture (routine x 2)     Status: Abnormal   Collection Time: 04/07/18 10:22 PM  Result Value Ref Range Status   Specimen Description BLOOD LEFT FOREARM  Final   Special Requests   Final    BOTTLES DRAWN AEROBIC AND ANAEROBIC Blood Culture results may not be optimal due to an inadequate volume of blood received in culture bottles   Culture  Setup Time   Final    GRAM POSITIVE COCCI IN BOTH AEROBIC AND ANAEROBIC BOTTLES CRITICAL RESULT CALLED TO, READ BACK BY AND VERIFIED WITH: PHARMD E [place] 04/08/18 AR 1316 BY CM Performed at [facility], [place] [address], [place], [place] [address]    Culture METHICILLIN RESISTANT STAPHYLOCOCCUS AUREUS (A)  Final   Report Status 04/10/2018 FINAL  Final   Organism ID, Bacteria METHICILLIN RESISTANT STAPHYLOCOCCUS AUREUS  Final      Susceptibility   Methicillin resistant staphylococcus aureus - MIC*    CIPROFLOXACIN <=0.5 SENSITIVE Sensitive     ERYTHROMYCIN >=8 RESISTANT Resistant     GENTAMICIN <=0.5 SENSITIVE Sensitive     OXACILLIN >=4 RESISTANT Resistant     TETRACYCLINE <=1 SENSITIVE Sensitive     VANCOMYCIN 1 SENSITIVE Sensitive     TRIMETH/SULFA <=10 SENSITIVE Sensitive     CLINDAMYCIN <=0.25 SENSITIVE Sensitive     RIFAMPIN <=0.5 SENSITIVE Sensitive     Inducible Clindamycin NEGATIVE Sensitive     * METHICILLIN RESISTANT STAPHYLOCOCCUS AUREUS  Blood Culture ID Panel (Reflexed)     Status: Abnormal   Collection Time: 04/07/18 10:22 PM  Result Value Ref Range Status   Enterococcus species NOT DETECTED NOT DETECTED Final   Listeria monocytogenes NOT DETECTED NOT DETECTED Final   Staphylococcus species DETECTED (A) NOT DETECTED Final    Comment: CRITICAL RESULT CALLED TO, READ BACK BY AND VERIFIED WITH: PHARMD E [place] 04/08/18 AT 1319 BU CM    Staphylococcus aureus DETECTED (A) NOT DETECTED Final    Comment:  Methicillin (oxacillin)-resistant Staphylococcus aureus (MRSA). MRSA is predictably resistant to beta-lactam antibiotics (except ceftaroline). Preferred therapy is vancomycin unless clinically contraindicated. Patient requires contact precautions if  hospitalized. CRITICAL RESULT CALLED TO, READ BACK BY AND VERIFIED WITH: PHARMD E [place] 04/08/18 AT 1319 BY CM  Methicillin resistance DETECTED (A) NOT DETECTED Final    Comment: CRITICAL RESULT CALLED TO, READ BACK BY AND VERIFIED WITH: PHARMD E [place] 04/08/18 AT 1319 BY CM    Streptococcus species NOT DETECTED NOT DETECTED Final   Streptococcus agalactiae NOT DETECTED NOT DETECTED Final   Streptococcus pneumoniae NOT DETECTED NOT DETECTED Final   Streptococcus pyogenes NOT DETECTED NOT DETECTED Final   Acinetobacter baumannii NOT DETECTED NOT DETECTED Final   Enterobacteriaceae species NOT DETECTED NOT DETECTED Final   Enterobacter cloacae complex NOT DETECTED NOT DETECTED Final   Escherichia coli NOT DETECTED NOT DETECTED Final   Klebsiella oxytoca NOT DETECTED NOT DETECTED Final   Klebsiella pneumoniae NOT DETECTED NOT DETECTED Final   Proteus species NOT DETECTED NOT DETECTED Final   Serratia marcescens NOT DETECTED NOT DETECTED Final   Haemophilus influenzae NOT DETECTED NOT DETECTED Final   Neisseria meningitidis NOT DETECTED NOT DETECTED Final   Pseudomonas aeruginosa NOT DETECTED NOT DETECTED Final   Candida albicans NOT DETECTED NOT DETECTED Final   Candida glabrata NOT DETECTED NOT DETECTED Final   Candida krusei NOT DETECTED NOT DETECTED Final   Candida parapsilosis NOT DETECTED NOT DETECTED Final   Candida tropicalis NOT DETECTED NOT DETECTED Final    Comment: Performed at [facility], [place]. [address], [place], [place] [address]  Blood culture (routine x 2)     Status: Abnormal   Collection Time: 04/07/18 10:35 PM  Result Value Ref Range Status   Specimen Description BLOOD RIGHT ANTECUBITAL  Final   Special  Requests   Final    BOTTLES DRAWN AEROBIC AND ANAEROBIC Blood Culture results may not be optimal due to an inadequate volume of blood received in culture bottles   Culture  Setup Time   Final    GRAM POSITIVE COCCI IN BOTH AEROBIC AND ANAEROBIC BOTTLES CRITICAL VALUE NOTED.  VALUE IS CONSISTENT WITH PREVIOUSLY REPORTED AND CALLED VALUE.    Culture (A)  Final    STAPHYLOCOCCUS AUREUS SUSCEPTIBILITIES PERFORMED ON PREVIOUS CULTURE WITHIN THE LAST 5 DAYS. Performed at [facility], [place] [address] [place], [place] [address]    Report Status 04/10/2018 FINAL  Final  Culture, blood (single)     Status: Abnormal   Collection Time: 04/07/18 11:08 PM  Result Value Ref Range Status   Specimen Description BLOOD RIGHT HAND  Final   Special Requests   Final    BOTTLES DRAWN AEROBIC AND ANAEROBIC Blood Culture results may not be optimal due to an inadequate volume of blood received in culture bottles   Culture  Setup Time   Final    GRAM POSITIVE COCCI IN BOTH AEROBIC AND ANAEROBIC BOTTLES CRITICAL VALUE NOTED.  VALUE IS CONSISTENT WITH PREVIOUSLY REPORTED AND CALLED VALUE.    Culture (A)  Final    STAPHYLOCOCCUS AUREUS SUSCEPTIBILITIES PERFORMED ON PREVIOUS CULTURE WITHIN THE LAST 5 DAYS. Performed at [facility], [place] [address] [place],  [address]    Report Status 04/10/2018 FINAL  Final  MRSA PCR Screening     Status: Abnormal   Collection Time: 04/08/18  2:27 AM  Result Value Ref Range Status   MRSA by PCR POSITIVE (A) NEGATIVE Final    Comment:        The GeneXpert MRSA Assay (FDA approved for NASAL specimens only), is one component of a comprehensive MRSA colonization surveillance program. It is not intended to diagnose MRSA infection nor to guide or monitor treatment for MRSA infections. RESULT CALLED  TO, READ BACK BY AND VERIFIED WITHParks Ranger RN 04/08/18 0801 JDW          Radiology Studies: Ct Chest Wo Contrast  Result Date:  04/08/2018 CLINICAL DATA:  Shortness of breath. History of IV drug use. Concern for septic emboli based on chest x-ray. EXAM: CT CHEST WITHOUT CONTRAST TECHNIQUE: Multidetector CT imaging of the chest was performed following the standard protocol without IV contrast. COMPARISON:  CTA chest dated Mar 03, 2018. FINDINGS: Cardiovascular: Normal heart size. No pericardial effusion. Normal caliber thoracic aorta. Mediastinum/Nodes: Subcentimeter mediastinal lymph nodes are likely reactive. No enlarged axillary lymph nodes. Thyroid gland, trachea, and esophagus demonstrate no significant findings. Lungs/Pleura: New scattered nodular opacities throughout both lungs, some of which demonstrate cavitation, consistent with septic emboli. The largest nodules measure up to 1.9 cm. More confluent consolidation within the right lower lobe. Focal bronchiectasis and scarring in the anterior right upper lobe likely related to now resolved pneumonia seen on prior CT. Mild diffuse interlobular septal thickening. Small right greater than left pleural effusions. Mild left lower lobe subsegmental atelectasis. Upper Abdomen: No acute abnormality. Musculoskeletal: No acute or significant osseous findings. Old right-sided rib fractures. Moderate disc height loss at T5-T6, unchanged. IMPRESSION: 1. New scattered nodular opacities throughout both lungs, some of which demonstrate cavitation, consistent with septic emboli. 2. More confluent consolidation within the right lower lobe may reflect pneumonia versus atelectasis. 3. Small right greater than left pleural effusions. Mild interstitial pulmonary edema. Electronically Signed   By: Titus Dubin M.D.   On: 04/08/2018 19:18   US Renal  Result Date: 04/08/2018 CLINICAL DATA:  Acute renal failure EXAM: RENAL / URINARY TRACT ULTRASOUND COMPLETE COMPARISON:  CT 07/29/2016 FINDINGS: Right Kidney: Length: 10.9 cm. Increased echogenicity. No hydronephrosis. Trace fluid at the lower pole of  right kidney. Left Kidney: Unable to be visualized, secondary to bowel gas. Bladder: Appears normal for degree of bladder distention. Markedly enlarged spleen with volume of 1592.9 cubic cm. 1.9 x 2.1 x 3 cm echogenic area within the spleen, likely corresponding to the hypodense area with calcifications noted on comparison CT. IMPRESSION: 1. Echogenic right kidney consistent with medical renal disease. No hydronephrosis 2. Nonvisualized left kidney, likely due to bowel gas 3. Massively enlarged spleen with volume of 152.9 cubic cm. Electronically Signed   By: Donavan Foil M.D.   On: 04/08/2018 19:39        Scheduled Meds: . Chlorhexidine Gluconate Cloth  6 each Topical Q0600  . mupirocin ointment  1 application Nasal BID  . nicotine  14 mg Transdermal Daily  . potassium chloride  40 mEq Oral Daily  . sodium chloride flush  3 mL Intravenous Q12H   Continuous Infusions: . sodium chloride Stopped (04/09/18 1344)  . ceFTAROline (TEFLARO) IV       LOS: 2 days    Time spent: 25 minutes    Edwin Dada, MD [facility] 04/10/2018, 1:47 PM     Pager [phone] --- please page though AMION:  [email or web address] If 7PM-7AM, please contact night-coverage

## 2018-04-10 NOTE — Plan of Care (Signed)

## 2018-04-11 ENCOUNTER — Inpatient Hospital Stay (HOSPITAL_COMMUNITY): Payer: Self-pay | Admitting: Certified Registered"

## 2018-04-11 ENCOUNTER — Other Ambulatory Visit: Payer: Self-pay

## 2018-04-11 ENCOUNTER — Inpatient Hospital Stay (HOSPITAL_COMMUNITY): Payer: Self-pay

## 2018-04-11 ENCOUNTER — Encounter (HOSPITAL_COMMUNITY): Payer: Self-pay | Admitting: Certified Registered"

## 2018-04-11 ENCOUNTER — Encounter (HOSPITAL_COMMUNITY)
Admission: EM | Discharge status: Against Medical Advice | Payer: Self-pay | Source: Home / Self Care | Attending: Family Medicine

## 2018-04-11 DIAGNOSIS — A419 Sepsis, unspecified organism: Secondary | ICD-10-CM

## 2018-04-11 DIAGNOSIS — I2690 Septic pulmonary embolism without acute cor pulmonale: Secondary | ICD-10-CM

## 2018-04-11 DIAGNOSIS — R6520 Severe sepsis without septic shock: Secondary | ICD-10-CM

## 2018-04-11 DIAGNOSIS — F1120 Opioid dependence, uncomplicated: Secondary | ICD-10-CM

## 2018-04-11 DIAGNOSIS — M4647 Discitis, unspecified, lumbosacral region: Secondary | ICD-10-CM

## 2018-04-11 DIAGNOSIS — G062 Extradural and subdural abscess, unspecified: Secondary | ICD-10-CM

## 2018-04-11 HISTORY — PX: RADIOLOGY WITH ANESTHESIA: SHX6223

## 2018-04-11 LAB — CBC
HCT: 28.8 % — ABNORMAL LOW (ref 36.0–46.0)
Hemoglobin: 8.9 g/dL — ABNORMAL LOW (ref 12.0–15.0)
MCH: 25.9 pg — AB (ref 26.0–34.0)
MCHC: 30.9 g/dL (ref 30.0–36.0)
MCV: 84 fL (ref 78.0–100.0)
PLATELETS: 126 10*3/uL — AB (ref 150–400)
RBC: 3.43 MIL/uL — AB (ref 3.87–5.11)
RDW: 18.2 % — ABNORMAL HIGH (ref 11.5–15.5)
WBC: 3.9 10*3/uL — ABNORMAL LOW (ref 4.0–10.5)

## 2018-04-11 LAB — BASIC METABOLIC PANEL
Anion gap: 6 (ref 5–15)
BUN: 58 mg/dL — AB (ref 6–20)
CALCIUM: 7.6 mg/dL — AB (ref 8.9–10.3)
CHLORIDE: 109 mmol/L (ref 98–111)
CO2: 21 mmol/L — AB (ref 22–32)
CREATININE: 1.8 mg/dL — AB (ref 0.44–1.00)
GFR calc Af Amer: 43 mL/min — ABNORMAL LOW (ref >60)
GFR calc non Af Amer: 37 mL/min — ABNORMAL LOW (ref >60)
GLUCOSE: 109 mg/dL — AB (ref 70–99)
Potassium: 4.6 mmol/L (ref 3.5–5.1)
Sodium: 136 mmol/L (ref 135–145)

## 2018-04-11 LAB — BRAIN NATRIURETIC PEPTIDE: B NATRIURETIC PEPTIDE 5: 518.4 pg/mL — AB (ref 0.0–100.0)

## 2018-04-11 LAB — MPO/PR-3 (ANCA) ANTIBODIES

## 2018-04-11 LAB — ANTINUCLEAR ANTIBODIES, IFA: ANTINUCLEAR ANTIBODIES, IFA: NEGATIVE

## 2018-04-11 SURGERY — MRI WITH ANESTHESIA
Anesthesia: General

## 2018-04-11 MED ORDER — ONDANSETRON HCL 4 MG/2ML IJ SOLN
INTRAMUSCULAR | Status: DC | PRN
  Administered 2018-04-11: 4 mg via INTRAVENOUS

## 2018-04-11 MED ORDER — DEXMEDETOMIDINE HCL IN NACL 200 MCG/50ML IV SOLN
INTRAVENOUS | Status: DC | PRN
  Administered 2018-04-11: 14 ug via INTRAVENOUS
  Administered 2018-04-11: 6 ug via INTRAVENOUS

## 2018-04-11 MED ORDER — ONDANSETRON HCL 4 MG/2ML IJ SOLN
4.0000 mg | Freq: Four times a day (QID) | INTRAMUSCULAR | Status: DC | PRN
  Filled 2018-04-11: qty 2

## 2018-04-11 MED ORDER — HYDROMORPHONE HCL 1 MG/ML IJ SOLN: 0.2500 mg | INTRAMUSCULAR | Status: DC | PRN

## 2018-04-11 MED ORDER — DIPHENHYDRAMINE HCL 50 MG/ML IJ SOLN
12.5000 mg | Freq: Four times a day (QID) | INTRAMUSCULAR | Status: DC | PRN
  Filled 2018-04-11: qty 0.25

## 2018-04-11 MED ORDER — ONDANSETRON HCL 4 MG/2ML IJ SOLN: 4.0000 mg | Freq: Once | INTRAMUSCULAR | Status: DC | PRN

## 2018-04-11 MED ORDER — GADOBENATE DIMEGLUMINE 529 MG/ML IV SOLN
10.0000 mL | Freq: Once | INTRAVENOUS | Status: AC | PRN
  Administered 2018-04-11: 6 mL via INTRAVENOUS

## 2018-04-11 MED ORDER — DIPHENHYDRAMINE HCL 12.5 MG/5ML PO ELIX
12.5000 mg | ORAL_SOLUTION | Freq: Four times a day (QID) | ORAL | Status: DC | PRN
  Filled 2018-04-11: qty 5

## 2018-04-11 MED ORDER — DEXAMETHASONE SODIUM PHOSPHATE 4 MG/ML IJ SOLN
INTRAMUSCULAR | Status: DC | PRN
  Administered 2018-04-11: 4 mg via INTRAVENOUS

## 2018-04-11 MED ORDER — PHENYLEPHRINE 40 MCG/ML (10ML) SYRINGE FOR IV PUSH (FOR BLOOD PRESSURE SUPPORT)
PREFILLED_SYRINGE | INTRAVENOUS | Status: DC | PRN
  Administered 2018-04-11 (×2): 40 ug via INTRAVENOUS

## 2018-04-11 MED ORDER — MIDAZOLAM HCL 5 MG/5ML IJ SOLN
INTRAMUSCULAR | Status: DC | PRN
  Administered 2018-04-11: 2 mg via INTRAVENOUS

## 2018-04-11 MED ORDER — NALOXONE HCL 0.4 MG/ML IJ SOLN
0.4000 mg | INTRAMUSCULAR | Status: DC | PRN
  Filled 2018-04-11: qty 1

## 2018-04-11 MED ORDER — LACTATED RINGERS IV SOLN
INTRAVENOUS | Status: DC | PRN
  Administered 2018-04-11 (×2): via INTRAVENOUS

## 2018-04-11 MED ORDER — SODIUM CHLORIDE 0.9% FLUSH: 9.0000 mL | INTRAVENOUS | Status: DC | PRN

## 2018-04-11 MED ORDER — LACTATED RINGERS IV SOLN
INTRAVENOUS | Status: DC
  Administered 2018-04-11: 12:00:00 via INTRAVENOUS

## 2018-04-11 MED ORDER — FENTANYL CITRATE (PF) 100 MCG/2ML IJ SOLN
INTRAMUSCULAR | Status: DC | PRN
  Administered 2018-04-11: 50 ug via INTRAVENOUS
  Administered 2018-04-11: 100 ug via INTRAVENOUS
  Administered 2018-04-11 (×3): 50 ug via INTRAVENOUS

## 2018-04-11 MED ORDER — PROPOFOL 10 MG/ML IV BOLUS
INTRAVENOUS | Status: DC | PRN
  Administered 2018-04-11: 200 mg via INTRAVENOUS

## 2018-04-11 MED ORDER — POLYETHYLENE GLYCOL 3350 17 G PO PACK
17.0000 g | PACK | Freq: Every day | ORAL | Status: DC
  Administered 2018-04-12: 17 g via ORAL
  Filled 2018-04-11: qty 1

## 2018-04-11 MED ORDER — HYDROMORPHONE 1 MG/ML IV SOLN
INTRAVENOUS | Status: DC
  Administered 2018-04-11: 0.4 mg via INTRAVENOUS
  Administered 2018-04-11: 0.8 mg via INTRAVENOUS
  Administered 2018-04-12: 5.2 mg via INTRAVENOUS
  Administered 2018-04-12: 4.8 mg via INTRAVENOUS
  Administered 2018-04-12: 3.6 mg via INTRAVENOUS
  Administered 2018-04-12: 4.4 mg via INTRAVENOUS
  Filled 2018-04-11 (×2): qty 25

## 2018-04-11 NOTE — Anesthesia Postprocedure Evaluation (Signed)
Anesthesia Post Note  Patient: Molly [name]  Procedure(s) Performed: MRI OF LUMBAR AND THORACIC SPINE WITHOUT CONTRAST WITH ANESTHESIA (N/A )     Patient location during evaluation: PACU Anesthesia Type: General Level of consciousness: awake and alert Pain management: pain level controlled Vital Signs Assessment: post-procedure vital signs reviewed and stable Respiratory status: spontaneous breathing, nonlabored ventilation and respiratory function stable Cardiovascular status: blood pressure returned to baseline and stable Postop Assessment: no apparent nausea or vomiting Anesthetic complications: no    Last Vitals:  Vitals:   04/11/18 1000 04/11/18 1436  BP:  109/75  Pulse:  81  Resp: (!) 22 (!) 25  Temp:  36.8 C  SpO2:  97%    Last Pain:  Vitals:   04/11/18 1436  TempSrc:   PainSc: 0-No pain                 Molly [name] A.

## 2018-04-11 NOTE — Progress Notes (Signed)
PROGRESS NOTE    Molly [name]  UJW:[id number] DOB: 09-16-1986 DOA: 04/07/2018 PCP: Patient, No Pcp Per      Brief Narrative:  Ms Kenealy is a 32 y.o. 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  Blood cultures obtained and started on linezolid and cefepime.        Assessment & Plan:  Sepsis from MRSA Tricuspid valve MRSA endocarditis Septic pulmonary emboli Echo shows normal EF, tricuspid vegetation 1.2 cm, mild to mod TR.  MRSA in 3/3 blood cultures.  Repeat blood cultures negative so far.  MRI today showed sacral epidural abscess, sacral osteomyelitis, intradiscal abscess, pyomyositis. -Continue ceftaroline -Consult infectious disease   Sacral epidural abscess Sacral osteomyelitis T12-L1 intradiscal abscess No leg weakness, bowel or bladder incontinence. -Consult neurosurgery, appreciate recommendations -Start PCA 0.7 demand dose, 10 minute lockout, 2 mg hourly limit  Acute renal failure Anasarca Baseline 0.9 mg/dL, admission at 3.8, repeat today improved.  UOP still good.   FeNA 0.4%, renal US unremarkable.  Hep B Sag neg, HIV neg, hep C RNA negative.  No hemolysis.  doubt TTP.  Urine 1.4 g protein.   Seems to be resolving without diuresis. -Consult Nephrology, appreciate cares -Follow  ANCAs, ANA -Trend Cr  Rash From endocarditis.  Pancytopenia Bone marrow suppression in infection. Transfused 2 units on admission. Appropriate bump.  No hemolysis.  Doubt TTP, doubt DIC. CBC stable today. -Trend CBC daily  IVDU Discussed with Dr. Evette Doffing today.  Patient's acute pain in her sacrum, lower back appears to be overshadowing withdrawal at this time.  He recommends treating acute pain for now, with full agonists (PCA or not), then when acute pain starts to resolve, transition to Suboxone.  Will need to call IMTS  back when acute pain subsiding.  Hyponatremia Improving  Hypokalemia Resolved with supplement        DVT prophylaxis: SCDs Code Status: FULL Family Communication: Mother by phone MDM and disposition Plan: Below labs and imaging reports were reviewed and summarized above.  Medication changes are made as above.  Case was discussed with infectious disease, internal medicine, and neurosurgery.  The patient was admitted with recent IV drug use, endocarditis with large greater than 1 cm tricuspid valve vegetation, and renal failure.  Cultures are now growing MRSA, ID and nephrology and cardiology and neurosurgery have been consulted.  She is on ceftaroline, will need at least 6 weeks of inpatient antibiotics.  His previous episode of endocarditis was complicated by leaving the hospital AMA.  At present she is too debilitated to do so, but this will likely be, greater problem with the patient's physical condition and rehabilitate somewhat.   Consultants:   Nephrology  Palliative Care  Cardiology  Infectious disease  Neurosurgery  Procedures:   Echocardiogram Study Conclusions  - Left ventricle: The cavity size was normal. Systolic function was   vigorous. The estimated ejection fraction was in the range of 65%   to 70%. Wall motion was normal; there were no regional wall   motion abnormalities. - Tricuspid valve: There is a 1.25 x .34 cm mobile vegetation on   the atrial side of the tricuspid valve. There was mild-moderate   regurgitation. - Pulmonary arteries: Systolic pressure was moderately increased.   PA peak pressure: 55 mm Hg (S).  Impressions:  - There was a vegetation, consistent with endocarditis.  Antimicrobials:   Linezolid 6/21 >> 6/22  Cefepime 6/21 >> 6/22  Daptomycin 6/22 >> 6/24  Doxycycline 6/22 >> 6/24  Ceftaroline 6/24 >>   Subjective: She still has severe back pain, pain whenever she was.  She has no leg numbness, weakness.   She has no numbness in the saddle area, no bowel or bladder incontinence.  She has had no more fever.  No confusion, cough, sputum, hemoptysis, syncope.          Objective: Vitals:   04/11/18 1450 04/11/18 1500 04/11/18 1530 04/11/18 1700  BP: 110/72 111/74  (!) 116/91  Pulse: 74 80  80  Resp: (!) 22 (!) 29 (!) 24 15  Temp: (!) 97.5 F (36.4 C)   98.3 F (36.8 C)  TempSrc:    Oral  SpO2: 96% 93% 94% 93%  Weight:      Height:        Intake/Output Summary (Last 24 hours) at 04/11/2018 1729 Last data filed at 04/11/2018 1440 Gross per 24 hour  Intake 1303 ml  Output -  Net 1303 ml   Filed Weights   04/07/18 2237 04/08/18 0100 04/10/18 0500  Weight: 41.3 kg (91 lb) 44.5 kg (98 lb 1.6 oz) 49.9 kg (110 lb 0.2 oz)    Examination: General appearance: Thin adult female, lying in bed, interactive, in acute pain. HEENT: Anicteric, conjunctival pink, lids and lashes normal.  No nasal deformity, discharge, epistaxis.  Lips moist, teeth normal.  Oropharynx moist, no oral lesions.  Hearing normal. Skin: Dry, no jaundice.  The petechial pustular rash scattered on her legs has not spread or evolved.    Cardiac: Tachycardic, regular, no murmurs appreciated today, no pitting edema, the pitting edema of her arms is resolved. Respiratory: Respiratory effort normal, no rales or wheezes. Abdomen: Abdomen soft without tenderness to palpation, spleen enlarged. MSK: No deformities or effusions of the large joints of the upper or lower extremities bilaterally.  There is an old stable deformity of her thoracic spine without fluctuance. Neuro: Cranial nerves normal, moves all extremities with symmetric strength, global weakness, limited by pain.  Speech fluent.    Psych: Sensorium intact and responding to questions, intention normal.  Affect blunted, by pain.  Judgment and insight appear normal.    Data Reviewed: I have personally reviewed following labs and imaging studies:  CBC: Recent Labs    Lab 04/07/18 2108 04/08/18 0643 04/09/18 1102 04/11/18 0900  WBC 3.5* 2.8* 4.8 3.9*  NEUTROABS  --  2.4  --   --   HGB 8.2* 7.1* 9.5* 8.9*  HCT 26.4* 23.0* 29.8* 28.8*  MCV 78.8 78.8 83.0 84.0  PLT 89* 81* 108* 478*   Basic Metabolic Panel: Recent Labs  Lab 04/07/18 2108 04/07/18 2346 04/08/18 0643 04/09/18 1102 04/11/18 0900  NA 130* 130* 132* 134* 136  K 3.4* 3.5 3.9 3.8 4.6  CL 93* 96* 100* 103 109  CO2 23 22 23 23  21*  GLUCOSE 121* 111* 117* 124* 109*  BUN 85* 85* 82* 77* 58*  CREATININE 3.81* 3.65* 3.22* 3.18* 1.80*  CALCIUM 7.6* 7.5* 7.2* 7.6* 7.6*  MG  --   --  1.7  --   --    GFR: Estimated Creatinine Clearance: 34.5 mL/min (A) (by C-G formula based on SCr of 1.8 mg/dL (H)). Liver Function Tests: Recent Labs  Lab 04/07/18 2108 04/09/18 1102  AST 18 14*  ALT 12* 10*  ALKPHOS 93 86  BILITOT 0.6 0.7  PROT 7.1 6.0*  ALBUMIN 2.2* 1.5*   No results for input(s): LIPASE, AMYLASE in the last 168 hours. No results for input(s): AMMONIA in the last 168 hours. Coagulation Profile: Recent Labs  Lab 04/08/18 0643  INR 1.47   Cardiac Enzymes: Recent Labs  Lab 04/09/18 1102  CKTOTAL 17*   BNP (last 3 results) No results for input(s): PROBNP in the last 8760 hours. HbA1C: No results for input(s): HGBA1C in the last 72 hours. CBG: No results for input(s): GLUCAP in the last 168 hours. Lipid Profile: No results for input(s): CHOL, HDL, LDLCALC, TRIG, CHOLHDL, LDLDIRECT in the last 72 hours. Thyroid Function Tests: No results for input(s): TSH, T4TOTAL, FREET4, T3FREE, THYROIDAB in the last 72 hours. Anemia Panel: No results for input(s): VITAMINB12, FOLATE, FERRITIN, TIBC, IRON, RETICCTPCT in the last 72 hours. Urine analysis:    Component Value Date/Time   COLORURINE AMBER (A) 04/07/2018 2116   APPEARANCEUR CLOUDY (A) 04/07/2018 2116   LABSPEC 1.015 04/07/2018 2116   PHURINE 5.0 04/07/2018 2116   GLUCOSEU 50 (A) 04/07/2018 2116   HGBUR LARGE (A)  04/07/2018 2116   BILIRUBINUR NEGATIVE 04/07/2018 2116   [place] NEGATIVE 04/07/2018 2116   PROTEINUR 100 (A) 04/07/2018 2116   NITRITE NEGATIVE 04/07/2018 2116   LEUKOCYTESUR MODERATE (A) 04/07/2018 2116   Sepsis Labs: @LABRCNTIP (procalcitonin:4,lacticacidven:4)  ) Recent Results (from the past 240 hour(s))  Blood culture (routine x 2)     Status: Abnormal   Collection Time: 04/07/18 10:22 PM  Result Value Ref Range Status   Specimen Description BLOOD LEFT FOREARM  Final   Special Requests   Final    BOTTLES DRAWN AEROBIC AND ANAEROBIC Blood Culture results may not be optimal due to an inadequate volume of blood received in culture bottles   Culture  Setup Time   Final    GRAM POSITIVE COCCI IN BOTH AEROBIC AND ANAEROBIC BOTTLES CRITICAL RESULT CALLED TO, READ BACK BY AND VERIFIED WITH: PHARMD E [place] 04/08/18 AR 1316 BY CM Performed at [facility], [place] [address] [place], [place] [address]    Culture METHICILLIN RESISTANT STAPHYLOCOCCUS AUREUS (A)  Final   Report Status 04/10/2018 FINAL  Final   Organism ID, Bacteria METHICILLIN RESISTANT STAPHYLOCOCCUS AUREUS  Final      Susceptibility   Methicillin resistant staphylococcus aureus - MIC*    CIPROFLOXACIN <=0.5 SENSITIVE Sensitive     ERYTHROMYCIN >=8 RESISTANT Resistant     GENTAMICIN <=0.5 SENSITIVE Sensitive     OXACILLIN >=4 RESISTANT Resistant     TETRACYCLINE <=1 SENSITIVE Sensitive     VANCOMYCIN 1 SENSITIVE Sensitive     TRIMETH/SULFA <=10 SENSITIVE Sensitive     CLINDAMYCIN <=0.25 SENSITIVE Sensitive     RIFAMPIN <=0.5 SENSITIVE Sensitive     Inducible Clindamycin NEGATIVE Sensitive     * METHICILLIN RESISTANT STAPHYLOCOCCUS AUREUS  Blood Culture ID Panel (Reflexed)     Status: Abnormal   Collection Time: 04/07/18 10:22 PM  Result Value Ref Range Status   Enterococcus species NOT DETECTED NOT DETECTED Final   Listeria monocytogenes NOT DETECTED NOT DETECTED Final   Staphylococcus species DETECTED  (A) NOT DETECTED Final    Comment: CRITICAL RESULT CALLED TO, READ BACK BY AND VERIFIED WITH: PHARMD E SINCLEAR 04/08/18 AT 1319 BU CM    Staphylococcus aureus DETECTED (A) NOT DETECTED Final    Comment: Methicillin (oxacillin)-resistant Staphylococcus aureus (MRSA). MRSA is predictably resistant to beta-lactam antibiotics (except ceftaroline). Preferred therapy is vancomycin unless clinically contraindicated. Patient requires contact precautions if  hospitalized. CRITICAL RESULT CALLED TO, READ BACK BY AND VERIFIED WITH: PHARMD E SINCLEAR 04/08/18 AT 1319 BY CM    Methicillin resistance DETECTED (A) NOT DETECTED Final    Comment: CRITICAL RESULT CALLED TO, READ BACK BY AND VERIFIED WITH: PHARMD E SINCLEAR 04/08/18 AT 1319 BY CM    Streptococcus species NOT DETECTED NOT DETECTED Final   Streptococcus agalactiae NOT DETECTED NOT DETECTED Final   Streptococcus pneumoniae NOT DETECTED NOT DETECTED Final   Streptococcus pyogenes NOT DETECTED NOT DETECTED Final   Acinetobacter baumannii NOT DETECTED NOT DETECTED Final   Enterobacteriaceae species NOT DETECTED NOT DETECTED Final   Enterobacter cloacae complex NOT DETECTED NOT DETECTED Final   Escherichia coli NOT DETECTED NOT DETECTED Final   Klebsiella oxytoca NOT DETECTED NOT DETECTED Final   Klebsiella pneumoniae NOT DETECTED NOT DETECTED Final   Proteus species NOT DETECTED NOT DETECTED Final   Serratia marcescens NOT DETECTED NOT DETECTED Final   Haemophilus influenzae NOT DETECTED NOT DETECTED Final   Neisseria meningitidis NOT DETECTED NOT DETECTED Final   Pseudomonas aeruginosa NOT DETECTED NOT DETECTED Final   Candida albicans NOT DETECTED NOT DETECTED Final   Candida glabrata NOT DETECTED NOT DETECTED Final   Candida krusei NOT DETECTED NOT DETECTED Final   Candida parapsilosis NOT DETECTED NOT DETECTED Final   Candida tropicalis NOT DETECTED NOT DETECTED Final    Comment: Performed at [facility], [place] [address],  [place], [place] [address]  Blood culture (routine x 2)     Status: Abnormal   Collection Time: 04/07/18 10:35 PM  Result Value Ref Range Status   Specimen Description BLOOD RIGHT ANTECUBITAL  Final   Special Requests   Final    BOTTLES DRAWN AEROBIC AND ANAEROBIC Blood Culture results may not be optimal due to an inadequate volume of blood received in culture bottles   Culture  Setup Time   Final    GRAM POSITIVE COCCI IN BOTH AEROBIC AND ANAEROBIC BOTTLES CRITICAL VALUE NOTED.  VALUE IS CONSISTENT WITH PREVIOUSLY REPORTED AND CALLED VALUE.    Culture (A)  Final    STAPHYLOCOCCUS AUREUS SUSCEPTIBILITIES PERFORMED ON PREVIOUS CULTURE WITHIN THE LAST 5 DAYS. Performed at [facility], [place] [address], [place], [place] [address]    Report Status 04/10/2018 FINAL  Final  Culture, blood (single)     Status: Abnormal   Collection Time: 04/07/18 11:08 PM  Result Value Ref Range Status   Specimen Description BLOOD RIGHT HAND  Final   Special Requests   Final    BOTTLES DRAWN AEROBIC AND ANAEROBIC Blood Culture results may not be optimal due to an inadequate volume of blood received in culture bottles   Culture  Setup Time   Final    GRAM POSITIVE COCCI IN BOTH AEROBIC AND ANAEROBIC BOTTLES CRITICAL VALUE NOTED.  VALUE IS CONSISTENT WITH PREVIOUSLY REPORTED AND CALLED VALUE.    Culture (A)  Final    STAPHYLOCOCCUS AUREUS SUSCEPTIBILITIES PERFORMED ON PREVIOUS CULTURE WITHIN THE LAST 5 DAYS. Performed at [facility], [place] [address] [place], [place] [address]    Report Status 04/10/2018 FINAL  Final  MRSA PCR Screening     Status: Abnormal   Collection Time: 04/08/18  2:27 AM  Result Value Ref Range Status   MRSA by PCR POSITIVE (A) NEGATIVE Final    Comment:        The GeneXpert MRSA Assay (FDA approved for NASAL specimens only), is one component of a comprehensive MRSA  colonization surveillance program. It is not intended to diagnose MRSA infection nor to guide  or monitor treatment for MRSA infections. RESULT CALLED TO, READ BACK BY AND VERIFIED WITH: Parks Ranger RN 04/08/18 0801 JDW   Culture, blood (routine x 2)     Status: None (Preliminary result)   Collection Time: 04/10/18  2:20 PM  Result Value Ref Range Status   Specimen Description BLOOD RIGHT ANTECUBITAL  Final   Special Requests   Final    BOTTLES DRAWN AEROBIC ONLY Blood Culture results may not be optimal due to an inadequate volume of blood received in culture bottles   Culture   Final    NO GROWTH < 24 HOURS Performed at [facility], [place] [address], [place], [place] [address]    Report Status PENDING  Incomplete  Culture, blood (routine x 2)     Status: None (Preliminary result)   Collection Time: 04/10/18  2:26 PM  Result Value Ref Range Status   Specimen Description BLOOD LEFT ANTECUBITAL  Final   Special Requests   Final    BOTTLES DRAWN AEROBIC ONLY Blood Culture results may not be optimal due to an inadequate volume of blood received in culture bottles   Culture   Final    NO GROWTH < 24 HOURS Performed at [facility], [place] [address] [place], [place] [address]    Report Status PENDING  Incomplete         Radiology Studies: Mr Thoracic Spine W Wo Contrast  Result Date: 04/11/2018 CLINICAL DATA:  Bacteremia with MRSA endocarditis.  Back pain. EXAM: MRI THORACIC AND LUMBAR SPINE WITHOUT AND WITH CONTRAST TECHNIQUE: Multiplanar and multiecho pulse sequences of the thoracic and lumbar spine were obtained without and with intravenous contrast. CONTRAST:  6mL MULTIHANCE GADOBENATE DIMEGLUMINE 529 MG/ML IV SOLN COMPARISON:  CT abdomen pelvis 07/19/2016 FINDINGS: MRI THORACIC SPINE FINDINGS Alignment:  Thoracic alignment is normal. Vertebrae: Normal bone marrow signal. Cord:  Normal Paraspinal and other soft tissues: Small pleural effusions and numerous nodular/cavitary foci throughout the lungs, better evaluated earlier chest CT. Disc levels: The intervertebral  discs levels of the thoracic spine are normal. MRI LUMBAR SPINE FINDINGS Segmentation:  Standard. Alignment: There is increased kyphosis at the L1-L2 level secondary to chronic endplate changes, unchanged. Vertebrae: Despite the below described paravertebral abnormalities, there is no appreciable marrow signal abnormality within the lumbar spine. However, in the sacrum there is extensive irregular bone marrow contrast enhancement within the left sacral ala. Conus medullaris: Extends to the L1 level and appears normal. There is a left lateral and dorsal epidural collection that extends from the lower L4 level to the caudal termination of the spinal canal. There is associated peripheral contrast enhancement. The cauda equina nerve roots are displaced and compressed at the L5 level and below. Paraspinal and other soft tissues: There are multiple paraspinal abscesses adjacent to the sacrum and L5 spinous process, in addition to inflammatory contrast enhancement of the posterior paraspinous muscles. The largest fluid collection measures approximately 1.1 x 1.0 cm. At the T12-L1 disc space, there is a lobulated fluid collection that measures approximately 2.5 x 2.7 cm in its greatest AP and CC dimensions with mild surrounding contrast enhancement. Disc levels: T12-L1: There is a fluid collection within the disc space that extends to the prevertebral space, measuring 2.5 cm in greatest AP dimension and 2.7 cm in greatest craniocaudal dimension. There is no associated bone marrow edema or appreciable endplate enhancement. L1-L2: Near complete loss  of disc space due to sequela of prior infection. No spinal canal stenosis. Moderate narrowing of the right neural foramen. L2-L3: Normal disc space and facets. No spinal canal or neuroforaminal stenosis. L3-L4: Normal disc space and facets. No spinal canal or neuroforaminal stenosis. L4-L5: Small component of epidural abscess within the left dorsal spinal canal. L5-S1: The thecal  sac is effaced by mass effect from sacral spinal canal epidural abscess. Visualized sacrum: Normal. IMPRESSION: 1. Sacral epidural abscess extending from the most caudal portion of the spinal canal superiorly to the L4 level, effacing the thecal sac at the L5 level and below, with mass effect on the lower cauda equina nerve roots. 2. 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 3. Well-defined fluid collection in the T12-L1 disc space with mild surrounding contrast enhancement, most consistent with intradiscal abscess with prevertebral extension. 4. Numerous infectious pulmonary lesions, as demonstrated on the 04/08/2018 chest CT. A contrast-enhanced chest CT might be helpful to assess for empyema or pulmonary abscess. Electronically Signed   By: Ulyses Jarred M.D.   On: 04/11/2018 15:38   Mr Lumbar Spine W Wo Contrast  Result Date: 04/11/2018 CLINICAL DATA:  Bacteremia with MRSA endocarditis.  Back pain. EXAM: MRI THORACIC AND LUMBAR SPINE WITHOUT AND WITH CONTRAST TECHNIQUE: Multiplanar and multiecho pulse sequences of the thoracic and lumbar spine were obtained without and with intravenous contrast. CONTRAST:  22mL MULTIHANCE GADOBENATE DIMEGLUMINE 529 MG/ML IV SOLN COMPARISON:  CT abdomen pelvis 07/19/2016 FINDINGS: MRI THORACIC SPINE FINDINGS Alignment:  Thoracic alignment is normal. Vertebrae: Normal bone marrow signal. Cord:  Normal Paraspinal and other soft tissues: Small pleural effusions and numerous nodular/cavitary foci throughout the lungs, better evaluated earlier chest CT. Disc levels: The intervertebral discs levels of the thoracic spine are normal. MRI LUMBAR SPINE FINDINGS Segmentation:  Standard. Alignment: There is increased kyphosis at the L1-L2 level secondary to chronic endplate changes, unchanged. Vertebrae: Despite the below described paravertebral abnormalities, there is no appreciable marrow signal abnormality within the  lumbar spine. However, in the sacrum there is extensive irregular bone marrow contrast enhancement within the left sacral ala. Conus medullaris: Extends to the L1 level and appears normal. There is a left lateral and dorsal epidural collection that extends from the lower L4 level to the caudal termination of the spinal canal. There is associated peripheral contrast enhancement. The cauda equina nerve roots are displaced and compressed at the L5 level and below. Paraspinal and other soft tissues: There are multiple paraspinal abscesses adjacent to the sacrum and L5 spinous process, in addition to inflammatory contrast enhancement of the posterior paraspinous muscles. The largest fluid collection measures approximately 1.1 x 1.0 cm. At the T12-L1 disc space, there is a lobulated fluid collection that measures approximately 2.5 x 2.7 cm in its greatest AP and CC dimensions with mild surrounding contrast enhancement. Disc levels: T12-L1: There is a fluid collection within the disc space that extends to the prevertebral space, measuring 2.5 cm in greatest AP dimension and 2.7 cm in greatest craniocaudal dimension. There is no associated bone marrow edema or appreciable endplate enhancement. L1-L2: Near complete loss of disc space due to sequela of prior infection. No spinal canal stenosis. Moderate narrowing of the right neural foramen. L2-L3: Normal disc space and facets. No spinal canal or neuroforaminal stenosis. L3-L4: Normal disc space and facets. No spinal canal or neuroforaminal stenosis. L4-L5: Small component of epidural abscess within the left dorsal spinal canal. L5-S1: The thecal sac is effaced by  mass effect from sacral spinal canal epidural abscess. Visualized sacrum: Normal. IMPRESSION: 1. Sacral epidural abscess extending from the most caudal portion of the spinal canal superiorly to the L4 level, effacing the thecal sac at the L5 level and below, with mass effect on the lower cauda equina nerve roots. 2.  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 3. Well-defined fluid collection in the T12-L1 disc space with mild surrounding contrast enhancement, most consistent with intradiscal abscess with prevertebral extension. 4. Numerous infectious pulmonary lesions, as demonstrated on the 04/08/2018 chest CT. A contrast-enhanced chest CT might be helpful to assess for empyema or pulmonary abscess. Electronically Signed   By: Ulyses Jarred M.D.   On: 04/11/2018 15:38   Dg Chest Port 1 View  Result Date: 04/11/2018 CLINICAL DATA:  Shortness of breath EXAM: PORTABLE CHEST 1 VIEW COMPARISON:  04/08/2018 FINDINGS: Cardiac shadows within normal limits. The patchy nodular changes bilaterally are again noted and stable. Persistent right basilar infiltrate is seen. No new pneumothorax is noted. No bony abnormality is seen. IMPRESSION: Overall stable appearance when compared with the prior CT examination. Electronically Signed   By: Inez Catalina M.D.   On: 04/11/2018 08:56        Scheduled Meds: . Chlorhexidine Gluconate Cloth  6 each Topical Q0600  . HYDROmorphone   Intravenous Q4H  . mupirocin ointment  1 application Nasal BID  . nicotine  14 mg Transdermal Daily  . polyethylene glycol  17 g Oral Daily  . potassium chloride  40 mEq Oral Daily  . sodium chloride flush  3 mL Intravenous Q12H   Continuous Infusions: . sodium chloride Stopped (04/09/18 1344)  . ceFTAROline (TEFLARO) IV 400 mg (04/11/18 0412)     LOS: 3 days    Time spent: 35 minutes    Edwin Dada, MD [facility] 04/11/2018, 5:29 PM     Pager [phone] --- please page though AMION:  [email or web address] If 7PM-7AM, please contact night-coverage

## 2018-04-11 NOTE — Transfer of Care (Signed)
Immediate Anesthesia Transfer of Care Note  Patient: Molly [name]  Procedure(s) Performed: MRI OF LUMBAR AND THORACIC SPINE WITHOUT CONTRAST WITH ANESTHESIA (N/A )  Patient Location: PACU  Anesthesia Type:General  Level of Consciousness: drowsy and patient cooperative  Airway & Oxygen Therapy: Patient Spontanous Breathing and Patient connected to face mask oxygen  Post-op Assessment: Report given to RN and Post -op Vital signs reviewed and stable  Post vital signs: Reviewed and stable  Last Vitals:  Vitals Value Taken Time  BP 109/75 04/11/2018  2:36 PM  Temp    Pulse 78 04/11/2018  2:39 PM  Resp 29 04/11/2018  2:39 PM  SpO2 93 % 04/11/2018  2:39 PM  Vitals shown include unvalidated device data.  Last Pain:  Vitals:   04/11/18 1031  TempSrc:   PainSc: Asleep      Patients Stated Pain Goal: 2 (68/61/68 3729)  Complications: No apparent anesthesia complications

## 2018-04-11 NOTE — Anesthesia Preprocedure Evaluation (Addendum)
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 Systolic function was  vigorous. The estimated ejection fraction was in the range of 65%  to 70%. Wall motion was normal; there were no regional wall  motion abnormalities. - Tricuspid valve: There is a 1.25 x .34 cm mobile vegetation on  the atrial side of the tricuspid valve. There was mild-moderate  regurgitation. - Pulmonary arteries: Systolic pressure was moderately increased.  PA peak pressure: 55 mm Hg (S).  Impressions:  - There was a vegetation, consistent with endocarditis  Pulmonary HTN   Neuro/Psych negative neurological ROS  negative psych ROS   GI/Hepatic negative GI ROS, (+)     substance abuse  cocaine use and IV drug use, Hepatitis -, C  Endo/Other  negative endocrine ROS  Renal/GU Renal InsufficiencyRenal diseaseHx/o AKI  negative genitourinary   Musculoskeletal Septic osteomyelitis thoracolumbar vertebrae Chronic back pain   Abdominal   Peds  Hematology  (+) anemia , Septicemia Thrombocytopenia MRSA Hx/o skin infections   Anesthesia Other Findings   Reproductive/Obstetrics                          Anesthesia Physical Anesthesia Plan  ASA: III  Anesthesia Plan: General   Post-op Pain Management:    Induction: Intravenous  PONV Risk Score and Plan: 3 and Midazolam, Ondansetron,  Dexamethasone and Treatment may vary due to age or medical condition  Airway Management Planned: LMA  Additional Equipment:   Intra-op Plan:   Post-operative Plan: Extubation in OR  Informed Consent: I have reviewed the patients History and Physical, chart, labs and discussed the procedure including the risks, benefits and alternatives for the proposed anesthesia with the patient or authorized representative who has indicated his/her understanding and acceptance.   Dental advisory given  Plan Discussed with: CRNA and Surgeon  Anesthesia Plan Comments:         Anesthesia Quick Evaluation

## 2018-04-11 NOTE — Progress Notes (Signed)
[facility]    Date of Admission:  04/07/2018   Total days of antibiotics 5        Day 2 ceftaroline           ID: Molly [name] is a 32 y.o. 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 Underwent general anesthesia in order to do mri of spine which showed sacral epidural abscess as well as changes from previous thoracic discitis.NSGY consulted. Patient c/o pain diffusely. New blister to her right index finger. Rash to lower extremities evolving Medications:  . Chlorhexidine Gluconate Cloth  6 each Topical Q0600  . HYDROmorphone   Intravenous Q4H  . mupirocin ointment  1 application Nasal BID  . nicotine  14 mg Transdermal Daily  . polyethylene glycol  17 g Oral Daily  . potassium chloride  40 mEq Oral Daily  . sodium chloride flush  3 mL Intravenous Q12H    Objective: Vital signs in last 24 hours: Temp:  [97.5 F (36.4 C)-98.3 F (36.8 C)] 98.3 F (36.8 C) (06/25 1700) Pulse Rate:  [68-81] 80 (06/25 1700) Resp:  [15-29] 28 (06/25 2044) BP: (109-116)/(72-91) 116/91 (06/25 1700) SpO2:  [93 %-98 %] 98 % (06/25 2044) Physical Exam  Constitutional:  oriented to person, place, and time. appears disheveled and well-nourished. No distress.  HENT: [place]/AT, PERRLA, no scleral icterus Mouth/Throat: Oropharynx is clear and moist. No oropharyngeal exudate.  Cardiovascular: Normal rate, regular rhythm and systolic murmur Pulmonary/Chest: Effort normal and breath sounds normal. No respiratory distress.  has no wheezes.  Abdominal: Soft. Bowel sounds are  normal.  exhibits no distension. There is no tenderness.  Lymphadenopathy: no cervical adenopathy. No axillary adenopathy Neurological: alert and oriented to person, place, and time.  Skin: Skin is warm and dry. 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 Scarring  Psychiatric: a normal mood and affect.  behavior is normal.    Lab Results Recent Labs    04/09/18 1102 04/11/18 0900  WBC 4.8 3.9*  HGB 9.5* 8.9*  HCT 29.8* 28.8*  NA 134* 136  K 3.8 4.6  CL 103 109  CO2 23 21*  BUN 77* 58*  CREATININE 3.18* 1.80*   Liver Panel Recent Labs    04/09/18 1102  PROT 6.0*  ALBUMIN 1.5*  AST 14*  ALT 10*  ALKPHOS 86  BILITOT 0.7  BILIDIR 0.2  IBILI 0.5    Microbiology: 6/24 blood cx ngtd at24hr Studies/Results: Mr Thoracic Spine W Wo Contrast  Result Date: 04/11/2018 CLINICAL DATA:  Bacteremia with MRSA endocarditis.  Back pain. EXAM: MRI THORACIC AND LUMBAR SPINE WITHOUT AND WITH CONTRAST TECHNIQUE: Multiplanar and multiecho pulse sequences of the thoracic and lumbar spine were obtained without and with intravenous contrast. CONTRAST:  24mL MULTIHANCE GADOBENATE DIMEGLUMINE 529 MG/ML IV SOLN COMPARISON:  CT abdomen pelvis 07/19/2016 FINDINGS: MRI THORACIC SPINE FINDINGS Alignment:  Thoracic alignment is normal. Vertebrae: Normal bone marrow signal. Cord:  Normal Paraspinal and other soft tissues: Small pleural effusions and numerous nodular/cavitary foci throughout the lungs, better evaluated earlier chest CT. Disc levels: The intervertebral discs levels  of the thoracic spine are normal. MRI LUMBAR SPINE FINDINGS Segmentation:  Standard. Alignment: There is increased kyphosis at the L1-L2 level secondary to chronic endplate changes, unchanged. Vertebrae: Despite the below described paravertebral abnormalities, there is no appreciable marrow signal abnormality within the lumbar spine. However, in the sacrum there is extensive irregular bone marrow contrast enhancement  within the left sacral ala. Conus medullaris: Extends to the L1 level and appears normal. There is a left lateral and dorsal epidural collection that extends from the lower L4 level to the caudal termination of the spinal canal. There is associated peripheral contrast enhancement. The cauda equina nerve roots are displaced and compressed at the L5 level and below. Paraspinal and other soft tissues: There are multiple paraspinal abscesses adjacent to the sacrum and L5 spinous process, in addition to inflammatory contrast enhancement of the posterior paraspinous muscles. The largest fluid collection measures approximately 1.1 x 1.0 cm. At the T12-L1 disc space, there is a lobulated fluid collection that measures approximately 2.5 x 2.7 cm in its greatest AP and CC dimensions with mild surrounding contrast enhancement. Disc levels: T12-L1: There is a fluid collection within the disc space that extends to the prevertebral space, measuring 2.5 cm in greatest AP dimension and 2.7 cm in greatest craniocaudal dimension. There is no associated bone marrow edema or appreciable endplate enhancement. L1-L2: Near complete loss of disc space due to sequela of prior infection. No spinal canal stenosis. Moderate narrowing of the right neural foramen. L2-L3: Normal disc space and facets. No spinal canal or neuroforaminal stenosis. L3-L4: Normal disc space and facets. No spinal canal or neuroforaminal stenosis. L4-L5: Small component of epidural abscess within the left dorsal spinal canal. L5-S1: The thecal sac is effaced by mass effect from sacral spinal canal epidural abscess. Visualized sacrum: Normal. IMPRESSION: 1. Sacral epidural abscess extending from the most caudal portion of the spinal canal superiorly to the L4 level, effacing the thecal sac at the L5 level and below, with mass effect on the lower cauda equina nerve roots. 2. Sacral osteomyelitis and overlying posterior paraspinous muscle pyomyositis with multiple small  abscesses. 3. Well-defined fluid collection in the T12-L1 disc space with mild surrounding contrast enhancement, most consistent with intradiscal abscess with prevertebral extension. 4. Numerous infectious pulmonary lesions, as demonstrated on the 04/08/2018 chest CT. A contrast-enhanced chest CT might be helpful to assess for empyema or pulmonary abscess. Electronically Signed   By: Ulyses Jarred M.D.   On: 04/11/2018 15:38   Mr Lumbar Spine W Wo Contrast  Result Date: 04/11/2018 CLINICAL DATA:  Bacteremia with MRSA endocarditis.  Back pain. EXAM: MRI THORACIC AND LUMBAR SPINE WITHOUT AND WITH CONTRAST TECHNIQUE: Multiplanar and multiecho pulse sequences of the thoracic and lumbar spine were obtained without and with intravenous contrast. CONTRAST:  31mL MULTIHANCE GADOBENATE DIMEGLUMINE 529 MG/ML IV SOLN COMPARISON:  CT abdomen pelvis 07/19/2016 FINDINGS: MRI THORACIC SPINE FINDINGS Alignment:  Thoracic alignment is normal. Vertebrae: Normal bone marrow signal. Cord:  Normal Paraspinal and other soft tissues: Small pleural effusions and numerous nodular/cavitary foci throughout the lungs, better evaluated earlier chest CT. Disc levels: The intervertebral discs levels of the thoracic spine are normal. MRI LUMBAR SPINE FINDINGS Segmentation:  Standard. Alignment: There is increased kyphosis at the L1-L2 level secondary to chronic endplate changes, unchanged. Vertebrae: Despite the below described paravertebral abnormalities, there is no appreciable marrow signal abnormality within the lumbar spine. However, in the sacrum there is extensive irregular bone marrow contrast enhancement within the left sacral ala.  Conus medullaris: Extends to the L1 level and appears normal. There is a left lateral and dorsal epidural collection that extends from the lower L4 level to the caudal termination of the spinal canal. There is associated peripheral contrast enhancement. The cauda equina nerve roots are displaced and  compressed at the L5 level and below. Paraspinal and other soft tissues: There are multiple paraspinal abscesses adjacent to the sacrum and L5 spinous process, in addition to inflammatory contrast enhancement of the posterior paraspinous muscles. The largest fluid collection measures approximately 1.1 x 1.0 cm. At the T12-L1 disc space, there is a lobulated fluid collection that measures approximately 2.5 x 2.7 cm in its greatest AP and CC dimensions with mild surrounding contrast enhancement. Disc levels: T12-L1: There is a fluid collection within the disc space that extends to the prevertebral space, measuring 2.5 cm in greatest AP dimension and 2.7 cm in greatest craniocaudal dimension. There is no associated bone marrow edema or appreciable endplate enhancement. L1-L2: Near complete loss of disc space due to sequela of prior infection. No spinal canal stenosis. Moderate narrowing of the right neural foramen. L2-L3: Normal disc space and facets. No spinal canal or neuroforaminal stenosis. L3-L4: Normal disc space and facets. No spinal canal or neuroforaminal stenosis. L4-L5: Small component of epidural abscess within the left dorsal spinal canal. L5-S1: The thecal sac is effaced by mass effect from sacral spinal canal epidural abscess. Visualized sacrum: Normal. IMPRESSION: 1. Sacral epidural abscess extending from the most caudal portion of the spinal canal superiorly to the L4 level, effacing the thecal sac at the L5 level and below, with mass effect on the lower cauda equina nerve roots. 2. 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 3. Well-defined fluid collection in the T12-L1 disc space with mild surrounding contrast enhancement, most consistent with intradiscal abscess with prevertebral extension. 4. Numerous infectious pulmonary lesions, as demonstrated on the 04/08/2018 chest CT. A contrast-enhanced chest CT might be helpful to assess for  empyema or pulmonary abscess. Electronically Signed   By: Ulyses Jarred M.D.   On: 04/11/2018 15:38   Dg Chest Port 1 View  Result Date: 04/11/2018 CLINICAL DATA:  Shortness of breath EXAM: PORTABLE CHEST 1 VIEW COMPARISON:  04/08/2018 FINDINGS: Cardiac shadows within normal limits. The patchy nodular changes bilaterally are again noted and stable. Persistent right basilar infiltrate is seen. No new pneumothorax is noted. No bony abnormality is seen. IMPRESSION: Overall stable appearance when compared with the prior CT examination. Electronically Signed   By: Inez Catalina M.D.   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 - at minimum would need IV therapy for 2 wks while hospitalized and decide how to progress further with either long acting weekly infusion plus oral therapy - agree with plan to have neurosurgery evaluate patient  TV endocarditis = continue with IV therapy. Not a surgical candidate at this time- has only mild-mod regurgitation, no signs of heart failure  - opiate dependence = agree with plan to have dr Evette Doffing assist with suboxone management  [facility] Cell: [phone] Pager: [phone]  04/11/2018 @ 4:59 pm

## 2018-04-11 NOTE — Consult Note (Signed)
Reason for Consult: Epidural abscess Referring Physician: Medicine  Molly [name] is an 32 y.o. female.  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  Patient with prior history of thoracic lumbar osteomyelitis discitis treated medically.  Patient with increased lower back pain.  No true radicular pain.  No new symptoms of numbness or weakness.  No changes incontinence.  Past Medical History:  Diagnosis Date  . Abscess of skin    buttock - most recently 2009/10  . Drug-seeking behavior   . IV drug user   . MRSA (methicillin resistant Staphylococcus aureus) infection     Past Surgical History:  Procedure Laterality Date  . IR GENERIC HISTORICAL  07/19/2016   IR LUMBAR DISC ASPIRATION W/IMG GUIDE 07/19/2016 Arne Cleveland, MD [facility]  . TEE WITHOUT CARDIOVERSION N/A 07/04/2013   Procedure: TRANSESOPHAGEAL ECHOCARDIOGRAM (TEE);  Surgeon: Larey Dresser, MD;  Location: [facility] ENDOSCOPY;  Service: Cardiovascular;  Laterality: N/A;  . TONSILLECTOMY     32 years old    Family History  Problem Relation Age of Onset  . Alcoholism Father     Social History:  reports that she has been smoking cigarettes.  She has a 3.50 pack-year smoking history. She has never used smokeless tobacco. She reports that she has current or past drug history. Drugs: IV and Oxycodone. She reports that she does not drink alcohol.  Allergies:  Allergies  Allergen Reactions  . Vancomycin Other (See Comments)    Possible contributor to AKI and thrombocytopenia    Medications: I have reviewed the patient's current medications.  Results for orders placed or performed during the hospital encounter of 04/07/18 (from the past 48 hour(s))  Culture, blood (routine x 2)     Status: None (Preliminary result)   Collection Time: 04/10/18  2:20 PM  Result Value Ref Range   Specimen Description BLOOD RIGHT ANTECUBITAL    Special Requests       BOTTLES DRAWN AEROBIC ONLY Blood Culture results may not be optimal due to an inadequate volume of blood received in culture bottles   Culture      NO GROWTH < 24 HOURS Performed at [facility] [address], [place], [place] [address]    Report Status PENDING   Culture, blood (routine x 2)     Status: None (Preliminary result)   Collection Time: 04/10/18  2:26 PM  Result Value Ref Range   Specimen Description BLOOD LEFT ANTECUBITAL    Special Requests      BOTTLES DRAWN AEROBIC ONLY Blood Culture results may not be optimal due to an inadequate volume of blood received in culture bottles   Culture      NO GROWTH < 24 HOURS Performed at [facility] [address], [place], [place] [address]    Report Status PENDING   Basic metabolic panel     Status: Abnormal   Collection Time: 04/11/18  9:00 AM  Result Value Ref Range   Sodium 136 135 - 145 mmol/L   Potassium 4.6 3.5 - 5.1 mmol/L    Comment: SLIGHT HEMOLYSIS   Chloride 109 98 - 111 mmol/L    Comment: Please note change in reference range.   CO2 21 (L) 22 - 32 mmol/L   Glucose, Bld 109 (H) 70 - 99 mg/dL    Comment: Please note change in reference range.   BUN 58 (H) 6 - 20 mg/dL  Comment: Please note change in reference range.   Creatinine, Ser 1.80 (H) 0.44 - 1.00 mg/dL   Calcium 7.6 (L) 8.9 - 10.3 mg/dL   GFR calc non Af Amer 37 (L) >60 mL/min   GFR calc Af Amer 43 (L) >60 mL/min    Comment: (NOTE) The eGFR has been calculated using the CKD EPI equation. This calculation has not been validated in all clinical situations. eGFR's persistently <60 mL/min signify possible Chronic Kidney Disease.    Anion gap 6 5 - 15    Comment: Performed at [facility] [address], [place], [place] [address]  CBC     Status: Abnormal   Collection Time: 04/11/18  9:00 AM  Result Value Ref Range   WBC 3.9 (L) 4.0 - 10.5 K/uL   RBC 3.43 (L) 3.87 - 5.11 MIL/uL   Hemoglobin 8.9 (L) 12.0 - 15.0 g/dL   HCT 28.8  (L) 36.0 - 46.0 %   MCV 84.0 78.0 - 100.0 fL   MCH 25.9 (L) 26.0 - 34.0 pg   MCHC 30.9 30.0 - 36.0 g/dL   RDW 18.2 (H) 11.5 - 15.5 %   Platelets 126 (L) 150 - 400 K/uL    Comment: Performed at [facility], [place] [address], [place], [place] [address]  Brain natriuretic peptide     Status: Abnormal   Collection Time: 04/11/18  9:00 AM  Result Value Ref Range   B Natriuretic Peptide 518.4 (H) 0.0 - 100.0 pg/mL    Comment: Performed at [facility] [address], [place], [place] [address]    Mr Thoracic Spine W Wo Contrast  Result Date: 04/11/2018 CLINICAL DATA:  Bacteremia with MRSA endocarditis.  Back pain. EXAM: MRI THORACIC AND LUMBAR SPINE WITHOUT AND WITH CONTRAST TECHNIQUE: Multiplanar and multiecho pulse sequences of the thoracic and lumbar spine were obtained without and with intravenous contrast. CONTRAST:  39m MULTIHANCE GADOBENATE DIMEGLUMINE 529 MG/ML IV SOLN COMPARISON:  CT abdomen pelvis 07/19/2016 FINDINGS: MRI THORACIC SPINE FINDINGS Alignment:  Thoracic alignment is normal. Vertebrae: Normal bone marrow signal. Cord:  Normal Paraspinal and other soft tissues: Small pleural effusions and numerous nodular/cavitary foci throughout the lungs, better evaluated earlier chest CT. Disc levels: The intervertebral discs levels of the thoracic spine are normal. MRI LUMBAR SPINE FINDINGS Segmentation:  Standard. Alignment: There is increased kyphosis at the L1-L2 level secondary to chronic endplate changes, unchanged. Vertebrae: Despite the below described paravertebral abnormalities, there is no appreciable marrow signal abnormality within the lumbar spine. However, in the sacrum there is extensive irregular bone marrow contrast enhancement within the left sacral ala. Conus medullaris: Extends to the L1 level and appears normal. There is a left lateral and dorsal epidural collection that extends from the lower L4 level to the caudal termination of the spinal canal. There is  associated peripheral contrast enhancement. The cauda equina nerve roots are displaced and compressed at the L5 level and below. Paraspinal and other soft tissues: There are multiple paraspinal abscesses adjacent to the sacrum and L5 spinous process, in addition to inflammatory contrast enhancement of the posterior paraspinous muscles. The largest fluid collection measures approximately 1.1 x 1.0 cm. At the T12-L1 disc space, there is a lobulated fluid collection that measures approximately 2.5 x 2.7 cm in its greatest AP and CC dimensions with mild surrounding contrast enhancement. Disc levels: T12-L1: There is a fluid collection within the disc space that extends to the prevertebral space, measuring 2.5 cm in greatest  AP dimension and 2.7 cm in greatest craniocaudal dimension. There is no associated bone marrow edema or appreciable endplate enhancement. L1-L2: Near complete loss of disc space due to sequela of prior infection. No spinal canal stenosis. Moderate narrowing of the right neural foramen. L2-L3: Normal disc space and facets. No spinal canal or neuroforaminal stenosis. L3-L4: Normal disc space and facets. No spinal canal or neuroforaminal stenosis. L4-L5: Small component of epidural abscess within the left dorsal spinal canal. L5-S1: The thecal sac is effaced by mass effect from sacral spinal canal epidural abscess. Visualized sacrum: Normal. IMPRESSION: 1. Sacral epidural abscess extending from the most caudal portion of the spinal canal superiorly to the L4 level, effacing the thecal sac at the L5 level and below, with mass effect on the lower cauda equina nerve roots. 2. 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 3. Well-defined fluid collection in the T12-L1 disc space with mild surrounding contrast enhancement, most consistent with intradiscal abscess with prevertebral extension. 4. Numerous infectious pulmonary lesions, as demonstrated  on the 04/08/2018 chest CT. A contrast-enhanced chest CT might be helpful to assess for empyema or pulmonary abscess. Electronically Signed   By: Ulyses Jarred M.D.   On: 04/11/2018 15:38   Mr Lumbar Spine W Wo Contrast  Result Date: 04/11/2018 CLINICAL DATA:  Bacteremia with MRSA endocarditis.  Back pain. EXAM: MRI THORACIC AND LUMBAR SPINE WITHOUT AND WITH CONTRAST TECHNIQUE: Multiplanar and multiecho pulse sequences of the thoracic and lumbar spine were obtained without and with intravenous contrast. CONTRAST:  47m MULTIHANCE GADOBENATE DIMEGLUMINE 529 MG/ML IV SOLN COMPARISON:  CT abdomen pelvis 07/19/2016 FINDINGS: MRI THORACIC SPINE FINDINGS Alignment:  Thoracic alignment is normal. Vertebrae: Normal bone marrow signal. Cord:  Normal Paraspinal and other soft tissues: Small pleural effusions and numerous nodular/cavitary foci throughout the lungs, better evaluated earlier chest CT. Disc levels: The intervertebral discs levels of the thoracic spine are normal. MRI LUMBAR SPINE FINDINGS Segmentation:  Standard. Alignment: There is increased kyphosis at the L1-L2 level secondary to chronic endplate changes, unchanged. Vertebrae: Despite the below described paravertebral abnormalities, there is no appreciable marrow signal abnormality within the lumbar spine. However, in the sacrum there is extensive irregular bone marrow contrast enhancement within the left sacral ala. Conus medullaris: Extends to the L1 level and appears normal. There is a left lateral and dorsal epidural collection that extends from the lower L4 level to the caudal termination of the spinal canal. There is associated peripheral contrast enhancement. The cauda equina nerve roots are displaced and compressed at the L5 level and below. Paraspinal and other soft tissues: There are multiple paraspinal abscesses adjacent to the sacrum and L5 spinous process, in addition to inflammatory contrast enhancement of the posterior paraspinous muscles.  The largest fluid collection measures approximately 1.1 x 1.0 cm. At the T12-L1 disc space, there is a lobulated fluid collection that measures approximately 2.5 x 2.7 cm in its greatest AP and CC dimensions with mild surrounding contrast enhancement. Disc levels: T12-L1: There is a fluid collection within the disc space that extends to the prevertebral space, measuring 2.5 cm in greatest AP dimension and 2.7 cm in greatest craniocaudal dimension. There is no associated bone marrow edema or appreciable endplate enhancement. L1-L2: Near complete loss of disc space due to sequela of prior infection. No spinal canal stenosis. Moderate narrowing of the right neural foramen. L2-L3: Normal disc space and facets. No spinal canal or neuroforaminal stenosis. L3-L4: Normal disc space and facets. No  spinal canal or neuroforaminal stenosis. L4-L5: Small component of epidural abscess within the left dorsal spinal canal. L5-S1: The thecal sac is effaced by mass effect from sacral spinal canal epidural abscess. Visualized sacrum: Normal. IMPRESSION: 1. Sacral epidural abscess extending from the most caudal portion of the spinal canal superiorly to the L4 level, effacing the thecal sac at the L5 level and below, with mass effect on the lower cauda equina nerve roots. 2. 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 3. Well-defined fluid collection in the T12-L1 disc space with mild surrounding contrast enhancement, most consistent with intradiscal abscess with prevertebral extension. 4. Numerous infectious pulmonary lesions, as demonstrated on the 04/08/2018 chest CT. A contrast-enhanced chest CT might be helpful to assess for empyema or pulmonary abscess. Electronically Signed   By: Ulyses Jarred M.D.   On: 04/11/2018 15:38   Dg Chest Port 1 View  Result Date: 04/11/2018 CLINICAL DATA:  Shortness of breath EXAM: PORTABLE CHEST 1 VIEW COMPARISON:  04/08/2018 FINDINGS:  Cardiac shadows within normal limits. The patchy nodular changes bilaterally are again noted and stable. Persistent right basilar infiltrate is seen. No new pneumothorax is noted. No bony abnormality is seen. IMPRESSION: Overall stable appearance when compared with the prior CT examination. Electronically Signed   By: Inez Catalina M.D.   On: 04/11/2018 08:56    Pertinent items noted in HPI and remainder of comprehensive ROS otherwise negative. Blood pressure (!) 116/91, pulse 80, temperature 98.3 F (36.8 C), temperature source Oral, resp. rate 15, height 5' 1" (1.549 m), weight 49.9 kg (110 lb 0.2 oz), SpO2 93 %. Patient is awake and alert.  She does not appear to be in any current distress.  She has some lower lumbar tenderness.  Straight leg raising is equivocal bilaterally.  Motor examination intact bilaterally with normal dorsiflexion, plantarflexion and toe flexors bilaterally.  Sensory examination intact.  No sacral numbness.  Reflexes normal.  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  I recommend continued IV antibiotic treatment.  No indication for surgical intervention.  I will follow.  Cooper Render Iqra Rotundo 04/11/2018, 7:38 PM

## 2018-04-12 ENCOUNTER — Encounter (HOSPITAL_COMMUNITY): Payer: Self-pay | Admitting: Radiology

## 2018-04-12 DIAGNOSIS — M4628 Osteomyelitis of vertebra, sacral and sacrococcygeal region: Secondary | ICD-10-CM

## 2018-04-12 DIAGNOSIS — N170 Acute kidney failure with tubular necrosis: Secondary | ICD-10-CM

## 2018-04-12 DIAGNOSIS — F1920 Other psychoactive substance dependence, uncomplicated: Secondary | ICD-10-CM

## 2018-04-12 DIAGNOSIS — N179 Acute kidney failure, unspecified: Secondary | ICD-10-CM

## 2018-04-12 DIAGNOSIS — G062 Extradural and subdural abscess, unspecified: Secondary | ICD-10-CM

## 2018-04-12 LAB — BASIC METABOLIC PANEL
Anion gap: 5 (ref 5–15)
BUN: 51 mg/dL — AB (ref 6–20)
CO2: 22 mmol/L (ref 22–32)
CREATININE: 1.54 mg/dL — AB (ref 0.44–1.00)
Calcium: 7.7 mg/dL — ABNORMAL LOW (ref 8.9–10.3)
Chloride: 107 mmol/L (ref 98–111)
GFR calc Af Amer: 51 mL/min — ABNORMAL LOW (ref >60)
GFR, EST NON AFRICAN AMERICAN: 44 mL/min — AB (ref >60)
Glucose, Bld: 98 mg/dL (ref 70–99)
POTASSIUM: 4.8 mmol/L (ref 3.5–5.1)
SODIUM: 134 mmol/L — AB (ref 135–145)

## 2018-04-12 LAB — CBC
HCT: 30.7 % — ABNORMAL LOW (ref 36.0–46.0)
Hemoglobin: 9.5 g/dL — ABNORMAL LOW (ref 12.0–15.0)
MCH: 25.7 pg — ABNORMAL LOW (ref 26.0–34.0)
MCHC: 30.9 g/dL (ref 30.0–36.0)
MCV: 83 fL (ref 78.0–100.0)
Platelets: 118 10*3/uL — ABNORMAL LOW (ref 150–400)
RBC: 3.7 MIL/uL — ABNORMAL LOW (ref 3.87–5.11)
RDW: 18 % — AB (ref 11.5–15.5)
WBC: 4.5 10*3/uL (ref 4.0–10.5)

## 2018-04-12 LAB — ANCA TITERS
Atypical P-ANCA titer: 1:320 {titer} — ABNORMAL HIGH
C-ANCA: <1:20 {titer}

## 2018-04-12 NOTE — Progress Notes (Signed)
Patient ID: Molly [name], female   DOB: 03-Sep-1986, 32 y.o.   MRN: [id number]  This NP visited patient at the bedside as a follow up for palliative medicine needs and emotional support.  Molly [name] is easily agitated and has no interest in talking about her current medical or emotional situation.   Her mother asks to talk outside the room and [name] tells [place] "to go an just talk to her"  Listened as mother shares her worries and fear regarding overall situation.  She understands the seriousness of the current medical situation.  She speaks to to complexity of long term addiction of both her daughter/drug and her husband/ETOH.  Emotional support offered, suggested Al-Non.  Mother/Molly [name] tells me she has no interest she "knows how to handle things"    Emotional support offered.  Questions and concerns addressed    Recommended securing HPOA while hospitalized   Discussed with attending the importance of and consideration of drug/ETOH support and rehab in overall treatment plan     Total time spent on the unit was 40 minutes  Greater than 50% of the time was spent in counseling and coordination of care  Wadie Lessen NP  Palliative Medicine Team Team Phone # [phone] Pager [phone]

## 2018-04-12 NOTE — Progress Notes (Signed)
[facility]  Date of Admission:  04/07/2018     Total days of antibiotics 6         ASSESSMENT/PLAN  Molly [name] is currently on Day 6 of antimicrobial therapy an Day 3 of ceftaroline for disseminated MRSA infection and bacteremia. Repeat blood cultures remain positive which is not unexpected given the burden of infection. 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 Pain control remains a concern and now on PCA pump per primary team. IMTS evaluated for Suboxone and awaiting resolution of acute pain. Neurosurgery evaluated with no surgical intervention at this time.   1. Continue current dosage of Ceftaroline. Will monitor kidney function with possible return to vancomycin pending normalization of kidney function.  2. Recheck blood cultures. No central venous access until blood infection has cleared. 3. Treatment duration a minimum of 8 weeks of therapy and then likely will need suppressive therapy. 4. May consider long acting medication such as oritivancin or delbavancin if the need arises.   Principal Problem:   Severe sepsis ([place]) Active Problems:   Septic pulmonary embolism (HCC)   Intravenous drug abuse, continuous (HCC)   Pancytopenia (HCC)   AKI (acute kidney injury) ([place])   Goals of care, counseling/discussion   Hyponatremia   Acute bacterial endocarditis   Chronic thoracic back pain   Palliative care by specialist   . Chlorhexidine Gluconate Cloth  6 each Topical Q0600  . HYDROmorphone   Intravenous Q4H  . mupirocin ointment  1 application Nasal BID  . nicotine  14 mg Transdermal Daily  . polyethylene glycol  17 g Oral Daily  . potassium chloride  40 mEq Oral Daily  . sodium chloride flush  3 mL Intravenous Q12H    SUBJECTIVE:  MRI completed with sedation yesterday showing epidural abscess, sacral osteomyelitis, and a fluid collection around T12-L1. Thoracic spine MRI with numerous infectious pulmonary lesions. Kidney function  continues to improve slightly. Repeat blood cultures positive for MRSA. Continues to have pain located in her back and now on PCA. Neurosurgery evaluated with no surgical intervention.   Allergies  Allergen Reactions  . Vancomycin Other (See Comments)    Possible contributor to AKI and thrombocytopenia     Review of Systems: Review of Systems  Constitutional: Negative for chills and fever.  Respiratory: Negative for cough, sputum production, shortness of breath and wheezing.   Cardiovascular: Negative for chest pain and leg swelling.  Gastrointestinal: Negative for abdominal pain, constipation, diarrhea, nausea and vomiting.  Genitourinary: Negative for dysuria, frequency, hematuria and urgency.  Musculoskeletal: Positive for back pain.      OBJECTIVE: Vitals:   04/12/18 0838 04/12/18 1133 04/12/18 1135 04/12/18 1357  BP:    119/89  Pulse:    60  Resp:  (!) 22  18  Temp: 98.4 F (36.9 C)  97.6 F (36.4 C) (!) 97.5 F (36.4 C)  TempSrc: Oral  Oral Oral  SpO2:  93%  98%  Weight:      Height:       Body mass index is 20.79 kg/m.  Physical Exam  Constitutional: Molly [name] is oriented to person, place, and time. Molly [name] appears well-developed and well-nourished. Molly [name] appears ill. No distress.  Cardiovascular: Normal rate, regular rhythm and intact distal pulses. Exam reveals no gallop and no friction rub.  Murmur heard. Pulmonary/Chest: Effort normal and breath sounds normal. No stridor. No respiratory distress. Molly [name] has no wheezes. Molly [name] has no rales. Molly [name] exhibits no tenderness.  Abdominal: Soft. Bowel  sounds are normal.  Neurological: Molly [name] is oriented to person, place, and time.  Skin: Skin is warm and dry.  Psychiatric: Molly [name] has a normal mood and affect.    Lab Results Lab Results  Component Value Date   WBC 4.5 04/12/2018   HGB 9.5 (L) 04/12/2018   HCT 30.7 (L) 04/12/2018   MCV 83.0 04/12/2018   PLT 118 (L) 04/12/2018    Lab Results  Component Value Date   CREATININE 1.54  (H) 04/12/2018   BUN 51 (H) 04/12/2018   NA 134 (L) 04/12/2018   K 4.8 04/12/2018   CL 107 04/12/2018   CO2 22 04/12/2018    Lab Results  Component Value Date   ALT 10 (L) 04/09/2018   AST 14 (L) 04/09/2018   ALKPHOS 86 04/09/2018   BILITOT 0.7 04/09/2018     Microbiology: Recent Results (from the past 240 hour(s))  Blood culture (routine x 2)     Status: Abnormal   Collection Time: 04/07/18 10:22 PM  Result Value Ref Range Status   Specimen Description BLOOD LEFT FOREARM  Final   Special Requests   Final    BOTTLES DRAWN AEROBIC AND ANAEROBIC Blood Culture results may not be optimal due to an inadequate volume of blood received in culture bottles   Culture  Setup Time   Final    GRAM POSITIVE COCCI IN BOTH AEROBIC AND ANAEROBIC BOTTLES CRITICAL RESULT CALLED TO, READ BACK BY AND VERIFIED WITH: PHARMD E Custer 04/08/18 AR 1316 BY CM Performed at [facility], [address] [place], [place] [address]    Culture METHICILLIN RESISTANT STAPHYLOCOCCUS AUREUS (A)  Final   Report Status 04/10/2018 FINAL  Final   Organism ID, Bacteria METHICILLIN RESISTANT STAPHYLOCOCCUS AUREUS  Final      Susceptibility   Methicillin resistant staphylococcus aureus - MIC*    CIPROFLOXACIN <=0.5 SENSITIVE Sensitive     ERYTHROMYCIN >=8 RESISTANT Resistant     GENTAMICIN <=0.5 SENSITIVE Sensitive     OXACILLIN >=4 RESISTANT Resistant     TETRACYCLINE <=1 SENSITIVE Sensitive     VANCOMYCIN 1 SENSITIVE Sensitive     TRIMETH/SULFA <=10 SENSITIVE Sensitive     CLINDAMYCIN <=0.25 SENSITIVE Sensitive     RIFAMPIN <=0.5 SENSITIVE Sensitive     Inducible Clindamycin NEGATIVE Sensitive     * METHICILLIN RESISTANT STAPHYLOCOCCUS AUREUS  Blood Culture ID Panel (Reflexed)     Status: Abnormal   Collection Time: 04/07/18 10:22 PM  Result Value Ref Range Status   Enterococcus species NOT DETECTED NOT DETECTED Final   Listeria monocytogenes NOT DETECTED NOT DETECTED Final   Staphylococcus species  DETECTED (A) NOT DETECTED Final    Comment: CRITICAL RESULT CALLED TO, READ BACK BY AND VERIFIED WITH: PHARMD E SINCLEAR 04/08/18 AT 1319 BU CM    Staphylococcus aureus DETECTED (A) NOT DETECTED Final    Comment: Methicillin (oxacillin)-resistant Staphylococcus aureus (MRSA). MRSA is predictably resistant to beta-lactam antibiotics (except ceftaroline). Preferred therapy is vancomycin unless clinically contraindicated. Patient requires contact precautions if  hospitalized. CRITICAL RESULT CALLED TO, READ BACK BY AND VERIFIED WITH: PHARMD E SINCLEAR 04/08/18 AT 1319 BY CM    Methicillin resistance DETECTED (A) NOT DETECTED Final    Comment: CRITICAL RESULT CALLED TO, READ BACK BY AND VERIFIED WITH: PHARMD E SINCLEAR 04/08/18 AT 1319 BY CM    Streptococcus species NOT DETECTED NOT DETECTED Final   Streptococcus agalactiae NOT DETECTED NOT DETECTED Final   Streptococcus pneumoniae NOT DETECTED NOT DETECTED Final  Streptococcus pyogenes NOT DETECTED NOT DETECTED Final   Acinetobacter baumannii NOT DETECTED NOT DETECTED Final   Enterobacteriaceae species NOT DETECTED NOT DETECTED Final   Enterobacter cloacae complex NOT DETECTED NOT DETECTED Final   Escherichia coli NOT DETECTED NOT DETECTED Final   Klebsiella oxytoca NOT DETECTED NOT DETECTED Final   Klebsiella pneumoniae NOT DETECTED NOT DETECTED Final   Proteus species NOT DETECTED NOT DETECTED Final   Serratia marcescens NOT DETECTED NOT DETECTED Final   Haemophilus influenzae NOT DETECTED NOT DETECTED Final   Neisseria meningitidis NOT DETECTED NOT DETECTED Final   Pseudomonas aeruginosa NOT DETECTED NOT DETECTED Final   Candida albicans NOT DETECTED NOT DETECTED Final   Candida glabrata NOT DETECTED NOT DETECTED Final   Candida krusei NOT DETECTED NOT DETECTED Final   Candida parapsilosis NOT DETECTED NOT DETECTED Final   Candida tropicalis NOT DETECTED NOT DETECTED Final    Comment: Performed at [facility], [place] [address], [place], [place] [address]  Blood culture (routine x 2)     Status: Abnormal   Collection Time: 04/07/18 10:35 PM  Result Value Ref Range Status   Specimen Description BLOOD RIGHT ANTECUBITAL  Final   Special Requests   Final    BOTTLES DRAWN AEROBIC AND ANAEROBIC Blood Culture results may not be optimal due to an inadequate volume of blood received in culture bottles   Culture  Setup Time   Final    GRAM POSITIVE COCCI IN BOTH AEROBIC AND ANAEROBIC BOTTLES CRITICAL VALUE NOTED.  VALUE IS CONSISTENT WITH PREVIOUSLY REPORTED AND CALLED VALUE.    Culture (A)  Final    STAPHYLOCOCCUS AUREUS SUSCEPTIBILITIES PERFORMED ON PREVIOUS CULTURE WITHIN THE LAST 5 DAYS. Performed at [facility], [place] [address], [place], [place] [address]    Report Status 04/10/2018 FINAL  Final  Culture, blood (single)     Status: Abnormal   Collection Time: 04/07/18 11:08 PM  Result Value Ref Range Status   Specimen Description BLOOD RIGHT HAND  Final   Special Requests   Final    BOTTLES DRAWN AEROBIC AND ANAEROBIC Blood Culture results may not be optimal due to an inadequate volume of blood received in culture bottles   Culture  Setup Time   Final    GRAM POSITIVE COCCI IN BOTH AEROBIC AND ANAEROBIC BOTTLES CRITICAL VALUE NOTED.  VALUE IS CONSISTENT WITH PREVIOUSLY REPORTED AND CALLED VALUE.    Culture (A)  Final    STAPHYLOCOCCUS AUREUS SUSCEPTIBILITIES PERFORMED ON PREVIOUS CULTURE WITHIN THE LAST 5 DAYS. Performed at [facility], [place] [address], [place], [place] [address]    Report Status 04/10/2018 FINAL  Final  MRSA PCR Screening     Status: Abnormal   Collection Time: 04/08/18  2:27 AM  Result Value Ref Range Status   MRSA by PCR POSITIVE (A) NEGATIVE Final    Comment:        The GeneXpert MRSA Assay (FDA approved for NASAL specimens only), is one component of a comprehensive MRSA colonization surveillance program. It is not intended to diagnose MRSA infection nor to guide  or monitor treatment for MRSA infections. RESULT CALLED TO, READ BACK BY AND VERIFIED WITHParks Ranger RN 04/08/18 0801 JDW   Culture, blood (routine x 2)     Status: None (Preliminary result)   Collection Time: 04/10/18  2:20 PM  Result Value Ref Range Status   Specimen Description BLOOD RIGHT ANTECUBITAL  Final   Special Requests   Final  BOTTLES DRAWN AEROBIC ONLY Blood Culture results may not be optimal due to an inadequate volume of blood received in culture bottles   Culture  Setup Time   Final    GRAM POSITIVE COCCI AEROBIC BOTTLE ONLY CRITICAL RESULT CALLED TO, READ BACK BY AND VERIFIED WITH: PHARMD M AMCCIE 04/12/18 AT 1217 BY CM CRITICAL VALUE NOTED.  VALUE IS CONSISTENT WITH PREVIOUSLY REPORTED AND CALLED VALUE. Performed at [facility], [place] [address], [place], [place] [address]    Culture GRAM POSITIVE COCCI  Final   Report Status PENDING  Incomplete  Culture, blood (routine x 2)     Status: None (Preliminary result)   Collection Time: 04/10/18  2:26 PM  Result Value Ref Range Status   Specimen Description BLOOD LEFT ANTECUBITAL  Final   Special Requests   Final    BOTTLES DRAWN AEROBIC ONLY Blood Culture results may not be optimal due to an inadequate volume of blood received in culture bottles   Culture   Final    NO GROWTH 2 DAYS Performed at [facility], [place] [address] [place], [place] [address]    Report Status PENDING  Incomplete     Terri Piedra, [place] for [place] Pager  04/12/2018  3:12 PM

## 2018-04-12 NOTE — Progress Notes (Signed)
Called to room by [facility] RN Stated patient wanting to leave. Upon entering the room Patient was requesting to go outside and smoke. I explained our smoking policy and explained that we cant let her go with the PCA pump. offer a larger smoking Patch and she refused. She then asked for [place] to remove her IV and telemetry so she could leave. I then explained the risk of her leaving and not getting treatment Leading up to death. She understands tor return to the ED if she is not feeling well. IV removed and wasted Dilaudid 25mg   with [place]. MD notified dr Allyson Sabal.

## 2018-04-12 NOTE — Progress Notes (Signed)
PHARMACY - PHYSICIAN COMMUNICATION CRITICAL VALUE ALERT - BLOOD CULTURE IDENTIFICATION (BCID)  Molly [name] is an 32 y.o. female who presented to [facility] on 04/07/2018   1/2 repeat bcx positive for gpc No bcid since done on 6/21  Results for orders placed or performed during the hospital encounter of 04/07/18  Blood Culture ID Panel (Reflexed) (Collected: 04/07/2018 10:22 PM)  Result Value Ref Range   Enterococcus species NOT DETECTED NOT DETECTED   Listeria monocytogenes NOT DETECTED NOT DETECTED   Staphylococcus species DETECTED (A) NOT DETECTED   Staphylococcus aureus DETECTED (A) NOT DETECTED   Methicillin resistance DETECTED (A) NOT DETECTED   Streptococcus species NOT DETECTED NOT DETECTED   Streptococcus agalactiae NOT DETECTED NOT DETECTED   Streptococcus pneumoniae NOT DETECTED NOT DETECTED   Streptococcus pyogenes NOT DETECTED NOT DETECTED   Acinetobacter baumannii NOT DETECTED NOT DETECTED   Enterobacteriaceae species NOT DETECTED NOT DETECTED   Enterobacter cloacae complex NOT DETECTED NOT DETECTED   Escherichia coli NOT DETECTED NOT DETECTED   Klebsiella oxytoca NOT DETECTED NOT DETECTED   Klebsiella pneumoniae NOT DETECTED NOT DETECTED   Proteus species NOT DETECTED NOT DETECTED   Serratia marcescens NOT DETECTED NOT DETECTED   Haemophilus influenzae NOT DETECTED NOT DETECTED   Neisseria meningitidis NOT DETECTED NOT DETECTED   Pseudomonas aeruginosa NOT DETECTED NOT DETECTED   Candida albicans NOT DETECTED NOT DETECTED   Candida glabrata NOT DETECTED NOT DETECTED   Candida krusei NOT DETECTED NOT DETECTED   Candida parapsilosis NOT DETECTED NOT DETECTED   Candida tropicalis NOT DETECTED NOT DETECTED   Levester Fresh, PharmD, BCPS, BCCCP Clinical Pharmacist Clinical phone for 04/12/2018 from 7a-3:30p: [phone] If after 3:30p, please call main pharmacy at: [phone] 04/12/2018 12:21 PM

## 2018-04-12 NOTE — Discharge Summary (Signed)
Physician Discharge Summary  Molly [name] MRN: [id number] DOB/AGE: Jan 17, 1986 32 y.o.  PCP: Patient, No Pcp Per   Admit date: 04/07/2018 Discharge date: 04/12/2018  Discharge Diagnoses:    Principal Problem:   Severe sepsis [facility]) Active Problems:   Septic pulmonary embolism (HCC)   Intravenous drug abuse, continuous (HCC)   Pancytopenia (HCC)   AKI (acute kidney injury) ([place])   Goals of care, counseling/discussion   Hyponatremia   Acute bacterial endocarditis   Chronic thoracic back pain   Palliative care by specialist   Acute renal failure (ARF) ([place])    Follow-up recommendations Patient left AMA  Patient insisted to go out and smoke while she was on a PCA pump.   nicotine patch was offered and was refused Patient then requested to remove IV and telemetry and she left She understood the risks of impending  Death if she deteriorated        Discharge Condition: Prognosis poor  Discharge Instructions Get Medicines reviewed and adjusted: Please take all your medications with you for your next visit with your Primary MD  Please request your Primary MD to go over all hospital tests and procedure/radiological results at the follow up, please ask your Primary MD to get all Hospital records sent to his/her office.  If you experience worsening of your admission symptoms, develop shortness of breath, life threatening emergency, suicidal or homicidal thoughts you must seek medical attention immediately by calling 911 or calling your MD immediately  if symptoms less severe.  You must read complete instructions/literature along with all the possible adverse reactions/side effects for all the Medicines you take and that have been prescribed to you. Take any new Medicines after you have completely understood and accpet all the possible adverse reactions/side effects.   Do not drive when taking Pain medications.   Do not take more than prescribed Pain, Sleep and Anxiety  Medications  Special Instructions: If you have smoked or chewed Tobacco  in the last 2 yrs please stop smoking, stop any regular Alcohol  and or any Recreational drug use.  Wear Seat belts while driving.  Please note  You were cared for by a hospitalist during your hospital stay. Once you are discharged, your primary care physician will handle any further medical issues. Please note that NO REFILLS for any discharge medications will be authorized once you are discharged, as it is imperative that you return to your primary care physician (or establish a relationship with a primary care physician if you do not have one) for your aftercare needs so that they can reassess your need for medications and monitor your lab values.     Allergies  Allergen Reactions  . Vancomycin Other (See Comments)    Possible contributor to AKI and thrombocytopenia      Disposition:    Consults:  Consultants:   Nephrology  Palliative Care  Cardiology  Infectious disease  Neurosurgery      Significant Diagnostic Studies:  Dg Chest 2 View  Result Date: 04/07/2018 CLINICAL DATA:  Shortness of breath, bilateral lower extremity swelling. EXAM: CHEST - 2 VIEW COMPARISON:  Radiograph and CT scan of Mar 03, 2018. FINDINGS: The heart size and mediastinal contours are within normal limits. No pneumothorax or pleural effusion is noted. Multiple rounded patchy opacities are seen throughout both lungs concerning for multifocal pneumonia. Right basilar subsegmental atelectasis is noted. The visualized skeletal structures are unremarkable. IMPRESSION: Multiple rounded patchy opacities are noted throughout both lungs most consistent with multifocal pneumonia. Mild  right basilar subsegmental atelectasis is noted. Electronically Signed   By: Marijo Conception, M.D.   On: 04/07/2018 21:45   Ct Chest Wo Contrast  Result Date: 04/08/2018 CLINICAL DATA:  Shortness of breath. History of IV drug use. Concern for septic  emboli based on chest x-ray. EXAM: CT CHEST WITHOUT CONTRAST TECHNIQUE: Multidetector CT imaging of the chest was performed following the standard protocol without IV contrast. COMPARISON:  CTA chest dated Mar 03, 2018. FINDINGS: Cardiovascular: Normal heart size. No pericardial effusion. Normal caliber thoracic aorta. Mediastinum/Nodes: Subcentimeter mediastinal lymph nodes are likely reactive. No enlarged axillary lymph nodes. Thyroid gland, trachea, and esophagus demonstrate no significant findings. Lungs/Pleura: New scattered nodular opacities throughout both lungs, some of which demonstrate cavitation, consistent with septic emboli. The largest nodules measure up to 1.9 cm. More confluent consolidation within the right lower lobe. Focal bronchiectasis and scarring in the anterior right upper lobe likely related to now resolved pneumonia seen on prior CT. Mild diffuse interlobular septal thickening. Small right greater than left pleural effusions. Mild left lower lobe subsegmental atelectasis. Upper Abdomen: No acute abnormality. Musculoskeletal: No acute or significant osseous findings. Old right-sided rib fractures. Moderate disc height loss at T5-T6, unchanged. IMPRESSION: 1. New scattered nodular opacities throughout both lungs, some of which demonstrate cavitation, consistent with septic emboli. 2. More confluent consolidation within the right lower lobe may reflect pneumonia versus atelectasis. 3. Small right greater than left pleural effusions. Mild interstitial pulmonary edema. Electronically Signed   By: Titus Dubin M.D.   On: 04/08/2018 19:18   Mr Thoracic Spine W Wo Contrast  Result Date: 04/11/2018 CLINICAL DATA:  Bacteremia with MRSA endocarditis.  Back pain. EXAM: MRI THORACIC AND LUMBAR SPINE WITHOUT AND WITH CONTRAST TECHNIQUE: Multiplanar and multiecho pulse sequences of the thoracic and lumbar spine were obtained without and with intravenous contrast. CONTRAST:  23m MULTIHANCE  GADOBENATE DIMEGLUMINE 529 MG/ML IV SOLN COMPARISON:  CT abdomen pelvis 07/19/2016 FINDINGS: MRI THORACIC SPINE FINDINGS Alignment:  Thoracic alignment is normal. Vertebrae: Normal bone marrow signal. Cord:  Normal Paraspinal and other soft tissues: Small pleural effusions and numerous nodular/cavitary foci throughout the lungs, better evaluated earlier chest CT. Disc levels: The intervertebral discs levels of the thoracic spine are normal. MRI LUMBAR SPINE FINDINGS Segmentation:  Standard. Alignment: There is increased kyphosis at the L1-L2 level secondary to chronic endplate changes, unchanged. Vertebrae: Despite the below described paravertebral abnormalities, there is no appreciable marrow signal abnormality within the lumbar spine. However, in the sacrum there is extensive irregular bone marrow contrast enhancement within the left sacral ala. Conus medullaris: Extends to the L1 level and appears normal. There is a left lateral and dorsal epidural collection that extends from the lower L4 level to the caudal termination of the spinal canal. There is associated peripheral contrast enhancement. The cauda equina nerve roots are displaced and compressed at the L5 level and below. Paraspinal and other soft tissues: There are multiple paraspinal abscesses adjacent to the sacrum and L5 spinous process, in addition to inflammatory contrast enhancement of the posterior paraspinous muscles. The largest fluid collection measures approximately 1.1 x 1.0 cm. At the T12-L1 disc space, there is a lobulated fluid collection that measures approximately 2.5 x 2.7 cm in its greatest AP and CC dimensions with mild surrounding contrast enhancement. Disc levels: T12-L1: There is a fluid collection within the disc space that extends to the prevertebral space, measuring 2.5 cm in greatest AP dimension and 2.7 cm in greatest craniocaudal dimension. There is  no associated bone marrow edema or appreciable endplate enhancement. L1-L2: Near  complete loss of disc space due to sequela of prior infection. No spinal canal stenosis. Moderate narrowing of the right neural foramen. L2-L3: Normal disc space and facets. No spinal canal or neuroforaminal stenosis. L3-L4: Normal disc space and facets. No spinal canal or neuroforaminal stenosis. L4-L5: Small component of epidural abscess within the left dorsal spinal canal. L5-S1: The thecal sac is effaced by mass effect from sacral spinal canal epidural abscess. Visualized sacrum: Normal. IMPRESSION: 1. Sacral epidural abscess extending from the most caudal portion of the spinal canal superiorly to the L4 level, effacing the thecal sac at the L5 level and below, with mass effect on the lower cauda equina nerve roots. 2. 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 3. Well-defined fluid collection in the T12-L1 disc space with mild surrounding contrast enhancement, most consistent with intradiscal abscess with prevertebral extension. 4. Numerous infectious pulmonary lesions, as demonstrated on the 04/08/2018 chest CT. A contrast-enhanced chest CT might be helpful to assess for empyema or pulmonary abscess. Electronically Signed   By: Ulyses Jarred M.D.   On: 04/11/2018 15:38   Mr Lumbar Spine W Wo Contrast  Result Date: 04/11/2018 CLINICAL DATA:  Bacteremia with MRSA endocarditis.  Back pain. EXAM: MRI THORACIC AND LUMBAR SPINE WITHOUT AND WITH CONTRAST TECHNIQUE: Multiplanar and multiecho pulse sequences of the thoracic and lumbar spine were obtained without and with intravenous contrast. CONTRAST:  30m MULTIHANCE GADOBENATE DIMEGLUMINE 529 MG/ML IV SOLN COMPARISON:  CT abdomen pelvis 07/19/2016 FINDINGS: MRI THORACIC SPINE FINDINGS Alignment:  Thoracic alignment is normal. Vertebrae: Normal bone marrow signal. Cord:  Normal Paraspinal and other soft tissues: Small pleural effusions and numerous nodular/cavitary foci throughout the lungs, better  evaluated earlier chest CT. Disc levels: The intervertebral discs levels of the thoracic spine are normal. MRI LUMBAR SPINE FINDINGS Segmentation:  Standard. Alignment: There is increased kyphosis at the L1-L2 level secondary to chronic endplate changes, unchanged. Vertebrae: Despite the below described paravertebral abnormalities, there is no appreciable marrow signal abnormality within the lumbar spine. However, in the sacrum there is extensive irregular bone marrow contrast enhancement within the left sacral ala. Conus medullaris: Extends to the L1 level and appears normal. There is a left lateral and dorsal epidural collection that extends from the lower L4 level to the caudal termination of the spinal canal. There is associated peripheral contrast enhancement. The cauda equina nerve roots are displaced and compressed at the L5 level and below. Paraspinal and other soft tissues: There are multiple paraspinal abscesses adjacent to the sacrum and L5 spinous process, in addition to inflammatory contrast enhancement of the posterior paraspinous muscles. The largest fluid collection measures approximately 1.1 x 1.0 cm. At the T12-L1 disc space, there is a lobulated fluid collection that measures approximately 2.5 x 2.7 cm in its greatest AP and CC dimensions with mild surrounding contrast enhancement. Disc levels: T12-L1: There is a fluid collection within the disc space that extends to the prevertebral space, measuring 2.5 cm in greatest AP dimension and 2.7 cm in greatest craniocaudal dimension. There is no associated bone marrow edema or appreciable endplate enhancement. L1-L2: Near complete loss of disc space due to sequela of prior infection. No spinal canal stenosis. Moderate narrowing of the right neural foramen. L2-L3: Normal disc space and facets. No spinal canal or neuroforaminal stenosis. L3-L4: Normal disc space and facets. No spinal canal or neuroforaminal stenosis. L4-L5: Small component of epidural  abscess within the left dorsal spinal canal. L5-S1: The thecal sac is effaced by mass effect from sacral spinal canal epidural abscess. Visualized sacrum: Normal. IMPRESSION: 1. Sacral epidural abscess extending from the most caudal portion of the spinal canal superiorly to the L4 level, effacing the thecal sac at the L5 level and below, with mass effect on the lower cauda equina nerve roots. 2. 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 3. Well-defined fluid collection in the T12-L1 disc space with mild surrounding contrast enhancement, most consistent with intradiscal abscess with prevertebral extension. 4. Numerous infectious pulmonary lesions, as demonstrated on the 04/08/2018 chest CT. A contrast-enhanced chest CT might be helpful to assess for empyema or pulmonary abscess. Electronically Signed   By: Ulyses Jarred M.D.   On: 04/11/2018 15:38   US Renal  Result Date: 04/08/2018 CLINICAL DATA:  Acute renal failure EXAM: RENAL / URINARY TRACT ULTRASOUND COMPLETE COMPARISON:  CT 07/29/2016 FINDINGS: Right Kidney: Length: 10.9 cm. Increased echogenicity. No hydronephrosis. Trace fluid at the lower pole of right kidney. Left Kidney: Unable to be visualized, secondary to bowel gas. Bladder: Appears normal for degree of bladder distention. Markedly enlarged spleen with volume of 1592.9 cubic cm. 1.9 x 2.1 x 3 cm echogenic area within the spleen, likely corresponding to the hypodense area with calcifications noted on comparison CT. IMPRESSION: 1. Echogenic right kidney consistent with medical renal disease. No hydronephrosis 2. Nonvisualized left kidney, likely due to bowel gas 3. Massively enlarged spleen with volume of 152.9 cubic cm. Electronically Signed   By: Donavan Foil M.D.   On: 04/08/2018 19:39   Dg Chest Port 1 View  Result Date: 04/11/2018 CLINICAL DATA:  Shortness of breath EXAM: PORTABLE CHEST 1 VIEW COMPARISON:  04/08/2018 FINDINGS:  Cardiac shadows within normal limits. The patchy nodular changes bilaterally are again noted and stable. Persistent right basilar infiltrate is seen. No new pneumothorax is noted. No bony abnormality is seen. IMPRESSION: Overall stable appearance when compared with the prior CT examination. Electronically Signed   By: Inez Catalina M.D.   On: 04/11/2018 08:56    echocardiogram  LV EF: 65% -   70%  ------------------------------------------------------------------- Indications:      Endocarditis 421.9.  ------------------------------------------------------------------- History:   PMH:   Dyspnea.  Risk factors:  IV drug abuse. Sepsis. Fever.  ------------------------------------------------------------------- Study Conclusions  - Left ventricle: The cavity size was normal. Systolic function was   vigorous. The estimated ejection fraction was in the range of 65%   to 70%. Wall motion was normal; there were no regional wall   motion abnormalities. - Tricuspid valve: There is a 1.25 x .34 cm mobile vegetation on   the atrial side of the tricuspid valve. There was mild-moderate   regurgitation. - Pulmonary arteries: Systolic pressure was moderately increased.   PA peak pressure: 55 mm Hg (S).  Impressions:  - There was a vegetation, consistent with endocarditis.     Filed Weights   04/07/18 2237 04/08/18 0100 04/10/18 0500  Weight: 41.3 kg (91 lb) 44.5 kg (98 lb 1.6 oz) 49.9 kg (110 lb 0.2 oz)     Microbiology: Recent Results (from the past 240 hour(s))  Blood culture (routine x 2)     Status: Abnormal   Collection Time: 04/07/18 10:22 PM  Result Value Ref Range Status   Specimen Description BLOOD LEFT FOREARM  Final   Special Requests   Final    BOTTLES DRAWN AEROBIC AND ANAEROBIC Blood Culture results  may not be optimal due to an inadequate volume of blood received in culture bottles   Culture  Setup Time   Final    GRAM POSITIVE COCCI IN BOTH AEROBIC AND ANAEROBIC  BOTTLES CRITICAL RESULT CALLED TO, READ BACK BY AND VERIFIED WITH: PHARMD E [place] 04/08/18 AR 1316 BY CM Performed at [facility], [place] [address], [place], [place] [address]    Culture METHICILLIN RESISTANT STAPHYLOCOCCUS AUREUS (A)  Final   Report Status 04/10/2018 FINAL  Final   Organism ID, Bacteria METHICILLIN RESISTANT STAPHYLOCOCCUS AUREUS  Final      Susceptibility   Methicillin resistant staphylococcus aureus - MIC*    CIPROFLOXACIN <=0.5 SENSITIVE Sensitive     ERYTHROMYCIN >=8 RESISTANT Resistant     GENTAMICIN <=0.5 SENSITIVE Sensitive     OXACILLIN >=4 RESISTANT Resistant     TETRACYCLINE <=1 SENSITIVE Sensitive     VANCOMYCIN 1 SENSITIVE Sensitive     TRIMETH/SULFA <=10 SENSITIVE Sensitive     CLINDAMYCIN <=0.25 SENSITIVE Sensitive     RIFAMPIN <=0.5 SENSITIVE Sensitive     Inducible Clindamycin NEGATIVE Sensitive     * METHICILLIN RESISTANT STAPHYLOCOCCUS AUREUS  Blood Culture ID Panel (Reflexed)     Status: Abnormal   Collection Time: 04/07/18 10:22 PM  Result Value Ref Range Status   Enterococcus species NOT DETECTED NOT DETECTED Final   Listeria monocytogenes NOT DETECTED NOT DETECTED Final   Staphylococcus species DETECTED (A) NOT DETECTED Final    Comment: CRITICAL RESULT CALLED TO, READ BACK BY AND VERIFIED WITH: PHARMD E SINCLEAR 04/08/18 AT 1319 BU CM    Staphylococcus aureus DETECTED (A) NOT DETECTED Final    Comment: Methicillin (oxacillin)-resistant Staphylococcus aureus (MRSA). MRSA is predictably resistant to beta-lactam antibiotics (except ceftaroline). Preferred therapy is vancomycin unless clinically contraindicated. Patient requires contact precautions if  hospitalized. CRITICAL RESULT CALLED TO, READ BACK BY AND VERIFIED WITH: PHARMD E SINCLEAR 04/08/18 AT 1319 BY CM    Methicillin resistance DETECTED (A) NOT DETECTED Final    Comment: CRITICAL RESULT CALLED TO, READ BACK BY AND VERIFIED WITH: PHARMD E SINCLEAR 04/08/18 AT 1319 BY CM     Streptococcus species NOT DETECTED NOT DETECTED Final   Streptococcus agalactiae NOT DETECTED NOT DETECTED Final   Streptococcus pneumoniae NOT DETECTED NOT DETECTED Final   Streptococcus pyogenes NOT DETECTED NOT DETECTED Final   Acinetobacter baumannii NOT DETECTED NOT DETECTED Final   Enterobacteriaceae species NOT DETECTED NOT DETECTED Final   Enterobacter cloacae complex NOT DETECTED NOT DETECTED Final   Escherichia coli NOT DETECTED NOT DETECTED Final   Klebsiella oxytoca NOT DETECTED NOT DETECTED Final   Klebsiella pneumoniae NOT DETECTED NOT DETECTED Final   Proteus species NOT DETECTED NOT DETECTED Final   Serratia marcescens NOT DETECTED NOT DETECTED Final   Haemophilus influenzae NOT DETECTED NOT DETECTED Final   Neisseria meningitidis NOT DETECTED NOT DETECTED Final   Pseudomonas aeruginosa NOT DETECTED NOT DETECTED Final   Candida albicans NOT DETECTED NOT DETECTED Final   Candida glabrata NOT DETECTED NOT DETECTED Final   Candida krusei NOT DETECTED NOT DETECTED Final   Candida parapsilosis NOT DETECTED NOT DETECTED Final   Candida tropicalis NOT DETECTED NOT DETECTED Final    Comment: Performed at [facility], [place]. [address], [place],  [address]  Blood culture (routine x 2)     Status: Abnormal   Collection Time: 04/07/18 10:35 PM  Result Value Ref Range Status   Specimen Description BLOOD RIGHT ANTECUBITAL  Final  Special Requests   Final    BOTTLES DRAWN AEROBIC AND ANAEROBIC Blood Culture results may not be optimal due to an inadequate volume of blood received in culture bottles   Culture  Setup Time   Final    GRAM POSITIVE COCCI IN BOTH AEROBIC AND ANAEROBIC BOTTLES CRITICAL VALUE NOTED.  VALUE IS CONSISTENT WITH PREVIOUSLY REPORTED AND CALLED VALUE.    Culture (A)  Final    STAPHYLOCOCCUS AUREUS SUSCEPTIBILITIES PERFORMED ON PREVIOUS CULTURE WITHIN THE LAST 5 DAYS. Performed at [facility], [place] [address] [place], [place] [address]     Report Status 04/10/2018 FINAL  Final  Culture, blood (single)     Status: Abnormal   Collection Time: 04/07/18 11:08 PM  Result Value Ref Range Status   Specimen Description BLOOD RIGHT HAND  Final   Special Requests   Final    BOTTLES DRAWN AEROBIC AND ANAEROBIC Blood Culture results may not be optimal due to an inadequate volume of blood received in culture bottles   Culture  Setup Time   Final    GRAM POSITIVE COCCI IN BOTH AEROBIC AND ANAEROBIC BOTTLES CRITICAL VALUE NOTED.  VALUE IS CONSISTENT WITH PREVIOUSLY REPORTED AND CALLED VALUE.    Culture (A)  Final    STAPHYLOCOCCUS AUREUS SUSCEPTIBILITIES PERFORMED ON PREVIOUS CULTURE WITHIN THE LAST 5 DAYS. Performed at [facility], [place] [address] [place], [place] [address]    Report Status 04/10/2018 FINAL  Final  MRSA PCR Screening     Status: Abnormal   Collection Time: 04/08/18  2:27 AM  Result Value Ref Range Status   MRSA by PCR POSITIVE (A) NEGATIVE Final    Comment:        The GeneXpert MRSA Assay (FDA approved for NASAL specimens only), is one component of a comprehensive MRSA colonization surveillance program. It is not intended to diagnose MRSA infection nor to guide or monitor treatment for MRSA infections. RESULT CALLED TO, READ BACK BY AND VERIFIED WITHParks Ranger RN 04/08/18 0801 JDW   Culture, blood (routine x 2)     Status: None (Preliminary result)   Collection Time: 04/10/18  2:20 PM  Result Value Ref Range Status   Specimen Description BLOOD RIGHT ANTECUBITAL  Final   Special Requests   Final    BOTTLES DRAWN AEROBIC ONLY Blood Culture results may not be optimal due to an inadequate volume of blood received in culture bottles   Culture  Setup Time   Final    GRAM POSITIVE COCCI AEROBIC BOTTLE ONLY CRITICAL RESULT CALLED TO, READ BACK BY AND VERIFIED WITH: PHARMD M AMCCIE 04/12/18 AT 1217 BY CM CRITICAL VALUE NOTED.  VALUE IS CONSISTENT WITH PREVIOUSLY REPORTED AND CALLED VALUE. Performed at  [facility], [place] [address] [place], [place] [address]    Culture GRAM POSITIVE COCCI  Final   Report Status PENDING  Incomplete  Culture, blood (routine x 2)     Status: None (Preliminary result)   Collection Time: 04/10/18  2:26 PM  Result Value Ref Range Status   Specimen Description BLOOD LEFT ANTECUBITAL  Final   Special Requests   Final    BOTTLES DRAWN AEROBIC ONLY Blood Culture results may not be optimal due to an inadequate volume of blood received in culture bottles   Culture   Final    NO GROWTH 2 DAYS Performed at [facility], [place] [address] [place], [place] [address]    Report Status PENDING  Incomplete       Blood Culture    Component Value Date/Time   SDES BLOOD LEFT ANTECUBITAL 04/10/2018 1426   SPECREQUEST  04/10/2018 1426    BOTTLES DRAWN AEROBIC ONLY Blood Culture results may not be optimal due to an inadequate volume of blood received in culture bottles   CULT  04/10/2018 1426    NO GROWTH 2 DAYS Performed at [facility], [place] [address], [place], [place] [address]    REPTSTATUS PENDING 04/10/2018 1426      Labs: Results for orders placed or performed during the hospital encounter of 04/07/18 (from the past 48 hour(s))  Basic metabolic panel     Status: Abnormal   Collection Time: 04/11/18  9:00 AM  Result Value Ref Range   Sodium 136 135 - 145 mmol/L   Potassium 4.6 3.5 - 5.1 mmol/L    Comment: SLIGHT HEMOLYSIS   Chloride 109 98 - 111 mmol/L    Comment: Please note change in reference range.   CO2 21 (L) 22 - 32 mmol/L   Glucose, Bld 109 (H) 70 - 99 mg/dL    Comment: Please note change in reference range.   BUN 58 (H) 6 - 20 mg/dL    Comment: Please note change in reference range.   Creatinine, Ser 1.80 (H) 0.44 - 1.00 mg/dL   Calcium 7.6 (L) 8.9 - 10.3 mg/dL   GFR calc non Af Amer 37 (L) >60 mL/min   GFR calc Af Amer 43 (L) >60 mL/min    Comment: (NOTE) The eGFR has been calculated using the CKD EPI equation. This  calculation has not been validated in all clinical situations. eGFR's persistently <60 mL/min signify possible Chronic Kidney Disease.    Anion gap 6 5 - 15    Comment: Performed at [facility] [address], [place], [place] [address]  CBC     Status: Abnormal   Collection Time: 04/11/18  9:00 AM  Result Value Ref Range   WBC 3.9 (L) 4.0 - 10.5 K/uL   RBC 3.43 (L) 3.87 - 5.11 MIL/uL   Hemoglobin 8.9 (L) 12.0 - 15.0 g/dL   HCT 28.8 (L) 36.0 - 46.0 %   MCV 84.0 78.0 - 100.0 fL   MCH 25.9 (L) 26.0 - 34.0 pg   MCHC 30.9 30.0 - 36.0 g/dL   RDW 18.2 (H) 11.5 - 15.5 %   Platelets 126 (L) 150 - 400 K/uL    Comment: Performed at [facility], [place] [address] [place], [place] [address]  Brain natriuretic peptide     Status: Abnormal   Collection Time: 04/11/18  9:00 AM  Result Value Ref Range   B Natriuretic Peptide 518.4 (H) 0.0 - 100.0 pg/mL    Comment: Performed at [facility] [address], [place], [place] [address]  Basic metabolic panel     Status: Abnormal   Collection Time: 04/12/18  6:03 AM  Result Value Ref Range   Sodium 134 (L) 135 - 145 mmol/L   Potassium 4.8 3.5 - 5.1 mmol/L   Chloride 107 98 - 111 mmol/L    Comment: Please note change in reference range.   CO2 22 22 - 32 mmol/L   Glucose, Bld 98 70 - 99 mg/dL    Comment: Please note change in reference range.   BUN 51 (H) 6 - 20 mg/dL    Comment: Please note change in reference range.   Creatinine, Ser 1.54 (H)  0.44 - 1.00 mg/dL   Calcium 7.7 (L) 8.9 - 10.3 mg/dL   GFR calc non Af Amer 44 (L) >60 mL/min   GFR calc Af Amer 51 (L) >60 mL/min    Comment: (NOTE) The eGFR has been calculated using the CKD EPI equation. This calculation has not been validated in all clinical situations. eGFR's persistently <60 mL/min signify possible Chronic Kidney Disease.    Anion gap 5 5 - 15    Comment: Performed at [facility] [address], [place], [place] [address]  CBC     Status: Abnormal    Collection Time: 04/12/18  6:03 AM  Result Value Ref Range   WBC 4.5 4.0 - 10.5 K/uL   RBC 3.70 (L) 3.87 - 5.11 MIL/uL   Hemoglobin 9.5 (L) 12.0 - 15.0 g/dL    Comment: REPEATED TO VERIFY   HCT 30.7 (L) 36.0 - 46.0 %   MCV 83.0 78.0 - 100.0 fL   MCH 25.7 (L) 26.0 - 34.0 pg   MCHC 30.9 30.0 - 36.0 g/dL   RDW 18.0 (H) 11.5 - 15.5 %   Platelets 118 (L) 150 - 400 K/uL    Comment: REPEATED TO VERIFY SPECIMEN CHECKED FOR CLOTS PLATELET COUNT CONFIRMED BY SMEAR Performed at [facility], [place] [address], [place], [place] [address]      Lipid Panel  No results found for: CHOL, TRIG, HDL, CHOLHDL, VLDL, LDLCALC, LDLDIRECT   No results found for: HGBA1C   Lab Results  Component Value Date   CREATININE 1.54 (H) 04/12/2018     HPI :  31 y.o. female with a PMH of IV drug abuse, history of MRSA bacteremia with AV endocarditis, splenic infarction, septic pulmonary emboli, and thoracic osteomyelitis, hepatitis C Ab+, and bipolar disorder, who is being seen today for the evaluation of endocarditis at the request of Dr. Loleta Books.  Ms. Stansel has a history of MRSA bacteremia with AV endocarditis, antibiotics course complicated due to patient leaving the hospital AMA multiple times. She was recommended to continue suppressive doxycycline, however she discontinued this medication in August of 2018. She was last seen by infectious disease outpatient 11/2017 and unfortunately had relapsed and was using IV drugs again. She returned to the ED 04/07/18 with complaints of fever, chest discomfort, SOB, LE swelling, and a rash.   She was found to have MRSA bacteremia this admission. Echo revealed a vegetation on her tricuspid valve. She was treated with IV daptomycin and doxycycline per ID recommendations. Her hospital course has been complicated by AKI with Cr peak of 3.18, for which nephrology is following. Also with pancytopenia requiring transfusion of 2 uPRBC. Cardiology asked to evaluation patient  for tricuspid valve endocarditis.   She continues to feel poorly with a primary complaint of back pain at the time of this evaluation. She reports ongoing chest pain, SOB, abdominal pain, and orthopnea. Patient reports that she last used IV drugs (heroin) on Thursday.    HOSPITAL COURSE:    Sepsis from MRSA Tricuspid valve MRSA endocarditis Septic pulmonary emboli Echo shows normal EF, tricuspid vegetation 1.2 cm, mild to mod TR.  MRSA in 3/3 blood cultures.  Repeat blood cultures negative so far.  MRI today showed sacral epidural abscess, sacral osteomyelitis, intradiscal abscess, pyomyositis. IV recommended to continue ceftaroline  Infectious disease following-recommend continuing ceftaroline , will need 8 weeks of IV antibiotics, at a minimum but need at least 2 weeks in the hospital Not a surgical candidate for tricuspid  valve endocarditis, no signs of heart failure    Sacral epidural abscess Sacral osteomyelitis T12-L1 intradiscal abscess No leg weakness, bowel or bladder incontinence. -Consulted neurosurgery,  no indication for surgical intervention -Patient is agreeable to be transitioned from PCA, to an oral regimen if needed  Acute renal failure Anasarca Baseline 0.9 mg/dL, admission at 3.8, repeat today improved.  UOP still good.  FeNA 0.4%, renal US unremarkable.  Hep B Sag neg, HIV neg, hep C RNA negative.  No hemolysis.  doubt TTP.  Urine 1.4 g protein.   Seems to be resolving without diuresis. 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  Pancytopenia Bone marrow suppression in infection. Transfused 2 units on admission. Appropriate bump.  No hemolysis.  Doubt TTP, doubt DIC. CBC stable today.  Hemoglobin 9.5, stable  IVDU Dr. Loleta Books,  Discussed with Dr. Evette Doffing  .  Patient's acute pain in her sacrum, lower back appears to be overshadowing withdrawal at this time.  He recommends treating acute pain for now, with full agonists (PCA  , then when acute pain starts to resolve, transition to Suboxone.   Hyponatremia Improving  Hypokalemia Resolved with supplement       Discharge Exam:   Blood pressure 119/89, pulse 60, temperature (!) 97.5 F (36.4 C), temperature source Oral, resp. rate 18, height '5\' 1"'  (1.549 m), weight 49.9 kg (110 lb 0.2 oz), SpO2 98 %.  Cardiovascular: Normal rate, regular rhythm and systolic murmur Pulmonary/Chest: Effort normal and breath sounds normal. No respiratory distress.  has no wheezes.  Abdominal: Soft. Bowel sounds are normal.  exhibits no distension. There is no tenderness.  Lymphadenopathy: no cervical adenopathy. No axillary adenopathy Neurological: alert and oriented to person, place, and time.  Skin: Skin is warm and dry. 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 Scarring  Psychiatric: a normal mood and affect.  behavior is normal.         Signed: Reyne Dumas 04/12/2018, 5:00 PM

## 2018-04-12 NOTE — Progress Notes (Signed)
PROGRESS NOTE    Molly [name]  QIH:[id number] DOB: 07-Jan-1986 DOA: 04/07/2018 PCP: Patient, No Pcp Per      Brief Narrative:  Molly [name] is a 32 y.o. 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  Blood cultures obtained and started on linezolid and cefepime.       Assessment & Plan: Sepsis from MRSA Tricuspid valve MRSA endocarditis Septic pulmonary emboli Echo shows normal EF, tricuspid vegetation 1.2 cm, mild to mod TR.  MRSA in 3/3 blood cultures.  Repeat blood cultures negative so far.  MRI today showed sacral epidural abscess, sacral osteomyelitis, intradiscal abscess, pyomyositis. -Continue ceftaroline  Infectious disease following-recommend continuing ceftaroline , will need 8 weeks of IV antibiotics, at a minimum but need at least 2 weeks in the hospital Not a surgical candidate for tricuspid valve endocarditis, no signs of heart failure    Sacral epidural abscess Sacral osteomyelitis T12-L1 intradiscal abscess No leg weakness, bowel or bladder incontinence. -Consulted neurosurgery,  no indication for surgical intervention -Patient is agreeable to be transitioned from PCA, to an oral regimen if needed  Acute renal failure Anasarca Baseline 0.9 mg/dL, admission at 3.8, repeat today improved.  UOP still good.  FeNA 0.4%, renal US unremarkable.  Hep B Sag neg, HIV neg, hep C RNA negative.  No hemolysis.  doubt TTP.  Urine 1.4 g protein.   Seems to be resolving without diuresis. 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  Pancytopenia Bone marrow suppression in infection. Transfused 2 units on admission. Appropriate  bump.  No hemolysis.  Doubt TTP, doubt DIC. CBC stable today.  Hemoglobin 9.5, stable  IVDU Dr. Loleta Books, Discussed with Dr. Evette Doffing  .  Patient's acute pain in her sacrum, lower back appears to be overshadowing withdrawal at this time.  He recommends treating acute pain for now, with full agonists (PCA or not), then when acute pain starts to resolve, transition to Suboxone.  Will need to call IMTS back when acute pain subsiding.  Hyponatremia Improving  Hypokalemia Resolved with supplement        DVT prophylaxis: SCDs Code Status: FULL Family Communication: Mother by phone MDM and disposition Plan: Below labs and imaging reports were reviewed and summarized above.  Medication changes are made as above.  Case was discussed with infectious disease, internal medicine, and neurosurgery.       Consultants:   Nephrology  Palliative Care  Cardiology  Infectious disease  Neurosurgery  Procedures:   Echocardiogram Study Conclusions  - Left ventricle: The cavity size was normal. Systolic function was   vigorous. The estimated ejection fraction was in the range of 65%   to 70%. Wall motion was normal; there were no regional wall   motion abnormalities. - Tricuspid valve: There is a 1.25 x .34 cm mobile vegetation on   the atrial side of the tricuspid valve. There was mild-moderate   regurgitation. - Pulmonary arteries: Systolic pressure was moderately increased.   PA peak pressure: 55 mm Hg (S).  Impressions:  - There was a vegetation, consistent with endocarditis.    Antimicrobials:   Linezolid 6/21 >> 6/22  Cefepime 6/21 >> 6/22  Daptomycin 6/22 >> 6/24  Doxycycline 6/22 >> 6/24  Ceftaroline 6/24 >>   Subjective: PCA beeping and therefore she is annoyed,  she denies any numbness in her legs, she complains of low back pain  She is agreeable to be switched to a by mouth regimen if needed  Objective: Vitals:   04/12/18 0416 04/12/18 0802 04/12/18  0838 04/12/18 1135  BP:      Pulse:      Resp: (!) 24 (!) 24    Temp:   98.4 F (36.9 C) 97.6 F (36.4 C)  TempSrc:   Oral Oral  SpO2: 93% 96%    Weight:      Height:        Intake/Output Summary (Last 24 hours) at 04/12/2018 1137 Last data filed at 04/11/2018 1440 Gross per 24 hour  Intake 1050 ml  Output -  Net 1050 ml   Filed Weights   04/07/18 2237 04/08/18 0100 04/10/18 0500  Weight: 41.3 kg (91 lb) 44.5 kg (98 lb 1.6 oz) 49.9 kg (110 lb 0.2 oz)    Examination: General appearance: Thin adult female, lying in bed, interactive, in acute pain. HEENT: Anicteric, conjunctival pink, lids and lashes normal.  No nasal deformity, discharge, epistaxis.  Lips moist, teeth normal.  Oropharynx moist, no oral lesions.  Hearing normal. Skin: Dry, no jaundice.  The petechial pustular rash scattered on her legs has not spread or evolved.    Cardiac: Tachycardic, regular, no murmurs appreciated today, no pitting edema, the pitting edema of her arms is resolved. Respiratory: Respiratory effort normal, no rales or wheezes. Abdomen: Abdomen soft without tenderness to palpation, spleen enlarged. MSK: No deformities or effusions of the large joints of the upper or lower extremities bilaterally.  There is an old stable deformity of her thoracic spine without fluctuance. Neuro: Cranial nerves normal, moves all extremities with symmetric strength, global weakness, limited by pain.  Speech fluent.    Psych: Sensorium intact and responding to questions, intention normal.  Affect blunted, by pain.  Judgment and insight appear normal.    Data Reviewed: I have personally reviewed following labs and imaging studies:  CBC: Recent Labs  Lab 04/07/18 2108 04/08/18 0643 04/09/18 1102 04/11/18 0900 04/12/18 0603  WBC 3.5* 2.8* 4.8 3.9* 4.5  NEUTROABS  --  2.4  --   --   --   HGB 8.2* 7.1* 9.5* 8.9* 9.5*  HCT 26.4* 23.0* 29.8* 28.8* 30.7*  MCV 78.8 78.8 83.0 84.0 83.0  PLT 89* 81* 108* 126* 118*    Basic Metabolic Panel: Recent Labs  Lab 04/07/18 2346 04/08/18 0643 04/09/18 1102 04/11/18 0900 04/12/18 0603  NA 130* 132* 134* 136 134*  K 3.5 3.9 3.8 4.6 4.8  CL 96* 100* 103 109 107  CO2 22 23 23  21* 22  GLUCOSE 111* 117* 124* 109* 98  BUN 85* 82* 77* 58* 51*  CREATININE 3.65* 3.22* 3.18* 1.80* 1.54*  CALCIUM 7.5* 7.2* 7.6* 7.6* 7.7*  MG  --  1.7  --   --   --    GFR: Estimated Creatinine Clearance: 40.3 mL/min (A) (by C-G formula based on SCr of 1.54 mg/dL (H)). Liver Function Tests: Recent Labs  Lab 04/07/18 2108 04/09/18 1102  AST 18 14*  ALT 12* 10*  ALKPHOS 93 86  BILITOT 0.6 0.7  PROT 7.1 6.0*  ALBUMIN 2.2* 1.5*   No results for input(s): LIPASE, AMYLASE in the last 168 hours. No results for input(s): AMMONIA in the last 168 hours. Coagulation Profile: Recent Labs  Lab 04/08/18 [phone]  INR 1.47   Cardiac Enzymes: Recent Labs  Lab 04/09/18 1102  CKTOTAL 17*   BNP (last 3 results) No results for input(s): PROBNP in the last 8760 hours. HbA1C: No results for input(s): HGBA1C in the last 72 hours. CBG: No results for input(s): GLUCAP in the last 168 hours. Lipid Profile: No results for input(s): CHOL, HDL, LDLCALC, TRIG, CHOLHDL, LDLDIRECT in the last 72 hours. Thyroid Function Tests: No results for input(s): TSH, T4TOTAL, FREET4, T3FREE, THYROIDAB in the last 72 hours. Anemia Panel: No results for input(s): VITAMINB12, FOLATE, FERRITIN, TIBC, IRON, RETICCTPCT in the last 72 hours. Urine analysis:    Component Value Date/Time   COLORURINE AMBER (A) 04/07/2018 2116   APPEARANCEUR CLOUDY (A) 04/07/2018 2116   LABSPEC 1.015 04/07/2018 2116   PHURINE 5.0 04/07/2018 2116   GLUCOSEU 50 (A) 04/07/2018 2116   HGBUR LARGE (A) 04/07/2018 2116   BILIRUBINUR NEGATIVE 04/07/2018 2116   [place] NEGATIVE 04/07/2018 2116   PROTEINUR 100 (A) 04/07/2018 2116   NITRITE NEGATIVE 04/07/2018 2116   LEUKOCYTESUR MODERATE (A) 04/07/2018 2116   Sepsis  Labs: @LABRCNTIP (procalcitonin:4,lacticacidven:4)  ) Recent Results (from the past 240 hour(s))  Blood culture (routine x 2)     Status: Abnormal   Collection Time: 04/07/18 10:22 PM  Result Value Ref Range Status   Specimen Description BLOOD LEFT FOREARM  Final   Special Requests   Final    BOTTLES DRAWN AEROBIC AND ANAEROBIC Blood Culture results may not be optimal due to an inadequate volume of blood received in culture bottles   Culture  Setup Time   Final    GRAM POSITIVE COCCI IN BOTH AEROBIC AND ANAEROBIC BOTTLES CRITICAL RESULT CALLED TO, READ BACK BY AND VERIFIED WITH: PHARMD E [place] 04/08/18 AR 1316 BY CM Performed at [facility], [place] [address], [place], [place] [address]    Culture METHICILLIN RESISTANT STAPHYLOCOCCUS AUREUS (A)  Final   Report Status 04/10/2018 FINAL  Final   Organism ID, Bacteria METHICILLIN RESISTANT STAPHYLOCOCCUS AUREUS  Final      Susceptibility   Methicillin resistant staphylococcus aureus - MIC*    CIPROFLOXACIN <=0.5 SENSITIVE Sensitive     ERYTHROMYCIN >=8 RESISTANT Resistant     GENTAMICIN <=0.5 SENSITIVE Sensitive     OXACILLIN >=4 RESISTANT Resistant     TETRACYCLINE <=1 SENSITIVE Sensitive     VANCOMYCIN 1 SENSITIVE Sensitive     TRIMETH/SULFA <=10 SENSITIVE Sensitive     CLINDAMYCIN <=0.25 SENSITIVE Sensitive     RIFAMPIN <=0.5 SENSITIVE Sensitive     Inducible Clindamycin NEGATIVE Sensitive     * METHICILLIN RESISTANT STAPHYLOCOCCUS AUREUS  Blood Culture ID Panel (Reflexed)     Status: Abnormal   Collection Time: 04/07/18 10:22 PM  Result Value Ref Range Status   Enterococcus species NOT DETECTED NOT DETECTED Final   Listeria monocytogenes NOT DETECTED NOT DETECTED Final   Staphylococcus species DETECTED (A) NOT DETECTED Final    Comment: CRITICAL RESULT CALLED TO, READ BACK BY AND VERIFIED WITH: PHARMD E [place] 04/08/18 AT 1319 BU CM    Staphylococcus aureus DETECTED (A) NOT DETECTED Final    Comment: Methicillin  (oxacillin)-resistant Staphylococcus aureus (MRSA). MRSA is predictably resistant to beta-lactam antibiotics (except ceftaroline). Preferred therapy is vancomycin unless clinically contraindicated. Patient requires contact precautions if  hospitalized. CRITICAL RESULT CALLED TO, READ BACK BY AND VERIFIED WITH: PHARMD E SINCLEAR 04/08/18 AT 1319 BY CM    Methicillin resistance DETECTED (A) NOT DETECTED Final    Comment: CRITICAL  RESULT CALLED TO, READ BACK BY AND VERIFIED WITH: PHARMD E SINCLE[place] 04/08/18 AT 1319 BY CM    Streptococcus species NOT DETECTED NOT DETECTED Final   Streptococcus agalactiae NOT DETECTED NOT DETECTED Final   Streptococcus pneumoniae NOT DETECTED NOT DETECTED Final   Streptococcus pyogenes NOT DETECTED NOT DETECTED Final   Acinetobacter baumannii NOT DETECTED NOT DETECTED Final   Enterobacteriaceae species NOT DETECTED NOT DETECTED Final   Enterobacter cloacae complex NOT DETECTED NOT DETECTED Final   Escherichia coli NOT DETECTED NOT DETECTED Final   Klebsiella oxytoca NOT DETECTED NOT DETECTED Final   Klebsiella pneumoniae NOT DETECTED NOT DETECTED Final   Proteus species NOT DETECTED NOT DETECTED Final   Serratia marcescens NOT DETECTED NOT DETECTED Final   Haemophilus influenzae NOT DETECTED NOT DETECTED Final   Neisseria meningitidis NOT DETECTED NOT DETECTED Final   Pseudomonas aeruginosa NOT DETECTED NOT DETECTED Final   Candida albicans NOT DETECTED NOT DETECTED Final   Candida glabrata NOT DETECTED NOT DETECTED Final   Candida krusei NOT DETECTED NOT DETECTED Final   Candida parapsilosis NOT DETECTED NOT DETECTED Final   Candida tropicalis NOT DETECTED NOT DETECTED Final    Comment: Performed at [facility], [place] [address] [place], [place] [address]  Blood culture (routine x 2)     Status: Abnormal   Collection Time: 04/07/18 10:35 PM  Result Value Ref Range Status   Specimen Description BLOOD RIGHT ANTECUBITAL  Final   Special Requests   Final     BOTTLES DRAWN AEROBIC AND ANAEROBIC Blood Culture results may not be optimal due to an inadequate volume of blood received in culture bottles   Culture  Setup Time   Final    GRAM POSITIVE COCCI IN BOTH AEROBIC AND ANAEROBIC BOTTLES CRITICAL VALUE NOTED.  VALUE IS CONSISTENT WITH PREVIOUSLY REPORTED AND CALLED VALUE.    Culture (A)  Final    STAPHYLOCOCCUS AUREUS SUSCEPTIBILITIES PERFORMED ON PREVIOUS CULTURE WITHIN THE LAST 5 DAYS. Performed at [facility], [place] [address] [place], [place] [address]    Report Status 04/10/2018 FINAL  Final  Culture, blood (single)     Status: Abnormal   Collection Time: 04/07/18 11:08 PM  Result Value Ref Range Status   Specimen Description BLOOD RIGHT HAND  Final   Special Requests   Final    BOTTLES DRAWN AEROBIC AND ANAEROBIC Blood Culture results may not be optimal due to an inadequate volume of blood received in culture bottles   Culture  Setup Time   Final    GRAM POSITIVE COCCI IN BOTH AEROBIC AND ANAEROBIC BOTTLES CRITICAL VALUE NOTED.  VALUE IS CONSISTENT WITH PREVIOUSLY REPORTED AND CALLED VALUE.    Culture (A)  Final    STAPHYLOCOCCUS AUREUS SUSCEPTIBILITIES PERFORMED ON PREVIOUS CULTURE WITHIN THE LAST 5 DAYS. Performed at [facility], [place] [address], [place],  [address]    Report Status 04/10/2018 FINAL  Final  MRSA PCR Screening     Status: Abnormal   Collection Time: 04/08/18  2:27 AM  Result Value Ref Range Status   MRSA by PCR POSITIVE (A) NEGATIVE Final    Comment:        The GeneXpert MRSA Assay (FDA approved for NASAL specimens only), is one component of a comprehensive MRSA colonization surveillance program. It is not intended to diagnose MRSA infection nor to guide or monitor treatment for MRSA infections. RESULT CALLED TO, READ BACK BY AND VERIFIED WITHParks Ranger RN 04/08/18 0801  JDW   Culture, blood (routine x 2)     Status: None (Preliminary result)   Collection Time: 04/10/18  2:20 PM    Result Value Ref Range Status   Specimen Description BLOOD RIGHT ANTECUBITAL  Final   Special Requests   Final    BOTTLES DRAWN AEROBIC ONLY Blood Culture results may not be optimal due to an inadequate volume of blood received in culture bottles   Culture   Final    NO GROWTH < 24 HOURS Performed at [facility], [place] [address], [place], [place] [address]    Report Status PENDING  Incomplete  Culture, blood (routine x 2)     Status: None (Preliminary result)   Collection Time: 04/10/18  2:26 PM  Result Value Ref Range Status   Specimen Description BLOOD LEFT ANTECUBITAL  Final   Special Requests   Final    BOTTLES DRAWN AEROBIC ONLY Blood Culture results may not be optimal due to an inadequate volume of blood received in culture bottles   Culture   Final    NO GROWTH < 24 HOURS Performed at [facility], [place] [address] [place],  [address]    Report Status PENDING  Incomplete         Radiology Studies: Mr Thoracic Spine W Wo Contrast  Result Date: 04/11/2018 CLINICAL DATA:  Bacteremia with MRSA endocarditis.  Back pain. EXAM: MRI THORACIC AND LUMBAR SPINE WITHOUT AND WITH CONTRAST TECHNIQUE: Multiplanar and multiecho pulse sequences of the thoracic and lumbar spine were obtained without and with intravenous contrast. CONTRAST:  44mL MULTIHANCE GADOBENATE DIMEGLUMINE 529 MG/ML IV SOLN COMPARISON:  CT abdomen pelvis 07/19/2016 FINDINGS: MRI THORACIC SPINE FINDINGS Alignment:  Thoracic alignment is normal. Vertebrae: Normal bone marrow signal. Cord:  Normal Paraspinal and other soft tissues: Small pleural effusions and numerous nodular/cavitary foci throughout the lungs, better evaluated earlier chest CT. Disc levels: The intervertebral discs levels of the thoracic spine are normal. MRI LUMBAR SPINE FINDINGS Segmentation:  Standard. Alignment: There is increased kyphosis at the L1-L2 level secondary to chronic endplate changes, unchanged. Vertebrae: Despite the below  described paravertebral abnormalities, there is no appreciable marrow signal abnormality within the lumbar spine. However, in the sacrum there is extensive irregular bone marrow contrast enhancement within the left sacral ala. Conus medullaris: Extends to the L1 level and appears normal. There is a left lateral and dorsal epidural collection that extends from the lower L4 level to the caudal termination of the spinal canal. There is associated peripheral contrast enhancement. The cauda equina nerve roots are displaced and compressed at the L5 level and below. Paraspinal and other soft tissues: There are multiple paraspinal abscesses adjacent to the sacrum and L5 spinous process, in addition to inflammatory contrast enhancement of the posterior paraspinous muscles. The largest fluid collection measures approximately 1.1 x 1.0 cm. At the T12-L1 disc space, there is a lobulated fluid collection that measures approximately 2.5 x 2.7 cm in its greatest AP and CC dimensions with mild surrounding contrast enhancement. Disc levels: T12-L1: There is a fluid collection within the disc space that extends to the prevertebral space, measuring 2.5 cm in greatest AP dimension and 2.7 cm in greatest craniocaudal dimension. There is no associated bone marrow edema or appreciable endplate enhancement. L1-L2: Near complete loss of disc space due to sequela of prior infection. No spinal canal stenosis. Moderate narrowing of the right neural foramen. L2-L3: Normal disc space and facets. No spinal canal or neuroforaminal stenosis. L3-L4:  Normal disc space and facets. No spinal canal or neuroforaminal stenosis. L4-L5: Small component of epidural abscess within the left dorsal spinal canal. L5-S1: The thecal sac is effaced by mass effect from sacral spinal canal epidural abscess. Visualized sacrum: Normal. IMPRESSION: 1. Sacral epidural abscess extending from the most caudal portion of the spinal canal superiorly to the L4 level, effacing  the thecal sac at the L5 level and below, with mass effect on the lower cauda equina nerve roots. 2. 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 3. Well-defined fluid collection in the T12-L1 disc space with mild surrounding contrast enhancement, most consistent with intradiscal abscess with prevertebral extension. 4. Numerous infectious pulmonary lesions, as demonstrated on the 04/08/2018 chest CT. A contrast-enhanced chest CT might be helpful to assess for empyema or pulmonary abscess. Electronically Signed   By: Ulyses Jarred M.D.   On: 04/11/2018 15:38   Mr Lumbar Spine W Wo Contrast  Result Date: 04/11/2018 CLINICAL DATA:  Bacteremia with MRSA endocarditis.  Back pain. EXAM: MRI THORACIC AND LUMBAR SPINE WITHOUT AND WITH CONTRAST TECHNIQUE: Multiplanar and multiecho pulse sequences of the thoracic and lumbar spine were obtained without and with intravenous contrast. CONTRAST:  76mL MULTIHANCE GADOBENATE DIMEGLUMINE 529 MG/ML IV SOLN COMPARISON:  CT abdomen pelvis 07/19/2016 FINDINGS: MRI THORACIC SPINE FINDINGS Alignment:  Thoracic alignment is normal. Vertebrae: Normal bone marrow signal. Cord:  Normal Paraspinal and other soft tissues: Small pleural effusions and numerous nodular/cavitary foci throughout the lungs, better evaluated earlier chest CT. Disc levels: The intervertebral discs levels of the thoracic spine are normal. MRI LUMBAR SPINE FINDINGS Segmentation:  Standard. Alignment: There is increased kyphosis at the L1-L2 level secondary to chronic endplate changes, unchanged. Vertebrae: Despite the below described paravertebral abnormalities, there is no appreciable marrow signal abnormality within the lumbar spine. However, in the sacrum there is extensive irregular bone marrow contrast enhancement within the left sacral ala. Conus medullaris: Extends to the L1 level and appears normal. There is a left lateral and dorsal epidural  collection that extends from the lower L4 level to the caudal termination of the spinal canal. There is associated peripheral contrast enhancement. The cauda equina nerve roots are displaced and compressed at the L5 level and below. Paraspinal and other soft tissues: There are multiple paraspinal abscesses adjacent to the sacrum and L5 spinous process, in addition to inflammatory contrast enhancement of the posterior paraspinous muscles. The largest fluid collection measures approximately 1.1 x 1.0 cm. At the T12-L1 disc space, there is a lobulated fluid collection that measures approximately 2.5 x 2.7 cm in its greatest AP and CC dimensions with mild surrounding contrast enhancement. Disc levels: T12-L1: There is a fluid collection within the disc space that extends to the prevertebral space, measuring 2.5 cm in greatest AP dimension and 2.7 cm in greatest craniocaudal dimension. There is no associated bone marrow edema or appreciable endplate enhancement. L1-L2: Near complete loss of disc space due to sequela of prior infection. No spinal canal stenosis. Moderate narrowing of the right neural foramen. L2-L3: Normal disc space and facets. No spinal canal or neuroforaminal stenosis. L3-L4: Normal disc space and facets. No spinal canal or neuroforaminal stenosis. L4-L5: Small component of epidural abscess within the left dorsal spinal canal. L5-S1: The thecal sac is effaced by mass effect from sacral spinal canal epidural abscess. Visualized sacrum: Normal. IMPRESSION: 1. Sacral epidural abscess extending from the most caudal portion of the spinal canal superiorly to the L4 level, effacing the  thecal sac at the L5 level and below, with mass effect on the lower cauda equina nerve roots. 2. 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 3. Well-defined fluid collection in the T12-L1 disc space with mild surrounding contrast enhancement, most consistent with  intradiscal abscess with prevertebral extension. 4. Numerous infectious pulmonary lesions, as demonstrated on the 04/08/2018 chest CT. A contrast-enhanced chest CT might be helpful to assess for empyema or pulmonary abscess. Electronically Signed   By: Ulyses Jarred M.D.   On: 04/11/2018 15:38   Dg Chest Port 1 View  Result Date: 04/11/2018 CLINICAL DATA:  Shortness of breath EXAM: PORTABLE CHEST 1 VIEW COMPARISON:  04/08/2018 FINDINGS: Cardiac shadows within normal limits. The patchy nodular changes bilaterally are again noted and stable. Persistent right basilar infiltrate is seen. No new pneumothorax is noted. No bony abnormality is seen. IMPRESSION: Overall stable appearance when compared with the prior CT examination. Electronically Signed   By: Inez Catalina M.D.   On: 04/11/2018 08:56        Scheduled Meds: . Chlorhexidine Gluconate Cloth  6 each Topical Q0600  . HYDROmorphone   Intravenous Q4H  . mupirocin ointment  1 application Nasal BID  . nicotine  14 mg Transdermal Daily  . polyethylene glycol  17 g Oral Daily  . potassium chloride  40 mEq Oral Daily  . sodium chloride flush  3 mL Intravenous Q12H   Continuous Infusions: . sodium chloride 10 mL/hr at 04/12/18 0603  . ceFTAROline (TEFLARO) IV 400 mg (04/12/18 0502)     LOS: 4 days    Time spent: 35 minutes    Reyne Dumas, MD [facility] 04/12/2018, 11:37 AM     Pager [phone] --- please page though AMION:  [email or web address] If 7PM-7AM, please contact night-coverage

## 2018-04-12 NOTE — Progress Notes (Signed)
[facility] NEPHROLOGY PROGRESS NOTE  Assessment/ Plan: Pt is a 32 y.o. yo female  with history of IV drug abuse, hepatitis C antibody positive, severe MRSA bacteremia, endocarditis with septic emboli presented with chest, back pain.  Consulted for acute kidney injury.  Assessment/Plan:  # Acute kidney injury, non-oliguric multifactorial etiology including septic emboli, syn-infectious GN related with MRSA, hemodynamically likely mediated. Less likely TTP as Haptoglobin, LDH unremarkable.  C3-C4 level depressed.  ANA, ANCA negative.. -UA consistent with large WBC and RBCs. Renal ultrasound with medical renal disease.   -Patient reported good urine output although it is not documented.  Serum creatinine level trending down to 1.54.  Continue to monitor BMP.  Avoid nephrotoxins.  I will sign off at this time.  Please call us back with any question.  #Severe sepsis secondary due to MRSA bacteremia/TV endocarditis patient with vegetation.  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  MRI showed sacral epidural abscess, currently on  IV antibiotics per ID.  # Pancytopenia due to severe sepsis.  Subjective: Seen and examined at bedside.  On PCA pump per pain management.  No urinary complaint.  No chest pain or shortness of breath. Objective Vital signs in last 24 hours: Vitals:   04/12/18 0838 04/12/18 1133 04/12/18 1135 04/12/18 1357  BP:    119/89  Pulse:    60  Resp:  (!) 22  18  Temp: 98.4 F (36.9 C)  97.6 F (36.4 C) (!) 97.5 F (36.4 C)  TempSrc: Oral  Oral Oral  SpO2:  93%  98%  Weight:      Height:       Weight change:  No intake or output data in the 24 hours ending 04/12/18 1446     Labs: Basic Metabolic Panel: Recent Labs  Lab 04/09/18 1102 04/11/18 0900 04/12/18 0603  NA 134* 136 134*  K 3.8 4.6 4.8  CL 103 109 107  CO2 23 21* 22  GLUCOSE 124* 109* 98  BUN 77* 58* 51*  CREATININE 3.18* 1.80* 1.54*  CALCIUM 7.6* 7.6* 7.7*   Liver  Function Tests: Recent Labs  Lab 04/07/18 2108 04/09/18 1102  AST 18 14*  ALT 12* 10*  ALKPHOS 93 86  BILITOT 0.6 0.7  PROT 7.1 6.0*  ALBUMIN 2.2* 1.5*   No results for input(s): LIPASE, AMYLASE in the last 168 hours. No results for input(s): AMMONIA in the last 168 hours. CBC: Recent Labs  Lab 04/07/18 2108 04/08/18 0643 04/09/18 1102 04/11/18 0900 04/12/18 0603  WBC 3.5* 2.8* 4.8 3.9* 4.5  NEUTROABS  --  2.4  --   --   --   HGB 8.2* 7.1* 9.5* 8.9* 9.5*  HCT 26.4* 23.0* 29.8* 28.8* 30.7*  MCV 78.8 78.8 83.0 84.0 83.0  PLT 89* 81* 108* 126* 118*   Cardiac Enzymes: Recent Labs  Lab 04/09/18 1102  CKTOTAL 17*   CBG: No results for input(s): GLUCAP in the last 168 hours.  Iron Studies: No results for input(s): IRON, TIBC, TRANSFERRIN, FERRITIN in the last 72 hours. Studies/Results: Mr Thoracic Spine W Wo Contrast  Result Date: 04/11/2018 CLINICAL DATA:  Bacteremia with MRSA endocarditis.  Back pain. EXAM: MRI THORACIC AND LUMBAR SPINE WITHOUT AND WITH CONTRAST TECHNIQUE: Multiplanar and multiecho pulse sequences of the thoracic and lumbar spine were obtained without and with intravenous contrast. CONTRAST:  6mL MULTIHANCE GADOBENATE DIMEGLUMINE 529 MG/ML IV SOLN COMPARISON:  CT abdomen pelvis 07/19/2016 FINDINGS: MRI THORACIC SPINE FINDINGS Alignment:  Thoracic  alignment is normal. Vertebrae: Normal bone marrow signal. Cord:  Normal Paraspinal and other soft tissues: Small pleural effusions and numerous nodular/cavitary foci throughout the lungs, better evaluated earlier chest CT. Disc levels: The intervertebral discs levels of the thoracic spine are normal. MRI LUMBAR SPINE FINDINGS Segmentation:  Standard. Alignment: There is increased kyphosis at the L1-L2 level secondary to chronic endplate changes, unchanged. Vertebrae: Despite the below described paravertebral abnormalities, there is no appreciable marrow signal abnormality within the lumbar spine. However, in the sacrum  there is extensive irregular bone marrow contrast enhancement within the left sacral ala. Conus medullaris: Extends to the L1 level and appears normal. There is a left lateral and dorsal epidural collection that extends from the lower L4 level to the caudal termination of the spinal canal. There is associated peripheral contrast enhancement. The cauda equina nerve roots are displaced and compressed at the L5 level and below. Paraspinal and other soft tissues: There are multiple paraspinal abscesses adjacent to the sacrum and L5 spinous process, in addition to inflammatory contrast enhancement of the posterior paraspinous muscles. The largest fluid collection measures approximately 1.1 x 1.0 cm. At the T12-L1 disc space, there is a lobulated fluid collection that measures approximately 2.5 x 2.7 cm in its greatest AP and CC dimensions with mild surrounding contrast enhancement. Disc levels: T12-L1: There is a fluid collection within the disc space that extends to the prevertebral space, measuring 2.5 cm in greatest AP dimension and 2.7 cm in greatest craniocaudal dimension. There is no associated bone marrow edema or appreciable endplate enhancement. L1-L2: Near complete loss of disc space due to sequela of prior infection. No spinal canal stenosis. Moderate narrowing of the right neural foramen. L2-L3: Normal disc space and facets. No spinal canal or neuroforaminal stenosis. L3-L4: Normal disc space and facets. No spinal canal or neuroforaminal stenosis. L4-L5: Small component of epidural abscess within the left dorsal spinal canal. L5-S1: The thecal sac is effaced by mass effect from sacral spinal canal epidural abscess. Visualized sacrum: Normal. IMPRESSION: 1. Sacral epidural abscess extending from the most caudal portion of the spinal canal superiorly to the L4 level, effacing the thecal sac at the L5 level and below, with mass effect on the lower cauda equina nerve roots. 2. 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 3. Well-defined fluid collection in the T12-L1 disc space with mild surrounding contrast enhancement, most consistent with intradiscal abscess with prevertebral extension. 4. Numerous infectious pulmonary lesions, as demonstrated on the 04/08/2018 chest CT. A contrast-enhanced chest CT might be helpful to assess for empyema or pulmonary abscess. Electronically Signed   By: Ulyses Jarred M.D.   On: 04/11/2018 15:38   Mr Lumbar Spine W Wo Contrast  Result Date: 04/11/2018 CLINICAL DATA:  Bacteremia with MRSA endocarditis.  Back pain. EXAM: MRI THORACIC AND LUMBAR SPINE WITHOUT AND WITH CONTRAST TECHNIQUE: Multiplanar and multiecho pulse sequences of the thoracic and lumbar spine were obtained without and with intravenous contrast. CONTRAST:  56mL MULTIHANCE GADOBENATE DIMEGLUMINE 529 MG/ML IV SOLN COMPARISON:  CT abdomen pelvis 07/19/2016 FINDINGS: MRI THORACIC SPINE FINDINGS Alignment:  Thoracic alignment is normal. Vertebrae: Normal bone marrow signal. Cord:  Normal Paraspinal and other soft tissues: Small pleural effusions and numerous nodular/cavitary foci throughout the lungs, better evaluated earlier chest CT. Disc levels: The intervertebral discs levels of the thoracic spine are normal. MRI LUMBAR SPINE FINDINGS Segmentation:  Standard. Alignment: There is increased kyphosis at the L1-L2 level secondary to chronic endplate  changes, unchanged. Vertebrae: Despite the below described paravertebral abnormalities, there is no appreciable marrow signal abnormality within the lumbar spine. However, in the sacrum there is extensive irregular bone marrow contrast enhancement within the left sacral ala. Conus medullaris: Extends to the L1 level and appears normal. There is a left lateral and dorsal epidural collection that extends from the lower L4 level to the caudal termination of the spinal canal. There is associated peripheral contrast  enhancement. The cauda equina nerve roots are displaced and compressed at the L5 level and below. Paraspinal and other soft tissues: There are multiple paraspinal abscesses adjacent to the sacrum and L5 spinous process, in addition to inflammatory contrast enhancement of the posterior paraspinous muscles. The largest fluid collection measures approximately 1.1 x 1.0 cm. At the T12-L1 disc space, there is a lobulated fluid collection that measures approximately 2.5 x 2.7 cm in its greatest AP and CC dimensions with mild surrounding contrast enhancement. Disc levels: T12-L1: There is a fluid collection within the disc space that extends to the prevertebral space, measuring 2.5 cm in greatest AP dimension and 2.7 cm in greatest craniocaudal dimension. There is no associated bone marrow edema or appreciable endplate enhancement. L1-L2: Near complete loss of disc space due to sequela of prior infection. No spinal canal stenosis. Moderate narrowing of the right neural foramen. L2-L3: Normal disc space and facets. No spinal canal or neuroforaminal stenosis. L3-L4: Normal disc space and facets. No spinal canal or neuroforaminal stenosis. L4-L5: Small component of epidural abscess within the left dorsal spinal canal. L5-S1: The thecal sac is effaced by mass effect from sacral spinal canal epidural abscess. Visualized sacrum: Normal. IMPRESSION: 1. Sacral epidural abscess extending from the most caudal portion of the spinal canal superiorly to the L4 level, effacing the thecal sac at the L5 level and below, with mass effect on the lower cauda equina nerve roots. 2. 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 3. Well-defined fluid collection in the T12-L1 disc space with mild surrounding contrast enhancement, most consistent with intradiscal abscess with prevertebral extension. 4. Numerous infectious pulmonary lesions, as demonstrated on the 04/08/2018 chest CT. A  contrast-enhanced chest CT might be helpful to assess for empyema or pulmonary abscess. Electronically Signed   By: Ulyses Jarred M.D.   On: 04/11/2018 15:38   Dg Chest Port 1 View  Result Date: 04/11/2018 CLINICAL DATA:  Shortness of breath EXAM: PORTABLE CHEST 1 VIEW COMPARISON:  04/08/2018 FINDINGS: Cardiac shadows within normal limits. The patchy nodular changes bilaterally are again noted and stable. Persistent right basilar infiltrate is seen. No new pneumothorax is noted. No bony abnormality is seen. IMPRESSION: Overall stable appearance when compared with the prior CT examination. Electronically Signed   By: Inez Catalina M.D.   On: 04/11/2018 08:56    Medications: Infusions: . sodium chloride 10 mL/hr at 04/12/18 0603  . ceFTAROline (TEFLARO) IV 400 mg (04/12/18 0502)    Scheduled Medications: . Chlorhexidine Gluconate Cloth  6 each Topical Q0600  . HYDROmorphone   Intravenous Q4H  . mupirocin ointment  1 application Nasal BID  . nicotine  14 mg Transdermal Daily  . polyethylene glycol  17 g Oral Daily  . potassium chloride  40 mEq Oral Daily  . sodium chloride flush  3 mL Intravenous Q12H    have reviewed scheduled and prn medications.  Physical Exam: General: Not in distress, lying in bed Heart: Regular rate rhythm S1-S2 normal Lungs: Clear bilateral, no crackle Abdomen:soft,  Non-tender, non-distended Extremities: No edema  Dron Prasad Bhandari 04/12/2018,2:46 PM  LOS: 4 days

## 2018-04-12 NOTE — Progress Notes (Signed)
CSW received consult from patient's mother and palliative regarding applying for disability. CSW sent email to financial counseling to speak with patient's mother, since patient is requesting to go through her mother.   Molly Locus Dorcus Riga LCSW [phone]

## 2018-04-13 LAB — CULTURE, BLOOD (ROUTINE X 2)

## 2018-04-14 DIAGNOSIS — F1920 Other psychoactive substance dependence, uncomplicated: Secondary | ICD-10-CM

## 2018-04-15 LAB — CULTURE, BLOOD (ROUTINE X 2): CULTURE: NO GROWTH

## 2018-04-22 ENCOUNTER — Encounter (HOSPITAL_COMMUNITY): Payer: Self-pay

## 2018-04-22 ENCOUNTER — Emergency Department (HOSPITAL_COMMUNITY): Payer: Self-pay

## 2018-04-22 ENCOUNTER — Inpatient Hospital Stay (HOSPITAL_COMMUNITY)
Admission: EM | Admit: 2018-04-22 | Discharge: 2018-04-23 | DRG: 288 | Discharge status: Against Medical Advice | Payer: Self-pay | Attending: Family Medicine | Admitting: Family Medicine

## 2018-04-22 DIAGNOSIS — Z975 Presence of (intrauterine) contraceptive device: Secondary | ICD-10-CM

## 2018-04-22 DIAGNOSIS — N183 Chronic kidney disease, stage 3 (moderate): Secondary | ICD-10-CM | POA: Diagnosis present

## 2018-04-22 DIAGNOSIS — Z86711 Personal history of pulmonary embolism: Secondary | ICD-10-CM

## 2018-04-22 DIAGNOSIS — Z91199 Patient's noncompliance with other medical treatment and regimen due to unspecified reason: Secondary | ICD-10-CM

## 2018-04-22 DIAGNOSIS — F17210 Nicotine dependence, cigarettes, uncomplicated: Secondary | ICD-10-CM | POA: Diagnosis present

## 2018-04-22 DIAGNOSIS — B9562 Methicillin resistant Staphylococcus aureus infection as the cause of diseases classified elsewhere: Secondary | ICD-10-CM | POA: Diagnosis present

## 2018-04-22 DIAGNOSIS — I079 Rheumatic tricuspid valve disease, unspecified: Secondary | ICD-10-CM | POA: Diagnosis present

## 2018-04-22 DIAGNOSIS — I330 Acute and subacute infective endocarditis: Principal | ICD-10-CM | POA: Diagnosis present

## 2018-04-22 DIAGNOSIS — M4625 Osteomyelitis of vertebra, thoracolumbar region: Secondary | ICD-10-CM | POA: Diagnosis present

## 2018-04-22 DIAGNOSIS — I272 Pulmonary hypertension, unspecified: Secondary | ICD-10-CM | POA: Diagnosis present

## 2018-04-22 DIAGNOSIS — B9561 Methicillin susceptible Staphylococcus aureus infection as the cause of diseases classified elsewhere: Secondary | ICD-10-CM | POA: Diagnosis present

## 2018-04-22 DIAGNOSIS — N189 Chronic kidney disease, unspecified: Secondary | ICD-10-CM | POA: Diagnosis present

## 2018-04-22 DIAGNOSIS — G061 Intraspinal abscess and granuloma: Secondary | ICD-10-CM | POA: Diagnosis present

## 2018-04-22 DIAGNOSIS — D731 Hypersplenism: Secondary | ICD-10-CM | POA: Diagnosis present

## 2018-04-22 DIAGNOSIS — I2601 Septic pulmonary embolism with acute cor pulmonale: Secondary | ICD-10-CM

## 2018-04-22 DIAGNOSIS — F1990 Other psychoactive substance use, unspecified, uncomplicated: Secondary | ICD-10-CM

## 2018-04-22 DIAGNOSIS — Z811 Family history of alcohol abuse and dependence: Secondary | ICD-10-CM

## 2018-04-22 DIAGNOSIS — R8290 Unspecified abnormal findings in urine: Secondary | ICD-10-CM | POA: Diagnosis present

## 2018-04-22 DIAGNOSIS — B958 Unspecified staphylococcus as the cause of diseases classified elsewhere: Secondary | ICD-10-CM | POA: Diagnosis present

## 2018-04-22 DIAGNOSIS — Z9119 Patient's noncompliance with other medical treatment and regimen: Secondary | ICD-10-CM

## 2018-04-22 DIAGNOSIS — N179 Acute kidney failure, unspecified: Secondary | ICD-10-CM | POA: Diagnosis present

## 2018-04-22 DIAGNOSIS — M4628 Osteomyelitis of vertebra, sacral and sacrococcygeal region: Secondary | ICD-10-CM | POA: Diagnosis present

## 2018-04-22 DIAGNOSIS — E875 Hyperkalemia: Secondary | ICD-10-CM | POA: Diagnosis present

## 2018-04-22 DIAGNOSIS — R7881 Bacteremia: Secondary | ICD-10-CM

## 2018-04-22 DIAGNOSIS — F1120 Opioid dependence, uncomplicated: Secondary | ICD-10-CM | POA: Diagnosis present

## 2018-04-22 DIAGNOSIS — Z880 Allergy status to penicillin: Secondary | ICD-10-CM

## 2018-04-22 DIAGNOSIS — Z720 Tobacco use: Secondary | ICD-10-CM | POA: Diagnosis present

## 2018-04-22 DIAGNOSIS — R0600 Dyspnea, unspecified: Secondary | ICD-10-CM

## 2018-04-22 DIAGNOSIS — D61818 Other pancytopenia: Secondary | ICD-10-CM | POA: Diagnosis present

## 2018-04-22 HISTORY — DX: Unspecified abnormal findings in urine: R82.90

## 2018-04-22 HISTORY — DX: Chronic kidney disease, stage 3 unspecified: N18.30

## 2018-04-22 HISTORY — DX: Bacteremia: R78.81

## 2018-04-22 HISTORY — DX: Other pancytopenia: D61.818

## 2018-04-22 HISTORY — DX: Patient's noncompliance with other medical treatment and regimen due to unspecified reason: Z91.199

## 2018-04-22 HISTORY — DX: Patient's noncompliance with other medical treatment and regimen: Z91.19

## 2018-04-22 HISTORY — DX: Osteomyelitis of vertebra, sacral and sacrococcygeal region: M46.28

## 2018-04-22 HISTORY — DX: Septic pulmonary embolism without acute cor pulmonale: I26.90

## 2018-04-22 HISTORY — DX: Intraspinal abscess and granuloma: G06.1

## 2018-04-22 HISTORY — DX: Hypersplenism: D73.1

## 2018-04-22 HISTORY — DX: Septic pulmonary embolism with acute cor pulmonale: I26.01

## 2018-04-22 HISTORY — DX: Tobacco use: Z72.0

## 2018-04-22 HISTORY — DX: Chronic kidney disease, stage 3 (moderate): N18.3

## 2018-04-22 HISTORY — DX: Methicillin resistant Staphylococcus aureus infection as the cause of diseases classified elsewhere: B95.62

## 2018-04-22 LAB — CBC WITH DIFFERENTIAL/PLATELET
BASOS ABS: 0 10*3/uL (ref 0.0–0.1)
Basophils Relative: 1 %
Eosinophils Absolute: 0 10*3/uL (ref 0.0–0.7)
Eosinophils Relative: 0 %
HCT: 24.9 % — ABNORMAL LOW (ref 36.0–46.0)
Hemoglobin: 7.2 g/dL — ABNORMAL LOW (ref 12.0–15.0)
LYMPHS ABS: 0.7 10*3/uL (ref 0.7–4.0)
LYMPHS PCT: 33 %
MCH: 25.8 pg — ABNORMAL LOW (ref 26.0–34.0)
MCHC: 28.9 g/dL — ABNORMAL LOW (ref 30.0–36.0)
MCV: 89.2 fL (ref 78.0–100.0)
MONOS PCT: 11 %
Monocytes Absolute: 0.2 10*3/uL (ref 0.1–1.0)
NEUTROS ABS: 1.3 10*3/uL — AB (ref 1.7–7.7)
Neutrophils Relative %: 55 %
PLATELETS: 115 10*3/uL — AB (ref 150–400)
RBC: 2.79 MIL/uL — AB (ref 3.87–5.11)
RDW: 18.4 % — AB (ref 11.5–15.5)
WBC: 2.2 10*3/uL — AB (ref 4.0–10.5)

## 2018-04-22 LAB — URINALYSIS, ROUTINE W REFLEX MICROSCOPIC
BILIRUBIN URINE: NEGATIVE
GLUCOSE, UA: NEGATIVE mg/dL
Ketones, ur: NEGATIVE mg/dL
LEUKOCYTES UA: NEGATIVE
Nitrite: NEGATIVE
PH: 5 (ref 5.0–8.0)
Protein, ur: 100 mg/dL — AB
SPECIFIC GRAVITY, URINE: 1.017 (ref 1.005–1.030)

## 2018-04-22 LAB — LACTATE DEHYDROGENASE: LDH: 181 U/L (ref 98–192)

## 2018-04-22 LAB — COMPREHENSIVE METABOLIC PANEL
ALBUMIN: 2 g/dL — AB (ref 3.5–5.0)
ALT: 10 U/L (ref 0–44)
AST: 17 U/L (ref 15–41)
Alkaline Phosphatase: 74 U/L (ref 38–126)
Anion gap: 9 (ref 5–15)
BUN: 40 mg/dL — AB (ref 6–20)
CHLORIDE: 108 mmol/L (ref 98–111)
CO2: 21 mmol/L — AB (ref 22–32)
CREATININE: 2.43 mg/dL — AB (ref 0.44–1.00)
Calcium: 8.2 mg/dL — ABNORMAL LOW (ref 8.9–10.3)
GFR calc Af Amer: 30 mL/min — ABNORMAL LOW (ref >60)
GFR calc non Af Amer: 26 mL/min — ABNORMAL LOW (ref >60)
GLUCOSE: 106 mg/dL — AB (ref 70–99)
POTASSIUM: 5.3 mmol/L — AB (ref 3.5–5.1)
Sodium: 138 mmol/L (ref 135–145)
TOTAL PROTEIN: 7.2 g/dL (ref 6.5–8.1)
Total Bilirubin: 0.5 mg/dL (ref 0.3–1.2)

## 2018-04-22 LAB — SODIUM, URINE, RANDOM: SODIUM UR: 61 mmol/L

## 2018-04-22 LAB — BRAIN NATRIURETIC PEPTIDE: B Natriuretic Peptide: 357 pg/mL — ABNORMAL HIGH (ref 0.0–100.0)

## 2018-04-22 MED ORDER — ACETAMINOPHEN 325 MG PO TABS: 650.0000 mg | ORAL_TABLET | Freq: Four times a day (QID) | ORAL | Status: DC | PRN

## 2018-04-22 MED ORDER — ONDANSETRON HCL 4 MG PO TABS: 4.0000 mg | ORAL_TABLET | Freq: Four times a day (QID) | ORAL | Status: DC | PRN

## 2018-04-22 MED ORDER — ONDANSETRON HCL 4 MG/2ML IJ SOLN
4.0000 mg | Freq: Four times a day (QID) | INTRAMUSCULAR | Status: DC | PRN
Start: 2018-04-22 — End: 2018-04-23

## 2018-04-22 MED ORDER — ACETAMINOPHEN 650 MG RE SUPP: 650.0000 mg | Freq: Four times a day (QID) | RECTAL | Status: DC | PRN

## 2018-04-22 MED ORDER — SODIUM CHLORIDE 0.9 % IV SOLN
INTRAVENOUS | Status: DC
  Administered 2018-04-22 – 2018-04-23 (×3): via INTRAVENOUS

## 2018-04-22 MED ORDER — SODIUM CHLORIDE 0.9 % IV SOLN
300.0000 mg | Freq: Two times a day (BID) | INTRAVENOUS | Status: DC
  Administered 2018-04-22: 300 mg via INTRAVENOUS
  Filled 2018-04-22 (×3): qty 300

## 2018-04-22 MED ORDER — NICOTINE 21 MG/24HR TD PT24
21.0000 mg | MEDICATED_PATCH | Freq: Every day | TRANSDERMAL | Status: DC
  Administered 2018-04-22 – 2018-04-23 (×2): 21 mg via TRANSDERMAL
  Filled 2018-04-22 (×2): qty 1

## 2018-04-22 MED ORDER — HEPARIN SODIUM (PORCINE) 5000 UNIT/ML IJ SOLN
5000.0000 [IU] | Freq: Three times a day (TID) | INTRAMUSCULAR | Status: DC
  Administered 2018-04-22 – 2018-04-23 (×2): 5000 [IU] via SUBCUTANEOUS
  Filled 2018-04-22 (×3): qty 1

## 2018-04-22 MED ORDER — FUROSEMIDE 10 MG/ML IJ SOLN
40.0000 mg | Freq: Once | INTRAMUSCULAR | Status: AC
  Administered 2018-04-22: 40 mg via INTRAVENOUS
  Filled 2018-04-22: qty 4

## 2018-04-22 MED ORDER — TRAMADOL HCL 50 MG PO TABS: 50.0000 mg | ORAL_TABLET | Freq: Four times a day (QID) | ORAL | Status: DC | PRN

## 2018-04-22 MED ORDER — LIDOCAINE-EPINEPHRINE (PF) 2 %-1:200000 IJ SOLN: 10.0000 mL | Freq: Once | INTRAMUSCULAR | Status: DC

## 2018-04-22 MED ORDER — SODIUM POLYSTYRENE SULFONATE 15 GM/60ML PO SUSP
15.0000 g | Freq: Once | ORAL | Status: AC
  Administered 2018-04-22: 15 g via ORAL
  Filled 2018-04-22: qty 60

## 2018-04-22 MED ORDER — SODIUM CHLORIDE 0.9 % IV SOLN: 600.0000 mg | Freq: Two times a day (BID) | INTRAVENOUS | Status: DC

## 2018-04-22 NOTE — ED Notes (Signed)
Could only get one set of cultures pt is a very difficult stick.

## 2018-04-22 NOTE — ED Provider Notes (Signed)
[facility] EMERGENCY DEPARTMENT Provider Note   CSN: [id number] Arrival date & time: 04/22/18  1317     History   Chief Complaint No chief complaint on file.   HPI Molly [name] is a 32 y.o. female.  HPI  32 yo female ho ivda, endocarditis, vertebral osteomyelitis left ama 6/26 and has been off medications.  She comes back to the hospital due to worsening peripheral edema and dyspnea.  She states she had some headache last night.  Last use heroin yesterday with right groin site.  She states she took some antibiotics while out from a friend who also was treated for endocarditis. Denies fever,  Nausea, vomiting, gi bleeding.  She has an iud in place.   ID consult 6/26-Physical exam shows murmur, ongoing non blanching rash to lower extremitites. Micro suggests she still has ongoing bacteremia. Lab shows improvement in cr   Continue with ceftaroline for now but will likely transition to iv vancomycin once her cr returns back to baseline s. Warf is currently on Day 6 of antimicrobial therapy an Day 3 of ceftaroline for disseminated MRSA infection and bacteremia. Repeat blood cultures remain positive which is not unexpected given the burden of infection. She also has known tricuspid valve endocarditis. Pain control remains a concern and now on PCA pump per primary team. IMTS evaluated for Suboxone and awaiting resolution of acute pain. NeMurosurgery evaluated with no surgical intervention at this time.   1. Continue current dosage of Ceftaroline. Will monitor kidney function with possible return to vancomycin pending normalization of kidney function   Past Medical History:  Diagnosis Date  . Abscess of skin    buttock - most recently 2009/10  . Drug-seeking behavior   . IV drug user   . MRSA (methicillin resistant Staphylococcus aureus) infection     Patient Active Problem List   Diagnosis Date Noted  . Addiction to drug ([place])   . Acute renal failure (ARF)  ([place])   . Epidural abscess   . Acute bacterial endocarditis   . Chronic thoracic back pain   . Palliative care by specialist   . Severe sepsis ([place]) 04/08/2018  . Hyponatremia 04/08/2018  . Eczema 07/12/2017  . Acute upper respiratory infection 11/02/2016  . Hepatitis C antibody test positive 08/24/2016  . Discitis of lumbosacral region 07/19/2016  . Splenomegaly   . Osteomyelitis of vertebra of thoracolumbar region ([place])   . Polysubstance abuse ([place])   . Goals of care, counseling/discussion   . Cocaine use 11/28/2015  . Pancytopenia ([place]) 11/27/2015  . AKI (acute kidney injury) ([place]) 11/27/2015  . Anasarca 11/27/2015  . Cellulitis of multiple sites of right hand and fingers 07/07/2013  . Anemia 07/04/2013  . Pulmonary HTN ([place]) 07/04/2013  . 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 Septic pulmonary embolism ([place]) 07/03/2013  . Intravenous drug abuse, continuous ([place]) 07/03/2013  . MRSA bacteremia 07/03/2013    Past Surgical History:  Procedure Laterality Date  . IR GENERIC HISTORICAL  07/19/2016   IR LUMBAR DISC ASPIRATION W/IMG GUIDE 07/19/2016 Arne Cleveland, MD [facility]-INTERV RAD  . RADIOLOGY WITH ANESTHESIA N/A 04/11/2018   Procedure: MRI OF LUMBAR AND THORACIC SPINE WITHOUT CONTRAST WITH ANESTHESIA;  Surgeon: Radiologist, Medication, MD;  Location: [facility];  Service: Radiology;  Laterality: N/A;  . TEE WITHOUT CARDIOVERSION N/A 07/04/2013   Procedure: TRANSESOPHAGEAL ECHOCARDIOGRAM (TEE);  Surgeon: Larey Dresser, MD;  Location: [facility];  Service: Cardiovascular;  Laterality:  N/A;  . TONSILLECTOMY     32 years old     OB History   None      Home Medications    Prior to Admission medications   Medication Sig Start Date End Date Taking? Authorizing Provider  ibuprofen (ADVIL,MOTRIN) 200 MG tablet Take 400 mg by mouth every 6 (six) hours as needed for moderate pain.     [provider]  senna-docusate (SENOKOT-S)  8.6-50 MG tablet Take 1 tablet by mouth at bedtime as needed for mild constipation. Patient not taking: Reported on 07/12/2017 07/21/16   Florinda Marker, MD    Family History Family History  Problem Relation Age of Onset  . Alcoholism Father     Social History Social History   Tobacco Use  . Smoking status: Current Every Day Smoker    Packs/day: 0.50    Years: 7.00    Pack years: 3.50    Types: Cigarettes  . Smokeless tobacco: Never Used  . Tobacco comment: slowing down  Substance Use Topics  . Alcohol use: No  . Drug use: Yes    Types: IV, Oxycodone    Comment: heroin     Allergies   Vancomycin   Review of Systems Review of Systems   Physical Exam Updated Vital Signs BP 106/79 (BP Location: Right Arm)   Pulse 67   Temp 98.2 F (36.8 C) (Oral)   Resp 16   SpO2 96%   Physical Exam  Constitutional: She is oriented to person, place, and time. She appears well-developed and well-nourished. No distress.  Chronically ill-appearing female who appears much older than stated age  HENT:  Head: Normocephalic and atraumatic.  Right Ear: External ear normal.  Left Ear: External ear normal.  Nose: Nose normal.  Mouth/Throat: Oropharynx is clear and moist.  Eyes: Pupils are equal, round, and reactive to light. EOM are normal.  Mucosa are pale  Neck: Normal range of motion.  Cardiovascular: Normal rate and regular rhythm.  Pulmonary/Chest: Effort normal and breath sounds normal.  Abdominal: Soft.  Musculoskeletal: She exhibits edema and tenderness.  Bilateral lower extremity edema Right groin with site of recent IV drug abuse no signs of infection noted  Neurological: She is alert and oriented to person, place, and time.  Skin: Skin is warm and dry. Capillary refill takes less than 2 seconds.  Nursing note and vitals reviewed.    ED Treatments / Results  Labs (all labs ordered are listed, but only abnormal results are displayed) Labs Reviewed  CBC WITH  DIFFERENTIAL/PLATELET - Abnormal; Notable for the following components:      Result Value   WBC 2.2 (*)    RBC 2.79 (*)    Hemoglobin 7.2 (*)    HCT 24.9 (*)    MCH 25.8 (*)    MCHC 28.9 (*)    RDW 18.4 (*)    Platelets 115 (*)    Neutro Abs 1.3 (*)    All other components within normal limits  COMPREHENSIVE METABOLIC PANEL - Abnormal; Notable for the following components:   Potassium 5.3 (*)    CO2 21 (*)    Glucose, Bld 106 (*)    BUN 40 (*)    Creatinine, Ser 2.43 (*)    Calcium 8.2 (*)    Albumin 2.0 (*)    GFR calc non Af Amer 26 (*)    GFR calc Af Amer 30 (*)    All other components within normal limits    EKG None  Radiology Dg Chest Port 1 View  Result Date: 04/22/2018 CLINICAL DATA:  Insert clinical EXAM: PORTABLE CHEST 1 VIEW COMPARISON:  Chest x-rays dated 04/11/2018 and 11/27/2015. FINDINGS: Stable cardiomegaly. Stable elevation of the RIGHT hemidiaphragm. Patchy nodular opacities are again seen bilaterally, not appreciably changed in the interval, better demonstrated on earlier chest CT. No new lung findings. No pleural effusion or pneumothorax seen. IMPRESSION: No significant interval change. Patchy nodular opacities again seen bilaterally, better demonstrated on chest CT of 04/11/2018, described as septic emboli on CT report. No new lung findings. Cardiomegaly appears grossly stable. Electronically Signed   By: Franki Cabot M.D.   On: 04/22/2018 16:31    Procedures .Critical Care Performed by: Pattricia Boss, MD Authorized by: Pattricia Boss, MD   Critical care provider statement:    Critical care time (minutes):  60   Critical care end time:  04/22/2018 5:55 PM   Critical care was necessary to treat or prevent imminent or life-threatening deterioration of the following conditions:  Circulatory failure, dehydration, renal failure and sepsis   Critical care was time spent personally by me on the following activities:  Development of treatment plan with patient or  surrogate, discussions with consultants, evaluation of patient's response to treatment, examination of patient, ordering and performing treatments and interventions, ordering and review of laboratory studies, ordering and review of radiographic studies, pulse oximetry, re-evaluation of patient's condition and review of old charts   (including critical care time)  Medications Ordered in ED Medications - No data to display   Initial Impression / Assessment and Plan / ED Course  I have reviewed the triage vital signs and the nursing notes.  Pertinent labs & imaging results that were available during my care of the patient were reviewed by me and considered in my medical decision making (see chart for details). Vitals:   04/22/18 1608 04/22/18 1615  BP: 109/79 99/73  Pulse: (!) 56 (!) 57  Resp: 12 11  Temp: 97.7 F (36.5 C)   SpO2: 100% 100%       1-dyspnea- likely multifactorial- patient with ho endocarditis and appears volume overloaded with peripheral edema.  Hemoglobin drifting down with decreased to 7.2 from prior of 9.5.  Patchy nodular opacities seen on cxr.   2-aki - worsening renal function 3- endocarditis/mrsa bacteremia- patient was most recently on ceftaroline for disseminated mrsa  4- hyperkalemia- plan kayexalate 5-pancytopenia   Discussed with hospitalist- Lurena Nida Discussed with ID- Dr. Evelina Bucy- plan ceftaroline as patient with [facility] and poor renal function.  Dr. Evelina Bucy advises first dose 600mg  with pharmacy to consult re dosing.  She will see in consult in a.m. Final Clinical Impressions(s) / ED Diagnoses   Final diagnoses:  MRSA bacteremia  Dyspnea, unspecified type  Pancytopenia [facility])    ED Discharge Orders    None       Pattricia Boss, MD 04/22/18 1755

## 2018-04-22 NOTE — ED Notes (Signed)
Pt verbal consent to place an EJ.

## 2018-04-22 NOTE — ED Notes (Signed)
Attempted report at 6:40. No answer.

## 2018-04-22 NOTE — ED Notes (Addendum)
Pt was in restroom for about 20 mins. and went in to get the patient and found her with needle in hand. Pt refusing to give up her stuff to me. Going to give to her mother she stated. Pt is now requesting to go home and take oral antibiotics. Md made aware of.

## 2018-04-22 NOTE — ED Triage Notes (Signed)
Pt presents with 1 week h/o BLE edema, pt was admitted before that for same, pt reports when she was discharged, her swelling had decreased but increased when she got home.  Pt reports swelling extends into her abdomen. +shortness of breath

## 2018-04-22 NOTE — H&P (Signed)
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 female with medical history significant for ongoing IV drug abuse as well as tobacco abuse.  Patient left hospital on 6/26 after being treated for MRSA bacteremia with associated endocarditis and septic PE.  MRI performed on 6/25 revealed sacral epidural abscess with osteomyelitis as well as T12/L1 abscesses.  Because she left AMA she did not receive any additional antibiotics at time of discharge.  She continues to have back pain.  She was followed by multiple consultants during hospitalization including ID who assisted in the management of her antibiotics.  At time of discharge she was on ceftaroline for her endocarditis.  The hospital today complaining of lower extremity swelling that has increased since coming home.  She reports the swelling extends into her abdomen and she told the triage nurse he was having shortness of breath although she told me she has not had any shortness of breath, fevers, chills, chest pain or abdominal pain.  She does admit to utilizing frequent doses of ibuprofen for her back pain since discharge.  Work was revealing for mild hyperkalemia potassium 5.3 with worsening of chronic kidney disease BUN 40 creatinine 2.43 with a GFR of 26, notable for pancytopenia with worsening Hgb down to 7.2 from 9.5.  Platelets are stable and typically run between 110,000 to 130,000 for this patient.  This was abnormal and positive for hemoglobin and RBCs as well as WBCs.  Cultures were obtained in the  ER.  She was also given 40 mg of IV Lasix x1 for her lower extremity edema.  Echocardiogram performed during the previous admission reveals normal LVEF with tricuspid vegetation that was mobile as well as pulmonary hypertension.  ED Course:  Vital Signs: BP 98/78   Pulse 83   Temp 97.7 F (36.5 C) (Oral)   Resp 11   SpO2 99%  CXR: No significant interval change.  Stable patchy nodular opacities bilaterally better than previous CT of the chest.  No acute lung findings. Lab data: 138, potassium 5.3, chloride 108, CO2 21, glucose 106, BUN 40, creatinine 2.43, anion gap 9, albumin 2, BNP 357, white count 2200 refills 55% and absolute neutrophils 1.3%, hemoglobin 7.2 with normal MCV, platelets 115,000, urinalysis abnormal and as described above. Medications and treatments: Lasix 40 mg IV x1, Kayexalate 15 g x 1  Review of Systems:  In addition to the HPI above,  No Fever-chills, myalgias or other constitutional symptoms No Headache, changes with Vision or hearing, new weakness, tingling, numbness in any extremity, dizziness, dysarthria or word finding difficulty, gait disturbance or imbalance, tremors or seizure activity No problems swallowing food or Liquids, indigestion/reflux, choking or coughing while eating, abdominal pain with or after eating No Chest pain, Cough; ??Shortness of Breath, no palpitations, orthopnea or DOE No Abdominal pain, N/V, melena,hematochezia, dark tarry stools, constipation No dysuria, malodorous urine, hematuria or flank pain No new skin rashes, lesions, masses or bruises, No new joint pains, aches, swelling or redness No recent unintentional weight gain or loss No polyuria, polydypsia or polyphagia   Past Medical History:  Diagnosis Date  .  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 Abscess of skin    buttock - most recently 2009/10  . CKD (chronic kidney disease) stage 3, GFR 30-59 ml/min (HCC)   . Drug-seeking behavior   . Hypersplenism   . IV drug  user   . MRSA (methicillin resistant Staphylococcus aureus) infection   . MRSA bacteremia   . Pancytopenia ([place])   . 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 Septic pulmonary embolism ([place])   . Tobacco abuse     Past Surgical History:  Procedure Laterality Date  . IR GENERIC HISTORICAL  07/19/2016   IR LUMBAR DISC ASPIRATION W/IMG GUIDE 07/19/2016 Arne Cleveland, MD [facility]INTER[facility] RAD  . RADIOLOGY WITH ANESTHESIA N/A 04/11/2018   Procedure: MRI OF LUMBAR AND THORACIC SPINE WITHOUT CONTRAST WITH ANESTHESIA;  Surgeon: Radiologist, Medication, MD;  Location: [facility];  Service: Radiology;  Laterality: N/A;  . TEE WITHOUT CARDIOVERSION N/A 07/04/2013   Procedure: TRANSESOPHAGEAL ECHOCARDIOGRAM (TEE);  Surgeon: Larey Dresser, MD;  Location: [facility] ENDOSCOPY;  Service: Cardiovascular;  Laterality: N/A;  . TONSILLECTOMY     32 years old    Social History   Socioeconomic History  . Marital status: Single    Spouse name: Not on file  . Number of children: Not on file  . Years of education: Not on file  . Highest education level: Not on file  Occupational History  . Not on file  Social Needs  . Financial resource strain: Not on file  . Food insecurity:    Worry: Not on file    Inability: Not on file  . Transportation needs:    Medical: Not on file    Non-medical: Not on file  Tobacco Use  . Smoking status: Current Every Day Smoker    Packs/day: 0.50    Years: 7.00    Pack years: 3.50    Types: Cigarettes  . Smokeless tobacco: Never Used  . Tobacco comment: slowing down  Substance and Sexual Activity  . Alcohol use: No  . Drug use: Yes    Types: IV, Oxycodone    Comment: heroin  . Sexual activity: Yes    Partners: Male    Birth control/protection: IUD  Lifestyle  . Physical activity:    Days per week: Not on file    Minutes per session: Not on file  . Stress: Not on file  Relationships  . Social connections:    Talks on phone: Not on file    Gets together: Not on file     Attends religious service: Not on file    Active member of club or organization: Not on file    Attends meetings of clubs or organizations: Not on file    Relationship status: Not on file  . Intimate partner violence:    Fear of current or ex partner: Not on file    Emotionally abused: Not on file    Physically abused: Not on file    Forced sexual activity: Not on file  Other Topics Concern  . Not on file  Social History Narrative  . Not on file    Mobility: Independent Work history: Unemployed   Allergies  Allergen Reactions  . Vancomycin Other (See Comments)    Possible contributor to AKI and thrombocytopenia    Family History  Problem Relation Age of Onset  . Alcoholism Father      Prior to Admission medications   Medication Sig Start Date End Date Taking? Authorizing Provider  ibuprofen (ADVIL,MOTRIN) 200 MG tablet Take 200-400 mg by mouth every 6 (six) hours as needed for moderate pain.    Yes [provider]  senna-docusate (SENOKOT-S) 8.6-50 MG tablet Take 1 tablet by mouth at bedtime as needed for mild constipation. Patient not taking: Reported on 07/12/2017 07/21/16   Florinda Marker, MD    Physical Exam: Vitals:   04/22/18 1415 04/22/18 1608 04/22/18 1615 04/22/18 1715  BP: 106/79 109/79 99/73 98/78   Pulse: 67 (!) 56 (!) 57 83  Resp: 16 12 11 11   Temp: 98.2 F (36.8 C) 97.7 F (36.5 C)    TempSrc: Oral Oral    SpO2: 96% 100% 100% 99%      Constitutional: NAD, calm, comfortable-appears older than stated age Eyes: PERRL, lids and conjunctivae normal ENMT: Mucous membranes are dry. Posterior pharynx clear of any exudate or lesions. Poor dentition.  Neck: normal, supple, no masses, no thyromegaly Respiratory: clear to auscultation bilaterally, no wheezing, no crackles. Normal respiratory effort. No accessory muscle use.  Cardiovascular: Regular rate and rhythm, no murmurs / rubs / gallops.  Marked 4+ bilateral extremity edema extending to above  the hip line at the lower abdomen. 2+ pedal pulses. No carotid bruits.  Abdomen: Focal LUQ/left side tenderness guarding or rebounding, no masses palpated. No hepatomegaly notable for marked splenomegaly with splenic border extending down below the umbilicus on the left side and this very tender to palpation is noted-abdomen soft and distended. Bowel sounds positive.  Musculoskeletal: no clubbing / cyanosis. No joint deformity upper and lower extremities. Good ROM, no contractures. Normal muscle tone.  Skin: no ulcers or induration-distant but improved petechial rash on extremities Neurologic: CN 2-12 grossly intact. Sensation intact, DTR normal. Strength 5/5 x all 4 extremities.  Psychiatric:  Alert and oriented x 3. Normal mood.    Labs on Admission: I have personally reviewed following labs and imaging studies  CBC: Recent Labs  Lab 04/22/18 1449  WBC 2.2*  NEUTROABS 1.3*  HGB 7.2*  HCT 24.9*  MCV 89.2  PLT 240*   Basic Metabolic Panel: Recent Labs  Lab 04/22/18 1449  NA 138  K 5.3*  CL 108  CO2 21*  GLUCOSE 106*  BUN 40*  CREATININE 2.43*  CALCIUM 8.2*   GFR: Estimated Creatinine Clearance: 25.5 mL/min (A) (by C-G formula based on SCr of 2.43 mg/dL (H)). Liver Function Tests: Recent Labs  Lab 04/22/18 1449  AST 17  ALT 10  ALKPHOS 74  BILITOT 0.5  PROT 7.2  ALBUMIN 2.0*   No results for input(s): LIPASE, AMYLASE in the last 168 hours. No results for input(s): AMMONIA in the last 168 hours. Coagulation Profile: No results for input(s): INR, PROTIME in the last 168 hours. Cardiac Enzymes: No results for input(s): CKTOTAL, CKMB, CKMBINDEX, TROPONINI in the last 168 hours. BNP (last 3 results) No results for input(s): PROBNP in the last 8760 hours. HbA1C: No results for input(s): HGBA1C in the last 72 hours. CBG: No results for input(s): GLUCAP in the last 168 hours. Lipid Profile: No results for input(s): CHOL, HDL, LDLCALC, TRIG, CHOLHDL, LDLDIRECT in the  last 72 hours. Thyroid Function Tests: No results for input(s): TSH, T4TOTAL, FREET4, T3FREE, THYROIDAB in the last 72 hours. Anemia Panel: No results for input(s): VITAMINB12, FOLATE, FERRITIN, TIBC, IRON, RETICCTPCT in the last 72 hours. Urine analysis:    Component Value Date/Time   COLORURINE YELLOW 04/22/2018 1611   APPEARANCEUR CLOUDY (A) 04/22/2018 1611   LABSPEC 1.017 04/22/2018 1611  PHURINE 5.0 04/22/2018 1611   GLUCOSEU NEGATIVE 04/22/2018 1611   HGBUR LARGE (A) 04/22/2018 1611   BILIRUBINUR NEGATIVE 04/22/2018 1611   KETONESUR NEGATIVE 04/22/2018 1611   PROTEINUR 100 (A) 04/22/2018 1611   NITRITE NEGATIVE 04/22/2018 1611   LEUKOCYTESUR NEGATIVE 04/22/2018 1611   Sepsis Labs: @LABRCNTIP (procalcitonin:4,lacticidven:4) )No results found for this or any previous visit (from the past 240 hour(s)).   Radiological Exams on Admission: Dg Chest Port 1 View  Result Date: 04/22/2018 CLINICAL DATA:  Insert clinical EXAM: PORTABLE CHEST 1 VIEW COMPARISON:  Chest x-rays dated 04/11/2018 and 11/27/2015. FINDINGS: Stable cardiomegaly. Stable elevation of the RIGHT hemidiaphragm. Patchy nodular opacities are again seen bilaterally, not appreciably changed in the interval, better demonstrated on earlier chest CT. No new lung findings. No pleural effusion or pneumothorax seen. IMPRESSION: No significant interval change. Patchy nodular opacities again seen bilaterally, better demonstrated on chest CT of 04/11/2018, described as septic emboli on CT report. No new lung findings. Cardiomegaly appears grossly stable. Electronically Signed   By: Franki Cabot M.D.   On: 04/22/2018 16:31    EKG: (Independently reviewed) sinus rhythm with bradycardic rate 52 bpm, QTC 437 ms, normal R wave rotation, no acute ischemic changes  Assessment/Plan Principal Problem:   Bacteremia due to methicillin resistant Staphylococcus aureus -Patient returns to the hospital after leaving AMA in context of known MRSA  bacteremia with multiple complications as listed below -Resume ceftaroline -Await formal ID consultation -Patient noted with poor IV access to sites have been obtained in the ER -Currently afebrile -Blood cultures repeated in ER  Active Problems:   IV drug user -Extensively counseled regarding absolute cessation on multiple occasions -Patient last used heroin yesterday    Acute septic pulmonary embolism with acute cor pulmonale (June 2019) -Patient currently asymptomatic without chest pain or shortness of breath or hypoxemia -Treat underlying causes -Echo during previous admission with pulmonary hypertension 55 mmHg    Tricuspid valve endocarditis -Echo previous admission demonstrated a 1.25 x 0.34 cm mobile vegetation on the atrial side of the tricuspid valve with mild to moderate regurgitation -Evaluated by cardiology during previous admission; consideration given to possible surgical evaluation but given the ongoing drug abuse he is not a good candidate given high likelihood of recurrence of IVDA related bacteremia and anticipated recurrence of endocarditis    Spinal epidural abscess: Sacral w/ osteomyelitis and T12/L1 -Noted on MRI 6/24 -Evaluated by neurosurgery during previous admission with recommendation for continued IV antibiotic treatment and no indications for surgical intervention -Patient continues with low back pain and is neurologically intact -Tylenol for mild pain and Ultram for severe pain -No IV narcotics -Unable to use NSAIDs secondary to CKD    Pancytopenia, acquired/Hypersplenism -Based on serial CT scans patient had normal-appearing spleen February 2017 w/ imaging in October 2017 revealing 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 -Spleen has increased significantly in size and on recent renal ultrasound was noted to be quite large at 153 cm -Patient underwent extensive autoimmune work-up last admission which was negative -Patient does have worsening  anemia which is likely related to splenic consumption but for completeness of evaluation will rule out possible hemolysis so we will check anemia panel, LDH and haptoglobin again -Patient hemodynamically stable so no indication to transfuse PRBCs at this point -Platelets are greater than 20,000 and patient is not actively bleeding so no indication to transfuse platelets either    CKD (chronic kidney disease) stage 3, GFR 30-59 ml/min  -Renal function was completely normal January  2018 -At presentation June 2019 BUN was 85 and creatinine was 3.81 -On the date patient left AMA BUN was 51 and creatinine was 1.54 with a GFR of 44 -Current renal function and is 40 with a creatinine of 2.43 -Patient admits to continued use of NSAIDs at home which is likely contributing to abnormal renal function -Avoid nephrotoxic agents -Continue to follow renal function -Renal ultrasound last admission did reveal a component of medical renal disease     Bilateral lower extremity edema -Possibly related to nephrotic syndrome although protein only mildly elevated at 100 -More concerning is that patient may have atypical inferior vena caval syndrome secondary to mechanical compression from hypersplenism causing DVT -Lower extremity venous duplex    Abnormal urinalysis -Likely secondary to acute kidney injury on chronic kidney disease -Appearance is suggestive of UTI so check urine culture -No fever or leukocytosis so would not institute empiric antibiotics at this juncture    Tobacco abuse/Noncompliance -Patient apparently left AMA during previous hospitalization because she was not allowed to go outside and smoke with IV PCA in place -Patient has agreed to not asked to go outside and smoke during this admission -Nicotine patch ordered     **Additional lab, imaging and/or diagnostic evaluation at discretion of supervising physician  DVT prophylaxis: SQ heparin Code Status: Full Family Communication: No  family at bedside Disposition Plan: TBD Consults called: Infectious disease on call    Jessa Stinson L. ANP-BC [facility] Pager [phone]   If 7PM-7AM, please contact night-coverage [email or web address] Password [email or web address]  04/22/2018, 6:20 PM

## 2018-04-23 ENCOUNTER — Inpatient Hospital Stay (HOSPITAL_COMMUNITY): Payer: Self-pay

## 2018-04-23 DIAGNOSIS — R609 Edema, unspecified: Secondary | ICD-10-CM

## 2018-04-23 LAB — FOLATE: Folate: 6.9 ng/mL (ref >5.9)

## 2018-04-23 LAB — COMPREHENSIVE METABOLIC PANEL
ALBUMIN: 2.1 g/dL — AB (ref 3.5–5.0)
ALT: 11 U/L (ref 0–44)
ANION GAP: 3 — AB (ref 5–15)
AST: 19 U/L (ref 15–41)
Alkaline Phosphatase: 70 U/L (ref 38–126)
BUN: 36 mg/dL — ABNORMAL HIGH (ref 6–20)
CHLORIDE: 112 mmol/L — AB (ref 98–111)
CO2: 22 mmol/L (ref 22–32)
Calcium: 7.8 mg/dL — ABNORMAL LOW (ref 8.9–10.3)
Creatinine, Ser: 2.53 mg/dL — ABNORMAL HIGH (ref 0.44–1.00)
GFR calc non Af Amer: 24 mL/min — ABNORMAL LOW (ref >60)
GFR, EST AFRICAN AMERICAN: 28 mL/min — AB (ref >60)
GLUCOSE: 102 mg/dL — AB (ref 70–99)
Potassium: 5 mmol/L (ref 3.5–5.1)
SODIUM: 137 mmol/L (ref 135–145)
Total Bilirubin: 0.6 mg/dL (ref 0.3–1.2)
Total Protein: 7.4 g/dL (ref 6.5–8.1)

## 2018-04-23 LAB — RETICULOCYTES
RBC.: 2.18 MIL/uL — ABNORMAL LOW (ref 3.87–5.11)
Retic Count, Absolute: 21.8 10*3/uL (ref 19.0–186.0)
Retic Ct Pct: 1 % (ref 0.4–3.1)

## 2018-04-23 LAB — URINE CULTURE: CULTURE: NO GROWTH

## 2018-04-23 LAB — IRON AND TIBC
IRON: 37 ug/dL (ref 28–170)
SATURATION RATIOS: 18 % (ref 10.4–31.8)
TIBC: 211 ug/dL — AB (ref 250–450)
UIBC: 174 ug/dL

## 2018-04-23 LAB — VITAMIN B12: Vitamin B-12: 700 pg/mL (ref 180–914)

## 2018-04-23 LAB — FERRITIN: Ferritin: 170 ng/mL (ref 11–307)

## 2018-04-23 LAB — HAPTOGLOBIN: HAPTOGLOBIN: 135 mg/dL (ref 34–200)

## 2018-04-23 MED ORDER — SODIUM CHLORIDE 0.9 % IV SOLN
400.0000 mg | Freq: Two times a day (BID) | INTRAVENOUS | Status: DC
  Administered 2018-04-23: 400 mg via INTRAVENOUS
  Filled 2018-04-23 (×2): qty 400

## 2018-04-23 MED ORDER — BUPRENORPHINE HCL-NALOXONE HCL 2-0.5 MG SL SUBL
1.0000 | SUBLINGUAL_TABLET | Freq: Every day | SUBLINGUAL | Status: DC
Start: 2018-04-23 — End: 2018-04-23
  Filled 2018-04-23: qty 1

## 2018-04-23 NOTE — Progress Notes (Signed)
VASCULAR LAB PRELIMINARY  PRELIMINARY  PRELIMINARY  PRELIMINARY  Bilateral lower extremity venous duplex completed.    Preliminary report:  There is no DVT or SVT noted in the bilateral lower extremities.  There is significant interstitial fluid noted throughout the bilateral lower extremities.   Lareen Mullings, RVT 04/23/2018, 10:10 AM

## 2018-04-23 NOTE — Consult Note (Addendum)
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 female PWID who has hx of TV endocarditis, thoracic-lumbar discitis and most recently admitted for MRSA bacteremia found to have TV vegetation, septic pulmonary emboli and large sacral ventral epidural abscess, thoracic-lumbar paraspinal abscess, sacral osteo in June 2019, where she left AMA on 6/26 in the setting of ongoing bacteremia. She has been readmitted on 7/6 with pancytopenia, wbc of 2.2, ANC 1.3, hgb 7.2, and plt 115 (her baseline). Afebrile, but worsening backpain for which she has been taking ibuprofen. In addn, worsening lower extremity edema.her labs reveal acute on chronic kidney disease- cr at 2.53( vs 1.54 at discharge at last hospitalization). During her first night, she was also caught having needles in her possession concerning for using IV drugs while hospitalized.  Past Medical History:  Diagnosis Date  . 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 Abscess of skin    buttock - most recently 2009/10  . CKD (chronic kidney disease) stage 3, GFR 30-59 ml/min (HCC)   . Drug-seeking behavior   . Hypersplenism   . IV drug user   . MRSA (methicillin resistant Staphylococcus aureus) infection   . MRSA bacteremia   . Pancytopenia ([place])   . 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 Septic  pulmonary embolism ([place])   . Tobacco abuse     Allergies:  Allergies  Allergen Reactions  . Vancomycin Other (See Comments)    Possible contributor to AKI and thrombocytopenia    MEDICATIONS: . buprenorphine-naloxone  1 tablet Sublingual Daily  . heparin  5,000 Units Subcutaneous Q8H  . nicotine  21 mg Transdermal Daily    Social History   Tobacco Use  . Smoking status: Current Every Day Smoker    Packs/day: 0.50    Years: 7.00    Pack years: 3.50    Types: Cigarettes  . Smokeless tobacco: Never Used  . Tobacco comment: slowing down  Substance Use Topics  . Alcohol use: No  . Drug use: Yes    Types: IV, Oxycodone    Comment: heroin    Family History  Problem Relation Age of Onset  . Alcoholism Father       OBJECTIVE: Temp:  [97.7 F (36.5 C)-98.2 F (36.8 C)] 98 F (36.7 C) (07/06 2013) Pulse Rate:  [48-83] 54 (07/06 2013) Resp:  [11-18] 14 (07/06 2013) BP: (98-116)/(73-83) 109/83 (07/06 2013) SpO2:  [96 %-100 %] 100 % (07/06 2013) Weight:  [120 lb 9.5 oz (54.7 kg)] 120 lb 9.5 oz (54.7 kg) (07/07 0500)  leaving ama   LABS: Results for orders placed or performed during the hospital encounter of 04/22/18 (from the past 48 hour(s))  CBC with Differential  Status: Abnormal   Collection Time: 04/22/18  2:49 PM  Result Value Ref Range   WBC 2.2 (L) 4.0 - 10.5 K/uL   RBC 2.79 (L) 3.87 - 5.11 MIL/uL   Hemoglobin 7.2 (L) 12.0 - 15.0 g/dL    Comment: REPEATED TO VERIFY   HCT 24.9 (L) 36.0 - 46.0 %   MCV 89.2 78.0 - 100.0 fL   MCH 25.8 (L) 26.0 - 34.0 pg   MCHC 28.9 (L) 30.0 - 36.0 g/dL   RDW 18.4 (H) 11.5 - 15.5 %   Platelets 115 (L) 150 - 400 K/uL    Comment: REPEATED TO VERIFY CONSISTENT WITH PREVIOUS RESULT    Neutrophils Relative % 55 %   Lymphocytes Relative 33 %   Monocytes Relative 11 %   Eosinophils Relative 0 %   Basophils Relative 1 %   Neutro Abs 1.3 (L) 1.7 - 7.7 K/uL   Lymphs Abs 0.7 0.7 - 4.0 K/uL   Monocytes Absolute 0.2 0.1 - 1.0  K/uL   Eosinophils Absolute 0.0 0.0 - 0.7 K/uL   Basophils Absolute 0.0 0.0 - 0.1 K/uL    Comment: Performed at [facility], [address] [place], [place] [address]  Comprehensive metabolic panel     Status: Abnormal   Collection Time: 04/22/18  2:49 PM  Result Value Ref Range   Sodium 138 135 - 145 mmol/L   Potassium 5.3 (H) 3.5 - 5.1 mmol/L   Chloride 108 98 - 111 mmol/L    Comment: Please note change in reference range.   CO2 21 (L) 22 - 32 mmol/L   Glucose, Bld 106 (H) 70 - 99 mg/dL    Comment: Please note change in reference range.   BUN 40 (H) 6 - 20 mg/dL    Comment: Please note change in reference range.   Creatinine, Ser 2.43 (H) 0.44 - 1.00 mg/dL   Calcium 8.2 (L) 8.9 - 10.3 mg/dL   Total Protein 7.2 6.5 - 8.1 g/dL   Albumin 2.0 (L) 3.5 - 5.0 g/dL   AST 17 15 - 41 U/L   ALT 10 0 - 44 U/L    Comment: Please note change in reference range.   Alkaline Phosphatase 74 38 - 126 U/L   Total Bilirubin 0.5 0.3 - 1.2 mg/dL   GFR calc non Af Amer 26 (L) >60 mL/min   GFR calc Af Amer 30 (L) >60 mL/min    Comment: (NOTE) The eGFR has been calculated using the CKD EPI equation. This calculation has not been validated in all clinical situations. eGFR's persistently <60 mL/min signify possible Chronic Kidney Disease.    Anion gap 9 5 - 15    Comment: Performed at [facility] [address], [place], [place] [address]  Brain natriuretic peptide     Status: Abnormal   Collection Time: 04/22/18  2:49 PM  Result Value Ref Range   B Natriuretic Peptide 357.0 (H) 0.0 - 100.0 pg/mL    Comment: Performed at [facility] [address] [place], [place] [address]  Urinalysis, Routine w reflex microscopic     Status: Abnormal   Collection Time: 04/22/18  4:11 PM  Result Value Ref Range   Color, Urine YELLOW YELLOW   APPearance CLOUDY (A) CLEAR   Specific Gravity, Urine 1.017 1.005 - 1.030   pH 5.0 5.0 - 8.0   Glucose, UA NEGATIVE NEGATIVE mg/dL   Hgb urine  dipstick LARGE (A) NEGATIVE   Bilirubin  Urine NEGATIVE NEGATIVE   Ketones, ur NEGATIVE NEGATIVE mg/dL   Protein, ur 100 (A) NEGATIVE mg/dL   Nitrite NEGATIVE NEGATIVE   Leukocytes, UA NEGATIVE NEGATIVE   RBC / HPF 21-50 0 - 5 RBC/hpf   WBC, UA 11-20 0 - 5 WBC/hpf   Bacteria, UA RARE (A) NONE SEEN   Squamous Epithelial / LPF 0-5 0 - 5   Mucus PRESENT    Ca Oxalate Crys, UA PRESENT     Comment: Performed at [facility] [address] [place], [place] [address]  Sodium, urine, random     Status: None   Collection Time: 04/22/18  4:11 PM  Result Value Ref Range   Sodium, Ur 61 mmol/L    Comment: Performed at [facility] [address], [place], [place] [address]  Lactate dehydrogenase     Status: None   Collection Time: 04/22/18  6:01 PM  Result Value Ref Range   LDH 181 98 - 192 U/L    Comment: Performed at [facility], [place] [address], [place], [place] [address]  Comprehensive metabolic panel     Status: Abnormal   Collection Time: 04/23/18  8:59 AM  Result Value Ref Range   Sodium 137 135 - 145 mmol/L   Potassium 5.0 3.5 - 5.1 mmol/L   Chloride 112 (H) 98 - 111 mmol/L    Comment: Please note change in reference range.   CO2 22 22 - 32 mmol/L   Glucose, Bld 102 (H) 70 - 99 mg/dL    Comment: Please note change in reference range.   BUN 36 (H) 6 - 20 mg/dL    Comment: Please note change in reference range.   Creatinine, Ser 2.53 (H) 0.44 - 1.00 mg/dL   Calcium 7.8 (L) 8.9 - 10.3 mg/dL   Total Protein 7.4 6.5 - 8.1 g/dL   Albumin 2.1 (L) 3.5 - 5.0 g/dL   AST 19 15 - 41 U/L   ALT 11 0 - 44 U/L    Comment: Please note change in reference range.   Alkaline Phosphatase 70 38 - 126 U/L   Total Bilirubin 0.6 0.3 - 1.2 mg/dL   GFR calc non Af Amer 24 (L) >60 mL/min   GFR calc Af Amer 28 (L) >60 mL/min    Comment: (NOTE) The eGFR has been calculated using the CKD EPI equation. This calculation has not been validated in all clinical situations. eGFR's  persistently <60 mL/min signify possible Chronic Kidney Disease.    Anion gap 3 (L) 5 - 15    Comment: Performed at [facility], [place] [address] [place], [place] [address]  Vitamin B12     Status: None   Collection Time: 04/23/18  8:59 AM  Result Value Ref Range   Vitamin B-12 700 180 - 914 pg/mL    Comment: (NOTE) This assay is not validated for testing neonatal or myeloproliferative syndrome specimens for Vitamin B12 levels. Performed at [facility], [place] [address] [place], [place] [address]   Folate     Status: None   Collection Time: 04/23/18  8:59 AM  Result Value Ref Range   Folate 6.9 >5.9 ng/mL    Comment: Performed at [facility] [address], [place], [place] [address]  Iron and TIBC     Status: Abnormal   Collection Time: 04/23/18  8:59 AM  Result Value Ref Range   Iron 37 28 - 170 ug/dL  TIBC 211 (L) 250 - 450 ug/dL   Saturation Ratios 18 10.4 - 31.8 %   UIBC 174 ug/dL    Comment: Performed at [facility], [place] [address] [place], [place] [address]  Ferritin     Status: None   Collection Time: 04/23/18  8:59 AM  Result Value Ref Range   Ferritin 170 11 - 307 ng/mL    Comment: Performed at [facility], [place] [address], [place], [place] [address]  Reticulocytes     Status: Abnormal   Collection Time: 04/23/18  8:59 AM  Result Value Ref Range   Retic Ct Pct 1.0 0.4 - 3.1 %   RBC. 2.18 (L) 3.87 - 5.11 MIL/uL   Retic Count, Absolute 21.8 19.0 - 186.0 K/uL    Comment: Performed at [facility] [address] [place], [place] [address]    MICRO: 7/6 blood cx pending 7/7 blood cx pending IMAGING: Dg Chest Port 1 View  Result Date: 04/22/2018 CLINICAL DATA:  Insert clinical EXAM: PORTABLE CHEST 1 VIEW COMPARISON:  Chest x-rays dated 04/11/2018 and 11/27/2015. FINDINGS: Stable cardiomegaly. Stable elevation of the RIGHT hemidiaphragm. Patchy nodular opacities are again seen bilaterally, not appreciably changed in the  interval, better demonstrated on earlier chest CT. No new lung findings. No pleural effusion or pneumothorax seen. IMPRESSION: No significant interval change. Patchy nodular opacities again seen bilaterally, better demonstrated on chest CT of 04/11/2018, described as septic emboli on CT report. No new lung findings. Cardiomegaly appears grossly stable. Electronically Signed   By: Franki Cabot M.D.   On: 04/22/2018 16:31   Assessment/Plan:  32yo F with PWID, MRSA TV endocarditis c/b pulm septic emboli, vertebral osteo/paraspinal abscess/sacral epidural abscess in June 2019 that is partially treated. Admitted with pancytopenia and AKI  aki = suspect she has been over using nsaids to treat pain that could attribute to worsening aki. Recommend to continue with ivf. Will renally adjust her medications  mrsa infection = for now can do ceftaroline to treat lung, and discitis. Once cr improved can switch to vancomycin vs. linezolid and watch platelets closely  Pancytopenia/splenomegaly = if we continue to treat underlying disease, possibly will improve  Epidural abscess= last evaluated 2 wk ago, and recommendation for medical management  Opiate dependence = agree with suboxone to help with cravings  LE edema = likely combination of poor nutrition thus element of anasarca as well as possibly element of heart failure/ valve incompetence from MRSA SBE.  --------------- Addendum: leaving ama ---- anticipate that she will present back to the ED shortly if she still has bacteremia

## 2018-04-23 NOTE — Progress Notes (Addendum)
Nurse called to room.  LFA IV already removed by patient.  Pressure applied to site until bleeding stopped, bandaged placed.  Removed LEJ IV and pressure applied and bandage placed.  Explained to patient the dangers of leaving AMA in her present condision.  Patient is verbally abusive and insists on leaving.  MD made aware.  Patient taken to main entrance via wheelchair to her mom's car.

## 2018-04-23 NOTE — Progress Notes (Signed)
Pt refused to have her lab work drawn last night.

## 2018-04-23 NOTE — Progress Notes (Addendum)
Patient refusing suboxone and heparin.  Will continue to monitor. MD aware.

## 2018-04-23 NOTE — Plan of Care (Signed)
  Problem: Education: Goal: Knowledge of General Education information will improve Outcome: Progressing   Problem: Clinical Measurements: Goal: Ability to maintain clinical measurements within normal limits will improve Outcome: Progressing   Problem: Pain Managment: Goal: General experience of comfort will improve Outcome: Progressing

## 2018-04-23 NOTE — Discharge Summary (Signed)
Patient left AMA.

## 2018-04-23 NOTE — Progress Notes (Signed)
Day nurse and night nurse performed skin assessment on patient.  Patient refuses to allow either nurse to inspect groin, buttocks, or peri areas.  Will monitor.

## 2018-04-23 NOTE — Progress Notes (Signed)
Witnessed Theme park manager through patients belongings, bed, room closets, bathroom, and bedside tablet.  No drugs or paraphernalia found.  Patient is the only person present in room other than staff.  Will continue to monitor.

## 2018-04-23 NOTE — Progress Notes (Signed)
Patient ID: Molly [name], female   DOB: 1986/06/12, 32 y.o.   MRN: [id number]  PROGRESS NOTE    Molly [name]  PIR:[id number] DOB: 1986/01/02 DOA: 04/22/2018 PCP: Patient, No Pcp Per    Brief Narrative: (Start on day 1 of progress note - keep it brief and live) 32 year old female with long-standing history of IV heroin abuse recently left AMA June 26 was being treated for MRSA bacteremia with associated endocarditis and septic PE and renal failure.  Patient appeared back in emergency department on 04/22/2018.  She was on a course of IV ceftaroline.  This has been resumed.   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 Graylon Good who agrees with resuming previous antibiotic regimen.  They will see in consultation. --Currently afebrile -Blood cultures repeated in ER    IV drug user -Patient was caught in the bathroom in her room last night with a needle.  Patient denies using any drugs while in the hospital.  We will have security search her belongings for any illegal substances or IV paraphernalia for her own safety.  Patient screaming and hollering profanities at me during her visit today.  Threatening to leave AMA again. -Consider starting Suboxone -6 N. nursing staff updated about plan of care.     Acute septic pulmonary embolism  with acute cor pulmonale (June 2019) -IV antibiotics as above    Tricuspid valve endocarditis -Echo previous admission demonstrated a 1.25 x 0.34 cm mobile vegetation on the atrial side of the tricuspid valve with mild to moderate regurgitation -Evaluated by cardiology during previous admission; consideration given to possible surgical evaluation but given the ongoing drug abuse he is not a good candidate given high likelihood of recurrence of IVDA related bacteremia and anticipated recurrence of endocarditis    Spinal epidural abscess: Sacral w/ osteomyelitis and T12/L1 -Noted on MRI 6/24 -Evaluated by neurosurgery during previous admission with recommendation for continued IV antibiotic treatment and no indications for surgical intervention -Patient continues with low back pain and is neurologically intact -Tylenol for mild pain and Ultram for severe pain -No IV narcotics -Unable to use NSAIDs secondary to CKD    Pancytopenia, acquired/Hypersplenism -Based on serial CT scans patient had normal-appearing spleen February 2017 w/ imaging in October 2017 revealing 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?? -Spleen has increased significantly in size and on recent renal ultrasound was noted to be quite large at 153 cm -Patient underwent extensive autoimmune work-up last admission which was negative -Patient does have worsening anemia which is likely related to splenic consumption but for completeness of evaluation will rule out possible hemolysis so we will check anemia panel, LDH and haptoglobin again -Patient hemodynamically stable so no indication to transfuse PRBCs at this point -Platelets are greater than 20,000 and patient is not actively bleeding so no indication to transfuse platelets either  CKD (chronic kidney disease) stage 3, GFR 30-59 ml/min  -Renal function was completely normal January 2018 -At presentation June 2019 BUN was 85 and creatinine was 3.81 -On the  date patient left AMA BUN was 51 and creatinine was 1.54 with a GFR of 44 -Current renal function and is 36 with a creatinine of 2.53 which is stable from admission -Patient admits to continued use of NSAIDs at home which is likely contributing to abnormal renal function -Avoid nephrotoxic agents -Continue to follow renal function -Renal ultrasound last admission did reveal a component of medical renal disease -Continue gentle IV fluids for now.  Patient refused labs last night so they are still pending.     Bilateral lower extremity edema -Likely related more towards her  overall poor nutritional status- -Lower extremity venous duplex negative     Tobacco abuse/Noncompliance -Patient apparently left AMA during previous hospitalization because she was not allowed to go outside and smoke with IV PCA in place -Patient has agreed to not asked to go outside and smoke during this admission -Nicotine patch ordered    DVT prophylaxis: Heparin  code Status: Full  family Communication: None  disposition Plan: Patient will be here for duration of her IV antibiotic therapy  Consultants:   ID  Subjective: Patient got very upset when I touched her.  Started screaming and hollering profanities at me with a lab tech in the room.  Attempted to calm her down unsuccessfully.  She denies using any IV drugs in the bathroom per nursing report last night.  Objective: Vitals:   04/22/18 1800 04/22/18 1923 04/22/18 2013 04/23/18 0500  BP: 116/78 102/76 109/83   Pulse: (!) 56 (!) 48 (!) 54   Resp: 18 11 14    Temp:   98 F (36.7 C)   TempSrc:   Oral   SpO2: 100% 100% 100%   Weight:    54.7 kg (120 lb 9.5 oz)    Intake/Output Summary (Last 24 hours) at 04/23/2018 0954 Last data filed at 04/23/2018 2542 Gross per 24 hour  Intake 1287.51 ml  Output -  Net 1287.51 ml   Filed Weights   04/23/18 0500  Weight: 54.7 kg (120 lb 9.5 oz)    Examination:  General exam: Upset and verbally  toxic Respiratory system: Clear to auscultation. Respiratory effort normal. Cardiovascular system: S1 & S2 heard, RRR. No JVD, murmurs, rubs, gallops or clicks. No pedal edema. Gastrointestinal system: Abdomen is nondistended, soft and nontender. No organomegaly or masses felt. Normal bowel sounds heard. Central nervous system: Alert and oriented. No focal neurological deficits. Extremities: Symmetric 5 x 5 power.  2+ pitting edema Skin: No rashes, lesions or ulcers Psychiatry: Judgement and insight appear  poor . Mood & affect are not appropriate.   Data Reviewed: I have personally reviewed following labs and imaging studies  CBC: Recent Labs  Lab 04/22/18 1449  WBC 2.2*  NEUTROABS 1.3*  HGB 7.2*  HCT 24.9*  MCV 89.2  PLT 706*   Basic Metabolic Panel: Recent Labs  Lab 04/22/18 1449  NA 138  K 5.3*  CL 108  CO2 21*  GLUCOSE 106*  BUN 40*  CREATININE 2.43*  CALCIUM 8.2*   GFR: Estimated Creatinine Clearance: 25.3 mL/min (A) (by C-G formula based on SCr of 2.43 mg/dL (H)). Liver Function Tests: Recent Labs  Lab 04/22/18 1449  AST 17  ALT 10  ALKPHOS 74  BILITOT 0.5  PROT 7.2  ALBUMIN 2.0*   No results for input(s): LIPASE,  AMYLASE in the last 168 hours. No results for input(s): AMMONIA in the last 168 hours. Coagulation Profile: No results for input(s): INR, PROTIME in the last 168 hours. Cardiac Enzymes: No results for input(s): CKTOTAL, CKMB, CKMBINDEX, TROPONINI in the last 168 hours. BNP (last 3 results) No results for input(s): PROBNP in the last 8760 hours. HbA1C: No results for input(s): HGBA1C in the last 72 hours. CBG: No results for input(s): GLUCAP in the last 168 hours. Lipid Profile: No results for input(s): CHOL, HDL, LDLCALC, TRIG, CHOLHDL, LDLDIRECT in the last 72 hours. Thyroid Function Tests: No results for input(s): TSH, T4TOTAL, FREET4, T3FREE, THYROIDAB in the last 72 hours. Anemia Panel: Recent Labs    04/23/18 0859  RETICCTPCT  1.0   Sepsis Labs: No results for input(s): PROCALCITON, LATICACIDVEN in the last 168 hours.  No results found for this or any previous visit (from the past 240 hour(s)).     Radiology Studies: Dg Chest Port 1 View  Result Date: 04/22/2018 CLINICAL DATA:  Insert clinical EXAM: PORTABLE CHEST 1 VIEW COMPARISON:  Chest x-rays dated 04/11/2018 and 11/27/2015. FINDINGS: Stable cardiomegaly. Stable elevation of the RIGHT hemidiaphragm. Patchy nodular opacities are again seen bilaterally, not appreciably changed in the interval, better demonstrated on earlier chest CT. No new lung findings. No pleural effusion or pneumothorax seen. IMPRESSION: No significant interval change. Patchy nodular opacities again seen bilaterally, better demonstrated on chest CT of 04/11/2018, described as septic emboli on CT report. No new lung findings. Cardiomegaly appears grossly stable. Electronically Signed   By: Franki Cabot M.D.   On: 04/22/2018 16:31        Scheduled Meds: . heparin  5,000 Units Subcutaneous Q8H  . nicotine  21 mg Transdermal Daily   Continuous Infusions: . sodium chloride 75 mL/hr at 04/23/18 0730  . ceFTAROline (TEFLARO) IV 300 mg (04/22/18 1921)     LOS: 1 day    Time spent: 25 minutes    DAVID,RACHAL A, MD [facility] Pager [phone]  If 7PM-7AM, please contact night-coverage [email or web address] Password [facility] 04/23/2018, 9:54 AM

## 2018-04-25 LAB — PATHOLOGIST SMEAR REVIEW

## 2018-04-27 LAB — CULTURE, BLOOD (ROUTINE X 2): Culture: NO GROWTH

## 2018-04-28 LAB — CULTURE, BLOOD (ROUTINE X 2)
CULTURE: NO GROWTH
Special Requests: ADEQUATE

## 2019-01-20 ENCOUNTER — Inpatient Hospital Stay (HOSPITAL_COMMUNITY): Payer: Medicaid Other

## 2019-01-20 ENCOUNTER — Emergency Department (HOSPITAL_COMMUNITY): Payer: Medicaid Other

## 2019-01-20 ENCOUNTER — Inpatient Hospital Stay: Payer: Self-pay

## 2019-01-20 ENCOUNTER — Encounter (HOSPITAL_COMMUNITY): Payer: Self-pay | Admitting: Emergency Medicine

## 2019-01-20 ENCOUNTER — Inpatient Hospital Stay (HOSPITAL_COMMUNITY)
Admission: EM | Admit: 2019-01-20 | Discharge: 2019-03-06 | DRG: 004 | Disposition: A | Discharge status: Home / Self Care | Payer: Medicaid Other | Attending: Student | Admitting: Student

## 2019-01-20 ENCOUNTER — Other Ambulatory Visit: Payer: Self-pay

## 2019-01-20 DIAGNOSIS — J9600 Acute respiratory failure, unspecified whether with hypoxia or hypercapnia: Secondary | ICD-10-CM

## 2019-01-20 DIAGNOSIS — J189 Pneumonia, unspecified organism: Secondary | ICD-10-CM

## 2019-01-20 DIAGNOSIS — J9611 Chronic respiratory failure with hypoxia: Secondary | ICD-10-CM | POA: Diagnosis not present

## 2019-01-20 DIAGNOSIS — B999 Unspecified infectious disease: Secondary | ICD-10-CM

## 2019-01-20 DIAGNOSIS — F17210 Nicotine dependence, cigarettes, uncomplicated: Secondary | ICD-10-CM | POA: Diagnosis present

## 2019-01-20 DIAGNOSIS — L89300 Pressure ulcer of unspecified buttock, unstageable: Secondary | ICD-10-CM | POA: Clinically undetermined

## 2019-01-20 DIAGNOSIS — Z7189 Other specified counseling: Secondary | ICD-10-CM

## 2019-01-20 DIAGNOSIS — Z6822 Body mass index (BMI) 22.0-22.9, adult: Secondary | ICD-10-CM

## 2019-01-20 DIAGNOSIS — J9501 Hemorrhage from tracheostomy stoma: Secondary | ICD-10-CM | POA: Diagnosis not present

## 2019-01-20 DIAGNOSIS — E860 Dehydration: Secondary | ICD-10-CM

## 2019-01-20 DIAGNOSIS — D62 Acute posthemorrhagic anemia: Secondary | ICD-10-CM | POA: Diagnosis not present

## 2019-01-20 DIAGNOSIS — I361 Nonrheumatic tricuspid (valve) insufficiency: Secondary | ICD-10-CM

## 2019-01-20 DIAGNOSIS — G40401 Other generalized epilepsy and epileptic syndromes, not intractable, with status epilepticus: Secondary | ICD-10-CM | POA: Diagnosis not present

## 2019-01-20 DIAGNOSIS — Z930 Tracheostomy status: Secondary | ICD-10-CM

## 2019-01-20 DIAGNOSIS — E870 Hyperosmolality and hypernatremia: Secondary | ICD-10-CM | POA: Diagnosis not present

## 2019-01-20 DIAGNOSIS — J9601 Acute respiratory failure with hypoxia: Secondary | ICD-10-CM | POA: Diagnosis present

## 2019-01-20 DIAGNOSIS — R1310 Dysphagia, unspecified: Secondary | ICD-10-CM | POA: Diagnosis not present

## 2019-01-20 DIAGNOSIS — I330 Acute and subacute infective endocarditis: Secondary | ICD-10-CM | POA: Diagnosis present

## 2019-01-20 DIAGNOSIS — F1910 Other psychoactive substance abuse, uncomplicated: Secondary | ICD-10-CM

## 2019-01-20 DIAGNOSIS — F419 Anxiety disorder, unspecified: Secondary | ICD-10-CM | POA: Diagnosis present

## 2019-01-20 DIAGNOSIS — R6520 Severe sepsis without septic shock: Secondary | ICD-10-CM

## 2019-01-20 DIAGNOSIS — Z9114 Patient's other noncompliance with medication regimen: Secondary | ICD-10-CM

## 2019-01-20 DIAGNOSIS — A0472 Enterocolitis due to Clostridium difficile, not specified as recurrent: Secondary | ICD-10-CM | POA: Diagnosis not present

## 2019-01-20 DIAGNOSIS — Z781 Physical restraint status: Secondary | ICD-10-CM

## 2019-01-20 DIAGNOSIS — J158 Pneumonia due to other specified bacteria: Secondary | ICD-10-CM | POA: Diagnosis not present

## 2019-01-20 DIAGNOSIS — E43 Unspecified severe protein-calorie malnutrition: Secondary | ICD-10-CM | POA: Diagnosis not present

## 2019-01-20 DIAGNOSIS — J181 Lobar pneumonia, unspecified organism: Secondary | ICD-10-CM | POA: Diagnosis not present

## 2019-01-20 DIAGNOSIS — D696 Thrombocytopenia, unspecified: Secondary | ICD-10-CM

## 2019-01-20 DIAGNOSIS — I5031 Acute diastolic (congestive) heart failure: Secondary | ICD-10-CM | POA: Diagnosis not present

## 2019-01-20 DIAGNOSIS — E46 Unspecified protein-calorie malnutrition: Secondary | ICD-10-CM

## 2019-01-20 DIAGNOSIS — I130 Hypertensive heart and chronic kidney disease with heart failure and stage 1 through stage 4 chronic kidney disease, or unspecified chronic kidney disease: Secondary | ICD-10-CM | POA: Diagnosis not present

## 2019-01-20 DIAGNOSIS — I2690 Septic pulmonary embolism without acute cor pulmonale: Secondary | ICD-10-CM | POA: Diagnosis present

## 2019-01-20 DIAGNOSIS — Z789 Other specified health status: Secondary | ICD-10-CM

## 2019-01-20 DIAGNOSIS — N184 Chronic kidney disease, stage 4 (severe): Secondary | ICD-10-CM | POA: Diagnosis not present

## 2019-01-20 DIAGNOSIS — Z8614 Personal history of Methicillin resistant Staphylococcus aureus infection: Secondary | ICD-10-CM

## 2019-01-20 DIAGNOSIS — R52 Pain, unspecified: Secondary | ICD-10-CM

## 2019-01-20 DIAGNOSIS — N179 Acute kidney failure, unspecified: Secondary | ICD-10-CM

## 2019-01-20 DIAGNOSIS — Z881 Allergy status to other antibiotic agents status: Secondary | ICD-10-CM

## 2019-01-20 DIAGNOSIS — A4101 Sepsis due to Methicillin susceptible Staphylococcus aureus: Principal | ICD-10-CM | POA: Diagnosis present

## 2019-01-20 DIAGNOSIS — J155 Pneumonia due to Escherichia coli: Secondary | ICD-10-CM | POA: Diagnosis not present

## 2019-01-20 DIAGNOSIS — F19188 Other psychoactive substance abuse with other psychoactive substance-induced disorder: Secondary | ICD-10-CM | POA: Diagnosis present

## 2019-01-20 DIAGNOSIS — L8990 Pressure ulcer of unspecified site, unspecified stage: Secondary | ICD-10-CM

## 2019-01-20 DIAGNOSIS — R0600 Dyspnea, unspecified: Secondary | ICD-10-CM

## 2019-01-20 DIAGNOSIS — R6521 Severe sepsis with septic shock: Secondary | ICD-10-CM | POA: Diagnosis present

## 2019-01-20 DIAGNOSIS — I2601 Septic pulmonary embolism with acute cor pulmonale: Secondary | ICD-10-CM | POA: Diagnosis present

## 2019-01-20 DIAGNOSIS — E876 Hypokalemia: Secondary | ICD-10-CM | POA: Diagnosis not present

## 2019-01-20 DIAGNOSIS — Z452 Encounter for adjustment and management of vascular access device: Secondary | ICD-10-CM

## 2019-01-20 DIAGNOSIS — N3001 Acute cystitis with hematuria: Secondary | ICD-10-CM

## 2019-01-20 DIAGNOSIS — D72819 Decreased white blood cell count, unspecified: Secondary | ICD-10-CM

## 2019-01-20 DIAGNOSIS — D649 Anemia, unspecified: Secondary | ICD-10-CM

## 2019-01-20 DIAGNOSIS — I959 Hypotension, unspecified: Secondary | ICD-10-CM

## 2019-01-20 DIAGNOSIS — Z66 Do not resuscitate: Secondary | ICD-10-CM | POA: Diagnosis present

## 2019-01-20 DIAGNOSIS — F1120 Opioid dependence, uncomplicated: Secondary | ICD-10-CM | POA: Diagnosis not present

## 2019-01-20 DIAGNOSIS — D61818 Other pancytopenia: Secondary | ICD-10-CM | POA: Diagnosis present

## 2019-01-20 DIAGNOSIS — R7881 Bacteremia: Secondary | ICD-10-CM

## 2019-01-20 DIAGNOSIS — G9341 Metabolic encephalopathy: Secondary | ICD-10-CM | POA: Diagnosis present

## 2019-01-20 DIAGNOSIS — E874 Mixed disorder of acid-base balance: Secondary | ICD-10-CM | POA: Diagnosis not present

## 2019-01-20 DIAGNOSIS — D65 Disseminated intravascular coagulation [defibrination syndrome]: Secondary | ICD-10-CM | POA: Diagnosis present

## 2019-01-20 DIAGNOSIS — M545 Low back pain: Secondary | ICD-10-CM | POA: Diagnosis not present

## 2019-01-20 DIAGNOSIS — I76 Septic arterial embolism: Secondary | ICD-10-CM | POA: Diagnosis present

## 2019-01-20 DIAGNOSIS — Z86711 Personal history of pulmonary embolism: Secondary | ICD-10-CM

## 2019-01-20 DIAGNOSIS — A419 Sepsis, unspecified organism: Secondary | ICD-10-CM

## 2019-01-20 DIAGNOSIS — N189 Chronic kidney disease, unspecified: Secondary | ICD-10-CM

## 2019-01-20 DIAGNOSIS — Z4659 Encounter for fitting and adjustment of other gastrointestinal appliance and device: Secondary | ICD-10-CM

## 2019-01-20 DIAGNOSIS — Z791 Long term (current) use of non-steroidal anti-inflammatories (NSAID): Secondary | ICD-10-CM

## 2019-01-20 DIAGNOSIS — Z9119 Patient's noncompliance with other medical treatment and regimen: Secondary | ICD-10-CM

## 2019-01-20 DIAGNOSIS — G9340 Encephalopathy, unspecified: Secondary | ICD-10-CM | POA: Diagnosis present

## 2019-01-20 DIAGNOSIS — Z5329 Procedure and treatment not carried out because of patient's decision for other reasons: Secondary | ICD-10-CM | POA: Diagnosis present

## 2019-01-20 DIAGNOSIS — I6783 Posterior reversible encephalopathy syndrome: Secondary | ICD-10-CM | POA: Diagnosis present

## 2019-01-20 DIAGNOSIS — R410 Disorientation, unspecified: Secondary | ICD-10-CM

## 2019-01-20 DIAGNOSIS — D6489 Other specified anemias: Secondary | ICD-10-CM | POA: Diagnosis present

## 2019-01-20 DIAGNOSIS — I471 Supraventricular tachycardia: Secondary | ICD-10-CM | POA: Diagnosis not present

## 2019-01-20 DIAGNOSIS — B370 Candidal stomatitis: Secondary | ICD-10-CM | POA: Diagnosis not present

## 2019-01-20 DIAGNOSIS — R64 Cachexia: Secondary | ICD-10-CM | POA: Diagnosis not present

## 2019-01-20 DIAGNOSIS — J9690 Respiratory failure, unspecified, unspecified whether with hypoxia or hypercapnia: Secondary | ICD-10-CM

## 2019-01-20 DIAGNOSIS — M542 Cervicalgia: Secondary | ICD-10-CM | POA: Diagnosis not present

## 2019-01-20 DIAGNOSIS — Z7151 Drug abuse counseling and surveillance of drug abuser: Secondary | ICD-10-CM

## 2019-01-20 DIAGNOSIS — Z811 Family history of alcohol abuse and dependence: Secondary | ICD-10-CM

## 2019-01-20 DIAGNOSIS — D631 Anemia in chronic kidney disease: Secondary | ICD-10-CM | POA: Diagnosis present

## 2019-01-20 DIAGNOSIS — Z978 Presence of other specified devices: Secondary | ICD-10-CM

## 2019-01-20 DIAGNOSIS — I071 Rheumatic tricuspid insufficiency: Secondary | ICD-10-CM | POA: Diagnosis present

## 2019-01-20 DIAGNOSIS — E875 Hyperkalemia: Secondary | ICD-10-CM | POA: Diagnosis not present

## 2019-01-20 DIAGNOSIS — G8929 Other chronic pain: Secondary | ICD-10-CM | POA: Diagnosis present

## 2019-01-20 DIAGNOSIS — Z23 Encounter for immunization: Secondary | ICD-10-CM

## 2019-01-20 HISTORY — DX: Sepsis, unspecified organism: A41.9

## 2019-01-20 HISTORY — DX: Severe sepsis without septic shock: R65.20

## 2019-01-20 LAB — POCT I-STAT 7, (LYTES, BLD GAS, ICA,H+H)
Acid-base deficit: 8 mmol/L — ABNORMAL HIGH (ref 0.0–2.0)
Acid-base deficit: 9 mmol/L — ABNORMAL HIGH (ref 0.0–2.0)
Bicarbonate: 18.9 mmol/L — ABNORMAL LOW (ref 20.0–28.0)
Bicarbonate: 17.4 mmol/L — ABNORMAL LOW (ref 20.0–28.0)
Calcium, Ion: 1.09 mmol/L — ABNORMAL LOW (ref 1.15–1.40)
Calcium, Ion: 1.1 mmol/L — ABNORMAL LOW (ref 1.15–1.40)
HCT: 20 % — ABNORMAL LOW (ref 36.0–46.0)
HCT: 21 % — ABNORMAL LOW (ref 36.0–46.0)
Hemoglobin: 6.8 g/dL — CL (ref 12.0–15.0)
Hemoglobin: 7.1 g/dL — ABNORMAL LOW (ref 12.0–15.0)
O2 Saturation: 100 %
O2 Saturation: 96 %
Potassium: 4.9 mmol/L (ref 3.5–5.1)
Potassium: 5.1 mmol/L (ref 3.5–5.1)
Sodium: 134 mmol/L — ABNORMAL LOW (ref 135–145)
Sodium: 135 mmol/L (ref 135–145)
TCO2: 20 mmol/L — ABNORMAL LOW (ref 22–32)
TCO2: 19 mmol/L — ABNORMAL LOW (ref 22–32)
pCO2 arterial: 43.4 mmHg (ref 32.0–48.0)
pCO2 arterial: 38 mmHg (ref 32.0–48.0)
pH, Arterial: 7.246 — ABNORMAL LOW (ref 7.350–7.450)
pH, Arterial: 7.27 — ABNORMAL LOW (ref 7.350–7.450)
pO2, Arterial: 248 mmHg — ABNORMAL HIGH (ref 83.0–108.0)
pO2, Arterial: 90 mmHg (ref 83.0–108.0)

## 2019-01-20 LAB — CBC
HCT: 25.3 % — ABNORMAL LOW (ref 36.0–46.0)
HCT: 22.1 % — ABNORMAL LOW (ref 36.0–46.0)
HCT: 25.3 % — ABNORMAL LOW (ref 36.0–46.0)
Hemoglobin: 8.2 g/dL — ABNORMAL LOW (ref 12.0–15.0)
Hemoglobin: 7 g/dL — ABNORMAL LOW (ref 12.0–15.0)
Hemoglobin: 7.9 g/dL — ABNORMAL LOW (ref 12.0–15.0)
MCH: 26.7 pg (ref 26.0–34.0)
MCH: 26.4 pg (ref 26.0–34.0)
MCH: 25.4 pg — ABNORMAL LOW (ref 26.0–34.0)
MCHC: 32.4 g/dL (ref 30.0–36.0)
MCHC: 31.7 g/dL (ref 30.0–36.0)
MCHC: 31.2 g/dL (ref 30.0–36.0)
MCV: 82.4 fL (ref 80.0–100.0)
MCV: 83.4 fL (ref 80.0–100.0)
MCV: 81.4 fL (ref 80.0–100.0)
Platelets: 21 10*3/uL — CL (ref 150–400)
Platelets: 20 10*3/uL — CL (ref 150–400)
Platelets: 15 10*3/uL — CL (ref 150–400)
RBC: 3.07 MIL/uL — ABNORMAL LOW (ref 3.87–5.11)
RBC: 2.65 MIL/uL — ABNORMAL LOW (ref 3.87–5.11)
RBC: 3.11 MIL/uL — ABNORMAL LOW (ref 3.87–5.11)
RDW: 15.4 % (ref 11.5–15.5)
RDW: 15.5 % (ref 11.5–15.5)
RDW: 15.7 % — ABNORMAL HIGH (ref 11.5–15.5)
WBC: 8.3 10*3/uL (ref 4.0–10.5)
WBC: 8.3 10*3/uL (ref 4.0–10.5)
WBC: 14.4 10*3/uL — ABNORMAL HIGH (ref 4.0–10.5)
nRBC: 0 % (ref 0.0–0.2)
nRBC: 0 % (ref 0.0–0.2)
nRBC: 0 % (ref 0.0–0.2)

## 2019-01-20 LAB — HEMOGLOBIN AND HEMATOCRIT, BLOOD
HCT: 23.1 % — ABNORMAL LOW (ref 36.0–46.0)
Hemoglobin: 7.1 g/dL — ABNORMAL LOW (ref 12.0–15.0)

## 2019-01-20 LAB — DIFFERENTIAL
Abs Immature Granulocytes: 0.67 10*3/uL — ABNORMAL HIGH (ref 0.00–0.07)
Basophils Absolute: 0 10*3/uL (ref 0.0–0.1)
Basophils Relative: 0 %
Eosinophils Absolute: 0.2 10*3/uL (ref 0.0–0.5)
Eosinophils Relative: 2 %
Immature Granulocytes: 8 %
Lymphocytes Relative: 5 %
Lymphs Abs: 0.4 10*3/uL — ABNORMAL LOW (ref 0.7–4.0)
Monocytes Absolute: 0.4 10*3/uL (ref 0.1–1.0)
Monocytes Relative: 5 %
Neutro Abs: 6.3 10*3/uL (ref 1.7–7.7)
Neutrophils Relative %: 80 %

## 2019-01-20 LAB — COMPREHENSIVE METABOLIC PANEL
ALT: 22 U/L (ref 0–44)
AST: 62 U/L — ABNORMAL HIGH (ref 15–41)
Albumin: 2 g/dL — ABNORMAL LOW (ref 3.5–5.0)
Alkaline Phosphatase: 167 U/L — ABNORMAL HIGH (ref 38–126)
Anion gap: 19 — ABNORMAL HIGH (ref 5–15)
BUN: 117 mg/dL — ABNORMAL HIGH (ref 6–20)
CO2: 16 mmol/L — ABNORMAL LOW (ref 22–32)
Calcium: 7.5 mg/dL — ABNORMAL LOW (ref 8.9–10.3)
Chloride: 95 mmol/L — ABNORMAL LOW (ref 98–111)
Creatinine, Ser: 7.12 mg/dL — ABNORMAL HIGH (ref 0.44–1.00)
GFR calc Af Amer: 8 mL/min — ABNORMAL LOW (ref >60)
GFR calc non Af Amer: 7 mL/min — ABNORMAL LOW (ref >60)
Glucose, Bld: 85 mg/dL (ref 70–99)
Potassium: 5 mmol/L (ref 3.5–5.1)
Sodium: 130 mmol/L — ABNORMAL LOW (ref 135–145)
Total Bilirubin: 2.2 mg/dL — ABNORMAL HIGH (ref 0.3–1.2)
Total Protein: 5.7 g/dL — ABNORMAL LOW (ref 6.5–8.1)

## 2019-01-20 LAB — LACTIC ACID, PLASMA
Lactic Acid, Venous: 2.1 mmol/L (ref 0.5–1.9)
Lactic Acid, Venous: 1.6 mmol/L (ref 0.5–1.9)

## 2019-01-20 LAB — BLOOD CULTURE ID PANEL (REFLEXED)

## 2019-01-20 LAB — RAPID URINE DRUG SCREEN, HOSP PERFORMED
Amphetamines: POSITIVE — AB
Barbiturates: NOT DETECTED
Benzodiazepines: NOT DETECTED
Cocaine: NOT DETECTED
Opiates: POSITIVE — AB
Tetrahydrocannabinol: NOT DETECTED

## 2019-01-20 LAB — DIC (DISSEMINATED INTRAVASCULAR COAGULATION)PANEL
D-Dimer, Quant: 6.39 ug/mL-FEU — ABNORMAL HIGH (ref 0.00–0.50)
Platelets: 19 10*3/uL — CL (ref 150–400)
Prothrombin Time: 20.2 seconds — ABNORMAL HIGH (ref 11.4–15.2)
Smear Review: NONE SEEN
aPTT: 61 seconds — ABNORMAL HIGH (ref 24–36)

## 2019-01-20 LAB — URINALYSIS, ROUTINE W REFLEX MICROSCOPIC
Bilirubin Urine: NEGATIVE
Glucose, UA: NEGATIVE mg/dL
Ketones, ur: NEGATIVE mg/dL
Nitrite: NEGATIVE
Protein, ur: 100 mg/dL — AB
Specific Gravity, Urine: 1.02 (ref 1.005–1.030)
pH: 5 (ref 5.0–8.0)

## 2019-01-20 LAB — BASIC METABOLIC PANEL
Anion gap: 15 (ref 5–15)
BUN: 132 mg/dL — ABNORMAL HIGH (ref 6–20)
CO2: 16 mmol/L — ABNORMAL LOW (ref 22–32)
Calcium: 7.2 mg/dL — ABNORMAL LOW (ref 8.9–10.3)
Chloride: 103 mmol/L (ref 98–111)
Creatinine, Ser: 6.91 mg/dL — ABNORMAL HIGH (ref 0.44–1.00)
GFR calc Af Amer: 8 mL/min — ABNORMAL LOW (ref >60)
GFR calc non Af Amer: 7 mL/min — ABNORMAL LOW (ref >60)
Glucose, Bld: 79 mg/dL (ref 70–99)
Potassium: 5.2 mmol/L — ABNORMAL HIGH (ref 3.5–5.1)
Sodium: 134 mmol/L — ABNORMAL LOW (ref 135–145)

## 2019-01-20 LAB — PREGNANCY, URINE: Preg Test, Ur: NEGATIVE

## 2019-01-20 LAB — BLOOD GAS, VENOUS
Acid-base deficit: 5.8 mmol/L — ABNORMAL HIGH (ref 0.0–2.0)
Bicarbonate: 19.5 mmol/L — ABNORMAL LOW (ref 20.0–28.0)
FIO2: 21
O2 Saturation: 60.7 %
Patient temperature: 37
pCO2, Ven: 40 mmHg — ABNORMAL LOW (ref 44.0–60.0)
pH, Ven: 7.306 (ref 7.250–7.430)
pO2, Ven: 37.1 mmHg (ref 32.0–45.0)

## 2019-01-20 LAB — LIPASE, BLOOD: Lipase: 30 U/L (ref 11–51)

## 2019-01-20 LAB — CBG MONITORING, ED: Glucose-Capillary: 73 mg/dL (ref 70–99)

## 2019-01-20 LAB — ECHOCARDIOGRAM COMPLETE
Height: 61 in
Weight: 1615.53 oz

## 2019-01-20 LAB — ETHANOL: Alcohol, Ethyl (B): <10 mg/dL (ref <10)

## 2019-01-20 LAB — DIC (DISSEMINATED INTRAVASCULAR COAGULATION) PANEL
Fibrinogen: 199 mg/dL — ABNORMAL LOW (ref 210–475)
INR: 1.7 — ABNORMAL HIGH (ref 0.8–1.2)

## 2019-01-20 LAB — SAVE SMEAR(SSMR), FOR PROVIDER SLIDE REVIEW

## 2019-01-20 LAB — TYPE AND SCREEN
ABO/RH(D): O NEG
Antibody Screen: NEGATIVE

## 2019-01-20 LAB — TROPONIN I: Troponin I: 0.04 ng/mL (ref <0.03)

## 2019-01-20 LAB — PROCALCITONIN: Procalcitonin: 72.62 ng/mL

## 2019-01-20 LAB — GLUCOSE, CAPILLARY: Glucose-Capillary: 74 mg/dL (ref 70–99)

## 2019-01-20 LAB — MRSA PCR SCREENING: MRSA by PCR: NEGATIVE

## 2019-01-20 LAB — AMMONIA: Ammonia: 22 umol/L (ref 9–35)

## 2019-01-20 LAB — PREPARE RBC (CROSSMATCH)

## 2019-01-20 MED ORDER — KETAMINE HCL 50 MG/5ML IJ SOSY
1.0000 mg/kg | PREFILLED_SYRINGE | Freq: Once | INTRAMUSCULAR | Status: AC
  Administered 2019-01-20: 54 mg via INTRAVENOUS

## 2019-01-20 MED ORDER — SODIUM CHLORIDE 0.9 % IV BOLUS
1000.0000 mL | Freq: Once | INTRAVENOUS | Status: AC
  Administered 2019-01-20: 1000 mL via INTRAVENOUS

## 2019-01-20 MED ORDER — FENTANYL BOLUS VIA INFUSION
50.0000 ug | INTRAVENOUS | Status: DC | PRN
  Administered 2019-01-20 – 2019-01-24 (×10): 50 ug via INTRAVENOUS
  Filled 2019-01-20: qty 50

## 2019-01-20 MED ORDER — SODIUM CHLORIDE 0.9 % IV BOLUS
500.0000 mL | Freq: Once | INTRAVENOUS | Status: AC
  Administered 2019-01-20: 18:00:00 500 mL via INTRAVENOUS

## 2019-01-20 MED ORDER — FENTANYL 2500MCG IN NS 250ML (10MCG/ML) PREMIX INFUSION
0.0000 ug/h | INTRAVENOUS | Status: DC
  Administered 2019-01-20 – 2019-01-21 (×2): 25 ug/h via INTRAVENOUS
  Filled 2019-01-20 (×3): qty 250

## 2019-01-20 MED ORDER — PHENYLEPHRINE 40 MCG/ML (10ML) SYRINGE FOR IV PUSH (FOR BLOOD PRESSURE SUPPORT)
80.0000 ug | PREFILLED_SYRINGE | Freq: Once | INTRAVENOUS | Status: DC
  Filled 2019-01-20: qty 10

## 2019-01-20 MED ORDER — "THROMBI-PAD 3""X3"" EX PADS"
1.0000 | MEDICATED_PAD | Freq: Once | CUTANEOUS | Status: AC
  Administered 2019-01-21: 01:00:00 1 via TOPICAL
  Filled 2019-01-20: qty 1

## 2019-01-20 MED ORDER — NOREPINEPHRINE 4 MG/250ML-% IV SOLN
0.0000 ug/min | INTRAVENOUS | Status: DC
  Administered 2019-01-20: 11:00:00 3 ug/min via INTRAVENOUS
  Administered 2019-01-20: 16 ug/min via INTRAVENOUS
  Filled 2019-01-20 (×2): qty 250

## 2019-01-20 MED ORDER — CHLORHEXIDINE GLUCONATE 0.12% ORAL RINSE (MEDLINE KIT)
15.0000 mL | Freq: Two times a day (BID) | OROMUCOSAL | Status: DC
  Administered 2019-01-20 – 2019-02-25 (×68): 15 mL via OROMUCOSAL

## 2019-01-20 MED ORDER — CEFTAROLINE FOSAMIL 400 MG IV SOLR: 300.0000 mg | Freq: Once | INTRAVENOUS | Status: DC

## 2019-01-20 MED ORDER — ACETAMINOPHEN 650 MG RE SUPP: 650.0000 mg | Freq: Four times a day (QID) | RECTAL | Status: DC | PRN

## 2019-01-20 MED ORDER — CHLORHEXIDINE GLUCONATE CLOTH 2 % EX PADS
6.0000 | MEDICATED_PAD | Freq: Every day | CUTANEOUS | Status: AC
  Administered 2019-01-21 – 2019-01-25 (×5): 6 via TOPICAL

## 2019-01-20 MED ORDER — ONDANSETRON HCL 4 MG PO TABS: 4.0000 mg | ORAL_TABLET | Freq: Four times a day (QID) | ORAL | Status: DC | PRN

## 2019-01-20 MED ORDER — ORAL CARE MOUTH RINSE
15.0000 mL | OROMUCOSAL | Status: DC
  Administered 2019-01-20 – 2019-02-25 (×305): 15 mL via OROMUCOSAL

## 2019-01-20 MED ORDER — NOREPINEPHRINE BITARTRATE 1 MG/ML IV SOLN
0.0000 ug/min | INTRAVENOUS | Status: DC
  Filled 2019-01-20: qty 4

## 2019-01-20 MED ORDER — ACETAMINOPHEN 325 MG PO TABS
650.0000 mg | ORAL_TABLET | Freq: Four times a day (QID) | ORAL | Status: DC | PRN
  Administered 2019-01-21 – 2019-02-02 (×6): 650 mg via ORAL
  Filled 2019-01-20 (×7): qty 2

## 2019-01-20 MED ORDER — PIPERACILLIN-TAZOBACTAM IN DEX 2-0.25 GM/50ML IV SOLN
2.2500 g | Freq: Three times a day (TID) | INTRAVENOUS | Status: DC
  Administered 2019-01-20: 2.25 g via INTRAVENOUS
  Filled 2019-01-20 (×2): qty 50

## 2019-01-20 MED ORDER — SODIUM CHLORIDE 0.9 % IV SOLN
250.0000 mL | INTRAVENOUS | Status: DC
  Administered 2019-01-20: 250 mL via INTRAVENOUS

## 2019-01-20 MED ORDER — KETAMINE HCL 10 MG/ML IJ SOLN
INTRAMUSCULAR | Status: AC
  Filled 2019-01-20: qty 1

## 2019-01-20 MED ORDER — NOREPINEPHRINE BITARTRATE 1 MG/ML IV SOLN
2.0000 ug/min | INTRAVENOUS | Status: DC
  Filled 2019-01-20: qty 4

## 2019-01-20 MED ORDER — PIPERACILLIN-TAZOBACTAM 3.375 G IVPB 30 MIN
2.2500 g | Freq: Three times a day (TID) | INTRAVENOUS | Status: DC
  Filled 2019-01-20: qty 50

## 2019-01-20 MED ORDER — CEFAZOLIN SODIUM-DEXTROSE 1-4 GM/50ML-% IV SOLN
1.0000 g | INTRAVENOUS | Status: DC
  Administered 2019-01-20 – 2019-01-28 (×9): 1 g via INTRAVENOUS
  Filled 2019-01-20 (×9): qty 50

## 2019-01-20 MED ORDER — SODIUM CHLORIDE 0.9% IV SOLUTION
Freq: Once | INTRAVENOUS | Status: AC
  Administered 2019-01-20: 15:00:00 via INTRAVENOUS

## 2019-01-20 MED ORDER — SODIUM CHLORIDE 0.9 % IV SOLN
300.0000 mg | Freq: Once | INTRAVENOUS | Status: AC
  Administered 2019-01-20: 300 mg via INTRAVENOUS
  Filled 2019-01-20: qty 300

## 2019-01-20 MED ORDER — MUPIROCIN 2 % EX OINT
1.0000 "application " | TOPICAL_OINTMENT | Freq: Two times a day (BID) | CUTANEOUS | Status: DC
  Filled 2019-01-20: qty 22

## 2019-01-20 MED ORDER — ONDANSETRON HCL 4 MG/2ML IJ SOLN
4.0000 mg | Freq: Four times a day (QID) | INTRAMUSCULAR | Status: DC | PRN
  Administered 2019-02-01 – 2019-03-04 (×14): 4 mg via INTRAVENOUS
  Filled 2019-01-20 (×14): qty 2

## 2019-01-20 MED ORDER — VANCOMYCIN HCL IN DEXTROSE 1-5 GM/200ML-% IV SOLN
1000.0000 mg | Freq: Once | INTRAVENOUS | Status: DC
  Administered 2019-01-20: 1000 mg via INTRAVENOUS
  Filled 2019-01-20: qty 200

## 2019-01-20 MED ORDER — SODIUM CHLORIDE 0.9 % IV BOLUS
500.0000 mL | Freq: Once | INTRAVENOUS | Status: AC
  Administered 2019-01-20: 500 mL via INTRAVENOUS

## 2019-01-20 MED ORDER — KETAMINE HCL 10 MG/ML IJ SOLN
INTRAMUSCULAR | Status: AC
  Administered 2019-01-20: 10:00:00
  Filled 2019-01-20: qty 1

## 2019-01-20 MED ORDER — NOREPINEPHRINE 16 MG/250ML-% IV SOLN
0.0000 ug/min | INTRAVENOUS | Status: DC
  Administered 2019-01-20: 16 ug/min via INTRAVENOUS
  Administered 2019-01-21: 17:00:00 15 ug/min via INTRAVENOUS
  Administered 2019-01-22: 22 ug/min via INTRAVENOUS
  Administered 2019-01-22: 24 ug/min via INTRAVENOUS
  Filled 2019-01-20 (×4): qty 250

## 2019-01-20 MED ORDER — SODIUM CHLORIDE 0.9 % IV SOLN
INTRAVENOUS | Status: DC
  Administered 2019-01-20 – 2019-01-21 (×3): via INTRAVENOUS

## 2019-01-20 MED ORDER — PHENYLEPHRINE 40 MCG/ML (10ML) SYRINGE FOR IV PUSH (FOR BLOOD PRESSURE SUPPORT)
PREFILLED_SYRINGE | INTRAVENOUS | Status: AC
  Filled 2019-01-20: qty 10

## 2019-01-20 MED ORDER — MIDAZOLAM HCL 2 MG/2ML IJ SOLN
2.0000 mg | INTRAMUSCULAR | Status: DC | PRN
  Administered 2019-01-20 – 2019-01-22 (×3): 2 mg via INTRAVENOUS
  Filled 2019-01-20 (×5): qty 2

## 2019-01-20 MED ORDER — PIPERACILLIN-TAZOBACTAM 3.375 G IVPB 30 MIN
3.3750 g | Freq: Once | INTRAVENOUS | Status: AC
  Administered 2019-01-20: 3.375 g via INTRAVENOUS
  Filled 2019-01-20: qty 50

## 2019-01-20 MED ORDER — SODIUM CHLORIDE 0.9% IV SOLUTION
Freq: Once | INTRAVENOUS | Status: AC
  Administered 2019-01-22: 1000 mL via INTRAVENOUS

## 2019-01-20 MED ORDER — SODIUM CHLORIDE 0.9 % IV BOLUS
30.0000 mL/kg | Freq: Once | INTRAVENOUS | Status: AC
  Administered 2019-01-20: 1632 mL via INTRAVENOUS

## 2019-01-20 NOTE — ED Provider Notes (Signed)
[facility] DEPT Provider Note: Georgena Spurling, MD, FACEP  CSN: [id number] MRN: [id number] ARRIVAL: 01/20/19 at [place]: [place]  Altered Mental Status  Level 5 caveat: Altered mental status HISTORY OF PRESENT ILLNESS  01/20/19 2:44 AM Molly [name] is a 33 y.o. female with a history of IV drug abuse.  She was brought in by her brother who found her with an altered mental status when he came back from out of town.  The patient's speech is mostly nonsensical.  She is screaming in apparent discomfort with even minor stimuli.  She is cachectic with edematous lower legs and petechiae of her ankles and feet.   Past Medical History:  Diagnosis Date  . 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 Abscess of skin    buttock - most recently 2009/10  . CKD (chronic kidney disease) stage 3, GFR 30-59 ml/min (HCC)   . Drug-seeking behavior   . Hypersplenism   . IV drug user   . MRSA (methicillin resistant Staphylococcus aureus) infection   . MRSA bacteremia   . Pancytopenia ([place])   . 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 Septic pulmonary embolism ([place])   . Tobacco abuse     Past Surgical History:  Procedure Laterality Date  . IR GENERIC HISTORICAL  07/19/2016   IR LUMBAR DISC ASPIRATION W/IMG GUIDE 07/19/2016 Arne Cleveland, MD [facility]-INTERV RAD  . RADIOLOGY WITH ANESTHESIA N/A 04/11/2018   Procedure: MRI OF LUMBAR AND THORACIC SPINE WITHOUT CONTRAST WITH ANESTHESIA;  Surgeon: Radiologist, Medication, MD;  Location: [facility];  Service: Radiology;  Laterality: N/A;  . TEE WITHOUT CARDIOVERSION N/A 07/04/2013   Procedure: TRANSESOPHAGEAL ECHOCARDIOGRAM (TEE);  Surgeon: Larey Dresser, MD;  Location: [facility] ENDOSCOPY;  Service: Cardiovascular;  Laterality: N/A;  . TONSILLECTOMY     33 years old    Family History  Problem Relation Age of Onset  . Alcoholism Father     Social History   Tobacco Use  . Smoking status: Current Every Day Smoker    Packs/day:  0.50    Years: 7.00    Pack years: 3.50    Types: Cigarettes  . Smokeless tobacco: Never Used  . Tobacco comment: slowing down  Substance Use Topics  . Alcohol use: No  . Drug use: Yes    Types: IV, Oxycodone    Comment: heroin    Prior to Admission medications   Medication Sig Start Date End Date Taking? Authorizing Provider  ibuprofen (ADVIL,MOTRIN) 200 MG tablet Take 200-400 mg by mouth every 6 (six) hours as needed for moderate pain.     [provider]  senna-docusate (SENOKOT-S) 8.6-50 MG tablet Take 1 tablet by mouth at bedtime as needed for mild constipation. Patient not taking: Reported on 07/12/2017 07/21/16   Florinda Marker, MD    Allergies Vancomycin   REVIEW OF SYSTEMS     PHYSICAL EXAMINATION  Initial Vital Signs Blood pressure (!) 86/51, pulse (!) 111, temperature (!) 95.6 F (35.3 C), temperature source Rectal, resp. rate (!) 25, height 5\' 1"  (1.549 m), weight 54.4 kg, SpO2 98 %.  Examination General: Cachectic female in no acute distress; appears older than age of record HENT: normocephalic; multiple sores on face; dry mucous membranes; cheilitis:    Eyes: pupils equal, round and reactive to light; extraocular muscles grossly intact Neck: supple Heart: regular rate and rhythm; no murmur Lungs: clear to auscultation bilaterally Abdomen: soft; nondistended; no masses or hepatosplenomegaly; bowel sounds  present Extremities: No deformity; normal range of motion; pulses weak; edema, petechiae and purpura of ankles and feet:      Neurologic: Lethargic but moaning, screams to even minor stimuli; speech mostly incomprehensible; noted to move all extremities Skin: Warm and dry   RESULTS  Summary of this visit's results, reviewed by myself:   EKG Interpretation  Date/Time:    Ventricular Rate:    PR Interval:    QRS Duration:   QT Interval:    QTC Calculation:   R Axis:     Text Interpretation:        Laboratory Studies: Results for  orders placed or performed during the hospital encounter of 01/20/19 (from the past 24 hour(s))  Lactic acid, plasma     Status: Abnormal   Collection Time: 01/20/19  1:57 AM  Result Value Ref Range   Lactic Acid, Venous 2.1 (HH) 0.5 - 1.9 mmol/L  CBC     Status: Abnormal   Collection Time: 01/20/19  1:57 AM  Result Value Ref Range   WBC 8.3 4.0 - 10.5 K/uL   RBC 3.07 (L) 3.87 - 5.11 MIL/uL   Hemoglobin 8.2 (L) 12.0 - 15.0 g/dL   HCT 25.3 (L) 36.0 - 46.0 %   MCV 82.4 80.0 - 100.0 fL   MCH 26.7 26.0 - 34.0 pg   MCHC 32.4 30.0 - 36.0 g/dL   RDW 15.4 11.5 - 15.5 %   Platelets 21 (LL) 150 - 400 K/uL   nRBC 0.0 0.0 - 0.2 %  Comprehensive metabolic panel     Status: Abnormal   Collection Time: 01/20/19  1:57 AM  Result Value Ref Range   Sodium 130 (L) 135 - 145 mmol/L   Potassium 5.0 3.5 - 5.1 mmol/L   Chloride 95 (L) 98 - 111 mmol/L   CO2 16 (L) 22 - 32 mmol/L   Glucose, Bld 85 70 - 99 mg/dL   BUN 117 (H) 6 - 20 mg/dL   Creatinine, Ser 7.12 (H) 0.44 - 1.00 mg/dL   Calcium 7.5 (L) 8.9 - 10.3 mg/dL   Total Protein 5.7 (L) 6.5 - 8.1 g/dL   Albumin 2.0 (L) 3.5 - 5.0 g/dL   AST 62 (H) 15 - 41 U/L   ALT 22 0 - 44 U/L   Alkaline Phosphatase 167 (H) 38 - 126 U/L   Total Bilirubin 2.2 (H) 0.3 - 1.2 mg/dL   GFR calc non Af Amer 7 (L) >60 mL/min   GFR calc Af Amer 8 (L) >60 mL/min   Anion gap 19 (H) 5 - 15  Ammonia     Status: None   Collection Time: 01/20/19  1:57 AM  Result Value Ref Range   Ammonia 22 9 - 35 umol/L  Lipase, blood     Status: None   Collection Time: 01/20/19  1:57 AM  Result Value Ref Range   Lipase 30 11 - 51 U/L  Ethanol     Status: None   Collection Time: 01/20/19  1:57 AM  Result Value Ref Range   Alcohol, Ethyl (B) <10 <10 mg/dL  Blood gas, venous (at [facility])     Status: Abnormal   Collection Time: 01/20/19  2:15 AM  Result Value Ref Range   FIO2 21.00    pH, Ven 7.306 7.250 - 7.430   pCO2, Ven 40.0 (L) 44.0 - 60.0 mmHg   pO2, Ven 37.1 32.0 - 45.0  mmHg   Bicarbonate 19.5 (L) 20.0 - 28.0 mmol/L  Acid-base deficit 5.8 (H) 0.0 - 2.0 mmol/L   O2 Saturation 60.7 %   Patient temperature 37.0    Collection site VENIPUNCTURE    Drawn by [place]    Sample type VEIN   Urinalysis, Routine w reflex microscopic     Status: Abnormal   Collection Time: 01/20/19  2:52 AM  Result Value Ref Range   Color, Urine AMBER (A) YELLOW   APPearance CLOUDY (A) CLEAR   Specific Gravity, Urine 1.020 1.005 - 1.030   pH 5.0 5.0 - 8.0   Glucose, UA NEGATIVE NEGATIVE mg/dL   Hgb urine dipstick LARGE (A) NEGATIVE   Bilirubin Urine NEGATIVE NEGATIVE   Ketones, ur NEGATIVE NEGATIVE mg/dL   Protein, ur 100 (A) NEGATIVE mg/dL   Nitrite NEGATIVE NEGATIVE   Leukocytes,Ua TRACE (A) NEGATIVE   RBC / HPF 21-50 0 - 5 RBC/hpf   WBC, UA 21-50 0 - 5 WBC/hpf   Bacteria, UA FEW (A) NONE SEEN   Squamous Epithelial / LPF 0-5 0 - 5   Amorphous Crystal PRESENT   Rapid urine drug screen (hospital performed)     Status: Abnormal   Collection Time: 01/20/19  2:52 AM  Result Value Ref Range   Opiates POSITIVE (A) NONE DETECTED   Cocaine NONE DETECTED NONE DETECTED   Benzodiazepines NONE DETECTED NONE DETECTED   Amphetamines POSITIVE (A) NONE DETECTED   Tetrahydrocannabinol NONE DETECTED NONE DETECTED   Barbiturates NONE DETECTED NONE DETECTED  Pregnancy, urine     Status: None   Collection Time: 01/20/19  2:52 AM  Result Value Ref Range   Preg Test, Ur NEGATIVE NEGATIVE  Lactic acid, plasma     Status: None   Collection Time: 01/20/19  3:53 AM  Result Value Ref Range   Lactic Acid, Venous 1.6 0.5 - 1.9 mmol/L  Differential     Status: Abnormal   Collection Time: 01/20/19  3:53 AM  Result Value Ref Range   Neutrophils Relative % 80 %   Neutro Abs 6.3 1.7 - 7.7 K/uL   Lymphocytes Relative 5 %   Lymphs Abs 0.4 (L) 0.7 - 4.0 K/uL   Monocytes Relative 5 %   Monocytes Absolute 0.4 0.1 - 1.0 K/uL   Eosinophils Relative 2 %   Eosinophils Absolute 0.2 0.0 -  0.5 K/uL   Basophils Relative 0 %   Basophils Absolute 0.0 0.0 - 0.1 K/uL   WBC Morphology DOHLE BODIES    Immature Granulocytes 8 %   Abs Immature Granulocytes 0.67 (H) 0.00 - 0.07 K/uL   Burr Cells PRESENT    Target Cells PRESENT   CBC     Status: Abnormal   Collection Time: 01/20/19  3:53 AM  Result Value Ref Range   WBC 8.3 4.0 - 10.5 K/uL   RBC 2.65 (L) 3.87 - 5.11 MIL/uL   Hemoglobin 7.0 (L) 12.0 - 15.0 g/dL   HCT 22.1 (L) 36.0 - 46.0 %   MCV 83.4 80.0 - 100.0 fL   MCH 26.4 26.0 - 34.0 pg   MCHC 31.7 30.0 - 36.0 g/dL   RDW 15.5 11.5 - 15.5 %   Platelets 20 (LL) 150 - 400 K/uL   nRBC 0.0 0.0 - 0.2 %  DIC (disseminated intravasc coag) panel     Status: Abnormal (Preliminary result)   Collection Time: 01/20/19  4:33 AM  Result Value Ref Range   Prothrombin Time 20.2 (H) 11.4 - 15.2 seconds   INR 1.7 (H) 0.8 - 1.2  aPTT 61 (H) 24 - 36 seconds   Fibrinogen 199 (L) 210 - 475 mg/dL   D-Dimer, Quant PENDING 0.00 - 0.50 ug/mL-FEU   Platelets 19 (LL) 150 - 400 K/uL   Smear Review NO SCHISTOCYTES SEEN   Save Smear     Status: None   Collection Time: 01/20/19  4:33 AM  Result Value Ref Range   Smear Review SMEAR STAINED AND AVAILABLE FOR REVIEW    Imaging Studies: Dg Chest 2 View  Result Date: 01/20/2019 CLINICAL DATA:  Slurred speech, altered mental changes, and edema in legs/feet. EXAM: CHEST - 2 VIEW COMPARISON:  Chest x-rays from April 11, 2018 and April 22, 2018. CT scan from April 08, 2018. FINDINGS: A nodular opacity seen in the periphery of the left lung. Increased opacity is seen throughout the right lung and in the left perihilar region. The cardiomediastinal silhouette is stable. No pneumothorax. No other changes. IMPRESSION: 1. Increasing bilateral pulmonary opacities suggesting an infectious cause. A somewhat nodular opacity in the periphery of the left lung raises the possibility of septic emboli given the patient's history. A CT scan could better evaluate. Electronically  Signed   By: Dorise Bullion III M.D   On: 01/20/2019 03:15   Ct Head Wo Contrast  Result Date: 01/20/2019 CLINICAL DATA:  Altered mental status. EXAM: CT HEAD WITHOUT CONTRAST TECHNIQUE: Contiguous axial images were obtained from the base of the skull through the vertex without intravenous contrast. COMPARISON:  December 17, 2015 FINDINGS: Brain: The study is somewhat limited due to patient combativeness/motion. Within this limitation, no subdural, epidural, or subarachnoid hemorrhage. Cerebellum, brainstem, and basal cisterns are normal. Ventricles and sulci are unremarkable. No mass effect or midline shift. No acute cortical ischemia or infarct. Vascular: No hyperdense vessel or unexpected calcification. Skull: Normal. Negative for fracture or focal lesion. Sinuses/Orbits: No acute finding. Other: None. IMPRESSION: No acute intracranial abnormalities noted. Electronically Signed   By: Dorise Bullion III M.D   On: 01/20/2019 03:24   Ct Chest Wo Contrast  Result Date: 01/20/2019 CLINICAL DATA:  Abnormal chest x-ray. History of intravenous drug abuse. History of septic emboli. EXAM: CT CHEST WITHOUT CONTRAST TECHNIQUE: Multidetector CT imaging of the chest was performed following the standard protocol without IV contrast. COMPARISON:  CT scan April 08, 2018. FINDINGS: Cardiovascular: The main pulmonary artery is enlarged measuring 3.6 cm. The thoracic aorta and heart are unremarkable. Mediastinum/Nodes: Thyroid and esophagus are normal. No effusions. No adenopathy. Lungs/Pleura: Central airways are normal. No pneumothorax. Bilateral pulmonary nodules, many of which are peripheral. There are multiple cavitary nodules. A representative nodule in the right apex measures 1.9 cm on series 5, image 43. A representative cavitary nodule in the periphery of the left lung measures 2.7 cm on series 5, image 72. Interlobular septal thickening is noted. There is focal infiltrate in the right base. Scattered infiltrate  throughout the right lung greater than the left. Upper Abdomen: No acute abnormalities identified. Musculoskeletal: No chest wall mass or suspicious bone lesions identified. IMPRESSION: 1. Bilateral pulmonary nodules, many of which are peripheral and cavitated. Given history and appearance, the findings are most consistent with septic emboli. 2. More confluent opacity in the right base may represent a pneumonia, aspiration, or atelectasis. 3. More diffuse opacity on the right may represent a superimposed infectious process. 4. The main pulmonary artery measures 3.6 cm raising the possibility of pulmonary arterial hypertension. Electronically Signed   By: Dorise Bullion III M.D   On: 01/20/2019 04:22  ED COURSE and MDM  Nursing notes and initial vitals signs, including pulse oximetry, reviewed.  Vitals:   01/20/19 0445 01/20/19 0448 01/20/19 0500 01/20/19 0515  BP: 90/66  96/61 98/61  Pulse: (!) 113 (!) 110 (!) 120 (!) 114  Resp:  (!) 23 (!) 26 (!) 22  Temp:   97.6 F (36.4 C)   TempSrc:   Rectal   SpO2: 93% 97% 97% 94%  Weight:      Height:       2:53 AM IV fluid bolus initiated for hypotension.  Blood cultures and other laboratory studies obtained.  5:18 AM Zosyn and Teflaro per pharmacy consult ordered for likely sepsis.  Patient has a history of MRSA due to IV drug abuse. Discussed with Dr. Alcario Drought of hospitalist service.   5:42 AM DIC panel concerning for disseminated intravascular coagulation.  No schistocytes seen to suggest TTP.   PROCEDURES   CRITICAL CARE Performed by: Karen Chafe Monique Gift Total critical care time: 60 minutes Critical care time was exclusive of separately billable procedures and treating other patients. Critical care was necessary to treat or prevent imminent or life-threatening deterioration. Critical care was time spent personally by me on the following activities: development of treatment plan with patient and/or surrogate as well as nursing, discussions  with consultants, evaluation of patient's response to treatment, examination of patient, obtaining history from patient or surrogate, ordering and performing treatments and interventions, ordering and review of laboratory studies, ordering and review of radiographic studies, pulse oximetry and re-evaluation of patient's condition.   ED DIAGNOSES     ICD-10-CM   1. Delirium R41.0   2. Acute kidney injury superimposed on chronic kidney disease (HCC) N17.9    N18.9   3. Dehydration E86.0   4. Thrombocytopenia ([place]) D69.6   5. Chronic anemia D64.9   6. Acute cystitis with hematuria N30.01   7. Multifocal pneumonia J18.9   8. Sepsis associated hypotension (HCC) A41.9    I95.9   9. Polysubstance abuse ([place]) F19.10   10. DIC (disseminated intravascular coagulation) ([place]) D65        Layce Sprung, MD 01/20/19 [phone]

## 2019-01-20 NOTE — Progress Notes (Signed)
Initial Nutrition Assessment  DOCUMENTATION CODES:   Not applicable  INTERVENTION:   If tube feeds initiated, recommend:  Vital 1.2 @ goal rate of 79m/hr  Free water flushes 38mq4 hours to maintain tube patency   Regimen provides 1296kcal/day, 81g/day protein, 105673may free water  Recommend liquid MVI daily via tube  Recommend vitamin C 500m27mD via tube   Pt at high refeed risk; recommend monitor K, Mg and P labs daily   NUTRITION DIAGNOSIS:   Inadequate oral intake related to inability to eat(pt sedated and ventilated ) as evidenced by NPO status.  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    RD working remotely.  Pt sedated and ventilated. No OGT in place. Tube feed recommendations above. Recommend vitamin C supplementation in setting of petechiae. Pt is with DIC. Per chart, pt appears fairly weight stable pta. Suspect pt with severe malnutrition r/t chronic substance abuse but unable to diagnose at this time. Pt likely at high refeed risk; recommend monitor K, Mg and P labs daily.    Medications reviewed and include: NaCl '@125ml' /hr, fentanyl, levophed, zosyn  Labs reviewed: K 134(L), K 4.9 wnl, BUN 117(H), creat 7.12(H), alk phos 167(H), alb 2.0(L), AST 62(H), tbili 2.2(H) Hgb 6.8(L), Hct 20.0(L), Plt 19(L)  Patient is currently intubated on ventilator support MV: 9.2 L/min Temp (24hrs), Avg:97.5 F (36.4 C), Min:95.6 F (35.3 C), Max:98.2 F (36.8 C)  Propofol: none  MAP- 56-65mm34mUOP- 350ml 53mable to complete Nutrition-Focused physical exam at this time.   Diet Order:    Diet Order            Diet NPO time specified  Diet effective now             EDUCATION NEEDS:   Not appropriate for education at this time  Skin:  Skin Assessment: Reviewed RN Assessment(petechiae)  Last BM:  pta  Height:   Ht Readings from Last 1 Encounters:  01/20/19 '5\' 1"'  (1.549 m)    Weight:   Wt Readings from Last 1 Encounters:  01/20/19 45.8 kg    Ideal Body Weight:  47.7 kg  BMI:  Body mass index is 19.08 kg/m.  Estimated Nutritional Needs:   Kcal:  1287kcal/day   Protein:  70-80g/day   Fluid:  1.4L/day   Nathaniel Wakeley Koleen DistanceD, LDN Pager #- [phone]#- [phone] Hours Pager: [phone]

## 2019-01-20 NOTE — Consult Note (Signed)
Renal Service Consult Note [facility]  Ja Pistole 01/20/2019 [place] Requesting Physician: Dr Lynetta Mare  Reason for Consult:  Renal failure HPI: The patient is a 33 y.o. year-old came to ED w/ AMS, lower ext edema, lethargy, slurred speech.  BP's 80-90's, HR 120 in ED, got fluid boluses.  Creat 7, BUN 117, plts 19, Hb 7, INR 1.7/ ^PTT.  CT chest showed lesions c/w septic emboli. CT head neg. Asked to see for renal failure.   Blood cx's are + for GPC. Multiple prior admits for staph/ MRSA sepsis and complications (discitis/ endocarditis/ splenic abscesses) in the past.   Date  Creat  AKI/ eGFR  2014   0.7- 0.8  2017  1.51 >> 0.8  AKI #1, 46 - > 60  June 2019 3.81 >> 1.54 AKI #2, 15- 44  July 6  2.43  26  April 23 2018 2.53  24  Today 01/20/19 7.12  7    EChart  sept 2014 admitted for MRSA sepsis/ TV endocarditis, h/o IVDU, R arm cellulitis, anemia, left AMA  feb 2017 admitted jan at [facility] for MRSA sepsis w/ septic pulm emboli/lung abscesses, enlarged spleen w infarction, AoV vegetation. Left AMA from there, admitted [facility] and blood cx's neg at 3 days. TTE neg for veg. Plan was dc on 6 wks IV abx, but Pt then left AMA. AKI creat 1.22 July 2016 pt w/ hx MRSA endocarditis, IVDU and osteo of thoracolumbar spine admitted for infection, CT showed L1-2 discitis/ osteo and paravertebral abscess.  Disc asp was +for MRSA. Got IV dapto. ID felt best to treat w/ Dalbavancin and pt was dc'd to get this at the [facility] unit.    June 2019 pt admitted , stopped taking her longterm doxy proph for MRSA infection in aug 2018, saw ID in 11/2017. Then relapsed to using IVD's again. Had MRSA bacteremia and TV vegetation, rx'd w/ IV dapto and doxy per ID recs. AKI w/ peak creat 3.1.  ID rec'd 8 wks IV abx at least 2 weeks in hospital.   ROS  n/a for questioning   Past Medical History  Past Medical History:  Diagnosis Date  . 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 Abscess of skin     buttock - most recently 2009/10  . CKD (chronic kidney disease) stage 3, GFR 30-59 ml/min (HCC)   . Drug-seeking behavior   . Hypersplenism   . IV drug user   . MRSA (methicillin resistant Staphylococcus aureus) infection   . MRSA bacteremia   . Pancytopenia ([place])   . 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 Septic pulmonary embolism ([place])   . Tobacco abuse    Past Surgical History  Past Surgical History:  Procedure Laterality Date  . IR GENERIC HISTORICAL  07/19/2016   IR LUMBAR DISC ASPIRATION W/IMG GUIDE 07/19/2016 Arne Cleveland, MD [facility]INTER[facility]  . RADIOLOGY WITH ANESTHESIA N/A 04/11/2018   Procedure: MRI OF LUMBAR AND THORACIC SPINE WITHOUT CONTRAST WITH ANESTHESIA;  Surgeon: Radiologist, Medication, MD;  Location: [facility];  Service: Radiology;  Laterality: N/A;  . TEE WITHOUT CARDIOVERSION N/A 07/04/2013   Procedure: TRANSESOPHAGEAL ECHOCARDIOGRAM (TEE);  Surgeon: Larey Dresser, MD;  Location: [facility] ENDOSCOPY;  Service: Cardiovascular;  Laterality: N/A;  . TONSILLECTOMY     32 years old   Family History  Family History  Problem Relation Age of Onset  . Alcoholism Father    Social History  reports that she has been  smoking cigarettes. She has a 3.50 pack-year smoking history. She has never used smokeless tobacco. She reports current drug use. Drugs: IV and Oxycodone. She reports that she does not drink alcohol. Allergies  Allergies  Allergen Reactions  . Vancomycin Other (See Comments)    Possible contributor to AKI and thrombocytopenia   Home medications Prior to Admission medications   Medication Sig Start Date End Date Taking? Authorizing Provider  ibuprofen (ADVIL,MOTRIN) 200 MG tablet Take 200-400 mg by mouth every 6 (six) hours as needed for moderate pain.     [provider]  senna-docusate (SENOKOT-S) 8.6-50 MG tablet Take 1 tablet by mouth at bedtime as needed for mild constipation. Patient not taking: Reported on 07/12/2017 07/21/16   Florinda Marker,  MD   Liver Function Tests Recent Labs  Lab 01/20/19 0157  AST 62*  ALT 22  ALKPHOS 167*  BILITOT 2.2*  PROT 5.7*  ALBUMIN 2.0*   Recent Labs  Lab 01/20/19 0157  LIPASE 30   CBC Recent Labs  Lab 01/20/19 0157 01/20/19 0353 01/20/19 0433  WBC 8.3 8.3  --   NEUTROABS  --  6.3  --   HGB 8.2* 7.0*  --   HCT 25.3* 22.1*  --   MCV 82.4 83.4  --   PLT 21* 20* 19*   Basic Metabolic Panel Recent Labs  Lab 01/20/19 0157  NA 130*  K 5.0  CL 95*  CO2 16*  GLUCOSE 85  BUN 117*  CREATININE 7.12*  CALCIUM 7.5*   Iron/TIBC/Ferritin/ %Sat    Component Value Date/Time   IRON 37 04/23/2018 0859   TIBC 211 (L) 04/23/2018 0859   FERRITIN 170 04/23/2018 0859   IRONPCTSAT 18 04/23/2018 0859    Vitals:   01/20/19 0920 01/20/19 0922 01/20/19 0925 01/20/19 0927  BP: (!) 65/37 (!) 66/39 (!) 78/52 (!) 79/44  Pulse: (!) 114 (!) 114 (!) 117 (!) 114  Resp: _0 Temp: (!) 97.5 F (36.4 C) (!) 97.5 F (36.4 C) (!) 97.5 F (36.4 C) 97.7 F (36.5 C)  TempSrc:      SpO2: 100% 100% 100% 100%  Weight:      Height:       Exam Gen on vent No rash, cyanosis or gangrene Sclera anicteric, throat w ETT  No jvd or bruits Chest scattered rhonchi bilat RRR no MRG Abd soft ntnd no mass or ascites +bs GU defer MS no joint effusions or deformity Ext 1+ LE / UE edema, no wounds or ulcers Neuro is sedated on the vent    Home meds:  - ibuprofen prn/ senokot prn   UA 4/4 cloudy, 20-50 rbc/ wbc, 0-5 epi, few bact, prot 100  UA 04/22/2018  Cloudy, 20-50 rbc, 11-20 wbc, rare bact, prot 100  UA 03/2018   Cloudy, 21-50 rbc/ >59 wbc, no epis, rare bact, prot 100  Renal US - pending  Na 130  K 5  CO2 16  AG 19  Alb 2.0   NH3 22  Tbili 2.2     Assessment: 1. Acute/ CKD III - baseline creat 1.5- 2.5 from 2019.  Hx prior AKI episodes as complications of bacteremia.  Agree not a candidate for dialysis. Recommend supportive care. Poor prognosis.  2. Septic shock 3. Acute liver  injury 4. MRSA bacteremia 5. Hx recurrent sepsis as complication of IVDU - multiple complications inculding discitis, splenic abscess, endocarditis in the past 6. IV drug abuse - ongoing    Plan:  1. Will sign off. Please call for any needs.       [facility] 01/20/2019, 9:37 AM

## 2019-01-20 NOTE — Progress Notes (Signed)
PROGRESS NOTE  Brief Narrative: Molly [name] is a 33yo F w/hx IVDU/polysubstance abuse and inadequately treated MRSA bacteremia/TV endocarditis/spinal osteomyelitis/septic pulmonary emboli (left [facility] July 2019) who presented to the ED with AMS found to be septic with hypothermia, hypotension, and hypoxia. Creatinine >7, PCT 72. Lactic acid 2.1 with AGMA and mild LFT elevations. CT head nonacute, CT chest with cavitated nodules bilaterally bilaterally, confluent right base and diffuse right-sided opacities felt to be consistent with septic emboli/infectious process. Vancomycin, zosyn, then teflaro were given. BP improved with IV fluids, though the patient remains agitated. Initially appeared to be oxygenating but now with worsening hypoxia. Plan is to intubate and transfer to [facility] ICU.  Subjective: Pt agitated, restless, growing more hypoxic, unable to provide history.   Objective: BP 120/74   Pulse (!) 132   Temp (!) 97.2 F (36.2 C)   Resp 20   Ht 5\' 1"  (1.549 m)   Wt 54.4 kg   SpO2 91%   BMI 22.67 kg/m   Gen: Ill-appearing, restless, does not contribute to history. Ext: Dependent petechiae and some confluent purpura on lower extremities.   Assessment & Plan: Patient admitted a couple hours ago by Dr. Alcario Drought, please see H&P for full details. Sepsis with suggestion of DIC, septic pulmonary emboli. Acute hypoxic respiratory failure, worsening. Acute metabolic encephalopathy, possible withdrawal. Spoke with Drs. Loanne Drilling and Agarwala of [facility]. Will accept patient to [facility] ICU. Spoke with Dr. Theora Gianotti, EDP, who will intubate due to progressive hypoxia. I've consulted nephrology, Dr. Jonnie Finner, as well. Will continue with teflaro for now.   Patrecia Pour, MD Pager [phone] 01/20/2019, 8:48 AM

## 2019-01-20 NOTE — Progress Notes (Addendum)
PHARMACY - PHYSICIAN COMMUNICATION CRITICAL VALUE ALERT - BLOOD CULTURE IDENTIFICATION (BCID)  Molly [name] is an 33 y.o. female who presented to [facility] on 01/20/2019 with a chief complaint of AMS.  Assessment:  History of IVDU with inadequately treated endocarditis in June 2019.  July 2019 blood cultures were negative.  Imaging this admission consistent with septic emboli.  BCID this admission shows MSSA instead of MRSA.  She has AKI with CrCL ~8 ml/min - not a candidate for dialysis per Renal.  Name of physician (or Provider) Contacted: Dr. Mariane Masters  Current antibiotics: vancomycin and Zosyn  Changes to prescribed antibiotics recommended:  Recommendations accepted by provider  De-escalate to Ancef 1gm IV Q24H  Results for orders placed or performed during the hospital encounter of 01/20/19  Blood Culture ID Panel (Reflexed) (Collected: 01/20/2019  2:02 AM)  Result Value Ref Range   Enterococcus species NOT DETECTED NOT DETECTED   Listeria monocytogenes NOT DETECTED NOT DETECTED   Staphylococcus species DETECTED (A) NOT DETECTED   Staphylococcus aureus (BCID) DETECTED (A) NOT DETECTED   Methicillin resistance NOT DETECTED NOT DETECTED   Streptococcus species NOT DETECTED NOT DETECTED   Streptococcus agalactiae NOT DETECTED NOT DETECTED   Streptococcus pneumoniae NOT DETECTED NOT DETECTED   Streptococcus pyogenes NOT DETECTED NOT DETECTED   Acinetobacter baumannii NOT DETECTED NOT DETECTED   Enterobacteriaceae species NOT DETECTED NOT DETECTED   Enterobacter cloacae complex NOT DETECTED NOT DETECTED   Escherichia coli NOT DETECTED NOT DETECTED   Klebsiella oxytoca NOT DETECTED NOT DETECTED   Klebsiella pneumoniae NOT DETECTED NOT DETECTED   Proteus species NOT DETECTED NOT DETECTED   Serratia marcescens NOT DETECTED NOT DETECTED   Haemophilus influenzae NOT DETECTED NOT DETECTED   Neisseria meningitidis NOT DETECTED NOT DETECTED   Pseudomonas aeruginosa NOT DETECTED NOT DETECTED    Candida albicans NOT DETECTED NOT DETECTED   Candida glabrata NOT DETECTED NOT DETECTED   Candida krusei NOT DETECTED NOT DETECTED   Candida parapsilosis NOT DETECTED NOT DETECTED   Candida tropicalis NOT DETECTED NOT DETECTED     Molly [name], PharmD, BCPS, [place] 01/20/2019, 6:27 PM

## 2019-01-20 NOTE — H&P (Signed)
History and Physical    Molly [name] HYQ:[id number] DOB: 1986/04/13 DOA: 01/20/2019  PCP: Patient, No Pcp Per  Patient coming from: Home  I have personally briefly reviewed patient's old medical records in [place]  Chief Complaint: AMS  HPI: Molly [name] is a 33 y.o. female with medical history significant of IVDU, polysubstance abuse.  Brother came back from being out of town, found patient altered and brought in to ED.  The patient's speech is mostly nonsensical.  She is screaming in apparent discomfort with even minor stimuli.  She is cachectic with edematous lower legs and petechiae of her ankles and feet.  Review of her chart reveals her history of IVDU, osteomyelitis of spine, MRSA bacteremia, TV endocarditis, and septic pulmonary emboli.  These were partially treated.  ID was planning on starting patient on Teflaro in July last year for this (allergic to vanc), then patient left AMA.   ED Course: Given zosyn, then put on Teflaro.  Hypotensive to 80s-90s.  Tachy to 120s.  Given 30 cc/kg then another 1L bolus.  Creat 7.x, BUN 117, Platelets of 19, appears to be in DIC with HGB 7, decreased fibrinogin, elevated D.Dimer, INR 1.7, elevated aptt.  No schistocytes seen though.  CT non-contrast chest showed lesions c/w septic emboli.  EKG shows a new S1QT3 that wasn't present after last admit in June with septic pulmonary emboli.  CT head neg.   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)    Septic pulmonary embolism ([place])    Tobacco abuse      Past Surgical History:  Procedure Laterality Date   IR GENERIC HISTORICAL  07/19/2016   IR LUMBAR DISC ASPIRATION W/IMG GUIDE 07/19/2016 Arne Cleveland, MD [facility]-INTERV RAD   RADIOLOGY WITH ANESTHESIA N/A 04/11/2018   Procedure: MRI OF LUMBAR AND THORACIC SPINE WITHOUT CONTRAST WITH ANESTHESIA;  Surgeon: Radiologist, Medication, MD;  Location: [place];  Service: Radiology;  Laterality: N/A;   TEE WITHOUT CARDIOVERSION N/A 07/04/2013   Procedure: TRANSESOPHAGEAL ECHOCARDIOGRAM (TEE);  Surgeon: Larey Dresser, MD;  Location: [facility] ENDOSCOPY;  Service: Cardiovascular;  Laterality: N/A;   TONSILLECTOMY     33 years old     reports that she has been smoking cigarettes. She has a 3.50 pack-year smoking history. She has never used smokeless tobacco. She reports current drug use. Drugs: IV and Oxycodone. She reports that she does not drink alcohol.  Allergies  Allergen Reactions   Vancomycin Other (See Comments)    Possible contributor to AKI and thrombocytopenia    Family History  Problem Relation Age of Onset   Alcoholism Father      Prior to Admission medications   Medication Sig Start Date End Date Taking? Authorizing Provider  ibuprofen (ADVIL,MOTRIN) 200 MG tablet Take 200-400 mg by mouth every 6 (six) hours as needed for moderate pain.     [provider]  senna-docusate (SENOKOT-S) 8.6-50 MG tablet Take 1 tablet by mouth at bedtime as needed for mild constipation. Patient not  taking: Reported on 07/12/2017 07/21/16   Florinda Marker, MD    Physical Exam: Vitals:   01/20/19 0500 01/20/19 0515 01/20/19 0530 01/20/19 0545  BP: 96/61 98/61 (!) 98/56 102/85  Pulse: (!) 120 (!) 114 (!) 111 (!) 120  Resp: (!) 26 (!) 22  (!) 29  Temp: 97.6 F (36.4 C)     TempSrc: Rectal     SpO2: 97% 94% 92% 97%  Weight:      Height:        Constitutional: Lethargic, wakes up and screams out "help me", then passes out again Eyes: PERRL, lids and conjunctivae normal ENMT: Mucous  membranes are moist. Posterior pharynx clear of any exudate or lesions.Normal dentition.  Neck: normal, supple, no masses, no thyromegaly Respiratory: clear to auscultation bilaterally, no wheezing, no crackles. Normal respiratory effort. No accessory muscle use.  Cardiovascular: Regular rate and rhythm, no murmurs / rubs / gallops. No extremity edema. 2+ pedal pulses. No carotid bruits.  Abdomen: no tenderness, no masses palpated. No hepatosplenomegaly. Bowel sounds positive.  Musculoskeletal: no clubbing / cyanosis. No joint deformity upper and lower extremities. Good ROM, no contractures. Normal muscle tone.  Skin:    Neurologic: MAE Psychiatric: Lethargic, wakes up and screams out "help me", then passes out again   Labs on Admission: I have personally reviewed following labs and imaging studies  CBC: Recent Labs  Lab 01/20/19 0157 01/20/19 0353 01/20/19 0433  WBC 8.3 8.3  --   NEUTROABS  --  6.3  --   HGB 8.2* 7.0*  --   HCT 25.3* 22.1*  --   MCV 82.4 83.4  --   PLT 21* 20* 19*   Basic Metabolic Panel: Recent Labs  Lab 01/20/19 0157  NA 130*  K 5.0  CL 95*  CO2 16*  GLUCOSE 85  BUN 117*  CREATININE 7.12*  CALCIUM 7.5*   GFR: Estimated Creatinine Clearance: 8.6 mL/min (A) (by C-G formula based on SCr of 7.12 mg/dL (H)). Liver Function Tests: Recent Labs  Lab 01/20/19 0157  AST 62*  ALT 22  ALKPHOS 167*  BILITOT 2.2*  PROT 5.7*  ALBUMIN 2.0*   Recent Labs  Lab 01/20/19 0157  LIPASE 30   Recent Labs  Lab 01/20/19 0157  AMMONIA 22   Coagulation Profile: Recent Labs  Lab 01/20/19 0433  INR 1.7*   Cardiac Enzymes: No results for input(s): CKTOTAL, CKMB, CKMBINDEX, TROPONINI in the last 168 hours. BNP (last 3 results) No results for input(s): PROBNP in the last 8760 hours. HbA1C: No results for input(s): HGBA1C in the last 72 hours. CBG: No results for input(s): GLUCAP in the last 168 hours. Lipid Profile: No results for input(s): CHOL,  HDL, LDLCALC, TRIG, CHOLHDL, LDLDIRECT in the last 72 hours. Thyroid Function Tests: No results for input(s): TSH, T4TOTAL, FREET4, T3FREE, THYROIDAB in the last 72 hours. Anemia Panel: No results for input(s): VITAMINB12, FOLATE, FERRITIN, TIBC, IRON, RETICCTPCT in the last 72 hours. Urine analysis:    Component Value Date/Time   COLORURINE AMBER (A) 01/20/2019 0252   APPEARANCEUR CLOUDY (A) 01/20/2019 0252   LABSPEC 1.020 01/20/2019 0252   PHURINE 5.0 01/20/2019 0252   GLUCOSEU NEGATIVE 01/20/2019 0252   HGBUR LARGE (A) 01/20/2019 0252   BILIRUBINUR NEGATIVE 01/20/2019 0252   KETONESUR NEGATIVE 01/20/2019 0252   PROTEINUR 100 (A) 01/20/2019 0252   NITRITE NEGATIVE 01/20/2019 0252   LEUKOCYTESUR TRACE (A) 01/20/2019 0252    Radiological Exams on Admission: Dg Chest 2 View  Result Date: 01/20/2019 CLINICAL DATA:  Slurred speech, altered mental changes, and edema in legs/feet. EXAM: CHEST - 2 VIEW COMPARISON:  Chest x-rays from April 11, 2018 and April 22, 2018. CT scan from April 08, 2018. FINDINGS: A nodular opacity seen in the periphery of the left lung. Increased opacity is seen throughout the right lung and in the left perihilar region. The cardiomediastinal silhouette is stable. No pneumothorax. No other changes. IMPRESSION: 1. Increasing bilateral pulmonary opacities suggesting an infectious cause. A somewhat nodular opacity in the periphery of the left lung raises the possibility of septic emboli given the patient's history. A CT scan could better evaluate. Electronically Signed   By: Dorise Bullion III M.D   On: 01/20/2019 03:15   Ct Head Wo Contrast  Result Date: 01/20/2019 CLINICAL DATA:  Altered mental status. EXAM: CT HEAD WITHOUT CONTRAST TECHNIQUE: Contiguous axial images were obtained from the base of the skull through the vertex without intravenous contrast. COMPARISON:  December 17, 2015 FINDINGS: Brain: The study is somewhat limited due to patient combativeness/motion. Within  this limitation, no subdural, epidural, or subarachnoid hemorrhage. Cerebellum, brainstem, and basal cisterns are normal. Ventricles and sulci are unremarkable. No mass effect or midline shift. No acute cortical ischemia or infarct. Vascular: No hyperdense vessel or unexpected calcification. Skull: Normal. Negative for fracture or focal lesion. Sinuses/Orbits: No acute finding. Other: None. IMPRESSION: No acute intracranial abnormalities noted. Electronically Signed   By: Dorise Bullion III M.D   On: 01/20/2019 03:24   Ct Chest Wo Contrast  Result Date: 01/20/2019 CLINICAL DATA:  Abnormal chest x-ray. History of intravenous drug abuse. History of septic emboli. EXAM: CT CHEST WITHOUT CONTRAST TECHNIQUE: Multidetector CT imaging of the chest was performed following the standard protocol without IV contrast. COMPARISON:  CT scan April 08, 2018. FINDINGS: Cardiovascular: The main pulmonary artery is enlarged measuring 3.6 cm. The thoracic aorta and heart are unremarkable. Mediastinum/Nodes: Thyroid and esophagus are normal. No effusions. No adenopathy. Lungs/Pleura: Central airways are normal. No pneumothorax. Bilateral pulmonary nodules, many of which are peripheral. There are multiple cavitary nodules. A representative nodule in the right apex measures 1.9 cm on series 5, image 43. A representative cavitary nodule in the periphery of the left lung measures 2.7 cm on series 5, image 72. Interlobular septal thickening is noted. There is focal infiltrate in the right base. Scattered infiltrate throughout the right lung greater than the left. Upper Abdomen: No acute abnormalities identified. Musculoskeletal: No chest wall mass or suspicious bone lesions identified. IMPRESSION: 1. Bilateral pulmonary nodules, many of which are peripheral and cavitated. Given history and appearance, the findings are most consistent with septic emboli. 2. More confluent opacity in the right base may represent a pneumonia, aspiration, or  atelectasis. 3. More diffuse opacity on the right may represent a superimposed infectious process. 4. The main pulmonary artery measures 3.6 cm raising the possibility of pulmonary arterial hypertension. Electronically Signed   By: Dorise Bullion III M.D   On: 01/20/2019 04:22    EKG: Independently reviewed.  Assessment/Plan Principal Problem:   Sepsis with multiple organ dysfunction (MOD) (HCC) Active Problems:   Intravenous drug abuse, continuous (HCC)   Polysubstance abuse ([place])   Acute renal failure (ARF) ([place])   Acute septic pulmonary embolism with acute cor pulmonale (June 2019)   Acute encephalopathy   Thrombocytopenia (HCC)   Sepsis with disseminated intravascular coagulopathy (DIC) ([place])    1. Sepsis with MODS and DIC - 1. IVF: 2.6L bolus then 125 cc/hr  NS 2. Repeat CBC/ CMP tomorrow AM 3. Teflaro for now given vanc allergy 4. Needs ID consult in AM 5. 2d echo ordered due to suspected septic PE with acute cor pulmonale and due to suspected endocarditis 6. MRI brain, C,T, and L spine ordered due to acute encephalopathy, as well as her history of osteomyelitis of spine 7. BCx pending 8. Spoke with Dr. Jimmy Footman to make PCCM aware of patient (who I think may decompensate / worsen throughout the day today).  She doesn't feel patient requires ICU at this time though. 2. AKF - 1. IVF as above 2. Strict intake and ouput 3. US renal 4. Consult nephrology in AM 3. Acute encephalopathy 1. MRI brain as above 2. Sepsis treatment and work up as above 4. Thrombocytopenia - 1. Appears to be more suspicious for DIC with abn fibrinogin levels and coagulation pathway 2. No schistocytes seen 3. Dont think its TTP therefore, though she does have the AMS and AKI expected with TTP 1. PCCM also wasn't too impressed for TTP at this time 5. Acute septic PEs - 1. Sepsis treatment as above 2. 2d echo as above 3. PCCM didn't think much Value of VQ scan. 6. Polysubstance abuse - 1. If  patient becomes hemodynamically stable, consider treating withdrawal symptoms 2. But at the moment, too hypotensive, altered, etc to consider withdrawal treatment.  DVT prophylaxis: SCDs Code Status: Full Family Communication: No family in room Disposition Plan: TBD Consults called: Spoke with PCCM informally, needs formal ID and nephrology consults in AM, suspect she will deteriorate and need PCCM later today Admission status: Admit to inpatient  Severity of Illness: The appropriate patient status for this patient is INPATIENT. Inpatient status is judged to be reasonable and necessary in order to provide the required intensity of service to ensure the patient's safety. The patient's presenting symptoms, physical exam findings, and initial radiographic and laboratory data in the context of their chronic comorbidities is felt to place them at high risk for further clinical deterioration. Furthermore, it is not anticipated that the patient will be medically stable for discharge from the hospital within 2 midnights of admission. The following factors support the patient status of inpatient.   Patient with sepsis with DIC and MODS, septic pulmonary emboli, AKF, probably bacteremia and endocarditis, acute encephalopathy.   * I certify that at the point of admission it is my clinical judgment that the patient will require inpatient hospital care spanning beyond 2 midnights from the point of admission due to high intensity of service, high risk for further deterioration and high frequency of surveillance required.*    Devonta Blanford M. DO [facility]  How to contact the [facility] Attending or Consulting provider [place] or covering provider during after hours [place], for this patient?  1. Check the care team in [facility] and look for a) attending/consulting [facility] provider listed and b) the [facility] team listed 2. Log into [email or web address]  Am[employer] Physician Scheduling and messaging for groups and whole hospitals   On call and physician scheduling software for group practices, residents, hospitalists and other medical providers for call, clinic, rotation and shift schedules. On[employer] is a hospital-wide system for scheduling doctors and paging doctors on call. EasyPlot is for scientific plotting and data analysis.  [email or web address]  and use [place]'s universal password to access. If you do not have the password, please contact the hospital operator.  3. Locate the [facility] provider you are looking for under [facility] and page to  a number that you can be directly reached. 4. If you still have difficulty reaching the provider, please page the [facility] (Director on Call) for the Hospitalists listed on amion for assistance.  01/20/2019, 6:07 AM

## 2019-01-20 NOTE — H&P (Signed)
NAME:  Molly [name], MRN:  [id number], DOB:  [birth year], LOS: 0 ADMISSION DATE:  01/20/2019, CONSULTATION DATE: 01/20/2019 REFERRING MD:  Bonner Puna, CHIEF COMPLAINT:  Sepsis  Brief History   3 of IV drug abuse with a previous history of inadequately treated tricuspid vegetation.  Blood cultures performed on 04/10/2018 showed  History of present illness   Patient is a 33 year old white female with a history of IV drug abuse/polysubstance abuse (presented with urine drug screen positive for opiates and amphetamines ) and a previous history in June or July 2019 of inadequately treated endocarditis having signed out AMA at that time.  She had a tricuspid vegetation at that time.  She re-presented hypotensive and in respiratory distress requiring intubation at [facility].  CT scan of the chest was performed there showing bilateral nodular but in particular cavitary lesions consistent with septic emboli from tricuspid source.  She was given a dose of vancomycin and Zosyn and Teflaro at [place] [facility] and transferred here.  On my evaluation the patient is sedated, on low-dose Levophed with a blood pressure of 100/80 intubated on PR CV with tidal volumes of 450 respiratory rate 18.  Initial blood gas shows a pH of 7.2 PCO2 of 45 adequate oxygenation.  Echocardiogram performed at [facility] is currently pending.  Previous blood cultures from June of last year were positive for MRSA.  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, history of sacral osteomyelitis, history of pancytopenia, history of MRSA bacteremia, history of chronic kidney disease stage III with a GFR estimated between 18 and 60, history of recurrent skin abscesses, history of abscess in epidural space  Significant Hospital Events   Intubation and transfer to [facility] for ICU therapy  Consults:  nephrology  Procedures:  Intubation   Significant  Diagnostic Tests:  Echocardiogram results pending CT scan of the chest MRI of the spine pending   Micro Data:  Blood cultures x2 results pending  Antimicrobials:  Vancomycin Zosyn Teflaro  Interim history/subjective:  Patient is sedated intubated on Levophed and fentanyl for sedation  Objective   Blood pressure (!) 81/48, pulse (!) 116, temperature 97.8 F (36.6 C), temperature source Oral, resp. rate (!) 21, height 5\' 1"  (1.549 m), weight 54.4 kg, SpO2 100 %.    Vent Mode: PRVC FiO2 (%):  [50 %-100 %] 50 % Set Rate:  [18 bmp-20 bmp] 20 bmp Vt Set:  [450 mL] 450 mL PEEP:  [5 cmH20] 5 cmH20 Plateau Pressure:  [15 cmH20-17 cmH20] 15 cmH20   Intake/Output Summary (Last 24 hours) at 01/20/2019 1239 Last data filed at 01/20/2019 1053 Gross per 24 hour  Intake 3604.18 ml  Output 350 ml  Net 3254.18 ml   Filed Weights   01/20/19 0130  Weight: 54.4 kg    Examination: General: White female appearing significantly older than stated age HENT: Intubated with multiple scabbed areas of her face Lungs: Clear Cardiovascular: Regular Abdomen: Bowel sounds hypoactive Extremities: Areas of scabbing on legs, 1+ edema Neuro: sedated Non focal GU: foley  Resolved Hospital Problem list   none  Assessment & Plan:  1. 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 Severe Sepsis  IVF started, on minimal pressors now  3. DIC Supportive care  4.  Severe AKI Will consult nephrology, doubt a dialysis candidate  5.  Hx IV drug abuse, Urine tox + amphetamines and opiates Continue fentanyl  6.  Hypotension 2nd  to 1 and 2 As above  7.  DIC 2nd to 1 and 2 Supportive care  8.  Acute hepatic injury Monitor intermittenly  9.  Septic pulmonary emboli 2nd to 1 As above  Patients prognosis is grim will attempt to discuss with POA.    Best practice:  Diet: npo Pain/Anxiety/Delirium protocol (if indicated):Fentanyl VAP protocol (if indicated): probable mrsa septic  emboli DVT prophylaxis: scd GI prophylaxis: protocol Glucose control: protocal Mobility: sedated Code Status: full Family Communication: none yet Disposition:   Labs   CBC: Recent Labs  Lab 01/20/19 0157 01/20/19 0353 01/20/19 0433  WBC 8.3 8.3  --   NEUTROABS  --  6.3  --   HGB 8.2* 7.0*  --   HCT 25.3* 22.1*  --   MCV 82.4 83.4  --   PLT 21* 20* 19*    Basic Metabolic Panel: Recent Labs  Lab 01/20/19 0157  NA 130*  K 5.0  CL 95*  CO2 16*  GLUCOSE 85  BUN 117*  CREATININE 7.12*  CALCIUM 7.5*   GFR: Estimated Creatinine Clearance: 8.6 mL/min (A) (by C-G formula based on SCr of 7.12 mg/dL (H)). Recent Labs  Lab 01/20/19 0157 01/20/19 0353 01/20/19 0433  PROCALCITON  --   --  72.62  WBC 8.3 8.3  --   LATICACIDVEN 2.1* 1.6  --     Liver Function Tests: Recent Labs  Lab 01/20/19 0157  AST 62*  ALT 22  ALKPHOS 167*  BILITOT 2.2*  PROT 5.7*  ALBUMIN 2.0*   Recent Labs  Lab 01/20/19 0157  LIPASE 30   Recent Labs  Lab 01/20/19 0157  AMMONIA 22    ABG    Component Value Date/Time   PHART 7.387 04/07/2018 2344   PCO2ART 36.9 04/07/2018 2344   PO2ART 70.0 (L) 04/07/2018 2344   HCO3 19.5 (L) 01/20/2019 0215   TCO2 23 04/07/2018 2344   ACIDBASEDEF 5.8 (H) 01/20/2019 0215   O2SAT 60.7 01/20/2019 0215     Coagulation Profile: Recent Labs  Lab 01/20/19 0433  INR 1.7*    Cardiac Enzymes: Recent Labs  Lab 01/20/19 0535  TROPONINI 0.04*    HbA1C: No results found for: HGBA1C  CBG: Recent Labs  Lab 01/20/19 1044  GLUCAP 73    Review of Systems:   Patient sedated, most of history from chart, care givers  Past Medical History  She,  has a past medical history of Abscess in epidural space of T12/L1 spine, Abscess of skin, CKD (chronic kidney disease) stage 3, GFR 30-59 ml/min ([place]), Drug-seeking behavior, Hypersplenism, IV drug user, MRSA (methicillin resistant Staphylococcus aureus) infection, MRSA bacteremia, Pancytopenia  ([place]), 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), Septic pulmonary embolism ([place]), and Tobacco abuse.   Surgical History    Past Surgical History:  Procedure Laterality Date   IR GENERIC HISTORICAL  07/19/2016   IR LUMBAR DISC ASPIRATION W/IMG GUIDE 07/19/2016 Arne Cleveland, MD [facility]-INTERV RAD   RADIOLOGY WITH ANESTHESIA N/A 04/11/2018   Procedure: MRI OF LUMBAR AND THORACIC SPINE WITHOUT CONTRAST WITH ANESTHESIA;  Surgeon: Radiologist, Medication, MD;  Location: [place];  Service: Radiology;  Laterality: N/A;   TEE WITHOUT CARDIOVERSION N/A 07/04/2013   Procedure: TRANSESOPHAGEAL ECHOCARDIOGRAM (TEE);  Surgeon: Larey Dresser, MD;  Location[facility] ENDOSCOPY;  Service: Cardiovascular;  Laterality: N/A;   TONSILLECTOMY     33 years old     Social History   reports that she has been smoking cigarettes. She has a 3.50 pack-year smoking history. She  has never used smokeless tobacco. She reports current drug use. Drugs: IV and Oxycodone. She reports that she does not drink alcohol.   Family History   Her family history includes Alcoholism in her father.   Allergies Allergies  Allergen Reactions   Vancomycin Other (See Comments)    Possible contributor to AKI and thrombocytopenia     Home Medications  Prior to Admission medications   Medication Sig Start Date End Date Taking? Authorizing Provider  ibuprofen (ADVIL,MOTRIN) 200 MG tablet Take 200-400 mg by mouth every 6 (six) hours as needed for moderate pain.    Yes [provider]  senna-docusate (SENOKOT-S) 8.6-50 MG tablet Take 1 tablet by mouth at bedtime as needed for mild constipation. Patient not taking: Reported on 07/12/2017 07/21/16   Florinda Marker, MD     Critical care time: 45 minutes

## 2019-01-20 NOTE — ED Notes (Signed)
Pt restless and thrashing in bed therefore unable to obtain US of kidney's

## 2019-01-20 NOTE — ED Provider Notes (Signed)
[place] DEPT Provider Note   CSN: [id number] Arrival date & time: 01/20/19  0122    History   Chief Complaint Chief Complaint  Patient presents with   Altered Mental Status    HPI Molly [name] is a 33 y.o. female.     HPI  Past Medical History:  Diagnosis Date   Abscess in epidural space of T12/L1 spine    Abscess of skin    buttock - most recently 2009/10   CKD (chronic kidney disease) stage 3, GFR 30-59 ml/min (HCC)    Drug-seeking behavior    Hypersplenism    IV drug user    MRSA (methicillin resistant Staphylococcus aureus) infection    MRSA bacteremia    Pancytopenia ([place])    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)    Septic pulmonary embolism ([place])    Tobacco abuse     Patient Active Problem List   Diagnosis Date Noted   Acute encephalopathy 01/20/2019   Thrombocytopenia ([place]) 01/20/2019   Sepsis with disseminated intravascular coagulopathy (DIC) ([place]) 01/20/2019   Sepsis ([place]) 01/20/2019   Pancytopenia, acquired ([place]) 04/22/2018   Hypersplenism 04/22/2018   CKD (chronic kidney disease) stage 3, GFR 30-59 ml/min (HCC) 04/22/2018   IV drug user 04/22/2018   Bacteremia due to methicillin resistant Staphylococcus aureus 04/22/2018   Acute septic pulmonary embolism with acute cor pulmonale (June 2019) 04/22/2018    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 and T12/L1 04/22/2018   Tobacco abuse 04/22/2018   Noncompliance 04/22/2018   Abnormal urinalysis 04/22/2018   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   Addiction to drug ([place])    Acute renal failure (ARF) (HCC)    Epidural abscess    Acute bacterial endocarditis    Chronic thoracic back pain    Palliative care by specialist    Sepsis with multiple organ dysfunction (MOD) ([place]) 04/08/2018   Hyponatremia 04/08/2018   Eczema 07/12/2017   Acute upper respiratory infection 11/02/2016   Hepatitis C antibody test  positive 08/24/2016   Discitis of lumbosacral region 07/19/2016   Splenomegaly    Osteomyelitis of vertebra of thoracolumbar region [facility])    Polysubstance abuse ([place])    Goals of care, counseling/discussion    Cocaine use 11/28/2015   Pancytopenia ([place]) 11/27/2015   AKI (acute kidney injury) ([place]) 11/27/2015   Anasarca 11/27/2015   Cellulitis of multiple sites of right hand and fingers 07/07/2013   Anemia 07/04/2013   Pulmonary HTN ([place]) 07/04/2013   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 07/04/2013   Aortic valve endocarditis 07/04/2013   Septic pulmonary embolism ([place]) 07/03/2013   Intravenous drug abuse, continuous ([place]) 07/03/2013   MRSA bacteremia 07/03/2013    Past Surgical History:  Procedure Laterality Date   IR GENERIC HISTORICAL  07/19/2016   IR LUMBAR DISC ASPIRATION W/IMG GUIDE 07/19/2016 Arne Cleveland, MD [facility]INTER[facility] RAD   RADIOLOGY WITH ANESTHESIA N/A 04/11/2018   Procedure: MRI OF LUMBAR AND THORACIC SPINE WITHOUT CONTRAST WITH ANESTHESIA;  Surgeon: Radiologist, Medication, MD;  Location: [place];  Service: Radiology;  Laterality: N/A;   TEE WITHOUT CARDIOVERSION N/A 07/04/2013   Procedure: TRANSESOPHAGEAL ECHOCARDIOGRAM (TEE);  Surgeon: Larey Dresser, MD;  Location[facility] ENDOSCOPY;  Service: Cardiovascular;  Laterality: N/A;   TONSILLECTOMY     33 years old     OB History   No obstetric history on file.      Home Medications    Prior to Admission medications   Medication Sig Start Date End Date  Taking? Authorizing Provider  ibuprofen (ADVIL,MOTRIN) 200 MG tablet Take 200-400 mg by mouth every 6 (six) hours as needed for moderate pain.     [provider]  senna-docusate (SENOKOT-S) 8.6-50 MG tablet Take 1 tablet by mouth at bedtime as needed for mild constipation. Patient not taking: Reported on 07/12/2017 07/21/16   Florinda Marker, MD    Family History Family History  Problem Relation Age of Onset   Alcoholism Father     Social  History Social History   Tobacco Use   Smoking status: Current Every Day Smoker    Packs/day: 0.50    Years: 7.00    Pack years: 3.50    Types: Cigarettes   Smokeless tobacco: Never Used   Tobacco comment: slowing down  Substance Use Topics   Alcohol use: No   Drug use: Yes    Types: IV, Oxycodone    Comment: heroin     Allergies   Vancomycin   Review of Systems Review of Systems   Physical Exam Updated Vital Signs BP (!) 62/36    Pulse (!) 115    Temp (!) 97.5 F (36.4 C)    Resp 18    Ht 5\' 1"  (1.549 m)    Wt 54.4 kg    SpO2 100%    BMI 22.67 kg/m   Physical Exam   ED Treatments / Results  Labs (all labs ordered are listed, but only abnormal results are displayed) Labs Reviewed  LACTIC ACID, PLASMA - Abnormal; Notable for the following components:      Result Value   Lactic Acid, Venous 2.1 (*)    All other components within normal limits  CBC - Abnormal; Notable for the following components:   RBC 3.07 (*)    Hemoglobin 8.2 (*)    HCT 25.3 (*)    Platelets 21 (*)    All other components within normal limits  COMPREHENSIVE METABOLIC PANEL - Abnormal; Notable for the following components:   Sodium 130 (*)    Chloride 95 (*)    CO2 16 (*)    BUN 117 (*)    Creatinine, Ser 7.12 (*)    Calcium 7.5 (*)    Total Protein 5.7 (*)    Albumin 2.0 (*)    AST 62 (*)    Alkaline Phosphatase 167 (*)    Total Bilirubin 2.2 (*)    GFR calc non Af Amer 7 (*)    GFR calc Af Amer 8 (*)    Anion gap 19 (*)    All other components within normal limits  URINALYSIS, ROUTINE W REFLEX MICROSCOPIC - Abnormal; Notable for the following components:   Color, Urine AMBER (*)    APPearance CLOUDY (*)    Hgb urine dipstick LARGE (*)    Protein, ur 100 (*)    Leukocytes,Ua TRACE (*)    Bacteria, UA FEW (*)    All other components within normal limits  RAPID URINE DRUG SCREEN, HOSP PERFORMED - Abnormal; Notable for the following components:   Opiates POSITIVE (*)     Amphetamines POSITIVE (*)    All other components within normal limits  BLOOD GAS, VENOUS - Abnormal; Notable for the following components:   pCO2, Ven 40.0 (*)    Bicarbonate 19.5 (*)    Acid-base deficit 5.8 (*)    All other components within normal limits  DIFFERENTIAL - Abnormal; Notable for the following components:   Lymphs Abs 0.4 (*)    Abs Immature Granulocytes  0.67 (*)    All other components within normal limits  DIC (DISSEMINATED INTRAVASCULAR COAGULATION) PANEL - Abnormal; Notable for the following components:   Prothrombin Time 20.2 (*)    INR 1.7 (*)    aPTT 61 (*)    Fibrinogen 199 (*)    D-Dimer, Quant 6.39 (*)    Platelets 19 (*)    All other components within normal limits  CBC - Abnormal; Notable for the following components:   RBC 2.65 (*)    Hemoglobin 7.0 (*)    HCT 22.1 (*)    Platelets 20 (*)    All other components within normal limits  TROPONIN I - Abnormal; Notable for the following components:   Troponin I 0.04 (*)    All other components within normal limits  CULTURE, BLOOD (ROUTINE X 2)  CULTURE, BLOOD (ROUTINE X 2)  URINE CULTURE  LACTIC ACID, PLASMA  AMMONIA  LIPASE, BLOOD  ETHANOL  PREGNANCY, URINE  PROCALCITONIN  SAVE SMEAR (SSMR)  ADAMTS13 ACTIVITY  BLOOD GAS, ARTERIAL  TYPE AND SCREEN    EKG None  Radiology Dg Chest 2 View  Result Date: 01/20/2019 CLINICAL DATA:  Slurred speech, altered mental changes, and edema in legs/feet. EXAM: CHEST - 2 VIEW COMPARISON:  Chest x-rays from April 11, 2018 and April 22, 2018. CT scan from April 08, 2018. FINDINGS: A nodular opacity seen in the periphery of the left lung. Increased opacity is seen throughout the right lung and in the left perihilar region. The cardiomediastinal silhouette is stable. No pneumothorax. No other changes. IMPRESSION: 1. Increasing bilateral pulmonary opacities suggesting an infectious cause. A somewhat nodular opacity in the periphery of the left lung raises the possibility  of septic emboli given the patient's history. A CT scan could better evaluate. Electronically Signed   By: Dorise Bullion III M.D   On: 01/20/2019 03:15   Ct Head Wo Contrast  Result Date: 01/20/2019 CLINICAL DATA:  Altered mental status. EXAM: CT HEAD WITHOUT CONTRAST TECHNIQUE: Contiguous axial images were obtained from the base of the skull through the vertex without intravenous contrast. COMPARISON:  December 17, 2015 FINDINGS: Brain: The study is somewhat limited due to patient combativeness/motion. Within this limitation, no subdural, epidural, or subarachnoid hemorrhage. Cerebellum, brainstem, and basal cisterns are normal. Ventricles and sulci are unremarkable. No mass effect or midline shift. No acute cortical ischemia or infarct. Vascular: No hyperdense vessel or unexpected calcification. Skull: Normal. Negative for fracture or focal lesion. Sinuses/Orbits: No acute finding. Other: None. IMPRESSION: No acute intracranial abnormalities noted. Electronically Signed   By: Dorise Bullion III M.D   On: 01/20/2019 03:24   Ct Chest Wo Contrast  Result Date: 01/20/2019 CLINICAL DATA:  Abnormal chest x-ray. History of intravenous drug abuse. History of septic emboli. EXAM: CT CHEST WITHOUT CONTRAST TECHNIQUE: Multidetector CT imaging of the chest was performed following the standard protocol without IV contrast. COMPARISON:  CT scan April 08, 2018. FINDINGS: Cardiovascular: The main pulmonary artery is enlarged measuring 3.6 cm. The thoracic aorta and heart are unremarkable. Mediastinum/Nodes: Thyroid and esophagus are normal. No effusions. No adenopathy. Lungs/Pleura: Central airways are normal. No pneumothorax. Bilateral pulmonary nodules, many of which are peripheral. There are multiple cavitary nodules. A representative nodule in the right apex measures 1.9 cm on series 5, image 43. A representative cavitary nodule in the periphery of the left lung measures 2.7 cm on series 5, image 72. Interlobular septal  thickening is noted. There is focal infiltrate in the right base.  Scattered infiltrate throughout the right lung greater than the left. Upper Abdomen: No acute abnormalities identified. Musculoskeletal: No chest wall mass or suspicious bone lesions identified. IMPRESSION: 1. Bilateral pulmonary nodules, many of which are peripheral and cavitated. Given history and appearance, the findings are most consistent with septic emboli. 2. More confluent opacity in the right base may represent a pneumonia, aspiration, or atelectasis. 3. More diffuse opacity on the right may represent a superimposed infectious process. 4. The main pulmonary artery measures 3.6 cm raising the possibility of pulmonary arterial hypertension. Electronically Signed   By: Dorise Bullion III M.D   On: 01/20/2019 04:22    Procedures Procedure Name: Intubation Date/Time: 01/20/2019 9:27 AM Performed by: Blanchie Dessert, MD Pre-anesthesia Checklist: Patient identified, Patient being monitored, Emergency Drugs available, Timeout performed and Suction available Oxygen Delivery Method: Non-rebreather mask Preoxygenation: Pre-oxygenation with 100% oxygen Induction Type: Rapid sequence Ventilation: Mask ventilation without difficulty Laryngoscope Size: Glidescope and 3 Grade View: Grade III Tube size: 7.0 mm Number of attempts: 1 Placement Confirmation: ETT inserted through vocal cords under direct vision,  CO2 detector and Breath sounds checked- equal and bilateral Secured at: 20 cm Tube secured with: ETT holder Dental Injury: Teeth and Oropharynx as per pre-operative assessment  Difficulty Due To: Difficulty was unanticipated      (including critical care time)  Medications Ordered in ED Medications  0.9 %  sodium chloride infusion ( Intravenous New Bag/Given 01/20/19 0535)  acetaminophen (TYLENOL) tablet 650 mg (has no administration in time range)    Or  acetaminophen (TYLENOL) suppository 650 mg (has no administration in  time range)  ondansetron (ZOFRAN) tablet 4 mg (has no administration in time range)    Or  ondansetron (ZOFRAN) injection 4 mg (has no administration in time range)  0.9 %  sodium chloride infusion (Manually program via Guardrails IV Fluids) ( Intravenous Hold 01/20/19 0916)  PHENYLephrine 40 mcg/ml in normal saline Adult IV Push Syringe (has no administration in time range)  ketamine (KETALAR) 10 MG/ML injection (0 mg  Hold 01/20/19 0915)  norepinephrine (LEVOPHED) 4 mg in dextrose 5 % 250 mL (0.016 mg/mL) infusion (has no administration in time range)  sodium chloride 0.9 % bolus 500 mL (has no administration in time range)  sodium chloride 0.9 % bolus 1,632 mL (0 mL/kg  54.4 kg Intravenous Stopped 01/20/19 0423)  piperacillin-tazobactam (ZOSYN) IVPB 3.375 g (0 g Intravenous Stopped 01/20/19 0423)  sodium chloride 0.9 % bolus 1,000 mL (0 mLs Intravenous Stopped 01/20/19 0522)  ceftaroline (TEFLARO) 300 mg in sodium chloride 0.9 % 250 mL IVPB (0 mg Intravenous Stopped 01/20/19 0727)  ketamine 50 mg in normal saline 5 mL (10 mg/mL) syringe (54 mg Intravenous Given 01/20/19 0912)     Initial Impression / Assessment and Plan / ED Course  I have reviewed the triage vital signs and the nursing notes.  Pertinent labs & imaging results that were available during my care of the patient were reviewed by me and considered in my medical decision making (see chart for details).      After intubation RSI patient was given ketamine for sedation.  She remained hypotensive after induction and was given a 500 cc bolus with minimal improvement.  Patient was started on 2.5 of Levophed for blood pressure support.  We will continue giving ketamine boluses for sedation until patient's blood pressure is stable enough to tolerate fentanyl drip.  Plan will be for patient to transfer to ICU at [facility].  Patient was intubated  due to worsening confusion, hypoxia and altered mental status.  CRITICAL CARE Performed by: Edilson Vital Total critical care time: 30 minutes Critical care time was exclusive of separately billable procedures and treating other patients. Critical care was necessary to treat or prevent imminent or life-threatening deterioration. Critical care was time spent personally by me on the following activities: development of treatment plan with patient and/or surrogate as well as nursing, discussions with consultants, evaluation of patient's response to treatment, examination of patient, obtaining history from patient or surrogate, ordering and performing treatments and interventions, ordering and review of laboratory studies, ordering and review of radiographic studies, pulse oximetry and re-evaluation of patient's condition.    Final Clinical Impressions(s) / ED Diagnoses   Final diagnoses:  Delirium  Acute kidney injury superimposed on chronic kidney disease (HCC)  Dehydration  Thrombocytopenia (HCC)  Chronic anemia  Acute cystitis with hematuria  Multifocal pneumonia  Sepsis associated hypotension ([place])  Polysubstance abuse ([place])  DIC (disseminated intravascular coagulation) [facility])    ED Discharge Orders    None       Blanchie Dessert, MD 01/20/19 [phone]

## 2019-01-20 NOTE — Progress Notes (Signed)
Patient has an elevated HR.  Will attempt echo once again once HR is lower.

## 2019-01-20 NOTE — Progress Notes (Signed)
Zosyn 2.25 g IV q8h for SCr 7 Vancomycin 1 g IV x1 this morning - will check VR and re-dose vancomycin as needed

## 2019-01-20 NOTE — ED Notes (Signed)
Lab called at this time to run urine culture and pregnancy and new blood labs.

## 2019-01-20 NOTE — ED Notes (Signed)
Nurse reviewing chart to determine assignment, she will call back in a few minutes.

## 2019-01-20 NOTE — ED Triage Notes (Signed)
Patient is having slurring speech, alter mental changes, edema in legs and feet, and lethargic. Family went out of town and they found patient like this.

## 2019-01-20 NOTE — ED Notes (Signed)
Date and time results received: 01/20/19 0646 (use smartphrase ".now" to insert current time)  Test: troponin Critical Value: 0.04  Name of Provider Notified: [place]  Orders Received? Or Actions Taken?: monitor

## 2019-01-20 NOTE — ED Notes (Signed)
Report attempted at this time to [facility].  RN to call back when available.

## 2019-01-20 NOTE — ED Notes (Signed)
Pt transferred to [facility] via [facility].

## 2019-01-20 NOTE — Progress Notes (Signed)
  Echocardiogram 2D Echocardiogram has been performed.  Molly [name] 01/20/2019, 2:35 PM

## 2019-01-20 NOTE — Progress Notes (Signed)
Spoke with RN re PICC order.  Awaiting blood culture results and approval from renal and ID preferably.  Has 3 PIV working well at this time.

## 2019-01-20 NOTE — ED Notes (Signed)
Pt will not allow 02 to be placed on face. She has sats in upper 80's, restless and yelling.

## 2019-01-20 NOTE — Progress Notes (Signed)
A consult was received from an ED physician for Teflaro per pharmacy dosing.  The patient's profile has been reviewed for ht/wt/allergies/indication/available labs.   A one time order has been placed for Teflaro 300 mg x1.  Further antibiotics/pharmacy consults should be ordered by admitting physician if indicated.       This antibiotic will require ID approval for further dosing                 Thank you, Dorrene German 01/20/2019  5:41 AM

## 2019-01-20 NOTE — Progress Notes (Signed)
A consult was received from an ED physician for zosyn and vancomycin per pharmacy dosing.  The patient's profile has been reviewed for ht/wt/allergies/indication/available labs.   A one time order has been placed for zosyn 3.375 Gm and vancomycin 1 Gm.  Further antibiotics/pharmacy consults should be ordered by admitting physician if indicated.                       Thank you, Dorrene German 01/20/2019  3:51 AM

## 2019-01-20 NOTE — Progress Notes (Signed)
Pts H/H: 7.1/23.1. Per CCM, pt to get 1unit PRBC. Spoke w/ blood bank to relay who state will prepare unit.

## 2019-01-20 NOTE — Progress Notes (Signed)
Spoke w/ pts mother who was able to provide pts full name and birthdate.  Provided updates and unit phone number. Pts mother appreciative.

## 2019-01-20 NOTE — ED Notes (Signed)
AT PRESENT TIME NO OG DUE TO BLEEDING IN THROAT AREA.

## 2019-01-20 NOTE — Progress Notes (Signed)
Order in for PICC. Pt on pressors w/ poor vasculature, most likely will need long term antibiotics. Spoke w/ PICC IV Team. Pts kidney function currently too compromised  to place PICC w/out ok from Nephrology.  CCM currently trying to reach Nephro.

## 2019-01-20 NOTE — Progress Notes (Signed)
Discussed the patients situation with her mother, Zaniyah Wernette who identifies her self as the patients next of kin, POA.  I explained that the patient is most likely terminal and explained why I feel so and her most pressing medical issues.  I asked to begin preparing her self for the patient to worsen over the next day or so and that she may need to make a decision about withdrawal of life support if she gets any worse.

## 2019-01-20 NOTE — ED Notes (Addendum)
Report called earlier to [employer],

## 2019-01-20 NOTE — ED Notes (Signed)
Date and time results received: 01/20/19 0300   Test: lactic acid Critical Value: 2.1  Name of Provider Notified: [place]  Orders Received? Or Actions Taken?: monitor

## 2019-01-20 NOTE — Procedures (Signed)
.  Central Venous Catheter Insertion Procedure Note Molly [name] [id number] 07-19-1986  Procedure: Insertion of Central Venous Catheter Indications: Drug and/or fluid administration  Procedure Details Consent: Unable to obtain consent because of emergent medical necessity. Time Out: Verified patient identification, verified procedure, site/side was marked, verified correct patient position, special equipment/implants available, medications/allergies/relevent history reviewed, required imaging and test results available.  Performed  Maximum sterile technique was used including antiseptics, cap, gloves, gown, hand hygiene, mask and sheet. Skin prep: Chlorhexidine; local anesthetic administered A antimicrobial bonded/coated triple lumen catheter was placed in the right femoral vein due to emergent situation using the Seldinger technique.  Evaluation Blood flow good Complications: No apparent complications Patient did tolerate procedure well. Chest X-ray ordered to verify placement.  CXR: not necessary.  Molly [name] 01/20/2019, 6:11 PM

## 2019-01-20 NOTE — ED Notes (Signed)
Pt keeps refusing oxygen via nasal cannula.  Pt oriented as needed.  MD aware.

## 2019-01-20 NOTE — Progress Notes (Signed)
Spoke w/ pharmacy as pt now requiring levo at higher dose, currently at 74mcgs/min.  Pharmacist verified that 40mcgs/min is max dose for PIVs.  Relayed to CCM. Plan to place CVC.  Pts second H/H is now 7.9/25.3.  Pt currently has 2units PRBCs ordered. Relayed to CCM. Plan to redraw and hold blood for now. Spoke w/ Blood Bank to relay.

## 2019-01-20 NOTE — ED Notes (Addendum)
Staff in room for intubation: Dr. Maryan Rued, Ali Lowe, RN, Patty, RN, [place], [place], [place], [place]  0904 20 mg Etomidate 0905 75 mg Roc for intubation  0906 Pt intubated by Dr. Maryan Rued [phone] + color change, 22@ lip, auscultation for placement performed by Patty, RN 0912 54 mg Ketamine 0914 500 cc NS bolus

## 2019-01-21 ENCOUNTER — Inpatient Hospital Stay (HOSPITAL_COMMUNITY): Payer: Medicaid Other

## 2019-01-21 DIAGNOSIS — R7881 Bacteremia: Secondary | ICD-10-CM

## 2019-01-21 DIAGNOSIS — R0603 Acute respiratory distress: Secondary | ICD-10-CM

## 2019-01-21 DIAGNOSIS — Z8679 Personal history of other diseases of the circulatory system: Secondary | ICD-10-CM

## 2019-01-21 DIAGNOSIS — Z9911 Dependence on respirator [ventilator] status: Secondary | ICD-10-CM

## 2019-01-21 DIAGNOSIS — F1990 Other psychoactive substance use, unspecified, uncomplicated: Secondary | ICD-10-CM

## 2019-01-21 DIAGNOSIS — B9561 Methicillin susceptible Staphylococcus aureus infection as the cause of diseases classified elsewhere: Secondary | ICD-10-CM

## 2019-01-21 DIAGNOSIS — I959 Hypotension, unspecified: Secondary | ICD-10-CM

## 2019-01-21 DIAGNOSIS — I330 Acute and subacute infective endocarditis: Secondary | ICD-10-CM

## 2019-01-21 DIAGNOSIS — F17210 Nicotine dependence, cigarettes, uncomplicated: Secondary | ICD-10-CM

## 2019-01-21 DIAGNOSIS — Z87448 Personal history of other diseases of urinary system: Secondary | ICD-10-CM

## 2019-01-21 DIAGNOSIS — Z881 Allergy status to other antibiotic agents status: Secondary | ICD-10-CM

## 2019-01-21 DIAGNOSIS — Z8614 Personal history of Methicillin resistant Staphylococcus aureus infection: Secondary | ICD-10-CM

## 2019-01-21 LAB — CBC
HCT: 27 % — ABNORMAL LOW (ref 36.0–46.0)
HCT: 25.6 % — ABNORMAL LOW (ref 36.0–46.0)
Hemoglobin: 8.4 g/dL — ABNORMAL LOW (ref 12.0–15.0)
Hemoglobin: 8.3 g/dL — ABNORMAL LOW (ref 12.0–15.0)
MCH: 25.8 pg — ABNORMAL LOW (ref 26.0–34.0)
MCH: 27 pg (ref 26.0–34.0)
MCHC: 31.1 g/dL (ref 30.0–36.0)
MCHC: 32.4 g/dL (ref 30.0–36.0)
MCV: 82.8 fL (ref 80.0–100.0)
MCV: 83.4 fL (ref 80.0–100.0)
Platelets: 11 10*3/uL — CL (ref 150–400)
Platelets: 9 10*3/uL — CL (ref 150–400)
RBC: 3.26 MIL/uL — ABNORMAL LOW (ref 3.87–5.11)
RBC: 3.07 MIL/uL — ABNORMAL LOW (ref 3.87–5.11)
RDW: 15.6 % — ABNORMAL HIGH (ref 11.5–15.5)
RDW: 15.9 % — ABNORMAL HIGH (ref 11.5–15.5)
WBC: 15.6 10*3/uL — ABNORMAL HIGH (ref 4.0–10.5)
WBC: 13.9 10*3/uL — ABNORMAL HIGH (ref 4.0–10.5)
nRBC: 0 % (ref 0.0–0.2)
nRBC: 0 % (ref 0.0–0.2)

## 2019-01-21 LAB — ADAMTS13 ACTIVITY REFLEX

## 2019-01-21 LAB — GLUCOSE, CAPILLARY: Glucose-Capillary: 82 mg/dL (ref 70–99)

## 2019-01-21 LAB — BASIC METABOLIC PANEL
Anion gap: 12 (ref 5–15)
BUN: 129 mg/dL — ABNORMAL HIGH (ref 6–20)
CO2: 14 mmol/L — ABNORMAL LOW (ref 22–32)
Calcium: 7.2 mg/dL — ABNORMAL LOW (ref 8.9–10.3)
Chloride: 108 mmol/L (ref 98–111)
Creatinine, Ser: 6.72 mg/dL — ABNORMAL HIGH (ref 0.44–1.00)
GFR calc Af Amer: 9 mL/min — ABNORMAL LOW (ref >60)
GFR calc non Af Amer: 7 mL/min — ABNORMAL LOW (ref >60)
Glucose, Bld: 78 mg/dL (ref 70–99)
Potassium: 5.1 mmol/L (ref 3.5–5.1)
Sodium: 134 mmol/L — ABNORMAL LOW (ref 135–145)

## 2019-01-21 LAB — ADAMTS13 ACTIVITY: Adamts 13 Activity: 46.5 % — ABNORMAL LOW (ref >66.8)

## 2019-01-21 MED ORDER — PANTOPRAZOLE SODIUM 40 MG IV SOLR
40.0000 mg | INTRAVENOUS | Status: DC
  Administered 2019-01-21 – 2019-01-23 (×3): 40 mg via INTRAVENOUS
  Filled 2019-01-21 (×2): qty 40

## 2019-01-21 MED ORDER — STERILE WATER FOR INJECTION IV SOLN
INTRAVENOUS | Status: DC
  Administered 2019-01-21 – 2019-01-22 (×3): via INTRAVENOUS
  Filled 2019-01-21 (×8): qty 850

## 2019-01-21 MED ORDER — CHLORHEXIDINE GLUCONATE 0.12 % MT SOLN
OROMUCOSAL | Status: AC
  Filled 2019-01-21: qty 15

## 2019-01-21 NOTE — Progress Notes (Signed)
NAME:  Molly [name], MRN:  [id number], DOB:  1986-01-29, LOS: 1 ADMISSION DATE:  01/20/2019, CONSULTATION DATE: 01/20/2019 REFERRING MD:  Bonner Puna, CHIEF COMPLAINT:  Sepsis  Brief History   3 of IV drug abuse with a previous history of inadequately treated tricuspid vegetation.  Blood cultures performed on 04/10/2018 showed MRSA  History of present illness   Patient is a 33 year old white female with a history of IV drug abuse/polysubstance abuse (presented with urine drug screen positive for opiates and amphetamines ) and a previous history in June or July 2019 of inadequately treated endocarditis having signed out AMA at that time.  She had a tricuspid vegetation at that time.  She re-presented hypotensive and in respiratory distress requiring intubation at [facility] long.  CT scan of the chest was performed there showing bilateral nodular but in particular cavitary lesions consistent with septic emboli from tricuspid source.  She was given a dose of vancomycin and Zosyn and Teflaro at [place] long and transferred here.  On my evaluation the patient is sedated, on low-dose Levophed with a blood pressure of 100/80 intubated on PR CV with tidal volumes of 450 respiratory rate 18.  Initial blood gas shows a pH of 7.2 PCO2 of 45 adequate oxygenation.  Echocardiogram performed at [facility] is currently pending.  Previous blood cultures from June of last year were positive for MRSA.  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, history of sacral osteomyelitis, history of pancytopenia, history of MRSA bacteremia, history of chronic kidney disease stage III with a GFR estimated between 34 and 60, history of recurrent skin abscesses, history of abscess in epidural space  Significant Hospital Events   Intubation and transfer to [facility] for ICU therapy  Consults:  nephrology  Procedures:  4/4 Intubation, R femoral  line   Significant Diagnostic Tests:  Echocardiogram results pending CT scan of the chest MRI of the spine pending   Micro Data:  Blood cultures x2 results pending ID panel with MSSA  Antimicrobials:  Vancomycin Zosyn Teflaro  Interim history/subjective:  4/4 Patient is sedated intubated on Levophed and fentanyl for sedation 4/5  Patient sedated on vent, UOP negligible, Cr unchanged, HGB stable at 8.7 after 1 unit yesterday.  On 14 ug levofed.  Discussed with family and made it clear that she is almost certainly terminal based on sepsis, renal failure, septic emboli pancytopenia and DIC  They understand that if she does not have significant improvement in her renal function in the next 24-48 the discussion will need to be one of compassionate withdrawal of life support.  Objective   Blood pressure (!) 103/47, pulse (!) 113, temperature 98.1 F (36.7 C), resp. rate (!) 22, height 5\' 1"  (1.549 m), weight 48.5 kg, SpO2 100 %.    Vent Mode: PRVC FiO2 (%):  [50 %-100 %] 50 % Set Rate:  [18 bmp-25 bmp] 25 bmp Vt Set:  [450 mL] 450 mL PEEP:  [5 cmH20] 5 cmH20 Plateau Pressure:  [13 cmH20-19 cmH20] 13 cmH20   Intake/Output Summary (Last 24 hours) at 01/21/2019 0915 Last data filed at 01/21/2019 0800 Gross per 24 hour  Intake 5335.82 ml  Output 120 ml  Net 5215.82 ml   Filed Weights   01/20/19 0130 01/20/19 1200 01/21/19 0600  Weight: 54.4 kg 45.8 kg 48.5 kg    Examination: General: White female appearing significantly older than stated age HENT: Intubated with multiple  scabbed areas of her face Lungs: Clear Cardiovascular: Regular Abdomen: Bowel sounds hypoactive Extremities: Areas of scabbing on legs, 1+ edema Neuro: sedated Non focal GU: foley  Resolved Hospital Problem list   none  Assessment & Plan:  1. 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 Severe Sepsis  IVF started, on minimal pressors now  3. DIC Supportive care  4.   Severe AKI Will consult nephrology, doubt a dialysis candidate  5.  Hx IV drug abuse, Urine tox + amphetamines and opiates Continue fentanyl  6.  Hypotension 2nd to 1 and 2 As above  7.  DIC 2nd to 1 and 2 Supportive care  8.  Acute hepatic injury Monitor intermittenly  9.  Septic pulmonary emboli 2nd to 1 As above  Patients prognosis is grim will attempt to discuss with POA.    Best practice:  Diet: npo Pain/Anxiety/Delirium protocol (if indicated):Fentanyl VAP protocol (if indicated): probable mrsa septic emboli DVT prophylaxis: scd GI prophylaxis: protocol Glucose control: protocal Mobility: sedated Code Status: full Family Communication: none yet Disposition:   Labs   CBC: Recent Labs  Lab 01/20/19 0157 01/20/19 0353 01/20/19 0433  01/20/19 1338 01/20/19 1559 01/20/19 1734 01/21/19 0110 01/21/19 0517  WBC 8.3 8.3  --   --  14.4*  --   --  15.6* 13.9*  NEUTROABS  --  6.3  --   --   --   --   --   --   --   HGB 8.2* 7.0*  --    < > 7.9* 7.1* 7.1* 8.4* 8.3*  HCT 25.3* 22.1*  --    < > 25.3* 21.0* 23.1* 27.0* 25.6*  MCV 82.4 83.4  --   --  81.4  --   --  82.8 83.4  PLT 21* 20* 19*  --  15*  --   --  11* 9*   < > = values in this interval not displayed.    Basic Metabolic Panel: Recent Labs  Lab 01/20/19 0157 01/20/19 1224 01/20/19 1338 01/20/19 1559 01/21/19 0517  NA 130* 134* 134* 135 134*  K 5.0 4.9 5.2* 5.1 5.1  CL 95*  --  103  --  108  CO2 16*  --  16*  --  14*  GLUCOSE 85  --  79  --  78  BUN 117*  --  132*  --  129*  CREATININE 7.12*  --  6.91*  --  6.72*  CALCIUM 7.5*  --  7.2*  --  7.2*   GFR: Estimated Creatinine Clearance: 9.2 mL/min (A) (by C-G formula based on SCr of 6.72 mg/dL (H)). Recent Labs  Lab 01/20/19 0157 01/20/19 0353 01/20/19 0433 01/20/19 1338 01/21/19 0110 01/21/19 0517  PROCALCITON  --   --  72.62  --   --   --   WBC 8.3 8.3  --  14.4* 15.6* 13.9*  LATICACIDVEN 2.1* 1.6  --   --   --   --     Liver  Function Tests: Recent Labs  Lab 01/20/19 0157  AST 62*  ALT 22  ALKPHOS 167*  BILITOT 2.2*  PROT 5.7*  ALBUMIN 2.0*   Recent Labs  Lab 01/20/19 0157  LIPASE 30   Recent Labs  Lab 01/20/19 0157  AMMONIA 22    ABG    Component Value Date/Time   PHART 7.270 (L) 01/20/2019 1559   PCO2ART 38.0 01/20/2019 1559   PO2ART 90.0 01/20/2019 1559  HCO3 17.4 (L) 01/20/2019 1559   TCO2 19 (L) 01/20/2019 1559   ACIDBASEDEF 9.0 (H) 01/20/2019 1559   O2SAT 96.0 01/20/2019 1559     Coagulation Profile: Recent Labs  Lab 01/20/19 0433  INR 1.7*    Cardiac Enzymes: Recent Labs  Lab 01/20/19 0535  TROPONINI 0.04*    HbA1C: No results found for: HGBA1C  CBG: Recent Labs  Lab 01/20/19 1044 01/20/19 1625 01/21/19 0120  GLUCAP 73 74 82    Review of Systems:   Patient sedated, most of history from chart, care givers  Past Medical History  She,  has a past medical history of Abscess in epidural space of T12/L1 spine, Abscess of skin, CKD (chronic kidney disease) stage 3, GFR 30-59 ml/min ([place]), Drug-seeking behavior, Hypersplenism, IV drug user, MRSA (methicillin resistant Staphylococcus aureus) infection, MRSA bacteremia, Pancytopenia ([place]), 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), Septic pulmonary embolism ([place]), and Tobacco abuse.   Surgical History    Past Surgical History:  Procedure Laterality Date  . IR GENERIC HISTORICAL  07/19/2016   IR LUMBAR DISC ASPIRATION W/IMG GUIDE 07/19/2016 Arne Cleveland, MD [facility]INTERV RAD  . RADIOLOGY WITH ANESTHESIA N/A 04/11/2018   Procedure: MRI OF LUMBAR AND THORACIC SPINE WITHOUT CONTRAST WITH ANESTHESIA;  Surgeon: Radiologist, Medication, MD;  Location: [place];  Service: Radiology;  Laterality: N/A;  . TEE WITHOUT CARDIOVERSION N/A 07/04/2013   Procedure: TRANSESOPHAGEAL ECHOCARDIOGRAM (TEE);  Surgeon: Larey Dresser, MD;  [facility];  Service: Cardiovascular;  Laterality: N/A;  . TONSILLECTOMY     33 years old      Social History   reports that she has been smoking cigarettes. She has a 3.50 pack-year smoking history. She has never used smokeless tobacco. She reports current drug use. Drugs: IV and Oxycodone. She reports that she does not drink alcohol.   Family History   Her family history includes Alcoholism in her father.   Allergies Allergies  Allergen Reactions  . Vancomycin Other (See Comments)    Possible contributor to AKI and thrombocytopenia     Home Medications  Prior to Admission medications   Medication Sig Start Date End Date Taking? Authorizing Provider  ibuprofen (ADVIL,MOTRIN) 200 MG tablet Take 200-400 mg by mouth every 6 (six) hours as needed for moderate pain.    Yes [provider]  senna-docusate (SENOKOT-S) 8.6-50 MG tablet Take 1 tablet by mouth at bedtime as needed for mild constipation. Patient not taking: Reported on 07/12/2017 07/21/16   Florinda Marker, MD     Critical care time: 45 minutes

## 2019-01-21 NOTE — Progress Notes (Signed)
[facility] Progress Note  Subjective: BUN 129, Cr 6.72 today, 120 cc urine output yesterday. I/O +5.2 L yest. BP's soft on levo gtt 14 ug/min.  Vent 50%.   Vitals:   01/21/19 0721 01/21/19 0800 01/21/19 0900 01/21/19 1000  BP: 115/69 (!) 103/47 (!) 93/48 (!) 98/55  Pulse: (!) 121 (!) 113 (!) 121 (!) 129  Resp: (!) 26 (!) 22 (!) 25 (!) 25  Temp:  98.1 F (36.7 C) 98.2 F (36.8 C) 98.2 F (36.8 C)  TempSrc:      SpO2: 100% 100% 100% 100%  Weight:      Height:        Inpatient medications: . sodium chloride   Intravenous Once  . chlorhexidine gluconate (MEDLINE KIT)  15 mL Mouth Rinse BID  . Chlorhexidine Gluconate Cloth  6 each Topical Q0600  . mouth rinse  15 mL Mouth Rinse 10 times per day  . pantoprazole (PROTONIX) IV  40 mg Intravenous Q24H   .  ceFAZolin (ANCEF) IV Stopped (01/20/19 2138)  . fentaNYL infusion INTRAVENOUS 100 mcg/hr (01/21/19 1000)  . norepinephrine (LEVOPHED) Adult infusion 14 mcg/min (01/21/19 1000)  .  sodium bicarbonate (isotonic) infusion in sterile water 75 mL/hr at 01/21/19 1000   acetaminophen **OR** acetaminophen, fentaNYL, midazolam, [DISCONTINUED] ondansetron **OR** ondansetron (ZOFRAN) IV    Exam: Gen on vent No rash, cyanosis or gangrene Sclera anicteric, throat w ETT  No jvd or bruits Chest scattered rhonchi bilat RRR no MRG Abd soft ntnd no mass or ascites +bs MS no joint effusions or deformity Ext 1+ LE / UE edema, no wounds or ulcers Neuro is sedated on the vent    Home meds:  - ibuprofen prn/ senokot prn   UA 4/4 cloudy, 20-50 rbc/ wbc, 0-5 epi, few bact, prot 100  UA 04/22/2018  Cloudy, 20-50 rbc, 11-20 wbc, rare bact, prot 100  UA 03/2018   Cloudy, 21-50 rbc/ >59 wbc, no epis, rare bact, prot 100  Renal US - pending  Na 130  K 5  CO2 16  AG 19  Alb 2.0   NH3 22  Tbili 2.2     Assessment: 1. AKI on CKD III - baseline creat 1.5- 2.5 from 2019.  Hx prior AKI episodes as complications of sepsis.  Multiple  admits from IVDU related complications over the last 5 years. Pt is very sick now, per primary notes will need to consider withdrawal of care in 24-48hrs if not improving. No other suggestions.  Will sign off.   2. Septic shock on pressors 3. Acute liver injury 4. Staph bacteremia 5. Hx recurrent sepsis as complication of IVDU - multiple complications inculding discitis, splenic abscess, endocarditis in the past 6. IV drug abuse    [facility] 01/21/2019, 10:51 AM  Iron/TIBC/Ferritin/ %Sat    Component Value Date/Time   IRON 37 04/23/2018 0859   TIBC 211 (L) 04/23/2018 0859   FERRITIN 170 04/23/2018 0859   IRONPCTSAT 18 04/23/2018 0859   Recent Labs  Lab 01/20/19 0157 01/20/19 0433  01/21/19 0517  NA 130*  --    < > 134*  K 5.0  --    < > 5.1  CL 95*  --    < > 108  CO2 16*  --    < > 14*  GLUCOSE 85  --    < > 78  BUN 117*  --    < > 129*  CREATININE 7.12*  --    < >  6.72*  CALCIUM 7.5*  --    < > 7.2*  ALBUMIN 2.0*  --   --   --   INR  --  1.7*  --   --    < > = values in this interval not displayed.   Recent Labs  Lab 01/20/19 0157  AST 62*  ALT 22  ALKPHOS 167*  BILITOT 2.2*  PROT 5.7*   Recent Labs  Lab 01/21/19 0517  WBC 13.9*  HGB 8.3*  HCT 25.6*  PLT 9*

## 2019-01-21 NOTE — Progress Notes (Signed)
Dr Jonnie Finner did not approve PICC placement. RN notified states has CVC working well. Will d/c PICC order.

## 2019-01-21 NOTE — Progress Notes (Signed)
Spoke to RN to bring pt to MRI. RN states pt is too unstable at this time, we will follow up later in the morning.

## 2019-01-21 NOTE — Progress Notes (Signed)
4/5 @ 2:21 MRI attempted to get pt per RN pt still unstable mr to check back in morning

## 2019-01-21 NOTE — Consult Note (Signed)
[facility]       Reason for Consult: Staph bacteremia    Referring Physician: CHAMP autoconsult  Principal Problem:   Sepsis with multiple organ dysfunction (MOD) ([place]) Active Problems:   Intravenous drug abuse, continuous ([place])   Polysubstance abuse ([place])   Acute renal failure (ARF) ([place])   Acute septic pulmonary embolism with acute cor pulmonale (June 2019)   Acute encephalopathy   Thrombocytopenia (HCC)   Sepsis with disseminated intravascular coagulopathy (DIC) ([place])   Sepsis ([place])   . sodium chloride   Intravenous Once  . chlorhexidine gluconate (MEDLINE KIT)  15 mL Mouth Rinse BID  . Chlorhexidine Gluconate Cloth  6 each Topical Q0600  . mouth rinse  15 mL Mouth Rinse 10 times per day  . pantoprazole (PROTONIX) IV  40 mg Intravenous Q24H    Recommendations: Cefazolin Repeat blood cultures No TEE indicated from ID standpoint, vegetation noted   Assessment: She has TV endocarditis, previously MRSA now MSSA.  Will watch cultures to be sure they correlate.    Dr. Tommy Medal on tomorrow    Antibiotics: cefazolin  HPI: Molly [name] is a 33 y.o. female with IVDU and previously known TV endocarditis with incompletely treated endocarditis earlier in 2019 before leaving AMA here with hypotension, respiratory distress and with MSSA bacteremia and moderate TV vegetation.  No history from the patient as she is intubated.  Also with renal failure.  Has a history of kidney injury and thrombocytopenia to vancomycin in the past.     Review of Systems:  Unable to be assessed due to patient factors  Past Medical History:  Diagnosis Date  . 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 Abscess of skin    buttock - most recently 2009/10  . CKD (chronic kidney disease) stage 3, GFR 30-59 ml/min (HCC)   . Drug-seeking behavior   . Hypersplenism   . IV drug user   . MRSA (methicillin resistant Staphylococcus aureus) infection   . MRSA  bacteremia   . Pancytopenia ([place])   . 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 Septic pulmonary embolism ([place])   . Tobacco abuse     Social History   Tobacco Use  . Smoking status: Current Every Day Smoker    Packs/day: 0.50    Years: 7.00    Pack years: 3.50    Types: Cigarettes  . Smokeless tobacco: Never Used  . Tobacco comment: slowing down  Substance Use Topics  . Alcohol use: No  . Drug use: Yes    Types: IV, Oxycodone    Comment: heroin    Family History  Problem Relation Age of Onset  . Alcoholism Father     Allergies  Allergen Reactions  . Vancomycin Other (See Comments)    Possible contributor to AKI and thrombocytopenia  history above from chart, unable to obtain from patient  Physical Exam: Constitutional: chronically ill-appearing, sedated Vitals:   01/21/19 1124 01/21/19 1200  BP: (!) 96/48 (!) 108/53  Pulse: (!) 119 (!) 127  Resp: (!) 32 (!) 28  Temp:  99.1 F (37.3 C)  SpO2: 100% 99%   EYES: anicteric ENMT: +ET Cardiovascular: Cor RRR Respiratory: CTA B; respiratory effort on vent GI: Bowel sounds are normal, liver is not enlarged, spleen is not enlarged Musculoskeletal: no edema; right femoral line Skin: dry, scaly rash on face  Hematologic: no cervical lad  Lab Results  Component Value Date   WBC 13.9 (H)  01/21/2019   HGB 8.3 (L) 01/21/2019   HCT 25.6 (L) 01/21/2019   MCV 83.4 01/21/2019   PLT 9 (LL) 01/21/2019    Lab Results  Component Value Date   CREATININE 6.72 (H) 01/21/2019   BUN 129 (H) 01/21/2019   NA 134 (L) 01/21/2019   K 5.1 01/21/2019   CL 108 01/21/2019   CO2 14 (L) 01/21/2019    Lab Results  Component Value Date   ALT 22 01/20/2019   AST 62 (H) 01/20/2019   ALKPHOS 167 (H) 01/20/2019     Microbiology: Recent Results (from the past 240 hour(s))  Blood culture (routine x 2)     Status: Abnormal (Preliminary result)   Collection Time: 01/20/19  1:57 AM  Result Value Ref Range Status   Specimen  Description   Final    BLOOD LEFT ANTECUBITAL Performed at [facility], [place] [address], [place], [place] [address]    Special Requests   Final    BOTTLES DRAWN AEROBIC AND ANAEROBIC Blood Culture adequate volume Performed at [facility] [address], [place], [place] [address]    Culture  Setup Time   Final    GRAM POSITIVE COCCI IN BOTH AEROBIC AND ANAEROBIC BOTTLES CRITICAL RESULT CALLED TO, READ BACK BY AND VERIFIED WITH: PHARMD P DANG [id number] AT 66 BY CM Performed at [facility], [place] [address] [place], [place] [address]    Culture STAPHYLOCOCCUS AUREUS (A)  Final   Report Status PENDING  Incomplete  Blood culture (routine x 2)     Status: Abnormal (Preliminary result)   Collection Time: 01/20/19  2:02 AM  Result Value Ref Range Status   Specimen Description   Final    BLOOD RIGHT ANTECUBITAL Performed at [facility] [address] [place], [place] [address]    Special Requests   Final    BOTTLES DRAWN AEROBIC AND ANAEROBIC Blood Culture adequate volume Performed at [facility] [address] [place], [place] [address]    Culture  Setup Time   Final    GRAM POSITIVE COCCI IN BOTH AEROBIC AND ANAEROBIC BOTTLES CRITICAL RESULT CALLED TO, READ BACK BY AND VERIFIED WITH: PHARMD P DANG [id number] AT 1426 BY CM Performed at [facility], [place] [address], [place], [place] [address]    Culture STAPHYLOCOCCUS AUREUS (A)  Final   Report Status PENDING  Incomplete  Blood Culture ID Panel (Reflexed)     Status: Abnormal   Collection Time: 01/20/19  2:02 AM  Result Value Ref Range Status   Enterococcus species NOT DETECTED NOT DETECTED Final   Listeria monocytogenes NOT DETECTED NOT DETECTED Final   Staphylococcus species DETECTED (A) NOT DETECTED Final    Comment: CRITICAL RESULT CALLED TO, READ BACK BY AND VERIFIED WITH: PHARMD P DANG [id number] AT 1630 BY CM    Staphylococcus aureus  (BCID) DETECTED (A) NOT DETECTED Final    Comment: Methicillin (oxacillin) susceptible Staphylococcus aureus (MSSA). Preferred therapy is anti staphylococcal beta lactam antibiotic (Cefazolin or Nafcillin), unless clinically contraindicated. CRITICAL RESULT CALLED TO, READ BACK BY AND VERIFIED WITH: PHARMD P DANG [id number] AT 1630 BY CM    Methicillin resistance NOT DETECTED NOT DETECTED Final   Streptococcus species NOT DETECTED NOT DETECTED Final   Streptococcus agalactiae NOT DETECTED NOT DETECTED Final   Streptococcus pneumoniae NOT DETECTED NOT DETECTED Final   Streptococcus pyogenes NOT DETECTED NOT DETECTED Final   Acinetobacter baumannii NOT DETECTED NOT  DETECTED Final   Enterobacteriaceae species NOT DETECTED NOT DETECTED Final   Enterobacter cloacae complex NOT DETECTED NOT DETECTED Final   Escherichia coli NOT DETECTED NOT DETECTED Final   Klebsiella oxytoca NOT DETECTED NOT DETECTED Final   Klebsiella pneumoniae NOT DETECTED NOT DETECTED Final   Proteus species NOT DETECTED NOT DETECTED Final   Serratia marcescens NOT DETECTED NOT DETECTED Final   Haemophilus influenzae NOT DETECTED NOT DETECTED Final   Neisseria meningitidis NOT DETECTED NOT DETECTED Final   Pseudomonas aeruginosa NOT DETECTED NOT DETECTED Final   Candida albicans NOT DETECTED NOT DETECTED Final   Candida glabrata NOT DETECTED NOT DETECTED Final   Candida krusei NOT DETECTED NOT DETECTED Final   Candida parapsilosis NOT DETECTED NOT DETECTED Final   Candida tropicalis NOT DETECTED NOT DETECTED Final    Comment: Performed at [facility], [place] [address], [place], [place] [address]  Urine culture     Status: None (Preliminary result)   Collection Time: 01/20/19  2:52 AM  Result Value Ref Range Status   Specimen Description   Final    URINE, RANDOM Performed at [facility] [address] [place], [place] [address]    Special Requests   Final    NONE Performed at [facility], [place] [address] [place], [place] [address]    Culture   Final    CULTURE REINCUBATED FOR BETTER GROWTH Performed at [facility], [place] [address] [place], [place] [address]    Report Status PENDING  Incomplete  MRSA PCR Screening     Status: None   Collection Time: 01/20/19  2:55 PM  Result Value Ref Range Status   MRSA by PCR NEGATIVE NEGATIVE Final    Comment:        The GeneXpert MRSA Assay (FDA approved for NASAL specimens only), is one component of a comprehensive MRSA colonization surveillance program. It is not intended to diagnose MRSA infection nor to guide or monitor treatment for MRSA infections. Performed at [facility], [place] [address] [place], [place] [address]     Makaria Poarch W Daionna Crossland, [facility] [email or web address] 01/21/2019, 12:42 PM

## 2019-01-22 DIAGNOSIS — A4101 Sepsis due to Methicillin susceptible Staphylococcus aureus: Principal | ICD-10-CM

## 2019-01-22 DIAGNOSIS — D65 Disseminated intravascular coagulation [defibrination syndrome]: Secondary | ICD-10-CM

## 2019-01-22 DIAGNOSIS — N3001 Acute cystitis with hematuria: Secondary | ICD-10-CM

## 2019-01-22 DIAGNOSIS — R6520 Severe sepsis without septic shock: Secondary | ICD-10-CM

## 2019-01-22 DIAGNOSIS — N179 Acute kidney failure, unspecified: Secondary | ICD-10-CM

## 2019-01-22 DIAGNOSIS — B9562 Methicillin resistant Staphylococcus aureus infection as the cause of diseases classified elsewhere: Secondary | ICD-10-CM

## 2019-01-22 DIAGNOSIS — R6521 Severe sepsis with septic shock: Secondary | ICD-10-CM

## 2019-01-22 DIAGNOSIS — I071 Rheumatic tricuspid insufficiency: Secondary | ICD-10-CM

## 2019-01-22 DIAGNOSIS — J189 Pneumonia, unspecified organism: Secondary | ICD-10-CM

## 2019-01-22 DIAGNOSIS — R011 Cardiac murmur, unspecified: Secondary | ICD-10-CM

## 2019-01-22 DIAGNOSIS — D696 Thrombocytopenia, unspecified: Secondary | ICD-10-CM

## 2019-01-22 LAB — BASIC METABOLIC PANEL
Anion gap: 14 (ref 5–15)
BUN: 129 mg/dL — ABNORMAL HIGH (ref 6–20)
CO2: 19 mmol/L — ABNORMAL LOW (ref 22–32)
Calcium: 7 mg/dL — ABNORMAL LOW (ref 8.9–10.3)
Chloride: 103 mmol/L (ref 98–111)
Creatinine, Ser: 6.9 mg/dL — ABNORMAL HIGH (ref 0.44–1.00)
GFR calc Af Amer: 8 mL/min — ABNORMAL LOW (ref >60)
GFR calc non Af Amer: 7 mL/min — ABNORMAL LOW (ref >60)
Glucose, Bld: 72 mg/dL (ref 70–99)
Potassium: 5.3 mmol/L — ABNORMAL HIGH (ref 3.5–5.1)
Sodium: 136 mmol/L (ref 135–145)

## 2019-01-22 LAB — URINE CULTURE: Culture: >=100000 — AB

## 2019-01-22 LAB — MAGNESIUM
Magnesium: 2.3 mg/dL (ref 1.7–2.4)
Magnesium: 2.2 mg/dL (ref 1.7–2.4)

## 2019-01-22 LAB — CBC
HCT: 23.2 % — ABNORMAL LOW (ref 36.0–46.0)
Hemoglobin: 7.2 g/dL — ABNORMAL LOW (ref 12.0–15.0)
MCH: 25.5 pg — ABNORMAL LOW (ref 26.0–34.0)
MCHC: 31 g/dL (ref 30.0–36.0)
MCV: 82.3 fL (ref 80.0–100.0)
Platelets: 7 10*3/uL — CL (ref 150–400)
RBC: 2.82 MIL/uL — ABNORMAL LOW (ref 3.87–5.11)
RDW: 16.3 % — ABNORMAL HIGH (ref 11.5–15.5)
WBC: 15.2 10*3/uL — ABNORMAL HIGH (ref 4.0–10.5)
nRBC: 0 % (ref 0.0–0.2)

## 2019-01-22 LAB — CULTURE, BLOOD (ROUTINE X 2)
Special Requests: ADEQUATE
Special Requests: ADEQUATE

## 2019-01-22 LAB — GLUCOSE, CAPILLARY
Glucose-Capillary: 100 mg/dL — ABNORMAL HIGH (ref 70–99)
Glucose-Capillary: 154 mg/dL — ABNORMAL HIGH (ref 70–99)

## 2019-01-22 LAB — PHOSPHORUS
Phosphorus: 5.6 mg/dL — ABNORMAL HIGH (ref 2.5–4.6)
Phosphorus: 5.6 mg/dL — ABNORMAL HIGH (ref 2.5–4.6)

## 2019-01-22 MED ORDER — SODIUM ZIRCONIUM CYCLOSILICATE 5 G PO PACK
5.0000 g | PACK | Freq: Every day | ORAL | Status: DC
  Administered 2019-01-22 – 2019-01-23 (×2): 5 g via ORAL
  Filled 2019-01-22 (×2): qty 1

## 2019-01-22 MED ORDER — OSMOLITE 1.2 CAL PO LIQD
1000.0000 mL | ORAL | Status: AC
  Administered 2019-01-22 – 2019-01-29 (×8): 1000 mL
  Filled 2019-01-22 (×13): qty 1000

## 2019-01-22 MED ORDER — VITAL HIGH PROTEIN PO LIQD: 1000.0000 mL | ORAL | Status: DC

## 2019-01-22 NOTE — Progress Notes (Signed)
       [facility]  Date of Admission:  01/20/2019   Total days of antibiotics 3         ASSESSMENT/PLAN: 33 yo female with history of IVDU and TV MRSA endocarditis partially treated July 2019 with teflaro (vanc allergy) before leaving AMA. Admitted and intubated 01/20/19 with AMS, DIC and sepsis requiring pressor support and worsening renal function. UDS positive for amphetamines & opiates. Blood culture 4/03 x2 positive for MSSA. She is on cefazolin at this point but prognosis is poor with worsening shock, renal function, and thrombocytopenia, and she is not a candidate for dialysis per nephrology. Repeat echo showed continued presence of TV vegetation with worsening of TV regurgitation.   - if there is improvement and possible cessation of pressor support, would need to d/c femoral line to clear infection - would cont. IV cefazolin six weeks after negative blood cultures    Principal Problem:   Sepsis with multiple organ dysfunction (MOD) (HCC) Active Problems:   Intravenous drug abuse, continuous (HCC)   Polysubstance abuse (HCC)   Acute renal failure (ARF) (HCC)   Acute septic pulmonary embolism with acute cor pulmonale (June 2019)   Acute encephalopathy   Thrombocytopenia (HCC)   Sepsis with disseminated intravascular coagulopathy (DIC) (HCC)   Sepsis (HCC)   Scheduled Meds: . sodium chloride   Intravenous Once  . chlorhexidine gluconate (MEDLINE KIT)  15 mL Mouth Rinse BID  . Chlorhexidine Gluconate Cloth  6 each Topical Q0600  . mouth rinse  15 mL Mouth Rinse 10 times per day  . pantoprazole (PROTONIX) IV  40 mg Intravenous Q24H   Continuous Infusions: .  ceFAZolin (ANCEF) IV Stopped (01/21/19 2107)  . fentaNYL infusion INTRAVENOUS 25 mcg/hr (01/22/19 0900)  . norepinephrine (LEVOPHED) Adult infusion 23 mcg/min (01/22/19 0900)  .  sodium bicarbonate (isotonic) infusion in sterile water 75 mL/hr at 01/22/19 0900   PRN Meds:.acetaminophen  **OR** acetaminophen, fentaNYL, midazolam, [DISCONTINUED] ondansetron **OR** ondansetron (ZOFRAN) IV   SUBJECTIVE: Patient sedated and intubated and not following commands or responding to stimuli. Per nurse when fentanyl is decreased she becomes agitated.   Review of Systems: Unable to obtain ROS 2/2 sedation and intubation   Allergies  Allergen Reactions  . Vancomycin Other (See Comments)    Possible contributor to AKI and thrombocytopenia    OBJECTIVE: Vitals:   01/22/19 0700 01/22/19 0752 01/22/19 0800 01/22/19 0900  BP: 106/63 109/63 104/64 103/62  Pulse: (!) 115 (!) 118 (!) 125 (!) 114  Resp: (!) 25  (!) 25 (!) 25  Temp: 100.2 F (37.9 C)  (!) 100.4 F (38 C) (!) 100.4 F (38 C)  TempSrc:      SpO2: 100% 100% 100% 100%  Weight:      Height:       Body mass index is 20.2 kg/m.  Physical Exam Constitution: sedated, not responding to stimuli, moving legs intermittently  HENT: ETT in place, excoriations on forehead  Cardio: LUSB III/VI systolic murmur Respiratory: CTA, no rales or rhonchi  MSK: no edema  Neuro: sedated, not following commands  Skin: b/l LE purpura distal feet, scattered petechiae LE   Lab Results Lab Results  Component Value Date   WBC 15.2 (H) 01/22/2019   HGB 7.2 (L) 01/22/2019   HCT 23.2 (L) 01/22/2019   MCV 82.3 01/22/2019   PLT 7 (LL) 01/22/2019    Lab Results  Component Value Date   CREATININE 6.90 (H) 01/22/2019   BUN   129 (H) 01/22/2019   NA 136 01/22/2019   K 5.3 (H) 01/22/2019   CL 103 01/22/2019   CO2 19 (L) 01/22/2019    Lab Results  Component Value Date   ALT 22 01/20/2019   AST 62 (H) 01/20/2019   ALKPHOS 167 (H) 01/20/2019   BILITOT 2.2 (H) 01/20/2019     Microbiology: Recent Results (from the past 240 hour(s))  Blood culture (routine x 2)     Status: Abnormal   Collection Time: 01/20/19  1:57 AM  Result Value Ref Range Status   Specimen Description   Final    BLOOD LEFT ANTECUBITAL Performed at [facility], [address] [place], [place] [address]    Special Requests   Final    BOTTLES DRAWN AEROBIC AND ANAEROBIC Blood Culture adequate volume Performed at [facility], [address] [place], [place] [address]    Culture  Setup Time   Final    GRAM POSITIVE COCCI IN BOTH AEROBIC AND ANAEROBIC BOTTLES CRITICAL RESULT CALLED TO, READ BACK BY AND VERIFIED WITH: PHARMD P DANG [id number] AT 1630 BY CM    Culture (A)  Final    STAPHYLOCOCCUS AUREUS SUSCEPTIBILITIES PERFORMED ON PREVIOUS CULTURE WITHIN THE LAST 5 DAYS. Performed at [facility], [address] [place], [place] [address]    Report Status 01/22/2019 FINAL  Final  Blood culture (routine x 2)     Status: Abnormal   Collection Time: 01/20/19  2:02 AM  Result Value Ref Range Status   Specimen Description   Final    BLOOD RIGHT ANTECUBITAL Performed at [facility], [address] [place], [place] [address]    Special Requests   Final    BOTTLES DRAWN AEROBIC AND ANAEROBIC Blood Culture adequate volume Performed at [facility], [address] [place], [place] [address]    Culture  Setup Time   Final    GRAM POSITIVE COCCI IN BOTH AEROBIC AND ANAEROBIC BOTTLES CRITICAL RESULT CALLED TO, READ BACK BY AND VERIFIED WITH: PHARMD P DANG [id number] AT 1426 BY CM Performed at [facility], [address] [place], [place] [address]    Culture STAPHYLOCOCCUS AUREUS (A)  Final   Report Status 01/22/2019 FINAL  Final   Organism ID, Bacteria STAPHYLOCOCCUS AUREUS  Final      Susceptibility   Staphylococcus aureus - MIC*    CIPROFLOXACIN <=0.5 SENSITIVE Sensitive     ERYTHROMYCIN <=0.25 SENSITIVE Sensitive     GENTAMICIN <=0.5 SENSITIVE Sensitive     OXACILLIN <=0.25 SENSITIVE Sensitive     TETRACYCLINE <=1 SENSITIVE Sensitive     VANCOMYCIN <=0.5 SENSITIVE Sensitive     TRIMETH/SULFA <=10 SENSITIVE Sensitive     CLINDAMYCIN <=0.25 SENSITIVE  Sensitive     RIFAMPIN <=0.5 SENSITIVE Sensitive     Inducible Clindamycin NEGATIVE Sensitive     * STAPHYLOCOCCUS AUREUS  Blood Culture ID Panel (Reflexed)     Status: Abnormal   Collection Time: 01/20/19  2:02 AM  Result Value Ref Range Status   Enterococcus species NOT DETECTED NOT DETECTED Final   Listeria monocytogenes NOT DETECTED NOT DETECTED Final   Staphylococcus species DETECTED (A) NOT DETECTED Final    Comment: CRITICAL RESULT CALLED TO, READ BACK BY AND VERIFIED WITH: PHARMD P DANG [id number] AT 1630 BY CM    Staphylococcus aureus (BCID) DETECTED (A) NOT DETECTED Final    Comment: Methicillin (oxacillin) susceptible Staphylococcus aureus (MSSA). Preferred therapy   is anti staphylococcal beta lactam antibiotic (Cefazolin or Nafcillin), unless clinically contraindicated. CRITICAL RESULT CALLED TO, READ BACK BY AND VERIFIED WITH: PHARMD P DANG [id number] AT 28 BY CM    Methicillin resistance NOT DETECTED NOT DETECTED Final   Streptococcus species NOT DETECTED NOT DETECTED Final   Streptococcus agalactiae NOT DETECTED NOT DETECTED Final   Streptococcus pneumoniae NOT DETECTED NOT DETECTED Final   Streptococcus pyogenes NOT DETECTED NOT DETECTED Final   Acinetobacter baumannii NOT DETECTED NOT DETECTED Final   Enterobacteriaceae species NOT DETECTED NOT DETECTED Final   Enterobacter cloacae complex NOT DETECTED NOT DETECTED Final   Escherichia coli NOT DETECTED NOT DETECTED Final   Klebsiella oxytoca NOT DETECTED NOT DETECTED Final   Klebsiella pneumoniae NOT DETECTED NOT DETECTED Final   Proteus species NOT DETECTED NOT DETECTED Final   Serratia marcescens NOT DETECTED NOT DETECTED Final   Haemophilus influenzae NOT DETECTED NOT DETECTED Final   Neisseria meningitidis NOT DETECTED NOT DETECTED Final   Pseudomonas aeruginosa NOT DETECTED NOT DETECTED Final   Candida albicans NOT DETECTED NOT DETECTED Final   Candida glabrata NOT DETECTED NOT DETECTED Final   Candida krusei NOT  DETECTED NOT DETECTED Final   Candida parapsilosis NOT DETECTED NOT DETECTED Final   Candida tropicalis NOT DETECTED NOT DETECTED Final    Comment: Performed at [facility], [place] [address], [place], [place] [address]  Urine culture     Status: Abnormal   Collection Time: 01/20/19  2:52 AM  Result Value Ref Range Status   Specimen Description   Final    URINE, RANDOM Performed at [place] [address] [place], [place] [address]    Special Requests   Final    NONE Performed at [facility], [place] [address], [place], [place] [address]    Culture >=100,000 COLONIES/mL STAPHYLOCOCCUS AUREUS (A)  Final   Report Status 01/22/2019 FINAL  Final   Organism ID, Bacteria STAPHYLOCOCCUS AUREUS (A)  Final      Susceptibility   Staphylococcus aureus - MIC*    CIPROFLOXACIN <=0.5 SENSITIVE Sensitive     GENTAMICIN <=0.5 SENSITIVE Sensitive     NITROFURANTOIN <=16 SENSITIVE Sensitive     OXACILLIN <=0.25 SENSITIVE Sensitive     TETRACYCLINE <=1 SENSITIVE Sensitive     VANCOMYCIN <=0.5 SENSITIVE Sensitive     TRIMETH/SULFA <=10 SENSITIVE Sensitive     CLINDAMYCIN <=0.25 SENSITIVE Sensitive     RIFAMPIN <=0.5 SENSITIVE Sensitive     Inducible Clindamycin NEGATIVE Sensitive     * >=100,000 COLONIES/mL STAPHYLOCOCCUS AUREUS  MRSA PCR Screening     Status: None   Collection Time: 01/20/19  2:55 PM  Result Value Ref Range Status   MRSA by PCR NEGATIVE NEGATIVE Final    Comment:        The GeneXpert MRSA Assay (FDA approved for NASAL specimens only), is one component of a comprehensive MRSA colonization surveillance program. It is not intended to diagnose MRSA infection nor to guide or monitor treatment for MRSA infections. Performed at [facility], [place] [address], [place], [place] [address]     Molli Hazard A, DO 01/22/2019, 9:48 AM

## 2019-01-22 NOTE — Progress Notes (Addendum)
Nutrition Follow-up   RD working remotely.  DOCUMENTATION CODES:   Not applicable  INTERVENTION:   Tube Feeding: Osmolite 1.2 at 55 ml/hr Begin at 20 ml/hr; titrate by 5 mL q 4 hours until goal rate of 55 ml/hr Provides 73 g of  (1.6 g/kg EDW), 1584 kcals, 1069 mL of free water Meets 100% of estimated needs   NUTRITION DIAGNOSIS:   Inadequate oral intake related to inability to eat(pt sedated and ventilated ) as evidenced by NPO status.  Being addressed via TF   GOAL:   Provide needs based on ASPEN/SCCM guidelines  Progressing  MONITOR:   Vent status, Labs, Weight trends, I & O's, Skin  REASON FOR ASSESSMENT:   Ventilator    ASSESSMENT:    33 y/o female with h/o IVDU/polysubstance abuse and MRSA bacteremia/TV endocarditis/spinal osteomyelitis/septic pulmonary emboli (left AMA July 2019) who presented to the ED with AMS found to be septic with hypothermia, hypotension, and hypoxia.   Patient is currently intubated on ventilator support, on levophed, fentanyl for sedation MV: 11.1 L/min Temp (24hrs), Avg:99.5 F (37.5 C), Min:98.6 F (37 C), Max:100.6 F (38.1 C)  Propofol: NONE  OG tube with tip in stomach. No BM, abdomen soft, BS present  Nephrology following but no plan for RRT at present. Noted prognosis is poor.Noted possible compassionate withdrawal of life support in 24-48 hours if pt does not improve   Admission wt 45.8 kg; current wt 48.5 kg (4/5). Net + for this admission. Utilizing 45.8 g as EDW Minimal UOP   Labs: potassium 5.3 (H), BUN 129, Creatinine 6.90 Meds: sodium bicarb in sterile water at 75 ml/hr, fentanyl drip, versed pushes, levophed  Diet Order:   Diet Order            Diet NPO time specified  Diet effective now              EDUCATION NEEDS:   Not appropriate for education at this time  Skin:  Skin Assessment: Reviewed RN Assessment(petechiae)  Last BM:  PTA  Height:   Ht Readings from Last 1 Encounters:  01/20/19  5\' 1"  (1.549 m)    Weight:   Wt Readings from Last 1 Encounters:  01/21/19 48.5 kg    Ideal Body Weight:  47.7 kg  BMI:  Body mass index is 20.2 kg/m.  Estimated Nutritional Needs:   Kcal:  1560 kcals   Protein:  70-90 g   Fluid:  >/= 1.5 L   Kerman Passey MS, RD, LDN, CNSC [phone] Pager  [phone] Weekend/On-Call Pager

## 2019-01-22 NOTE — Progress Notes (Signed)
NAME:  Delisa Finck, MRN:  [id number], DOB:  [birth year], LOS: 2 ADMISSION DATE:  01/20/2019, CONSULTATION DATE: 01/20/2019 REFERRING MD:  Bonner Puna, CHIEF COMPLAINT:  Sepsis  Brief History    Patient is a 33 year old white female with a history of IV drug abuse/polysubstance abuse (presented with urine drug screen positive for opiates and amphetamines ) and a previous history in June or July 2019 of inadequately treated endocarditis having signed out AMA at that time.  She had a tricuspid vegetation at that time.  She re-presented hypotensive and in respiratory distress requiring intubation at [facility].  CT scan of the chest was performed there showing bilateral nodular but in particular cavitary lesions consistent with septic emboli from tricuspid source.  She was given a dose of vancomycin and Zosyn and Teflaro at [facility] and transferred here.  On my evaluation the patient is sedated, on low-dose Levophed with a blood pressure of 100/80 intubated on PR CV with tidal volumes of 450 respiratory rate 18.  Initial blood gas shows a pH of 7.2 PCO2 of 45 adequate oxygenation.  Echocardiogram performed at [facility] is currently pending.  Previous blood cultures from June of last year were positive for MRSA.  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  On 14 ug levofed.  Discussed with family and made  it clear that she is almost certainly terminal based on sepsis, renal failure, septic emboli pancytopenia and DIC  They understand that if she does not have significant improvement in her renal function in the next 24-48 the discussion will need to be one of compassionate withdrawal of life support.  Consults:  nephrology  Procedures:  4/4 Intubation, R femoral line   Significant Diagnostic Tests:  Echocardiogram results pending CT scan of the chest MRI of the spine pending   Micro Data:  Blood cultures x2 results pending ID panel with MSSA   Antimicrobials:  Vancomycin Zosyn Teflaro  Interim history/subjective:   01/22/2019 - on vent 040% fio2, fent 2mcg , levophed 4mcg, Anuric. Perrenal  - not a candidate for CRRT. Worsening creat  Objective   Blood pressure 114/66, pulse (!) 126, temperature (!) 100.4 F (38 C), resp. rate (!) 25, height 5\' 1"  (1.549 m), weight 48.5 kg, SpO2 100 %.    Vent Mode: PRVC FiO2 (%):  [40 %-50 %] 40 % Set Rate:  [25 bmp] 25 bmp Vt Set:  [450 mL] 450 mL PEEP:  [5 cmH20] 5 cmH20 Plateau Pressure:  [13 cmH20-19 cmH20] 13 cmH20   Intake/Output Summary (Last 24 hours) at 01/22/2019 1156 Last data filed at 01/22/2019 0900 Gross per 24 hour  Intake 2344.43 ml  Output 355 ml  Net 1989.43 ml   Filed Weights   01/20/19 0130 01/20/19 1200 01/21/19 0600  Weight: 54.4 kg 45.8 kg 48.5 kg    Due to Pandemic situation - this exam is an inspection exam from outside or via  camera and with tactile part being shared findings with other care providers who have examined in last 60 minutes   General Appearance:  Looks criticall ill HEENT: looks intact Throat:  ETT TUBE - yes , OG tube - yes Neck:  No obvious pathology Lungs: ronchii per rn Ventilator   Synchrony -yes Heart:  S1 and S2 normal, no murmur, CVP - x  Pressors - levophed + Abdomen:  Not examined Genitalia / Rectal:  Not examined Extremities:  Extremities- intct Skin:  Intact in exposed areas .  Sacral area - not examined Neurologic:  Sedation - fent gtt -> RASS - -2 and occ +2   LABS    PULMONARY Recent Labs  Lab 01/20/19 0215 01/20/19 1224 01/20/19 1559  PHART  --  7.246* 7.270*  PCO2ART  --  43.4 38.0  PO2ART  --  248.0* 90.0  HCO3 19.5* 18.9* 17.4*  TCO2  --  20* 19*  O2SAT 60.7 100.0 96.0    CBC Recent Labs  Lab 01/21/19 0110 01/21/19 0517 01/22/19 0437  HGB 8.4* 8.3* 7.2*  HCT 27.0* 25.6* 23.2*  WBC 15.6* 13.9* 15.2*  PLT 11* 9* 7*    COAGULATION Recent Labs  Lab 01/20/19 0433  INR 1.7*    CARDIAC   Recent Labs  Lab 01/20/19 0535  TROPONINI 0.04*   No results for input(s): PROBNP in the last 168 hours.   CHEMISTRY Recent Labs  Lab 01/20/19 0157 01/20/19 1224 01/20/19 1338 01/20/19 1559 01/21/19 0517 01/22/19 0437  NA 130* 134* 134* 135 134* 136  K 5.0 4.9 5.2* 5.1 5.1 5.3*  CL 95*  --  103  --  108 103  CO2 16*  --  16*  --  14* 19*  GLUCOSE 85  --  79  --  78 72  BUN 117*  --  132*  --  129* 129*  CREATININE 7.12*  --  6.91*  --  6.72* 6.90*  CALCIUM 7.5*  --  7.2*  --  7.2* 7.0*   Estimated Creatinine Clearance: 8.9 mL/min (A) (by C-G formula based on SCr of 6.9 mg/dL (H)).   LIVER Recent Labs  Lab 01/20/19 0157 01/20/19 0433  AST 62*  --   ALT 22  --   ALKPHOS 167*  --   BILITOT 2.2*  --   PROT 5.7*  --   ALBUMIN 2.0*  --   INR  --  1.7*     INFECTIOUS Recent Labs  Lab 01/20/19 0157 01/20/19 0353 01/20/19 0433  LATICACIDVEN 2.1* 1.6  --   PROCALCITON  --   --  72.62     ENDOCRINE CBG (last 3)  Recent Labs    01/20/19 1044 01/20/19 1625 01/21/19 0120  GLUCAP 73 74 82         IMAGING x48h  - image(s) personally visualized  -   highlighted in bold US Renal  Result Date: 01/20/2019 CLINICAL DATA:  Acute renal failure. EXAM: RENAL / URINARY TRACT ULTRASOUND COMPLETE COMPARISON:  04/08/2018 FINDINGS: Right Kidney: Renal measurements: 11.6 x 4.5 x 6.1 cm = volume: 167 mL. Diffusely increased  renal parenchymal echogenicity, similar to prior exam. No mass or hydronephrosis visualized. Left Kidney: Renal measurements: 10.4 x 5.2 x 5.2 cm = volume: 148 mL. Diffusely increased renal parenchymal echogenicity. No mass or hydronephrosis visualized. Bladder: Appears normal for degree of bladder distention. Other: Marked splenomegaly again noted. IMPRESSION: Increased bilateral renal parenchymal echogenicity, consistent with chronic medical renal disease. No evidence of  renal mass or hydronephrosis. Marked splenomegaly, without significant change compared to prior exam. Electronically Signed   By: Earle Gell M.D.   On: 01/20/2019 13:59   Dg Abd Portable 1v  Result Date: 01/21/2019 CLINICAL DATA:  Orogastric tube placement. EXAM: PORTABLE ABDOMEN - 1 VIEW COMPARISON:  None. FINDINGS: Patient is rotated to the right. Orogastric tube tip overlies the right lower quadrant, likely in the distal stomach. A right femoral catheter is also seen in place. A paucity of bowel gas is seen, however there is no evidence of dilated bowel loops. Heterogeneous bilateral pulmonary airspace disease is noted. IMPRESSION: Orogastric tube tip overlies the right lower quadrant, likely in the distal stomach. Electronically Signed   By: Earle Gell M.D.   On: 01/21/2019 15:07   [place] Ekg Site Rite  Result Date: 01/20/2019 If Site Rite image not attached, placement could not be confirmed due to current cardiac rhythm.    Resolved Hospital Problem list   none  ASSESSMENT / PLAN:  PULMONARY A:  Acute resp failure due to MSSA septic shock in IVDA  01/22/2019 -> does not meet sbt criteria  P:   PRVC  CARDIAC A: Moderately sized vegetation on the tricuspid valve. - echo 01/20/2019 - recurrence  P: Not a surgical candidate  VASCULAR A:   Septic shock  P:  Levophed for map > 65   RENAL A:  Acute renal failure - not a cvvh candidate per renal P:  Supportive care   ELECTROLYTES A:  hyperkalemia P: Daily  lokelma   GASTROINTESTINAL A:   og tube  P:   tf  HEMATOLOGIC A:  Anemia critical illness   P:  - PRBC for hgb </= 6.9gm%    - exceptions are   -  if ACS susepcted/confirmed then transfuse for hgb </= 8.0gm%,  or    -  active bleeding with hemodynamic instability, then transfuse regardless of hemoglobin value   At at all times try to transfuse 1 unit prbc as possible with exception of active hemorrhage    INFECTIOUS A:   MSSA endocardiditis P:   Anti-infectives (From admission, onward)   Start     Dose/Rate Route Frequency Ordered Stop   01/20/19 2000  ceFAZolin (ANCEF) IVPB 1 g/50 mL premix     1 g 100 mL/hr over 30 Minutes Intravenous Every 24 hours 01/20/19 1831     01/20/19 1400  piperacillin-tazobactam (ZOSYN) IVPB 2.25 g  Status:  Discontinued    Note to Pharmacy:  Pharmacy to dose   2.25 g 66.7 mL/hr over 30 Minutes Intravenous Every 8 hours 01/20/19 1234 01/20/19 1239   01/20/19 1400  piperacillin-tazobactam (ZOSYN) IVPB 2.25 g  Status:  Discontinued     2.25 g 100 mL/hr over 30 Minutes Intravenous Every 8 hours 01/20/19 1239 01/20/19 1831   01/20/19 0545  ceftaroline (TEFLARO) injection SOLR 300 mg  Status:  Discontinued     300 mg Intravenous  Once 01/20/19 0534 01/20/19 0535   01/20/19 0545  ceftaroline (TEFLARO) 300 mg in sodium chloride 0.9 % 250 mL IVPB     300 mg 250 mL/hr over 60 Minutes Intravenous  Once 01/20/19 0539 01/20/19 0727   01/20/19 0430  vancomycin (VANCOCIN) IVPB 1000 mg/200 mL premix  Status:  Discontinued     1,000 mg 200 mL/hr over 60 Minutes Intravenous  Once 01/20/19 0348 01/20/19 0519   01/20/19 0400  piperacillin-tazobactam (ZOSYN) IVPB 3.375 g     3.375 g 100 mL/hr over 30  Minutes Intravenous  Once 01/20/19 0348 01/20/19 0423        ENDOCRINE A:   At risk hyperglycemia   P:   ssi  NEUROLOGIC A:   Agitated on wua - MRI pending P:   Cancel MRI brain/spine (prognosis poor)  GLOBAL No CPR as d/w mom     Best  practice:  Diet: npo start tF Pain/Anxiety/Delirium protocol (if indicated):Fentanyl VAP protocol (if indicated): probable mrsa septic emboli DVT prophylaxis: scd GI prophylaxis: protocol Glucose control: protocal Mobility: sedated Code Status: full Family Communication: mom and dscused - no cvvh, no cpr, no mri - continue full medical care otherwise. Explained grave prognosis (witness RN Everlene Other() Disposition: icu  ATTESTATION & SIGNATURE   The patient Jenel Gierke is critically ill with multiple organ systems failure and requires high complexity decision making for assessment and support, frequent evaluation and titration of therapies, application of advanced monitoring technologies and extensive interpretation of multiple databases.   Critical Care Time devoted to patient care services described in this note is  30  Minutes. This time reflects time of care of this signee Dr Brand Males. This critical care time does not reflect procedure time, or teaching time or supervisory time of PA/NP/Med student/Med Resident etc but could involve care discussion time     Dr. Brand Males, M.D., [facility].C.P Pulmonary and Critical Care Medicine Staff Physician [place] [facility] Pager: [phone], If no answer or between  15:00h - 7:00h: call [phone]  01/22/2019 11:56 AM

## 2019-01-23 ENCOUNTER — Inpatient Hospital Stay (HOSPITAL_COMMUNITY): Payer: Medicaid Other

## 2019-01-23 LAB — MAGNESIUM
Magnesium: 2.3 mg/dL (ref 1.7–2.4)
Magnesium: 2.3 mg/dL (ref 1.7–2.4)

## 2019-01-23 LAB — BASIC METABOLIC PANEL
Anion gap: 12 (ref 5–15)
BUN: 144 mg/dL — ABNORMAL HIGH (ref 6–20)
CO2: 27 mmol/L (ref 22–32)
Calcium: 6.1 mg/dL — CL (ref 8.9–10.3)
Chloride: 99 mmol/L (ref 98–111)
Creatinine, Ser: 7.28 mg/dL — ABNORMAL HIGH (ref 0.44–1.00)
GFR calc Af Amer: 8 mL/min — ABNORMAL LOW (ref >60)
GFR calc non Af Amer: 7 mL/min — ABNORMAL LOW (ref >60)
Glucose, Bld: 169 mg/dL — ABNORMAL HIGH (ref 70–99)
Potassium: 4.9 mmol/L (ref 3.5–5.1)
Sodium: 138 mmol/L (ref 135–145)

## 2019-01-23 LAB — PHOSPHORUS
Phosphorus: 5.3 mg/dL — ABNORMAL HIGH (ref 2.5–4.6)
Phosphorus: 5.2 mg/dL — ABNORMAL HIGH (ref 2.5–4.6)

## 2019-01-23 LAB — CBC
HCT: 19.7 % — ABNORMAL LOW (ref 36.0–46.0)
Hemoglobin: 6.8 g/dL — CL (ref 12.0–15.0)
MCH: 26.8 pg (ref 26.0–34.0)
MCHC: 34.5 g/dL (ref 30.0–36.0)
MCV: 77.6 fL — ABNORMAL LOW (ref 80.0–100.0)
Platelets: 11 10*3/uL — CL (ref 150–400)
RBC: 2.54 MIL/uL — ABNORMAL LOW (ref 3.87–5.11)
RDW: 15.9 % — ABNORMAL HIGH (ref 11.5–15.5)
WBC: 12.7 10*3/uL — ABNORMAL HIGH (ref 4.0–10.5)
nRBC: 0 % (ref 0.0–0.2)

## 2019-01-23 LAB — GLUCOSE, CAPILLARY
Glucose-Capillary: 125 mg/dL — ABNORMAL HIGH (ref 70–99)
Glucose-Capillary: 132 mg/dL — ABNORMAL HIGH (ref 70–99)
Glucose-Capillary: 138 mg/dL — ABNORMAL HIGH (ref 70–99)
Glucose-Capillary: 143 mg/dL — ABNORMAL HIGH (ref 70–99)
Glucose-Capillary: 152 mg/dL — ABNORMAL HIGH (ref 70–99)
Glucose-Capillary: 156 mg/dL — ABNORMAL HIGH (ref 70–99)
Glucose-Capillary: 159 mg/dL — ABNORMAL HIGH (ref 70–99)

## 2019-01-23 LAB — PREPARE RBC (CROSSMATCH)

## 2019-01-23 MED ORDER — METHADONE HCL 5 MG PO TABS
20.0000 mg | ORAL_TABLET | Freq: Every day | ORAL | Status: DC
  Administered 2019-01-23: 20 mg
  Filled 2019-01-23: qty 4

## 2019-01-23 MED ORDER — CALCIUM GLUCONATE-NACL 1-0.675 GM/50ML-% IV SOLN
1.0000 g | Freq: Once | INTRAVENOUS | Status: AC
  Administered 2019-01-23: 1000 mg via INTRAVENOUS
  Filled 2019-01-23: qty 50

## 2019-01-23 MED ORDER — SODIUM CHLORIDE 0.9% IV SOLUTION
Freq: Once | INTRAVENOUS | Status: AC
  Administered 2019-01-23: 07:00:00 via INTRAVENOUS

## 2019-01-23 NOTE — Progress Notes (Signed)
NAME:  Molly [name], MRN:  [id number], DOB:  Nov 21, 1985, LOS: 3 ADMISSION DATE:  01/20/2019, CONSULTATION DATE: 01/20/2019 REFERRING MD:  Bonner Puna, CHIEF COMPLAINT:  Sepsis  Brief History    Patient is a 33 year old white female with a history of IV drug abuse/polysubstance abuse (presented with urine drug screen positive for opiates and amphetamines ) and a previous history in June or July 2019 of inadequately treated MRSA endocarditis having signed out AMA at that time.  She had a tricuspid vegetation at that time.  She re-presented hypotensive and in respiratory distress requiring intubation at [facility].   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  On 14 ug levofed.  Discussed with family and made it clear that she is almost certainly terminal based on sepsis, renal failure, septic emboli pancytopenia and DIC  They understand that if she does not have significant improvement in her renal function in the next 24-48 the discussion will need to be one of compassionate withdrawal of life support.  Consults:  nephrology  Procedures:  4/4 ETT >>  R femoral line 4/4 >>   Significant Diagnostic Tests:  Echocardiogram 4/4 >> TV vegetation measures 1.5 mm x 0.6 mm., severe TR CT scan of the  chest4/4 Bilateral pulmonary nodules, many of which are peripheral and cavitated.  MRI of the spine pending   Micro Data:  Blood cultures x2 4/4 >>MSSA   Antimicrobials:  Vancomycin Zosyn Teflaro >> stopped ANcef 4/4 >>  Interim history/subjective:   Remains critically ill on 12 mics of Levophed Sedated on fentanyl drip. Remains on bicarbonate drip Urine output is poor   Objective   Blood pressure 116/88, pulse 89, temperature 99.9 F (37.7 C), resp. rate (!) 26, height 5\' 1"  (1.549 m), weight 48.5 kg, SpO2 100 %.    Vent Mode: PRVC FiO2 (%):  [40 %] 40 % Set Rate:  [25 bmp] 25 bmp Vt Set:  [450 mL] 450 mL PEEP:  [5 cmH20] 5 cmH20 Plateau Pressure:  [13 cmH20-20 cmH20] 20 cmH20   Intake/Output Summary (Last 24 hours) at 01/23/2019 0935 Last data filed at 01/23/2019 0800 Gross per 24 hour  Intake 1130.59 ml  Output 472 ml  Net 658.59 ml   Filed Weights   01/20/19 0130 01/20/19 1200 01/21/19 0600  Weight: 54.4 kg 45.8 kg 48.5 kg      Acutely ill-appearing young woman, sedated on fentanyl, orally intubated Unresponsive, mild pallor, no icterus, no JVD Mucosa appears dry Clear breath sounds bilateral S1-S2 tacky, regular sinus on monitor Soft nontender abdomen Pupils 3 mm bilaterally equally reactive to light, unresponsive, RA SS -3, who was following commands per RN Skin with multiple abrasions  Chest x-ray from 4/4 personally reviewed shows ET tube high up, bilateral infiltrates   Resolved Hospital Problem list   none  ASSESSMENT / PLAN:  PULMONARY A:  Acute resp failure due to MSSA septic shock in IVDA  P:   Not ready for spontaneous breathing trials due to shock  CARDIAC A: Septic shock/MRSA bacteremia Infective endocarditis/tricuspid valve P: Not a surgical candidate Levophed for map > 65   RENAL A:  AKI - worsening,not a cvvh candidate per renal Hyperkalemia improved P:  Supportive care Discontinue bicarbonate drip Daily lokelma    GASTROINTESTINAL A:   og tube  P:   Ct TFs  HEMATOLOGIC A:  Anemia critical illness  P:  - PRBC for hgb </= 6.9gm%       INFECTIOUS A:   MSSA endocardiditis P:  Ct ANcef Will need repeat blood cultures to establish clearance  ENDOCRINE A:   At risk hyperglycemia   P:   ssi  NEUROLOGIC A:   Intermittent agitation, head CT negative P:    MRI brain/spine several due to poor prognosis Continue fentanyl drip with goal RA SS 0, may add methadone to try to minimize use of drip   GLOBAL No CPR as d/w mom     Best practice:  Diet: npo start tF Pain/Anxiety/Delirium protocol (if indicated):Fentanyl VAP protocol (if indicated): probable mrsa septic emboli DVT prophylaxis: scd GI prophylaxis: protocol Glucose control: protocal Mobility: sedated Code Status: Limited code Family Communication:  Per discussion 4/6- no cvvh, no cpr, no mri - continue full medical care otherwise.  Disposition: icu    The patient is critically ill with multiple organ systems failure and requires high complexity decision making for assessment and support, frequent evaluation and titration of therapies, application of advanced monitoring technologies and extensive interpretation of multiple databases. Critical Care Time devoted to patient care services described in this note independent of APP/resident  time is 35 minutes.   Kara Mead MD. Shade Flood. [place] [facility] Pager [phone] If no response call [phone]    01/23/2019 9:35 AM

## 2019-01-23 NOTE — Progress Notes (Signed)
eLink Physician-Brief Progress Note Patient Name: Molly [name] DOB: June 11, 1986 MRN: [id number]   Date of Service  01/23/2019  HPI/Events of Note  Multiple issues: 1. Ca++ = 6.1 and 2. Hgb = 6.8.   eICU Interventions  Will order: 1. Replace CA++. 2. Transfuse 1 unit PRBC now.      Intervention Category Major Interventions: Other:;Electrolyte abnormality - evaluation and management  Sommer,Steven Eugene 01/23/2019, 6:28 AM

## 2019-01-23 NOTE — Progress Notes (Signed)
Endocarditis Evaluation   Infective endocarditis (IE) is associated with a high rate of complications and mortality.  Specific aspects of clinical management are critical to optimizing the outcome of patients with IE. Therefore, the Pharmacy Residency Program at [facility] has initiated an evidence-based consult aimed at improving the management of IE at [facility].     Comments  Consult to ID Outpatient follow-up appointment to be made prior to discharge.  Utilization of ID recommended antibiotic therapy   Surgery Evaluation Cardiothoracic surgery consult if the following indications are present:  - Heart failure associated to valve dysfunction - Endocarditis complicated by heart block or aortic abscess - Left-sided endocarditis caused by S. aureus, fungal, or multi-drug resistant organisms - Left-sided endocarditis with severe valve dysfunction - Large vegetation (>1 cm) on aortic or mitral valve - Persistence of infection 5-7 days after initiation of appropriate antibiotic therapy - Recurrent emboli and persistent vegetations despite appropriate antibiotic therapy - Any history with left-sided embolization with persistent vegetations on TEE  Consult electrophysiologist if presence of implanted cardiac device (pacemaker, ICD)   Consult to cardiology if left ventricular ejection fraction is <40% Evaluation for appropriate heart failure medications upon discharge: - ACE/ARB - Diuretic - Beta blocker  Outpatient follow-up appointment to be made prior to discharge.   Based on the pharmacist's evaluation of this patient, the following are recommended to complete a thorough evaluation and care plan to reflect guideline-recommended therapy.  1. ID follow up appointment to be made prior to discharge  Vertis Kelch, PharmD PGY1 Pharmacy Resident Phone [phone] 01/23/2019       4:51 PM

## 2019-01-24 ENCOUNTER — Inpatient Hospital Stay (HOSPITAL_COMMUNITY): Payer: Medicaid Other

## 2019-01-24 DIAGNOSIS — G9340 Encephalopathy, unspecified: Secondary | ICD-10-CM

## 2019-01-24 LAB — GLUCOSE, CAPILLARY
Glucose-Capillary: 140 mg/dL — ABNORMAL HIGH (ref 70–99)
Glucose-Capillary: 52 mg/dL — ABNORMAL LOW (ref 70–99)
Glucose-Capillary: 142 mg/dL — ABNORMAL HIGH (ref 70–99)
Glucose-Capillary: 136 mg/dL — ABNORMAL HIGH (ref 70–99)
Glucose-Capillary: 135 mg/dL — ABNORMAL HIGH (ref 70–99)
Glucose-Capillary: 137 mg/dL — ABNORMAL HIGH (ref 70–99)
Glucose-Capillary: 143 mg/dL — ABNORMAL HIGH (ref 70–99)

## 2019-01-24 LAB — PREPARE RBC (CROSSMATCH)

## 2019-01-24 LAB — BASIC METABOLIC PANEL
Anion gap: 14 (ref 5–15)
BUN: 156 mg/dL — ABNORMAL HIGH (ref 6–20)
CO2: 24 mmol/L (ref 22–32)
Calcium: 6.3 mg/dL — CL (ref 8.9–10.3)
Chloride: 99 mmol/L (ref 98–111)
Creatinine, Ser: 7.1 mg/dL — ABNORMAL HIGH (ref 0.44–1.00)
GFR calc Af Amer: 8 mL/min — ABNORMAL LOW (ref >60)
GFR calc non Af Amer: 7 mL/min — ABNORMAL LOW (ref >60)
Glucose, Bld: 147 mg/dL — ABNORMAL HIGH (ref 70–99)
Potassium: 4.6 mmol/L (ref 3.5–5.1)
Sodium: 137 mmol/L (ref 135–145)

## 2019-01-24 LAB — CBC
HCT: 19.9 % — ABNORMAL LOW (ref 36.0–46.0)
Hemoglobin: 6.8 g/dL — CL (ref 12.0–15.0)
MCH: 27.3 pg (ref 26.0–34.0)
MCHC: 34.2 g/dL (ref 30.0–36.0)
MCV: 79.9 fL — ABNORMAL LOW (ref 80.0–100.0)
Platelets: 11 10*3/uL — CL (ref 150–400)
RBC: 2.49 MIL/uL — ABNORMAL LOW (ref 3.87–5.11)
RDW: 15.1 % (ref 11.5–15.5)
WBC: 5 10*3/uL (ref 4.0–10.5)
nRBC: 0 % (ref 0.0–0.2)

## 2019-01-24 LAB — TYPE AND SCREEN
ABO/RH(D): O NEG
Antibody Screen: NEGATIVE
Unit division: 0
Unit division: 0

## 2019-01-24 LAB — BPAM RBC
Blood Product Expiration Date: 202004102359
Blood Product Expiration Date: 202004112359
ISSUE DATE / TIME: 202004041852
ISSUE DATE / TIME: 202004070700
Unit Type and Rh: 9500
Unit Type and Rh: 9500

## 2019-01-24 MED ORDER — CALCIUM GLUCONATE-NACL 1-0.675 GM/50ML-% IV SOLN
1.0000 g | Freq: Once | INTRAVENOUS | Status: AC
  Administered 2019-01-24: 1000 mg via INTRAVENOUS
  Filled 2019-01-24: qty 50

## 2019-01-24 MED ORDER — SODIUM CHLORIDE 0.9% IV SOLUTION
Freq: Once | INTRAVENOUS | Status: AC
  Administered 2019-01-27: 17:00:00 via INTRAVENOUS

## 2019-01-24 MED ORDER — METHADONE HCL 5 MG PO TABS
20.0000 mg | ORAL_TABLET | Freq: Two times a day (BID) | ORAL | Status: DC
  Administered 2019-01-24 (×2): 20 mg
  Filled 2019-01-24 (×2): qty 4

## 2019-01-24 MED ORDER — FENTANYL CITRATE (PF) 100 MCG/2ML IJ SOLN: 100.0000 ug | INTRAMUSCULAR | Status: DC | PRN

## 2019-01-24 MED ORDER — PANTOPRAZOLE SODIUM 40 MG PO PACK
40.0000 mg | PACK | Freq: Every day | ORAL | Status: DC
  Administered 2019-01-24 – 2019-02-26 (×34): 40 mg
  Filled 2019-01-24 (×34): qty 20

## 2019-01-24 MED ORDER — SODIUM CHLORIDE 0.9% IV SOLUTION
Freq: Once | INTRAVENOUS | Status: AC
  Administered 2019-01-24: 06:00:00 via INTRAVENOUS

## 2019-01-24 NOTE — Progress Notes (Signed)
NAME:  Molly [name], MRN:  [id number], DOB:  09-21-1986, LOS: 4 ADMISSION DATE:  01/20/2019, CONSULTATION DATE: 01/20/2019 REFERRING MD:  Bonner Puna, CHIEF COMPLAINT:  Sepsis  Brief History    33 year old white female with a history of IV drug abuse/polysubstance abuse (presented with urine drug screen positive for opiates and amphetamines ) and a previous history in June or July 2019 of inadequately treated MRSA endocarditis having signed out AMA at that time.  She had a tricuspid vegetation at that time.   She presented 4/4 hypotensive and in respiratory distress requiring intubation at [facility].   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, history of sacral osteomyelitis, history of pancytopenia, history of MRSA bacteremia, history of chronic kidney disease stage III with a GFR estimated between 50 and 60, history of recurrent skin abscesses, history of abscess in epidural space  Significant Hospital Events   01/20/2019 - Intubation and transfer to [facility] for ICU therapy  4/4 Patient is sedated intubated on Levophed and fentanyl for sedation  4/5  Discussed with family and made it clear that she is almost certainly terminal based on sepsis, renal failure, septic emboli pancytopenia and DIC  They understand that if she does not have significant improvement in her renal function in the next 24-48 the discussion will need to be one of compassionate withdrawal of life support.  Consults:  nephrology  Procedures:  4/4 ETT >>  R femoral line 4/4 >>   Significant Diagnostic Tests:  Echocardiogram 4/4 >> TV vegetation measures 1.5 mm x 0.6 mm., severe TR CT scan of the chest4/4 Bilateral pulmonary nodules, many of which are peripheral and cavitated.    Micro Data:  Blood cultures x2 4/4 >>MSSA   [facility] 4/8 >>  Antimicrobials:  Vancomycin Zosyn Teflaro >> stopped ANcef 4/4 >>  Interim history/subjective:  She is critically ill, intubated Off Levophed now Urine output remains poor Off fentanyl drip this morning   Objective   Blood pressure 115/78, pulse 90, temperature 98.6 F (37 C), resp. rate (!) 25, height 5\' 1"  (1.549 m), weight 50 kg, SpO2 100 %.    Vent Mode: PRVC FiO2 (%):  [30 %-40 %] 30 % Set Rate:  [25 bmp] 25 bmp Vt Set:  [450 mL] 450 mL PEEP:  [5 cmH20] 5 cmH20 Plateau Pressure:  [16 cmH20-19 cmH20] 19 cmH20   Intake/Output Summary (Last 24 hours) at 01/24/2019 0951 Last data filed at 01/24/2019 0841 Gross per 24 hour  Intake 1574.01 ml  Output 211 ml  Net 1363.01 ml   Filed Weights   01/20/19 1200 01/21/19 0600 01/24/19 0404  Weight: 45.8 kg 48.5 kg 50 kg      Acutely ill-appearing young woman, calm, orally intubated mild pallor, no icterus, no JVD Mucosa appears dry Clear breath sounds bilateral S1-S2 tacky, regular sinus on monitor Soft nontender abdomen Pupils 3 mm bilaterally equally reactive to light, appears calm off fentanyl drip, RA SS 0, follows one-step commands Skin with multiple abrasions, left toe ecchymotic area   Chest x-ray 4/8 personally reviewed shows ET tube in better position, slight worsening of left airspace disease  Resolved Hospital Problem list     ASSESSMENT / PLAN:  PULMONARY A:  Acute resp failure due to MSSA septic shock in IVDA  P:   Start spontaneous breathing trials  CARDIAC A: Septic shock/MRSA bacteremia Infective endocarditis/tricuspid valve P: Not a surgical candidate Levophed for map > 65   RENAL A:  AKI -not a cvvh candidate per renal Hyperkalemia improved P:  Creatinine has plateaued and hopefully can expect some renal recovery here   GASTROINTESTINAL A:   og tube  P:   Ct TFs  HEMATOLOGIC A:  Anemia critical illness S/p 1 U PRBC 4/7, 4/8 Severe thrombocytopenia  P:  -  PRBC for hgb </= 6.9gm%   And screws for platelets less than 10     INFECTIOUS A:   MSSA endocardiditis P:  Ct ANcef  repeat blood cultures to establish clearance  ENDOCRINE A:   At risk hyperglycemia   P:   ssi  NEUROLOGIC A:   Intermittent agitation, head CT negative P:    MRI brain/spine deferred due to poor prognosis Increase methadone 20 twice daily and changed to intermittent fentanyl, goal RA SS 0   GLOBAL No CPR as d/w mom     Best practice:  Diet: npo start tF Pain/Anxiety/Delirium protocol (if indicated):Fentanyl VAP protocol (if indicated): probable mrsa septic emboli DVT prophylaxis: scd GI prophylaxis: protocol Glucose control: protocal Mobility: sedated Code Status: Limited code Family Communication:  Per discussion 4/6- no cvvh, no cpr, no mri - continue full medical care otherwise.  Disposition: icu   The patient is critically ill with multiple organ systems failure and requires high complexity decision making for assessment and support, frequent evaluation and titration of therapies, application of advanced monitoring technologies and extensive interpretation of multiple databases. Critical Care Time devoted to patient care services described in this note independent of APP/resident  time is 33 minutes.    Kara Mead MD. Shade Flood. [place] [facility] Pager [phone] If no response call [phone]    01/24/2019 9:51 AM

## 2019-01-24 NOTE — Progress Notes (Signed)
[facility] Progress Note Patient Name: Molly [name] DOB: [birth year] MRN: [id number]   Date of Service  01/24/2019  HPI/Events of Note  Multiple issues: 1. Hgb = 6.8 and Ca++ = 6.3.   eICU Interventions  Will order: 1. Transfuse 1 unit PRBC now. 2. Replace Ca++.      Intervention Category Major Interventions: Electrolyte abnormality - evaluation and management;Other:  Ellyson Rarick Cornelia Copa 01/24/2019, 5:28 AM

## 2019-01-24 NOTE — Progress Notes (Signed)
Dear Doctor Chase Caller: This patient has been identified as a candidate for PICC for the following reason (s): IV therapy over 48 hours and poor veins/poor circulatory system (CHF, COPD, emphysema, diabetes, steroid use, IV drug abuse, etc.) If you agree, please write an order for the indicated device. For any questions contact the Vascular Access Team at [phone] if no answer, please leave a message.  Thank you for supporting the early vascular access assessment program.

## 2019-01-24 NOTE — Progress Notes (Signed)
CRITICAL VALUE ALERT  Critical Value:  Ca 6.3 and Hemoglobin 6.8  Date & Time Notied:  01/24/2019  5:22 AM  Provider Notified: E-Link

## 2019-01-24 NOTE — Progress Notes (Signed)
eLink Physician-Brief Progress Note Patient Name: Molly [name] DOB: 01-Aug-1986 MRN: [id number]   Date of Service  01/24/2019  HPI/Events of Note  Agitation - Request to renew bilateral soft wrist restraints.   eICU Interventions  Will renew bilateral soft wrist restraints.      Intervention Category Major Interventions: Delirium, psychosis, severe agitation - evaluation and management  [name] 01/24/2019, 2:01 AM

## 2019-01-24 NOTE — Progress Notes (Signed)
Facilitated video phone call with pt mother, Jan, via [place].

## 2019-01-25 DIAGNOSIS — J9600 Acute respiratory failure, unspecified whether with hypoxia or hypercapnia: Secondary | ICD-10-CM

## 2019-01-25 LAB — BPAM RBC
Blood Product Expiration Date: 202004112359
ISSUE DATE / TIME: 202004080709
Unit Type and Rh: 9500

## 2019-01-25 LAB — TYPE AND SCREEN
ABO/RH(D): O NEG
Antibody Screen: NEGATIVE
Unit division: 0

## 2019-01-25 LAB — GLUCOSE, CAPILLARY
Glucose-Capillary: 131 mg/dL — ABNORMAL HIGH (ref 70–99)
Glucose-Capillary: 127 mg/dL — ABNORMAL HIGH (ref 70–99)
Glucose-Capillary: 124 mg/dL — ABNORMAL HIGH (ref 70–99)
Glucose-Capillary: 127 mg/dL — ABNORMAL HIGH (ref 70–99)

## 2019-01-25 LAB — CBC
HCT: 23.1 % — ABNORMAL LOW (ref 36.0–46.0)
Hemoglobin: 7.8 g/dL — ABNORMAL LOW (ref 12.0–15.0)
MCH: 27.9 pg (ref 26.0–34.0)
MCHC: 33.8 g/dL (ref 30.0–36.0)
MCV: 82.5 fL (ref 80.0–100.0)
Platelets: 13 10*3/uL — CL (ref 150–400)
RBC: 2.8 MIL/uL — ABNORMAL LOW (ref 3.87–5.11)
RDW: 15 % (ref 11.5–15.5)
WBC: 4.9 10*3/uL (ref 4.0–10.5)
nRBC: 0 % (ref 0.0–0.2)

## 2019-01-25 LAB — BASIC METABOLIC PANEL
Anion gap: 17 — ABNORMAL HIGH (ref 5–15)
BUN: 155 mg/dL — ABNORMAL HIGH (ref 6–20)
CO2: 21 mmol/L — ABNORMAL LOW (ref 22–32)
Calcium: 6.5 mg/dL — ABNORMAL LOW (ref 8.9–10.3)
Chloride: 100 mmol/L (ref 98–111)
Creatinine, Ser: 6.59 mg/dL — ABNORMAL HIGH (ref 0.44–1.00)
GFR calc Af Amer: 9 mL/min — ABNORMAL LOW (ref >60)
GFR calc non Af Amer: 8 mL/min — ABNORMAL LOW (ref >60)
Glucose, Bld: 134 mg/dL — ABNORMAL HIGH (ref 70–99)
Potassium: 4.6 mmol/L (ref 3.5–5.1)
Sodium: 138 mmol/L (ref 135–145)

## 2019-01-25 MED ORDER — METHADONE HCL 5 MG PO TABS
20.0000 mg | ORAL_TABLET | Freq: Every day | ORAL | Status: DC
  Administered 2019-01-25 – 2019-01-29 (×5): 20 mg
  Filled 2019-01-25 (×5): qty 4

## 2019-01-25 MED ORDER — FENTANYL CITRATE (PF) 100 MCG/2ML IJ SOLN: 50.0000 ug | INTRAMUSCULAR | Status: DC | PRN

## 2019-01-25 MED ORDER — CHLORHEXIDINE GLUCONATE CLOTH 2 % EX PADS
6.0000 | MEDICATED_PAD | Freq: Every day | CUTANEOUS | Status: DC
  Administered 2019-01-25 – 2019-02-12 (×14): 6 via TOPICAL

## 2019-01-25 MED ORDER — INFLUENZA VAC SPLIT QUAD 0.5 ML IM SUSY
0.5000 mL | PREFILLED_SYRINGE | INTRAMUSCULAR | Status: AC
  Administered 2019-01-26: 11:00:00 0.5 mL via INTRAMUSCULAR
  Filled 2019-01-25: qty 0.5

## 2019-01-25 NOTE — Progress Notes (Signed)
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 ID has been following, and she is on day 6 of cefazolin.  Her prognosis has been poor, but kidney function appears to be slightly improved today, and she was able to come off of vasopressors yesterday. Her femoral line was also removed and repeat blood cultures drawn afterward.  - will follow blood cultures  - she will need six week antibiotic therapy after date of negative blood cultures    Principal Problem:   Sepsis with multiple organ dysfunction (MOD) (HCC) Active Problems:   Intravenous drug abuse, continuous (HCC)   Chronic anemia   Polysubstance abuse ([place])   Acute kidney failure ([place])   MSSA bacteremia   Acute septic pulmonary embolism with acute cor pulmonale (June 2019)   Acute encephalopathy   Thrombocytopenia (HCC)   Sepsis with disseminated intravascular coagulopathy (DIC) ([place])   Sepsis ([place])   Acute cystitis with hematuria   DIC (disseminated intravascular coagulation) ([place])   Multifocal pneumonia   Scheduled Meds: . sodium chloride   Intravenous Once  . chlorhexidine gluconate (MEDLINE KIT)  15 mL Mouth Rinse BID  . mouth rinse  15 mL Mouth Rinse 10 times per day  . methadone  20 mg Per Tube Q12H  . pantoprazole sodium  40 mg Per Tube Daily   Continuous Infusions: .  ceFAZolin (ANCEF) IV Stopped (01/24/19 2042)  . feeding supplement (OSMOLITE 1.2 CAL) 1,000 mL (01/25/19 0232)  . norepinephrine (LEVOPHED) Adult infusion Stopped (01/24/19 0605)   PRN Meds:.acetaminophen **OR** acetaminophen, fentaNYL (SUBLIMAZE) injection, midazolam, [DISCONTINUED] ondansetron **OR** ondansetron (ZOFRAN) IV   SUBJECTIVE: She is still  intubated but off pressors and sedation and is following commands. She nodded her head yes when asked if she was feeling ok and no when asked if she was having any pain.   Review of Systems: Review of Systems  All other systems reviewed and are negative.   Allergies  Allergen Reactions  . Vancomycin Other (See Comments)    Possible contributor to AKI and thrombocytopenia    OBJECTIVE: Vitals:   01/25/19 0624 01/25/19 0630 01/25/19 0700 01/25/19 0730  BP: 99/72 98/75 100/77 102/71  Pulse:   92 91  Resp:   20 20  Temp:   98.6 F (37 C) 98.4 F (36.9 C)  TempSrc:      SpO2:   100% 100%  Weight:      Height:       Body mass index is 22.87 kg/m.  Physical Exam Constitution: NAD, intubated, not sedated HENT: healing excoriations on forehead  Cardio: LUSB III/VI systolic murmur MSK: moving all extremities, no LE edema  Neuro: following commands, answering questions with nodding  Skin: LE purpura decreased    Lab Results Lab Results  Component Value Date   WBC 4.9 01/25/2019   HGB 7.8 (L) 01/25/2019   HCT 23.1 (L) 01/25/2019   MCV 82.5 01/25/2019   PLT 13 (LL) 01/25/2019    Lab Results  Component Value Date   CREATININE 6.59 (H) 01/25/2019   BUN 155 (H) 01/25/2019   NA 138 01/25/2019   K 4.6 01/25/2019   CL 100 01/25/2019   CO2 21 (L) 01/25/2019  Lab Results  Component Value Date   ALT 22 01/20/2019   AST 62 (H) 01/20/2019   ALKPHOS 167 (H) 01/20/2019   BILITOT 2.2 (H) 01/20/2019     Microbiology: Recent Results (from the past 240 hour(s))  Blood culture (routine x 2)     Status: Abnormal   Collection Time: 01/20/19  1:57 AM  Result Value Ref Range Status   Specimen Description   Final    BLOOD LEFT ANTECUBITAL Performed at [facility] [address], [place], [place] [address]    Special Requests   Final    BOTTLES DRAWN AEROBIC AND ANAEROBIC Blood Culture adequate volume Performed at [facility]  [address] [place], [place] [address]    Culture  Setup Time   Final    GRAM POSITIVE COCCI IN BOTH AEROBIC AND ANAEROBIC BOTTLES CRITICAL RESULT CALLED TO, READ BACK BY AND VERIFIED WITH: PHARMD P DANG [id number] AT 19 BY CM    Culture (A)  Final    STAPHYLOCOCCUS AUREUS SUSCEPTIBILITIES PERFORMED ON PREVIOUS CULTURE WITHIN THE LAST 5 DAYS. Performed at [facility], [place] [address], [place], [place] [address]    Report Status 01/22/2019 FINAL  Final  Blood culture (routine x 2)     Status: Abnormal   Collection Time: 01/20/19  2:02 AM  Result Value Ref Range Status   Specimen Description   Final    BLOOD RIGHT ANTECUBITAL Performed at [facility] [address] [place], [place] [address]    Special Requests   Final    BOTTLES DRAWN AEROBIC AND ANAEROBIC Blood Culture adequate volume Performed at [facility] [address] [place], [place] [address]    Culture  Setup Time   Final    GRAM POSITIVE COCCI IN BOTH AEROBIC AND ANAEROBIC BOTTLES CRITICAL RESULT CALLED TO, READ BACK BY AND VERIFIED WITH: PHARMD P DANG [id number] AT 1426 BY CM Performed at [facility], [place] [address], [place], [place] [address]    Culture STAPHYLOCOCCUS AUREUS (A)  Final   Report Status 01/22/2019 FINAL  Final   Organism ID, Bacteria STAPHYLOCOCCUS AUREUS  Final      Susceptibility   Staphylococcus aureus - MIC*    CIPROFLOXACIN <=0.5 SENSITIVE Sensitive     ERYTHROMYCIN <=0.25 SENSITIVE Sensitive     GENTAMICIN <=0.5 SENSITIVE Sensitive     OXACILLIN <=0.25 SENSITIVE Sensitive     TETRACYCLINE <=1 SENSITIVE Sensitive     VANCOMYCIN <=0.5 SENSITIVE Sensitive     TRIMETH/SULFA <=10 SENSITIVE Sensitive     CLINDAMYCIN <=0.25 SENSITIVE Sensitive     RIFAMPIN <=0.5 SENSITIVE Sensitive     Inducible Clindamycin NEGATIVE Sensitive     * STAPHYLOCOCCUS AUREUS  Blood Culture ID Panel (Reflexed)     Status: Abnormal   Collection Time: 01/20/19  2:02 AM   Result Value Ref Range Status   Enterococcus species NOT DETECTED NOT DETECTED Final   Listeria monocytogenes NOT DETECTED NOT DETECTED Final   Staphylococcus species DETECTED (A) NOT DETECTED Final    Comment: CRITICAL RESULT CALLED TO, READ BACK BY AND VERIFIED WITH: PHARMD P DANG [id number] AT 1630 BY CM    Staphylococcus aureus (BCID) DETECTED (A) NOT DETECTED Final    Comment: Methicillin (oxacillin) susceptible Staphylococcus aureus (MSSA). Preferred therapy is anti staphylococcal beta lactam antibiotic (Cefazolin or Nafcillin), unless clinically contraindicated. CRITICAL RESULT CALLED TO, READ BACK BY AND VERIFIED WITH: [place] [id number] AT 3903  BY CM    Methicillin resistance NOT DETECTED NOT DETECTED Final   Streptococcus species NOT DETECTED NOT DETECTED Final   Streptococcus agalactiae NOT DETECTED NOT DETECTED Final   Streptococcus pneumoniae NOT DETECTED NOT DETECTED Final   Streptococcus pyogenes NOT DETECTED NOT DETECTED Final   Acinetobacter baumannii NOT DETECTED NOT DETECTED Final   Enterobacteriaceae species NOT DETECTED NOT DETECTED Final   Enterobacter cloacae complex NOT DETECTED NOT DETECTED Final   Escherichia coli NOT DETECTED NOT DETECTED Final   Klebsiella oxytoca NOT DETECTED NOT DETECTED Final   Klebsiella pneumoniae NOT DETECTED NOT DETECTED Final   Proteus species NOT DETECTED NOT DETECTED Final   Serratia marcescens NOT DETECTED NOT DETECTED Final   Haemophilus influenzae NOT DETECTED NOT DETECTED Final   Neisseria meningitidis NOT DETECTED NOT DETECTED Final   Pseudomonas aeruginosa NOT DETECTED NOT DETECTED Final   Candida albicans NOT DETECTED NOT DETECTED Final   Candida glabrata NOT DETECTED NOT DETECTED Final   Candida krusei NOT DETECTED NOT DETECTED Final   Candida parapsilosis NOT DETECTED NOT DETECTED Final   Candida tropicalis NOT DETECTED NOT DETECTED Final    Comment: Performed at [facility], [place] [address] [place], [place]  [address]  Urine culture     Status: Abnormal   Collection Time: 01/20/19  2:52 AM  Result Value Ref Range Status   Specimen Description   Final    URINE, RANDOM Performed at [facility] [address], [place], [place] [address]    Special Requests   Final    NONE Performed at [facility], [place] [address] [place], [place] [address]    Culture >=100,000 COLONIES/mL STAPHYLOCOCCUS AUREUS (A)  Final   Report Status 01/22/2019 FINAL  Final   Organism ID, Bacteria STAPHYLOCOCCUS AUREUS (A)  Final      Susceptibility   Staphylococcus aureus - MIC*    CIPROFLOXACIN <=0.5 SENSITIVE Sensitive     GENTAMICIN <=0.5 SENSITIVE Sensitive     NITROFURANTOIN <=16 SENSITIVE Sensitive     OXACILLIN <=0.25 SENSITIVE Sensitive     TETRACYCLINE <=1 SENSITIVE Sensitive     VANCOMYCIN <=0.5 SENSITIVE Sensitive     TRIMETH/SULFA <=10 SENSITIVE Sensitive     CLINDAMYCIN <=0.25 SENSITIVE Sensitive     RIFAMPIN <=0.5 SENSITIVE Sensitive     Inducible Clindamycin NEGATIVE Sensitive     * >=100,000 COLONIES/mL STAPHYLOCOCCUS AUREUS  MRSA PCR Screening     Status: None   Collection Time: 01/20/19  2:55 PM  Result Value Ref Range Status   MRSA by PCR NEGATIVE NEGATIVE Final    Comment:        The GeneXpert MRSA Assay (FDA approved for NASAL specimens only), is one component of a comprehensive MRSA colonization surveillance program. It is not intended to diagnose MRSA infection nor to guide or monitor treatment for MRSA infections. Performed at [facility], [place] [address] [place], [place] [address]   Culture, blood (Routine X 2) w Reflex to ID Panel     Status: None (Preliminary result)   Collection Time: 01/24/19 10:52 AM  Result Value Ref Range Status   Specimen Description BLOOD LEFT ARM  Final   Special Requests   Final    BOTTLES DRAWN AEROBIC ONLY Blood Culture adequate volume   Culture   Final    NO GROWTH < 24 HOURS Performed at [facility], [place] [address], [place], [place] [address]    Report Status PENDING  Incomplete  Culture, blood (Routine X 2) w Reflex to ID Panel     Status: None (Preliminary result)   Collection Time: 01/24/19 11:00 AM  Result Value Ref Range Status   Specimen Description BLOOD RIGHT HAND  Final   Special Requests   Final    BOTTLES DRAWN AEROBIC ONLY Blood Culture adequate volume   Culture   Final    NO GROWTH < 24 HOURS Performed at [facility], [place] [address], [place], [place] [address]    Report Status PENDING  Incomplete    Marty Heck, DO 01/25/2019, 8:19 AM

## 2019-01-25 NOTE — Progress Notes (Signed)
NAME:  Molly [name], MRN:  [id number], DOB:  19-Mar-1986, LOS: 5 ADMISSION DATE:  01/20/2019, CONSULTATION DATE: 01/20/2019 REFERRING MD:  Bonner Puna, CHIEF COMPLAINT:  Sepsis  Brief History    33 year old white female with a history of IV drug abuse/polysubstance abuse (presented with urine drug screen positive for opiates and amphetamines ) and a previous history in June or July 2019 of inadequately treated MRSA endocarditis having signed out AMA at that time.  She had a tricuspid vegetation at that time.   She presented 4/4 hypotensive and in respiratory distress requiring intubation at [facility].   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, history of sacral osteomyelitis, history of pancytopenia, history of MRSA bacteremia, history of chronic kidney disease stage III with a GFR estimated between 27 and 60, history of recurrent skin abscesses, history of abscess in epidural space  Significant Hospital Events   01/20/2019 - Intubation and transfer to [facility] for ICU therapy  4/4 Patient is sedated intubated on Levophed and fentanyl for sedation  4/5  Discussed with family and made it clear that she is almost certainly terminal based on sepsis, renal failure, septic emboli pancytopenia and DIC    Consults:  nephrology  Procedures:  4/4 ETT >>  R femoral line 4/4 >>4/8   Significant Diagnostic Tests:  Echocardiogram 4/4 >> TV vegetation measures 1.5 mm x 0.6 mm., severe TR CT scan of the chest4/4 Bilateral pulmonary nodules, many of which are peripheral and cavitated.    Micro Data:  Blood cultures x2 4/4 >>MSSA  [facility] 4/8 >>  Antimicrobials:  Vancomycin Zosyn Teflaro >> stopped ANcef 4/4 >>  Interim history/subjective:  Urine output is improving Low-grade febrile Lethargic, off  pressors Critically ill, remains intubated   Objective   Blood pressure 106/79, pulse 93, temperature 98.8 F (37.1 C), resp. rate 11, height 5\' 1"  (1.549 m), weight 54.9 kg, SpO2 100 %.    Vent Mode: PRVC FiO2 (%):  [30 %] 30 % Set Rate:  [20 bmp] 20 bmp Vt Set:  [450 mL] 450 mL PEEP:  [5 cmH20] 5 cmH20 Plateau Pressure:  [17 cmH20-19 cmH20] 17 cmH20   Intake/Output Summary (Last 24 hours) at 01/25/2019 7829 Last data filed at 01/25/2019 0800 Gross per 24 hour  Intake 1415 ml  Output 780 ml  Net 635 ml   Filed Weights   01/21/19 0600 01/24/19 0404 01/25/19 0500  Weight: 48.5 kg 50 kg 54.9 kg      Acutely ill-appearing young woman, calm, orally intubated mild pallor, no icterus, no JVD Mucosa appears dry Clear breath sounds bilateral S1-S2 tachy, regular sinus on monitor Soft nontender abdomen Pupils 3 mm bilaterally equally reactive to light lethargic easily arousable , RA SS 0, fopllows one-step commands Skin with multiple abrasions, left toe ecchymotic area   Chest x-ray 4/8 personally reviewed which shows stable bilateral airspace disease  Resolved Hospital Problem list     ASSESSMENT / PLAN:  PULMONARY A:  Acute resp failure due to MSSA septic shock in IVDA  P:   Start spontaneous breathing trials with goal extubation  CARDIAC A: Septic shock/MRSA bacteremia Infective endocarditis/tricuspid valve P: Not a surgical candidate Levophed off   RENAL A:  AKI -not a cvvh candidate per renal Hyperkalemia improved P:  Creatinine is decreasing and hopefully can expect some  renal recovery here    GASTROINTESTINAL A:   og tube  P:   Ct TFs  HEMATOLOGIC A:  Anemia critical illness S/p 1 U PRBC 4/7, 4/8 Severe thrombocytopenia  P:  - PRBC for hgb </= 6.9gm%   Transfuse for platelets less than 10, expect to recover soon     INFECTIOUS A:   MSSA endocardiditis P:  Ct ANcef x 6 weeks from 4/8 if cultures from that date negative    ENDOCRINE A:   At risk hyperglycemia   P:   ssi  NEUROLOGIC A:   Intermittent agitation, head CT negative P:    MRI brain/spine deferred due to poor prognosis Decrease methadone 20  daily , goal RA SS 0   Summary -she seems to be improving, bacteremia has cleared and kidneys are recovering, will need 6 weeks of antibiotics for endocarditis, has severe TR so remains to be seen whether she will liberate from the vent but hopeful No CPR as d/w mom, improved prognosis given     Best practice:  Diet: npo start tF Pain/Anxiety/Delirium protocol (if indicated):Fentanyl VAP protocol (if indicated): probable mrsa septic emboli DVT prophylaxis: scd GI prophylaxis: protocol Glucose control: protocal Mobility: sedated Code Status: Limited code Family Communication:  Per discussion 4/6- no cvvh, no cpr, no mri - continue full medical care otherwise. Updated 4/8 Disposition: icu   The patient is critically ill with multiple organ systems failure and requires high complexity decision making for assessment and support, frequent evaluation and titration of therapies, application of advanced monitoring technologies and extensive interpretation of multiple databases. Critical Care Time devoted to patient care services described in this note independent of APP/resident  time is 31 minutes.      Kara Mead MD. Shade Flood. [facility] Pager [phone] If no response call [phone]    01/25/2019 9:04 AM

## 2019-01-26 DIAGNOSIS — D65 Disseminated intravascular coagulation [defibrination syndrome]: Secondary | ICD-10-CM

## 2019-01-26 LAB — BASIC METABOLIC PANEL
Anion gap: 13 (ref 5–15)
BUN: 157 mg/dL — ABNORMAL HIGH (ref 6–20)
CO2: 25 mmol/L (ref 22–32)
Calcium: 6.7 mg/dL — ABNORMAL LOW (ref 8.9–10.3)
Chloride: 103 mmol/L (ref 98–111)
Creatinine, Ser: 5.69 mg/dL — ABNORMAL HIGH (ref 0.44–1.00)
GFR calc Af Amer: 11 mL/min — ABNORMAL LOW (ref >60)
GFR calc non Af Amer: 9 mL/min — ABNORMAL LOW (ref >60)
Glucose, Bld: 137 mg/dL — ABNORMAL HIGH (ref 70–99)
Potassium: 4.3 mmol/L (ref 3.5–5.1)
Sodium: 141 mmol/L (ref 135–145)

## 2019-01-26 LAB — CBC
HCT: 22.3 % — ABNORMAL LOW (ref 36.0–46.0)
Hemoglobin: 7 g/dL — ABNORMAL LOW (ref 12.0–15.0)
MCH: 26.6 pg (ref 26.0–34.0)
MCHC: 31.4 g/dL (ref 30.0–36.0)
MCV: 84.8 fL (ref 80.0–100.0)
Platelets: 16 10*3/uL — CL (ref 150–400)
RBC: 2.63 MIL/uL — ABNORMAL LOW (ref 3.87–5.11)
RDW: 15.3 % (ref 11.5–15.5)
WBC: 5.9 10*3/uL (ref 4.0–10.5)
nRBC: 0 % (ref 0.0–0.2)

## 2019-01-26 LAB — GLUCOSE, CAPILLARY
Glucose-Capillary: 105 mg/dL — ABNORMAL HIGH (ref 70–99)
Glucose-Capillary: 113 mg/dL — ABNORMAL HIGH (ref 70–99)
Glucose-Capillary: 114 mg/dL — ABNORMAL HIGH (ref 70–99)
Glucose-Capillary: 115 mg/dL — ABNORMAL HIGH (ref 70–99)
Glucose-Capillary: 130 mg/dL — ABNORMAL HIGH (ref 70–99)
Glucose-Capillary: 130 mg/dL — ABNORMAL HIGH (ref 70–99)
Glucose-Capillary: 133 mg/dL — ABNORMAL HIGH (ref 70–99)

## 2019-01-26 NOTE — Progress Notes (Signed)
Assisted tele visit to patient with her mother.

## 2019-01-26 NOTE — Progress Notes (Signed)
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 She is currently on day 7 of cefazolin and is continuing to slowly improve. Appears to have had SBT today and remains tired but is following direction and nodding her head yes and know.   - Blood cultures continue to be negative, after five days can consider placement of PICC, with six week antibiotic therapy   Principal Problem:   Sepsis with multiple organ dysfunction (MOD) (HCC) Active Problems:   Intravenous drug abuse, continuous ([place])   Chronic anemia   Polysubstance abuse ([place])   Encounter for orogastric (OG) tube placement   Acute kidney injury superimposed on chronic kidney disease ([place])   MSSA bacteremia   Acute septic pulmonary embolism with acute cor pulmonale (June 2019)   Acute encephalopathy   Thrombocytopenia (HCC)   Sepsis with disseminated intravascular coagulopathy (DIC) ([place])   Sepsis ([place])   Acute cystitis with hematuria   DIC (disseminated intravascular coagulation) ([place])   Multifocal pneumonia   Scheduled Meds: . sodium chloride   Intravenous Once  . chlorhexidine gluconate (MEDLINE KIT)  15 mL Mouth Rinse BID  . Chlorhexidine Gluconate Cloth  6 each Topical Q0600  . Influenza vac split quadrivalent PF  0.5 mL Intramuscular Tomorrow-1000  . mouth rinse  15 mL Mouth Rinse 10 times per day  . methadone  20 mg Per Tube Daily  . pantoprazole sodium  40 mg Per Tube Daily   Continuous Infusions: .  ceFAZolin (ANCEF) IV Stopped (01/25/19 2029)  . feeding supplement (OSMOLITE 1.2 CAL) 1,000 mL (01/26/19 0035)   PRN Meds:.acetaminophen **OR** acetaminophen, fentaNYL (SUBLIMAZE) injection,  midazolam, [DISCONTINUED] ondansetron **OR** ondansetron (ZOFRAN) IV   SUBJECTIVE: Patient more tired today than yesterday. On PS/CPAP. Nodding her head yes and no and following commands.   Review of Systems: ROS limited as she is intubated.   Allergies  Allergen Reactions  . Vancomycin Other (See Comments)    Possible contributor to AKI and thrombocytopenia    OBJECTIVE: Vitals:   01/26/19 0747 01/26/19 0800 01/26/19 0900 01/26/19 1000  BP: 100/73 103/75 114/84 (!) 120/99  Pulse: (!) 107 (!) 107 (!) 112 98  Resp: 17 19 (!) 24 14  Temp: 100 F (37.8 C) 100 F (37.8 C) 100 F (37.8 C) 100.2 F (37.9 C)  TempSrc:      SpO2: 100% 100% 96% 100%  Weight:      Height:       Body mass index is 22.41 kg/m.  Physical Exam Constitution: NAD, intubated, tired appearing  HENT: ETT in place, healing excoriations over face  Cardio: tachycardic, LUSB II/VI systolic murmur  MSK: moving all extremities, no edema  Neuro: following commands, answering questions with nodding  Skin: LE purpura bilaterally   Lab Results Lab Results  Component Value Date   WBC 5.9 01/26/2019   HGB 7.0 (L) 01/26/2019   HCT 22.3 (L) 01/26/2019   MCV 84.8 01/26/2019   PLT 16 (LL) 01/26/2019    Lab Results  Component Value Date   CREATININE 5.69 (H) 01/26/2019   BUN 157 (H) 01/26/2019   NA 141 01/26/2019   K 4.3  01/26/2019   CL 103 01/26/2019   CO2 25 01/26/2019    Lab Results  Component Value Date   ALT 22 01/20/2019   AST 62 (H) 01/20/2019   ALKPHOS 167 (H) 01/20/2019   BILITOT 2.2 (H) 01/20/2019     Microbiology: Recent Results (from the past 240 hour(s))  Blood culture (routine x 2)     Status: Abnormal   Collection Time: 01/20/19  1:57 AM  Result Value Ref Range Status   Specimen Description   Final    BLOOD LEFT ANTECUBITAL Performed at [facility] [address], [place], [place] [address]    Special Requests   Final    BOTTLES DRAWN AEROBIC AND ANAEROBIC  Blood Culture adequate volume Performed at [facility] [address] [place], [place] [address]    Culture  Setup Time   Final    GRAM POSITIVE COCCI IN BOTH AEROBIC AND ANAEROBIC BOTTLES CRITICAL RESULT CALLED TO, READ BACK BY AND VERIFIED WITH: PHARMD P DANG [id number] AT 11 BY CM    Culture (A)  Final    STAPHYLOCOCCUS AUREUS SUSCEPTIBILITIES PERFORMED ON PREVIOUS CULTURE WITHIN THE LAST 5 DAYS. Performed at [facility], [place] [address], [place], [place] [address]    Report Status 01/22/2019 FINAL  Final  Blood culture (routine x 2)     Status: Abnormal   Collection Time: 01/20/19  2:02 AM  Result Value Ref Range Status   Specimen Description   Final    BLOOD RIGHT ANTECUBITAL Performed at [facility] [address] [place], [place] [address]    Special Requests   Final    BOTTLES DRAWN AEROBIC AND ANAEROBIC Blood Culture adequate volume Performed at [facility] [address], [place], [place] [address]    Culture  Setup Time   Final    GRAM POSITIVE COCCI IN BOTH AEROBIC AND ANAEROBIC BOTTLES CRITICAL RESULT CALLED TO, READ BACK BY AND VERIFIED WITH: PHARMD P DANG [id number] AT 1426 BY CM Performed at [facility], [place] [address], [place], [place] [address]    Culture STAPHYLOCOCCUS AUREUS (A)  Final   Report Status 01/22/2019 FINAL  Final   Organism ID, Bacteria STAPHYLOCOCCUS AUREUS  Final      Susceptibility   Staphylococcus aureus - MIC*    CIPROFLOXACIN <=0.5 SENSITIVE Sensitive     ERYTHROMYCIN <=0.25 SENSITIVE Sensitive     GENTAMICIN <=0.5 SENSITIVE Sensitive     OXACILLIN <=0.25 SENSITIVE Sensitive     TETRACYCLINE <=1 SENSITIVE Sensitive     VANCOMYCIN <=0.5 SENSITIVE Sensitive     TRIMETH/SULFA <=10 SENSITIVE Sensitive     CLINDAMYCIN <=0.25 SENSITIVE Sensitive     RIFAMPIN <=0.5 SENSITIVE Sensitive     Inducible Clindamycin NEGATIVE Sensitive     * STAPHYLOCOCCUS AUREUS  Blood  Culture ID Panel (Reflexed)     Status: Abnormal   Collection Time: 01/20/19  2:02 AM  Result Value Ref Range Status   Enterococcus species NOT DETECTED NOT DETECTED Final   Listeria monocytogenes NOT DETECTED NOT DETECTED Final   Staphylococcus species DETECTED (A) NOT DETECTED Final    Comment: CRITICAL RESULT CALLED TO, READ BACK BY AND VERIFIED WITH: PHARMD P DANG [id number] AT 1630 BY CM    Staphylococcus aureus (BCID) DETECTED (A) NOT DETECTED Final    Comment: Methicillin (oxacillin) susceptible Staphylococcus aureus (MSSA). Preferred therapy is anti staphylococcal beta lactam antibiotic (Cefazolin or Nafcillin), unless clinically contraindicated. CRITICAL RESULT  CALLED TO, READ BACK BY AND VERIFIED WITH: PHARMD P DANG [id number] AT 74 BY CM    Methicillin resistance NOT DETECTED NOT DETECTED Final   Streptococcus species NOT DETECTED NOT DETECTED Final   Streptococcus agalactiae NOT DETECTED NOT DETECTED Final   Streptococcus pneumoniae NOT DETECTED NOT DETECTED Final   Streptococcus pyogenes NOT DETECTED NOT DETECTED Final   Acinetobacter baumannii NOT DETECTED NOT DETECTED Final   Enterobacteriaceae species NOT DETECTED NOT DETECTED Final   Enterobacter cloacae complex NOT DETECTED NOT DETECTED Final   Escherichia coli NOT DETECTED NOT DETECTED Final   Klebsiella oxytoca NOT DETECTED NOT DETECTED Final   Klebsiella pneumoniae NOT DETECTED NOT DETECTED Final   Proteus species NOT DETECTED NOT DETECTED Final   Serratia marcescens NOT DETECTED NOT DETECTED Final   Haemophilus influenzae NOT DETECTED NOT DETECTED Final   Neisseria meningitidis NOT DETECTED NOT DETECTED Final   Pseudomonas aeruginosa NOT DETECTED NOT DETECTED Final   Candida albicans NOT DETECTED NOT DETECTED Final   Candida glabrata NOT DETECTED NOT DETECTED Final   Candida krusei NOT DETECTED NOT DETECTED Final   Candida parapsilosis NOT DETECTED NOT DETECTED Final   Candida tropicalis NOT DETECTED NOT DETECTED  Final    Comment: Performed at [facility], [place]. [address] [place], [place] [address]  Urine culture     Status: Abnormal   Collection Time: 01/20/19  2:52 AM  Result Value Ref Range Status   Specimen Description   Final    URINE, RANDOM Performed at [facility] [address], [place], [place] [address]    Special Requests   Final    NONE Performed at [facility], [place] [address], [place], [place] [address]    Culture >=100,000 COLONIES/mL STAPHYLOCOCCUS AUREUS (A)  Final   Report Status 01/22/2019 FINAL  Final   Organism ID, Bacteria STAPHYLOCOCCUS AUREUS (A)  Final      Susceptibility   Staphylococcus aureus - MIC*    CIPROFLOXACIN <=0.5 SENSITIVE Sensitive     GENTAMICIN <=0.5 SENSITIVE Sensitive     NITROFURANTOIN <=16 SENSITIVE Sensitive     OXACILLIN <=0.25 SENSITIVE Sensitive     TETRACYCLINE <=1 SENSITIVE Sensitive     VANCOMYCIN <=0.5 SENSITIVE Sensitive     TRIMETH/SULFA <=10 SENSITIVE Sensitive     CLINDAMYCIN <=0.25 SENSITIVE Sensitive     RIFAMPIN <=0.5 SENSITIVE Sensitive     Inducible Clindamycin NEGATIVE Sensitive     * >=100,000 COLONIES/mL STAPHYLOCOCCUS AUREUS  MRSA PCR Screening     Status: None   Collection Time: 01/20/19  2:55 PM  Result Value Ref Range Status   MRSA by PCR NEGATIVE NEGATIVE Final    Comment:        The GeneXpert MRSA Assay (FDA approved for NASAL specimens only), is one component of a comprehensive MRSA colonization surveillance program. It is not intended to diagnose MRSA infection nor to guide or monitor treatment for MRSA infections. Performed at [facility], [place] [address] [place], [place] [address]   Culture, blood (Routine X 2) w Reflex to ID Panel     Status: None (Preliminary result)   Collection Time: 01/24/19 10:52 AM  Result Value Ref Range Status   Specimen Description BLOOD LEFT ARM  Final   Special Requests   Final    BOTTLES DRAWN AEROBIC ONLY Blood Culture  adequate volume   Culture   Final    NO GROWTH 1 DAY Performed at [facility],  [address] [place], [place] [address]    Report Status PENDING  Incomplete  Culture, blood (Routine X 2) w Reflex to ID Panel     Status: None (Preliminary result)   Collection Time: 01/24/19 11:00 AM  Result Value Ref Range Status   Specimen Description BLOOD RIGHT HAND  Final   Special Requests   Final    BOTTLES DRAWN AEROBIC ONLY Blood Culture adequate volume   Culture   Final    NO GROWTH 1 DAY Performed at [facility], [place] [address] [place], [place] [address]    Report Status PENDING  Incomplete    Marty Heck, DO 01/26/2019, 10:28 AM

## 2019-01-26 NOTE — Progress Notes (Signed)
NAME:  Molly [name], MRN:  [id number], DOB:  July 28, 1986, LOS: 6 ADMISSION DATE:  01/20/2019, CONSULTATION DATE: 01/20/2019 REFERRING MD:  Bonner Puna, CHIEF COMPLAINT:  Sepsis  Brief History   33 year old white female with a history of IV drug abuse/polysubstance abuse (presented with urine drug screen positive for opiates and amphetamines ) and a previous history in June or July 2019 of inadequately treated MRSA endocarditis having signed out AMA at that time.  She had a tricuspid vegetation at that time.   She presented 4/4 hypotensive and in respiratory distress requiring intubation at [facility] long.  Admitted for acute respiratory failure/septic shock/ AKI due to septic emboli with MSSA bacteremia.  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, history of sacral osteomyelitis, history of pancytopenia, history of MRSA bacteremia, history of chronic kidney disease stage III with a GFR estimated between 63 and 60, history of recurrent skin abscesses, history of abscess in epidural space  Significant Hospital Events   01/20/2019 - Intubation and transfer to [facility] for ICU therapy  4/4 Patient is sedated intubated on Levophed and fentanyl for sedation 4/5  Discussed with family and made it clear that she is almost certainly terminal based on sepsis, renal failure, septic emboli pancytopenia and DIC   4/6  Poor prognosis - NO CRRT/ CPR, no MRI, continue ongoing care 4/8 off levophed, UOP poor, off fentanyl gtt, transfused 1 unit 4/9 Urine output is improving, Low-grade febrile, Lethargic,  Consults:  nephrology  Procedures:  4/4 ETT >>  R femoral line 4/4 >>4/8  Significant Diagnostic Tests:  Echocardiogram 4/4 >> TV vegetation measures 1.5 mm x 0.6 mm., severe TR CT scan of the chest4/4 Bilateral pulmonary nodules, many of which are peripheral and cavitated.   Micro Data:  4/4 MRSA PCR >> neg Blood  cultures x2 4/4 >>MSSA  [facility] 4/8 >>   Antimicrobials:  Vancomycin and Teflaro >>4/4 x 1 Zosyn 4/4 >> 4/5 ANcef 4/4 >>  Interim history/subjective:  Slowly improving sCr 7.10 -> 6.59-> 5.69, good UOP tmax 100.6 Failed SBT after 1 hour due to tachypnea on 15/5  Objective   Blood pressure 103/75, pulse (!) 107, temperature 100 F (37.8 C), resp. rate 19, height 5\' 1"  (1.549 m), weight 53.8 kg, SpO2 100 %.    Vent Mode: PRVC FiO2 (%):  [30 %] 30 % Set Rate:  [15 bmp] 15 bmp Vt Set:  [450 mL] 450 mL PEEP:  [5 cmH20] 5 cmH20 Plateau Pressure:  [16 cmH20-21 cmH20] 18 cmH20   Intake/Output Summary (Last 24 hours) at 01/26/2019 1011 Last data filed at 01/26/2019 0800 Gross per 24 hour  Intake 1555.06 ml  Output 1070 ml  Net 485.06 ml   Filed Weights   01/24/19 0404 01/25/19 0500 01/26/19 0500  Weight: 50 kg 54.9 kg 53.8 kg   General:  Acutely/ chronically ill young female in NAD on MV HEENT: MM pink/moist, ETT 7 at 21, OGT, pupils 3/reactive, anicteric  Neuro: Opens eyes, able to wiggle toes/ fingers, no gross movement to gravity CV:  RR, +murmur PULM: even/non-labored on full MV, lungs bilaterally coarse GI: soft, bs active - last BM yest Extremities: warm/dry, anasarca Skin: multiple skin abrasions, ecchymosis on left   Resolved Hospital Problem list    ASSESSMENT / PLAN:  PULMONARY A:  Acute resp failure due to MSSA septic shock in IVDA - intubated since 4/4 P:   Full MV support,  daily SBT  Intermittent CXR  CARDIAC A: Septic shock- resolved MRSA bacteremia Infective endocarditis/tricuspid valve P: Not a surgical candidate Levophed off 4/8 Goal MAP > 65  RENAL A:  AKI -not a cvvh candidate per renal Hyperkalemia- resolved  P:  sCr continues to improve with good UOP Trend renal panel    GASTROINTESTINAL A:   OG tube P:   Continue TF, trend mag/ phos  HEMATOLOGIC A:  Anemia critical illness S/p 1 U PRBC 4/7, 4/8 Severe thrombocytopenia  P:   Hgb 7.8 to 7 today, platelets slowing improving  PRBC for hgb </= 7 Transfuse for platelets less than 10, expect to recover soon  INFECTIOUS A:   MSSA endocardiditis P:   Continue Ancef x 6 weeks from 4/8 if cultures from that date negative Will need PICC when [facility] cleared for longterm abx   ENDOCRINE A:   At risk hyperglycemia   P:   Monitor CBG  NEUROLOGIC A:   Intermittent agitation, head CT negative P:    MRI brain/spine deferred due to poor prognosis methadone 20 mg daily (decreased 4/10 from BID to daily due to oversedation)   Summary -slow improvement, pending repeat BC from 4/8, ngtd.  Will need 6 weeks of antibiotics for endocarditis, has severe TR so remains to be seen whether she will liberate from the vent but hopeful  On 4/6 discussions-No CPR, not CRRT candidate   Best practice:  Diet: TF Pain/Anxiety/Delirium protocol (if indicated): methadone VAP protocol (if indicated): yes DVT prophylaxis: scd GI prophylaxis: protonix  Glucose control: trend Mobility: BR Code Status: Limited code- no CPR Family Communication:  Per discussion 4/6- no cvvh, no cpr, no mri - continue full medical care otherwise. Will update mother 4/10 Disposition: ICU   CCT 35 min  Kennieth Rad, MSN, AGACNP-BC [facility] Pgr: [phone] or if no answer [phone] 01/26/2019, 10:11 AM

## 2019-01-26 NOTE — Progress Notes (Signed)
[name] mother updated and used video chat to see pt.

## 2019-01-27 LAB — CBC
HCT: 22 % — ABNORMAL LOW (ref 36.0–46.0)
Hemoglobin: 7 g/dL — ABNORMAL LOW (ref 12.0–15.0)
MCH: 27.8 pg (ref 26.0–34.0)
MCHC: 31.8 g/dL (ref 30.0–36.0)
MCV: 87.3 fL (ref 80.0–100.0)
Platelets: 27 10*3/uL — CL (ref 150–400)
RBC: 2.52 MIL/uL — ABNORMAL LOW (ref 3.87–5.11)
RDW: 15.5 % (ref 11.5–15.5)
WBC: 5.3 10*3/uL (ref 4.0–10.5)
nRBC: 0 % (ref 0.0–0.2)

## 2019-01-27 LAB — GLUCOSE, CAPILLARY
Glucose-Capillary: 124 mg/dL — ABNORMAL HIGH (ref 70–99)
Glucose-Capillary: 123 mg/dL — ABNORMAL HIGH (ref 70–99)
Glucose-Capillary: 129 mg/dL — ABNORMAL HIGH (ref 70–99)
Glucose-Capillary: 120 mg/dL — ABNORMAL HIGH (ref 70–99)
Glucose-Capillary: 133 mg/dL — ABNORMAL HIGH (ref 70–99)

## 2019-01-27 LAB — BASIC METABOLIC PANEL
Anion gap: 17 — ABNORMAL HIGH (ref 5–15)
BUN: 160 mg/dL — ABNORMAL HIGH (ref 6–20)
CO2: 24 mmol/L (ref 22–32)
Calcium: 6.9 mg/dL — ABNORMAL LOW (ref 8.9–10.3)
Chloride: 101 mmol/L (ref 98–111)
Creatinine, Ser: 4.73 mg/dL — ABNORMAL HIGH (ref 0.44–1.00)
GFR calc Af Amer: 13 mL/min — ABNORMAL LOW (ref >60)
GFR calc non Af Amer: 11 mL/min — ABNORMAL LOW (ref >60)
Glucose, Bld: 137 mg/dL — ABNORMAL HIGH (ref 70–99)
Potassium: 4.2 mmol/L (ref 3.5–5.1)
Sodium: 142 mmol/L (ref 135–145)

## 2019-01-27 LAB — PHOSPHORUS: Phosphorus: 8.8 mg/dL — ABNORMAL HIGH (ref 2.5–4.6)

## 2019-01-27 LAB — MAGNESIUM: Magnesium: 2.1 mg/dL (ref 1.7–2.4)

## 2019-01-27 MED ORDER — MIDAZOLAM HCL 2 MG/2ML IJ SOLN
INTRAMUSCULAR | Status: AC
  Administered 2019-01-27: 2 mg via INTRAVENOUS
  Filled 2019-01-27: qty 2

## 2019-01-27 MED ORDER — LACTATED RINGERS IV BOLUS
1000.0000 mL | Freq: Once | INTRAVENOUS | Status: AC
  Administered 2019-01-27: 1000 mL via INTRAVENOUS

## 2019-01-27 MED ORDER — MIDAZOLAM HCL 2 MG/2ML IJ SOLN
2.0000 mg | Freq: Once | INTRAMUSCULAR | Status: AC
  Administered 2019-01-27: 13:00:00 2 mg via INTRAVENOUS

## 2019-01-27 MED ORDER — DEXMEDETOMIDINE HCL IN NACL 400 MCG/100ML IV SOLN
0.4000 ug/kg/h | INTRAVENOUS | Status: DC
  Administered 2019-01-27 – 2019-01-29 (×3): 0.4 ug/kg/h via INTRAVENOUS
  Administered 2019-01-29: 0.6 ug/kg/h via INTRAVENOUS
  Filled 2019-01-27 (×6): qty 100

## 2019-01-27 NOTE — Progress Notes (Signed)
RT NOTE: Pt was placed back on previous vent settings from weaning trial. Pt was getting extremely agitated with a high RR in the mid 40s and desaturating into the lower 80s. [name] vitals are stable at this time. MD informed.

## 2019-01-27 NOTE — Progress Notes (Signed)
NAME:  Molly [name], MRN:  [id number], DOB:  08/02/1986, LOS: 7 ADMISSION DATE:  01/20/2019, CONSULTATION DATE: 01/20/2019 REFERRING MD:  Bonner Puna, CHIEF COMPLAINT:  Sepsis  Brief History    33 year old white female with a history of IV drug abuse/polysubstance abuse (presented with urine drug screen positive for opiates and amphetamines ) and a previous history in June or July 2019 of inadequately treated MRSA endocarditis having signed out AMA at that time.  She had a tricuspid vegetation at that time.   She presented 4/4 hypotensive and in respiratory distress requiring intubation at [facility] long.   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, history of sacral osteomyelitis, history of pancytopenia, history of MRSA bacteremia, history of chronic kidney disease stage III with a GFR estimated between 80 and 60, history of recurrent skin abscesses, history of abscess in epidural space  Significant Hospital Events   01/20/2019 - Intubation and transfer to [facility] for ICU therapy  4/4 Patient is sedated intubated on Levophed and fentanyl for sedation  4/5  Discussed with family and made it clear that she is almost certainly terminal based on sepsis, renal failure, septic emboli pancytopenia and DIC     4/10 - Urine output is improving Low-grade febrile Lethargic, off pressors Critically ill, remains intubated  Consults:  nephrology  Procedures:  4/4 ETT >>  R femoral line 4/4 >>4/8   Significant Diagnostic Tests:  Echocardiogram 4/4 >> TV vegetation measures 1.5 mm x 0.6 mm., severe TR CT scan of the chest4/4 Bilateral pulmonary nodules, many of which are peripheral and cavitated.    Micro Data:  Blood cultures x2 4/4 >>MSSA  [facility] 4/8 >>  Antimicrobials:  Vancomycin Zosyn Teflaro >>  stopped ANcef 4/4 >>  Interim history/subjective:   4/11 - agitated and got versed prn. Following commands. Awake. Looks best in many days. However, with SBT - tachypneic and RR 37. On scheduled methadone,.Ongoing fevers + 101F  Objective   Blood pressure 115/73, pulse (!) 105, temperature (!) 100.8 F (38.2 C), resp. rate 17, height 5\' 1"  (1.549 m), weight 52.1 kg, SpO2 97 %.    Vent Mode: PRVC FiO2 (%):  [30 %] 30 % Set Rate:  [15 bmp] 15 bmp Vt Set:  [450 mL] 450 mL PEEP:  [5 cmH20] 5 cmH20 Plateau Pressure:  [15 cmH20-22 cmH20] 15 cmH20   Intake/Output Summary (Last 24 hours) at 01/27/2019 1254 Last data filed at 01/27/2019 1000 Gross per 24 hour  Intake 1475 ml  Output 945 ml  Net 530 ml   Filed Weights   01/25/19 0500 01/26/19 0500 01/27/19 0500  Weight: 54.9 kg 53.8 kg 52.1 kg    General Appearance:  Emaciated . Thin on vent Head:  Normocephalic, without obvious abnormality, atraumatic Eyes:  PERRL - yes, conjunctiva/corneas - muddy     Ears:  Normal external ear canals, both ears Nose:  G tube - no Throat:  ETT TUBE - yes , OG tube - yes Neck:  Supple,  No enlargement/tenderness/nodules Lungs: Clear to auscultation bilaterally, Ventilator   Synchrony - yes but while on SBT tachypneic Heart:  S1 and S2 normal, no murmur, CVP - no.  Pressors - no Abdomen:  Soft, no masses, no organomegaly Genitalia / Rectal:  Not done Extremities:  Extremities- intact Skin:  ntact in exposed areas .  Sacral area - not examind Neurologic:  Sedation - prn versed -> RASS - +1 . Moves all 4s - yes. CAM-ICU - neg . Orientation - follow commands      LABS    PULMONARY Recent Labs  Lab 01/20/19 1559  PHART 7.270*  PCO2ART 38.0  PO2ART 90.0  HCO3 17.4*  TCO2 19*  O2SAT 96.0    CBC Recent Labs  Lab 01/25/19 0453 01/26/19 0800 01/27/19 0400  HGB 7.8* 7.0* 7.0*  HCT 23.1* 22.3* 22.0*  WBC 4.9 5.9 5.3  PLT 13* 16* 27*    COAGULATION No results for input(s): INR in  the last 168 hours.  CARDIAC  No results for input(s): TROPONINI in the last 168 hours. No results for input(s): PROBNP in the last 168 hours.   CHEMISTRY Recent Labs  Lab 01/22/19 1155 01/22/19 1839 01/23/19 0509 01/23/19 1700 01/24/19 0342 01/25/19 0453 01/26/19 0800 01/27/19 0400  NA  --   --  138  --  137 138 141 142  K  --   --  4.9  --  4.6 4.6 4.3 4.2  CL  --   --  99  --  99 100 103 101  CO2  --   --  27  --  24 21* 25 24  GLUCOSE  --   --  169*  --  147* 134* 137* 137*  BUN  --   --  144*  --  156* 155* 157* 160*  CREATININE  --   --  7.28*  --  7.10* 6.59* 5.69* 4.73*  CALCIUM  --   --  6.1*  --  6.3* 6.5* 6.7* 6.9*  MG 2.3 2.2 2.3 2.3  --   --   --  2.1  PHOS 5.6* 5.6* 5.3* 5.2*  --   --   --  8.8*   Estimated Creatinine Clearance: 13 mL/min (A) (by C-G formula based on SCr of 4.73 mg/dL (H)).   LIVER No results for input(s): AST, ALT, ALKPHOS, BILITOT, PROT, ALBUMIN, INR in the last 168 hours.   INFECTIOUS No results for input(s): LATICACIDVEN, PROCALCITON in the last 168 hours.   ENDOCRINE CBG (last 3)  Recent Labs    01/27/19 0543 01/27/19 0805 01/27/19 1239  GLUCAP 124* 123* 129*         IMAGING x48h  - image(s) personally visualized  -   highlighted in bold No results found.   Resolved Hospital Problem list     ASSESSMENT / PLAN:  PULMONARY A:  Acute resp failure due to MSSA septic shock in IVDA   01/27/2019 - > does yes meet criteria for SBT/Extubation in setting of Acute Respiratory Failure due to above issue   P:   Start percedex and do SBT with precedex  with goal extubation  CARDIAC A: Septic shock/MRSA bacteremia Infective endocarditis/tricuspid valve  -01/27/2019 - not on pressors  P: Not a surgical candidate    RENAL A:  AKI -not a cvvh candidate per renal  01/27/2019 - improved  P:  Fluid bolus ->  Creatinine is decreasing and hopefully can expect some renal recovery here    GASTROINTESTINAL A:    og tube  P:   Ct TFs  HEMATOLOGIC A:  Anemia critical illness - S/p 1 U PRBC 4/7, 4/8 Severe thrombocytopenia  P:  - PRBC for hgb </= 6.9gm%   Transfuse for platelets less than 10, expect to recover soon     INFECTIOUS A:   MSSA endocardiditis  P:   Ct ANcef x 6 weeks from 4/8 if cultures from that date negative   ENDOCRINE A:   At risk hyperglycemia   P:   ssi  NEUROLOGIC A:   Intermittent agitation, head CT negative P:    MRI brain/spine deferred due to poor prognosis methadone 20  daily , goal RA SS 0 Versed prn  Summary -she seems to be improving, bacteremia has cleared and kidneys are recovering, will need 6 weeks of antibiotics for endocarditis, has severe TR so remains to be seen whether she will liberate from the vent but hopeful No CPR as d/w mom, improved prognosis given     Best practice:  Diet: npo start tF Pain/Anxiety/Delirium protocol (if indicated): start precedex gtt to facilitate extubation VAP protocol (if indicated): probable mrsa septic emboli DVT prophylaxis: scd GI prophylaxis: protocol Glucose control: protocal Mobility: sedated Code Status: Limited code Family Communication:  Per discussion 4/6- no cvvh, no cpr, no mri - continue full medical care otherwise. Updated 4/8 Disposition: icu    ATTESTATION & SIGNATURE   The patient Molly [name] is critically ill with multiple organ systems failure and requires high complexity decision making for assessment and support, frequent evaluation and titration of therapies, application of advanced monitoring technologies and extensive interpretation of multiple databases.   Critical Care Time devoted to patient care services described in this note is  30  Minutes. This time reflects time of care of this signee Dr Brand Males. This critical care time does not reflect procedure time, or teaching time or supervisory time of PA/NP/Med student/Med Resident etc but could involve care  discussion time     Dr. Brand Males, M.D., [facility].C.P Pulmonary and Critical Care Medicine Staff Physician [facility] Pager: [phone], If no answer or between  15:00h - 7:00h: call [phone]  01/27/2019 1:08 PM

## 2019-01-27 NOTE — Progress Notes (Signed)
Pt agitated, attempting to self-extubate. Dr. Chase Caller notified and 2mg  Versed given as ordered.

## 2019-01-28 ENCOUNTER — Inpatient Hospital Stay (HOSPITAL_COMMUNITY): Payer: Medicaid Other

## 2019-01-28 LAB — BASIC METABOLIC PANEL
Anion gap: 11 (ref 5–15)
BUN: 159 mg/dL — ABNORMAL HIGH (ref 6–20)
CO2: 28 mmol/L (ref 22–32)
Calcium: 6.9 mg/dL — ABNORMAL LOW (ref 8.9–10.3)
Chloride: 105 mmol/L (ref 98–111)
Creatinine, Ser: 4.18 mg/dL — ABNORMAL HIGH (ref 0.44–1.00)
GFR calc Af Amer: 15 mL/min — ABNORMAL LOW (ref >60)
GFR calc non Af Amer: 13 mL/min — ABNORMAL LOW (ref >60)
Glucose, Bld: 137 mg/dL — ABNORMAL HIGH (ref 70–99)
Potassium: 4.5 mmol/L (ref 3.5–5.1)
Sodium: 144 mmol/L (ref 135–145)

## 2019-01-28 LAB — HEPATIC FUNCTION PANEL
ALT: <5 U/L (ref 0–44)
AST: 23 U/L (ref 15–41)
Albumin: <1 g/dL — ABNORMAL LOW (ref 3.5–5.0)
Alkaline Phosphatase: 120 U/L (ref 38–126)
Bilirubin, Direct: 0.2 mg/dL (ref 0.0–0.2)
Indirect Bilirubin: 0.4 mg/dL (ref 0.3–0.9)
Total Bilirubin: 0.6 mg/dL (ref 0.3–1.2)
Total Protein: 6.5 g/dL (ref 6.5–8.1)

## 2019-01-28 LAB — CBC
HCT: 20.2 % — ABNORMAL LOW (ref 36.0–46.0)
Hemoglobin: 6.2 g/dL — CL (ref 12.0–15.0)
MCH: 27.2 pg (ref 26.0–34.0)
MCHC: 30.7 g/dL (ref 30.0–36.0)
MCV: 88.6 fL (ref 80.0–100.0)
Platelets: 37 10*3/uL — ABNORMAL LOW (ref 150–400)
RBC: 2.28 MIL/uL — ABNORMAL LOW (ref 3.87–5.11)
RDW: 15.6 % — ABNORMAL HIGH (ref 11.5–15.5)
WBC: 3.4 10*3/uL — ABNORMAL LOW (ref 4.0–10.5)
nRBC: 0 % (ref 0.0–0.2)

## 2019-01-28 LAB — LACTIC ACID, PLASMA: Lactic Acid, Venous: 1.3 mmol/L (ref 0.5–1.9)

## 2019-01-28 LAB — GLUCOSE, CAPILLARY
Glucose-Capillary: 117 mg/dL — ABNORMAL HIGH (ref 70–99)
Glucose-Capillary: 118 mg/dL — ABNORMAL HIGH (ref 70–99)
Glucose-Capillary: 132 mg/dL — ABNORMAL HIGH (ref 70–99)
Glucose-Capillary: 133 mg/dL — ABNORMAL HIGH (ref 70–99)
Glucose-Capillary: 135 mg/dL — ABNORMAL HIGH (ref 70–99)

## 2019-01-28 LAB — HEMOGLOBIN AND HEMATOCRIT, BLOOD
HCT: 21.7 % — ABNORMAL LOW (ref 36.0–46.0)
HCT: 21.9 % — ABNORMAL LOW (ref 36.0–46.0)
Hemoglobin: 6.7 g/dL — CL (ref 12.0–15.0)
Hemoglobin: 6.8 g/dL — CL (ref 12.0–15.0)

## 2019-01-28 LAB — MAGNESIUM: Magnesium: 2.2 mg/dL (ref 1.7–2.4)

## 2019-01-28 LAB — PREPARE RBC (CROSSMATCH)

## 2019-01-28 LAB — PHOSPHORUS: Phosphorus: 8.8 mg/dL — ABNORMAL HIGH (ref 2.5–4.6)

## 2019-01-28 MED ORDER — SODIUM CHLORIDE 0.9% IV SOLUTION: Freq: Once | INTRAVENOUS | Status: AC

## 2019-01-28 MED ORDER — SODIUM CHLORIDE 0.9% IV SOLUTION
Freq: Once | INTRAVENOUS | Status: AC
  Administered 2019-01-28: 11:00:00 via INTRAVENOUS

## 2019-01-28 MED ORDER — CEFAZOLIN SODIUM-DEXTROSE 2-4 GM/100ML-% IV SOLN
2.0000 g | Freq: Two times a day (BID) | INTRAVENOUS | Status: DC
  Administered 2019-01-29 – 2019-02-08 (×22): 2 g via INTRAVENOUS
  Filled 2019-01-28 (×23): qty 100

## 2019-01-28 MED ORDER — SODIUM CHLORIDE 0.9% IV SOLUTION
Freq: Once | INTRAVENOUS | Status: AC
  Administered 2019-01-28: 22:00:00 via INTRAVENOUS

## 2019-01-28 MED ORDER — CEFAZOLIN SODIUM-DEXTROSE 1-4 GM/50ML-% IV SOLN
1.0000 g | Freq: Once | INTRAVENOUS | Status: AC
  Administered 2019-01-28: 22:00:00 1 g via INTRAVENOUS
  Filled 2019-01-28 (×2): qty 50

## 2019-01-28 MED ORDER — CEFAZOLIN SODIUM-DEXTROSE 2-4 GM/100ML-% IV SOLN
2.0000 g | Freq: Two times a day (BID) | INTRAVENOUS | Status: DC
  Filled 2019-01-28: qty 100

## 2019-01-28 MED ORDER — FUROSEMIDE 10 MG/ML IJ SOLN
80.0000 mg | Freq: Once | INTRAMUSCULAR | Status: AC
  Administered 2019-01-28: 80 mg via INTRAVENOUS
  Filled 2019-01-28: qty 8

## 2019-01-28 NOTE — Progress Notes (Signed)
PHARMACY NOTE:  ANTIMICROBIAL RENAL DOSAGE ADJUSTMENT  Current antimicrobial regimen includes a mismatch between antimicrobial dosage and estimated renal function.  As per policy approved by the Pharmacy & Therapeutics and Medical Executive Committees, the antimicrobial dosage will be adjusted accordingly.  Current antimicrobial dosage:  Cefazolin 1g IV q24h  Indication: MSSA bacteremia, TV vegetation  Renal Function:  Estimated Creatinine Clearance: 14.7 mL/min (A) (by C-G formula based on SCr of 4.18 mg/dL (H)).    Antimicrobial dosage has been changed to:  Cefazolin 2g IV q12h  Elicia Lamp, PharmD, BCPS Please check AMION for all [facility] contact numbers Clinical Pharmacist 01/28/2019 9:23 PM

## 2019-01-28 NOTE — Progress Notes (Signed)
eLink Physician-Brief Progress Note Patient Name: Molly [name] DOB: [birth year] MRN: [id number]   Date of Service  01/28/2019  HPI/Events of Note  Drop in Hgb from 7.0 to 6.2 No anticoagulation on board  eICU Interventions  Transfuse 1 unit pRBC Post-transfusion CBC     Intervention Category Intermediate Interventions: Other:  Lynne Righi 01/28/2019, 6:05 AM

## 2019-01-28 NOTE — Progress Notes (Signed)
eLink Physician-Brief Progress Note Patient Name: Molly [name] DOB: May 11, 1986 MRN: [id number]   Date of Service  01/28/2019  HPI/Events of Note  Anemia, thrombocytopenia from MSSA endocarditis sepsis. Off of pressors. S/p PRBC 1 in am, follow up Hg still at 6.8, no active bleeding as per bed side RN.  On 30% fio2.   eICU Interventions  - 1 PRBC over 3 hrs, follow Hg/hct post.      Intervention Category Intermediate Interventions: Coagulopathy - evaluation and management Minor Interventions: Communication with other healthcare providers and/or family  Elmer Sow 01/28/2019, 8:59 PM

## 2019-01-28 NOTE — Progress Notes (Signed)
NAME:  Molly [name], MRN:  [id number], DOB:  1986-02-09, LOS: 8 ADMISSION DATE:  01/20/2019, CONSULTATION DATE: 01/20/2019 REFERRING MD:  Bonner Puna, CHIEF COMPLAINT:  Sepsis  Brief History    33 year old white female with a history of IV drug abuse/polysubstance abuse (presented with urine drug screen positive for opiates and amphetamines ) and a previous history in June or July 2019 of inadequately treated MRSA endocarditis having signed out AMA at that time.  She had a tricuspid vegetation at that time.   She presented 4/4 hypotensive and in respiratory distress requiring intubation at [facility] long.   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, history of sacral osteomyelitis, history of pancytopenia, history of MRSA bacteremia, history of chronic kidney disease stage III with a GFR estimated between 30 and 60, history of recurrent skin abscesses, history of abscess in epidural space  Significant Hospital Events   01/20/2019 - Intubation and transfer to [facility] for ICU therapy  4/4 Patient is sedated intubated on Levophed and fentanyl for sedation  4/5  Discussed with family and made it clear that she is almost certainly terminal based on sepsis, renal failure, septic emboli pancytopenia and DIC     4/10 - Urine output is improving Low-grade febrile Lethargic, off pressors Critically ill, remains intubated    4/11 - agitated and got versed prn. Following commands. Awake. Looks best in many days. However, with SBT - tachypneic and RR 37. On scheduled methadone,.Ongoing fevers + 101F   Consults:  nephrology  Procedures:  4/4 ETT >>  R femoral line 4/4 >>4/8   Significant Diagnostic Tests:  Echocardiogram 4/4 >> TV vegetation measures 1.5 mm x 0.6 mm., severe TR CT scan of the chest4/4  Bilateral pulmonary nodules, many of which are peripheral and cavitated.    Micro Data:  Blood cultures x2 4/4 >>MSSA  [facility] 4/8 >>  Antimicrobials:  Vancomycin Zosyn Teflaro >> stopped ANcef 4/4 >>  Interim history/subjective:    4/12 - failed sbt  Yesterday. AKI better. Going to get PRBC  Today for anemia of critical illness  + 5.6L since admit. LEss alert on precedex and methadone  Objective   Blood pressure 92/66, pulse 84, temperature (!) 100.9 F (38.3 C), resp. rate 17, height 5\' 1"  (1.549 m), weight 52.2 kg, SpO2 99 %.    Vent Mode: PRVC FiO2 (%):  [30 %] 30 % Set Rate:  [15 bmp] 15 bmp Vt Set:  [450 mL] 450 mL PEEP:  [5 cmH20] 5 cmH20 Pressure Support:  [10 cmH20] 10 cmH20 Plateau Pressure:  [11 cmH20-21 cmH20] 19 cmH20   Intake/Output Summary (Last 24 hours) at 01/28/2019 0951 Last data filed at 01/28/2019 0800 Gross per 24 hour  Intake 2453.37 ml  Output 1510 ml  Net 943.37 ml   Filed Weights   01/26/19 0500 01/27/19 0500 01/28/19 0453  Weight: 53.8 kg 52.1 kg 52.2 kg   General Appearance:  Looks criticall ill Head:  Normocephalic, without obvious abnormality, atraumatic Eyes:  PERRL - yes, conjunctiva/corneas - muddy     Ears:  Normal external ear canals, both ears Nose:  G tube - no Throat:  ETT TUBE - yes , OG tube - yes Neck:  Supple,  No enlargement/tenderness/nodules Lungs: Clear to auscultation bilaterally, Ventilator   Synchrony - yes Heart:  S1 and S2  normal, no murmur, CVP - no.  Pressors - no Abdomen:  Soft, no masses, no organomegaly Genitalia / Rectal:  Not done Extremities:  Extremities- intact Skin:  ntact in exposed areas .  Neurologic:  Sedation - precedex gtt and methadone -> RASS - -4       LABS    PULMONARY No results for input(s): PHART, PCO2ART, PO2ART, HCO3, TCO2, O2SAT in the last 168 hours.  Invalid input(s): PCO2, PO2  CBC Recent Labs  Lab 01/26/19 0800 01/27/19 0400 01/28/19 0340  HGB 7.0* 7.0* 6.2*  HCT 22.3*  22.0* 20.2*  WBC 5.9 5.3 3.4*  PLT 16* 27* 37*    COAGULATION No results for input(s): INR in the last 168 hours.  CARDIAC  No results for input(s): TROPONINI in the last 168 hours. No results for input(s): PROBNP in the last 168 hours.   CHEMISTRY Recent Labs  Lab 01/22/19 1839 01/23/19 0509 01/23/19 1700 01/24/19 0342 01/25/19 0453 01/26/19 0800 01/27/19 0400 01/28/19 0340  NA  --  138  --  137 138 141 142 144  K  --  4.9  --  4.6 4.6 4.3 4.2 4.5  CL  --  99  --  99 100 103 101 105  CO2  --  27  --  24 21* 25 24 28   GLUCOSE  --  169*  --  147* 134* 137* 137* 137*  BUN  --  144*  --  156* 155* 157* 160* 159*  CREATININE  --  7.28*  --  7.10* 6.59* 5.69* 4.73* 4.18*  CALCIUM  --  6.1*  --  6.3* 6.5* 6.7* 6.9* 6.9*  MG 2.2 2.3 2.3  --   --   --  2.1 2.2  PHOS 5.6* 5.3* 5.2*  --   --   --  8.8* 8.8*   Estimated Creatinine Clearance: 14.7 mL/min (A) (by C-G formula based on SCr of 4.18 mg/dL (H)).   LIVER Recent Labs  Lab 01/28/19 0340  AST 23  ALT <5  ALKPHOS 120  BILITOT 0.6  PROT 6.5  ALBUMIN <1.0*     INFECTIOUS Recent Labs  Lab 01/28/19 0340  LATICACIDVEN 1.3     ENDOCRINE CBG (last 3)  Recent Labs    01/27/19 2051 01/28/19 0003 01/28/19 0801  GLUCAP 133* 117* 132*         IMAGING x48h  - image(s) personally visualized  -   highlighted in bold Dg Chest Port 1 View  Result Date: 01/28/2019 CLINICAL DATA:  Endotracheal tube position EXAM: PORTABLE CHEST 1 VIEW COMPARISON:  01/24/2019 FINDINGS: Endotracheal tube in good position.  NG in the stomach. Progression of diffuse bilateral airspace disease basilar predominant. Probable small bilateral effusions. No pneumothorax. IMPRESSION: Endotracheal tube in good position. Progression of diffuse bilateral airspace disease with basilar predominance. Electronically Signed   By: Franchot Gallo M.D.   On: 01/28/2019 08:12     Resolved Hospital Problem list     ASSESSMENT / PLAN:  PULMONARY  A:  Acute resp failure due to MSSA septic shock in IVDA  01/28/2019 - > Failed SBT . does not  meet criteria for SBT/Extubation in setting of Acute Respiratory Failure due to acute pulmnary infiltraes from volume an MSSA   P:   PRVC HOB > 30 degrees  CARDIAC A: Septic shock/MRSA bacteremia Infective endocarditis/tricuspid valve  -01/28/2019 - not on pressors P: Not a surgical candidate    RENAL A:  AKI -not a cvvh candidate per renal  01/28/2019 - improving but is volume overloaded + 5L   P:  Lasix    GASTROINTESTINAL A:   og tube  P:   Ct TFs  HEMATOLOGIC A:  Anemia critical illness - S/p 1 U PRBC 4/7, 4/8 Severe thrombocytopenia  01/28/2019 - hgb M 7gm% again . Low platelets recovering  P:  - PRBC for hgb </= 6.9gm%   - repeat 01/28/2019 Transfuse for platelets less than 10, expect to recover soon     INFECTIOUS A:   MSSA endocardiditis P:   Ct ANcef x 6 weeks from 4/8 if cultures from that date negative   ENDOCRINE A:   At risk hyperglycemia   P:   ssi  NEUROLOGIC A:   Intermittent agitation, head CT negative P:    MRI brain/spine deferred due to poor prognosis methadone 20  daily , goal RA SS 0 Versed prn precedex  Summary -she seems to be improving, bacteremia has cleared and kidneys are recovering, will need 6 weeks of antibiotics for endocarditis, has severe TR so remains to be seen whether she will liberate from the vent but hopeful No CPR as d/w mom, improved prognosis given     Best practice:  Diet: npo start tF Pain/Anxiety/Delirium protocol (if indicated): start precedex gtt to facilitate extubation VAP protocol (if indicated): probable mrsa septic emboli DVT prophylaxis: scd GI prophylaxis: protocol Glucose control: protocal Mobility: sedated Code Status: Limited code Family Communication: -   Per discussion 4/6- no cvvh, no cpr, no mri - continue full medical care otherwise. Updated 4/8. On 4/11 - LMTCB   Disposition: icu    ATTESTATION & SIGNATURE   The patient Albirtha Grinage is critically ill with multiple organ systems failure and requires high complexity decision making for assessment and support, frequent evaluation and titration of therapies, application of advanced monitoring technologies and extensive interpretation of multiple databases.   Critical Care Time devoted to patient care services described in this note is  30  Minutes. This time reflects time of care of this signee Dr Brand Males. This critical care time does not reflect procedure time, or teaching time or supervisory time of PA/NP/Med student/Med Resident etc but could involve care discussion time     Dr. Brand Males, M.D., [facility].C.P Pulmonary and Critical Care Medicine Staff Physician [place] [facility] Pager: [phone], If no answer or between  15:00h - 7:00h: call [phone]  01/28/2019 9:51 AM

## 2019-01-29 ENCOUNTER — Inpatient Hospital Stay (HOSPITAL_COMMUNITY): Payer: Medicaid Other

## 2019-01-29 ENCOUNTER — Inpatient Hospital Stay: Payer: Self-pay

## 2019-01-29 LAB — CBC
HCT: 31.5 % — ABNORMAL LOW (ref 36.0–46.0)
HCT: 23.9 % — ABNORMAL LOW (ref 36.0–46.0)
Hemoglobin: 10.3 g/dL — ABNORMAL LOW (ref 12.0–15.0)
Hemoglobin: 7.5 g/dL — ABNORMAL LOW (ref 12.0–15.0)
MCH: 28.2 pg (ref 26.0–34.0)
MCH: 27.3 pg (ref 26.0–34.0)
MCHC: 32.7 g/dL (ref 30.0–36.0)
MCHC: 31.4 g/dL (ref 30.0–36.0)
MCV: 86.3 fL (ref 80.0–100.0)
MCV: 86.9 fL (ref 80.0–100.0)
Platelets: 37 10*3/uL — ABNORMAL LOW (ref 150–400)
Platelets: 58 10*3/uL — ABNORMAL LOW (ref 150–400)
RBC: 3.65 MIL/uL — ABNORMAL LOW (ref 3.87–5.11)
RBC: 2.75 MIL/uL — ABNORMAL LOW (ref 3.87–5.11)
RDW: 15.4 % (ref 11.5–15.5)
RDW: 15.8 % — ABNORMAL HIGH (ref 11.5–15.5)
WBC: 3.1 10*3/uL — ABNORMAL LOW (ref 4.0–10.5)
WBC: 4.7 10*3/uL (ref 4.0–10.5)
nRBC: 0 % (ref 0.0–0.2)
nRBC: 0 % (ref 0.0–0.2)

## 2019-01-29 LAB — BASIC METABOLIC PANEL
Anion gap: 11 (ref 5–15)
BUN: 162 mg/dL — ABNORMAL HIGH (ref 6–20)
CO2: 28 mmol/L (ref 22–32)
Calcium: 6.6 mg/dL — ABNORMAL LOW (ref 8.9–10.3)
Chloride: 105 mmol/L (ref 98–111)
Creatinine, Ser: 4.03 mg/dL — ABNORMAL HIGH (ref 0.44–1.00)
GFR calc Af Amer: 16 mL/min — ABNORMAL LOW (ref >60)
GFR calc non Af Amer: 14 mL/min — ABNORMAL LOW (ref >60)
Glucose, Bld: 138 mg/dL — ABNORMAL HIGH (ref 70–99)
Potassium: 4.7 mmol/L (ref 3.5–5.1)
Sodium: 144 mmol/L (ref 135–145)

## 2019-01-29 LAB — GLUCOSE, CAPILLARY
Glucose-Capillary: 104 mg/dL — ABNORMAL HIGH (ref 70–99)
Glucose-Capillary: 110 mg/dL — ABNORMAL HIGH (ref 70–99)
Glucose-Capillary: 114 mg/dL — ABNORMAL HIGH (ref 70–99)
Glucose-Capillary: 119 mg/dL — ABNORMAL HIGH (ref 70–99)
Glucose-Capillary: 119 mg/dL — ABNORMAL HIGH (ref 70–99)
Glucose-Capillary: 124 mg/dL — ABNORMAL HIGH (ref 70–99)

## 2019-01-29 LAB — CULTURE, BLOOD (ROUTINE X 2)
Culture: NO GROWTH
Culture: NO GROWTH
Special Requests: ADEQUATE
Special Requests: ADEQUATE

## 2019-01-29 LAB — MAGNESIUM: Magnesium: 2.1 mg/dL (ref 1.7–2.4)

## 2019-01-29 LAB — PHOSPHORUS: Phosphorus: 9.1 mg/dL — ABNORMAL HIGH (ref 2.5–4.6)

## 2019-01-29 LAB — PREPARE RBC (CROSSMATCH)

## 2019-01-29 MED ORDER — NEPRO/CARBSTEADY PO LIQD
1000.0000 mL | ORAL | Status: DC
  Administered 2019-01-30 – 2019-01-31 (×2): 1000 mL
  Filled 2019-01-29 (×3): qty 1000

## 2019-01-29 MED ORDER — WHITE PETROLATUM EX OINT
TOPICAL_OINTMENT | CUTANEOUS | Status: AC
  Administered 2019-01-29: 1
  Filled 2019-01-29: qty 28.35

## 2019-01-29 MED ORDER — FREE WATER
200.0000 mL | Freq: Three times a day (TID) | Status: DC
  Administered 2019-01-29 – 2019-02-02 (×12): 200 mL

## 2019-01-29 MED ORDER — FUROSEMIDE 10 MG/ML IJ SOLN
40.0000 mg | Freq: Three times a day (TID) | INTRAMUSCULAR | Status: DC
  Administered 2019-01-29 – 2019-01-31 (×7): 40 mg via INTRAVENOUS
  Filled 2019-01-29 (×7): qty 4

## 2019-01-29 NOTE — Progress Notes (Addendum)
Nutrition Follow-up  RD working remotely.  DOCUMENTATION CODES:   Not applicable  INTERVENTION:   Tube Feeding:  Switch to Nepro at 40 ml/hr when current bottle of Osmolite 1.2 runs out. Discussed with RN Provides 78 g of protein (1.7 g/kg), 1728 kcals, 698 mL of free water  Add free water of 200 mL TID; total free water 1298 mL. Change in TF provides less free water, sodium trending up   NUTRITION DIAGNOSIS:   Inadequate oral intake related to inability to eat(pt sedated and ventilated ) as evidenced by NPO status.  Being addressed via TF   GOAL:   Provide needs based on ASPEN/SCCM guidelines  Progressing   MONITOR:   Vent status, Labs, Weight trends, I & O's, Skin  REASON FOR ASSESSMENT:   Ventilator    ASSESSMENT:    33 y/o female with h/o IVDU/polysubstance abuse and MRSA bacteremia/TV endocarditis/spinal osteomyelitis/septic pulmonary emboli (left AMA July 2019) who presented to the ED with AMS found to be septic with hypothermia, hypotension, and hypoxia.   Patient is currently intubated on ventilator support, precedex, attempting weaning trials MV: 6.7 L/min Temp (24hrs), Avg:99.6 F (37.6 C), Min:97.9 F (36.6 C), Max:100.8 F (38.2 C)   Osmolite 1.5 at 55 ml/hr, tolerating per RN  Phosphorus persistently elevated; no plan for RRT. Plan to change TF formula to Nepro  Admission wt 45.8 kg; current wt 55.4 kg. Net +15 L per I/O flow sheet. 2+ edema present. Plan to utilize 45.8 kg as EDW   Labs: Phosphorus 9.1 (H), Creatinine 4.03, BUN 138, potassium wdl, corrected calcium 9.3 (wdl), albumin <1.0 Meds: methadone    Diet Order:   Diet Order            Diet NPO time specified  Diet effective now              EDUCATION NEEDS:   Not appropriate for education at this time  Skin:  Skin Assessment: Reviewed RN Assessment(petechiae)  Last BM:  4/12  Height:   Ht Readings from Last 1 Encounters:  01/20/19 5\' 1"  (1.549 m)    Weight:    Wt Readings from Last 1 Encounters:  01/29/19 55.4 kg    Ideal Body Weight:  47.7 kg  BMI:  Body mass index is 23.08 kg/m.  Estimated Nutritional Needs:   Kcal:  1560 kcals   Protein:  70-90 g   Fluid:  >/= 1.5 L   Kerman Passey MS, RD, LDN, CNSC [phone] Pager  [phone] Weekend/On-Call Pager

## 2019-01-29 NOTE — Progress Notes (Addendum)
Subjective:  She was still on the ventilator was being bathed   Antibiotics:  Anti-infectives (From admission, onward)   Start     Dose/Rate Route Frequency Ordered Stop   01/29/19 1000  ceFAZolin (ANCEF) IVPB 2g/100 mL premix     2 g 200 mL/hr over 30 Minutes Intravenous Every 12 hours 01/28/19 2145     01/28/19 2200  ceFAZolin (ANCEF) IVPB 1 g/50 mL premix     1 g 100 mL/hr over 30 Minutes Intravenous  Once 01/28/19 2145 01/28/19 2248   01/28/19 2130  ceFAZolin (ANCEF) IVPB 2g/100 mL premix  Status:  Discontinued     2 g 200 mL/hr over 30 Minutes Intravenous Every 12 hours 01/28/19 2121 01/28/19 2145   01/20/19 2000  ceFAZolin (ANCEF) IVPB 1 g/50 mL premix  Status:  Discontinued     1 g 100 mL/hr over 30 Minutes Intravenous Every 24 hours 01/20/19 1831 01/28/19 2121   01/20/19 1400  piperacillin-tazobactam (ZOSYN) IVPB 2.25 g  Status:  Discontinued    Note to Pharmacy:  Pharmacy to dose   2.25 g 66.7 mL/hr over 30 Minutes Intravenous Every 8 hours 01/20/19 1234 01/20/19 1239   01/20/19 1400  piperacillin-tazobactam (ZOSYN) IVPB 2.25 g  Status:  Discontinued     2.25 g 100 mL/hr over 30 Minutes Intravenous Every 8 hours 01/20/19 1239 01/20/19 1831   01/20/19 0545  ceftaroline (TEFLARO) injection SOLR 300 mg  Status:  Discontinued     300 mg Intravenous  Once 01/20/19 0534 01/20/19 0535   01/20/19 0545  ceftaroline (TEFLARO) 300 mg in sodium chloride 0.9 % 250 mL IVPB     300 mg 250 mL/hr over 60 Minutes Intravenous  Once 01/20/19 0539 01/20/19 0727   01/20/19 0430  vancomycin (VANCOCIN) IVPB 1000 mg/200 mL premix  Status:  Discontinued     1,000 mg 200 mL/hr over 60 Minutes Intravenous  Once 01/20/19 0348 01/20/19 0519   01/20/19 0400  piperacillin-tazobactam (ZOSYN) IVPB 3.375 g     3.375 g 100 mL/hr over 30 Minutes Intravenous  Once 01/20/19 0348 01/20/19 0423      Medications: Scheduled Meds: . chlorhexidine gluconate (MEDLINE KIT)  15 mL Mouth Rinse BID   . Chlorhexidine Gluconate Cloth  6 each Topical Q0600  . free water  200 mL Per Tube Q8H  . mouth rinse  15 mL Mouth Rinse 10 times per day  . methadone  20 mg Per Tube Daily  . pantoprazole sodium  40 mg Per Tube Daily   Continuous Infusions: .  ceFAZolin (ANCEF) IV 2 g (01/29/19 0944)  . dexmedetomidine (PRECEDEX) IV infusion 0.4 mcg/kg/hr (01/29/19 0727)  . [START ON 01/30/2019] feeding supplement (NEPRO CARB STEADY)    . feeding supplement (OSMOLITE 1.2 CAL) 1,000 mL (01/29/19 0943)   PRN Meds:.acetaminophen **OR** acetaminophen, [DISCONTINUED] ondansetron **OR** ondansetron (ZOFRAN) IV    Objective: Weight change: 3.2 kg  Intake/Output Summary (Last 24 hours) at 01/29/2019 1107 Last data filed at 01/29/2019 0947 Gross per 24 hour  Intake 1850.62 ml  Output 1100 ml  Net 750.62 ml   Blood pressure 105/75, pulse 83, temperature 99.9 F (37.7 C), resp. rate 16, height '5\' 1"'  (1.549 m), weight 55.4 kg, SpO2 100 %. Temp:  [97.9 F (36.6 C)-100.2 F (37.9 C)] 99.9 F (37.7 C) (04/13 1000) Pulse Rate:  [68-87] 83 (04/13 1000) Resp:  [15-19] 16 (04/13 1000) BP: (84-108)/(52-77) 105/75 (04/13 1000) SpO2:  [95 %-100 %] 100 % (  04/13 1000) FiO2 (%):  [30 %] 30 % (04/13 0740) Weight:  [55.4 kg] 55.4 kg (04/13 0423)  Physical Exam: General: Alert and awake,  Still on the ventilator was being bathed when I walked in the room and walked out again. Neuro: nonfocal  CBC:    BMET Recent Labs    01/28/19 0340 01/29/19 0234  NA 144 144  K 4.5 4.7  CL 105 105  CO2 28 28  GLUCOSE 137* 138*  BUN 159* 162*  CREATININE 4.18* 4.03*  CALCIUM 6.9* 6.6*     Liver Panel  Recent Labs    01/28/19 0340  PROT 6.5  ALBUMIN <1.0*  AST 23  ALT <5  ALKPHOS 120  BILITOT 0.6  BILIDIR 0.2  IBILI 0.4       Sedimentation Rate No results for input(s): ESRSEDRATE in the last 72 hours. C-Reactive Protein No results for input(s): CRP in the last 72 hours.  Micro Results:  Recent Results (from the past 720 hour(s))  Blood culture (routine x 2)     Status: Abnormal   Collection Time: 01/20/19  1:57 AM  Result Value Ref Range Status   Specimen Description   Final    BLOOD LEFT ANTECUBITAL Performed at [facility] [address], [place], [place] [address]    Special Requests   Final    BOTTLES DRAWN AEROBIC AND ANAEROBIC Blood Culture adequate volume Performed at [facility] [address], [place], [place] [address]    Culture  Setup Time   Final    GRAM POSITIVE COCCI IN BOTH AEROBIC AND ANAEROBIC BOTTLES CRITICAL RESULT CALLED TO, READ BACK BY AND VERIFIED WITH: PHARMD P DANG [id number] AT 99 BY CM    Culture (A)  Final    STAPHYLOCOCCUS AUREUS SUSCEPTIBILITIES PERFORMED ON PREVIOUS CULTURE WITHIN THE LAST 5 DAYS. Performed at [facility], [place] [address], [place], [place] [address]    Report Status 01/22/2019 FINAL  Final  Blood culture (routine x 2)     Status: Abnormal   Collection Time: 01/20/19  2:02 AM  Result Value Ref Range Status   Specimen Description   Final    BLOOD RIGHT ANTECUBITAL Performed at [facility] [address] [place], [place] [address]    Special Requests   Final    BOTTLES DRAWN AEROBIC AND ANAEROBIC Blood Culture adequate volume Performed at [facility] [address], [place], [place] [address]    Culture  Setup Time   Final    GRAM POSITIVE COCCI IN BOTH AEROBIC AND ANAEROBIC BOTTLES CRITICAL RESULT CALLED TO, READ BACK BY AND VERIFIED WITH: PHARMD P DANG [id number] AT 1426 BY CM Performed at [facility], [place] [address] [place], [place] [address]    Culture STAPHYLOCOCCUS AUREUS (A)  Final   Report Status 01/22/2019 FINAL  Final   Organism ID, Bacteria STAPHYLOCOCCUS AUREUS  Final      Susceptibility   Staphylococcus aureus - MIC*    CIPROFLOXACIN <=0.5 SENSITIVE Sensitive     ERYTHROMYCIN <=0.25 SENSITIVE Sensitive      GENTAMICIN <=0.5 SENSITIVE Sensitive     OXACILLIN <=0.25 SENSITIVE Sensitive     TETRACYCLINE <=1 SENSITIVE Sensitive     VANCOMYCIN <=0.5 SENSITIVE Sensitive     TRIMETH/SULFA <=10 SENSITIVE Sensitive     CLINDAMYCIN <=0.25 SENSITIVE Sensitive     RIFAMPIN <=0.5 SENSITIVE Sensitive     Inducible Clindamycin NEGATIVE Sensitive     *  STAPHYLOCOCCUS AUREUS  Blood Culture ID Panel (Reflexed)     Status: Abnormal   Collection Time: 01/20/19  2:02 AM  Result Value Ref Range Status   Enterococcus species NOT DETECTED NOT DETECTED Final   Listeria monocytogenes NOT DETECTED NOT DETECTED Final   Staphylococcus species DETECTED (A) NOT DETECTED Final    Comment: CRITICAL RESULT CALLED TO, READ BACK BY AND VERIFIED WITH: PHARMD P DANG [id number] AT 1630 BY CM    Staphylococcus aureus (BCID) DETECTED (A) NOT DETECTED Final    Comment: Methicillin (oxacillin) susceptible Staphylococcus aureus (MSSA). Preferred therapy is anti staphylococcal beta lactam antibiotic (Cefazolin or Nafcillin), unless clinically contraindicated. CRITICAL RESULT CALLED TO, READ BACK BY AND VERIFIED WITH: PHARMD P DANG [id number] AT 59 BY CM    Methicillin resistance NOT DETECTED NOT DETECTED Final   Streptococcus species NOT DETECTED NOT DETECTED Final   Streptococcus agalactiae NOT DETECTED NOT DETECTED Final   Streptococcus pneumoniae NOT DETECTED NOT DETECTED Final   Streptococcus pyogenes NOT DETECTED NOT DETECTED Final   Acinetobacter baumannii NOT DETECTED NOT DETECTED Final   Enterobacteriaceae species NOT DETECTED NOT DETECTED Final   Enterobacter cloacae complex NOT DETECTED NOT DETECTED Final   Escherichia coli NOT DETECTED NOT DETECTED Final   Klebsiella oxytoca NOT DETECTED NOT DETECTED Final   Klebsiella pneumoniae NOT DETECTED NOT DETECTED Final   Proteus species NOT DETECTED NOT DETECTED Final   Serratia marcescens NOT DETECTED NOT DETECTED Final   Haemophilus influenzae NOT DETECTED NOT DETECTED  Final   Neisseria meningitidis NOT DETECTED NOT DETECTED Final   Pseudomonas aeruginosa NOT DETECTED NOT DETECTED Final   Candida albicans NOT DETECTED NOT DETECTED Final   Candida glabrata NOT DETECTED NOT DETECTED Final   Candida krusei NOT DETECTED NOT DETECTED Final   Candida parapsilosis NOT DETECTED NOT DETECTED Final   Candida tropicalis NOT DETECTED NOT DETECTED Final    Comment: Performed at [facility], [place] [address] [place], [place] [address]  Urine culture     Status: Abnormal   Collection Time: 01/20/19  2:52 AM  Result Value Ref Range Status   Specimen Description   Final    URINE, RANDOM Performed at [facility] [address], [place], [place] [address]    Special Requests   Final    NONE Performed at [facility], [place] [address] [place], [place] [address]    Culture >=100,000 COLONIES/mL STAPHYLOCOCCUS AUREUS (A)  Final   Report Status 01/22/2019 FINAL  Final   Organism ID, Bacteria STAPHYLOCOCCUS AUREUS (A)  Final      Susceptibility   Staphylococcus aureus - MIC*    CIPROFLOXACIN <=0.5 SENSITIVE Sensitive     GENTAMICIN <=0.5 SENSITIVE Sensitive     NITROFURANTOIN <=16 SENSITIVE Sensitive     OXACILLIN <=0.25 SENSITIVE Sensitive     TETRACYCLINE <=1 SENSITIVE Sensitive     VANCOMYCIN <=0.5 SENSITIVE Sensitive     TRIMETH/SULFA <=10 SENSITIVE Sensitive     CLINDAMYCIN <=0.25 SENSITIVE Sensitive     RIFAMPIN <=0.5 SENSITIVE Sensitive     Inducible Clindamycin NEGATIVE Sensitive     * >=100,000 COLONIES/mL STAPHYLOCOCCUS AUREUS  MRSA PCR Screening     Status: None   Collection Time: 01/20/19  2:55 PM  Result Value Ref Range Status   MRSA by PCR NEGATIVE NEGATIVE Final    Comment:        The GeneXpert MRSA Assay (FDA approved for NASAL specimens only), is one component of a comprehensive  MRSA colonization surveillance program. It is not intended to diagnose MRSA infection nor to guide or monitor treatment  for MRSA infections. Performed at [facility], [place] [address] [place], [place] [address]   Culture, blood (Routine X 2) w Reflex to ID Panel     Status: None (Preliminary result)   Collection Time: 01/24/19 10:52 AM  Result Value Ref Range Status   Specimen Description BLOOD LEFT ARM  Final   Special Requests   Final    BOTTLES DRAWN AEROBIC ONLY Blood Culture adequate volume   Culture   Final    NO GROWTH 4 DAYS Performed at [facility], [place] [address] [place], [place] [address]    Report Status PENDING  Incomplete  Culture, blood (Routine X 2) w Reflex to ID Panel     Status: None (Preliminary result)   Collection Time: 01/24/19 11:00 AM  Result Value Ref Range Status   Specimen Description BLOOD RIGHT HAND  Final   Special Requests   Final    BOTTLES DRAWN AEROBIC ONLY Blood Culture adequate volume   Culture   Final    NO GROWTH 4 DAYS Performed at [facility], [place] [address], [place], [place] [address]    Report Status PENDING  Incomplete    Studies/Results: Dg Chest Port 1 View  Result Date: 01/29/2019 CLINICAL DATA:  Endotracheal tube present. EXAM: PORTABLE CHEST 1 VIEW COMPARISON:  Chest x-ray from yesterday. FINDINGS: Unchanged endotracheal and enteric tubes. Bilateral basilar predominant airspace disease is unchanged. Cavitary nodules in the peripheral right upper lobe and left lung base are unchanged. No pneumothorax. No acute osseous abnormality. IMPRESSION: 1. Unchanged bilateral basilar predominant airspace disease and scattered cavitary nodules. Electronically Signed   By: Titus Dubin M.D.   On: 01/29/2019 07:56   Dg Chest Port 1 View  Result Date: 01/28/2019 CLINICAL DATA:  Endotracheal tube position EXAM: PORTABLE CHEST 1 VIEW COMPARISON:  01/24/2019 FINDINGS: Endotracheal tube in good position.  NG in the stomach. Progression of diffuse bilateral airspace disease basilar predominant. Probable small bilateral effusions. No pneumothorax.  IMPRESSION: Endotracheal tube in good position. Progression of diffuse bilateral airspace disease with basilar predominance. Electronically Signed   By: Franchot Gallo M.D.   On: 01/28/2019 08:12      Assessment/Plan:  INTERVAL HISTORY: Repeat blood cultures remain sterile and renal function is improved   Principal Problem:   Sepsis with multiple organ dysfunction (MOD) (HCC) Active Problems:   Intravenous drug abuse, continuous (HCC)   Acute respiratory failure (HCC)   Chronic anemia   Polysubstance abuse ([place])   Encounter for orogastric (OG) tube placement   Acute kidney failure ([place])   MSSA bacteremia   Acute septic pulmonary embolism with acute cor pulmonale (June 2019)   Acute encephalopathy   Thrombocytopenia (HCC)   Sepsis with disseminated intravascular coagulopathy (DIC) ([place])   Sepsis ([place])   Acute cystitis with hematuria   DIC (disseminated intravascular coagulation) ([place])   Multifocal pneumonia    Kagan Hietpas is a 33 y.o. 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  #1 MSSA bacteremia with TV endocarditis with worsening TV regurgitation       [facility] Staphylococcus aureus bacteremia   Staphylococcus aureus bacteremia (SAB) is associated with a high rate of  complications and mortality.  Specific aspects of clinical management are critical to optimizing the outcome of patients with SAB.  Therefore, the [facility] Antimicrobial Management Team [facility]) has initiated an intervention aimed at improving the management of SAB at [facility].  To do so, Infectious Diseases physicians are providing an evidence-based consult for the management of all patients with SAB.     Yes No Comments  Perform follow-up blood cultures (even if the patient  is afebrile) to ensure clearance of bacteremia '[x]'  '[]'  Hopefully today will be day #5 and FINAL NO GROWTH  Remove vascular catheter and obtain follow-up blood cultures after the removal of the catheter '[]'  '[]'   she could have a central line placed tomorrow if her blood cultures are still sterile that were taken on the eighth.  Perform echocardiography to evaluate for endocarditis (transthoracic ECHO is 40-50% sensitive, TEE is > 90% sensitive) '[]'  '[]'  1.5 mm x 0.6 mm. Is size of TV vegetation now  Consult electrophysiologist to evaluate implanted cardiac device (pacemaker, ICD) '[]'  '[]'    Ensure source control '[x]'  '[]'  Have all abscesses been drained effectively? Have deep seeded infections (septic joints or osteomyelitis) had appropriate surgical debridement?  Investigate for "metastatic" sites of infection '[x]'  '[]'  Does the patient have ANY symptom or physical exam finding that would suggest a deeper infection (back or neck pain that may be suggestive of vertebral osteomyelitis or epidural abscess, muscle pain that could be a symptom of pyomyositis)?  Keep in mind that for deep seeded infections MRI imaging with contrast is preferred rather than other often insensitive tests such as plain x-rays, especially early in a patient's presentation.  Change antibiotic therapy to cefazolin  '[]'  '[]'  Beta-lactam antibiotics are preferred for MSSA due to higher cure rates.   If on Vancomycin, goal trough should be 15 - 20 mcg/mL  Estimated duration of IV antibiotic therapy:  6 weeks '[]'  '[]'  Consult case management for probably prolonged outpatient IV antibiotic therapy    Endocarditis Evaluation   Infective endocarditis (IE) is associated with a high rate of complications and mortality.  Specific aspects of clinical management are critical to optimizing the outcome of patients with IE. Therefore, the Pharmacy Residency Program at [facility] has initiated an evidence-based consult aimed at improving the management of IE at  [facility].     Comments  Consult to ID Outpatient follow-up appointment to be made prior to discharge.  Utilization of ID recommended antibiotic therapy   Surgery Evaluation Cardiothoracic surgery consult if the following indications are present:  - Heart failure associated to valve dysfunction - Endocarditis complicated by heart block or aortic abscess - Left-sided endocarditis caused by S. aureus, fungal, or multi-drug resistant organisms - Left-sided endocarditis with severe valve dysfunction - Large vegetation (>1 cm) on aortic or mitral valve - Persistence of infection 5-7 days after initiation of appropriate antibiotic therapy - Recurrent emboli and persistent vegetations despite appropriate antibiotic therapy - Any history with left-sided embolization with persistent vegetations on TEE  Consult electrophysiologist if presence of implanted cardiac device (pacemaker, ICD)   Consult to cardiology if left ventricular ejection fraction is <40% Evaluation for appropriate heart failure medications upon discharge: - ACE/ARB - Diuretic - Beta blocker  Outpatient follow-up appointment to be made prior to discharge.   Based on theis ID evaluation of this patient, the following are recommended to complete a thorough evaluation and care plan to reflect guideline-recommended therapy CT surgery consultation and outpatient ID followup.  One could talk  to CT surgery per this guideline but they are not going to operate on her. 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 Vancomycin Other (See Comments)    Possible contributor to AKI and thrombocytopenia    OPAT Orders Discharge antibiotics: Cefazolin per pharmacy  Duration: 6 weeks  End Date:  May 19th, 2020  Seltzer Per  Protocol:   Labs Weekly while on IV antibiotics: _x_ CBC with differential _x_ BMP w GFR   x__ Please pull PIC at completion of IV antibiotics __ Please leave PIC in place until doctor has seen patient or been notified  Fax weekly labs to [phone]  Clinic Follow Up Appt:  June 3rd, at 1045 am EVISIT with Dr. Tommy Medal  I will sign off for now  Please call with further questions.       LOS: 9 days   Molly [name] 01/29/2019, 11:07 AM

## 2019-01-29 NOTE — Progress Notes (Signed)
NAME:  Molly [name], MRN:  [id number], DOB:  [birth year], LOS: 9 ADMISSION DATE:  01/20/2019, CONSULTATION DATE: 01/20/2019 REFERRING MD:  Bonner Puna, CHIEF COMPLAINT:  Sepsis  Brief History    33 year old white female with a history of IV drug abuse/polysubstance abuse (presented with urine drug screen positive for opiates and amphetamines ) and a previous history in June or July 2019 of inadequately treated MRSA endocarditis having signed out AMA at that time.  She had a tricuspid vegetation at that time.   She presented 4/4 hypotensive and in respiratory distress requiring intubation at [facility] long.   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 Following commands. Awake. Looks best in many days. However, with SBT - tachypneic and RR 37. On scheduled methadone,.Ongoing fevers + 101F   4/12 - failed sbt  Yesterday. AKI better. Going to get PRBC  Today for anemia of critical illness  + 5.6L since admit. LEss alert on precedex and methadone  Consults:  nephrology  Procedures:  4/4 ETT >>  R  femoral line 4/4 >>4/8   Significant Diagnostic Tests:  Echocardiogram 4/4 >> TV vegetation measures 1.5 mm x 0.6 mm., severe TR CT scan of the chest4/4 Bilateral pulmonary nodules, many of which are peripheral and cavitated.    Micro Data:  Blood cultures x2 4/4 >>MSSA  [facility] 4/8 >>  Antimicrobials:  Vancomycin Zosyn Teflaro >> stopped ANcef 4/4 >>  Interim history/subjective:    4/13 - more alert, follows commands. On precedex. Failed SBT. Diuresing +9L since admit . ID recommending PICC for antibiotcs. Crea better. S/p 1 unit prbc this AM and 1 unit yesterday  Objective   Blood pressure 105/75, pulse 83, temperature 99.9 F (37.7 C), resp. rate 16, height 5\' 1"  (1.549 m), weight 55.4 kg, SpO2 100 %.    Vent Mode: PRVC FiO2 (%):  [30 %] 30 % Set Rate:  [15 bmp] 15 bmp Vt Set:  [450 mL] 450 mL PEEP:  [5 cmH20] 5 cmH20 Plateau Pressure:  [17 cmH20-18 cmH20] 18 cmH20   Intake/Output Summary (Last 24 hours) at 01/29/2019 1101 Last data filed at 01/29/2019 0947 Gross per 24 hour  Intake 1850.62 ml  Output 1100 ml  Net 750.62 ml   Filed Weights   01/27/19 0500 01/28/19 0453 01/29/19 0423  Weight: 52.1 kg 52.2 kg 55.4 kg    General Appearance:  Looks criticall ill Head:  Normocephalic, without obvious abnormality, atraumatic Eyes:  PERRL - yes, conjunctiva/corneas - muddy     Ears:  Normal external ear canals, both ears Nose:  G tube - n Throat:  ETT TUBE - yes , OG  tube - yes Neck:  Supple,  No enlargement/tenderness/nodules Lungs: Clear to auscultation bilaterally, Ventilator   Synchrony - yes Heart:  S1 and S2 normal, no murmur, CVP - bni.  Pressors - bi Abdomen:  Soft, no masses, no organomegaly Genitalia / Rectal:  Not done Extremities:  Extremities- intact Skin:  ntact in exposed areas . Sacral area - not examined Neurologic:  Sedation - precedex gtt -> RASS - -3        LABS    PULMONARY No results for input(s): PHART, PCO2ART, PO2ART, HCO3, TCO2,  O2SAT in the last 168 hours.  Invalid input(s): PCO2, PO2  CBC Recent Labs  Lab 01/28/19 0340  01/28/19 1846 01/29/19 0234 01/29/19 0526  HGB 6.2*   < > 6.8* 10.3* 7.5*  HCT 20.2*   < > 21.9* 31.5* 23.9*  WBC 3.4*  --   --  3.1* 4.7  PLT 37*  --   --  37* 58*   < > = values in this interval not displayed.    COAGULATION No results for input(s): INR in the last 168 hours.  CARDIAC  No results for input(s): TROPONINI in the last 168 hours. No results for input(s): PROBNP in the last 168 hours.   CHEMISTRY Recent Labs  Lab 01/23/19 0509 01/23/19 1700  01/25/19 0453 01/26/19 0800 01/27/19 0400 01/28/19 0340 01/29/19 0234  NA 138  --    < > 138 141 142 144 144  K 4.9  --    < > 4.6 4.3 4.2 4.5 4.7  CL 99  --    < > 100 103 101 105 105  CO2 27  --    < > 21* 25 24 28 28   GLUCOSE 169*  --    < > 134* 137* 137* 137* 138*  BUN 144*  --    < > 155* 157* 160* 159* 162*  CREATININE 7.28*  --    < > 6.59* 5.69* 4.73* 4.18* 4.03*  CALCIUM 6.1*  --    < > 6.5* 6.7* 6.9* 6.9* 6.6*  MG 2.3 2.3  --   --   --  2.1 2.2 2.1  PHOS 5.3* 5.2*  --   --   --  8.8* 8.8* 9.1*   < > = values in this interval not displayed.   Estimated Creatinine Clearance: 15.3 mL/min (A) (by C-G formula based on SCr of 4.03 mg/dL (H)).   LIVER Recent Labs  Lab 01/28/19 0340  AST 23  ALT <5  ALKPHOS 120  BILITOT 0.6  PROT 6.5  ALBUMIN <1.0*     INFECTIOUS Recent Labs  Lab 01/28/19 0340  LATICACIDVEN 1.3     ENDOCRINE CBG (last 3)  Recent Labs    01/28/19 2129 01/29/19 0051 01/29/19 0747  GLUCAP 135* 124* 119*         IMAGING x48h  - image(s) personally visualized  -   highlighted in bold Dg Chest Port 1 View  Result Date: 01/29/2019 CLINICAL DATA:  Endotracheal tube present. EXAM: PORTABLE CHEST 1 VIEW COMPARISON:  Chest x-ray from yesterday. FINDINGS: Unchanged endotracheal and enteric tubes. Bilateral basilar predominant airspace disease is unchanged. Cavitary nodules in  the peripheral right upper lobe and left lung base are unchanged. No pneumothorax. No acute osseous abnormality. IMPRESSION: 1. Unchanged bilateral basilar predominant airspace disease and scattered cavitary nodules. Electronically Signed   By: Titus Dubin M.D.   On: 01/29/2019 07:56   Dg Chest Port 1 View  Result Date: 01/28/2019  CLINICAL DATA:  Endotracheal tube position EXAM: PORTABLE CHEST 1 VIEW COMPARISON:  01/24/2019 FINDINGS: Endotracheal tube in good position.  NG in the stomach. Progression of diffuse bilateral airspace disease basilar predominant. Probable small bilateral effusions. No pneumothorax. IMPRESSION: Endotracheal tube in good position. Progression of diffuse bilateral airspace disease with basilar predominance. Electronically Signed   By: Franchot Gallo M.D.   On: 01/28/2019 08:12     Resolved Hospital Problem list     ASSESSMENT / PLAN:  PULMONARY A:  Acute resp failure due to MSSA septic shock in IVDA  01/29/2019 - > fails SBT due to pulmonarhy infiltrates P:   PRVC HOB > 30 degrees Likely needs trach  CARDIAC A: Septic shock/MRSA bacteremia Infective endocarditis/tricuspid valve  -01/29/2019 - not on pressors P: Not a surgical candidate    RENAL A:  AKI -not a cvvh candidate per renal  01/29/2019 - improving but volume overloaded  P:  Lasix scheduled - accept sbp > 90     GASTROINTESTINAL A:   og tube  P:   Ct TFs  HEMATOLOGIC A:  Anemia critical illness - S/p 1 U PRBC 4/7, 4/8 Severe thrombocytopenia  01/29/2019 - hgb M 7gm% again . Low platelets recovering  P:  - PRBC for hgb </= 6.9gm%   - repeat 01/28/2019 Transfuse for platelets less than 10, expect to recover soon     INFECTIOUS A:   MSSA endocardiditis P:   Ct ANcef x 6 weeks from 4/8 if cultures from that date negative Place picc line   ENDOCRINE A:   At risk hyperglycemia   P:   ssi  NEUROLOGIC A:   Intermittent agitation, head CT negative P:   MRI  brain/spine deferred due to poor prognosis Methadone 20  Daily (QTc 442 msec 01/28/2019) Goal RA SS 0 Versed prn precedex  Summary -she seems to be improving, bacteremia has cleared and kidneys are recovering, will need 6 weeks of antibiotics for endocarditis, has severe TR so remains to be seen whether she will liberate from the vent but hopeful No CPR as d/w mom, improved prognosis given     Best practice:  Diet: npo start tF Pain/Anxiety/Delirium protocol (if indicated): start precedex gtt to facilitate extubation VAP protocol (if indicated): probable mrsa septic emboli DVT prophylaxis: scd GI prophylaxis: protocol Glucose control: protocal Mobility: sedated Code Status: Limited code Family Communication: -   Per discussion 4/6- no cvvh, no cpr, no mri - continue full medical care otherwise. Updated 4/8. On 4/11 - LMTCB. On 01/28/2019 - mom's cell appears deead. RN also reports difficulty getting hold of mom   Disposition: icu    ATTESTATION & SIGNATURE   The patient Molly [name] is critically ill with multiple organ systems failure and requires high complexity decision making for assessment and support, frequent evaluation and titration of therapies, application of advanced monitoring technologies and extensive interpretation of multiple databases.   Critical Care Time devoted to patient care services described in this note is  30  Minutes. This time reflects time of care of this signee Dr Brand Males. This critical care time does not reflect procedure time, or teaching time or supervisory time of PA/NP/Med student/Med Resident etc but could involve care discussion time     Dr. Brand Males, M.D., [facility].C.P Pulmonary and Critical Care Medicine Staff Physician [facility] Pager: [phone], If no answer or between  15:00h - 7:00h: call [phone]  01/29/2019  11:01 AM

## 2019-01-30 ENCOUNTER — Inpatient Hospital Stay (HOSPITAL_COMMUNITY): Payer: Medicaid Other

## 2019-01-30 LAB — CBC WITH DIFFERENTIAL/PLATELET
Abs Immature Granulocytes: 0.01 10*3/uL (ref 0.00–0.07)
Basophils Absolute: 0 10*3/uL (ref 0.0–0.1)
Basophils Relative: 0 %
Eosinophils Absolute: 0 10*3/uL (ref 0.0–0.5)
Eosinophils Relative: 0 %
HCT: 22 % — ABNORMAL LOW (ref 36.0–46.0)
Hemoglobin: 6.8 g/dL — CL (ref 12.0–15.0)
Immature Granulocytes: 0 %
Lymphocytes Relative: 14 %
Lymphs Abs: 0.5 10*3/uL — ABNORMAL LOW (ref 0.7–4.0)
MCH: 27.3 pg (ref 26.0–34.0)
MCHC: 30.9 g/dL (ref 30.0–36.0)
MCV: 88.4 fL (ref 80.0–100.0)
Monocytes Absolute: 0.2 10*3/uL (ref 0.1–1.0)
Monocytes Relative: 5 %
Neutro Abs: 2.7 10*3/uL (ref 1.7–7.7)
Neutrophils Relative %: 81 %
Platelets: 71 10*3/uL — ABNORMAL LOW (ref 150–400)
RBC: 2.49 MIL/uL — ABNORMAL LOW (ref 3.87–5.11)
RDW: 15.3 % (ref 11.5–15.5)
WBC: 3.3 10*3/uL — ABNORMAL LOW (ref 4.0–10.5)
nRBC: 0 % (ref 0.0–0.2)

## 2019-01-30 LAB — BASIC METABOLIC PANEL
Anion gap: 12 (ref 5–15)
BUN: 157 mg/dL — ABNORMAL HIGH (ref 6–20)
CO2: 26 mmol/L (ref 22–32)
Calcium: 6.6 mg/dL — ABNORMAL LOW (ref 8.9–10.3)
Chloride: 105 mmol/L (ref 98–111)
Creatinine, Ser: 3.6 mg/dL — ABNORMAL HIGH (ref 0.44–1.00)
GFR calc Af Amer: 18 mL/min — ABNORMAL LOW (ref >60)
GFR calc non Af Amer: 16 mL/min — ABNORMAL LOW (ref >60)
Glucose, Bld: 137 mg/dL — ABNORMAL HIGH (ref 70–99)
Potassium: 4.3 mmol/L (ref 3.5–5.1)
Sodium: 143 mmol/L (ref 135–145)

## 2019-01-30 LAB — PREPARE RBC (CROSSMATCH)

## 2019-01-30 LAB — MAGNESIUM: Magnesium: 2.1 mg/dL (ref 1.7–2.4)

## 2019-01-30 LAB — HEMOGLOBIN AND HEMATOCRIT, BLOOD
HCT: 26 % — ABNORMAL LOW (ref 36.0–46.0)
Hemoglobin: 8 g/dL — ABNORMAL LOW (ref 12.0–15.0)

## 2019-01-30 LAB — GLUCOSE, CAPILLARY
Glucose-Capillary: 131 mg/dL — ABNORMAL HIGH (ref 70–99)
Glucose-Capillary: 112 mg/dL — ABNORMAL HIGH (ref 70–99)
Glucose-Capillary: 123 mg/dL — ABNORMAL HIGH (ref 70–99)
Glucose-Capillary: 121 mg/dL — ABNORMAL HIGH (ref 70–99)
Glucose-Capillary: 123 mg/dL — ABNORMAL HIGH (ref 70–99)
Glucose-Capillary: 122 mg/dL — ABNORMAL HIGH (ref 70–99)

## 2019-01-30 LAB — PHOSPHORUS: Phosphorus: 8.8 mg/dL — ABNORMAL HIGH (ref 2.5–4.6)

## 2019-01-30 MED ORDER — SODIUM CHLORIDE 0.9% IV SOLUTION
Freq: Once | INTRAVENOUS | Status: AC
  Administered 2019-01-30: 06:00:00 via INTRAVENOUS

## 2019-01-30 MED ORDER — METHADONE HCL 5 MG PO TABS
15.0000 mg | ORAL_TABLET | Freq: Every day | ORAL | Status: DC
  Administered 2019-01-30 – 2019-01-31 (×2): 15 mg
  Filled 2019-01-30 (×2): qty 3

## 2019-01-30 NOTE — Progress Notes (Addendum)
NAME:  Molly [name], MRN:  [id number], DOB:  01/13/1986, LOS: 17 ADMISSION DATE:  01/20/2019, CONSULTATION DATE: 01/20/2019 REFERRING MD:  Bonner Puna, CHIEF COMPLAINT:  Sepsis  Brief History   33 y/o F with a history of IV drug abuse/polysubstance abuse (presented with urine drug screen positive for opiates and amphetamines) and a previous history in June or July 2019 of inadequately treated MRSA endocarditis having signed out AMA at that time.  She had a tricuspid vegetation at that time.  Admitted 4/4 hypotensive and in respiratory distress requiring intubation at [facility] long.   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 Awake / FC. Looks best in many days. Failed SBT 4/12 Failed SBT.  AKI improving, tx PRBC.  Less alert on precedex, methadone  4/13 More alert, follows commands. On precedex. Failed SBT. Diuresing +9L since admit . ID recommending PICC for antibiotcs > mother not sure about goals of care. S/p 1 unit PRBC    Consults:  Nephrology  Procedures:  ETT 4/4 >>  R Femoral line 4/4 >> 4/8  Significant Diagnostic Tests:  Echocardiogram 4/4 >> TV vegetation measures 1.5 mm x 0.6 mm., severe TR CT scan of the chest4/4 Bilateral pulmonary nodules, many of which are peripheral and cavitated.   Micro Data:  Blood cultures x2 4/4 >> MSSA  BCx2 4/8 >> negative    Antimicrobials:  Vancomycin Zosyn  Teflaro >> stopped Ancef 4/4 >>  Interim history/subjective:  No acute events overnight.  Tmax 99.3.  Transfused for Hbg <7.    Objective   Blood pressure 100/72, pulse 83, temperature 99.3 F (37.4 C), resp. rate 15, height 5\' 1"  (1.549 m), weight 56 kg, SpO2 100 %.    Vent Mode: PRVC FiO2 (%):  [30 %] 30 % Set Rate:  [15 bmp] 15 bmp Vt Set:  [450 mL] 450 mL PEEP:  [5 cmH20] 5 cmH20 Plateau Pressure:  [15 cmH20-18 cmH20] 15 cmH20   Intake/Output Summary (Last 24 hours) at 01/30/2019 1212 Last data filed at 01/30/2019 1200 Gross per 24 hour  Intake 2573.76 ml  Output 2010 ml  Net 563.76 ml   Filed Weights   01/28/19 0453 01/29/19 0423 01/30/19 0417  Weight: 52.2 kg 55.4 kg 56 kg    General: thin, critically ill appearing adult female  HEENT: MM pink/moist, ETT, small abrasions to face Neuro: Awakens, sedate  CV: s1s2 rrr, no m/r/g PULM: even/non-labored, lungs bilaterally clear WU:[id number], non-tender, bsx4 active  Extremities: warm/dry, UE 1+ edema  Skin: no rashes or lesions, scattered lesions on arms, right toes with abrasion/discoloration  LABS    PULMONARY No results for input(s): PHART, PCO2ART, PO2ART, HCO3, TCO2, O2SAT in the last 168 hours.  Invalid input(s): PCO2, PO2  CBC Recent Labs  Lab 01/29/19 0234 01/29/19 0526 01/30/19 0238 01/30/19 1107  HGB 10.3* 7.5* 6.8* 8.0*  HCT 31.5* 23.9* 22.0* 26.0*  WBC 3.1* 4.7 3.3*  --   PLT 37* 58* 71*  --  COAGULATION No results for input(s): INR in the last 168 hours.  CARDIAC  No results for input(s): TROPONINI in the last 168 hours. No results for input(s): PROBNP in the last 168 hours.   CHEMISTRY Recent Labs  Lab 01/23/19 1700  01/26/19 0800 01/27/19 0400 01/28/19 0340 01/29/19 0234 01/30/19 0238  NA  --    < > 141 142 144 144 143  K  --    < > 4.3 4.2 4.5 4.7 4.3  CL  --    < > 103 101 105 105 105  CO2  --    < > 25 24 28 28 26   GLUCOSE  --    < > 137* 137* 137* 138* 137*  BUN  --    < >  157* 160* 159* 162* 157*  CREATININE  --    < > 5.69* 4.73* 4.18* 4.03* 3.60*  CALCIUM  --    < > 6.7* 6.9* 6.9* 6.6* 6.6*  MG 2.3  --   --  2.1 2.2 2.1 2.1  PHOS 5.2*  --   --  8.8* 8.8* 9.1* 8.8*   < > = values in this interval not displayed.   Estimated Creatinine Clearance: 17.1 mL/min (A) (by C-G formula based on SCr of 3.6 mg/dL (H)).   LIVER Recent Labs  Lab 01/28/19 0340  AST 23  ALT <5  ALKPHOS 120  BILITOT 0.6  PROT 6.5  ALBUMIN <1.0*     INFECTIOUS Recent Labs  Lab 01/28/19 0340  LATICACIDVEN 1.3     ENDOCRINE CBG (last 3)  Recent Labs    01/30/19 0318 01/30/19 0739 01/30/19 1148  GLUCAP 131* 112* 123*    IMAGING   Dg Chest Port 1 View  Result Date: 01/30/2019 CLINICAL DATA:  Endotracheal tube present.  Respiratory failure. EXAM: PORTABLE CHEST 1 VIEW COMPARISON:  One-view chest x-ray 01/29/2019 FINDINGS: The endotracheal tube terminates 3.2 cm above the carina, in satisfactory position. NG tube courses off the inferior border of the film. The heart is exaggerated by low lung volumes. Interstitial and airspace disease is present bilaterally, worse at the base. There is no significant interval change. Bilateral effusions are suspected. IMPRESSION: 1. Stable bilateral interstitial and airspace disease volume both lung bases. 2. Probable bilateral pleural effusions. 3. Support apparatus is stable. Electronically Signed   By: San Morelle M.D.   On: 01/30/2019 06:39   Dg Chest Port 1 View  Result Date: 01/29/2019 CLINICAL DATA:  Endotracheal tube present. EXAM: PORTABLE CHEST 1 VIEW COMPARISON:  Chest x-ray from yesterday. FINDINGS: Unchanged endotracheal and enteric tubes. Bilateral basilar predominant airspace disease is unchanged. Cavitary nodules in the peripheral right upper lobe and left lung base are unchanged. No pneumothorax. No acute osseous abnormality. IMPRESSION: 1. Unchanged bilateral basilar predominant airspace disease and scattered  cavitary nodules. Electronically Signed   By: Titus Dubin M.D.   On: 01/29/2019 07:56   [place] Ekg Site Rite  Result Date: 01/29/2019 If Site Rite image not attached, placement could not be confirmed due to current cardiac rhythm.   Resolved Hospital Problem list     Acute Hypoxemic Respiratory Failure secondary to MSSA Bacteremia, Septic Shock in the setting of IVDA P: PRVC 8cc/kg  Wean PEEP / FiO2 for sats >90% Follow intermittent CXR Await family decision regarding trach status > mother unsure if she wants to pursue trach given daughters history  Daily SBT / WUA   Septic Shock in setting of MSSA Bacteremia  Infective Endocarditis  -  abx started 4/8 Severe Tricuspid Regurgitation  P: ICU monitoring  Continue Cefazolin Monitor fever curve / WBC trend  Plan to continue abx for 6 weeks Consider PICC, ID requesting for abx.  Will need to discuss plan / [place] with mother. LCB - no CPR   AKI -not an HD candidate  P: Free water PT  Trend BMP / urinary output Replace electrolytes as indicated Avoid nephrotoxic agents, ensure adequate renal perfusion  At Risk Malnutrition  P: Continue TF per Nutrition  Anemia  -of critical illness Thrombocytopenia  P: Trend CBC  Transfuse per ICU guidelines   Intermittent Agitation  -CT head negative -MRI brain / spine deferred  P:   Continue methadone, reduce to 15 mg QD RASS Goal: 0 to -1 Precedex     Best practice:  Diet: TF Pain/Anxiety/Delirium protocol (if indicated): precedex VAP protocol (if indicated): in place DVT prophylaxis: scd GI prophylaxis: PPI Glucose control: protocal Mobility: as tolerated Code Status: Limited code Family Communication: - prior discussion 4/6- no cvvh, no cpr, no mri - continue full medical care otherwise. Updated 4/8. On 4/11 - LMTCB. On 2019/02/25 - mom's cell appears dead. RN also reports difficulty getting hold of mom Disposition: ICU   CC Time: 61 minutes     Noe Gens, NP-C  [place] [facility] Pgr: [phone] or if no answer [phone] 01/30/2019, 1:57 PM

## 2019-01-30 NOTE — Progress Notes (Signed)
CRITICAL VALUE ALERT  Critical Value:  Hgb 6.8  Date & Time Notied:  01/30/2019  0355  Provider Notified: Dr. Prudencio Burly  Orders Received/Actions taken: Will transfuse 1u PRBC's.  Milford Cage, RN

## 2019-01-30 NOTE — Progress Notes (Signed)
eLink Physician-Brief Progress Note Patient Name: Molly [name] DOB: [birth year] MRN: [id number]   Date of Service  01/30/2019  HPI/Events of Note  Hg< 7. ID note seen. CVTS not taking her for valave repair. DIC. Stable platelet count.   eICU Interventions  PRBC ordered.      Intervention Category Intermediate Interventions: Coagulopathy - evaluation and management;Thrombocytopenia - evaluation and management  Elmer Sow 01/30/2019, 4:04 AM

## 2019-01-31 DIAGNOSIS — Z7189 Other specified counseling: Secondary | ICD-10-CM

## 2019-01-31 LAB — TYPE AND SCREEN
ABO/RH(D): O NEG
Antibody Screen: NEGATIVE
Unit division: 0
Unit division: 0
Unit division: 0

## 2019-01-31 LAB — CBC WITH DIFFERENTIAL/PLATELET
Abs Immature Granulocytes: 0.01 10*3/uL (ref 0.00–0.07)
Basophils Absolute: 0 10*3/uL (ref 0.0–0.1)
Basophils Relative: 0 %
Eosinophils Absolute: 0 10*3/uL (ref 0.0–0.5)
Eosinophils Relative: 0 %
HCT: 24.3 % — ABNORMAL LOW (ref 36.0–46.0)
Hemoglobin: 7.7 g/dL — ABNORMAL LOW (ref 12.0–15.0)
Immature Granulocytes: 0 %
Lymphocytes Relative: 11 %
Lymphs Abs: 0.4 10*3/uL — ABNORMAL LOW (ref 0.7–4.0)
MCH: 27.9 pg (ref 26.0–34.0)
MCHC: 31.7 g/dL (ref 30.0–36.0)
MCV: 88 fL (ref 80.0–100.0)
Monocytes Absolute: 0.2 10*3/uL (ref 0.1–1.0)
Monocytes Relative: 5 %
Neutro Abs: 2.9 10*3/uL (ref 1.7–7.7)
Neutrophils Relative %: 84 %
Platelets: 78 10*3/uL — ABNORMAL LOW (ref 150–400)
RBC: 2.76 MIL/uL — ABNORMAL LOW (ref 3.87–5.11)
RDW: 16.2 % — ABNORMAL HIGH (ref 11.5–15.5)
WBC: 3.5 10*3/uL — ABNORMAL LOW (ref 4.0–10.5)
nRBC: 0 % (ref 0.0–0.2)

## 2019-01-31 LAB — BASIC METABOLIC PANEL
Anion gap: 12 (ref 5–15)
BUN: 155 mg/dL — ABNORMAL HIGH (ref 6–20)
CO2: 30 mmol/L (ref 22–32)
Calcium: 6.8 mg/dL — ABNORMAL LOW (ref 8.9–10.3)
Chloride: 103 mmol/L (ref 98–111)
Creatinine, Ser: 3.51 mg/dL — ABNORMAL HIGH (ref 0.44–1.00)
GFR calc Af Amer: 19 mL/min — ABNORMAL LOW (ref >60)
GFR calc non Af Amer: 16 mL/min — ABNORMAL LOW (ref >60)
Glucose, Bld: 131 mg/dL — ABNORMAL HIGH (ref 70–99)
Potassium: 3 mmol/L — ABNORMAL LOW (ref 3.5–5.1)
Sodium: 145 mmol/L (ref 135–145)

## 2019-01-31 LAB — GLUCOSE, CAPILLARY
Glucose-Capillary: 106 mg/dL — ABNORMAL HIGH (ref 70–99)
Glucose-Capillary: 111 mg/dL — ABNORMAL HIGH (ref 70–99)
Glucose-Capillary: 113 mg/dL — ABNORMAL HIGH (ref 70–99)
Glucose-Capillary: 114 mg/dL — ABNORMAL HIGH (ref 70–99)
Glucose-Capillary: 118 mg/dL — ABNORMAL HIGH (ref 70–99)
Glucose-Capillary: 95 mg/dL (ref 70–99)

## 2019-01-31 LAB — BPAM RBC
Blood Product Expiration Date: 202004152359
Blood Product Expiration Date: 202004152359
Blood Product Expiration Date: 202004162359
ISSUE DATE / TIME: 202004121012
ISSUE DATE / TIME: 202004122129
ISSUE DATE / TIME: 202004140505
Unit Type and Rh: 9500
Unit Type and Rh: 9500
Unit Type and Rh: 9500

## 2019-01-31 LAB — MAGNESIUM: Magnesium: 2.2 mg/dL (ref 1.7–2.4)

## 2019-01-31 LAB — PHOSPHORUS: Phosphorus: 7.7 mg/dL — ABNORMAL HIGH (ref 2.5–4.6)

## 2019-01-31 MED ORDER — POTASSIUM CHLORIDE 20 MEQ/15ML (10%) PO SOLN
ORAL | Status: AC
  Administered 2019-01-31: 40 meq via ORAL
  Filled 2019-01-31: qty 30

## 2019-01-31 MED ORDER — METHADONE HCL 5 MG PO TABS
10.0000 mg | ORAL_TABLET | Freq: Every day | ORAL | Status: DC
  Administered 2019-02-01 – 2019-02-02 (×2): 10 mg
  Filled 2019-01-31 (×2): qty 2

## 2019-01-31 MED ORDER — SODIUM CHLORIDE 0.9 % IV SOLN
INTRAVENOUS | Status: DC | PRN
  Administered 2019-02-05: 1000 mL via INTRAVENOUS
  Administered 2019-02-14 – 2019-02-17 (×3): 250 mL via INTRAVENOUS

## 2019-01-31 MED ORDER — FUROSEMIDE 10 MG/ML IJ SOLN
40.0000 mg | Freq: Two times a day (BID) | INTRAMUSCULAR | Status: DC
  Administered 2019-01-31 – 2019-02-01 (×2): 40 mg via INTRAVENOUS
  Filled 2019-01-31 (×2): qty 4

## 2019-01-31 MED ORDER — METOCLOPRAMIDE HCL 5 MG/ML IJ SOLN
5.0000 mg | Freq: Once | INTRAMUSCULAR | Status: AC
  Administered 2019-01-31: 5 mg via INTRAVENOUS
  Filled 2019-01-31: qty 2

## 2019-01-31 MED ORDER — POTASSIUM CHLORIDE 20 MEQ/15ML (10%) PO SOLN
40.0000 meq | Freq: Two times a day (BID) | ORAL | Status: DC
  Administered 2019-01-31 – 2019-02-02 (×5): 40 meq via ORAL
  Filled 2019-01-31 (×4): qty 30

## 2019-01-31 MED ORDER — POTASSIUM CHLORIDE CRYS ER 20 MEQ PO TBCR
40.0000 meq | EXTENDED_RELEASE_TABLET | Freq: Two times a day (BID) | ORAL | Status: DC
  Filled 2019-01-31: qty 2

## 2019-01-31 NOTE — Progress Notes (Signed)
NAME:  Molly [name], MRN:  [id number], DOB:  1986-06-07, LOS: 73 ADMISSION DATE:  01/20/2019, CONSULTATION DATE: 01/20/2019 REFERRING MD:  Bonner Puna, CHIEF COMPLAINT:  Sepsis  Brief History   33 y/o F with a history of IV drug abuse/polysubstance abuse (presented with urine drug screen positive for opiates and amphetamines) and a previous history in June or July 2019 of inadequately treated MRSA endocarditis having signed out AMA at that time.  She had a tricuspid vegetation at that time.  Admitted 4/4 hypotensive and in respiratory distress requiring intubation at [facility] long.   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 Awake / FC. Looks best in many days. Failed SBT 4/12 Failed SBT.  AKI improving, tx PRBC.  Less alert on precedex, methadone  4/13 More alert, follows commands. On precedex. Failed SBT. Diuresing +9L since admit . ID recommending PICC for antibiotcs > mother not sure about goals of care. S/p 1 unit PRBC   4/14-  No acute events overnight.  Tmax 99.3.  Transfused for Hbg <7.     Consults:  Nephrology  Procedures:  ETT 4/4 >>  R Femoral line 4/4 >> 4/8  Significant Diagnostic Tests:  Echocardiogram 4/4 >> TV vegetation measures 1.5 mm x 0.6 mm., severe TR CT scan of the chest4/4 Bilateral pulmonary nodules, many of which are peripheral and cavitated.   Micro Data:  Blood cultures x2 4/4  >> MSSA  BCx2 4/8 >> negative    Antimicrobials:  Vancomycin Zosyn Teflaro >> stopped Ancef 4/4 >>  Interim history/subjective:    4/15 - follows commands and appears to be cognitive of surroundings. Per RN - mom unsure about going tracheostromy route and  Equivocal . Just vomitted x 1  Objective   Blood pressure 121/89, pulse 70, temperature 98.4 F (36.9 C), resp. rate 17, height 5\' 1"  (1.549 m), weight 56 kg, SpO2 94 %.    Vent Mode: PRVC FiO2 (%):  [30 %] 30 % Set Rate:  [15 bmp] 15 bmp Vt Set:  [450 mL] 450 mL PEEP:  [5 cmH20] 5 cmH20 Plateau Pressure:  [16 cmH20-18 cmH20] 17 cmH20   Intake/Output Summary (Last 24 hours) at 01/31/2019 1419 Last data filed at 01/31/2019 1400 Gross per 24 hour  Intake 1934.74 ml  Output 2320 ml  Net -385.26 ml   Filed Weights   01/29/19 0423 01/30/19 0417 01/31/19 0500  Weight: 55.4 kg 56 kg 56 kg   General Appearance:  Looks criticall ill Head:  Normocephalic, without obvious abnormality, atraumatic Eyes:  PERRL - yes, conjunctiva/corneas - muddy     Ears:  Normal external ear canals, both ears Nose:  G tube - no Throat:  ETT TUBE - yes , OG tube - yes with TF Neck:  Supple,  No enlargement/tenderness/nodules Lungs: Clear to auscultation bilaterally, Ventilator   Synchrony - yes Heart:  S1 and S2 normal, no murmur, CVP - no.  Pressors - no Abdomen:  Soft, no masses,  no organomegaly Genitalia / Rectal:  Not done Extremities:  Extremities- intact with excoriations and marks Skin:  ntact in exposed areas .  Neurologic:  Sedation - precedex -> RASS - +! Marland Kitchen Moves all 4s - yes, VERY WEAK. CAM-ICU - follows commands      LABS    PULMONARY No results for input(s): PHART, PCO2ART, PO2ART, HCO3, TCO2, O2SAT in the last 168 hours.  Invalid input(s): PCO2, PO2  CBC Recent Labs  Lab 01/29/19 0526 01/30/19 0238 01/30/19 1107 01/31/19 0411  HGB 7.5* 6.8* 8.0* 7.7*  HCT 23.9* 22.0* 26.0* 24.3*  WBC 4.7 3.3*  --  3.5*  PLT 58*  71*  --  78*    COAGULATION No results for input(s): INR in the last 168 hours.  CARDIAC  No results for input(s): TROPONINI in the last 168 hours. No results for input(s): PROBNP in the last 168 hours.   CHEMISTRY Recent Labs  Lab 01/26/19 0800 01/27/19 0400 01/28/19 0340 01/29/19 0234 01/30/19 0238 01/31/19 0411  NA 141 142 144 144 143  --   K 4.3 4.2 4.5 4.7 4.3  --   CL 103 101 105 105 105  --   CO2 25 24 28 28 26   --   GLUCOSE 137* 137* 137* 138* 137*  --   BUN 157* 160* 159* 162* 157*  --   CREATININE 5.69* 4.73* 4.18* 4.03* 3.60*  --   CALCIUM 6.7* 6.9* 6.9* 6.6* 6.6*  --   MG  --  2.1 2.2 2.1 2.1 2.2  PHOS  --  8.8* 8.8* 9.1* 8.8* 7.7*   Estimated Creatinine Clearance: 17.1 mL/min (A) (by C-G formula based on SCr of 3.6 mg/dL (H)).   LIVER Recent Labs  Lab 01/28/19 0340  AST 23  ALT <5  ALKPHOS 120  BILITOT 0.6  PROT 6.5  ALBUMIN <1.0*     INFECTIOUS Recent Labs  Lab 01/28/19 0340  LATICACIDVEN 1.3     ENDOCRINE CBG (last 3)  Recent Labs    01/31/19 0321 01/31/19 0746 01/31/19 1101  GLUCAP 111* 113* 118*    IMAGING   Dg Chest Port 1 View  Result Date: 01/30/2019 CLINICAL DATA:  Endotracheal tube present.  Respiratory failure. EXAM: PORTABLE CHEST 1 VIEW COMPARISON:  One-view chest x-ray 01/29/2019 FINDINGS: The endotracheal tube terminates 3.2 cm above the carina, in satisfactory position. NG tube courses off the inferior border of the film. The heart is exaggerated by low lung volumes. Interstitial and airspace disease is present bilaterally, worse at the base. There is no significant interval change. Bilateral effusions are suspected. IMPRESSION: 1. Stable bilateral interstitial and airspace disease volume both lung bases. 2. Probable bilateral pleural effusions. 3. Support apparatus is stable. Electronically Signed   By: San Morelle M.D.   On: 01/30/2019 06:39    Resolved Hospital Problem list     Acute Hypoxemic  Respiratory Failure secondary to MSSA Bacteremia, Septic Shock in the setting of IVDA   01/31/2019 - > does not meet criteria for SBT/Extubation in setting of Acute Respiratory Failure due to extensive MSSA Pna and aLI and severe cachexia   P: PRVC 8cc/kg Meets indication for trach v comfort care; TRH MD has called palliative caer Hold off picc line til goals sorted    Intermittent Agitation  -CT head negative -MRI brain / spine deferred  P:   Continue methadone, reduce to 15 mg QD RASS Goal: 0 to -1 Precedex     Septic Shock in setting of  MSSA Bacteremia  Infective Endocarditis  - -abx started 4/8 Severe Tricuspid Regurgitation  P:  per triad   AKI -not an HD candidate  P: Per triad  At Risk Malnutrition  P: Continue TF per Nutrition  Anemia  -of critical illness Thrombocytopenia  P: Trend CBC  Transfuse per ICU guidelines    Best practice:  Diet: TF Pain/Anxiety/Delirium protocol (if indicated): precedex VAP protocol (if indicated): in place DVT prophylaxis: scd GI prophylaxis: PPI Glucose control: protocal Mobility: as tolerated Code Status: Limited code Family Communication: - prior discussion 4/6- no cvvh, no cpr, no mri - continue full medical care otherwise. Updated 4/8. On 4/11 - LMTCB. On Feb 22, 2019 - mom's cell appears dead. RN also reports difficulty getting hold of mom Disposition: ICU    ATTESTATION & SIGNATURE   The patient Molly [name] is critically ill with multiple organ systems failure and requires high complexity decision making for assessment and support, frequent evaluation and titration of therapies, application of advanced monitoring technologies and extensive interpretation of multiple databases.   Critical Care Time devoted to patient care services described in this note is  30  Minutes. This time reflects time of care of this signee Dr Brand Males. This critical care time does not reflect procedure time, or teaching  time or supervisory time of PA/NP/Med student/Med Resident etc but could involve care discussion time     Dr. Brand Males, M.D., [facility].C.P Pulmonary and Critical Care Medicine Staff Physician [facility] Pager: [phone], If no answer or between  15:00h - 7:00h: call [phone]  01/31/2019 2:29 PM
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
## 2019-01-31 NOTE — Progress Notes (Signed)
Assisted tele visit to patient with mother.  Molly [name], Aliene Beams, RN

## 2019-01-31 NOTE — Progress Notes (Addendum)
[facility] Daily Progress Note    NAME:  Molly [name], MRN:  [id number], DOB:  06-02-1986, LOS: 17 ADMISSION DATE:  01/20/2019 CHIEF COMPLAINT:  Confusion, malaise  Brief History/HPI   Molly [name] is a 33 y.o. 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        In the ER, hypotensive, confused, required intubation.  Blood cultures growing MSSA in 2/2.  CT chest with bilateral nodular cavitary lesions.  AKI Cr > 7, and thrombocytopenia Plts 17K, Hgb 7, in DIC.  Started on Teflaro.     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, TV endocarditis and septic emboli     Second episode June 2019, left AMA without completing therapy Substance use disorder CKD III   2014               0.7- 0.8   2017               1.51 >> 0.8      AKI #1, 46 - > 60   June 2019      3.81 >> 1.54    AKI #2, 15- 44   July 2019       2.53                 24    Significant Hospital Events   Intubated 4/4  Consults:  Pulmonology ID Nephrology  Procedures:  ETT 4/4 Central line 4/4  Significant Diagnostic Tests:  CT head 4/4 -- unremarkable CT chest w/o 4/4 - bilatearl cavitary pulmonary nodules, consistent with setic emboli, possble right base PNA, ?dilated pulmonary artery Echo 4/4 -- EF normal, tricuspid valve vegetation 1.5x0.6cm, severe TR Renal US 4/4 -- chronic renal medical disease, no hydro, no masses  Micro Data:  4/4 Blood culture x2 -- MSSA 2/2 4/4 Urine cx -- MSSA 4/8 Blood cultures x2 -- NGTD  Antimicrobials:  Zosyn 4/4 x1 Ceftaroline 4/4 x1 Vancomycin 4/4 x1 Cefazolin 4/4 >>  Interim history/subjective:  Awake and following commands.  No respiratory distress, change in  mentation.  No fever.  No new rash or focal weakness, no diarreha.      Objective   Blood pressure 111/83, pulse 71, temperature 98.2 F (36.8 C), resp. rate 15, height 5\' 1"  (1.549 m), weight 56 kg, SpO2 100 %.    Vent Mode: PRVC FiO2 (%):  [30 %] 30 % Set Rate:  [15 bmp] 15 bmp Vt Set:  [450 mL] 450 mL PEEP:  [5 cmH20] 5 cmH20 Plateau Pressure:  [16 cmH20-18 cmH20] 17 cmH20   Intake/Output Summary (Last 24 hours) at 01/31/2019 1355 Last data filed at 01/31/2019 1200 Gross per 24 hour  Intake 1834.74 ml  Output 2365 ml  Net -530.26 ml   Filed Weights   01/29/19 0423 01/30/19 0417 01/31/19 0500  Weight: 55.4 kg 56 kg 56 kg    Examination: General appearance: cachectic adult female, intubated, awake   HEENT: Anicteric, conjunctiva pale, lids and lashes normal. No nasal deformity, discharge, epistaxis. Lips dry, ETT tube in place, OP/tongue moist   Skin: Warm and dry.  Petechiae, confluent on legs.    Cardiac: tachycardic, regular,  nl I6-E7, systolic mrumur noted, sfot  Capillary refill is brisk.  JVP not visible.  moderate LE edema.  Radial pulses 2+ and symmetric. Respiratory: on ventilator, lungs without rales or wheezes MSK: Diffuse severe loss of subcutaneous muscle mass and fat Abdomen: Abdomen soft.  No TTP. No ascites, distension, hepatosplenomegaly.   Neuro/Psych: awake, moves both upper extremities with extreme weakness, symmetric.  PERRL.  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  Patient not surgical candidate. -Will discuss with CT surgery  Acute on chronic renal failure Baseline likely in the 3s prior to admission, admitted with Cr >7, resolved to 3.6 with diuresis.   Cr 3.5 today, BUN >100.  Only slightly net negative. -Continue Lasix -Daily BMP  -Strict I/Os  Acute diastolic  CHF EF 03%, but tricuspid regurgitation and net positive 15L on admission. -Continue Lasix -Daily BMP -Close monitoring electrolytes -Strict I/Os  Substance abuse disorder -Continue methadone, reduce dose due to drowsiness  Nutrition -Continue TFs -Continue Free water  Anemia of chronic disease and CKD Pancytopenia Hgb trending down, no clinical bleeding.  Platelets 78Km, stable. -Trend Hgb        Best practice:  Diet: TDs Pain/Anxiety/Delirium protocol (if indicated): In place VAP protocol (if indicated): In place DVT prophylaxis: SCDs GI prophylaxis: PPI Glucose control: Adquate Mobility: BR, PT eval Code Status: FULL Family Communication: By phone given pandemic Disposition: Will consult palliative care to engage with mother in goals of care discussions.   Labs   CBC: Recent Labs  Lab 01/28/19 0340  01/29/19 0234 01/29/19 0526 01/30/19 0238 01/30/19 1107 01/31/19 0411  WBC 3.4*  --  3.1* 4.7 3.3*  --  3.5*  NEUTROABS  --   --   --   --  2.7  --  2.9  HGB 6.2*   < > 10.3* 7.5* 6.8* 8.0* 7.7*  HCT 20.2*   < > 31.5* 23.9* 22.0* 26.0* 24.3*  MCV 88.6  --  86.3 86.9 88.4  --  88.0  PLT 37*  --  37* 58* 71*  --  78*   < > = values in this interval not displayed.    Basic Metabolic Panel: Recent Labs  Lab 01/26/19 0800 01/27/19 0400 01/28/19 0340 01/29/19 0234 01/30/19 0238 01/31/19 0411  NA 141 142 144 144 143  --   K 4.3 4.2 4.5 4.7 4.3  --   CL 103 101 105 105 105  --   CO2 25 24 28 28 26   --   GLUCOSE 137* 137* 137* 138* 137*  --   BUN 157* 160* 159* 162* 157*  --   CREATININE 5.69* 4.73* 4.18* 4.03* 3.60*  --   CALCIUM 6.7* 6.9* 6.9* 6.6* 6.6*  --   MG  --  2.1 2.2 2.1 2.1 2.2  PHOS  --  8.8* 8.8* 9.1* 8.8* 7.7*   GFR: Estimated Creatinine Clearance: 17.1 mL/min (A) (by C-G formula based on SCr of 3.6 mg/dL (H)). Recent Labs  Lab 01/28/19 0340 01/29/19 0234 01/29/19 0526 01/30/19 0238 01/31/19 0411  WBC 3.4* 3.1* 4.7 3.3* 3.5*   LATICACIDVEN 1.3  --   --   --   --     Liver Function Tests: Recent Labs  Lab 01/28/19 0340  AST 23  ALT <5  ALKPHOS 120  BILITOT 0.6  PROT 6.5  ALBUMIN <1.0*   No results for input(s): LIPASE, AMYLASE in the last 168 hours. No results for input(s): AMMONIA in the last 168 hours.  ABG    Component Value Date/Time   PHART 7.270 (L) 01/20/2019 1559   PCO2ART 38.0 01/20/2019 1559   PO2ART 90.0 01/20/2019 1559   HCO3 17.4 (L) 01/20/2019 1559   TCO2 19 (L) 01/20/2019 1559   ACIDBASEDEF 9.0 (H) 01/20/2019 1559   O2SAT 96.0 01/20/2019 1559     Coagulation Profile: No results for input(s): INR, PROTIME in the last 168 hours.  Cardiac Enzymes: No results for input(s): CKTOTAL, CKMB, CKMBINDEX, TROPONINI in the last 168 hours.  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   Surgical History    Past Surgical History:  Procedure Laterality Date  . IR GENERIC HISTORICAL  07/19/2016   IR LUMBAR DISC ASPIRATION W/IMG GUIDE 07/19/2016 Arne Cleveland, MD [facility]INTER[facility] RAD  . RADIOLOGY WITH ANESTHESIA N/A 04/11/2018   Procedure: MRI OF LUMBAR AND THORACIC SPINE WITHOUT CONTRAST WITH ANESTHESIA;  Surgeon: Radiologist, Medication, MD;  Location: [facility];  Service: Radiology;  Laterality: N/A;  . TEE WITHOUT CARDIOVERSION N/A 07/04/2013   Procedure: TRANSESOPHAGEAL ECHOCARDIOGRAM (TEE);   Surgeon: Larey Dresser, MD;  Location: [facility] ENDOSCOPY;  Service: Cardiovascular;  Laterality: N/A;  . TONSILLECTOMY     33 years old     Social History   reports that she has been smoking cigarettes. She has a 3.50 pack-year smoking history. She has never used smokeless tobacco. She reports current drug use. Drugs: IV and Oxycodone. She reports that she does not drink alcohol.   Family History   Her family history includes Alcoholism in her father.   Allergies Allergies  Allergen Reactions  . Vancomycin Other (See Comments)    Possible contributor to AKI and thrombocytopenia     Home Medications  Prior to Admission medications   Medication Sig Start Date End Date Taking? Authorizing Provider  ibuprofen (ADVIL,MOTRIN) 200 MG tablet Take 200-400 mg by mouth every 6 (six) hours as needed for moderate pain.    Yes [provider]  senna-docusate (SENOKOT-S) 8.6-50 MG tablet Take 1 tablet by mouth at bedtime as needed for mild constipation. Patient not taking: Reported on 07/12/2017 07/21/16   Florinda Marker, MD     Critical care time: 60 minutes   The patient is critically ill with multi-organ failure.  Critical care was necessary to treat or prevent imminent or life-threatening deterioration of sepsis, respiratory failure, cardiac failure, renal failure and was exclusive of separately billable procedures and treating other patients. Total critical care time spent by me: 60 Time spent personally by me on obtaining history from patient or surrogate, evaluation of the patient, evaluation of patient's response to treatment, ordering and review of laboratory studies, ordering and review of radiographic studies, ordering and performing treatments and interventions, and re-evaluation of the patient's condition.

## 2019-01-31 NOTE — Progress Notes (Signed)
Assisted televisit to patient with mom Opal Sidles)

## 2019-02-01 ENCOUNTER — Inpatient Hospital Stay (HOSPITAL_COMMUNITY): Payer: Medicaid Other

## 2019-02-01 DIAGNOSIS — I2601 Septic pulmonary embolism with acute cor pulmonale: Secondary | ICD-10-CM

## 2019-02-01 DIAGNOSIS — D649 Anemia, unspecified: Secondary | ICD-10-CM

## 2019-02-01 DIAGNOSIS — Z7189 Other specified counseling: Secondary | ICD-10-CM

## 2019-02-01 LAB — COMPREHENSIVE METABOLIC PANEL
ALT: <5 U/L (ref 0–44)
AST: 19 U/L (ref 15–41)
Albumin: 1.1 g/dL — ABNORMAL LOW (ref 3.5–5.0)
Alkaline Phosphatase: 117 U/L (ref 38–126)
Anion gap: 14 (ref 5–15)
BUN: 146 mg/dL — ABNORMAL HIGH (ref 6–20)
CO2: 29 mmol/L (ref 22–32)
Calcium: 7.3 mg/dL — ABNORMAL LOW (ref 8.9–10.3)
Chloride: 104 mmol/L (ref 98–111)
Creatinine, Ser: 3.09 mg/dL — ABNORMAL HIGH (ref 0.44–1.00)
GFR calc Af Amer: 22 mL/min — ABNORMAL LOW (ref >60)
GFR calc non Af Amer: 19 mL/min — ABNORMAL LOW (ref >60)
Glucose, Bld: 106 mg/dL — ABNORMAL HIGH (ref 70–99)
Potassium: 3.5 mmol/L (ref 3.5–5.1)
Sodium: 147 mmol/L — ABNORMAL HIGH (ref 135–145)
Total Bilirubin: 0.5 mg/dL (ref 0.3–1.2)
Total Protein: 7.7 g/dL (ref 6.5–8.1)

## 2019-02-01 LAB — CBC
HCT: 26.2 % — ABNORMAL LOW (ref 36.0–46.0)
Hemoglobin: 8.3 g/dL — ABNORMAL LOW (ref 12.0–15.0)
MCH: 28 pg (ref 26.0–34.0)
MCHC: 31.7 g/dL (ref 30.0–36.0)
MCV: 88.5 fL (ref 80.0–100.0)
Platelets: 100 10*3/uL — ABNORMAL LOW (ref 150–400)
RBC: 2.96 MIL/uL — ABNORMAL LOW (ref 3.87–5.11)
RDW: 15.7 % — ABNORMAL HIGH (ref 11.5–15.5)
WBC: 3.9 10*3/uL — ABNORMAL LOW (ref 4.0–10.5)
nRBC: 0 % (ref 0.0–0.2)

## 2019-02-01 LAB — GLUCOSE, CAPILLARY
Glucose-Capillary: 96 mg/dL (ref 70–99)
Glucose-Capillary: 98 mg/dL (ref 70–99)
Glucose-Capillary: 104 mg/dL — ABNORMAL HIGH (ref 70–99)
Glucose-Capillary: 126 mg/dL — ABNORMAL HIGH (ref 70–99)
Glucose-Capillary: 141 mg/dL — ABNORMAL HIGH (ref 70–99)

## 2019-02-01 MED ORDER — PRO-STAT SUGAR FREE PO LIQD
30.0000 mL | Freq: Every day | ORAL | Status: DC
  Administered 2019-02-01 – 2019-02-06 (×6): 30 mL via ORAL
  Filled 2019-02-01 (×6): qty 30

## 2019-02-01 MED ORDER — OSMOLITE 1.5 CAL PO LIQD
1000.0000 mL | ORAL | Status: DC
  Administered 2019-02-01 – 2019-02-06 (×4): 1000 mL
  Filled 2019-02-01 (×6): qty 1000

## 2019-02-01 MED ORDER — FUROSEMIDE 10 MG/ML IJ SOLN
40.0000 mg | Freq: Two times a day (BID) | INTRAMUSCULAR | Status: DC
  Administered 2019-02-02: 40 mg via INTRAVENOUS
  Filled 2019-02-01 (×2): qty 4

## 2019-02-01 MED ORDER — FUROSEMIDE 10 MG/ML IJ SOLN: 40.0000 mg | Freq: Two times a day (BID) | INTRAMUSCULAR | Status: DC

## 2019-02-01 NOTE — Progress Notes (Addendum)
Set up tele visit w/ pts mom via E-Link.  Also, provided pts mom w/ pt updates.

## 2019-02-01 NOTE — Progress Notes (Signed)
NAME:  Molly [name], MRN:  [id number], DOB:  1986-08-08, LOS: 21 ADMISSION DATE:  01/20/2019, CONSULTATION DATE: 01/20/2019 REFERRING MD:  Bonner Puna, CHIEF COMPLAINT:  Sepsis  Brief History   33 y/o F with a history of IV drug abuse/polysubstance abuse (presented with urine drug screen positive for opiates and amphetamines) and a previous history in June or July 2019 of inadequately treated MRSA endocarditis having signed out AMA at that time.  She had a tricuspid vegetation at that time.  Admitted 4/4 hypotensive and in respiratory distress requiring intubation at [facility] long.   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 Awake / FC. Looks best in many days. Failed SBT 4/12 Failed SBT.  AKI improving, tx PRBC.  Less alert on precedex, methadone  4/13 More alert, follows commands. On precedex. Failed SBT. Diuresing +9L since admit . ID recommending PICC for antibiotcs > mother not sure about goals of care. S/p 1 unit PRBC    Consults:  Nephrology  Procedures:  ETT 4/4 >>  R Femoral line 4/4 >> 4/8  Significant Diagnostic Tests:  Echocardiogram 4/4 >> TV vegetation measures 1.5 mm x 0.6 mm., severe TR CT scan of the chest4/4 Bilateral pulmonary nodules, many of which are peripheral and cavitated.   Micro Data:  Blood cultures x2 4/4 >> MSSA  BCx2 4/8 >> negative    Antimicrobials:  Vancomycin Zosyn  Teflaro >> stopped Ancef 4/4 >>  Interim history/subjective:  No acute events overnight.  Tmax 99.3.  Transfused for Hbg <7.    Objective   Blood pressure 119/88, pulse 72, temperature 97.9 F (36.6 C), resp. rate 16, height 5\' 1"  (1.549 m), weight 54.8 kg, SpO2 100 %.    Vent Mode: PRVC FiO2 (%):  [30 %] 30 % Set Rate:  [15 bmp] 15 bmp Vt Set:  [450 mL] 450 mL PEEP:  [5 cmH20] 5 cmH20 Plateau Pressure:  [16 cmH20-18 cmH20] 17 cmH20   Intake/Output Summary (Last 24 hours) at 02/01/2019 1255 Last data filed at 02/01/2019 1200 Gross per 24 hour  Intake 588.64 ml  Output 2175 ml  Net -1586.36 ml   Filed Weights   01/30/19 0417 01/31/19 0500 02/01/19 0500  Weight: 56 kg 56 kg 54.8 kg    General: thin, nad HEENT: MM pink/moist, ETT, dry facial skin Neuro: alert awake CV: s1s2 rrr, no m/r/g PULM: even/non-labored, lungs bilaterally clear BT:DHRC, non-tender NBS Extremities: warm/dry,   LABS    PULMONARY No results for input(s): PHART, PCO2ART, PO2ART, HCO3, TCO2, O2SAT in the last 168 hours.  Invalid input(s): PCO2, PO2  CBC Recent Labs  Lab 01/30/19 0238 01/30/19 1107 01/31/19 0411 02/01/19 0508  HGB 6.8* 8.0* 7.7* 8.3*  HCT 22.0* 26.0* 24.3* 26.2*  WBC 3.3*  --  3.5* 3.9*  PLT 71*  --  78* 100*    COAGULATION No results for input(s): INR in the last 168 hours.  CARDIAC  No results for input(s): TROPONINI in the  last 168 hours. No results for input(s): PROBNP in the last 168 hours.   CHEMISTRY Recent Labs  Lab 01/27/19 0400 01/28/19 0340 01/29/19 0234 01/30/19 0238 01/31/19 0411 01/31/19 1300 02/01/19 0508  NA 142 144 144 143  --  145 147*  K 4.2 4.5 4.7 4.3  --  3.0* 3.5  CL 101 105 105 105  --  103 104  CO2 24 28 28 26   --  30 29  GLUCOSE 137* 137* 138* 137*  --  131* 106*  BUN 160* 159* 162* 157*  --  155* 146*  CREATININE 4.73* 4.18* 4.03* 3.60*  --  3.51* 3.09*  CALCIUM 6.9* 6.9* 6.6* 6.6*  --  6.8* 7.3*  MG 2.1 2.2 2.1 2.1 2.2  --    --   PHOS 8.8* 8.8* 9.1* 8.8* 7.7*  --   --    Estimated Creatinine Clearance: 19.9 mL/min (A) (by C-G formula based on SCr of 3.09 mg/dL (H)).   LIVER Recent Labs  Lab 01/28/19 0340 02/01/19 0508  AST 23 19  ALT <5 <5  ALKPHOS 120 117  BILITOT 0.6 0.5  PROT 6.5 7.7  ALBUMIN <1.0* 1.1*     INFECTIOUS Recent Labs  Lab 01/28/19 0340  LATICACIDVEN 1.3     ENDOCRINE CBG (last 3)  Recent Labs    02/01/19 0357 02/01/19 0739 02/01/19 1133  GLUCAP 96 98 104*    IMAGING   Dg Chest Port 1 View  Result Date: 02/01/2019 CLINICAL DATA:  Respiratory difficulty EXAM: PORTABLE CHEST 1 VIEW COMPARISON:  01/30/2019 FINDINGS: Endotracheal and NG tubes are stable. Normal heart size. Low lung volumes. Bilateral patchy ill-defined opacities are not significantly changed. Diaphragms are better visualized likely due to layering pleural fluid bilaterally. Improvement in the pleural effusions may be present. Bilateral cavitary lung lesions are stable. IMPRESSION: Stable endotracheal and NG tubes. Bilateral patchy airspace opacities are not significantly changed. Bilateral cavitary lung lesions are stable. Improvement in bilateral pleural effusions is suspected. Electronically Signed   By: Marybelle Killings M.D.   On: 02/01/2019 08:22    Resolved Hospital Problem list     Acute Hypoxemic Respiratory Failure secondary to MSSA Bacteremia, Septic Shock in the setting of IVDA P: PRVC 8cc/kg  Wean PEEP / FiO2 for sats >90% Follow intermittent CXR On minimal vent settings but becomes profoundly apneic despite being awake on SBT.  Consulted for trach.  Plan for trach tomorrow with Dr. Nelda Marseille.   Septic Shock in setting of MSSA Bacteremia  Infective Endocarditis  -abx started 4/8 Now hemodynamically stable.  Severe Tricuspid Regurgitation  P: ICU monitoring  Continue Cefazolin Plan to continue abx for 6 weeks Consider PICC, ID requesting for abx.  Will need to discuss plan / [place] with mother.  LCB - no CPR   AKI -"not an HD candidate"  P: Free water PT  Trend BMP / urinary output Replace electrolytes as indicated Avoid nephrotoxic agents, ensure adequate renal perfusion  At Risk Malnutrition  P: Continue TF per Nutrition  Anemia  -of critical illness Thrombocytopenia  P: Trend CBC  Transfuse per ICU guidelines   Intermittent Agitation  -CT head negative -MRI brain / spine deferred  P:   Continue methadone, now on 10mg   qd RASS Goal: 0 to -1 Precedex     Best practice:  Diet: TF Pain/Anxiety/Delirium protocol (if indicated): precedex VAP protocol (if indicated): in place DVT prophylaxis: scd GI prophylaxis: PPI Glucose control: protocal Mobility: as tolerated Code Status: Limited code  Family Communication: - prior discussion 4/6- no cvvh, no cpr, no mri - continue full medical care otherwise. Updated 4/8. On 4/11 - LMTCB. On 02/16/2019 - mom's cell appears dead. RN also reports difficulty getting hold of mom BOTH ACCEPTING trach and peg 4/15 meeting (per palliative notes) Disposition: ICU   CC Time: 35 minutes

## 2019-02-01 NOTE — Progress Notes (Signed)
Nutrition Follow-up  DOCUMENTATION CODES:   Not applicable  INTERVENTION:   Tube Feeding:  Change to Osmolite 1.5 at 40 ml/hr Pro-Stat 30 mL daily Provides 75 g of protein, 1540 kcals, 730 mL of free water Meets 100% protein, calorie needs Provides 960 mg of phosphorus daily   NUTRITION DIAGNOSIS:   Inadequate oral intake related to inability to eat(pt sedated and ventilated ) as evidenced by NPO status.  Being addressed via TF   GOAL:   Provide needs based on ASPEN/SCCM guidelines  Progressing  MONITOR:   Vent status, Labs, Weight trends, I & O's, Skin  REASON FOR ASSESSMENT:   Ventilator    ASSESSMENT:    33 y/o female with h/o IVDU/polysubstance abuse and MRSA bacteremia/TV endocarditis/spinal osteomyelitis/septic pulmonary emboli (left AMA July 2019) who presented to the ED with AMS found to be septic with hypothermia, hypotension, and hypoxia.    Palliative care following, noted family want to pursue trach Patient is currently intubated on ventilator support MV: 7.2 L/min Temp (24hrs), Avg:98.5 F (36.9 C), Min:97.7 F (36.5 C), Max:99.1 F (37.3 C)  Pt complaining of abdominal pain, +BM. Episode of emesis last night, +nausea. Nepro TF currently on hold. Noted reglan one time dose yesterday Receiving free water flush 200 mL q 8 hours  Phosphorus remains elevated despiste switching to Nepro formula which is lower in phosphorus  Admision weight 45.8 kg; current wt 54.8 kg. Net + 14 L per I/O flow sheet  Labs: sodium 147 (H), BUN 146, Creatinine 3.09, CBGs 95-106, corrected calcium 9.6, albumin 1.1  Meds: lasix, methadone   Diet Order:   Diet Order            Diet NPO time specified  Diet effective now              EDUCATION NEEDS:   Not appropriate for education at this time  Skin:  Skin Assessment: Reviewed RN Assessment(skin tears, no pressure injuries noted)  Last BM:  4/16  Height:   Ht Readings from Last 1 Encounters:  01/20/19  5\' 1"  (1.549 m)    Weight:   Wt Readings from Last 1 Encounters:  02/01/19 54.8 kg    Ideal Body Weight:  47.7 kg  BMI:  Body mass index is 22.83 kg/m.  Estimated Nutritional Needs:   Kcal:  1560 kcals   Protein:  70-90 g   Fluid:  >/= 1.5 L   Kerman Passey MS, RD, LDN, CNSC [phone] Pager  [phone] Weekend/On-Call Pager

## 2019-02-01 NOTE — Progress Notes (Signed)
[facility] Progress Note Patient Name: Molly [name] DOB: 1986/09/24 MRN: [id number]   Date of Service  02/01/2019  HPI/Events of Note  K+ = 3.5 and Creatinine = 3.09.   eICU Interventions  Will not replace K+ d/t severe renal dysfunction.      Intervention Category Major Interventions: Electrolyte abnormality - evaluation and management  Virginio Isidore Eugene 02/01/2019, 6:46 AM

## 2019-02-01 NOTE — Progress Notes (Signed)
[facility] Daily Progress Note    NAME:  Molly [name], MRN:  [id number], DOB:  26-Dec-1985, LOS: 26 ADMISSION DATE:  01/20/2019 CHIEF COMPLAINT:  Confusion, malaise  Brief History/HPI   Ms Culbreth is a 33 y.o. 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        In the ER, hypotensive, confused, required intubation.  Blood cultures growing MSSA in 2/2.  CT chest with bilateral nodular cavitary lesions.  AKI Cr > 7, and thrombocytopenia Plts 17K, Hgb 7, in DIC.  Started on Teflaro.     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, TV endocarditis and septic emboli     Second episode June 2019, left AMA without completing therapy Substance use disorder CKD III   2014               0.7- 0.8   2017               1.51 >> 0.8      AKI #1, 46 - > 60   June 2019      3.81 >> 1.54    AKI #2, 15- 44   July 2019       2.53                 24    Significant Hospital Events   Intubated 4/4  Consults:  Pulmonology ID Nephrology  Procedures:  ETT 4/4 Central line 4/4  Significant Diagnostic Tests:  CT head 4/4 -- unremarkable CT chest w/o 4/4 - bilatearl cavitary pulmonary nodules, consistent with setic emboli, possble right base PNA, ?dilated pulmonary artery Echo 4/4 -- EF normal, tricuspid valve vegetation 1.5x0.6cm, severe TR Renal US 4/4 -- chronic renal medical disease, no hydro, no masses  Micro Data:  4/4 Blood culture x2 -- MSSA 2/2 4/4 Urine cx -- MSSA 4/8 Blood cultures x2 -- NGTD  Antimicrobials:  Zosyn 4/4 x1 Ceftaroline 4/4 x1 Vancomycin 4/4 x1 Cefazolin 4/4 >>  Interim history/subjective:  Awake and following commands, conversing on paper.  But completely  apneic with SBT.    No fever, rashes, focal weakness, diarrhea, vomiting.  Complains she is thirsty.         Objective   Blood pressure 119/88, pulse 72, temperature 97.9 F (36.6 C), resp. rate 16, height 5\' 1"  (1.549 m), weight 54.8 kg, SpO2 100 %.    Vent Mode: PRVC FiO2 (%):  [30 %] 30 % Set Rate:  [15 bmp] 15 bmp Vt Set:  [450 mL] 450 mL PEEP:  [5 cmH20] 5 cmH20 Plateau Pressure:  [16 cmH20-18 cmH20] 17 cmH20   Intake/Output Summary (Last 24 hours) at 02/01/2019 1523 Last data filed at 02/01/2019 1500 Gross per 24 hour  Intake 539.31 ml  Output 1887.5 ml  Net -1348.19 ml   Filed Weights   01/30/19 0417 01/31/19 0500 02/01/19 0500  Weight: 56 kg 56 kg 54.8 kg    Examination: General appearance: Cachectic adult female, lying in bed, interactive.  Intubated. HEENT: Anicteric, conjunctival pink, lids and lashes normal.  No nasal deformity, discharge, or epistaxis.  Lips dry, ET tube in place, oropharynx and tongue are moist. Skin:  Confluent petechiae on the legs.  Diffuse hyperpigmentation of old papular rash, apparent old folliculitis on arms, forehead. Cardiac: Tachycardic, regular, systolic murmur noted, gallop noted, swelling improved in hands, feet Respiratory: Comfortable on ventilator, lungs without rales or wheezes. MSK: Very diffuse loss of subcutaneous muscle mass intact. Abdomen: Abdomen soft, no tenderness to palpation or ascites or distention. Neuro/Psych: Alert, moves both upper extremities with normal coordination, and running 4, grammar poor, but able to make needs known, interact meaningfully.  Extraocular movements intact.     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  Discussed with CT surgery.  The patient is not a surgical candidate while intubated.  Her medical noncompliance is also relative  contraindication to treatment. -ID involved, utilizing systematic approach to staph endocarditis  Acute on chronic renal failure Baseline likely in the 3s prior to admission, admitted with Cr >7, resolved to 3.6 with diuresis.   Cr improved again today, BUN still >100.  Net negative 1L with TID Lasix yesterday. -Continue Lasix BID -Daily BMP  -Strict I/Os  Acute diastolic CHF EF 72%, but tricuspid regurgitation and net positive 15L on admission. -Continue Lasix -Daily BMP -Close monitoring electrolytes -Strict I/Os  Substance abuse disorder -Continue methadone, dose reduced to 10 on 4/15 due to apnea with SBT  Severe protein calorie malnutrition -Continue TFs -Continue Free water  Anemia of chronic disease and CKD Pancytopenia Hgb trending down, no clinical bleeding.  Platelets nadir less than 10, now still trending up -Trend hemogram      Best practice:  Diet: TFs Pain/Anxiety/Delirium protocol (if indicated): In place VAP protocol (if indicated): In place DVT prophylaxis: SCDs, maybe lovenox tomorrow if plts improve GI prophylaxis: PPI Glucose control: Adquate Mobility: BR, PT eval Code Status: FULL Family Communication: mOther updated by phone  Disposition: The patient was admitted with MSSA bacteremia, tricuspid valve endocarditis, lesion greater than 1 cm.  She also has acute on chronic renal failure, which appears to be resolving to her chronic stage IV kidney disease.  We will continue diuresis, monitor BUN.  Progress towards tracheostomy now and LTAC.  I am uncertain if medical therapy alone can eradicate her endocarditis.  I am also unclear if she were able to rehabilitate and decannulate the trachea (and adhere to her treatment plan faithfully), would she be able to be reconsidered for valve surgery?       Labs   CBC: Recent Labs  Lab 01/29/19 0234 01/29/19 0526 01/30/19 0238 01/30/19 1107 01/31/19 0411 02/01/19 0508  WBC 3.1* 4.7 3.3*  --   3.5* 3.9*  NEUTROABS  --   --  2.7  --  2.9  --   HGB 10.3* 7.5* 6.8* 8.0* 7.7* 8.3*  HCT 31.5* 23.9* 22.0* 26.0* 24.3* 26.2*  MCV 86.3 86.9 88.4  --  88.0 88.5  PLT 37* 58* 71*  --  78* 100*    Basic Metabolic Panel: Recent Labs  Lab 01/27/19 0400 01/28/19 0340 01/29/19 0234 01/30/19 0238 01/31/19 0411 01/31/19 1300 02/01/19 0508  NA 142 144 144 143  --  145 147*  K 4.2 4.5 4.7 4.3  --  3.0* 3.5  CL 101 105 105 105  --  103 104  CO2 24 28 28 26   --  30 29  GLUCOSE 137* 137* 138* 137*  --  131* 106*  BUN 160* 159* 162* 157*  --  155* 146*  CREATININE 4.73* 4.18* 4.03* 3.60*  --  3.51* 3.09*  CALCIUM 6.9* 6.9* 6.6* 6.6*  --  6.8* 7.3*  MG 2.1 2.2 2.1 2.1 2.2  --   --   PHOS 8.8* 8.8* 9.1* 8.8* 7.7*  --   --    GFR: Estimated Creatinine Clearance: 19.9 mL/min (A) (by C-G formula based on SCr of 3.09 mg/dL (H)). Recent Labs  Lab 01/28/19 0340  01/29/19 0526 01/30/19 0238 01/31/19 0411 02/01/19 0508  WBC 3.4*   < > 4.7 3.3* 3.5* 3.9*  LATICACIDVEN 1.3  --   --   --   --   --    < > = values in this interval not displayed.    Liver Function Tests: Recent Labs  Lab 01/28/19 0340 02/01/19 0508  AST 23 19  ALT <5 <5  ALKPHOS 120 117  BILITOT 0.6 0.5  PROT 6.5 7.7  ALBUMIN <1.0* 1.1*   No results for input(s): LIPASE, AMYLASE in the last 168 hours. No results for input(s): AMMONIA in the last 168 hours.  ABG    Component Value Date/Time   PHART 7.270 (L) 01/20/2019 1559   PCO2ART 38.0 01/20/2019 1559   PO2ART 90.0 01/20/2019 1559   HCO3 17.4 (L) 01/20/2019 1559   TCO2 19 (L) 01/20/2019 1559   ACIDBASEDEF 9.0 (H) 01/20/2019 1559   O2SAT 96.0 01/20/2019 1559     Coagulation Profile: No results for input(s): INR, PROTIME in the last 168 hours.  Cardiac Enzymes: No results for input(s): CKTOTAL, CKMB, CKMBINDEX, TROPONINI in the last 168 hours.  HbA1C: No results found for: HGBA1C  CBG: Recent Labs  Lab 01/31/19 2009 01/31/19 2331 02/01/19 0357  02/01/19 0739 02/01/19 1133  GLUCAP 106* 95 96 98 104*    Review of Systems:   Unable to assess given intubated  Past Medical History  She,  has a past medical history of Abscess in epidural space of T12/L1 spine, Abscess of skin, CKD (chronic kidney disease) stage 3, GFR 30-59 ml/min ([place]), Drug-seeking behavior, Hypersplenism, IV drug user, MRSA (methicillin resistant Staphylococcus aureus) infection, MRSA bacteremia, Pancytopenia ([place]), 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), Septic pulmonary embolism ([place]), and Tobacco abuse.   Surgical History    Past Surgical History:  Procedure Laterality Date  . IR GENERIC HISTORICAL  07/19/2016   IR LUMBAR DISC ASPIRATION W/IMG GUIDE 07/19/2016 Arne Cleveland, MD [facility]INTER[facility] RAD  . RADIOLOGY WITH ANESTHESIA N/A 04/11/2018   Procedure: MRI OF LUMBAR AND THORACIC SPINE WITHOUT CONTRAST WITH ANESTHESIA;  Surgeon: Radiologist, Medication, MD;  Location: [facility];  Service: Radiology;  Laterality: N/A;  . TEE WITHOUT CARDIOVERSION N/A 07/04/2013   Procedure: TRANSESOPHAGEAL ECHOCARDIOGRAM (TEE);  Surgeon: Larey Dresser, MD;  [facility];  Service: Cardiovascular;  Laterality: N/A;  . TONSILLECTOMY     33 years old     Social History   reports that she has been smoking cigarettes. She has a 3.50 pack-year smoking history. She has never used smokeless tobacco. She reports current drug use. Drugs: IV and Oxycodone. She reports that she does not drink alcohol.   Family History   Her family history includes Alcoholism in her father.   Allergies Allergies  Allergen Reactions  . Vancomycin Other (See Comments)    Possible contributor to AKI and thrombocytopenia     Home Medications  Prior to Admission medications   Medication Sig Start Date End Date Taking? Authorizing Provider  ibuprofen (ADVIL,MOTRIN) 200 MG tablet Take 200-400 mg by mouth  every 6 (six) hours as needed for moderate pain.    Yes [provider]  senna-docusate  (SENOKOT-S) 8.6-50 MG tablet Take 1 tablet by mouth at bedtime as needed for mild constipation. Patient not taking: Reported on 07/12/2017 07/21/16   Florinda Marker, MD     Critical care time: 40 minutes   The patient is critically ill with multi-organ failure.  Critical care was necessary to treat or prevent imminent or life-threatening deterioration of sepsis, respiratory failure, cardiac failure, renal failure and was exclusive of separately billable procedures and treating other patients. Total critical care time spent by me: 40 Time spent personally by me on obtaining history from patient or surrogate, evaluation of the patient, evaluation of patient's response to treatment, ordering and review of laboratory studies, ordering and review of radiographic studies, ordering and performing treatments and interventions, and re-evaluation of the patient's condition.

## 2019-02-01 NOTE — Progress Notes (Signed)
Assisted tele visit to patient with mother.  Molly [name], Aliene Beams, RN

## 2019-02-01 NOTE — Progress Notes (Signed)
Plan for pt to receive trach and peg. Trach scheduled for tomorrow. Have attempted to reach pts mom 2x for consent, left vm. Awaiting call back.

## 2019-02-01 NOTE — Progress Notes (Signed)
Daily Progress Note   Patient Name: Molly [name]       Date: 02/01/2019 DOB: 1986/03/05  Age: 33 y.o. MRN#: [id number] Attending Physician: Edwin Dada, * Primary Care Physician: Patient, No Pcp Per Admit Date: 01/20/2019  Reason for Consultation/Follow-up: Establishing goals of care  Subjective: Evaluated patient. She was more alert today and able to write on white board for communication. She requested water to drink, could not understand reasoning for not being allowed water despite simple explanations of intubation. Attempted to engage in [place] discussion. Patient would not engage. When asked if she wanted me to tell her mother anything, she wrote, "come get me". Again tried to explain patient's medical situation, address emotional, spiritual needs, discuss GOC, but patient would not engage- she would only look away.   Spoke to patient's mother again via phone. Molly [name] spoke with [place] via video call today. States she explained trach. Discussed with Molly [name] [name]'s overall difficult long term trajectory. Discussed possible anticipatory healthcare decisions that may occur. Molly [name] continues to state if [name] needed dialysis she would decline that. She also desires to continue DNR status aside from current ventilator support. She agrees with PEG, but is hopeful that patient will regain ability to take in PO after trach heals.   Molly [name] is hopeful that patient can receive trach and continue attempts to wean off ventilator. She is aware of patient's poor health status and multiple comorbidities that are likely to present patient with recurrent life threatening illness. Molly [name] is aware that patient's lifetime is likely limited despite current interventions. Her GOC at this point is  to extend patient's time and provide opportunity for some improvement.    Length of Stay: 12  Current Medications:    Physical Exam Vitals signs and nursing note reviewed.  Constitutional:      Appearance: Normal appearance.  Cardiovascular:     Rate and Rhythm: Normal rate.  Pulmonary:     Comments: intubated Skin:    Coloration: Skin is pale.     Comments: Scattered bruises, scabs  Neurological:     Mental Status: She is alert.             Vital Signs: BP 119/88 (BP Location: Left Arm)   Pulse 72   Temp 97.9 F (36.6 C)   Resp 16   Ht  5\' 1"  (1.549 m)   Wt 54.8 kg   SpO2 100%   BMI 22.83 kg/m  SpO2: SpO2: 100 % O2 Device: O2 Device: Ventilator O2 Flow Rate:    Intake/output summary:   Intake/Output Summary (Last 24 hours) at 02/01/2019 1302 Last data filed at 02/01/2019 1200 Gross per 24 hour  Intake 538.64 ml  Output 2087.5 ml  Net -1548.86 ml   LBM: Last BM Date: 01/31/19 Baseline Weight: Weight: 54.4 kg Most recent weight: Weight: 54.8 kg       Palliative Assessment/Data: PPS: 10%      Patient Active Problem List   Diagnosis Date Noted  . Goals of care, counseling/discussion   . Advanced care planning/counseling discussion   . Acute cystitis with hematuria   . DIC (disseminated intravascular coagulation) ([place])   . Multifocal pneumonia   . Acute encephalopathy 01/20/2019  . Thrombocytopenia ([place]) 01/20/2019  . Sepsis with disseminated intravascular coagulopathy (DIC) ([place]) 01/20/2019  . Sepsis ([place]) 01/20/2019  . Pancytopenia, acquired ([place]) 04/22/2018  . 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 CKD (chronic kidney disease) stage 3, GFR 30-59 ml/min ([facility]) 04/22/2018  . IV drug user 04/22/2018  . MSSA bacteremia 04/22/2018  . Acute septic pulmonary embolism with acute cor pulmonale (June 2019) 04/22/2018  .  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 Tobacco abuse 04/22/2018  . Noncompliance 04/22/2018  . Abnormal  urinalysis 04/22/2018  . 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 Addiction to drug ([place])   . Acute kidney failure ([place])   . Epidural abscess   . Acute bacterial endocarditis   . Chronic thoracic back pain   . Palliative care by specialist   . Sepsis with multiple organ dysfunction (MOD) ([place]) 04/08/2018  . Hyponatremia 04/08/2018  . Eczema 07/12/2017  . Acute upper respiratory infection 11/02/2016  . Hepatitis C antibody test positive 08/24/2016  . Discitis of lumbosacral region 07/19/2016  . Splenomegaly   . Osteomyelitis of vertebra of thoracolumbar region ([place])   . Polysubstance abuse ([place])   . Encounter for orogastric (OG) tube placement   . Cocaine use 11/28/2015  . Pancytopenia ([place]) 11/27/2015  . AKI (acute kidney injury) ([place]) 11/27/2015  . Anasarca 11/27/2015  . Cellulitis of multiple sites of right hand and fingers 07/07/2013  . Acute respiratory failure ([place]) 07/04/2013  . Chronic anemia 07/04/2013  . Pulmonary HTN ([place]) 07/04/2013  . 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 Septic pulmonary embolism ([place]) 07/03/2013  . Intravenous drug abuse, continuous ([place]) 07/03/2013  . MRSA bacteremia 07/03/2013    Palliative Care Assessment & Plan   Patient Profile: 33 y.o. female  with past medical history of IV drug use, MRSA endocarditis, tricuspid vegetation admitted on 01/20/2019 with hypotension, respiratory distress requiring intubation. Found to be in septic shock d/t septic emboli with MSSA bacteremia. Prior [place] discussion with family has established- no CVVH, no CPR. Patient has been unable to wean, goes apneic with SBT. Patient's mother has been unsure about proceeding with tracheostomy now being recommended. Palliative medicine consulted for further [place] and discussion of complex decision making.  Assessment/Recommendations/Plan   Proceed with trach/PEG- hopeful for LTACH placement  Limits to care set- no  dialysis, DNR   Code Status:  DNR  Prognosis:   Unable to determine  Discharge Planning:  [name] was discussed with patient's mother- Molly [name]  Thank you for allowing the Palliative Medicine Team to assist in the  care of this patient.   Time In: 1200 Time Out: 1300 Total Time 60 mins Prolonged Time Billed no      Greater than 50%  of this time was spent counseling and coordinating care related to the above assessment and plan.  Mariana Kaufman, AGNP-C Palliative Medicine   Please contact Palliative Medicine Team phone at [phone] for questions and concerns.

## 2019-02-01 NOTE — Progress Notes (Signed)
01/31/2019 at shift change, RN notified that patient had thrown up earlier in the shift and that tube feeds were currently on hold. At first assessment, this RN inquired with the patient who is alert and able to answer questions about tube feeds. She stated that her stomach was still uneasy and requested that the tube feeds remain off. At 0000 and 0400 assessments, patient still stated she wished that the tube feeds would remain off at this time. Will notify dayshift RN and have them collaborate with medical team. Will continue to closely monitor.

## 2019-02-02 ENCOUNTER — Inpatient Hospital Stay (HOSPITAL_COMMUNITY): Payer: Medicaid Other

## 2019-02-02 LAB — COMPREHENSIVE METABOLIC PANEL
ALT: <5 U/L (ref 0–44)
AST: 22 U/L (ref 15–41)
Albumin: 1.1 g/dL — ABNORMAL LOW (ref 3.5–5.0)
Alkaline Phosphatase: 100 U/L (ref 38–126)
Anion gap: 12 (ref 5–15)
BUN: 135 mg/dL — ABNORMAL HIGH (ref 6–20)
CO2: 29 mmol/L (ref 22–32)
Calcium: 7.4 mg/dL — ABNORMAL LOW (ref 8.9–10.3)
Chloride: 108 mmol/L (ref 98–111)
Creatinine, Ser: 2.86 mg/dL — ABNORMAL HIGH (ref 0.44–1.00)
GFR calc Af Amer: 24 mL/min — ABNORMAL LOW (ref >60)
GFR calc non Af Amer: 21 mL/min — ABNORMAL LOW (ref >60)
Glucose, Bld: 150 mg/dL — ABNORMAL HIGH (ref 70–99)
Potassium: 3.8 mmol/L (ref 3.5–5.1)
Sodium: 149 mmol/L — ABNORMAL HIGH (ref 135–145)
Total Bilirubin: 0.3 mg/dL (ref 0.3–1.2)
Total Protein: 7.3 g/dL (ref 6.5–8.1)

## 2019-02-02 LAB — CBC
HCT: 27 % — ABNORMAL LOW (ref 36.0–46.0)
Hemoglobin: 7.9 g/dL — ABNORMAL LOW (ref 12.0–15.0)
MCH: 27 pg (ref 26.0–34.0)
MCHC: 29.3 g/dL — ABNORMAL LOW (ref 30.0–36.0)
MCV: 92.2 fL (ref 80.0–100.0)
Platelets: 95 10*3/uL — ABNORMAL LOW (ref 150–400)
RBC: 2.93 MIL/uL — ABNORMAL LOW (ref 3.87–5.11)
RDW: 15.5 % (ref 11.5–15.5)
WBC: 2.9 10*3/uL — ABNORMAL LOW (ref 4.0–10.5)
nRBC: 0 % (ref 0.0–0.2)

## 2019-02-02 LAB — GLUCOSE, CAPILLARY
Glucose-Capillary: 109 mg/dL — ABNORMAL HIGH (ref 70–99)
Glucose-Capillary: 114 mg/dL — ABNORMAL HIGH (ref 70–99)
Glucose-Capillary: 115 mg/dL — ABNORMAL HIGH (ref 70–99)
Glucose-Capillary: 127 mg/dL — ABNORMAL HIGH (ref 70–99)
Glucose-Capillary: 131 mg/dL — ABNORMAL HIGH (ref 70–99)
Glucose-Capillary: 90 mg/dL (ref 70–99)

## 2019-02-02 LAB — PROTIME-INR
INR: 1.7 — ABNORMAL HIGH (ref 0.8–1.2)
Prothrombin Time: 19.6 seconds — ABNORMAL HIGH (ref 11.4–15.2)

## 2019-02-02 MED ORDER — VITAMIN C 500 MG PO TABS
250.0000 mg | ORAL_TABLET | Freq: Two times a day (BID) | ORAL | Status: DC
  Administered 2019-02-02 – 2019-02-12 (×20): 250 mg via NASOGASTRIC
  Administered 2019-02-12: 20:00:00 via NASOGASTRIC
  Administered 2019-02-13 – 2019-02-26 (×28): 250 mg via NASOGASTRIC
  Filled 2019-02-02 (×49): qty 1

## 2019-02-02 MED ORDER — HEPARIN SODIUM (PORCINE) 5000 UNIT/ML IJ SOLN: 5000.0000 [IU] | Freq: Three times a day (TID) | INTRAMUSCULAR | Status: DC

## 2019-02-02 MED ORDER — FENTANYL CITRATE (PF) 100 MCG/2ML IJ SOLN
25.0000 ug | INTRAMUSCULAR | Status: DC | PRN
  Administered 2019-02-02 – 2019-02-03 (×4): 75 ug via INTRAVENOUS
  Filled 2019-02-02 (×4): qty 2

## 2019-02-02 MED ORDER — VECURONIUM BROMIDE 10 MG IV SOLR
10.0000 mg | Freq: Once | INTRAVENOUS | Status: AC
  Administered 2019-02-02: 10 mg via INTRAVENOUS
  Filled 2019-02-02: qty 10

## 2019-02-02 MED ORDER — SODIUM CHLORIDE 0.9 % IV SOLN
0.3000 ug/kg | Freq: Once | INTRAVENOUS | Status: AC
  Administered 2019-02-02: 16 ug via INTRAVENOUS
  Filled 2019-02-02: qty 4

## 2019-02-02 MED ORDER — FREE WATER: 200.0000 mL | Freq: Four times a day (QID) | Status: DC

## 2019-02-02 MED ORDER — FENTANYL CITRATE (PF) 100 MCG/2ML IJ SOLN
200.0000 ug | Freq: Once | INTRAMUSCULAR | Status: AC
  Administered 2019-02-02: 100 ug via INTRAVENOUS
  Filled 2019-02-02: qty 4

## 2019-02-02 MED ORDER — FREE WATER: 200.0000 mL | Freq: Three times a day (TID) | Status: DC

## 2019-02-02 MED ORDER — MIDAZOLAM HCL 2 MG/2ML IJ SOLN
2.0000 mg | Freq: Once | INTRAMUSCULAR | Status: AC
  Administered 2019-02-02: 14:00:00 2 mg via INTRAVENOUS

## 2019-02-02 MED ORDER — PROPOFOL 500 MG/50ML IV EMUL
500.0000 mg | Freq: Once | INTRAVENOUS | Status: DC
  Filled 2019-02-02: qty 60

## 2019-02-02 MED ORDER — PROPOFOL 1000 MG/100ML IV EMUL
5.0000 ug/kg/min | INTRAVENOUS | Status: DC
  Administered 2019-02-02: 5 ug/kg/min via INTRAVENOUS
  Administered 2019-02-03 (×2): 10 ug/kg/min via INTRAVENOUS
  Filled 2019-02-02 (×2): qty 100

## 2019-02-02 MED ORDER — "THROMBI-PAD 3""X3"" EX PADS"
2.0000 | MEDICATED_PAD | Freq: Once | CUTANEOUS | Status: AC
  Administered 2019-02-02: 2 via TOPICAL
  Filled 2019-02-02: qty 2

## 2019-02-02 MED ORDER — STERILE WATER FOR INJECTION IJ SOLN
INTRAMUSCULAR | Status: AC
  Administered 2019-02-02: 10 mL
  Filled 2019-02-02: qty 10

## 2019-02-02 MED ORDER — FREE WATER
250.0000 mL | Freq: Three times a day (TID) | Status: DC
  Administered 2019-02-02 – 2019-02-03 (×3): 250 mL

## 2019-02-02 MED ORDER — FENTANYL CITRATE (PF) 100 MCG/2ML IJ SOLN
100.0000 ug | Freq: Once | INTRAMUSCULAR | Status: AC
  Administered 2019-02-02: 100 ug via INTRAVENOUS

## 2019-02-02 MED ORDER — MIDAZOLAM HCL 2 MG/2ML IJ SOLN
5.0000 mg | Freq: Once | INTRAMUSCULAR | Status: AC
  Administered 2019-02-02: 2 mg via INTRAVENOUS
  Filled 2019-02-02: qty 6

## 2019-02-02 MED ORDER — FENTANYL CITRATE (PF) 100 MCG/2ML IJ SOLN
100.0000 ug | INTRAMUSCULAR | Status: AC | PRN
  Administered 2019-02-02 (×2): 100 ug via INTRAVENOUS
  Filled 2019-02-02 (×2): qty 2

## 2019-02-02 MED ORDER — ETOMIDATE 2 MG/ML IV SOLN
40.0000 mg | Freq: Once | INTRAVENOUS | Status: AC
  Administered 2019-02-02: 20 mg via INTRAVENOUS
  Filled 2019-02-02: qty 20

## 2019-02-02 MED ORDER — SODIUM CHLORIDE 0.9% IV SOLUTION
Freq: Once | INTRAVENOUS | Status: AC
  Administered 2019-02-02: 20:00:00 via INTRAVENOUS

## 2019-02-02 MED ORDER — PROPOFOL 500 MG/50ML IV EMUL
INTRAVENOUS | Status: AC
  Filled 2019-02-02: qty 50

## 2019-02-02 NOTE — Progress Notes (Signed)
[facility] Daily Progress Note    NAME:  Molly [name], MRN:  [id number], DOB:  1985-12-04, LOS: 8 ADMISSION DATE:  01/20/2019 CHIEF COMPLAINT:  Confusion, malaise  Brief History/HPI   Molly [name] is a 33 y.o. 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        In the ER, hypotensive, confused, required intubation.  Blood cultures growing MSSA in 2/2.  CT chest with bilateral nodular cavitary lesions.  AKI Cr > 7, and thrombocytopenia Plts 17K, Hgb 7, in DIC.  Started on Teflaro.      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, TV endocarditis and septic emboli     Second episode June 2019, left AMA without completing therapy Substance use disorder CKD III   2014               0.7- 0.8   2017               1.51 >> 0.8      AKI #1, 46 - > 60   June 2019      3.81 >> 1.54    AKI #2, 15- 44   July 2019       2.53                 [facility] Events   Intubated 4/4 Tracheostomy 4/17   Consults:  Pulmonology ID Nephrology     Procedures:  ETT 4/4 Central line 4/4 Tracheostomy 4/17     Significant Diagnostic Tests:  CT head 4/4 -- unremarkable CT chest w/o 4/4 - bilatearl cavitary pulmonary nodules, consistent with setic emboli, possble right base PNA, ?dilated pulmonary artery Echo 4/4 -- EF normal, tricuspid valve vegetation 1.5x0.6cm, severe TR Renal US 4/4 -- chronic renal medical disease, no hydro, no masses     Micro Data:  4/4 Blood culture x2 -- MSSA 2/2 4/4 Urine cx -- MSSA 4/8 Blood cultures x2 -- NGTD     Antimicrobials:  Zosyn 4/4 x1 Ceftaroline 4/4 x1 Vancomycin 4/4 x1 Cefazolin 4/4 >>     Interim  history/subjective:   Awake and following commands.  No complaints.  Thirsty.  Apneic with SBT.  No swelling.        Objective   Blood pressure (!) 126/97, pulse 65, temperature 98.8 F (37.1 C), resp. rate 15, height 5\' 1"  (1.549 m), weight 53 kg, SpO2 100 %.    Vent Mode: PRVC FiO2 (%):  [30 %] 30 % Set Rate:  [15 bmp] 15 bmp Vt Set:  [450 mL] 450 mL PEEP:  [5 cmH20] 5 cmH20 Plateau Pressure:  [16 cmH20-18 cmH20] 16 cmH20   Intake/Output Summary (Last 24 hours) at 02/02/2019 1546 Last data filed at 02/02/2019 1200 Gross per 24 hour  Intake 1844.42 ml  Output 1950 ml  Net -105.58 ml   Filed Weights   01/31/19 0500 02/01/19 0500 02/02/19 0500  Weight: 56 kg 54.8 kg 53 kg    Examination: General appearance: Cachectic adult female, lying in bed, interactive, intubated. HEENT: Icteric, conjunctival pink, lids and lashes normal.  No nasal deformity, discharge, or epistaxis.  Lips  dry, tongue moist, ET tube in place.  Oropharynx normal.. Skin: Confluent petechiae in the legs, diffuse hyperpigmentation of an old papular rash, appears to be old folliculitis Cardiac: Tachycardic, regular, systolic murmur noted, gallop noted, swelling resolved. Respiratory: Comfortable on ventilator, lungs without rales or wheezes. MSK: Loss of subcutaneous muscle mass and fat Abdomen: A without tenderness to palpation or distention.   Neuro/Psych: Alert, moves both upper extremities with normal coordination, and running 4, grammar poor, but able to make needs known, interact meaningfully.  Extraocular movements intact.     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  Discussed with CT  surgery.  The patient is not a surgical candidate while intubated.  Her medical noncompliance is also relative contraindication to treatment. -ID involved, utilizing systematic approach to staph endocarditis  Acute on chronic renal failure Baseline likely in the 3s prior to admission, admitted with Cr >7, resolved to 3.6 with diuresis.   Cr improving.  BUN very elevated but trending down.  Was net negative again yesterday on 1 dose of Lasix.  No longer appears fluid overloaded.    -Daily BMP  -Strict I/Os  Hypernatremia -Hold Lasix  Acute diastolic CHF EF 47%, but tricuspid regurgitation and net positive 15L on admission. -Hold further Lasix -Daily BMP -Close monitoring electrolytes -Strict I/Os  Substance abuse disorder Patient has a history of severe substance use disorder, has left hospital AMA repeatedly to use.  Would be ideal to wean opiates off while intubated and relatively confined.   -Continue methadone, dose reduced to 10 on 4/15, plan for continued wean  Severe protein calorie malnutrition -Continue TFs -Continue Free water  Anemia of chronic disease and CKD Pancytopenia thromboCytopenia Hgb trending down, no clinical bleeding.  Platelets nadir less than 10, now still trending up -Trend hemogram      Best practice:  Diet: TFs Pain/Anxiety/Delirium protocol (if indicated): In place VAP protocol (if indicated): In place DVT prophylaxis: SCDs GI prophylaxis: PPI Glucose control: Adequate Mobility: BR, PT eval Code Status: FULL Family Communication: Mother updated by phone  Disposition: The patient was admitted with MSSA bacteremia, tricuspid valve endocarditis, lesion greater than 1 cm.  She also has acute on chronic renal failure, which appears to be resolving to her chronic stage IV kidney disease.  We will continue diuresis, monitor BUN.  Progress towards tracheostomy now and LTAC.  I am uncertain if medical therapy alone can eradicate her  endocarditis.  I am also unclear if she were able to rehabilitate and decannulate the trachea (and adhere to her treatment plan faithfully), would she be able to be reconsidered for valve surgery?       Labs   CBC: Recent Labs  Lab 01/29/19 0526 01/30/19 0238 01/30/19 1107 01/31/19 0411 02/01/19 0508 02/02/19 0453  WBC 4.7 3.3*  --  3.5* 3.9* 2.9*  NEUTROABS  --  2.7  --  2.9  --   --   HGB 7.5* 6.8* 8.0* 7.7* 8.3* 7.9*  HCT 23.9* 22.0* 26.0* 24.3* 26.2* 27.0*  MCV 86.9 88.4  --  88.0 88.5 92.2  PLT 58* 71*  --  78* 100* 95*    Basic Metabolic Panel: Recent Labs  Lab 01/27/19 0400 01/28/19 0340 01/29/19  0234 01/30/19 0238 01/31/19 0411 01/31/19 1300 02/01/19 0508 02/02/19 0453  NA 142 144 144 143  --  145 147* 149*  K 4.2 4.5 4.7 4.3  --  3.0* 3.5 3.8  CL 101 105 105 105  --  103 104 108  CO2 24 28 28 26   --  30 29 29   GLUCOSE 137* 137* 138* 137*  --  131* 106* 150*  BUN 160* 159* 162* 157*  --  155* 146* 135*  CREATININE 4.73* 4.18* 4.03* 3.60*  --  3.51* 3.09* 2.86*  CALCIUM 6.9* 6.9* 6.6* 6.6*  --  6.8* 7.3* 7.4*  MG 2.1 2.2 2.1 2.1 2.2  --   --   --   PHOS 8.8* 8.8* 9.1* 8.8* 7.7*  --   --   --    GFR: Estimated Creatinine Clearance: 21.5 mL/min (A) (by C-G formula based on SCr of 2.86 mg/dL (H)). Recent Labs  Lab 01/28/19 0340  01/30/19 0238 01/31/19 0411 02/01/19 0508 02/02/19 0453  WBC 3.4*   < > 3.3* 3.5* 3.9* 2.9*  LATICACIDVEN 1.3  --   --   --   --   --    < > = values in this interval not displayed.    Liver Function Tests: Recent Labs  Lab 01/28/19 0340 02/01/19 0508 02/02/19 0453  AST 23 19 22   ALT <5 <5 <5  ALKPHOS 120 117 100  BILITOT 0.6 0.5 0.3  PROT 6.5 7.7 7.3  ALBUMIN <1.0* 1.1* 1.1*   No results for input(s): LIPASE, AMYLASE in the last 168 hours. No results for input(s): AMMONIA in the last 168 hours.  ABG    Component Value Date/Time   PHART 7.270 (L) 01/20/2019 1559   PCO2ART 38.0 01/20/2019 1559   PO2ART 90.0  01/20/2019 1559   HCO3 17.4 (L) 01/20/2019 1559   TCO2 19 (L) 01/20/2019 1559   ACIDBASEDEF 9.0 (H) 01/20/2019 1559   O2SAT 96.0 01/20/2019 1559     Coagulation Profile: Recent Labs  Lab 02/02/19 0950  INR 1.7*    Cardiac Enzymes: No results for input(s): CKTOTAL, CKMB, CKMBINDEX, TROPONINI in the last 168 hours.  HbA1C: No results found for: HGBA1C  CBG: Recent Labs  Lab 02/02/19 0015 02/02/19 0425 02/02/19 0753 02/02/19 1123 02/02/19 1530  GLUCAP 109* 127* 131* 115* 90    Review of Systems:   Unable to assess given intubated  Past Medical History  She,  has a past medical history of Abscess in epidural space of T12/L1 spine, Abscess of skin, CKD (chronic kidney disease) stage 3, GFR 30-59 ml/min ([place]), Drug-seeking behavior, Hypersplenism, IV drug user, MRSA (methicillin resistant Staphylococcus aureus) infection, MRSA bacteremia, Pancytopenia ([place]), 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), Septic pulmonary embolism ([place]), and Tobacco abuse.   Surgical History    Past Surgical History:  Procedure Laterality Date  . IR GENERIC HISTORICAL  07/19/2016   IR LUMBAR DISC ASPIRATION W/IMG GUIDE 07/19/2016 Arne Cleveland, MD [facility]-INTERV RAD  . RADIOLOGY WITH ANESTHESIA N/A 04/11/2018   Procedure: MRI OF LUMBAR AND THORACIC SPINE WITHOUT CONTRAST WITH ANESTHESIA;  Surgeon: Radiologist, Medication, MD;  Location: [facility];  Service: Radiology;  Laterality: N/A;  . TEE WITHOUT CARDIOVERSION N/A 07/04/2013   Procedure: TRANSESOPHAGEAL ECHOCARDIOGRAM (TEE);  Surgeon: Larey Dresser, MD;  Location: [facility] ENDOSCOPY;  Service: Cardiovascular;  Laterality: N/A;  . TONSILLECTOMY     33 years old     Social History   reports that she has been smoking cigarettes. She has  a 3.50 pack-year smoking history. She has never used smokeless tobacco. She reports current drug use. Drugs: IV and Oxycodone. She reports that she does not drink alcohol.   Family History   Her family history includes  Alcoholism in her father.   Allergies Allergies  Allergen Reactions  . Vancomycin Other (See Comments)    Possible contributor to AKI and thrombocytopenia     Home Medications  Prior to Admission medications   Medication Sig Start Date End Date Taking? Authorizing Provider  ibuprofen (ADVIL,MOTRIN) 200 MG tablet Take 200-400 mg by mouth every 6 (six) hours as needed for moderate pain.    Yes [provider]  senna-docusate (SENOKOT-S) 8.6-50 MG tablet Take 1 tablet by mouth at bedtime as needed for mild constipation. Patient not taking: Reported on 07/12/2017 07/21/16   Florinda Marker, MD     Critical care time: 40 minutes  The patient is critically ill with multi-organ failure.  Critical care was necessary to treat or prevent imminent or life-threatening deterioration of sepsis, respiratory failure, cardiac failure, renal failure and was exclusive of separately billable procedures and treating other patients.  Time spent personally by me on obtaining history from patient or surrogate, evaluation of the patient, evaluation of patient's response to treatment, ordering and review of laboratory studies, ordering and review of radiographic studies, ordering and performing treatments and interventions, and re-evaluation of the patient's condition.

## 2019-02-02 NOTE — Progress Notes (Signed)
Noted consult for LTAC. Patient with no insurance - No LTAC benefit.   Manya Silvas, RN CM Transitions of Care [address] [place]

## 2019-02-02 NOTE — Evaluation (Signed)
Physical Therapy Evaluation Patient Details Name: Molly [name] MRN: [id number] DOB: 1986-10-04 Today's Date: 02/02/2019   History of Present Illness  Pt adm for acute respiratory failure due to Septic pulmonary embolus due to MRSA tricuspid vegetation. Pt intubated 01/20/19. PMH - polysubstance abuse, inadequately treated MRSA endocarditis having signed out AMA  in July 2019, osteomyelitis of spine  Clinical Impression  Pt presents to PT with decr mobility due to deconditioning after extended hospitalization. Pt to have trach placed later today which should make mobility easier. Expect she will make good progress with mobility. Will make post acute recommendations after seeing how she is mobilizing and progressing.     Follow Up Recommendations Other (comment)(TBA after mobilizing)    Equipment Recommendations  Other (comment)(To be determined)    Recommendations for Other Services       Precautions / Restrictions Precautions Precautions: Fall;Other (comment) Precaution Comments: vent Restrictions Weight Bearing Restrictions: No      Mobility  Bed Mobility               General bed mobility comments: Not tested  Transfers                 General transfer comment: Not tested  Ambulation/Gait                Stairs            Wheelchair Mobility    Modified Rankin (Stroke Patients Only)       Balance                                             Pertinent Vitals/Pain Pain Assessment: Faces Faces Pain Scale: No hurt    Home Living Family/patient expects to be discharged to:: Private residence Living Arrangements: Parent               Additional Comments: Pt unable to give details of set up    Prior Function Level of Independence: Independent               Hand Dominance        Extremity/Trunk Assessment   Upper Extremity Assessment Upper Extremity Assessment: Generalized weakness    Lower  Extremity Assessment Lower Extremity Assessment: Generalized weakness       Communication   Communication: Other (comment)(intubated)  Cognition Arousal/Alertness: Awake/alert Behavior During Therapy: Restless Overall Cognitive Status: Difficult to assess                                 General Comments: Pt following commands      General Comments      Exercises General Exercises - Upper Extremity Shoulder Flexion: AAROM;10 reps;Both;Supine General Exercises - Lower Extremity Ankle Circles/Pumps: AROM;Both;10 reps;Supine Short Arc Quad: AAROM;Both;10 reps;Supine Heel Slides: AAROM;Both;10 reps;Supine Hip ABduction/ADduction: AAROM;Both;10 reps;Supine Straight Leg Raises: AAROM;Both;10 reps;Supine   Assessment/Plan    PT Assessment Patient needs continued PT services  PT Problem List Decreased strength;Decreased activity tolerance;Decreased balance;Decreased mobility       PT Treatment Interventions DME instruction;Gait training;Functional mobility training;Therapeutic activities;Therapeutic exercise;Balance training;Patient/family education    PT Goals (Current goals can be found in the Care Plan section)  Acute Rehab PT Goals Patient Stated Goal: Pt nodding agreement about being able to mobilize  PT Goal Formulation: With patient Time For Goal  Achievement: 02/16/19 Potential to Achieve Goals: Good    Frequency Min 3X/week   Barriers to discharge        Co-evaluation               AM-PAC PT "6 Clicks" Mobility  Outcome Measure Help needed turning from your back to your side while in a flat bed without using bedrails?: A Little Help needed moving from lying on your back to sitting on the side of a flat bed without using bedrails?: A Lot Help needed moving to and from a bed to a chair (including a wheelchair)?: Total Help needed standing up from a chair using your arms (e.g., wheelchair or bedside chair)?: Total Help needed to walk in  hospital room?: Total Help needed climbing 3-5 steps with a railing? : Total 6 Click Score: 9    End of Session   Activity Tolerance: Patient tolerated treatment well Patient left: in bed;with call bell/phone within reach   PT Visit Diagnosis: Other abnormalities of gait and mobility (R26.89);Muscle weakness (generalized) (M62.81)    Time: 8875-7972 PT Time Calculation (min) (ACUTE ONLY): 14 min   Charges:   PT Evaluation $PT Eval Moderate Complexity: [place] Pager [phone] Office [place] 02/02/2019, 12:35 PM

## 2019-02-02 NOTE — Progress Notes (Addendum)
Pt continues to bleed around trach since placement @1400 .  New gauze placed at 1745 and now completely soaked w/ bright red blood.  Ordered thrombi pad and relayed to Dr. Duwayne Heck. Plan to transfuse platelets.

## 2019-02-02 NOTE — Progress Notes (Signed)
Left another vm for pts mom re: consents for trach and peg. Requested call back.

## 2019-02-02 NOTE — Procedures (Signed)
Intubation Procedure Note Molly [name] [id number] September 20, 1986  Procedure: Intubation Indications: Airway protection and maintenance  Procedure Details Consent: Unable to obtain consent because of emergent medical necessity. Time Out: Verified patient identification, verified procedure, site/side was marked, verified correct patient position, special equipment/implants available, medications/allergies/relevent history reviewed, required imaging and test results available.  Performed  Maximum sterile technique was used including antiseptics, cap, hand hygiene and mask.  MAC    Evaluation Hemodynamic Status: BP stable throughout; O2 sats: stable throughout Patient's Current Condition: stable Complications: No apparent complications Patient did tolerate procedure well. Chest X-ray ordered to verify placement.  CXR: pending.   Molly [name] 02/02/2019

## 2019-02-02 NOTE — Progress Notes (Addendum)
NAME:  Molly [name], MRN:  [id number], DOB:  08-28-1986, LOS: 86 ADMISSION DATE:  01/20/2019, CONSULTATION DATE: 01/20/2019 REFERRING MD:  Bonner Puna, CHIEF COMPLAINT:  Sepsis  Brief History   33 y/o F with a history of IV drug abuse/polysubstance abuse (presented with urine drug screen positive for opiates and amphetamines) and a previous history in June or July 2019 of inadequately treated MRSA tricuspid valve endocarditis having signed out AMA at that time.    Admitted 4/4, altered, hypotensive and in respiratory distress requiring intubation at [facility].  Also LE edema and petechiae on admission.   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 Awake / FC. Looks best in many days. Failed SBT 4/12 Failed SBT.  AKI improving, tx PRBC.  Less alert on precedex, methadone  4/13 More alert, follows commands. On precedex. Failed SBT. Diuresing +9L since admit . ID recommending PICC for antibiotcs > mother not sure about goals of care. S/p 1 unit PRBC    Consults:  Nephrology  Procedures:  ETT 4/4 >>  R Femoral line 4/4 >> 4/8  Significant Diagnostic Tests:  Echocardiogram 4/4 >> TV vegetation measures 1.5 mm x 0.6 mm., severe TR CT scan of the chest4/4 Bilateral pulmonary nodules, many of which are peripheral and cavitated.   Micro Data:  Blood cultures x2 4/4 >> MSSA  BCx2 4/8 >> negative     Antimicrobials:  Vancomycin Zosyn Teflaro >> stopped Ancef 4/4 >>  Interim history/subjective:  Afebrile, not weaning .  Awake and alert this am, f/c well .  For Trach today .  Received lasix 40 today at 920   Objective   Blood pressure (!) 117/94, pulse 76, temperature 98.8 F (37.1 C), resp. rate 15, height 5\' 1"  (1.549 m), weight 53 kg, SpO2 100 %.    Vent Mode: PRVC FiO2 (%):  [30 %] 30 % Set Rate:  [15 bmp] 15 bmp Vt Set:  [450 mL] 450 mL PEEP:  [5 cmH20] 5 cmH20 Plateau Pressure:  [16 cmH20-18 cmH20] 17 cmH20   Intake/Output Summary (Last 24 hours) at 02/02/2019 1028 Last data filed at 02/02/2019 1000 Gross per 24 hour  Intake 2306.91 ml  Output 1075 ml  Net 1231.91 ml   Filed Weights   01/31/19 0500 02/01/19 0500 02/02/19 0500  Weight: 56 kg 54.8 kg 53 kg    General: thin, nad HEENT: MM pink/moist, ETT, dry facial skin Neuro: alert awake CV: s1s2 rrr, no m/r/g PULM: even/non-labored, lungs bilaterally clear GH:WEXH, non-tender NBS Extremities: warm/dry,   LABS    PULMONARY No results for input(s): PHART, PCO2ART, PO2ART, HCO3, TCO2, O2SAT in the last 168 hours.  Invalid input(s): PCO2, PO2  CBC Recent Labs  Lab 01/31/19 0411 02/01/19 0508 02/02/19 0453  HGB 7.7* 8.3* 7.9*  HCT 24.3* 26.2* 27.0*  WBC 3.5* 3.9* 2.9*  PLT 78* 100* 95*    COAGULATION No results for input(s): INR in the last 168 hours.  CARDIAC  No results for input(s): TROPONINI in the last 168 hours. No results for input(s): PROBNP in the last 168 hours.   CHEMISTRY Recent Labs  Lab 01/27/19 0400 01/28/19 0340 01/29/19 0234 01/30/19 0238 01/31/19 0411 01/31/19 1300 02/01/19 0508 02/02/19 0453  NA 142 144 144 143  --  145 147* 149*  K 4.2 4.5 4.7 4.3  --  3.0* 3.5 3.8  CL 101 105 105 105  --  103 104 108  CO2 24 28 28 26   --  30 29 29   GLUCOSE 137* 137* 138* 137*  --  131* 106* 150*  BUN 160* 159* 162* 157*  --  155* 146* 135*  CREATININE 4.73* 4.18* 4.03*  3.60*  --  3.51* 3.09* 2.86*  CALCIUM 6.9* 6.9* 6.6* 6.6*  --  6.8* 7.3* 7.4*  MG 2.1 2.2 2.1 2.1 2.2  --   --   --   PHOS 8.8* 8.8* 9.1* 8.8* 7.7*  --   --   --    Estimated Creatinine Clearance: 21.5 mL/min (A) (by C-G formula based on SCr of 2.86 mg/dL (H)).   LIVER Recent Labs  Lab 01/28/19 0340 02/01/19 0508 02/02/19 0453  AST 23 19 22   ALT <5 <5 <5  ALKPHOS 120 117 100  BILITOT 0.6 0.5 0.3  PROT 6.5 7.7 7.3  ALBUMIN <1.0* 1.1* 1.1*     INFECTIOUS Recent Labs  Lab 01/28/19 0340  LATICACIDVEN 1.3     ENDOCRINE CBG (last 3)  Recent Labs    02/02/19 0015 02/02/19 0425 02/02/19 0753  GLUCAP 109* 127* 131*    IMAGING   Dg Chest Port 1 View  Result Date: 02/01/2019 CLINICAL DATA:  Respiratory difficulty EXAM: PORTABLE CHEST 1 VIEW COMPARISON:  01/30/2019 FINDINGS: Endotracheal and NG tubes are stable. Normal heart size. Low lung volumes. Bilateral patchy ill-defined opacities are not significantly changed. Diaphragms are better visualized likely due to layering pleural fluid bilaterally. Improvement in the pleural effusions may be present. Bilateral cavitary lung lesions are stable. IMPRESSION: Stable endotracheal and NG tubes. Bilateral patchy airspace opacities are not significantly changed. Bilateral cavitary lung lesions are stable. Improvement in bilateral pleural effusions is suspected. Electronically Signed   By: Marybelle Killings M.D.   On: 02/01/2019 08:22    Resolved Hospital Problem list    Septic shock 2/2 MSSA bacteremia - Resolved Acute hypoxemic respiratory failure - now on minimal vent settings.     A/P MSSA Bacteremia, infective endocarditis  -abx started 4/8. Now hemodynamically stable.  Severe Tricuspid Regurgitation  P: ICU monitoring  Continue Cefazolin Plan to continue abx for 6 weeks Consider PICC, ID requesting for abx.  Will need to discuss plan / [place] with mother. LCB - no CPR   Respiratory Failure, persistent failure to wean from  vent:  On minimal vent settings but becomes profoundly apneic despite being awake on SBT.  Fails SBT due to apnea.  No sedating/respiratory depressing meds. Only methadone, fairly low dose, weaning down.   Cont  PRVC 8cc/kg  Cont PEEP / FiO2 for sats >90% Follow intermittent CXR Consulted for for trach today   AKI -"not an HD candidate" -scr tr down w/ good UOP  Hypernatremia  P: Increase Free water 250-q8  Trend BMP / urinary output Replace electrolytes as indicated Avoid nephrotoxic agents, ensure adequate renal perfusion  At Risk Malnutrition  P: Continue TF per Nutrition  Anemia  -of critical illness-hbg stable 8.3?7.9  Thrombocytopenia -plt stable 100>95k  INR 1.7  P: Trend  CBC  Transfuse per ICU guidelines   Intermittent Agitation  -CT head negative -MRI brain / spine deferred  P:   Continue methadone, now on 10mg   qd RASS Goal: 0 to -1   Best practice:  Diet: TF Pain/Anxiety/Delirium protocol (if indicated): prn VAP protocol (if indicated): in place DVT prophylaxis: scd GI prophylaxis: PPI Glucose control: protocol  Mobility: as tolerated Code Status: Limited code Family Communication: - prior discussion 4/6- no cvvh, no cpr, no mri - continue full medical care otherwise. Updated 4/8. On 4/11 - LMTCB. On 02-01-2019 - mom's cell appears dead. RN also reports difficulty getting hold of mom BOTH ACCEPTING trach and peg 4/15 meeting (per palliative notes) Disposition: ICU   CC Time:     Tammy Parrett NP-C  [facility]  [phone]  02/02/2019      Attending MD note  Patient was independently seen and examined, treatment plan was discussed with the  Advance Practice Provider. I agree with the above note by NP Parrett.  I have personally reviewed the clinical findings, labs, ECG, imaging studies and management of this patient in detail. I have also reviewed the orders written for this patient which were under my direction. I agree  with the documentation, as recorded by the Advance Practice Provider.   Briefly, Molly [name] is a 33 y.o. female with MSSA bacteremia, septic shock, AKI possibly 2/2 renal septic emboli, septic pulmonary emboli.    Received lasix 40 today at 920.  Plan for trach today.   Subjective: No complaints.  Aware of upcoming procedure.   Objective: Vitals:   02/02/19 0800 02/02/19 0900  BP:    Pulse: 83 76  Resp: 18 15  Temp: 98.8 F (37.1 C) 98.8 F (37.1 C)  SpO2: 100% 100%    FiO2 (%):  [30 %] 30 % No intake or output data in the 24 hours ending 08/16/18 2358  General:  NAD, thin, poor skin turgor Neuro:  Awake alert, appropriate HEENT:  [place]/AT, No JVD noted, PERRL Cardiovascular:  RRR, no MRG Lungs: CTAB  Abdomen:  Soft, non-distended Musculoskeletal:  No acute deformity Skin:  Intact, MMM  CXR images   Impression/Plan: 33 year old woman with IVDU hx, MSSA endocarditis, hypoxic resp failure, now persistent vent dependence (apnea on SBT).    #respiratory failure:  Grossly weak and debilitated after 2 weeks on vent.  Plan for perc tracheostomy today.  Needs PT, nutrition. Will need PEG consult.    Azotemia, AKI, BUN trending down. Cr trending down.  Hypernatremia Contraction alkalosis I/o +500 past 24, today -900 Holding further lasix.    Hypoalbuminemia 1.1  Leukopenia Anemia normocytic Thrombocytopenia resolving.  Possibly DIC vs 2/2 sepsis.   Elevated INR  This patient is critically ill, requiring high complexity decision making for assessment and plan, frequent evaluation, application of advanced monitoring and extensive interpretations of multiple databases.    Critical Care time devoted to patient care services described in this note is 35 minutes, not including time spent on procedures, teaching or supervising.    Collier Bullock, MD

## 2019-02-02 NOTE — Progress Notes (Signed)
SLP Cancellation Note  Patient Details Name: Molly [name] MRN: [id number] DOB: 1986-01-17   Cancelled treatment:       Reason Eval/Treat Not Completed: Other (comment) Patient with new tracheostomy. Orders for SLP eval and treat for PMSV and swallowing received. Will follow pt closely for readiness for SLP interventions as appropriate.     Venita Sheffield Braian Tijerina 02/02/2019, 3:35 PM   [name], M.A. [place] Acute Environmental education officer [phone] Office [phone]

## 2019-02-02 NOTE — Progress Notes (Signed)
Assisted tele visit to patient with mother.  Simi Briel, Aliene Beams, RN

## 2019-02-02 NOTE — Progress Notes (Signed)
Still awaiting Rh - platelets. Plasma infusing now and DDAVP given. Patient continues to have bleeding around trach.  Pt is grimacing and in clear discomfort. HR elevated to 120. ELink paged for PRN pain meds.

## 2019-02-02 NOTE — Procedures (Signed)
Bronchoscopy Procedure Note Molly [name] [id number] [birth year]  Procedure: Bronchoscopy Indications: tracheostomy placement   Procedure Details Consent: Risks of procedure as well as the alternatives and risks of each were explained to the (patient/caregiver).  Consent for procedure obtained. Time Out: Verified patient identification, verified procedure, site/side was marked, verified correct patient position, special equipment/implants available, medications/allergies/relevent history reviewed, required imaging and test results available.  Performed  In preparation for procedure, patient was given 100% FiO2 and bronchoscope lubricated. Sedation: Benzodiazepines, Etomidate and Short-acting barbiturates  Airway entered Procedure done by P Birtha Hatler ACNP-BC, under direct supervision of Dr Nelda Marseille. At first bronch was introduce through ET tube and structures of tracheal rings, carina identified for operator of tracheostomy who was Dr Nelda Marseille . Light of bronch passed through trachea and skin for indentification of tracheal rings for tracheostomy puncture. After this, under bronchoscopy guidance,  ET tube was pulled back sufficiently and very carefully. The ET tube was  pulled back enough to give room for tracheostomy operator and yet at same time to to ensure a secured airway. After this was accomplished, bronchoscope was withdrawn into the ET tube. After this,  Dr Nelda Marseille  then performed tracheostomy under video visual provided by flexible video bronchoscopy. Followng introduction of tracheostomy,  the bronchoscope was removed from ET tube and introduced through tracheostomy. Correct position of tracheostomy was ensured, with enough room between carina and distal tracheostomy and no evidence of bleeding. The bronchoscope was then withdrawn. Respiratory therapist was then instructed to remove the ET tube.  Dr Nelda Marseille  then proceeded to complete the tracheostomy with stay sutures   No complications   Evaluation Hemodynamic Status: BP stable throughout; O2 sats: transiently fell during during procedure Patient's Current Condition: stable Specimens:  None Complications: No apparent complications Patient did tolerate procedure well.   Clementeen Graham 02/02/2019   Erick Colace ACNP-BC [place] Pager # [phone] OR # [phone] if no answer

## 2019-02-02 NOTE — Procedures (Addendum)
Cortrak  Tube Type:  Cortrak - 43 inches Tube Location:  Left nare Initial Placement:  Postpyloric Secured by: Bridle Technique Used to Measure Tube Placement:  Documented cm marking at nare/ corner of mouth Cortrak Secured At:  90 cm    Cortrak Tube Team Note:  Consult received to place a Cortrak feeding tube.   Platelets 95,000; ok to place Bridle per MD  No x-ray is required. RN may begin using tube.   If the tube becomes dislodged please keep the tube and contact the Cortrak team at [email or web address] (password [email or web address]) for replacement.  If after hours and replacement cannot be delayed, place a NG tube and confirm placement with an abdominal x-ray.   Koleen Distance MS, RD, LDN Pager #- [phone] Office#- [phone] After Hours Pager: [phone]

## 2019-02-02 NOTE — Procedures (Signed)
Percutaneous Tracheostomy Placement  Consent from family.  Patient sedated, paralyzed and position.  Placed on 100% FiO2 and RR matched.  Area cleaned and draped.  Lidocaine/epi injected.  Skin incision done followed by blunt dissection.  Trachea palpated then punctured, catheter passed and visualized bronchoscopically.  Wire placed and visualized.  Catheter removed.  Airway then entered and dilated.  Size 6 cuffed shiley trach placed and visualized bronchoscopically well above carina.  Good volume returns.  Patient tolerated the procedure well without complications.  Minimal blood loss.  CXR ordered and pending.  During procedure, we had to reintubate the patient  Rush Farmer, M.D. [facility] [place] [facility]/Critical Care Medicine. Pager: [phone]. After hours pager: [phone].

## 2019-02-03 ENCOUNTER — Inpatient Hospital Stay: Payer: Self-pay

## 2019-02-03 DIAGNOSIS — D72819 Decreased white blood cell count, unspecified: Secondary | ICD-10-CM

## 2019-02-03 LAB — COMPREHENSIVE METABOLIC PANEL
ALT: 5 U/L (ref 0–44)
AST: 20 U/L (ref 15–41)
Albumin: 1.4 g/dL — ABNORMAL LOW (ref 3.5–5.0)
Alkaline Phosphatase: 87 U/L (ref 38–126)
Anion gap: 12 (ref 5–15)
BUN: 119 mg/dL — ABNORMAL HIGH (ref 6–20)
CO2: 31 mmol/L (ref 22–32)
Calcium: 7.8 mg/dL — ABNORMAL LOW (ref 8.9–10.3)
Chloride: 109 mmol/L (ref 98–111)
Creatinine, Ser: 2.45 mg/dL — ABNORMAL HIGH (ref 0.44–1.00)
GFR calc Af Amer: 29 mL/min — ABNORMAL LOW (ref >60)
GFR calc non Af Amer: 25 mL/min — ABNORMAL LOW (ref >60)
Glucose, Bld: 120 mg/dL — ABNORMAL HIGH (ref 70–99)
Potassium: 3.2 mmol/L — ABNORMAL LOW (ref 3.5–5.1)
Sodium: 152 mmol/L — ABNORMAL HIGH (ref 135–145)
Total Bilirubin: 0.5 mg/dL (ref 0.3–1.2)
Total Protein: 7.2 g/dL (ref 6.5–8.1)

## 2019-02-03 LAB — CBC WITH DIFFERENTIAL/PLATELET
Abs Immature Granulocytes: 0.01 10*3/uL (ref 0.00–0.07)
Basophils Absolute: 0 10*3/uL (ref 0.0–0.1)
Basophils Relative: 1 %
Eosinophils Absolute: 0 10*3/uL (ref 0.0–0.5)
Eosinophils Relative: 0 %
HCT: 24 % — ABNORMAL LOW (ref 36.0–46.0)
Hemoglobin: 7.3 g/dL — ABNORMAL LOW (ref 12.0–15.0)
Immature Granulocytes: 1 %
Lymphocytes Relative: 27 %
Lymphs Abs: 0.5 10*3/uL — ABNORMAL LOW (ref 0.7–4.0)
MCH: 28.3 pg (ref 26.0–34.0)
MCHC: 30.4 g/dL (ref 30.0–36.0)
MCV: 93 fL (ref 80.0–100.0)
Monocytes Absolute: 0.2 10*3/uL (ref 0.1–1.0)
Monocytes Relative: 8 %
Neutro Abs: 1.3 10*3/uL — ABNORMAL LOW (ref 1.7–7.7)
Neutrophils Relative %: 63 %
Platelets: 82 10*3/uL — ABNORMAL LOW (ref 150–400)
RBC: 2.58 MIL/uL — ABNORMAL LOW (ref 3.87–5.11)
RDW: 17.4 % — ABNORMAL HIGH (ref 11.5–15.5)
WBC: 2 10*3/uL — ABNORMAL LOW (ref 4.0–10.5)
nRBC: 0 % (ref 0.0–0.2)

## 2019-02-03 LAB — FIBRINOGEN: Fibrinogen: 292 mg/dL (ref 210–475)

## 2019-02-03 LAB — PREPARE PLATELET PHERESIS: Unit division: 0

## 2019-02-03 LAB — BPAM FFP
Blood Product Expiration Date: 202004182359
ISSUE DATE / TIME: 202004172025
Unit Type and Rh: 600

## 2019-02-03 LAB — GLUCOSE, CAPILLARY
Glucose-Capillary: 101 mg/dL — ABNORMAL HIGH (ref 70–99)
Glucose-Capillary: 103 mg/dL — ABNORMAL HIGH (ref 70–99)
Glucose-Capillary: 106 mg/dL — ABNORMAL HIGH (ref 70–99)
Glucose-Capillary: 107 mg/dL — ABNORMAL HIGH (ref 70–99)
Glucose-Capillary: 110 mg/dL — ABNORMAL HIGH (ref 70–99)
Glucose-Capillary: 110 mg/dL — ABNORMAL HIGH (ref 70–99)
Glucose-Capillary: 115 mg/dL — ABNORMAL HIGH (ref 70–99)

## 2019-02-03 LAB — CBC
HCT: 20.8 % — ABNORMAL LOW (ref 36.0–46.0)
Hemoglobin: 6.2 g/dL — CL (ref 12.0–15.0)
MCH: 28.1 pg (ref 26.0–34.0)
MCHC: 29.8 g/dL — ABNORMAL LOW (ref 30.0–36.0)
MCV: 94.1 fL (ref 80.0–100.0)
Platelets: 90 10*3/uL — ABNORMAL LOW (ref 150–400)
RBC: 2.21 MIL/uL — ABNORMAL LOW (ref 3.87–5.11)
RDW: 15.6 % — ABNORMAL HIGH (ref 11.5–15.5)
WBC: 1.9 10*3/uL — ABNORMAL LOW (ref 4.0–10.5)
nRBC: 0 % (ref 0.0–0.2)

## 2019-02-03 LAB — PREPARE FRESH FROZEN PLASMA: Unit division: 0

## 2019-02-03 LAB — BPAM PLATELET PHERESIS
Blood Product Expiration Date: 202004202359
ISSUE DATE / TIME: 202004172307
Unit Type and Rh: 9500

## 2019-02-03 LAB — PREPARE RBC (CROSSMATCH)

## 2019-02-03 LAB — D-DIMER, QUANTITATIVE: D-Dimer, Quant: 5.98 ug/mL-FEU — ABNORMAL HIGH (ref 0.00–0.50)

## 2019-02-03 LAB — PROTIME-INR
INR: 1.5 — ABNORMAL HIGH (ref 0.8–1.2)
Prothrombin Time: 18.3 seconds — ABNORMAL HIGH (ref 11.4–15.2)

## 2019-02-03 MED ORDER — "THROMBI-PAD 3""X3"" EX PADS"
2.0000 | MEDICATED_PAD | Freq: Once | CUTANEOUS | Status: AC
  Administered 2019-02-03: 2 via TOPICAL
  Filled 2019-02-03: qty 2

## 2019-02-03 MED ORDER — RHO D IMMUNE GLOBULIN 1500 UNIT/2ML IJ SOSY
300.0000 ug | PREFILLED_SYRINGE | Freq: Once | INTRAMUSCULAR | Status: DC
  Filled 2019-02-03: qty 2

## 2019-02-03 MED ORDER — SODIUM CHLORIDE 0.9% IV SOLUTION: Freq: Once | INTRAVENOUS | Status: DC

## 2019-02-03 MED ORDER — FREE WATER
250.0000 mL | Freq: Four times a day (QID) | Status: DC
  Administered 2019-02-03: 12:00:00 250 mL

## 2019-02-03 MED ORDER — FREE WATER
350.0000 mL | Freq: Four times a day (QID) | Status: DC
  Administered 2019-02-03 – 2019-02-04 (×3): 350 mL

## 2019-02-03 MED ORDER — VITAMIN K1 10 MG/ML IJ SOLN
10.0000 mg | Freq: Once | INTRAVENOUS | Status: DC
  Filled 2019-02-03: qty 1

## 2019-02-03 MED ORDER — ACETAMINOPHEN 160 MG/5ML PO SOLN
650.0000 mg | Freq: Four times a day (QID) | ORAL | Status: DC
  Administered 2019-02-03 – 2019-02-27 (×95): 650 mg
  Filled 2019-02-03 (×98): qty 20.3

## 2019-02-03 MED ORDER — "THROMBI-PAD 3""X3"" EX PADS"
1.0000 | MEDICATED_PAD | Freq: Once | CUTANEOUS | Status: AC
  Administered 2019-02-03: 1 via TOPICAL
  Filled 2019-02-03: qty 1

## 2019-02-03 MED ORDER — FENTANYL 50 MCG/HR TD PT72
1.0000 | MEDICATED_PATCH | TRANSDERMAL | Status: DC
  Administered 2019-02-03 – 2019-03-05 (×12): 1 via TRANSDERMAL
  Filled 2019-02-03 (×13): qty 1

## 2019-02-03 MED ORDER — ACETAMINOPHEN 325 MG PO TABS: 650.0000 mg | ORAL_TABLET | Freq: Four times a day (QID) | ORAL | Status: DC

## 2019-02-03 MED ORDER — HYDROMORPHONE HCL 1 MG/ML IJ SOLN
1.0000 mg | INTRAMUSCULAR | Status: DC | PRN
  Administered 2019-02-03 – 2019-02-07 (×9): 1 mg via INTRAVENOUS
  Filled 2019-02-03 (×9): qty 1

## 2019-02-03 MED ORDER — "THROMBI-PAD 3""X3"" EX PADS"
1.0000 | MEDICATED_PAD | Freq: Once | CUTANEOUS | Status: DC
  Filled 2019-02-03: qty 1

## 2019-02-03 MED ORDER — POTASSIUM CHLORIDE 20 MEQ PO PACK
40.0000 meq | PACK | Freq: Once | ORAL | Status: AC
  Administered 2019-02-03: 40 meq via NASOGASTRIC
  Filled 2019-02-03: qty 2

## 2019-02-03 MED ORDER — HYDROMORPHONE BOLUS VIA INFUSION: 1.0000 mg | INTRAVENOUS | Status: DC | PRN

## 2019-02-03 MED ORDER — ETOMIDATE 2 MG/ML IV SOLN
INTRAVENOUS | Status: AC
  Administered 2019-02-03: 20 mg
  Filled 2019-02-03: qty 10

## 2019-02-03 NOTE — Progress Notes (Signed)
SLP Cancellation Note  Patient Details Name: Molly [name] MRN: [id number] DOB: 06-01-1986   Cancelled treatment:        C/S pmv evaluation, pt back on ventilator.   Charlynne Cousins Alizabeth Antonio, MA, CCC-SLP 02/03/2019 2:42 PM

## 2019-02-03 NOTE — Progress Notes (Signed)
Spoke with Everlene Other RN re PICC to be placed on 02/04/19.  States has working PIV at this time. Troyce RN to attempt to ge [facility] consent when speaks with the pts mother.

## 2019-02-03 NOTE — Progress Notes (Signed)
CRITICAL VALUE ALERT  Critical Value:  Hgb 6.2  Date & Time Notied:  02/02/2018 0402  Provider Notified: Warren Lacy

## 2019-02-03 NOTE — Progress Notes (Addendum)
PROGRESS NOTE    Molly [name]  JAS:[id number] DOB: 1985-12-12 DOA: 01/20/2019 PCP: Patient, No Pcp Per    Brief Narrative:  33 year old female who presented with altered mentation.  She does have significant past medical history of intravenous drug use and polysubstance abuse.  Patient was found altered by her family, on her initial physical examination she was hypotensive, systolic in the 41P to 37T, tachycardic 120s beats per minute, he was altered and agitated, lungs were clear to auscultation, heart S1-S2 present rhythmic, abdomen was soft, she had lower extremities petechiae and ecchymosis in her toes.  Sodium 130, potassium 5.0, chloride 95, bicarb 16, glucose 85, creatinine 7.12, AST 62, ALT 22.  White count 8.3, hemoglobin 8.2, hematocrit 35.3, platelets 21.  Urinalysis at 25-50 white cells, 21-50 red cells, 100 protein.  Her x-ray had bilateral multiple infiltrates.   Patient was admitted to the hospital with a working diagnosis of sepsis with multiorgan failure and disseminated intravascular coagulation.   Patient's condition deteriorated and required invasive mechanical ventilation.  She was found to have septic emboli on CT chest, right base pneumonia, her blood cultures grew MSSA, echocardiography showed tricuspid valve endocarditis.  Patient has been on antibiotic therapy, cefazolin.  Underwent tracheostomy April 17.   Assessment & Plan:   Principal Problem:   Sepsis with multiple organ dysfunction (MOD) (HCC) Active Problems:   Intravenous drug abuse, continuous (HCC)   Acute respiratory failure (HCC)   Chronic anemia   Polysubstance abuse ([place])   Encounter for orogastric (OG) tube placement   Acute kidney injury superimposed on chronic kidney disease ([place])   MSSA bacteremia   Acute septic pulmonary embolism with acute cor pulmonale (June 2019)   Acute encephalopathy   Thrombocytopenia (HCC)   Sepsis with disseminated intravascular coagulopathy (DIC) ([place])   Sepsis  ([place])   Acute cystitis with hematuria   DIC (disseminated intravascular coagulation) ([place])   Multifocal pneumonia   Goals of care, counseling/discussion   Advanced care planning/counseling discussion   1. MSSA tricuspid valve endocarditis/ acute tricuspid valve moderate to severe regurgitation/ with acute diastolic heart failure  decompensation. Will order a PICC line for antibiotic therapy. Tolerating well antibiotic therapy with Cafazolin, with a plan end date May 19. Echocardiography with moderate to severe tricuspid valve regurgitation. Clinically today with no signs of acute heart failure. She is not candidate for surgical intervention. Positive septic emboli to bilateral lungs. Improved volume status, holding further diuresis for now.   2. Acute respiratory failure/ with trach site bleeding. Patient very weak and deconditioned, she has failed spontaneous breathing trial during this hospitalization. Underwent tracheostomy yesterday, with positive post operative bleeding, that required PRBC transfusion. Elevated INR at 1,7 will add 10 units of vitamin K IV and will check INR in am. Follow on DIC panel. Will add fentanyl and hydromorphone for pain control. Scheduled acetaminophen.   3. AKI on CKD stage 4 with hypernatremia and hypokalemia. Renal function close to baseline, down to 2,45 with K at 3,2 and serum Na at 152. Will increase free water to 350 ml q 6 H and will order Kcl for K correction. 57 Meq today. Follow on renal panel in am, avoid hypotension and nephrotoxic medications.   4. Substance abuse with metabolic encephalopathy. Will continue tube feedings and neuro checks per unit protocol, aspiration precautions.   5. Pancytopenia. WBC down to 1,9, Hb 6,2 and Hct 20.8 with plt 90. Continue PRBC transfusion and follow cell count this pm.   6. Severe calorie protein  malnutrition. Continue nutritional supplements.   DVT prophylaxis: scd   Code Status: Partial  Family Communication: no  family at the bedside  Disposition Plan/ discharge barriers: patient critically ill.   Body mass index is 22.29 kg/m. Malnutrition Type:  Nutrition Problem: Inadequate oral intake Etiology: inability to eat(pt sedated and ventilated )   Malnutrition Characteristics:  Signs/Symptoms: NPO status   Nutrition Interventions:     RN Pressure Injury Documentation:     Consultants:   Pulmonary   Procedures:     Antimicrobials:   Cefazolin     Subjective: Patient has been bleeding from tracheostomy, had FFP and PRBC, this am with persistent pain and bleeding. Only lasted about 1 h on trach collar.   Objective: Vitals:   02/03/19 0630 02/03/19 0700 02/03/19 0740 02/03/19 0807  BP: 109/81 109/77 120/83 117/86  Pulse: 96 95 95 (!) 104  Resp: _0 (!) 26  Temp: 99.3 F (37.4 C) 99.5 F (37.5 C) 99.5 F (37.5 C) 99.7 F (37.6 C)  TempSrc:   Bladder   SpO2: 100% 100% 99% 93%  Weight:      Height:        Intake/Output Summary (Last 24 hours) at 02/03/2019 0843 Last data filed at 02/03/2019 0756 Gross per 24 hour  Intake 1493.49 ml  Output 2625 ml  Net -1131.51 ml   Filed Weights   02/01/19 0500 02/02/19 0500 02/03/19 0500  Weight: 54.8 kg 53 kg 53.5 kg    Examination:   General: deconditioned, ill looking appearing and in pain.  Neurology: somnolent, opens eyes to touch and pain. Not following commands.  E ENT: positive pallor, no icterus, oral mucosa moist/ trach in place with positive bleeding. NG tube in place.  Cardiovascular: No JVD. S1-S2 present, rhythmic, no gallops, rubs, or murmurs. Trace lower extremity edema. +++ genital edema,  Pulmonary: positive breath sounds bilaterally, adequate air movement, no wheezing, rhonchi or rales. Gastrointestinal. Abdomen with no organomegaly, non tender, no rebound or guarding Skin. No rashes Musculoskeletal: no joint deformities     Data Reviewed: I have personally reviewed following labs and imaging  studies  CBC: Recent Labs  Lab 01/30/19 0238 01/30/19 1107 01/31/19 0411 02/01/19 0508 02/02/19 0453 02/03/19 0232  WBC 3.3*  --  3.5* 3.9* 2.9* 1.9*  NEUTROABS 2.7  --  2.9  --   --   --   HGB 6.8* 8.0* 7.7* 8.3* 7.9* 6.2*  HCT 22.0* 26.0* 24.3* 26.2* 27.0* 20.8*  MCV 88.4  --  88.0 88.5 92.2 94.1  PLT 71*  --  78* 100* 95* 90*   Basic Metabolic Panel: Recent Labs  Lab 01/28/19 0340 01/29/19 0234 01/30/19 0238 01/31/19 0411 01/31/19 1300 02/01/19 0508 02/02/19 0453 02/03/19 0232  NA 144 144 143  --  145 147* 149* 152*  K 4.5 4.7 4.3  --  3.0* 3.5 3.8 3.2*  CL 105 105 105  --  103 104 108 109  CO2 _1 --  _2 GLUCOSE 137* 138* 137*  --  131* 106* 150* 120*  BUN 159* 162* 157*  --  155* 146* 135* 119*  CREATININE 4.18* 4.03* 3.60*  --  3.51* 3.09* 2.86* 2.45*  CALCIUM 6.9* 6.6* 6.6*  --  6.8* 7.3* 7.4* 7.8*  MG 2.2 2.1 2.1 2.2  --   --   --   --   PHOS 8.8* 9.1* 8.8* 7.7*  --   --   --   --  GFR: Estimated Creatinine Clearance: 25.1 mL/min (A) (by C-G formula based on SCr of 2.45 mg/dL (H)). Liver Function Tests: Recent Labs  Lab 01/28/19 0340 02/01/19 0508 02/02/19 0453 02/03/19 0232  AST _0 ALT <5 <5 <5 5  ALKPHOS 120 117 100 87  BILITOT 0.6 0.5 0.3 0.5  PROT 6.5 7.7 7.3 7.2  ALBUMIN <1.0* 1.1* 1.1* 1.4*   No results for input(s): LIPASE, AMYLASE in the last 168 hours. No results for input(s): AMMONIA in the last 168 hours. Coagulation Profile: Recent Labs  Lab 02/02/19 0950  INR 1.7*   Cardiac Enzymes: No results for input(s): CKTOTAL, CKMB, CKMBINDEX, TROPONINI in the last 168 hours. BNP (last 3 results) No results for input(s): PROBNP in the last 8760 hours. HbA1C: No results for input(s): HGBA1C in the last 72 hours. CBG: Recent Labs  Lab 02/02/19 1530 02/02/19 2023 02/03/19 0017 02/03/19 0432 02/03/19 0731  GLUCAP 90 114* 101* 106* 107*   Lipid Profile: No results for input(s): CHOL, HDL, LDLCALC, TRIG,  CHOLHDL, LDLDIRECT in the last 72 hours. Thyroid Function Tests: No results for input(s): TSH, T4TOTAL, FREET4, T3FREE, THYROIDAB in the last 72 hours. Anemia Panel: No results for input(s): VITAMINB12, FOLATE, FERRITIN, TIBC, IRON, RETICCTPCT in the last 72 hours.    Radiology Studies: I have reviewed all of the imaging during this hospital visit personally     Scheduled Meds: . sodium chloride   Intravenous Once  . sodium chloride   Intravenous Once  . chlorhexidine gluconate (MEDLINE KIT)  15 mL Mouth Rinse BID  . Chlorhexidine Gluconate Cloth  6 each Topical Q0600  . feeding supplement (PRO-STAT SUGAR FREE 64)  30 mL Oral Daily  . free water  250 mL Per Tube Q8H  . mouth rinse  15 mL Mouth Rinse 10 times per day  . methadone  10 mg Per Tube Daily  . pantoprazole sodium  40 mg Per Tube Daily  . Thrombi-Pad  2 each Topical Once  . vitamin C  250 mg Per NG tube BID   Continuous Infusions: . sodium chloride Stopped (02/01/19 2227)  .  ceFAZolin (ANCEF) IV Stopped (02/02/19 2217)  . dexmedetomidine (PRECEDEX) IV infusion Stopped (01/30/19 0801)  . feeding supplement (OSMOLITE 1.5 CAL) 40 mL/hr at 02/02/19 1708  . propofol (DIPRIVAN) infusion 10 mcg/kg/min (02/03/19 0600)  . propofol       LOS: 14 days        Brennon Otterness Gerome Apley, MD

## 2019-02-03 NOTE — Progress Notes (Addendum)
eLink Physician-Brief Progress Note Patient Name: Molly [name] DOB: 12/24/1985 MRN: [id number]   Date of Service  02/03/2019  HPI/Events of Note  Trach placed today with slow oozing from site.  Has thrombipad in place.  Hgb down to 6.2 from 7.9  eICU Interventions  Transfuse 1 unit pRBC Post-transfusion CBC 1 unit of FFP as bleeding has picked up.     Intervention Category Intermediate Interventions: Bleeding - evaluation and treatment with blood products  Molly [name] 02/03/2019, 4:05 AM

## 2019-02-03 NOTE — Progress Notes (Signed)
Trying to get patients blood drawn since 1400 - As soon as blood was drawn it coagulated in tube - DR Patsey Berthold and DR Arrien aware. 3 different lab techs tried to get labs - Last lab tech was able to draw labs

## 2019-02-03 NOTE — Progress Notes (Signed)
Tracheostomy site bleeding. Patient was sedated initially with etomidate, positioned for maximal exposure, old clotted blood removed so that site could be better evaluated.  She did have an oozing area at about 2:00 in relation to her tracheostomy stoma during tracheostomy insertion, there was no clear active bleeding however site did continue to ooz some just generally around stoma.  Care was taken to placed thrombipad around entire stoma with successful hemostasis achieved.  This was done at 11:45 AM.  At approximately 3:00 PM there have been no further bleeding.  The initial plan had been to keep thrombin pad in place for 24 to 48 hours however patient has had rapid clotting with inability to even draw blood over the course of the early afternoon late morning.  Because of this there was concern that possibly a reaction to the thrombin pad could be resulting in patient becoming hypercoagulable as the tracheostomy stoma site was stable we opted to remove the thrombipads. There was no bleeding at time of removal  Plan Cont heavy sedation over night Watch for bleeding  Erick Colace ACNP-BC [place] Pager # [phone] OR # [phone] if no answer

## 2019-02-03 NOTE — Progress Notes (Signed)
[name] that was placed yesterday at 1400 is still significantly bleeding. Saturating 3 separate thrombi pads during our shift, trach ties saturated but left in place as to not interrupt in possible clotting that may be occurring, split gauze and 4x4 gauze saturated every 2 hours throughout shift, bed sheet changed twice due to saturation behind patient's neck and back. Have infused 1 unit of platelets and 1 unit of FFP. Another unit of FFP is currently infusing. Still awaiting 1 unit of pRBC. New type and screen collected. Will continue to monitor.

## 2019-02-03 NOTE — Progress Notes (Signed)
NAME:  Molly [name], MRN:  [id number], DOB:  April 08, 1986, LOS: 67 ADMISSION DATE:  01/20/2019, CONSULTATION DATE: 01/20/2019 REFERRING MD:  Bonner Puna, CHIEF COMPLAINT:  Sepsis  Brief History   33 y/o F with a history of IV drug abuse/polysubstance abuse (presented with urine drug screen positive for opiates and amphetamines) and a previous history in June or July 2019 of inadequately treated MRSA tricuspid valve endocarditis having signed out AMA at that time.    Admitted 4/4, altered, hypotensive and in respiratory distress requiring intubation at [facility] long.  Also LE edema and petechiae on admission.   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 Awake / FC. Looks best in many days. Failed SBT 4/12 Failed SBT.  AKI improving, tx PRBC.  Less alert on precedex, methadone  4/13 More alert, follows commands. On precedex. Failed SBT. Diuresing +9L since admit . ID recommending PICC for antibiotcs > mother not sure about goals of care. S/p 1 unit PRBC   4/17: bedside trach.  Loss of airway, reintubated, then trach placed  Trach oozing blood overnight requiring 1 u PRBC replacement   Consults:  Nephrology  Procedures:  ETT 4/4 >>  R Femoral line 4/4 >> 4/8  Significant Diagnostic Tests:  Echocardiogram 4/4 >> TV vegetation measures 1.5 mm x 0.6 mm., severe TR CT scan of the chest4/4 Bilateral pulmonary nodules,  many of which are peripheral and cavitated.   Micro Data:  Blood cultures x2 4/4 >> MSSA  BCx2 4/8 >> negative    Antimicrobials:  Vancomycin Zosyn Teflaro >> stopped Ancef 4/4 >>  Interim history/subjective:  Afebrile, not weaning .  Awake and alert this am, f/c well .  For Trach today .  Received lasix 40 today at 920   Objective   Blood pressure 112/79, pulse 100, temperature 99 F (37.2 C), resp. rate 16, height 5\' 1"  (1.549 m), weight 53.5 kg, SpO2 97 %.    Vent Mode: CPAP;PSV FiO2 (%):  [30 %-40 %] 40 % Set Rate:  [15 bmp] 15 bmp Vt Set:  [450 mL] 450 mL PEEP:  [5 cmH20] 5 cmH20 Pressure Support:  [12 cmH20] 12 cmH20 Plateau Pressure:  [16 cmH20-17 cmH20] 16 cmH20   Intake/Output Summary (Last 24 hours) at 02/03/2019 1053 Last data filed at 02/03/2019 1000 Gross per 24 hour  Intake 1193.49 ml  Output 2600 ml  Net -1406.51 ml   Filed Weights   02/01/19 0500 02/02/19 0500 02/03/19 0500  Weight: 54.8 kg 53 kg 53.5 kg    General: thin, nad HEENT: MM pink/moist, ETT, dry facial skin Trach ties saturated with blood, saturating through trach dressing every 2 hrs.  Neuro: alert awake, CV: s1s2 rrr, no m/r/g PULM: even/non-labored, lungs bilaterally clear GE:ZMOQ, non-tender NBS Extremities: warm/dry no edema   LABS    PULMONARY No results for input(s): PHART, PCO2ART, PO2ART, HCO3, TCO2, O2SAT in the last 168 hours.  Invalid input(s): PCO2, PO2  CBC Recent Labs  Lab 02/01/19 0508 02/02/19 0453 02/03/19 0232  HGB 8.3* 7.9* 6.2*  HCT 26.2* 27.0* 20.8*  WBC 3.9* 2.9* 1.9*  PLT 100* 95* 90*    COAGULATION Recent Labs  Lab 02/02/19 0950  INR 1.7*    CARDIAC  No results for input(s): TROPONINI in the last 168 hours. No results for input(s): PROBNP in the last 168 hours.   CHEMISTRY Recent Labs  Lab 01/28/19 0340 01/29/19 0234 01/30/19 0238 01/31/19 0411 01/31/19 1300 02/01/19 0508 02/02/19 0453 02/03/19 0232  NA 144 144 143  --  145  147* 149* 152*  K 4.5 4.7 4.3  --  3.0* 3.5 3.8 3.2*  CL 105 105 105  --  103 104 108 109  CO2 28 28 26   --  30 29 29 31   GLUCOSE 137* 138* 137*  --  131* 106* 150* 120*  BUN 159* 162* 157*  --  155* 146* 135* 119*  CREATININE 4.18* 4.03* 3.60*  --  3.51* 3.09* 2.86* 2.45*  CALCIUM 6.9* 6.6* 6.6*  --  6.8* 7.3* 7.4* 7.8*  MG 2.2 2.1 2.1 2.2  --   --   --   --   PHOS 8.8* 9.1* 8.8* 7.7*  --   --   --   --    Estimated Creatinine Clearance: 25.1 mL/min (A) (by C-G formula based on SCr of 2.45 mg/dL (H)).   LIVER Recent Labs  Lab 01/28/19 0340 02/01/19 0508 02/02/19 0453 02/02/19 0950 02/03/19 0232  AST 23 19 22   --  20  ALT <5 <5 <5  --  5  ALKPHOS 120 117 100  --  87  BILITOT 0.6 0.5 0.3  --  0.5  PROT 6.5 7.7 7.3  --  7.2  ALBUMIN <1.0* 1.1* 1.1*  --  1.4*  INR  --   --   --  1.7*  --      INFECTIOUS Recent Labs  Lab 01/28/19 0340  LATICACIDVEN 1.3     ENDOCRINE CBG (last 3)  Recent Labs    02/03/19 0017 02/03/19 0432 02/03/19 0731  GLUCAP 101* 106* 107*    IMAGING   Dg Chest Port 1 View  Result Date: 02/02/2019 CLINICAL DATA:  Status post tracheostomy tube placement EXAM: PORTABLE CHEST 1 VIEW COMPARISON:  02/01/2019 FINDINGS: Endotracheal tube in satisfactory position. Bilateral mild interstitial and alveolar airspace opacities. 12 mm left upper lobe pulmonary nodule with a smaller adjacent pulmonary nodule. Bilateral small pleural effusions. No pneumothorax. Stable cardiomediastinal silhouette. No aggressive osseous lesion. IMPRESSION: 1. Endotracheal tube in satisfactory position. 2. Bilateral interstitial and alveolar airspace opacities most concerning for multilobar pneumonia. Electronically Signed   By: Kathreen Devoid   On: 02/02/2019 14:55    Resolved Hospital Problem list    Septic shock 2/2 MSSA bacteremia - Resolved Acute hypoxemic respiratory failure - now on minimal vent settings.   Intermittent agitation resolved.   A/P MSSA Bacteremia,  infective endocarditis  -abx started 4/8. Now hemodynamically stable.  Severe Tricuspid Regurgitation  P:  Continue Cefazolin Plan to continue abx for 6 weeks Consider PICC, ID requesting for abx.    Respiratory Failure, persistent failure to wean from vent:  On minimal vent settings but becomes profoundly apneic despite being awake on SBT.  Failed SBT due to apnea.  No sedating/respiratory depressing meds. Only methadone, fairly low dose, weaning down.   Trach placed 4/17.  Today tolerating PSV 18/5.  Grossly weak and debilitated after 2 weeks on vent.  Needs  PT, nutrition. Did not tolerate trach collar (tachypnea).   Discussed bleeding with Salvadore Dom, he will come to assess. Cont thrombipads, cont dressing, transfuse blood products as needed.  Cont some sedation for now given bleeding.  Will need PEG consult.    AKI -"not an HD candidate" Improving BUN and cr.  Follow UOP closely.    Free water 250-q8  Trend BMP / urinary output Replace electrolytes as indicated Avoid nephrotoxic agents, ensure adequate renal perfusion  At Risk Malnutrition  P: Continue TF per Nutrition Hypoalbuminemia 1.1  Started on vitamin C yesterday  Hypernatremia Contraction alkalosis I/o +500 past 24, today -900 Holding further lasix.   Increase free water in NG tube  250 q 6  Anemia normocytic -of critical illness.  Acutely worse today due to bleeding from trach site.   P: Trend CBC - recheck after PRBC transfusion complete Transfuse for Hb <7.    Thrombocytopenia -plt stable 100>95k  INR 1.7 yesterday. Initially thought to have DIC, could be a component of sepsis related thrombocytopenia.  Transfuse plt, ffp as needed.   It looks like she did not receive the FFP or the platelets I ordered last night.   Some pink tinge to urine currently.    Leukopenia down to 1.9 today.  Consider heme consult?   Opiate use:  Methadone was stopped    Fentanyl patch was started today    Best practice:  Diet: TF Pain/Anxiety/Delirium protocol (if indicated): prn VAP protocol (if indicated): in place DVT prophylaxis: scd,  Start heparin once bleeding has resolved (had been held due to thrombocytopenia) GI prophylaxis: PPI, consider stopping once off vent Glucose control: protocol  Mobility: as tolerated Code Status: Limited code - no Compressions per review of notes Family Communication: - prior discussion 4/6- no cvvh, no cpr, no mri - continue full medical care otherwise. Updated 4/8. On 4/11 - LMTCB. On 2019-01-30 - mom's cell appears dead. RN also reports difficulty getting hold of mom BOTH ACCEPTING trach and peg 4/15 meeting (per palliative notes) Disposition: ICU   CC Time:    45 min

## 2019-02-03 NOTE — Progress Notes (Signed)
0830 & 1030 changed thrombi pads at trach site layered gently with 4x4's and precut 4x4 - MD aware of bleeding and amounts- noted sm clots at site - Pt has 2 FFP and 1 PRBC unit of blood this am - blood still infusing

## 2019-02-04 ENCOUNTER — Inpatient Hospital Stay (HOSPITAL_COMMUNITY): Payer: Medicaid Other

## 2019-02-04 LAB — COMPREHENSIVE METABOLIC PANEL
ALT: <5 U/L (ref 0–44)
AST: 17 U/L (ref 15–41)
Albumin: 1.3 g/dL — ABNORMAL LOW (ref 3.5–5.0)
Alkaline Phosphatase: 85 U/L (ref 38–126)
Anion gap: 11 (ref 5–15)
BUN: 113 mg/dL — ABNORMAL HIGH (ref 6–20)
CO2: 31 mmol/L (ref 22–32)
Calcium: 7.8 mg/dL — ABNORMAL LOW (ref 8.9–10.3)
Chloride: 108 mmol/L (ref 98–111)
Creatinine, Ser: 2.37 mg/dL — ABNORMAL HIGH (ref 0.44–1.00)
GFR calc Af Amer: 31 mL/min — ABNORMAL LOW (ref >60)
GFR calc non Af Amer: 26 mL/min — ABNORMAL LOW (ref >60)
Glucose, Bld: 117 mg/dL — ABNORMAL HIGH (ref 70–99)
Potassium: 3.6 mmol/L (ref 3.5–5.1)
Sodium: 150 mmol/L — ABNORMAL HIGH (ref 135–145)
Total Bilirubin: 0.7 mg/dL (ref 0.3–1.2)
Total Protein: 7.2 g/dL (ref 6.5–8.1)

## 2019-02-04 LAB — GLUCOSE, CAPILLARY
Glucose-Capillary: 102 mg/dL — ABNORMAL HIGH (ref 70–99)
Glucose-Capillary: 110 mg/dL — ABNORMAL HIGH (ref 70–99)
Glucose-Capillary: 90 mg/dL (ref 70–99)
Glucose-Capillary: 94 mg/dL (ref 70–99)
Glucose-Capillary: 96 mg/dL (ref 70–99)
Glucose-Capillary: 97 mg/dL (ref 70–99)

## 2019-02-04 LAB — PREPARE PLATELET PHERESIS: Unit division: 0

## 2019-02-04 LAB — TYPE AND SCREEN
ABO/RH(D): O NEG
Antibody Screen: NEGATIVE
Unit division: 0

## 2019-02-04 LAB — CBC
HCT: 26.1 % — ABNORMAL LOW (ref 36.0–46.0)
Hemoglobin: 7.5 g/dL — ABNORMAL LOW (ref 12.0–15.0)
MCH: 26.7 pg (ref 26.0–34.0)
MCHC: 28.7 g/dL — ABNORMAL LOW (ref 30.0–36.0)
MCV: 92.9 fL (ref 80.0–100.0)
Platelets: 88 10*3/uL — ABNORMAL LOW (ref 150–400)
RBC: 2.81 MIL/uL — ABNORMAL LOW (ref 3.87–5.11)
RDW: 16.9 % — ABNORMAL HIGH (ref 11.5–15.5)
WBC: 2.3 10*3/uL — ABNORMAL LOW (ref 4.0–10.5)
nRBC: 0 % (ref 0.0–0.2)

## 2019-02-04 LAB — BPAM RBC
Blood Product Expiration Date: 202004272359
ISSUE DATE / TIME: 202004180746
Unit Type and Rh: 9500

## 2019-02-04 LAB — PREPARE FRESH FROZEN PLASMA: Unit division: 0

## 2019-02-04 LAB — BPAM FFP
Blood Product Expiration Date: 202004222359
ISSUE DATE / TIME: 202004180547
Unit Type and Rh: 600

## 2019-02-04 LAB — BPAM PLATELET PHERESIS
Blood Product Expiration Date: 202004182359
Unit Type and Rh: 6200

## 2019-02-04 LAB — TRIGLYCERIDES: Triglycerides: 56 mg/dL (ref <150)

## 2019-02-04 MED ORDER — FREE WATER: 350.0000 mL | Status: DC

## 2019-02-04 MED ORDER — FREE WATER
350.0000 mL | Freq: Three times a day (TID) | Status: DC
  Administered 2019-02-05 – 2019-02-09 (×12): 350 mL

## 2019-02-04 MED ORDER — FUROSEMIDE 10 MG/ML IJ SOLN
40.0000 mg | Freq: Once | INTRAMUSCULAR | Status: AC
  Administered 2019-02-04: 40 mg via INTRAVENOUS
  Filled 2019-02-04: qty 4

## 2019-02-04 NOTE — Progress Notes (Addendum)
NAME:  Molly [name], MRN:  [id number], DOB:  1986-10-12, LOS: 61 ADMISSION DATE:  01/20/2019, CONSULTATION DATE: 01/20/2019 REFERRING MD:  Bonner Puna, CHIEF COMPLAINT:  Sepsis  Brief History   33 y/o F with a history of IV drug abuse/polysubstance abuse (presented with urine drug screen positive for opiates and amphetamines) and a previous history in June or July 2019 of inadequately treated MRSA tricuspid valve endocarditis having signed out AMA at that time.    Admitted 4/4, altered, hypotensive and in respiratory distress requiring intubation at [facility] long.  Also LE edema and petechiae on admission.   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 Awake / FC. Looks best in many days. Failed SBT 4/12 Failed SBT.  AKI improving, tx PRBC.  Less alert on precedex, methadone  4/13 More alert, follows commands. On precedex. Failed SBT. Diuresing +9L since admit . ID recommending PICC for antibiotcs > mother not sure about goals of care. S/p 1 unit PRBC   4/17: bedside trach.  Loss of airway, reintubated, then trach placed  Trach oozing blood overnight requiring 1 u PRBC replacement. Thrombi pads placed. Bleeding stopped. There was frequent clotting noted when trying to get lab draws. Raising concern for possible hypercoagulable state (per literature review) as a consequence of thrombi pads. As we had achieved hemostatsis  w/ the trach we removed the thrombi pads after 3 hrs and kept sedated over night.  4/18: no acute issues over night. Trach site stable. Am lab work stable. PICC team unable to place PICC; IR consulted. Decreasing sedation w/ hope to start/resume weaning.    Consults:  Nephrology  Procedures:  ETT 4/4 >>  R Femoral line 4/4 >> 4/8  Significant Diagnostic Tests:  Echocardiogram 4/4 >> TV vegetation measures 1.5 mm x 0.6 mm., severe TR CT scan of the chest4/4 Bilateral pulmonary nodules, many of which are peripheral and cavitated.   Micro Data:  Blood cultures x2 4/4 >> MSSA  BCx2 4/8 >> negative    Antimicrobials:  Vancomycin Zosyn Teflaro >> stopped Ancef 4/4 >>  Interim history/subjective:  Appears comfortable. No issues over night.   Objective   Blood pressure 114/87, pulse 83, temperature 99 F (37.2 C), resp. rate 16, height 5\' 1"  (1.549 m), weight 55.5 kg, SpO2 98 %.    Vent Mode: PRVC FiO2 (%):  [40 %-100 %] 40 % Set Rate:  [15 bmp] 15 bmp Vt Set:  [450 mL] 450 mL PEEP:  [5 cmH20] 5 cmH20 Plateau Pressure:  [17 cmH20-19 cmH20] 18 cmH20   Intake/Output Summary (Last 24 hours) at 02/04/2019 0903 Last data filed at 02/04/2019 0558 Gross per 24 hour  Intake 2312.69 ml  Output 1245 ml  Net 1067.69 ml   Filed Weights   02/02/19 0500 02/03/19 0500 02/04/19 0500  Weight: 53 kg 53.5 kg 55.5 kg   Physical exam    General chronically ill appearing 33 year old female. Appears much older than stated age. Remains  sedated on vent HENT: NCAT. Temporal wasting noted. No trach site bleeding today. Sclera non-icteric.  Pulm: scattered rhonchi. Equal chest rise. No accessory use on vent Card RRR loud systolic murmur over tricuspid area abd soft not tender. tol tubefeeds Ext warm, strong pulses. Embolic ulcerative changes on toes appreciated.  Neuro sedated no focal def  GU cl yellow   Resolved Hospital Problem list    Septic shock 2/2 MSSA bacteremia - Resolved Acute  hypoxemic respiratory failure - now on minimal vent settings.   Intermittent agitation resolved.   Current problem list    MSSA Bacteremia, w/ tricuspid  Valve endocarditis  Severe Tricuspid Regurgitation  -abx started 4/8. -hemodynamics stable  Plan Complete 6 weeks cefazolin F/u ID  Acute respiratory Failure w/ failure to wean and now tracheostomy dependence  -Trach placed 4/17.  -PCXR from 4/17: patchy bilateral airspace disease. These actually looked a little worse when comparing prior films Plan Full vent support  Decrease sedation goal to RASS of 0 Dc prop if able Lasix X1 Repeat cxr am Watch fever curve  Trach site bleeding Plan Watch for bleeding  AKI w/ CKD stage IV -"not an HD candidate" -scr slowly improving Plan Trend cmp  Fluid and Electrolyte imbalance: hypernatremia Plan Free water replacement   Severe protein calorie malnutrition  Plan Cont tube feeds  Pancytopenia:Has anemia of critical illness. C/b blood loss anemia from trach site bleeding, on-going thrombocytopenia and leukopenia: suspect all 2/2 sepsis at least initially  -got blood 4/18; hgb stable over night 4/19; PLTs holding Plan Cont to trend cbc Transfuse for hgb < 7 Hold  heparin another 24 hrs given recent trach bleeding   Coagulopathy  Plan Trend INR    Chronic pain w/ opioid dependence + likely component of anxiety Plan Best practice:  Diet: TF Pain/Anxiety/Delirium protocol (if indicated): prn VAP protocol (if indicated): in place DVT prophylaxis: scd,  Start heparin once bleeding has resolved (had been held due to thrombocytopenia) GI prophylaxis: PPI, consider stopping once off vent Glucose control: protocol  Mobility: as tolerated Code Status: Limited code - no Compressions per review of notes Family Communication: - prior discussion 4/6- no cvvh, no cpr, no mri - continue full medical care otherwise. Updated 4/8. On 4/11 - LMTCB. On 02-22-2019 - mom's cell appears  dead. RN also reports difficulty getting hold of mom BOTH ACCEPTING trach and peg 4/15 meeting (per palliative notes) Disposition: cont supportive care Weaning sedation     Erick Colace ACNP-BC [place] Pager # [phone] OR # [phone] if no answer   Attending MD note  Patient was independently seen and examined, treatment plan was discussed with the  Advance Practice Provider. I agree with the above note by Salvadore Dom I have personally reviewed the clinical findings, labs, ECG, imaging studies and management of this patient in detail. I have also reviewed the orders written for this patient which were under my direction. I agree with the documentation, as recorded by the Advance Practice Provider.   Briefly, Molly [name] is a 33 y.o. female With MSSA endocarditis, failed to wean from vent due to apnea.   Subjective: Today tolerated PSV for time.  Still havign apnea episodes.  Was having bleeding from trach yesterday so kept on sedation.  PICC attempted today but they were concerned for possible access of an artery so stopped procedure.   Objective: Vitals:   02/04/19 1630 02/04/19 1700  BP: (!) 122/92 (!) 116/95  Pulse: 78 76  Resp:  15 15  Temp: 98.4 F (36.9 C) 98.2 F (36.8 C)  SpO2: 93% 91%    FiO2 (%):  [30 %] 30 % No intake or output data in the 24 hours ending 08/16/18 235  General:  nad  Neuro:  Drowsy but arousable HEENT:  /AT, No JVD noted, PERRL Cardiovascular:  RRR, no MRG Lungs:  ctab Abdomen:  Soft, non-distended Musculoskeletal:  No acute deformity More edema today.  Skin:  Intact, MMM   Impression/Plan: As detailed above.  Trach no longer bloody.  Unclear why blood was clotting on draw yesterday afternoon.  No signs of dic today. Still thrombocytopenic.  Still requires vent due to apneic episodes. Cont daily PSV. Avoid opiates.  Some abdominal pain today, possibly due to constipation vs increasing sq edema.   Ordering single dose lasix. decreas fw flush to q8.  AKI cont to gradually improve.    This patient is critically ill, requiring high complexity decision making for assessment and plan, frequent evaluation, application of advanced monitoring and extensive interpretations of multiple databases.    Critical Care time devoted to patient care services described in this note is 35 minutes, not including time spent on procedures, teaching or supervising.    Collier Bullock, MD

## 2019-02-04 NOTE — Progress Notes (Signed)
PROGRESS NOTE [facility]   Molly [name]   IHK:[id number] DOB: 24-Aug-1986  DOA: 01/20/2019 PCP: Patient, No Pcp Per   Brief Narrative:  Molly [name] 33 year old female who presented to the emergency department with altered mental status.  Upon ED evaluation she was found to be hypotensive, tachycardic with lower extremity petechia sign ecchymosis.  Chest x-ray showed bilateral multiple infiltrates.  Lab work-up showed creatinine of 7.2 with elevated liver enzymes and platelets of 21.  Patient was admitted to the hospital with working diagnosis of sepsis with multiorgan failure and possible DIC.  Patient condition deteriorated and required invasive mechanical ventilation.  CT of the chest was done which was positive for septic emboli and right base pneumonia.  Blood cultures grew MSSA.  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  Patient has history of polysubstance/IV drug abuse.  Patient has been on antibiotic therapy with cefazolin.  Underwent tracheostomy on April 17, which was complicated by bleeding.  Subjective: Patient seen and examined, she is somnolent but easily arousable.  [name] has stopped bleeding.  She complains abdominal pain.  Feeding tubes were placed on hold.  Renal function improving.  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 Patient been treated with IV antibiotic, cefazolin and date May 19.  Echocardiogram shows moderate to severe tricuspid valve regurgitation.  Patient seems to be euvolemic.  Patient is not candidate for surgical intervention at this time.  Positive septic emboli to bilateral lungs.  Continue to monitor   Acute respiratory failure, status post tracheostomy Patient remained on full vent support.  Failed spontaneous breathing trial.  Patient with postop bleeding that require PRBC transfusion.  Found to have elevated INR, treated  with 10 units of vitamin K.  DIC panel was ordered for fibrinogen within normal limits.  D-dimer elevated likely due to infectious process and septic emboli to the lung.  Will obtain her extremity Doppler rule out DVT.  AKI on CKD stage IV with hypernatremia and hypokalemia. Renal function almost at baseline.  Sodium improving, continue free water.  Electrolyte correction as needed.  Monitor renal function in a.m.  Avoid hypotension and nephrotoxic agent.  Pancytopenia Likely bone marrow suppression from sepsis, transfuse if hemoglobin less than 7.  Continue to monitor platelets.  No signs of overt bleeding at this point.  Severe calorie protein malnutrition Continue nutritional supplement per dietitian.  Polysubstance abuse  DVT prophylaxis: SCDs Code Status: Partial Family Communication: None at bedside Disposition Plan: Pending clinical improvement, keep in ICU.  Consultants:   PCCM  Procedures:   Trach 4/17  Antimicrobials:  Cefazolin   Objective: Vitals:   02/04/19 1030 02/04/19 1100 02/04/19 1130 02/04/19 1200  BP: 111/84  (!) 153/95   Pulse: 83 99 (!) 107 95  Resp: _0 Temp: 99.1 F (37.3 C) 99.1 F (37.3 C) 99.1 F (37.3 C) 99 F (37.2 C)  TempSrc:      SpO2: 98% 97% 98% 98%  Weight:      Height:        Intake/Output Summary (Last 24 hours) at 02/04/2019 1207 Last data filed at 02/04/2019 1200 Gross per 24 hour  Intake 2277.77 ml  Output 1040 ml  Net 1237.77 ml   Filed Weights   02/02/19 0500 02/03/19 0500 02/04/19 0500  Weight: 53 kg 53.5 kg 55.5 kg    Examination:  General exam: Ill appearing HEENT: Nonicteric, Molly [name], trach in place no bleeding noted Respiratory  system: Good air entry, no wheezing, rales or rhonchi's.  Vent dependent. Cardiovascular system: S1 & S2 heard, RRR. No JVD, murmurs, rubs or gallops Gastrointestinal system: Abdomen is nondistended, soft and nontender. No organomegaly or masses felt. Normal bowel sounds  heard. Central nervous system: Somnolent however responds to stimuli, following simple commands. Extremities: Bilateral lower extremity edema, 1+.   Skin: No rashes Psychiatry: Unable to evaluate   Data Reviewed: I have personally reviewed following labs and imaging studies  CBC: Recent Labs  Lab 01/30/19 0238  01/31/19 0411 02/01/19 0508 02/02/19 0453 02/03/19 0232 02/03/19 1901 02/04/19 0424  WBC 3.3*  --  3.5* 3.9* 2.9* 1.9* 2.0* 2.3*  NEUTROABS 2.7  --  2.9  --   --   --  1.3*  --   HGB 6.8*   < > 7.7* 8.3* 7.9* 6.2* 7.3* 7.5*  HCT 22.0*   < > 24.3* 26.2* 27.0* 20.8* 24.0* 26.1*  MCV 88.4  --  88.0 88.5 92.2 94.1 93.0 92.9  PLT 71*  --  78* 100* 95* 90* 82* 88*   < > = values in this interval not displayed.   Basic Metabolic Panel: Recent Labs  Lab 01/29/19 0234 01/30/19 0238 01/31/19 0411 01/31/19 1300 02/01/19 0508 02/02/19 0453 02/03/19 0232 02/04/19 0424  NA 144 143  --  145 147* 149* 152* 150*  K 4.7 4.3  --  3.0* 3.5 3.8 3.2* 3.6  CL 105 105  --  103 104 108 109 108  CO2 28 26  --  _0 GLUCOSE 138* 137*  --  131* 106* 150* 120* 117*  BUN 162* 157*  --  155* 146* 135* 119* 113*  CREATININE 4.03* 3.60*  --  3.51* 3.09* 2.86* 2.45* 2.37*  CALCIUM 6.6* 6.6*  --  6.8* 7.3* 7.4* 7.8* 7.8*  MG 2.1 2.1 2.2  --   --   --   --   --   PHOS 9.1* 8.8* 7.7*  --   --   --   --   --    GFR: Estimated Creatinine Clearance: 26 mL/min (A) (by C-G formula based on SCr of 2.37 mg/dL (H)). Liver Function Tests: Recent Labs  Lab 02/01/19 0508 02/02/19 0453 02/03/19 0232 02/04/19 0424  AST _1 ALT <5 <5 5 <5  ALKPHOS 117 100 87 85  BILITOT 0.5 0.3 0.5 0.7  PROT 7.7 7.3 7.2 7.2  ALBUMIN 1.1* 1.1* 1.4* 1.3*   No results for input(s): LIPASE, AMYLASE in the last 168 hours. No results for input(s): AMMONIA in the last 168 hours. Coagulation Profile: Recent Labs  Lab 02/02/19 0950 02/03/19 1901  INR 1.7* 1.5*   Cardiac Enzymes: No results  for input(s): CKTOTAL, CKMB, CKMBINDEX, TROPONINI in the last 168 hours. BNP (last 3 results) No results for input(s): PROBNP in the last 8760 hours. HbA1C: No results for input(s): HGBA1C in the last 72 hours. CBG: Recent Labs  Lab 02/03/19 1604 02/03/19 1951 02/03/19 2317 02/04/19 0416 02/04/19 0945  GLUCAP 110* 103* 115* 110* 102*   Lipid Profile: Recent Labs    02/04/19 0424  TRIG 56   Thyroid Function Tests: No results for input(s): TSH, T4TOTAL, FREET4, T3FREE, THYROIDAB in the last 72 hours. Anemia Panel: No results for input(s): VITAMINB12, FOLATE, FERRITIN, TIBC, IRON, RETICCTPCT in the last 72 hours. Sepsis Labs: No results for input(s): PROCALCITON, LATICACIDVEN in the last 168 hours.  No results found for this or any  previous visit (from the past 240 hour(s)).    Radiology Studies: Dg Chest Port 1 View  Result Date: 02/02/2019 CLINICAL DATA:  Status post tracheostomy tube placement EXAM: PORTABLE CHEST 1 VIEW COMPARISON:  02/01/2019 FINDINGS: Endotracheal tube in satisfactory position. Bilateral mild interstitial and alveolar airspace opacities. 12 mm left upper lobe pulmonary nodule with a smaller adjacent pulmonary nodule. Bilateral small pleural effusions. No pneumothorax. Stable cardiomediastinal silhouette. No aggressive osseous lesion. IMPRESSION: 1. Endotracheal tube in satisfactory position. 2. Bilateral interstitial and alveolar airspace opacities most concerning for multilobar pneumonia. Electronically Signed   By: Kathreen Devoid   On: 02/02/2019 14:55   [place] Ekg Site Rite  Result Date: 02/03/2019 If Site Rite image not attached, placement could not be confirmed due to current cardiac rhythm.     Scheduled Meds: . sodium chloride   Intravenous Once  . sodium chloride   Intravenous Once  . sodium chloride   Intravenous Once  . acetaminophen  650 mg Per Tube Q6H  . chlorhexidine gluconate (MEDLINE KIT)  15 mL Mouth Rinse BID  . Chlorhexidine Gluconate  Cloth  6 each Topical Q0600  . feeding supplement (PRO-STAT SUGAR FREE 64)  30 mL Oral Daily  . fentaNYL  1 patch Transdermal Q72H  . free water  350 mL Per Tube Q4H  . furosemide  40 mg Intravenous Once  . mouth rinse  15 mL Mouth Rinse 10 times per day  . pantoprazole sodium  40 mg Per Tube Daily  . Thrombi-Pad  1 each Topical Once  . vitamin C  250 mg Per NG tube BID   Continuous Infusions: . sodium chloride Stopped (02/01/19 2227)  .  ceFAZolin (ANCEF) IV Stopped (02/04/19 1110)  . feeding supplement (OSMOLITE 1.5 CAL) Stopped (02/04/19 1145)  . propofol (DIPRIVAN) infusion 10 mcg/kg/min (02/04/19 1200)  . propofol       LOS: 15 days    Time spent: Total of 35 minutes spent with pt, greater than 50% of which was spent in discussion of  treatment, counseling and coordination of care   Chipper Oman, MD  How to contact the [facility] Attending or Consulting provider [place] or covering provider during after hours [place], for this patient?  1. Check the care team in [facility] and look for a) attending/consulting TRH provider listed and b) the [facility] team listed 2. Log into [email or web address] and use [place]'s universal password to access. If you do not have the password, please contact the hospital operator. 3. Locate the [facility] provider you are looking for under [facility] and page to a number that you can be directly reached. 4. If you still have difficulty reaching the provider, please page the [facility] (Director on Call) for the Hospitalists listed on amion for assistance.

## 2019-02-04 NOTE — Progress Notes (Signed)
Assisted tele visit to patient with mother.  Siedah Sedor, Cindra Presume, RN

## 2019-02-04 NOTE — Progress Notes (Signed)
Attempted PICC placement RUA, brachial region.  Needle tip clearly seen in the vein, good non pulsating bright red blood flow.  Introducer placed, guidewire removed and noted very slight pulsation in blood flow. Introducer removed almost completely with projectile pulsating blood flow.  Introducer completely removed and firm pressure held to site for 15 minutes.  Pressure dressing applied to site with no further bleeding noted, bruising present at site.  Marni Griffon NP at [facility], notified of event.  No new orders.  Also notified of multiple vessels noted with multiple arteries present and difficulty differentiating between artery and vein.  This RN felt quite confident with vessel chosen to be a vein.  All other vessels pulsating or non compressible in BUE.  Referred to IR to place line or CVC if needed.  No other option to attempt to place PICC noted.  Whitney RN also notified.

## 2019-02-04 NOTE — Progress Notes (Signed)
Pt complaining of abd pain, abd taut and distended on assessment. LBM 4/19 (large). Holding tube feeds as of 1145, 4/19, per Duwayne Heck, MD.  Donnetta Simpers, RN

## 2019-02-05 ENCOUNTER — Inpatient Hospital Stay (HOSPITAL_COMMUNITY): Payer: Medicaid Other

## 2019-02-05 ENCOUNTER — Encounter (HOSPITAL_COMMUNITY): Payer: Self-pay | Admitting: Interventional Radiology

## 2019-02-05 DIAGNOSIS — M7989 Other specified soft tissue disorders: Secondary | ICD-10-CM

## 2019-02-05 HISTORY — PX: IR FLUORO GUIDE CV LINE RIGHT: IMG2283

## 2019-02-05 HISTORY — PX: IR US GUIDE VASC ACCESS RIGHT: IMG2390

## 2019-02-05 LAB — COMPREHENSIVE METABOLIC PANEL
ALT: <5 U/L (ref 0–44)
AST: 14 U/L — ABNORMAL LOW (ref 15–41)
Albumin: 1.3 g/dL — ABNORMAL LOW (ref 3.5–5.0)
Alkaline Phosphatase: 76 U/L (ref 38–126)
Anion gap: 11 (ref 5–15)
BUN: 116 mg/dL — ABNORMAL HIGH (ref 6–20)
CO2: 28 mmol/L (ref 22–32)
Calcium: 7.5 mg/dL — ABNORMAL LOW (ref 8.9–10.3)
Chloride: 109 mmol/L (ref 98–111)
Creatinine, Ser: 2.4 mg/dL — ABNORMAL HIGH (ref 0.44–1.00)
GFR calc Af Amer: 30 mL/min — ABNORMAL LOW (ref >60)
GFR calc non Af Amer: 26 mL/min — ABNORMAL LOW (ref >60)
Glucose, Bld: 101 mg/dL — ABNORMAL HIGH (ref 70–99)
Potassium: 3.4 mmol/L — ABNORMAL LOW (ref 3.5–5.1)
Sodium: 148 mmol/L — ABNORMAL HIGH (ref 135–145)
Total Bilirubin: 0.5 mg/dL (ref 0.3–1.2)
Total Protein: 7.3 g/dL (ref 6.5–8.1)

## 2019-02-05 LAB — CBC
HCT: 26.3 % — ABNORMAL LOW (ref 36.0–46.0)
Hemoglobin: 7.8 g/dL — ABNORMAL LOW (ref 12.0–15.0)
MCH: 27.3 pg (ref 26.0–34.0)
MCHC: 29.7 g/dL — ABNORMAL LOW (ref 30.0–36.0)
MCV: 92 fL (ref 80.0–100.0)
Platelets: 75 10*3/uL — ABNORMAL LOW (ref 150–400)
RBC: 2.86 MIL/uL — ABNORMAL LOW (ref 3.87–5.11)
RDW: 15.8 % — ABNORMAL HIGH (ref 11.5–15.5)
WBC: 2.4 10*3/uL — ABNORMAL LOW (ref 4.0–10.5)
nRBC: 0 % (ref 0.0–0.2)

## 2019-02-05 LAB — GLUCOSE, CAPILLARY
Glucose-Capillary: 85 mg/dL (ref 70–99)
Glucose-Capillary: 93 mg/dL (ref 70–99)
Glucose-Capillary: 106 mg/dL — ABNORMAL HIGH (ref 70–99)
Glucose-Capillary: 114 mg/dL — ABNORMAL HIGH (ref 70–99)
Glucose-Capillary: 111 mg/dL — ABNORMAL HIGH (ref 70–99)

## 2019-02-05 LAB — MAGNESIUM: Magnesium: 1.9 mg/dL (ref 1.7–2.4)

## 2019-02-05 MED ORDER — FUROSEMIDE 10 MG/ML IJ SOLN
60.0000 mg | Freq: Every day | INTRAMUSCULAR | Status: AC
  Administered 2019-02-05 – 2019-02-08 (×4): 60 mg via INTRAVENOUS
  Filled 2019-02-05 (×5): qty 6

## 2019-02-05 MED ORDER — LIDOCAINE HCL 1 % IJ SOLN
INTRAMUSCULAR | Status: AC | PRN
  Administered 2019-02-05: 10 mL

## 2019-02-05 MED ORDER — POTASSIUM CHLORIDE 20 MEQ/15ML (10%) PO SOLN
40.0000 meq | Freq: Once | ORAL | Status: AC
  Administered 2019-02-05: 40 meq via ORAL
  Filled 2019-02-05: qty 30

## 2019-02-05 MED ORDER — HEPARIN SODIUM (PORCINE) 5000 UNIT/ML IJ SOLN
5000.0000 [IU] | Freq: Three times a day (TID) | INTRAMUSCULAR | Status: DC
  Administered 2019-02-05 – 2019-02-13 (×24): 5000 [IU] via SUBCUTANEOUS
  Filled 2019-02-05 (×25): qty 1

## 2019-02-05 MED ORDER — LIDOCAINE HCL 1 % IJ SOLN
INTRAMUSCULAR | Status: AC
  Filled 2019-02-05: qty 20

## 2019-02-05 NOTE — Progress Notes (Signed)
Physical Therapy Treatment Patient Details Name: Molly [name] MRN: [id number] DOB: 09-14-1986 Today's Date: 02/05/2019    History of Present Illness Pt adm for acute respiratory failure due to Septic pulmonary embolus due to MRSA tricuspid vegetation. Pt intubated 01/20/19. PMH - polysubstance abuse, inadequately treated MRSA endocarditis having signed out AMA  in July 2019, osteomyelitis of spine    PT Comments    Pt making progress with mobility. Able to get OOB to chair.    Follow Up Recommendations  CIR     Equipment Recommendations  Other (comment)(To be determined)    Recommendations for Other Services       Precautions / Restrictions Precautions Precautions: Fall;Other (comment) Precaution Comments: vent Restrictions Weight Bearing Restrictions: No    Mobility  Bed Mobility Overal bed mobility: Needs Assistance Bed Mobility: Supine to Sit     Supine to sit: Mod assist;+2 for safety/equipment     General bed mobility comments: Assist to bring legs off of bed, elevate trunk into sitting and bring hips to EOB  Transfers Overall transfer level: Needs assistance   Transfers: Sit to/from Stand Sit to Stand: Mod assist;+2 physical assistance         General transfer comment: Assist to bring hips up and for balance. Manual facilitation to extend hips and trunk. Used Stedy for bed to chair.   Ambulation/Gait                 Stairs             Wheelchair Mobility    Modified Rankin (Stroke Patients Only)       Balance Overall balance assessment: Needs assistance Sitting-balance support: No upper extremity supported;Feet supported Sitting balance-Leahy Scale: Fair Sitting balance - Comments: Min Guard A for safety at EOB   Standing balance support: Bilateral upper extremity supported;During functional activity Standing balance-Leahy Scale: Poor Standing balance comment: Stedy and +2 min assist to maintain static standing                            Cognition Arousal/Alertness: Awake/alert Behavior During Therapy: WFL for tasks assessed/performed Overall Cognitive Status: Difficult to assess Area of Impairment: Following commands;Problem solving                       Following Commands: Follows one step commands consistently;Follows one step commands with increased time     Problem Solving: Slow processing General Comments: Incr time required for instructions      Exercises      General Comments General comments (skin integrity, edema, etc.): VSS      Pertinent Vitals/Pain Pain Assessment: Faces Faces Pain Scale: Hurts even more Pain Location: Back Pain Descriptors / Indicators: Constant;Discomfort;Grimacing Pain Intervention(s): Limited activity within patient's tolerance;Monitored during session;Repositioned    Home Living Family/patient expects to be discharged to:: Private residence Living Arrangements: Parent             Additional Comments: Pt unable to give details of set up    Prior Function Level of Independence: Independent          PT Goals (current goals can now be found in the care plan section) Acute Rehab PT Goals Patient Stated Goal: Agreeable to OOB to recliner Progress towards PT goals: Progressing toward goals    Frequency    Min 3X/week      PT Plan Discharge plan needs to be updated    Co-evaluation PT/OT/SLP  Co-Evaluation/Treatment: Yes Reason for Co-Treatment: Complexity of the patient's impairments (multi-system involvement);For patient/therapist safety PT goals addressed during session: Mobility/safety with mobility;Balance OT goals addressed during session: ADL's and self-care      AM-PAC PT "6 Clicks" Mobility   Outcome Measure  Help needed turning from your back to your side while in a flat bed without using bedrails?: A Little Help needed moving from lying on your back to sitting on the side of a flat bed without using bedrails?: A  Lot Help needed moving to and from a bed to a chair (including a wheelchair)?: Total Help needed standing up from a chair using your arms (e.g., wheelchair or bedside chair)?: A Lot Help needed to walk in hospital room?: Total Help needed climbing 3-5 steps with a railing? : Total 6 Click Score: 10    End of Session Equipment Utilized During Treatment: Gait belt;Oxygen(vent) Activity Tolerance: Patient tolerated treatment well Patient left: in chair;with call bell/phone within reach;with chair alarm set Nurse Communication: Mobility status;Need for lift equipment(spoke with nurse tech) PT Visit Diagnosis: Other abnormalities of gait and mobility (R26.89);Muscle weakness (generalized) (M62.81);Unsteadiness on feet (R26.81)     Time: 8841-6606 PT Time Calculation (min) (ACUTE ONLY): 35 min  Charges:  $Therapeutic Activity: 8-22 mins                     [place] Pager [phone] Office [place] 02/05/2019, 2:54 PM

## 2019-02-05 NOTE — Progress Notes (Signed)
Patient ID: Molly [name], female   DOB: Jul 22, 1986, 33 y.o.   MRN: [id number]   MSSA sepsis pulm septic emboli Long term antibiotics recommended Possible home with ongoing treatment IV therapy unable to gain access in arm Creatinine 2.4 today  Pt is scheduled for tunneled Central catheter placement in IR Consented with mother via phone

## 2019-02-05 NOTE — Progress Notes (Signed)
PROGRESS NOTE [facility]   Molly [name]   XVQ:[id number] DOB: 03-03-1986  DOA: 01/20/2019 PCP: Patient, No Pcp Per   Brief Narrative:  Molly [name] 32 year old female who presented to the emergency department with altered mental status.  Upon ED evaluation she was found to be hypotensive, tachycardic with lower extremity petechia sign ecchymosis.  Chest x-ray showed bilateral multiple infiltrates.  Lab work-up showed creatinine of 7.2 with elevated liver enzymes and platelets of 21.  Patient was admitted to the hospital with working diagnosis of sepsis with multiorgan failure and possible DIC.  Patient condition deteriorated and required invasive mechanical ventilation.  CT of the chest was done which was positive for septic emboli and right base pneumonia.  Blood cultures grew MSSA.  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  Patient has history of polysubstance/IV drug abuse.  Patient has been on antibiotic therapy with cefazolin.  Underwent tracheostomy on April 17, which was complicated by bleeding.  Subjective: Patient seen and examined, she is awake and alert.  Following commands.  She denies any pain.  Having bowel movements.  Tube feedings were resumed and tolerating well.  Remains afebrile.  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 Continue IV antibiotic therapy with cefazolin, end date May 19.  Echocardiogram shows moderate to severe tricuspid valve regurgitation.  Patient currently euvolemic.Marland Kitchen  Patient is not candidate for surgical intervention at this time.  Positive septic emboli to bilateral lungs.  Continue to monitor   Acute respiratory failure, status post tracheostomy Patient tolerating PSB, she had postop bleed with tracheostomy that require PRBC transfusion.  No signs of DIC.  D-dimer elevated due to infectious process and septic emboli to the  lungs.  Lower extremity Doppler negative for DVT.  AKI on CKD stage IV with hypernatremia and hypokalemia. Renal function is plateauing, continue free water.  Electrolyte correction as needed.  Continue to monitor renal function.  Avoid hypotension and nephrotoxic agents.   Pancytopenia - stable Likely bone marrow suppression from sepsis, transfuse if hemoglobin less than 7.  Continue to monitor platelets.  No signs of overt bleeding at this point.  Severe calorie protein malnutrition Continue nutritional supplement per dietitian.  Polysubstance abuse  DVT prophylaxis: SCDs Code Status: Partial Family Communication: None at bedside Disposition Plan: Pending clinical improvement, wean vent as able, likely d/c to SNF for IV abx therapy.   Consultants:   PCCM  Procedures:   Trach 4/17  Antimicrobials:  Cefazolin   Objective: Vitals:   02/05/19 1000 02/05/19 1100 02/05/19 1131 02/05/19 1202  BP: (!) 126/99 (!) 117/91    Pulse: 86 80    Resp: (!) 29 17    Temp: (!) 97.5 F (36.4 C)     TempSrc:      SpO2: 100% 99% 99% 100%  Weight:      Height:        Intake/Output Summary (Last 24 hours) at 02/05/2019 1443 Last data filed at 02/05/2019 0600 Gross per 24 hour  Intake 113.84 ml  Output 885 ml  Net -771.16 ml   Filed Weights   02/03/19 0500 02/04/19 0500 02/05/19 0500  Weight: 53.5 kg 55.5 kg 55.9 kg   Examination:  General exam: Ill appearing HEENT: Nonicteric, trach midline, dry and clean. Respiratory system: Good air entry, no wheezing, rales or crackles. Cardiovascular system: K9-T2, RRR, holosystolic murmur 3/6. Gastrointestinal system: Abdomen soft, nontender, mild distended. Genitourinary: Foley catheter in place Central nervous system:  Awake, following commands.  Generalized weakness. Extremities: Bilateral lower extremity edema 1+. Skin: Warm and dry, no rashes  Data Reviewed: I have personally reviewed following labs and imaging  studies  CBC: Recent Labs  Lab 01/30/19 0238  01/31/19 0411  02/02/19 0453 02/03/19 0232 02/03/19 1901 02/04/19 0424 02/05/19 0418  WBC 3.3*  --  3.5*   < > 2.9* 1.9* 2.0* 2.3* 2.4*  NEUTROABS 2.7  --  2.9  --   --   --  1.3*  --   --   HGB 6.8*   < > 7.7*   < > 7.9* 6.2* 7.3* 7.5* 7.8*  HCT 22.0*   < > 24.3*   < > 27.0* 20.8* 24.0* 26.1* 26.3*  MCV 88.4  --  88.0   < > 92.2 94.1 93.0 92.9 92.0  PLT 71*  --  78*   < > 95* 90* 82* 88* 75*   < > = values in this interval not displayed.   Basic Metabolic Panel: Recent Labs  Lab 01/30/19 0238 01/31/19 0411  02/01/19 0508 02/02/19 0453 02/03/19 0232 02/04/19 0424 02/05/19 0418  NA 143  --    < > 147* 149* 152* 150* 148*  K 4.3  --    < > 3.5 3.8 3.2* 3.6 3.4*  CL 105  --    < > 104 108 109 108 109  CO2 26  --    < > '29 29 31 31 28  ' GLUCOSE 137*  --    < > 106* 150* 120* 117* 101*  BUN 157*  --    < > 146* 135* 119* 113* 116*  CREATININE 3.60*  --    < > 3.09* 2.86* 2.45* 2.37* 2.40*  CALCIUM 6.6*  --    < > 7.3* 7.4* 7.8* 7.8* 7.5*  MG 2.1 2.2  --   --   --   --   --  1.9  PHOS 8.8* 7.7*  --   --   --   --   --   --    < > = values in this interval not displayed.   GFR: Estimated Creatinine Clearance: 25.6 mL/min (A) (by C-G formula based on SCr of 2.4 mg/dL (H)). Liver Function Tests: Recent Labs  Lab 02/01/19 0508 02/02/19 0453 02/03/19 0232 02/04/19 0424 02/05/19 0418  AST '19 22 20 17 ' 14*  ALT <5 <5 5 <5 <5  ALKPHOS 117 100 87 85 76  BILITOT 0.5 0.3 0.5 0.7 0.5  PROT 7.7 7.3 7.2 7.2 7.3  ALBUMIN 1.1* 1.1* 1.4* 1.3* 1.3*   No results for input(s): LIPASE, AMYLASE in the last 168 hours. No results for input(s): AMMONIA in the last 168 hours. Coagulation Profile: Recent Labs  Lab 02/02/19 0950 02/03/19 1901  INR 1.7* 1.5*   Cardiac Enzymes: No results for input(s): CKTOTAL, CKMB, CKMBINDEX, TROPONINI in the last 168 hours. BNP (last 3 results) No results for input(s): PROBNP in the last 8760  hours. HbA1C: No results for input(s): HGBA1C in the last 72 hours. CBG: Recent Labs  Lab 02/04/19 1929 02/04/19 2336 02/05/19 0354 02/05/19 0733 02/05/19 1117  GLUCAP 90 94 85 93 106*   Lipid Profile: Recent Labs    02/04/19 0424  TRIG 56   Thyroid Function Tests: No results for input(s): TSH, T4TOTAL, FREET4, T3FREE, THYROIDAB in the last 72 hours. Anemia Panel: No results for input(s): VITAMINB12, FOLATE, FERRITIN, TIBC, IRON, RETICCTPCT in the last 72 hours. Sepsis Labs: No results  for input(s): PROCALCITON, LATICACIDVEN in the last 168 hours.  No results found for this or any previous visit (from the past 240 hour(s)).    Radiology Studies: Dg Abd 1 View  Result Date: 02/04/2019 CLINICAL DATA:  Abdominal pain with abdominal distention. EXAM: ABDOMEN - 1 VIEW COMPARISON:  01/21/2019 FINDINGS: Bibasilar atelectasis or infiltrate noted with left pleural effusion. Upper abdomen is largely gasless which may be related to positioning although hepato splenomegaly be a consideration. Feeding tube visualized with distal tip at the ligament of Treitz. No gaseous small bowel dilatation. IMPRESSION: No gaseous small bowel dilatation to suggest small bowel obstruction. Colon appears largely gasless. Electronically Signed   By: Misty Stanley M.D.   On: 02/04/2019 15:11   Vas [place] Lower Extremity Venous (dvt)  Result Date: 02/05/2019  Lower Venous Study Indications: Swelling, and Polysubstance abuse. IV drug abuse. Septic pulmonary emboli.  Performing Technologist: Maudry Mayhew RDMS, RVT, RDCS  Examination Guidelines: A complete evaluation includes B-mode imaging, spectral Doppler, color Doppler, and power Doppler as needed of all accessible portions of each vessel. Bilateral testing is considered an integral part of a complete examination. Limited examinations for reoccurring indications may be performed as noted.   +---------+---------------+---------+-----------+----------+--------------+  RIGHT     Compressibility Phasicity Spontaneity Properties Summary         +---------+---------------+---------+-----------+----------+--------------+  CFV       Full            No        Yes                    Pulsatile flow  +---------+---------------+---------+-----------+----------+--------------+  SFJ       Full                                                             +---------+---------------+---------+-----------+----------+--------------+  FV Prox   Full                                                             +---------+---------------+---------+-----------+----------+--------------+  FV Mid    Full                                                             +---------+---------------+---------+-----------+----------+--------------+  FV Distal Full                                                             +---------+---------------+---------+-----------+----------+--------------+  PFV       Full                                                             +---------+---------------+---------+-----------+----------+--------------+  POP       Full            No        Yes                    Pulsatile flow  +---------+---------------+---------+-----------+----------+--------------+  PTV       Full                                                             +---------+---------------+---------+-----------+----------+--------------+  PERO      Full                                                             +---------+---------------+---------+-----------+----------+--------------+   +---------+---------------+---------+-----------+----------+--------------+  LEFT      Compressibility Phasicity Spontaneity Properties Summary         +---------+---------------+---------+-----------+----------+--------------+  CFV       Full            No        Yes                    Pulsatile flow   +---------+---------------+---------+-----------+----------+--------------+  SFJ       Full                                                             +---------+---------------+---------+-----------+----------+--------------+  FV Prox   Full                                                             +---------+---------------+---------+-----------+----------+--------------+  FV Mid    Full                                                             +---------+---------------+---------+-----------+----------+--------------+  FV Distal Full                                                             +---------+---------------+---------+-----------+----------+--------------+  PFV       Full                                                             +---------+---------------+---------+-----------+----------+--------------+  POP       Full            No        Yes                    Pulsatile flow  +---------+---------------+---------+-----------+----------+--------------+  PTV       Full                                                             +---------+---------------+---------+-----------+----------+--------------+  PERO      Full                                                             +---------+---------------+---------+-----------+----------+--------------+     Summary: Right: There is no evidence of deep vein thrombosis in the lower extremity. No cystic structure found in the popliteal fossa. Left: There is no evidence of deep vein thrombosis in the lower extremity. No cystic structure found in the popliteal fossa.  Bilateral lower extremity veins exhibit pulsatile flow, suggestive of elevated right-sided heart pressure. *See table(s) above for measurements and observations. Electronically signed by Harold Barban MD on 02/05/2019 at 1:02:23 PM.    Final     Scheduled Meds:  acetaminophen  650 mg Per Tube Q6H   chlorhexidine gluconate (MEDLINE KIT)  15 mL Mouth Rinse BID   Chlorhexidine  Gluconate Cloth  6 each Topical Q0600   feeding supplement (PRO-STAT SUGAR FREE 64)  30 mL Oral Daily   fentaNYL  1 patch Transdermal Q72H   free water  350 mL Per Tube Q8H   furosemide  60 mg Intravenous Daily   heparin injection (subcutaneous)  5,000 Units Subcutaneous Q8H   mouth rinse  15 mL Mouth Rinse 10 times per day   pantoprazole sodium  40 mg Per Tube Daily   Thrombi-Pad  1 each Topical Once   vitamin C  250 mg Per NG tube BID   Continuous Infusions:  sodium chloride 1,000 mL (02/05/19 0912)    ceFAZolin (ANCEF) IV 2 g (02/05/19 0912)   feeding supplement (OSMOLITE 1.5 CAL) 1,000 mL (02/05/19 0912)     LOS: 16 days   Time spent: Total of 35 minutes spent with pt, greater than 50% of which was spent in discussion of  treatment, counseling and coordination of care  Chipper Oman, MD  How to contact the [facility] Attending or Consulting provider [place] or covering provider during after hours 7P -7A, for this patient?  1. Check the care team in [facility] and look for a) attending/consulting [facility] provider listed and b) the [facility] team listed 2. Log into [email or web address] and use 's universal password to access. If you do not have the password, please contact the hospital operator. 3. Locate the [facility] provider you are looking for under [facility] and page to a number that you can be directly reached. 4. If you still have difficulty reaching the provider, please page the [facility] (Director on Call) for the Hospitalists listed on amion for assistance.

## 2019-02-05 NOTE — Evaluation (Signed)
SLP Cancellation Note  Patient Details Name: [name] MRN: [id number] DOB: 1986/05/17   Cancelled treatment:       Reason Eval/Treat Not Completed: Other (comment)(pt remains on full vent support)   Macario Golds 02/05/2019, 7:34 AM   Luanna Salk, [place] SLP Acute Rehab Services Pager [phone] Office [phone]

## 2019-02-05 NOTE — Progress Notes (Signed)
Rehab Admissions Coordinator Note:  Patient was screened by Cleatrice Burke for appropriateness for an Inpatient Acute Rehab Consult per OT recs. IVDU, need for longterm antibiotics.   At this time, we are recommending [place].  Cleatrice Burke RN MSN 02/05/2019, 2:24 PM  I can be reached at [phone].

## 2019-02-05 NOTE — Progress Notes (Signed)
NAME:  Molly [name], MRN:  [id number], DOB:  23-May-1986, LOS: 3 ADMISSION DATE:  01/20/2019, CONSULTATION DATE:  02/05/2019 REFERRING MD:  Beryl Meager, CHIEF COMPLAINT:  Sepsis   HPI/course in hospital  33 year old woman with IVDU. Admitted with MSSA sepsis with pulmonary septic emboli on 04/04. By 4/10 resolving AKI with improving urine output.  Tolerating diuresis. Sepsis resolved and long-term antibiotics recommended. Tracheostomy placed 4/17 for failure to wean.  Past Medical History   Past Medical History:  Diagnosis Date  . 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 Abscess of skin    buttock - most recently 2009/10  . CKD (chronic kidney disease) stage 3, GFR 30-59 ml/min (HCC)   . Drug-seeking behavior   . Hypersplenism   . IV drug user   . MRSA (methicillin resistant Staphylococcus aureus) infection   . MRSA bacteremia   . Pancytopenia ([place])   . 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 Septic pulmonary embolism ([place])   . Tobacco abuse      Past Surgical History:  Procedure Laterality Date  . IR GENERIC HISTORICAL  07/19/2016   IR LUMBAR DISC ASPIRATION W/IMG GUIDE 07/19/2016 Arne Cleveland, MD [facility]-INTERV RAD  . RADIOLOGY WITH ANESTHESIA N/A 04/11/2018   Procedure: MRI OF LUMBAR AND THORACIC SPINE WITHOUT CONTRAST WITH ANESTHESIA;  Surgeon: Radiologist, Medication, MD;  Location: [place];  Service: Radiology;  Laterality: N/A;  . TEE WITHOUT CARDIOVERSION N/A 07/04/2013   Procedure: TRANSESOPHAGEAL ECHOCARDIOGRAM (TEE);  Surgeon: Larey Dresser, MD;  Location: [facility] ENDOSCOPY;  Service: Cardiovascular;  Laterality: N/A;  . TONSILLECTOMY     33 years old    Interim history/subjective:  Abdominal pain improving following bowel movement yesterday. Did not tolerate weaning trials yesterday due to hypoventilation.  Objective   Blood pressure 118/89, pulse (!) 59, temperature (!) 97.5 F (36.4 C), resp. rate 15, height 5\' 1"  (1.549 m), weight 55.9 kg, SpO2 100 %.     Vent Mode: PRVC FiO2 (%):  [40 %] 40 % Set Rate:  [15 bmp] 15 bmp Vt Set:  [450 mL-460 mL] 450 mL PEEP:  [5 cmH20] 5 cmH20 Plateau Pressure:  [17 cmH20-19 cmH20] 19 cmH20   Intake/Output Summary (Last 24 hours) at 02/05/2019 0800 Last data filed at 02/05/2019 0600 Gross per 24 hour  Intake 592.77 ml  Output 1055 ml  Net -462.23 ml   Filed Weights   02/03/19 0500 02/04/19 0500 02/05/19 0500  Weight: 53.5 kg 55.5 kg 55.9 kg    Examination: Physical Exam Constitutional:      Appearance: She is ill-appearing. She is not diaphoretic.  HENT:     Head: Atraumatic.     Nose:     Comments: Small bore feeding tube in place Neck:     Comments: Tracheostomy in place.  No bleeding. Cardiovascular:     Rate and Rhythm: Normal rate and regular rhythm.     Heart sounds: Murmur (Holosystolic murmur 3/6 at right sternal border.) present.  Pulmonary:     Breath sounds: Rales present.     Comments: Continues to have hypoventilation with low tidal volume on PSVT 10/5. Abdominal:     General: Bowel sounds are normal. There is distension.  Genitourinary:    Comments: Foley catheter in place Musculoskeletal:        General: Swelling present.     Right lower leg: Edema present.     Left lower leg: Edema present.  Skin:    General: Skin  is warm and dry.  Neurological:     General: No focal deficit present.     Mental Status: She is alert.     Motor: Weakness (Generalized 4-/5 strength.) present.      Ancillary tests (personally reviewed)  CBC: Recent Labs  Lab 01/30/19 0238  01/31/19 0411  02/02/19 0453 02/03/19 0232 02/03/19 1901 02/04/19 0424 02/05/19 0418  WBC 3.3*  --  3.5*   < > 2.9* 1.9* 2.0* 2.3* 2.4*  NEUTROABS 2.7  --  2.9  --   --   --  1.3*  --   --   HGB 6.8*   < > 7.7*   < > 7.9* 6.2* 7.3* 7.5* 7.8*  HCT 22.0*   < > 24.3*   < > 27.0* 20.8* 24.0* 26.1* 26.3*  MCV 88.4  --  88.0   < > 92.2 94.1 93.0 92.9 92.0  PLT 71*  --  78*   < > 95* 90* 82* 88* 75*   < > =  values in this interval not displayed.    Basic Metabolic Panel: Recent Labs  Lab 01/30/19 0238 01/31/19 0411  02/01/19 0508 02/02/19 0453 02/03/19 0232 02/04/19 0424 02/05/19 0418  NA 143  --    < > 147* 149* 152* 150* 148*  K 4.3  --    < > 3.5 3.8 3.2* 3.6 3.4*  CL 105  --    < > 104 108 109 108 109  CO2 26  --    < > 29 29 31 31 28   GLUCOSE 137*  --    < > 106* 150* 120* 117* 101*  BUN 157*  --    < > 146* 135* 119* 113* 116*  CREATININE 3.60*  --    < > 3.09* 2.86* 2.45* 2.37* 2.40*  CALCIUM 6.6*  --    < > 7.3* 7.4* 7.8* 7.8* 7.5*  MG 2.1 2.2  --   --   --   --   --  1.9  PHOS 8.8* 7.7*  --   --   --   --   --   --    < > = values in this interval not displayed.   GFR: Estimated Creatinine Clearance: 25.6 mL/min (A) (by C-G formula based on SCr of 2.4 mg/dL (H)). Recent Labs  Lab 02/03/19 0232 02/03/19 1901 02/04/19 0424 02/05/19 0418  WBC 1.9* 2.0* 2.3* 2.4*    Liver Function Tests: Recent Labs  Lab 02/01/19 0508 02/02/19 0453 02/03/19 0232 02/04/19 0424 02/05/19 0418  AST 19 22 20 17  14*  ALT <5 <5 5 <5 <5  ALKPHOS 117 100 87 85 76  BILITOT 0.5 0.3 0.5 0.7 0.5  PROT 7.7 7.3 7.2 7.2 7.3  ALBUMIN 1.1* 1.1* 1.4* 1.3* 1.3*   No results for input(s): LIPASE, AMYLASE in the last 168 hours. No results for input(s): AMMONIA in the last 168 hours.  ABG    Component Value Date/Time   PHART 7.270 (L) 01/20/2019 1559   PCO2ART 38.0 01/20/2019 1559   PO2ART 90.0 01/20/2019 1559   HCO3 17.4 (L) 01/20/2019 1559   TCO2 19 (L) 01/20/2019 1559   ACIDBASEDEF 9.0 (H) 01/20/2019 1559   O2SAT 96.0 01/20/2019 1559     Coagulation Profile: Recent Labs  Lab 02/02/19 0950 02/03/19 1901  INR 1.7* 1.5*    Cardiac Enzymes: No results for input(s): CKTOTAL, CKMB, CKMBINDEX, TROPONINI in the last 168 hours.  HbA1C: No results found for: HGBA1C  CBG: Recent Labs  Lab 02/04/19 1602 02/04/19 1929 02/04/19 2336 02/05/19 0354 02/05/19 0733  GLUCAP 96 90 94  85 93     Assessment & Plan:  Critically ill due to respiratory failure requiring mechanical ventilation via tracheostomy MSSA bacteremia now cleared.  No pressor requirements Tricuspid endocarditis treated with cefazolin Acute kidney injury - slow improvement still edematous Hypernatremia -improving with free water administration Pancytopenia Generalized neuromuscular weakness IV drug abuse   Plan:  Continue daily PSV wean.  If tolerating, can transition to twice daily trach mask trials Continue current sedation strategy PT OT consult Continue to diurese, follow electrolytes daily Follow cell count Complete 6 weeks of antibiotic therapy.  Best practice:  Diet: Resume tube feeds at goal Pain/Anxiety/Delirium protocol (if indicated): Fentanyl patch and as needed hydromorphone VAP protocol (if indicated): Bundle in place DVT prophylaxis: Unfractionated heparin 3 times daily GI prophylaxis: Pantoprazole Urinary catheter: Perineal skin breakdown Glucose control: Euglycemic without additional coverage Mobility: Mobilize to chair Code Status: DNR Family Communication: Mother to be updated Disposition: Continue to attempt to wean ventilator.  Try pressure support wean.  CRITICAL CARE Performed by: Kipp Brood   Total critical care time: 35 minutes  Critical care time was exclusive of separately billable procedures and treating other patients.  Critical care was necessary to treat or prevent imminent or life-threatening deterioration.  Critical care was time spent personally by me on the following activities: development of treatment plan with patient and/or surrogate as well as nursing, discussions with consultants, evaluation of patient's response to treatment, examination of patient, obtaining history from patient or surrogate, ordering and performing treatments and interventions, ordering and review of laboratory studies, ordering and review of radiographic studies, pulse  oximetry, re-evaluation of patient's condition and participation in multidisciplinary rounds.  Kipp Brood, MD [facility] ICU Physician [place]  Pager: [phone] Mobile: [phone] After hours: [phone].    02/05/2019, 8:00 AM

## 2019-02-05 NOTE — Procedures (Signed)
Interventional Radiology Procedure Note  Procedure: Tunneled CVC placement  Complications: None  Estimated Blood Loss: < 10 mL  Findings: 6 Fr, DL tunneled CVC placed via right IJ vein. Cut to 21 cm. Tip at SVC/RA junction. OK to use.  Venetia Night. Kathlene Cote, M.D Pager:  [phone]

## 2019-02-05 NOTE — Progress Notes (Signed)
Lower extremity venous duplex completed. Refer to "CV Proc" under chart review to view preliminary results.  02/05/2019 10:45 AM Maudry Mayhew, MHA, RVT, RDCS, RDMS

## 2019-02-05 NOTE — Evaluation (Signed)
Occupational Therapy Evaluation Patient Details Name: Molly [name] MRN: [id number] DOB: October 30, 1985 Today's Date: 02/05/2019    History of Present Illness Pt adm for acute respiratory failure due to Septic pulmonary embolus due to MRSA tricuspid vegetation. Pt intubated 01/20/19. PMH - polysubstance abuse, inadequately treated MRSA endocarditis having signed out AMA  in July 2019, osteomyelitis of spine   Clinical Impression   PTA, pt was living her parents and was independent. Pt currently requiring Min A for UB ADLs, Mod A +2 for LB ADLs, and Mod A +2 for sit<>stand with stedy. Pt motivated to participate in therapy and agreeable to get OOB to recliner. Pt presenting with decreased strength, balance, and activity tolerance. Pt would benefit from further acute OT to facilitate safe dc. Recommend dc to CIR for further OT to optimize safety, independence with ADLs, and return to PLOF.      Follow Up Recommendations  CIR;Supervision/Assistance - 24 hour    Equipment Recommendations  Other (comment)(Defer to next venue)    Recommendations for Other Services PT consult     Precautions / Restrictions Precautions Precautions: Fall;Other (comment) Precaution Comments: vent Restrictions Weight Bearing Restrictions: No      Mobility Bed Mobility Overal bed mobility: Needs Assistance Bed Mobility: Supine to Sit     Supine to sit: Mod assist;+2 for safety/equipment     General bed mobility comments: Mod A for initating BLEs towards EOB and then elevating trunk  Transfers Overall transfer level: Needs assistance   Transfers: Sit to/from Stand Sit to Stand: Mod assist;+2 physical assistance         General transfer comment: Mod A +2 for power up into standing and then maintain standing before sitting on stedy    Balance Overall balance assessment: Needs assistance Sitting-balance support: No upper extremity supported;Feet supported Sitting balance-Leahy Scale: Fair Sitting  balance - Comments: Min Guard A for safety at EOB   Standing balance support: Bilateral upper extremity supported;During functional activity Standing balance-Leahy Scale: Poor Standing balance comment: Reliant on UE support and physical A                           ADL either performed or assessed with clinical judgement   ADL Overall ADL's : Needs assistance/impaired Eating/Feeding: NPO   Grooming: Minimal assistance;Sitting   Upper Body Bathing: Minimal assistance;Sitting   Lower Body Bathing: Moderate assistance;Sitting/lateral leans;Bed level;Sit to/from stand   Upper Body Dressing : Minimal assistance;Sitting   Lower Body Dressing: Moderate assistance;+2 for physical assistance;Sit to/from stand;Bed level Lower Body Dressing Details (indicate cue type and reason): Pt able to bring ankles to knees while supine in bed and don socks with significant amount of time Toilet Transfer: Moderate assistance;+2 for physical assistance(sit<>stand with stedy) Toilet Transfer Details (indicate cue type and reason): Mod A +2 to power up and maintain standing. Use of stedy. simulated to recliner         Functional mobility during ADLs: Moderate assistance;+2 for physical assistance(stedy) General ADL Comments: Pt presenting with decreased strength, balance, and activity tolerance. Motivated to participate in therapy     Vision Baseline Vision/History: Wears glasses       Perception     Praxis      Pertinent Vitals/Pain Pain Assessment: Faces Faces Pain Scale: Hurts even more Pain Location: Back Pain Descriptors / Indicators: Constant;Discomfort;Grimacing Pain Intervention(s): Monitored during session;Limited activity within patient's tolerance;Repositioned     Hand Dominance     Extremity/Trunk Assessment Upper  Extremity Assessment Upper Extremity Assessment: Generalized weakness   Lower Extremity Assessment Lower Extremity Assessment: Generalized weakness        Communication Communication Communication: Tracheostomy(vent)   Cognition Arousal/Alertness: Awake/alert Behavior During Therapy: WFL for tasks assessed/performed Overall Cognitive Status: Difficult to assess Area of Impairment: Following commands;Problem solving                       Following Commands: Follows one step commands consistently;Follows one step commands with increased time     Problem Solving: Slow processing General Comments: Pt following commands. Requiring increased time throughout. Will need to further assess   General Comments  VSS.    Exercises     Shoulder Instructions      Home Living Family/patient expects to be discharged to:: Private residence Living Arrangements: Parent                               Additional Comments: Pt unable to give details of set up      Prior Functioning/Environment Level of Independence: Independent                 OT Problem List: Decreased strength;Decreased range of motion;Decreased activity tolerance;Impaired balance (sitting and/or standing);Decreased knowledge of use of DME or AE;Decreased knowledge of precautions;Pain      OT Treatment/Interventions: Self-care/ADL training;Therapeutic exercise;Energy conservation;DME and/or AE instruction;Therapeutic activities;Patient/family education;Balance training    OT Goals(Current goals can be found in the care plan section) Acute Rehab OT Goals Patient Stated Goal: Agreeable to OOB to recliner OT Goal Formulation: With patient Time For Goal Achievement: 02/19/19 Potential to Achieve Goals: Good  OT Frequency: Min 2X/week   Barriers to D/C:            Co-evaluation PT/OT/SLP Co-Evaluation/Treatment: Yes Reason for Co-Treatment: Complexity of the patient's impairments (multi-system involvement);For patient/therapist safety;To address functional/ADL transfers   OT goals addressed during session: ADL's and self-care      AM-PAC  OT "6 Clicks" Daily Activity     Outcome Measure Help from another person eating meals?: Total Help from another person taking care of personal grooming?: A Little Help from another person toileting, which includes using toliet, bedpan, or urinal?: A Lot Help from another person bathing (including washing, rinsing, drying)?: A Lot Help from another person to put on and taking off regular upper body clothing?: A Little Help from another person to put on and taking off regular lower body clothing?: A Lot 6 Click Score: 13   End of Session Equipment Utilized During Treatment: Gait belt;Other (comment)(stedy) Nurse Communication: Mobility status;Need for lift equipment  Activity Tolerance: Patient tolerated treatment well;Patient limited by pain;Patient limited by fatigue Patient left: in chair;with call bell/phone within reach;with chair alarm set  OT Visit Diagnosis: Other abnormalities of gait and mobility (R26.89);Unsteadiness on feet (R26.81);Muscle weakness (generalized) (M62.81);Other symptoms and signs involving cognitive function;Pain Pain - part of body: (Pain)                Time: 5284-1324 OT Time Calculation (min): 35 min Charges:  OT General Charges $OT Visit: 1 Visit OT Evaluation $OT Eval Moderate Complexity: Jordan, OTR/L Acute Rehab Pager: [phone] Office: [place] 02/05/2019, 2:09 PM

## 2019-02-06 ENCOUNTER — Inpatient Hospital Stay (HOSPITAL_COMMUNITY): Payer: Medicaid Other

## 2019-02-06 DIAGNOSIS — J9601 Acute respiratory failure with hypoxia: Secondary | ICD-10-CM

## 2019-02-06 LAB — BASIC METABOLIC PANEL
Anion gap: 11 (ref 5–15)
BUN: 118 mg/dL — ABNORMAL HIGH (ref 6–20)
CO2: 28 mmol/L (ref 22–32)
Calcium: 7.6 mg/dL — ABNORMAL LOW (ref 8.9–10.3)
Chloride: 110 mmol/L (ref 98–111)
Creatinine, Ser: 2.54 mg/dL — ABNORMAL HIGH (ref 0.44–1.00)
GFR calc Af Amer: 28 mL/min — ABNORMAL LOW (ref >60)
GFR calc non Af Amer: 24 mL/min — ABNORMAL LOW (ref >60)
Glucose, Bld: 125 mg/dL — ABNORMAL HIGH (ref 70–99)
Potassium: 3.3 mmol/L — ABNORMAL LOW (ref 3.5–5.1)
Sodium: 149 mmol/L — ABNORMAL HIGH (ref 135–145)

## 2019-02-06 LAB — CBC WITH DIFFERENTIAL/PLATELET
Abs Immature Granulocytes: 0.01 10*3/uL (ref 0.00–0.07)
Basophils Absolute: 0 10*3/uL (ref 0.0–0.1)
Basophils Relative: 0 %
Eosinophils Absolute: 0 10*3/uL (ref 0.0–0.5)
Eosinophils Relative: 0 %
HCT: 27.2 % — ABNORMAL LOW (ref 36.0–46.0)
Hemoglobin: 8.3 g/dL — ABNORMAL LOW (ref 12.0–15.0)
Immature Granulocytes: 0 %
Lymphocytes Relative: 17 %
Lymphs Abs: 0.5 10*3/uL — ABNORMAL LOW (ref 0.7–4.0)
MCH: 28.6 pg (ref 26.0–34.0)
MCHC: 30.5 g/dL (ref 30.0–36.0)
MCV: 93.8 fL (ref 80.0–100.0)
Monocytes Absolute: 0.2 10*3/uL (ref 0.1–1.0)
Monocytes Relative: 7 %
Neutro Abs: 2.2 10*3/uL (ref 1.7–7.7)
Neutrophils Relative %: 76 %
Platelets: 95 10*3/uL — ABNORMAL LOW (ref 150–400)
RBC: 2.9 MIL/uL — ABNORMAL LOW (ref 3.87–5.11)
RDW: 15.6 % — ABNORMAL HIGH (ref 11.5–15.5)
WBC: 2.9 10*3/uL — ABNORMAL LOW (ref 4.0–10.5)
nRBC: 0 % (ref 0.0–0.2)

## 2019-02-06 LAB — GLUCOSE, CAPILLARY
Glucose-Capillary: 105 mg/dL — ABNORMAL HIGH (ref 70–99)
Glucose-Capillary: 108 mg/dL — ABNORMAL HIGH (ref 70–99)
Glucose-Capillary: 108 mg/dL — ABNORMAL HIGH (ref 70–99)
Glucose-Capillary: 113 mg/dL — ABNORMAL HIGH (ref 70–99)
Glucose-Capillary: 125 mg/dL — ABNORMAL HIGH (ref 70–99)
Glucose-Capillary: 125 mg/dL — ABNORMAL HIGH (ref 70–99)

## 2019-02-06 LAB — MAGNESIUM: Magnesium: 2 mg/dL (ref 1.7–2.4)

## 2019-02-06 MED ORDER — SALINE SPRAY 0.65 % NA SOLN
1.0000 | NASAL | Status: DC | PRN
  Filled 2019-02-06: qty 44

## 2019-02-06 MED ORDER — OSMOLITE 1.5 CAL PO LIQD
237.0000 mL | Freq: Four times a day (QID) | ORAL | Status: DC
  Filled 2019-02-06 (×3): qty 237

## 2019-02-06 MED ORDER — OSMOLITE 1.5 CAL PO LIQD
237.0000 mL | Freq: Four times a day (QID) | ORAL | Status: AC
  Administered 2019-02-06 – 2019-02-07 (×6): 237 mL
  Filled 2019-02-06: qty 1000
  Filled 2019-02-06: qty 237
  Filled 2019-02-06 (×4): qty 1000
  Filled 2019-02-06: qty 237

## 2019-02-06 MED ORDER — SODIUM CHLORIDE 0.9% FLUSH
10.0000 mL | Freq: Two times a day (BID) | INTRAVENOUS | Status: DC
  Administered 2019-02-06 – 2019-02-07 (×3): 10 mL
  Administered 2019-02-07: 30 mL
  Administered 2019-02-08 – 2019-02-16 (×14): 10 mL
  Administered 2019-02-16: 23:00:00 20 mL
  Administered 2019-02-17 – 2019-03-03 (×24): 10 mL

## 2019-02-06 MED ORDER — FLUCONAZOLE 100 MG PO TABS
100.0000 mg | ORAL_TABLET | Freq: Every day | ORAL | Status: AC
  Administered 2019-02-06 – 2019-02-08 (×3): 100 mg via ORAL
  Filled 2019-02-06 (×3): qty 1

## 2019-02-06 MED ORDER — CHLORHEXIDINE GLUCONATE CLOTH 2 % EX PADS
6.0000 | MEDICATED_PAD | Freq: Every day | CUTANEOUS | Status: DC
  Administered 2019-02-07 – 2019-03-05 (×25): 6 via TOPICAL

## 2019-02-06 MED ORDER — POTASSIUM CHLORIDE 20 MEQ/15ML (10%) PO SOLN
40.0000 meq | Freq: Once | ORAL | Status: AC
  Administered 2019-02-06: 40 meq via ORAL
  Filled 2019-02-06: qty 30

## 2019-02-06 MED ORDER — OSMOLITE 1.5 CAL PO LIQD
237.0000 mL | Freq: Four times a day (QID) | ORAL | Status: DC
  Administered 2019-02-08: 237 mL
  Filled 2019-02-06 (×3): qty 237

## 2019-02-06 MED ORDER — PRO-STAT SUGAR FREE PO LIQD
30.0000 mL | Freq: Two times a day (BID) | ORAL | Status: DC
  Administered 2019-02-06 – 2019-02-14 (×15): 30 mL
  Filled 2019-02-06 (×15): qty 30

## 2019-02-06 MED ORDER — SODIUM CHLORIDE 0.9% FLUSH: 10.0000 mL | INTRAVENOUS | Status: DC | PRN

## 2019-02-06 MED ORDER — OSMOLITE HN PO LIQD
237.0000 mL | Freq: Every day | ORAL | Status: DC
  Filled 2019-02-06 (×4): qty 1

## 2019-02-06 NOTE — Progress Notes (Signed)
Pt continues to tolerate ATC well. FiO2 weaned to 35% per SpO2 of 100%. Pt states it feels much better to be off the vent. Will leave on ATC/wean FiO2 as tolerated. RT will continue to monitor.

## 2019-02-06 NOTE — Progress Notes (Signed)
NAME:  Molly [name], MRN:  [id number], DOB:  July 15, 1986, LOS: 96 ADMISSION DATE:  01/20/2019, CONSULTATION DATE: 01/20/2019 REFERRING MD:  Bonner Puna, CHIEF COMPLAINT:  Sepsis  Brief History   33 y/o F with a history of IV drug abuse/polysubstance abuse (presented with urine drug screen positive for opiates and amphetamines) and a previous history in June or July 2019 of inadequately treated MRSA endocarditis having signed out AMA at that time.  She had a tricuspid vegetation at that time.  Admitted 4/4 hypotensive and in respiratory distress requiring intubation at [facility] long.   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 Awake / FC. Looks best in many days. Failed SBT 4/12 Failed SBT.  AKI improving, tx PRBC.  Less alert on precedex, methadone  4/13 More alert, FC. On precedex. Failed SBT. ID recommending PICC for antibiotcs > mother not sure about goals of care. S/p 1 unit PRBC   4/17 Trach placement  Consults:  Nephrology  Procedures:  ETT 4/4 >> 4/17 R Femoral line 4/4 >> 4/8 R IJ Tunneled CVC (IR) 4/21 >>  Trach 4/17   Significant Diagnostic Tests:  Echocardiogram 4/4 >> TV vegetation measures 1.5 mm x 0.6 mm., severe TR CT Chest 4/4 >> bilateral pulmonary nodules, peripheral and cavitated.   Micro Data:  Blood cultures x2 4/4 >> MSSA  BCx2 4/8 >> negative    Antimicrobials:  Vancomycin Zosyn  Teflaro >> stopped Ancef 4/4 >> Diflucan 4/18 >> 4/21  Interim history/subjective:  Afebrile.  I/O- UOP 910 in last 24 hours, remains positive balance. Pt asking for food / water.   Objective   Blood pressure 117/86, pulse 87, temperature 98.6 F (37 C), temperature source Oral, resp. rate 17, height 5\' 1"  (1.549 m), weight 55.3 kg, SpO2 100 %.    Vent Mode: PRVC FiO2 (%):  [40 %] 40 % Set Rate:  [15 bmp] 15 bmp Vt Set:  [450 mL] 450 mL PEEP:  [5 cmH20] 5 cmH20 Plateau Pressure:  [15 cmH20-18 cmH20] 18 cmH20   Intake/Output Summary (Last 24 hours) at 02/06/2019 1204 Last data filed at 02/06/2019 1100 Gross per 24 hour  Intake 1551.87 ml  Output 1125 ml  Net 426.87 ml   Filed Weights   02/04/19 0500 02/05/19 0500 02/06/19 0500  Weight: 55.5 kg 55.9 kg 55.3 kg   General: chronically ill appearing female lying in bed HEENT: MM pink/moist, trach midline c/d/i Neuro: Awake, alert, communicating appropriately, MAE CV: s1s2 rrr, no m/r/g PULM: even/non-labored, lungs bilaterally clear  KD:XIPJ, non-tender, bsx4 active  Extremities: warm/dry, no edema  Skin: small abrasions/lesions on toes (? From sepsis vs prior pressors) improving from past exam  LABS    PULMONARY No results for input(s): PHART, PCO2ART, PO2ART, HCO3, TCO2, O2SAT in the last 168 hours.  Invalid input(s): PCO2, PO2  CBC Recent Labs  Lab 02/04/19 0424 02/05/19 0418 02/06/19 0253  HGB 7.5* 7.8* 8.3*  HCT 26.1* 26.3* 27.2*  WBC 2.3*  2.4* 2.9*  PLT 88* 75* 95*    COAGULATION Recent Labs  Lab 02/02/19 0950 02/03/19 1901  INR 1.7* 1.5*    CARDIAC  No results for input(s): TROPONINI in the last 168 hours. No results for input(s): PROBNP in the last 168 hours.   CHEMISTRY Recent Labs  Lab 01/31/19 0411  02/02/19 0453 02/03/19 0232 02/04/19 0424 02/05/19 0418 02/06/19 0253  NA  --    < > 149* 152* 150* 148* 149*  K  --    < > 3.8 3.2* 3.6 3.4* 3.3*  CL  --    < > 108 109 108 109 110    CO2  --    < > 29 31 31 28 28   GLUCOSE  --    < > 150* 120* 117* 101* 125*  BUN  --    < > 135* 119* 113* 116* 118*  CREATININE  --    < > 2.86* 2.45* 2.37* 2.40* 2.54*  CALCIUM  --    < > 7.4* 7.8* 7.8* 7.5* 7.6*  MG 2.2  --   --   --   --  1.9 2.0  PHOS 7.7*  --   --   --   --   --   --    < > = values in this interval not displayed.   Estimated Creatinine Clearance: 24.2 mL/min (A) (by C-G formula based on SCr of 2.54 mg/dL (H)).   LIVER Recent Labs  Lab 02/01/19 0508 02/02/19 0453 02/02/19 0950 02/03/19 0232 02/03/19 1901 02/04/19 0424 02/05/19 0418  AST 19 22  --  20  --  17 14*  ALT <5 <5  --  5  --  <5 <5  ALKPHOS 117 100  --  87  --  85 76  BILITOT 0.5 0.3  --  0.5  --  0.7 0.5  PROT 7.7 7.3  --  7.2  --  7.2 7.3  ALBUMIN 1.1* 1.1*  --  1.4*  --  1.3* 1.3*  INR  --   --  1.7*  --  1.5*  --   --      INFECTIOUS No results for input(s): LATICACIDVEN, PROCALCITON in the last 168 hours.   ENDOCRINE CBG (last 3)  Recent Labs    02/06/19 0438 02/06/19 0734 02/06/19 1109  GLUCAP 108* 125* 113*    IMAGING   Dg Abd 1 View  Result Date: 02/04/2019 CLINICAL DATA:  Abdominal pain with abdominal distention. EXAM: ABDOMEN - 1 VIEW COMPARISON:  01/21/2019 FINDINGS: Bibasilar atelectasis or infiltrate noted with left pleural effusion. Upper abdomen is largely gasless which may be related to positioning although hepato splenomegaly be a consideration. Feeding tube visualized with distal tip at the ligament of Treitz. No gaseous small bowel dilatation. IMPRESSION: No gaseous small bowel dilatation to suggest small bowel obstruction. Colon appears largely gasless. Electronically Signed   By: Misty Stanley M.D.   On: 02/04/2019 15:11   Ir Fluoro Guide Cv Line Right  Result Date: 02/05/2019 CLINICAL DATA:  Sepsis, endocarditis and need for long-term IV antibiotics. EXAM: TUNNELED CENTRAL VENOUS CATHETER PLACEMENT WITH ULTRASOUND AND FLUOROSCOPIC GUIDANCE ANESTHESIA/SEDATION:  None MEDICATIONS: None FLUOROSCOPY TIME:  12 seconds.  3.2 mGy. PROCEDURE: The procedure, risks, benefits, and alternatives were explained to the patient's mother. Questions regarding the procedure were encouraged and answered. The patient's mother understands and consents to the procedure. A timeout was performed prior to initiating the procedure. The right neck and chest were  prepped with chlorhexidine in a sterile fashion, and a sterile drape was applied covering the operative field. Maximum barrier sterile technique with sterile gowns and gloves were used for the procedure. Local anesthesia was provided with 1% lidocaine. Ultrasound was used to confirm patency of the right internal jugular vein. After creating a small venotomy incision, a 21 gauge needle was advanced into the right internal jugular vein under direct, real-time ultrasound guidance. Ultrasound image documentation was performed. After securing guidewire access, a 6 French peel-away sheath was placed. A wire was kinked to measure appropriate catheter length. A 6 Fr, dual lumen Power line tunneled central venous catheter was chosen for placement. This was tunneled in a retrograde fashion from the chest wall to the venotomy incision. The catheter was cut to 21 cm based on guidewire measurement. The catheter was then placed through the peel-away sheath and the sheath removed. Final catheter positioning was confirmed and documented with a fluoroscopic spot image. The catheter was aspirated and flushed with saline. The venotomy incision was closed with subcutaneous 4-0 Vicryl. Dermabond was applied to the incision. The catheter exit site was secured with Prolene retention sutures. COMPLICATIONS: None.  No pneumothorax. FINDINGS: After catheter placement, the tip lies at the SVC/RA junction. The catheter aspirates normally and is ready for immediate use. IMPRESSION: Placement of tunneled central venous catheter via the right internal jugular vein. The  catheter tip lies at the SVC/RA junction. The catheter is ready for immediate use. Electronically Signed   By: Aletta Edouard M.D.   On: 02/05/2019 16:55   Ir US Guide Vasc Access Right  Result Date: 02/05/2019 CLINICAL DATA:  Sepsis, endocarditis and need for long-term IV antibiotics. EXAM: TUNNELED CENTRAL VENOUS CATHETER PLACEMENT WITH ULTRASOUND AND FLUOROSCOPIC GUIDANCE ANESTHESIA/SEDATION: None MEDICATIONS: None FLUOROSCOPY TIME:  12 seconds.  3.2 mGy. PROCEDURE: The procedure, risks, benefits, and alternatives were explained to the patient's mother. Questions regarding the procedure were encouraged and answered. The patient's mother understands and consents to the procedure. A timeout was performed prior to initiating the procedure. The right neck and chest were prepped with chlorhexidine in a sterile fashion, and a sterile drape was applied covering the operative field. Maximum barrier sterile technique with sterile gowns and gloves were used for the procedure. Local anesthesia was provided with 1% lidocaine. Ultrasound was used to confirm patency of the right internal jugular vein. After creating a small venotomy incision, a 21 gauge needle was advanced into the right internal jugular vein under direct, real-time ultrasound guidance. Ultrasound image documentation was performed. After securing guidewire access, a 6 French peel-away sheath was placed. A wire was kinked to measure appropriate catheter length. A 6 Fr, dual lumen Power line tunneled central venous catheter was chosen for placement. This was tunneled in a retrograde fashion from the chest wall to the venotomy incision. The catheter was cut to 21 cm based on guidewire measurement. The catheter was then placed through the peel-away sheath and the sheath removed. Final catheter positioning was confirmed and documented with a fluoroscopic spot image. The catheter was aspirated and flushed with saline. The venotomy incision was closed with  subcutaneous 4-0 Vicryl. Dermabond was applied to the incision. The catheter exit site was secured with Prolene retention sutures. COMPLICATIONS: None.  No pneumothorax. FINDINGS: After catheter placement, the tip lies at the SVC/RA junction. The catheter aspirates normally and is ready for immediate use. IMPRESSION: Placement of tunneled central venous catheter via the right internal jugular vein. The catheter tip lies at the  SVC/RA junction. The catheter is ready for immediate use. Electronically Signed   By: Aletta Edouard M.D.   On: 02/05/2019 16:55   Vas [place] Lower Extremity Venous (dvt)  Result Date: 02/05/2019  Lower Venous Study Indications: Swelling, and Polysubstance abuse. IV drug abuse. Septic pulmonary emboli.  Performing Technologist: Maudry Mayhew RDMS, RVT, RDCS  Examination Guidelines: A complete evaluation includes B-mode imaging, spectral Doppler, color Doppler, and power Doppler as needed of all accessible portions of each vessel. Bilateral testing is considered an integral part of a complete examination. Limited examinations for reoccurring indications may be performed as noted.  +---------+---------------+---------+-----------+----------+--------------+  RIGHT     Compressibility Phasicity Spontaneity Properties Summary         +---------+---------------+---------+-----------+----------+--------------+  CFV       Full            No        Yes                    Pulsatile flow  +---------+---------------+---------+-----------+----------+--------------+  SFJ       Full                                                             +---------+---------------+---------+-----------+----------+--------------+  FV Prox   Full                                                             +---------+---------------+---------+-----------+----------+--------------+  FV Mid    Full                                                              +---------+---------------+---------+-----------+----------+--------------+  FV Distal Full                                                             +---------+---------------+---------+-----------+----------+--------------+  PFV       Full                                                             +---------+---------------+---------+-----------+----------+--------------+  POP       Full            No        Yes                    Pulsatile flow  +---------+---------------+---------+-----------+----------+--------------+  PTV       Full                                                             +---------+---------------+---------+-----------+----------+--------------+  PERO      Full                                                             +---------+---------------+---------+-----------+----------+--------------+   +---------+---------------+---------+-----------+----------+--------------+  LEFT      Compressibility Phasicity Spontaneity Properties Summary         +---------+---------------+---------+-----------+----------+--------------+  CFV       Full            No        Yes                    Pulsatile flow  +---------+---------------+---------+-----------+----------+--------------+  SFJ       Full                                                             +---------+---------------+---------+-----------+----------+--------------+  FV Prox   Full                                                             +---------+---------------+---------+-----------+----------+--------------+  FV Mid    Full                                                             +---------+---------------+---------+-----------+----------+--------------+  FV Distal Full                                                             +---------+---------------+---------+-----------+----------+--------------+  PFV       Full                                                              +---------+---------------+---------+-----------+----------+--------------+  POP       Full            No        Yes                    Pulsatile flow  +---------+---------------+---------+-----------+----------+--------------+  PTV       Full                                                             +---------+---------------+---------+-----------+----------+--------------+  PERO      Full                                                             +---------+---------------+---------+-----------+----------+--------------+     Summary: Right: There is no evidence of deep vein thrombosis in the lower extremity. No cystic structure found in the popliteal fossa. Left: There is no evidence of deep vein thrombosis in the lower extremity. No cystic structure found in the popliteal fossa.  Bilateral lower extremity veins exhibit pulsatile flow, suggestive of elevated right-sided heart pressure. *See table(s) above for measurements and observations. Electronically signed by Harold Barban MD on 02/05/2019 at 1:02:23 PM.    Final     Resolved Hospital Problem list   Septic Shock   Active Problem list    Acute Hypoxemic Respiratory Failure secondary to MSSA Bacteremia, Septic Shock in the setting of IVDA P: Continue PRVC as rest mode  Reviewed vent wean strategy with RT, trial PSV and possible ATC if tolerates Wean PEEP/FiO2 for sats >90% Follow intermittent CXR Begin in-line speaking valve trial Plan to remove trach sutures ~ 4/25   Infective Endocarditis in setting of MSSA Bacteremia   -abx started 4/8 Severe Tricuspid Regurgitation  Acute Diastolic CHF  P: Ancef for 6 weeks  Follow fever curve / WBC trend  LCB  AKI on CKD  Hypernatremia  Hypokalemia -not an HD candidate  P: Free water PT  Trend BMP / urinary output Replace electrolytes as indicated Avoid nephrotoxic agents, ensure adequate renal perfusion  Severe Protein Calorie Malnutrition  P: TF per Nutrition   Anemia  -of  critical illness Thrombocytopenia  P: Trend CBC Transfuse per ICU guidelines  Intermittent Agitation  -CT head negative -MRI brain / spine deferred  P:   Fentanyl patch 50 mcg > plan for slow reduction  PRN dilaudid    Best practice:  Diet: TF Pain/Anxiety/Delirium protocol (if indicated): precedex VAP protocol (if indicated): in place DVT prophylaxis: scd GI prophylaxis: PPI Glucose control: protocal Mobility: as tolerated Code Status: Limited code Family Communication: Update per primary MD.  Disposition: ICU   CC Time: 54 minutes     Noe Gens, NP-C [facility] Pgr: [phone] or if no answer [phone] 02/06/2019, 12:04 PM

## 2019-02-06 NOTE — Progress Notes (Signed)
Assisted tele visit to patient with mother.  Cleotis Nipper, RN

## 2019-02-06 NOTE — Progress Notes (Signed)
PROGRESS NOTE [facility]   Molly [name]   XFG:[id number] DOB: 09-Jun-1986  DOA: 01/20/2019 PCP: Patient, No Pcp Per   Brief Narrative:  Molly [name] 33 year old female who presented to the emergency department with altered mental status.  Upon ED evaluation she was found to be hypotensive, tachycardic with lower extremity petechia sign ecchymosis.  Chest x-ray showed bilateral multiple infiltrates.  Lab work-up showed creatinine of 7.2 with elevated liver enzymes and platelets of 21.  Patient was admitted to the hospital with working diagnosis of sepsis with multiorgan failure and possible DIC.  Patient condition deteriorated and required invasive mechanical ventilation.  CT of the chest was done which was positive for septic emboli and right base pneumonia.  Blood cultures grew MSSA.  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  Patient has history of polysubstance/IV drug abuse.  Patient has been on antibiotic therapy with cefazolin.  Underwent tracheostomy on April 17, which was complicated by bleeding.  Subjective: Patient seen and examined, she is awake and alert.  Able to follow commands.  Still requiring ventilatory support through tracheostomy.  Mild signs of fluid overload appreciated on exam.  Express complaints of nasal congestion and is having positive thrush.  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 -Continue IV antibiotic therapy with cefazolin, end date May 19.  -Echocardiogram shows moderate to severe tricuspid valve regurgitation.  -Patient currently with mild signs of fluid overload; but achieving euvolemic state. -Patient is not candidate for surgical intervention at this time.  Positive septic emboli to bilateral lungs.   -Continue to monitor  -Appreciate assistance and recommendations by critical care service  Acute respiratory failure, status  post tracheostomy -Patient tolerating PSB, she had postop bleed with tracheostomy that require PRBC transfusion.  No signs of DIC.   -D-dimer elevated due to infectious process and septic emboli to the lungs.  -Lower extremity Doppler negative for DVT.  AKI on CKD stage IV with hypernatremia and hypokalemia. -Renal function is plateauing, continue free water.   -Electrolyte correction as needed.   -Continue monitoring renal function trend intermittently. -Avoid hypotension and nephrotoxic agents -Adjust medication as per renal function.   Pancytopenia - stable -Likely bone marrow suppression from sepsis, planning for blood transfusion if hemoglobin less than 7. -Continue monitoring hemoglobin trend platelets count.   -No overt bleeding appreciated.   Severe calorie protein malnutrition -will continue nutritional supplement as per dietitian.  Polysubstance abuse -Cessation counseling provided and encouraged.  Ritta Slot -will treat with fluconazole -once NGT out, if needed will add nystatin.    DVT prophylaxis: SCDs Code Status: Partial Family Communication: None at bedside Disposition Plan: Pending clinical improvement, wean vent as able, likely d/c to SNF for IV abx therapy.   Consultants:   PCCM  Procedures:   Trach 4/17  Antimicrobials:  Cefazolin   Objective: Vitals:   02/06/19 0800 02/06/19 0900 02/06/19 1106 02/06/19 1122  BP: 111/84 117/86    Pulse: 70 77  87  Resp: _0 Temp: 98.5 F (36.9 C)  98.6 F (37 C)   TempSrc: Oral  Oral   SpO2: 100% 100%  100%  Weight:      Height:        Intake/Output Summary (Last 24 hours) at 02/06/2019 1146 Last data filed at 02/06/2019 1100 Gross per 24 hour  Intake 1551.87 ml  Output 1125 ml  Net 426.87 ml   Filed Weights   02/04/19 0500  02/05/19 0500 02/06/19 0500  Weight: 55.5 kg 55.9 kg 55.3 kg   Examination: General exam: Alert, awake, oriented x 3, still requiring ventilatory support through  tracheostomy, afebrile, with signs of fluid overload on exam.  Denies chest pain, nausea, vomiting or abdominal pain.  Positive thrush. Respiratory system: Positive bibasilar crackles, diffuse rhonchi.  No using accessory muscles. Cardiovascular system: RRR. No rubs, no gallops. Holosystolic murmur appreciated on exam.  Gastrointestinal system: Abdomen is mildly distended, soft and nontender. Normal bowel sounds heard. Central nervous system: Alert and oriented. No focal neurological deficits. Extremities: No cyanosis or clubbing.  Trace-1+ edema bilaterally lower extremity appreciated. Skin: No rashes, no petechiae  Data Reviewed: I have personally reviewed following labs and imaging studies  CBC: Recent Labs  Lab 01/31/19 0411  02/03/19 0232 02/03/19 1901 02/04/19 0424 02/05/19 0418 02/06/19 0253  WBC 3.5*   < > 1.9* 2.0* 2.3* 2.4* 2.9*  NEUTROABS 2.9  --   --  1.3*  --   --  2.2  HGB 7.7*   < > 6.2* 7.3* 7.5* 7.8* 8.3*  HCT 24.3*   < > 20.8* 24.0* 26.1* 26.3* 27.2*  MCV 88.0   < > 94.1 93.0 92.9 92.0 93.8  PLT 78*   < > 90* 82* 88* 75* 95*   < > = values in this interval not displayed.   Basic Metabolic Panel: Recent Labs  Lab 01/31/19 0411  02/02/19 0453 02/03/19 0232 02/04/19 0424 02/05/19 0418 02/06/19 0253  NA  --    < > 149* 152* 150* 148* 149*  K  --    < > 3.8 3.2* 3.6 3.4* 3.3*  CL  --    < > 108 109 108 109 110  CO2  --    < > _0 GLUCOSE  --    < > 150* 120* 117* 101* 125*  BUN  --    < > 135* 119* 113* 116* 118*  CREATININE  --    < > 2.86* 2.45* 2.37* 2.40* 2.54*  CALCIUM  --    < > 7.4* 7.8* 7.8* 7.5* 7.6*  MG 2.2  --   --   --   --  1.9 2.0  PHOS 7.7*  --   --   --   --   --   --    < > = values in this interval not displayed.   GFR: Estimated Creatinine Clearance: 24.2 mL/min (A) (by C-G formula based on SCr of 2.54 mg/dL (H)).   Liver Function Tests: Recent Labs  Lab 02/01/19 0508 02/02/19 0453 02/03/19 0232 02/04/19 0424  02/05/19 0418  AST _1 14*  ALT <5 <5 5 <5 <5  ALKPHOS 117 100 87 85 76  BILITOT 0.5 0.3 0.5 0.7 0.5  PROT 7.7 7.3 7.2 7.2 7.3  ALBUMIN 1.1* 1.1* 1.4* 1.3* 1.3*   Coagulation Profile: Recent Labs  Lab 02/02/19 0950 02/03/19 1901  INR 1.7* 1.5*   CBG: Recent Labs  Lab 02/05/19 1958 02/06/19 0042 02/06/19 0438 02/06/19 0734 02/06/19 1109  GLUCAP 111* 108* 108* 125* 113*   Lipid Profile: Recent Labs    02/04/19 0424  TRIG 56   Radiology Studies: Dg Abd 1 View  Result Date: 02/04/2019 CLINICAL DATA:  Abdominal pain with abdominal distention. EXAM: ABDOMEN - 1 VIEW COMPARISON:  01/21/2019 FINDINGS: Bibasilar atelectasis or infiltrate noted with left pleural effusion. Upper abdomen is largely gasless which may be related to positioning  although hepato splenomegaly be a consideration. Feeding tube visualized with distal tip at the ligament of Treitz. No gaseous small bowel dilatation. IMPRESSION: No gaseous small bowel dilatation to suggest small bowel obstruction. Colon appears largely gasless. Electronically Signed   By: Misty Stanley M.D.   On: 02/04/2019 15:11   Ir Fluoro Guide Cv Line Right  Result Date: 02/05/2019 CLINICAL DATA:  Sepsis, endocarditis and need for long-term IV antibiotics. EXAM: TUNNELED CENTRAL VENOUS CATHETER PLACEMENT WITH ULTRASOUND AND FLUOROSCOPIC GUIDANCE ANESTHESIA/SEDATION: None MEDICATIONS: None FLUOROSCOPY TIME:  12 seconds.  3.2 mGy. PROCEDURE: The procedure, risks, benefits, and alternatives were explained to the patient's mother. Questions regarding the procedure were encouraged and answered. The patient's mother understands and consents to the procedure. A timeout was performed prior to initiating the procedure. The right neck and chest were prepped with chlorhexidine in a sterile fashion, and a sterile drape was applied covering the operative field. Maximum barrier sterile technique with sterile gowns and gloves were used for the  procedure. Local anesthesia was provided with 1% lidocaine. Ultrasound was used to confirm patency of the right internal jugular vein. After creating a small venotomy incision, a 21 gauge needle was advanced into the right internal jugular vein under direct, real-time ultrasound guidance. Ultrasound image documentation was performed. After securing guidewire access, a 6 French peel-away sheath was placed. A wire was kinked to measure appropriate catheter length. A 6 Fr, dual lumen Power line tunneled central venous catheter was chosen for placement. This was tunneled in a retrograde fashion from the chest wall to the venotomy incision. The catheter was cut to 21 cm based on guidewire measurement. The catheter was then placed through the peel-away sheath and the sheath removed. Final catheter positioning was confirmed and documented with a fluoroscopic spot image. The catheter was aspirated and flushed with saline. The venotomy incision was closed with subcutaneous 4-0 Vicryl. Dermabond was applied to the incision. The catheter exit site was secured with Prolene retention sutures. COMPLICATIONS: None.  No pneumothorax. FINDINGS: After catheter placement, the tip lies at the SVC/RA junction. The catheter aspirates normally and is ready for immediate use. IMPRESSION: Placement of tunneled central venous catheter via the right internal jugular vein. The catheter tip lies at the SVC/RA junction. The catheter is ready for immediate use. Electronically Signed   By: Aletta Edouard M.D.   On: 02/05/2019 16:55   Ir US Guide Vasc Access Right  Result Date: 02/05/2019 CLINICAL DATA:  Sepsis, endocarditis and need for long-term IV antibiotics. EXAM: TUNNELED CENTRAL VENOUS CATHETER PLACEMENT WITH ULTRASOUND AND FLUOROSCOPIC GUIDANCE ANESTHESIA/SEDATION: None MEDICATIONS: None FLUOROSCOPY TIME:  12 seconds.  3.2 mGy. PROCEDURE: The procedure, risks, benefits, and alternatives were explained to the patient's mother. Questions  regarding the procedure were encouraged and answered. The patient's mother understands and consents to the procedure. A timeout was performed prior to initiating the procedure. The right neck and chest were prepped with chlorhexidine in a sterile fashion, and a sterile drape was applied covering the operative field. Maximum barrier sterile technique with sterile gowns and gloves were used for the procedure. Local anesthesia was provided with 1% lidocaine. Ultrasound was used to confirm patency of the right internal jugular vein. After creating a small venotomy incision, a 21 gauge needle was advanced into the right internal jugular vein under direct, real-time ultrasound guidance. Ultrasound image documentation was performed. After securing guidewire access, a 6 French peel-away sheath was placed. A wire was kinked to measure appropriate catheter length. A 6  Fr, dual lumen Power line tunneled central venous catheter was chosen for placement. This was tunneled in a retrograde fashion from the chest wall to the venotomy incision. The catheter was cut to 21 cm based on guidewire measurement. The catheter was then placed through the peel-away sheath and the sheath removed. Final catheter positioning was confirmed and documented with a fluoroscopic spot image. The catheter was aspirated and flushed with saline. The venotomy incision was closed with subcutaneous 4-0 Vicryl. Dermabond was applied to the incision. The catheter exit site was secured with Prolene retention sutures. COMPLICATIONS: None.  No pneumothorax. FINDINGS: After catheter placement, the tip lies at the SVC/RA junction. The catheter aspirates normally and is ready for immediate use. IMPRESSION: Placement of tunneled central venous catheter via the right internal jugular vein. The catheter tip lies at the SVC/RA junction. The catheter is ready for immediate use. Electronically Signed   By: Aletta Edouard M.D.   On: 02/05/2019 16:55   Vas [place] Lower  Extremity Venous (dvt)  Result Date: 02/05/2019  Lower Venous Study Indications: Swelling, and Polysubstance abuse. IV drug abuse. Septic pulmonary emboli.  Performing Technologist: Maudry Mayhew RDMS, RVT, RDCS  Examination Guidelines: A complete evaluation includes B-mode imaging, spectral Doppler, color Doppler, and power Doppler as needed of all accessible portions of each vessel. Bilateral testing is considered an integral part of a complete examination. Limited examinations for reoccurring indications may be performed as noted.  +---------+---------------+---------+-----------+----------+--------------+  RIGHT     Compressibility Phasicity Spontaneity Properties Summary         +---------+---------------+---------+-----------+----------+--------------+  CFV       Full            No        Yes                    Pulsatile flow  +---------+---------------+---------+-----------+----------+--------------+  SFJ       Full                                                             +---------+---------------+---------+-----------+----------+--------------+  FV Prox   Full                                                             +---------+---------------+---------+-----------+----------+--------------+  FV Mid    Full                                                             +---------+---------------+---------+-----------+----------+--------------+  FV Distal Full                                                             +---------+---------------+---------+-----------+----------+--------------+  PFV       Full                                                             +---------+---------------+---------+-----------+----------+--------------+  POP       Full            No        Yes                    Pulsatile flow  +---------+---------------+---------+-----------+----------+--------------+  PTV       Full                                                              +---------+---------------+---------+-----------+----------+--------------+  PERO      Full                                                             +---------+---------------+---------+-----------+----------+--------------+   +---------+---------------+---------+-----------+----------+--------------+  LEFT      Compressibility Phasicity Spontaneity Properties Summary         +---------+---------------+---------+-----------+----------+--------------+  CFV       Full            No        Yes                    Pulsatile flow  +---------+---------------+---------+-----------+----------+--------------+  SFJ       Full                                                             +---------+---------------+---------+-----------+----------+--------------+  FV Prox   Full                                                             +---------+---------------+---------+-----------+----------+--------------+  FV Mid    Full                                                             +---------+---------------+---------+-----------+----------+--------------+  FV Distal Full                                                             +---------+---------------+---------+-----------+----------+--------------+  PFV       Full                                                             +---------+---------------+---------+-----------+----------+--------------+  POP       Full            No        Yes                    Pulsatile flow  +---------+---------------+---------+-----------+----------+--------------+  PTV       Full                                                             +---------+---------------+---------+-----------+----------+--------------+  PERO      Full                                                             +---------+---------------+---------+-----------+----------+--------------+     Summary: Right: There is no evidence of deep vein thrombosis in the lower extremity. No cystic structure found in the  popliteal fossa. Left: There is no evidence of deep vein thrombosis in the lower extremity. No cystic structure found in the popliteal fossa.  Bilateral lower extremity veins exhibit pulsatile flow, suggestive of elevated right-sided heart pressure. *See table(s) above for measurements and observations. Electronically signed by Harold Barban MD on 02/05/2019 at 1:02:23 PM.    Final     Scheduled Meds:  acetaminophen  650 mg Per Tube Q6H   chlorhexidine gluconate (MEDLINE KIT)  15 mL Mouth Rinse BID   Chlorhexidine Gluconate Cloth  6 each Topical Q0600   Chlorhexidine Gluconate Cloth  6 each Topical Daily   feeding supplement (PRO-STAT SUGAR FREE 64)  30 mL Oral Daily   fentaNYL  1 patch Transdermal Q72H   fluconazole  100 mg Oral Daily   free water  350 mL Per Tube Q8H   furosemide  60 mg Intravenous Daily   heparin injection (subcutaneous)  5,000 Units Subcutaneous Q8H   mouth rinse  15 mL Mouth Rinse 10 times per day   pantoprazole sodium  40 mg Per Tube Daily   sodium chloride flush  10-40 mL Intracatheter Q12H   Thrombi-Pad  1 each Topical Once   vitamin C  250 mg Per NG tube BID   Continuous Infusions:  sodium chloride 1,000 mL (02/05/19 0912)    ceFAZolin (ANCEF) IV 2 g (02/06/19 1051)   feeding supplement (OSMOLITE 1.5 CAL) 40 mL/hr at 02/05/19 1700     LOS: 17 days   Time spent: 35 minutes With patient, greater than 50% of which was dedicated to face-to-face examination, discussion of treatment, plan of care, counseling and consultations with a specialist.    Barton Dubois, MD [phone]  How to contact the [facility] Attending or Consulting provider [place] or covering provider during after hours [place], for this patient?  1. Check the care team in [facility] and look for a) attending/consulting [facility] provider listed and b) the [facility] team listed 2. Log into [email or web address] and use [facility] universal password to access. If you do not have the password, please contact the  hospital operator. 3. Locate the [facility] provider you are looking for under [facility] and page to a number that you can be directly reached.  4. If you still have difficulty reaching the provider, please page the [facility] (Director on Call) for the Hospitalists listed on amion for assistance.

## 2019-02-06 NOTE — Progress Notes (Addendum)
Nutrition Follow-up   RD working remotely.  DOCUMENTATION CODES:   Not applicable  INTERVENTION:   Tube Feeding:   Transition to bolus feedings after gastric positioning confirmed Osmolite 1.5 237 mL (1 can) 4 times daily Pro-Stat 30 mL BID Provides 1620 kcals, 90 g of protein, 724 mL of free water Free water flush of 60 mL before and after each bolus: total free water 1204 mL   NUTRITION DIAGNOSIS:   Inadequate oral intake related to inability to eat(pt sedated and ventilated ) as evidenced by NPO status.  Being addressed via TF   GOAL:   Provide needs based on ASPEN/SCCM guidelines  Progressing  MONITOR:   Vent status, Labs, Weight trends, I & O's, Skin  REASON FOR ASSESSMENT:   Ventilator    ASSESSMENT:    33 y/o female with h/o IVDU/polysubstance abuse and MRSA bacteremia/TV endocarditis/spinal osteomyelitis/septic pulmonary emboli (left AMA July 2019) who presented to the ED with AMS found to be septic with hypothermia, hypotension, and hypoxia.   4/17 Trach  Patient remains on ventilator support via trach, daily PSV wean MV: 6.2 L/min Temp (24hrs), Avg:98.6 F (37 C), Min:98.3 F (36.8 C), Max:98.8 F (37.1 C)  TF held 4/19 for abdominal pain, distention. Abdominal xray with Cortrak tube at ligament of treitz. Non-obstructive bowel gas pattern Abdominal pain improving following BM   TF resumed 4/20 at 40 ml/hr Considered switching patient to bolus feedings at this time but tube tip is at the LOT; tube position not appropriate for bolus feedings. Tube tip would need to be pulled back to gastric position in order to transition to bolus feedings Spoke with CCM MD and received verbal order to have Cortrak tube pulled back to gastric position. Discussed this with RN, provided directions for unclipping bridle and re-positioning tube and re-clipping on place. Abd xray to be obtained to confirm gastric placement  Hypernatremic, free water 350 mL q 8  hours  Labs: sodium 149 (H), potassium 3.3, BUN 118, Creatinine 2.54 Meds: Vitamin C 250 mg BID, KCL, lasix,   Diet Order:   Diet Order    None      EDUCATION NEEDS:   Not appropriate for education at this time  Skin:  Skin Assessment: Reviewed RN Assessment(no pressure injuries noted)  Last BM:  4/21  Height:   Ht Readings from Last 1 Encounters:  01/20/19 5\' 1"  (1.549 m)    Weight:   Wt Readings from Last 1 Encounters:  02/06/19 55.3 kg    Ideal Body Weight:  47.7 kg  BMI:  Body mass index is 23.04 kg/m.  Estimated Nutritional Needs:   Kcal:  1560 kcals   Protein:  70-90 g   Fluid:  >/= 1.5 L   Kerman Passey MS, RD, LDN, CNSC [phone] Pager  [phone] Weekend/On-Call Pager

## 2019-02-07 LAB — CBC
HCT: 27.1 % — ABNORMAL LOW (ref 36.0–46.0)
Hemoglobin: 8.1 g/dL — ABNORMAL LOW (ref 12.0–15.0)
MCH: 27.8 pg (ref 26.0–34.0)
MCHC: 29.9 g/dL — ABNORMAL LOW (ref 30.0–36.0)
MCV: 93.1 fL (ref 80.0–100.0)
Platelets: 89 10*3/uL — ABNORMAL LOW (ref 150–400)
RBC: 2.91 MIL/uL — ABNORMAL LOW (ref 3.87–5.11)
RDW: 15.2 % (ref 11.5–15.5)
WBC: 3 10*3/uL — ABNORMAL LOW (ref 4.0–10.5)
nRBC: 0 % (ref 0.0–0.2)

## 2019-02-07 LAB — C DIFFICILE QUICK SCREEN W PCR REFLEX: C Diff toxin: NEGATIVE

## 2019-02-07 LAB — BASIC METABOLIC PANEL
Anion gap: 11 (ref 5–15)
BUN: 108 mg/dL — ABNORMAL HIGH (ref 6–20)
CO2: 27 mmol/L (ref 22–32)
Calcium: 7.6 mg/dL — ABNORMAL LOW (ref 8.9–10.3)
Chloride: 109 mmol/L (ref 98–111)
Creatinine, Ser: 2.23 mg/dL — ABNORMAL HIGH (ref 0.44–1.00)
GFR calc Af Amer: 33 mL/min — ABNORMAL LOW (ref >60)
GFR calc non Af Amer: 28 mL/min — ABNORMAL LOW (ref >60)
Glucose, Bld: 111 mg/dL — ABNORMAL HIGH (ref 70–99)
Potassium: 3 mmol/L — ABNORMAL LOW (ref 3.5–5.1)
Sodium: 147 mmol/L — ABNORMAL HIGH (ref 135–145)

## 2019-02-07 LAB — GLUCOSE, CAPILLARY
Glucose-Capillary: 115 mg/dL — ABNORMAL HIGH (ref 70–99)
Glucose-Capillary: 116 mg/dL — ABNORMAL HIGH (ref 70–99)
Glucose-Capillary: 122 mg/dL — ABNORMAL HIGH (ref 70–99)
Glucose-Capillary: 140 mg/dL — ABNORMAL HIGH (ref 70–99)
Glucose-Capillary: 89 mg/dL (ref 70–99)
Glucose-Capillary: 90 mg/dL (ref 70–99)

## 2019-02-07 LAB — MAGNESIUM: Magnesium: 1.8 mg/dL (ref 1.7–2.4)

## 2019-02-07 LAB — CLOSTRIDIUM DIFFICILE BY PCR, REFLEXED: Toxigenic C. Difficile by PCR: POSITIVE — AB

## 2019-02-07 LAB — C DIFFICILE QUICK SCREEN W PCR REFLEX??: C Diff antigen: POSITIVE — AB

## 2019-02-07 LAB — POTASSIUM: Potassium: 3.9 mmol/L (ref 3.5–5.1)

## 2019-02-07 MED ORDER — HYDROMORPHONE HCL 2 MG PO TABS
1.0000 mg | ORAL_TABLET | ORAL | Status: DC | PRN
  Administered 2019-02-07 – 2019-02-10 (×14): 1 mg via ORAL
  Filled 2019-02-07 (×14): qty 1

## 2019-02-07 MED ORDER — POTASSIUM CHLORIDE 20 MEQ/15ML (10%) PO SOLN
40.0000 meq | Freq: Once | ORAL | Status: AC
  Administered 2019-02-07: 40 meq
  Filled 2019-02-07: qty 30

## 2019-02-07 MED ORDER — MELATONIN 3 MG PO TABS
3.0000 mg | ORAL_TABLET | Freq: Every day | ORAL | Status: DC
  Administered 2019-02-07 – 2019-02-22 (×13): 3 mg via ORAL
  Filled 2019-02-07 (×17): qty 1

## 2019-02-07 NOTE — Evaluation (Signed)
Clinical/Bedside Swallow Evaluation Patient Details  Name: Molly [name] MRN: [id number] Date of Birth: 1986/02/28  Today's Date: 02/07/2019 Time: SLP Start Time (ACUTE ONLY): 1215 SLP Stop Time (ACUTE ONLY): 1235 SLP Time Calculation (min) (ACUTE ONLY): 20 min  Past Medical History:  Past Medical History:  Diagnosis Date  . 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 Abscess of skin    buttock - most recently 2009/10  . CKD (chronic kidney disease) stage 3, GFR 30-59 ml/min (HCC)   . Drug-seeking behavior   . Hypersplenism   . IV drug user   . MRSA (methicillin resistant Staphylococcus aureus) infection   . MRSA bacteremia   . Pancytopenia ([place])   . 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 Septic pulmonary embolism ([place])   . Tobacco abuse    Past Surgical History:  Past Surgical History:  Procedure Laterality Date  . IR FLUORO GUIDE CV LINE RIGHT  02/05/2019  . IR GENERIC HISTORICAL  07/19/2016   IR LUMBAR DISC ASPIRATION W/IMG GUIDE 07/19/2016 Arne Cleveland, MD [facility]-INTERV RAD  . IR US GUIDE VASC ACCESS RIGHT  02/05/2019  . RADIOLOGY WITH ANESTHESIA N/A 04/11/2018   Procedure: MRI OF LUMBAR AND THORACIC SPINE WITHOUT CONTRAST WITH ANESTHESIA;  Surgeon: Radiologist, Medication, MD;  Location: [facility];  Service: Radiology;  Laterality: N/A;  . TEE WITHOUT CARDIOVERSION N/A 07/04/2013   Procedure: TRANSESOPHAGEAL ECHOCARDIOGRAM (TEE);  Surgeon: Larey Dresser, MD;  Location: [facility] ENDOSCOPY;  Service: Cardiovascular;  Laterality: N/A;  . TONSILLECTOMY     33 years old   HPI:  Molly [name] 33 year old female who presented to the emergency department with altered mental status.  Patient was admitted to the hospital with working diagnosis of sepsis with multiorgan failure and possible DIC.  Patient condition deteriorated and required invasive mechanical ventilation 4/4.  CT of the chest was done which was positive for septic emboli and right base pneumonia.  Blood cultures grew  MSSA.  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  Patient has history of polysubstance/IV drug abuse.  Patient has been on antibiotic therapy with cefazolin.  Underwent tracheostomy on 6/20, which was complicated by bleeding.   Assessment / Plan / Recommendation Clinical Impression  Pt demonstrates potential for PO intake despite poor PMSV tolerance. Oral motor function WNL. Oral manipulation adequate, swallow response with ice and a teaspoon of liquid promising subjectively for potential to swallow. Recommend pt proceed with instrumental assessment next date for potential diet initiation. Trach downsize to 4 cuffed still suggested, see prior note.  SLP Visit Diagnosis: Dysphagia, unspecified (R13.10)    Aspiration Risk  Moderate aspiration risk    Diet Recommendation Ice chips PRN after oral care        Other  Recommendations     Follow up Recommendations Inpatient Rehab      Frequency and Duration min 2x/week          Prognosis        Swallow Study   General HPI: Molly [name] 33 year old female who presented to the emergency department with altered mental status.  Patient was admitted to the hospital with working diagnosis of sepsis with multiorgan failure and possible DIC.  Patient condition deteriorated and required invasive mechanical ventilation 4/4.  CT of the chest was done which was positive for septic emboli and right base pneumonia.  Blood cultures grew MSSA.  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  Patient has history of polysubstance/IV drug abuse.  Patient  has been on antibiotic therapy with cefazolin.  Underwent tracheostomy on 0/37, which was complicated by bleeding. Type of Study: Bedside Swallow Evaluation Previous Swallow Assessment: none Diet Prior to this Study: NPO;NG Tube Temperature Spikes Noted: No Respiratory Status: Trach Collar History of Recent Intubation: Yes Length of Intubations (days): 14  days Date extubated: 01/30/19 Behavior/Cognition: Alert;Cooperative Oral Cavity Assessment: Within Functional Limits Oral Care Completed by SLP: Yes Oral Cavity - Dentition: Adequate natural dentition Vision: Functional for self-feeding Self-Feeding Abilities: Able to feed self;Total assist Patient Positioning: Upright in bed Baseline Vocal Quality: Not observed Volitional Cough: Weak    Oral/Motor/Sensory Function Overall Oral Motor/Sensory Function: Within functional limits   Ice Chips Ice chips: Within functional limits Presentation: Spoon   Thin Liquid Thin Liquid: Within functional limits Presentation: Spoon    Nectar Thick Nectar Thick Liquid: Not tested   Honey Thick Honey Thick Liquid: Not tested   Puree Puree: Not tested   Solid     Solid: Not tested     Herbie Baltimore, MA CCC-SLP  Acute Rehabilitation Services Pager [phone] Office [phone]  Slade Pierpoint, Katherene Ponto 02/07/2019,1:53 PM

## 2019-02-07 NOTE — Progress Notes (Signed)
Pt was on trach collar over night 28%/5L. Pt requested to be placed back on vent because she was getting SOB, RR in the high 40s. Placed pt back on previous vent support. [name] vitals are stable at this time. RT will continue to monitor.

## 2019-02-07 NOTE — Progress Notes (Addendum)
Received call from patient's bedside nurse that patient's mom had questions about filing for Medicaid stating that she had already been denied 3 months ago and now needs to file an appeal. LVM for financial counselor who has previously spoken with patient's mom about Medicaid status/process. Noted in financial counselor notes that they are monitoring account for disability given patient's recent hospitalization.   Attempted to contact patient's mom, Foye Deer, at [phone] to advise of pending f/u with financial counselor. Unidentified voicemail. No message left.   Manya Silvas, RN CM  Transitions of Care [address] [place]

## 2019-02-07 NOTE — Progress Notes (Signed)
NAME:  Molly [name], MRN:  [id number], DOB:  07-24-1986, LOS: 61 ADMISSION DATE:  01/20/2019, CONSULTATION DATE: 01/20/2019 REFERRING MD:  Bonner Puna, CHIEF COMPLAINT:  Sepsis  Brief History   33 y/o F with a history of IV drug abuse/polysubstance abuse (presented with urine drug screen positive for opiates and amphetamines) and a previous history in June or July 2019 of inadequately treated MRSA endocarditis having signed out AMA at that time.  She had a tricuspid vegetation at that time.  Admitted 4/4 hypotensive and in respiratory distress requiring intubation at [facility] long.   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 Awake / FC. Looks best in many days. Failed SBT 4/12 Failed SBT.  AKI improving, tx PRBC.  Less alert on precedex, methadone  4/13 More alert, FC. On precedex. Failed SBT. ID recommending PICC for antibiotcs > mother not sure about goals of care. S/p 1 unit PRBC   4/17 Trach placement 4/21 tolerating trach collar trials.  Consults:  Nephrology  Procedures:  ETT 4/4 >> 4/17 R Femoral line 4/4 >> 4/8 R IJ Tunneled CVC (IR) 4/21 >>  Trach 4/17   Significant Diagnostic Tests:  Echocardiogram 4/4 >> TV vegetation measures 1.5 mm x 0.6 mm., severe TR CT Chest 4/4 >> bilateral pulmonary nodules, peripheral and cavitated.   Micro Data:  Blood cultures x2 4/4 >> MSSA  BCx2 4/8 >> negative     Antimicrobials:  Vancomycin Zosyn Teflaro >> stopped Ancef 4/4 >> Diflucan 4/18 >> 4/21  Interim history/subjective:  Tired and short of breath having been on TCT overnight. Poor sleep. 3 episodes of diarrhea.  Objective   Blood pressure (!) 140/113, pulse 75, temperature 99.1 F (37.3 C), resp. rate (!) 36, height 5\' 1"  (1.549 m), weight 55.3 kg, SpO2 100 %.    Vent Mode: PRVC FiO2 (%):  [28 %-40 %] 28 % Set Rate:  [15 bmp] 15 bmp Vt Set:  [450 mL] 450 mL PEEP:  [5 cmH20] 5 cmH20 Plateau Pressure:  [19 cmH20] 19 cmH20   Intake/Output Summary (Last 24 hours) at 02/07/2019 1429 Last data filed at 02/07/2019 1400 Gross per 24 hour  Intake 1160 ml  Output 2131 ml  Net -971 ml   Filed Weights   02/05/19 0500 02/06/19 0500 02/07/19 0500  Weight: 55.9 kg 55.3 kg 55.3 kg   General: chronically ill appearing female sitting on edge of bed. HEENT: MM pink/moist, trach midline c/d/i Neuro: Awake, alert, communicating appropriately, MAE CV: s1s2 rrr, no m/r/g PULM: even/non-labored, lungs bilaterally clear  QZ:ESPQ, non-tender, bsx4 active  Extremities: warm/dry, no edema Skin: small abrasions/lesions on toes (? From sepsis vs prior pressors) improving from past exam   LABS    PULMONARY No results for input(s): PHART, PCO2ART, PO2ART, HCO3, TCO2, O2SAT in the last 168 hours.  Invalid input(s): PCO2, PO2  CBC Recent Labs  Lab 02/05/19 0418 02/06/19 0253 02/07/19 0332  HGB 7.8* 8.3* 8.1*  HCT 26.3* 27.2* 27.1*  WBC 2.4* 2.9* 3.0*  PLT 75* 95* 89*    COAGULATION Recent Labs  Lab 02/02/19 0950 02/03/19 1901  INR 1.7* 1.5*    CARDIAC  No results for input(s): TROPONINI in the last 168 hours. No results for input(s): PROBNP in the last 168 hours.   CHEMISTRY Recent Labs  Lab 02/03/19 0232 02/04/19 0424 02/05/19 0418 02/06/19 0253 02/07/19 0332 02/07/19 1236  NA 152* 150* 148* 149* 147*  --   K 3.2* 3.6 3.4* 3.3* 3.0* 3.9  CL 109 108 109 110 109  --    CO2 31 31 28 28 27   --   GLUCOSE 120* 117* 101* 125* 111*  --   BUN 119* 113* 116* 118* 108*  --   CREATININE 2.45* 2.37* 2.40* 2.54* 2.23*  --   CALCIUM 7.8* 7.8* 7.5* 7.6* 7.6*  --   MG  --   --  1.9 2.0  --  1.8   Estimated Creatinine Clearance: 27.6 mL/min (A) (by C-G formula based on SCr of 2.23 mg/dL (H)).   LIVER Recent Labs  Lab 02/01/19 0508 02/02/19 0453 02/02/19 0950 02/03/19 0232 02/03/19 1901 02/04/19 0424 02/05/19 0418  AST 19 22  --  20  --  17 14*  ALT <5 <5  --  5  --  <5 <5  ALKPHOS 117 100  --  87  --  85 76  BILITOT 0.5 0.3  --  0.5  --  0.7 0.5  PROT 7.7 7.3  --  7.2  --  7.2 7.3  ALBUMIN 1.1* 1.1*  --  1.4*  --  1.3* 1.3*  INR  --   --  1.7*  --  1.5*  --   --      INFECTIOUS No results for input(s): LATICACIDVEN, PROCALCITON in the last 168 hours.   ENDOCRINE CBG (last 3)  Recent Labs    02/07/19 0345 02/07/19 0734 02/07/19 1125  GLUCAP 89 122* 115*    IMAGING   Dg Abd 1 View  Result Date: 02/06/2019 CLINICAL DATA:  34 year old female with gastric tube placement EXAM: ABDOMEN - 1 VIEW COMPARISON:  02/04/2019 FINDINGS: Weighted tip enteric feeding tube terminates in the pyloric region of the stomach. Intrauterine device in place. Opacities at the lung bases, poorly characterized. No evidence of abnormally distended small bowel. IMPRESSION: Weighted tip enteric feeding tube terminates at the pyloric region of the stomach. Electronically Signed   By: Corrie Mckusick D.O.   On: 02/06/2019 14:11   Ir Fluoro Guide Cv Line Right  Result Date: 02/05/2019 CLINICAL DATA:  Sepsis, endocarditis and need for long-term IV antibiotics. EXAM: TUNNELED CENTRAL VENOUS CATHETER PLACEMENT WITH ULTRASOUND AND FLUOROSCOPIC GUIDANCE ANESTHESIA/SEDATION: None MEDICATIONS: None FLUOROSCOPY TIME:  12 seconds.  3.2 mGy. PROCEDURE: The procedure, risks, benefits, and alternatives were explained to the patient's mother. Questions regarding the procedure were encouraged  and answered. The patient's mother understands and consents to the procedure. A timeout was performed prior to initiating the procedure. The right neck and chest were prepped with chlorhexidine in a sterile fashion, and a sterile drape was applied covering the operative field. Maximum barrier sterile technique with sterile gowns and gloves were used for the procedure. Local anesthesia was provided with 1% lidocaine. Ultrasound was used to confirm patency of the right internal jugular vein. After creating a small venotomy incision, a 21 gauge needle was advanced into the right internal jugular vein under direct, real-time ultrasound guidance. Ultrasound image documentation was performed. After securing  guidewire access, a 6 French peel-away sheath was placed. A wire was kinked to measure appropriate catheter length. A 6 Fr, dual lumen Power line tunneled central venous catheter was chosen for placement. This was tunneled in a retrograde fashion from the chest wall to the venotomy incision. The catheter was cut to 21 cm based on guidewire measurement. The catheter was then placed through the peel-away sheath and the sheath removed. Final catheter positioning was confirmed and documented with a fluoroscopic spot image. The catheter was aspirated and flushed with saline. The venotomy incision was closed with subcutaneous 4-0 Vicryl. Dermabond was applied to the incision. The catheter exit site was secured with Prolene retention sutures. COMPLICATIONS: None.  No pneumothorax. FINDINGS: After catheter placement, the tip lies at the SVC/RA junction. The catheter aspirates normally and is ready for immediate use. IMPRESSION: Placement of tunneled central venous catheter via the right internal jugular vein. The catheter tip lies at the SVC/RA junction. The catheter is ready for immediate use. Electronically Signed   By: Aletta Edouard M.D.   On: 02/05/2019 16:55   Ir US Guide Vasc Access Right  Result Date:  02/05/2019 CLINICAL DATA:  Sepsis, endocarditis and need for long-term IV antibiotics. EXAM: TUNNELED CENTRAL VENOUS CATHETER PLACEMENT WITH ULTRASOUND AND FLUOROSCOPIC GUIDANCE ANESTHESIA/SEDATION: None MEDICATIONS: None FLUOROSCOPY TIME:  12 seconds.  3.2 mGy. PROCEDURE: The procedure, risks, benefits, and alternatives were explained to the patient's mother. Questions regarding the procedure were encouraged and answered. The patient's mother understands and consents to the procedure. A timeout was performed prior to initiating the procedure. The right neck and chest were prepped with chlorhexidine in a sterile fashion, and a sterile drape was applied covering the operative field. Maximum barrier sterile technique with sterile gowns and gloves were used for the procedure. Local anesthesia was provided with 1% lidocaine. Ultrasound was used to confirm patency of the right internal jugular vein. After creating a small venotomy incision, a 21 gauge needle was advanced into the right internal jugular vein under direct, real-time ultrasound guidance. Ultrasound image documentation was performed. After securing guidewire access, a 6 French peel-away sheath was placed. A wire was kinked to measure appropriate catheter length. A 6 Fr, dual lumen Power line tunneled central venous catheter was chosen for placement. This was tunneled in a retrograde fashion from the chest wall to the venotomy incision. The catheter was cut to 21 cm based on guidewire measurement. The catheter was then placed through the peel-away sheath and the sheath removed. Final catheter positioning was confirmed and documented with a fluoroscopic spot image. The catheter was aspirated and flushed with saline. The venotomy incision was closed with subcutaneous 4-0 Vicryl. Dermabond was applied to the incision. The catheter exit site was secured with Prolene retention sutures. COMPLICATIONS: None.  No pneumothorax. FINDINGS: After catheter placement, the  tip lies at the SVC/RA junction. The catheter aspirates normally and is ready for immediate use. IMPRESSION: Placement of tunneled central venous catheter via the right internal jugular vein. The catheter tip lies at the SVC/RA junction. The catheter is ready for immediate use. Electronically Signed   By: Aletta Edouard M.D.   On: 02/05/2019 16:55    Resolved Hospital Problem list   Septic Shock   Active Problem list    Acute Hypoxemic Respiratory Failure secondary to MSSA Bacteremia, Septic Shock in the setting of IVDA Continue PRVC as rest mode  Trach collar during day only and rest overnight.   Infective Endocarditis in setting of MSSA Bacteremia  Severe Tricuspid Regurgitation  Acute Diastolic CHF  Ancef for 6 weeks  Follow fever curve / WBC trend    AKI on CKD  Hypernatremia  Hypokalemia  Free water PT  Trend BMP / urinary output Replace electrolytes as indicated Avoid nephrotoxic agents, ensure adequate renal perfusion   Severe Protein Calorie Malnutrition  TF per Nutrition   Anemia  -of critical illness Thrombocytopenia  Trend CBC Transfuse per ICU guidelines   Best practice:  Diet: TF Pain/Anxiety/Delirium protocol (if indicated): precedex VAP protocol (if indicated): in place DVT prophylaxis: scd GI prophylaxis: PPI Glucose control: protocal Mobility: as tolerated Code Status: Limited code Family Communication: Update per primary MD.  Disposition: ICU  CRITICAL CARE Performed by: Kipp Brood   Total critical care time: 35 minutes  Critical care time was exclusive of separately billable procedures and treating other patients.  Critical care was necessary to treat or prevent imminent or life-threatening deterioration.  Critical care was time spent personally by me on the following activities: development of treatment plan with patient and/or surrogate as well as nursing, discussions with consultants, evaluation of patient's response to treatment,  examination of patient, obtaining history from patient or surrogate, ordering and performing treatments and interventions, ordering and review of laboratory studies, ordering and review of radiographic studies, pulse oximetry, re-evaluation of patient's condition and participation in multidisciplinary rounds.  Kipp Brood, MD [facility] ICU Physician [place]  Pager: [phone] Mobile: [phone] After hours: [phone].

## 2019-02-07 NOTE — Progress Notes (Signed)
Physical Therapy Treatment Patient Details Name: Molly [name] MRN: [id number] DOB: 05-24-1986 Today's Date: 02/07/2019    History of Present Illness Pt adm for acute respiratory failure due to Septic pulmonary embolus due to MRSA tricuspid vegetation. Pt intubated 01/20/19. PMH - polysubstance abuse, inadequately treated MRSA endocarditis having signed out AMA  in July 2019, osteomyelitis of spine, pt placed back on vent on 4/21, already going back to trach collar    PT Comments    Pt back on vent via trach. Pt was still able to transfer to EOB and complete std pvt transfer to chair without stedy. Pt able to take 3 steps to chair today. Pt given HEP for LE strengthening. Acute PT to cont to follow to progress mobility.    Follow Up Recommendations  CIR     Equipment Recommendations       Recommendations for Other Services       Precautions / Restrictions Precautions Precautions: Fall Precaution Comments: vent, trach collar Restrictions Weight Bearing Restrictions: No    Mobility  Bed Mobility Overal bed mobility: Needs Assistance Bed Mobility: Supine to Sit     Supine to sit: Mod assist;+2 for safety/equipment     General bed mobility comments: pt initiating LE movement to EOB, modA x2 for trunk elevation and to scoot to EOB, pt with limited UE strength to pull or push to aide in transfer  Transfers Overall transfer level: Needs assistance Equipment used: (2 person lift with bed pads) Transfers: Sit to/from [facility] Sit to Stand: Mod assist;+2 physical assistance Stand pivot transfers: Mod assist;+2 physical assistance(RN to assist with line management)       General transfer comment: pt completed 3 sit to stands and was able to take 3 steps to chair  Ambulation/Gait             General Gait Details: limited by 3 steps to the chair   Stairs             Wheelchair Mobility    Modified Rankin (Stroke Patients Only)        Balance Overall balance assessment: Needs assistance Sitting-balance support: Feet supported;Bilateral upper extremity supported Sitting balance-Leahy Scale: Fair Sitting balance - Comments: pt with L lateral and posterior lean  with onset of fatigue with prolonged sitting, pt tolerated 6 min at EOB limited by back pain and onset of fatigue   Standing balance support: Bilateral upper extremity supported Standing balance-Leahy Scale: Poor Standing balance comment: dependent on physical assist                            Cognition Arousal/Alertness: Awake/alert Behavior During Therapy: WFL for tasks assessed/performed Overall Cognitive Status: Within Functional Limits for tasks assessed                                 General Comments: pt attempting to mouth words, pt consistently following commands, pt appears to be self limiting at times      Exercises Other Exercises Other Exercises: pt given HEP for LAQ, quad sets and ankle pumps    General Comments General comments (skin integrity, edema, etc.): VSS      Pertinent Vitals/Pain Pain Assessment: Faces Faces Pain Scale: Hurts whole lot Pain Location: low back Pain Descriptors / Indicators: Constant;Discomfort;Grimacing Pain Intervention(s): Limited activity within patient's tolerance    Home Living  Prior Function            PT Goals (current goals can now be found in the care plan section) Progress towards PT goals: Progressing toward goals    Frequency    Min 3X/week      PT Plan Current plan remains appropriate    Co-evaluation              AM-PAC PT "6 Clicks" Mobility   Outcome Measure  Help needed turning from your back to your side while in a flat bed without using bedrails?: A Little Help needed moving from lying on your back to sitting on the side of a flat bed without using bedrails?: A Lot Help needed moving to and from a bed to a chair  (including a wheelchair)?: Total Help needed standing up from a chair using your arms (e.g., wheelchair or bedside chair)?: A Lot Help needed to walk in hospital room?: Total Help needed climbing 3-5 steps with a railing? : Total 6 Click Score: 10    End of Session Equipment Utilized During Treatment: Gait belt;Oxygen(via vent) Activity Tolerance: Patient tolerated treatment well Patient left: in chair;with call bell/phone within reach;with chair alarm set Nurse Communication: Mobility status;Need for lift equipment PT Visit Diagnosis: Other abnormalities of gait and mobility (R26.89);Muscle weakness (generalized) (M62.81);Unsteadiness on feet (R26.81)     Time: 1856-3149 PT Time Calculation (min) (ACUTE ONLY): 38 min  Charges:  $Therapeutic Exercise: 8-22 mins $Therapeutic Activity: 23-37 mins                     Molly [name], PT, DPT Acute Rehabilitation Services Pager #: [phone] Office #: [phone]    Molly [name] 02/07/2019, 10:26 AM

## 2019-02-07 NOTE — Progress Notes (Signed)
eLink Physician-Brief Progress Note Patient Name: Tamana Hatfield DOB: 1986-10-14 MRN: [id number]   Date of Service  02/07/2019  HPI/Events of Note  K 3, creatinine 2.23 improving despite diuresis  eICU Interventions  Ordered K 40 meqs per tube. Repeat K and Mg at noon as likely to need more if continued on diuretics     Intervention Category Major Interventions: Electrolyte abnormality - evaluation and management  Judd Lien 02/07/2019, 6:02 AM

## 2019-02-07 NOTE — Evaluation (Signed)
[facility] - Evaluation Patient Details  Name: Molly [name] MRN: [id number] Date of Birth: 1985-11-21  Today's Date: 02/07/2019 Time: 1215-1235 SLP Time Calculation (min) (ACUTE ONLY): 20 min  Past Medical History:  Past Medical History:  Diagnosis Date  . 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 Abscess of skin    buttock - most recently 2009/10  . CKD (chronic kidney disease) stage 3, GFR 30-59 ml/min (HCC)   . Drug-seeking behavior   . Hypersplenism   . IV drug user   . MRSA (methicillin resistant Staphylococcus aureus) infection   . MRSA bacteremia   . Pancytopenia ([place])   . 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 Septic pulmonary embolism ([place])   . Tobacco abuse    Past Surgical History:  Past Surgical History:  Procedure Laterality Date  . IR FLUORO GUIDE CV LINE RIGHT  02/05/2019  . IR GENERIC HISTORICAL  07/19/2016   IR LUMBAR DISC ASPIRATION W/IMG GUIDE 07/19/2016 Arne Cleveland, MD [facility]INTER[facility] RAD  . IR US GUIDE VASC ACCESS RIGHT  02/05/2019  . RADIOLOGY WITH ANESTHESIA N/A 04/11/2018   Procedure: MRI OF LUMBAR AND THORACIC SPINE WITHOUT CONTRAST WITH ANESTHESIA;  Surgeon: Radiologist, Medication, MD;  Location: [place];  Service: Radiology;  Laterality: N/A;  . TEE WITHOUT CARDIOVERSION N/A 07/04/2013   Procedure: TRANSESOPHAGEAL ECHOCARDIOGRAM (TEE);  Surgeon: Larey Dresser, MD;  Location: [facility] ENDOSCOPY;  Service: Cardiovascular;  Laterality: N/A;  . TONSILLECTOMY     33 years old   HPI:  Molly [name] 33 year old female who presented to the emergency department with altered mental status.  Patient was admitted to the hospital with working diagnosis of sepsis with multiorgan failure and possible DIC.  Patient condition deteriorated and required invasive mechanical ventilation 4/4.  CT of the chest was done which was positive for septic emboli and right base pneumonia.  Blood cultures grew MSSA.  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  Patient has history of polysubstance/IV drug abuse.  Patient has been on antibiotic therapy with cefazolin.  Underwent tracheostomy on 3/79, which was complicated by bleeding.   Assessment / Plan / Recommendation Clinical Impression  Pt demonstrates very limited uppar airway patency; only able to tolerate PMSV placement briefly, up to 5 breath cycles, before either blowing off valve or expressing distress. Subsequent removal with significant pressure indicative of limited passage of air past trach. With effort/push pt did have 2 instances of phonation. Also expectorated thick blood from phaynx with air movement. Made several attempts to placement PMSV even after encouraging swallowing and throat clearing to clear pharynx of any potential standing secretions, but no improvement. The pt is quite petite and likely cannot redirect air past 6 Shiley with deflated cuff. Recommend considering a 4 cuffless trach for successful PMSV use. Will follow for progress, see next note for swallow eval.  SLP Visit Diagnosis: Dysphagia, oropharyngeal phase (R13.12)    SLP Assessment  Patient needs continued Speech Lanaguage Pathology Services    Follow Up Recommendations  Inpatient Rehab    Frequency and Duration min 2x/week  2 weeks    PMSV Trial PMSV was placed for: 5 breath cycles Able to redirect subglottic air through upper airway: No(minimal) Able to Attain Phonation: (very minimally) Able to Expectorate Secretions: Yes Level of Secretion Expectoration with PMSV: Tracheal;Oral Intelligibility: Unable to assess (comment) Respirations During Trial: 20 SpO2 During Trial: 100 % Behavior: Cooperative   Tracheostomy Tube  Additional Tracheostomy Tube Assessment Trach Collar  Period: intermittently  Secretion Description: bloody Level of Secretion Expectoration: Tracheal;Oral    Vent Dependency  FiO2 (%): 28 %    Cuff Deflation Trial  GO Tolerated Cuff Deflation: Yes Cuff  Deflation Trial - Comments: oral expectoration        Molly [name], Katherene Ponto 02/07/2019, 1:43 PM

## 2019-02-07 NOTE — Progress Notes (Addendum)
Spoke w/ pts mom to provide updates. Pts mom expressing concerns regarding needed paperwork for Medicaid application appeal.  Stated that I will contact SW and CM regarding this. Pts mom appreciative.   1615:  Spoke w/ Manya Silvas, CM to relay above. [place] states will Banker who had previously been assisting pts mom.

## 2019-02-07 NOTE — Progress Notes (Signed)
Occupational Therapy Treatment Patient Details Name: Molly [name] MRN: [id number] DOB: 1986-07-23 Today's Date: 02/07/2019    History of present illness Pt adm for acute respiratory failure due to Septic pulmonary embolus due to MRSA tricuspid vegetation. Pt intubated 01/20/19. PMH - polysubstance abuse, inadequately treated MRSA endocarditis having signed out AMA  in July 2019, osteomyelitis of spine, pt placed back on vent on 4/21, already going back to trach collar   OT comments  Pt progressing towards established OT goals and continues to be motivated to participate in therapy. Pt performing sit<>stand with Mod A +2 and stedy. Pt tolerating standing with Min A for performance of toilet hygiene; required Max A for toilet hygiene. Continue to recommend dc to CIR and will continue to follow acutely as admitted.   Follow Up Recommendations  CIR;Supervision/Assistance - 24 hour    Equipment Recommendations  Other (comment)(Defer to next venue)    Recommendations for Other Services PT consult    Precautions / Restrictions Precautions Precautions: Fall Precaution Comments: vent, trach collar Restrictions Weight Bearing Restrictions: No       Mobility Bed Mobility Overal bed mobility: Needs Assistance Bed Mobility: Rolling;Sit to Sidelying Rolling: Min assist;+2 for physical assistance   Supine to sit: Mod assist;+2 for safety/equipment   Sit to sidelying: Max assist;+2 for physical assistance;+2 for safety/equipment General bed mobility comments: Max A to manage LB over EOB. Min to roll  Transfers Overall transfer level: Needs assistance Equipment used: (2 person lift with bed pads) Transfers: Sit to/from [facility] Sit to Stand: Mod assist;+2 physical assistance Stand pivot transfers: Mod assist;+2 physical assistance(RN to assist with line management)       General transfer comment: Mod A +2 to power up into standing. Pt demonstrating increased standing  tolerance during toielt hygiene    Balance Overall balance assessment: Needs assistance Sitting-balance support: Feet supported;Bilateral upper extremity supported Sitting balance-Leahy Scale: Fair Sitting balance - Comments: pt with L lateral and posterior lean  with onset of fatigue with prolonged sitting, pt tolerated 6 min at EOB limited by back pain and onset of fatigue   Standing balance support: Bilateral upper extremity supported Standing balance-Leahy Scale: Poor Standing balance comment: dependent on physical assist                           ADL either performed or assessed with clinical judgement   ADL Overall ADL's : Needs assistance/impaired                         Toilet Transfer: Moderate assistance;+2 for physical assistance(sit<>stand with stedy) Toilet Transfer Details (indicate cue type and reason): Mod A +2 to power up and maintain standing. Use of stedy. simulated to [place] and Hygiene: Maximal assistance;Sit to/from stand Toileting - Clothing Manipulation Details (indicate cue type and reason): Pt standing with Min A +2 and then requiring Max A for toilet hygiene with bowel     Functional mobility during ADLs: Moderate assistance;+2 for physical assistance(stedy) General ADL Comments: Pt performing sit<>stand with stedy to complete toilet hygiene after leak form flexy seal     Vision       Perception     Praxis      Cognition Arousal/Alertness: Awake/alert Behavior During Therapy: WFL for tasks assessed/performed Overall Cognitive Status: Difficult to assess Area of Impairment: Following commands;Problem solving  Following Commands: Follows one step commands consistently;Follows one step commands with increased time     Problem Solving: Slow processing General Comments: Pt following commands with increased time. Pt mouthing different words        Exercises  Exercises: Other exercises Other Exercises Other Exercises: pt given HEP for LAQ, quad sets and ankle pumps   Shoulder Instructions       General Comments VSS    Pertinent Vitals/ Pain       Pain Assessment: Faces Faces Pain Scale: Hurts whole lot Pain Location: low back Pain Descriptors / Indicators: Constant;Discomfort;Grimacing Pain Intervention(s): Monitored during session;Limited activity within patient's tolerance;Repositioned  Home Living                                          Prior Functioning/Environment              Frequency  Min 2X/week        Progress Toward Goals  OT Goals(current goals can now be found in the care plan section)  Progress towards OT goals: Progressing toward goals  Acute Rehab OT Goals Patient Stated Goal: Agreeable to OOB to recliner OT Goal Formulation: With patient Time For Goal Achievement: 02/19/19 Potential to Achieve Goals: Good ADL Goals Pt Will Perform Grooming: with set-up;with supervision;sitting Pt Will Perform Upper Body Dressing: with set-up;with supervision;sitting Pt Will Perform Lower Body Dressing: with set-up;with supervision;sit to/from stand Pt Will Transfer to Toilet: with set-up;with supervision;ambulating Additional ADL Goal #1: Pt will peform bed mobility with supervision in preparation for ADLs  Plan Discharge plan remains appropriate    Co-evaluation                 AM-PAC OT "6 Clicks" Daily Activity     Outcome Measure   Help from another person eating meals?: Total Help from another person taking care of personal grooming?: A Little Help from another person toileting, which includes using toliet, bedpan, or urinal?: A Lot Help from another person bathing (including washing, rinsing, drying)?: A Lot Help from another person to put on and taking off regular upper body clothing?: A Little Help from another person to put on and taking off regular lower body clothing?: A  Lot 6 Click Score: 13    End of Session Equipment Utilized During Treatment: Other (comment)(stedy)  OT Visit Diagnosis: Other abnormalities of gait and mobility (R26.89);Unsteadiness on feet (R26.81);Muscle weakness (generalized) (M62.81);Other symptoms and signs involving cognitive function;Pain Pain - part of body: (Pain)   Activity Tolerance Patient tolerated treatment well;Patient limited by pain;Patient limited by fatigue   Patient Left in chair;with call bell/phone within reach;with chair alarm set   Nurse Communication Mobility status;Need for lift equipment        Time: 9767-3419 OT Time Calculation (min): 33 min  Charges: OT General Charges $OT Visit: 1 Visit OT Treatments $Self Care/Home Management : 23-37 mins  [place], OTR/L Acute Rehab Pager: [phone] Office: [place] 02/07/2019, 1:18 PM

## 2019-02-07 NOTE — Progress Notes (Signed)
Placed pt back on full ventilator support from her trach collar. Pt was becoming SOB and requested to be put back on the vent. Pt is stable at this time. RT will continue to monitor.

## 2019-02-07 NOTE — Progress Notes (Signed)
Pt still on ATC, FiO2 weaned to 28%/5L. When pt falls asleep, RR goes into 40's, but drops back down into the 20's when Pt wakes back up. No obvious respiratory distress noted when pt is sleeping. Pt did have an episode earlier in the night with increased SOB, felt like she couldn't breathe. RT assessed pt, changed inner cannula and suctioned trach, and pt was able to relax and go back to sleep.

## 2019-02-07 NOTE — Progress Notes (Signed)
Changed trach ties without incident. No blood was noted.

## 2019-02-08 ENCOUNTER — Inpatient Hospital Stay (HOSPITAL_COMMUNITY): Payer: Medicaid Other

## 2019-02-08 LAB — BASIC METABOLIC PANEL
Anion gap: 8 (ref 5–15)
BUN: 99 mg/dL — ABNORMAL HIGH (ref 6–20)
CO2: 29 mmol/L (ref 22–32)
Calcium: 7.8 mg/dL — ABNORMAL LOW (ref 8.9–10.3)
Chloride: 112 mmol/L — ABNORMAL HIGH (ref 98–111)
Creatinine, Ser: 2.04 mg/dL — ABNORMAL HIGH (ref 0.44–1.00)
GFR calc Af Amer: 37 mL/min — ABNORMAL LOW (ref >60)
GFR calc non Af Amer: 32 mL/min — ABNORMAL LOW (ref >60)
Glucose, Bld: 97 mg/dL (ref 70–99)
Potassium: 3.3 mmol/L — ABNORMAL LOW (ref 3.5–5.1)
Sodium: 149 mmol/L — ABNORMAL HIGH (ref 135–145)

## 2019-02-08 LAB — GLUCOSE, CAPILLARY
Glucose-Capillary: 105 mg/dL — ABNORMAL HIGH (ref 70–99)
Glucose-Capillary: 177 mg/dL — ABNORMAL HIGH (ref 70–99)
Glucose-Capillary: 74 mg/dL (ref 70–99)
Glucose-Capillary: 79 mg/dL (ref 70–99)
Glucose-Capillary: 88 mg/dL (ref 70–99)
Glucose-Capillary: 91 mg/dL (ref 70–99)

## 2019-02-08 MED ORDER — POTASSIUM CHLORIDE 20 MEQ PO PACK
20.0000 meq | PACK | Freq: Two times a day (BID) | ORAL | Status: AC
  Administered 2019-02-08 (×2): 20 meq via ORAL
  Filled 2019-02-08 (×2): qty 1

## 2019-02-08 MED ORDER — OSMOLITE 1.5 CAL PO LIQD
1000.0000 mL | ORAL | Status: DC
  Administered 2019-02-08 – 2019-02-13 (×4): 1000 mL
  Filled 2019-02-08 (×7): qty 1000

## 2019-02-08 MED ORDER — RESOURCE THICKENUP CLEAR PO POWD
ORAL | Status: DC | PRN
  Filled 2019-02-08 (×3): qty 125

## 2019-02-08 MED ORDER — CLONAZEPAM 0.125 MG PO TBDP
0.1250 mg | ORAL_TABLET | Freq: Two times a day (BID) | ORAL | Status: DC | PRN
  Administered 2019-02-08: 0.125 mg via ORAL
  Filled 2019-02-08: qty 1

## 2019-02-08 MED ORDER — CLONAZEPAM 0.125 MG PO TBDP
0.1250 mg | ORAL_TABLET | Freq: Two times a day (BID) | ORAL | Status: DC | PRN
  Administered 2019-02-08 – 2019-03-05 (×22): 0.125 mg via ORAL
  Filled 2019-02-08 (×22): qty 1

## 2019-02-08 MED ORDER — VANCOMYCIN 50 MG/ML ORAL SOLUTION
125.0000 mg | Freq: Four times a day (QID) | ORAL | Status: DC
  Administered 2019-02-08 – 2019-02-19 (×48): 125 mg
  Filled 2019-02-08 (×54): qty 2.5

## 2019-02-08 NOTE — Progress Notes (Signed)
Spoke w/ pts mom to provide updates.

## 2019-02-08 NOTE — Progress Notes (Signed)
Pt back from MBS. Recommendation is Reg diet w/ nectar thick liquids. Md aware. Have paged dietary.

## 2019-02-08 NOTE — Care Management (Signed)
For patient to be considered for facility placement at [facility], patient will need a COVID screen. Please considering ordering this.

## 2019-02-08 NOTE — Progress Notes (Signed)
Assisted tele visit to patient with family member.  Macy Lingenfelter McEachran, RN  

## 2019-02-08 NOTE — Progress Notes (Signed)
Nutrition Follow-up  DOCUMENTATION CODES:   Not applicable  INTERVENTION:   Tube Feeding:  Switch to nocturnal continuous TF for 12 hours (2000 to 0800 daily) Osmolite 1.5 at 65 ml/hr for 12 hours Provide 1170 kcals, 49 g of protein and 593 mL of free water 70% calorie needs and protein needs (additional protein via Pro-stat); adjust based on po intake  Continue 30 ml Prostat BID per tube, each supplement provides 100 kcals and 15 grams protein.   Magic cup TID with meals, each supplement provides 290 kcal and 9 grams of protein   NUTRITION DIAGNOSIS:   Inadequate oral intake related to inability to eat(pt sedated and ventilated ) as evidenced by NPO status.  Being addressed via TF   GOAL:   Provide needs based on ASPEN/SCCM guidelines  Progressing  MONITOR:   Vent status, Labs, Weight trends, I & O's, Skin  REASON FOR ASSESSMENT:   Ventilator    ASSESSMENT:    33 y/o female with h/o IVDU/polysubstance abuse and MRSA bacteremia/TV endocarditis/spinal osteomyelitis/septic pulmonary emboli (left AMA July 2019) who presented to the ED with AMS found to be septic with hypothermia, hypotension, and hypoxia.   Tolerating trach collar during the day, vent at night  MBS today, diet advanced to Regular with Nectar Thick. No recorded po intake yet.   Spoke with Evelena Peat RN this AM who indicates pt did not tolerate AM bolus feeding; pt vomited.  Plan to switch to nocturnal continuous feedings until pt demonstrating tolerance of po diet and able at least 75% of needs by mouth  +c.diff, +oral thrush  Labs: sodium 149 (H), Creatinine 2.04, BUN 99 Meds: reviewed  Diet Order:   Diet Order            Diet regular Room service appropriate? Yes; Fluid consistency: Nectar Thick  Diet effective now              EDUCATION NEEDS:   Not appropriate for education at this time  Skin:  Skin Assessment: Reviewed RN Assessment(no pressure injuries noted)  Last BM:  4/23  rectal tube  Height:   Ht Readings from Last 1 Encounters:  01/20/19 5\' 1"  (1.549 m)    Weight:   Wt Readings from Last 1 Encounters:  02/08/19 55.7 kg    Ideal Body Weight:  47.7 kg  BMI:  Body mass index is 23.2 kg/m.  Estimated Nutritional Needs:   Kcal:  1650-1900 kcals   Protein:  70-95 g  Fluid:  >/= 1.7 L   Kerman Passey MS, RD, LDN, CNSC [phone] Pager  [phone] Weekend/On-Call Pager

## 2019-02-08 NOTE — Progress Notes (Signed)
NAME:  Molly [name], MRN:  [id number], DOB:  1986-04-05, LOS: 43 ADMISSION DATE:  01/20/2019, CONSULTATION DATE: 01/20/2019 REFERRING MD:  Bonner Puna, CHIEF COMPLAINT:  Sepsis  Brief History   33 y/o F with a history of IV drug abuse/polysubstance abuse (presented with urine drug screen positive for opiates and amphetamines) and a previous history in June or July 2019 of inadequately treated MRSA endocarditis having signed out AMA at that time.  She had a tricuspid vegetation at that time.  Admitted 4/4 hypotensive and in respiratory distress requiring intubation at [facility] long.   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 Awake / FC. Looks best in many days. Failed SBT 4/12 Failed SBT.  AKI improving, tx PRBC.  Less alert on precedex, methadone  4/13 More alert, FC. On precedex. Failed SBT. ID recommending PICC for antibiotcs > mother not sure about goals of care. S/p 1 unit PRBC   4/17 Trach placement 4/21 tolerating trach collar trials. 4/22: cdiff result positive overnight with continued diarrhea  Consults:  Nephrology  Procedures:  ETT 4/4 >> 4/17 R Femoral line 4/4 >> 4/8 R IJ Tunneled CVC (IR) 4/21 >>  Trach 4/17   Significant Diagnostic Tests:  Echocardiogram 4/4 >> TV vegetation measures 1.5 mm x 0.6 mm., severe TR CT Chest 4/4 >> bilateral pulmonary nodules, peripheral and cavitated.  LE venous  duplex 4/20: negative  Micro Data:  Blood cultures x2 4/4 >> MSSA  BCx2 4/8 >> negative    Antimicrobials:  Vancomycin Zosyn Teflaro >> stopped Ancef 4/4 >>5/19 Diflucan 4/21->4/24 Vancomycin po 4/23->   Interim history/subjective:  Cont with diarrhea overnight, low wbc, afebrile (tmax to 99.1). Pt c/o anxiety and vomiting with bolus feeds.   Objective   Blood pressure (!) 138/98, pulse 68, temperature 98.5 F (36.9 C), temperature source Oral, resp. rate 14, height 5\' 1"  (1.549 m), weight 55.7 kg, SpO2 100 %.    Vent Mode: PRVC FiO2 (%):  [28 %-35 %] 28 % Set Rate:  [15 bmp] 15 bmp Vt Set:  [450 mL-4501 mL] 450 mL PEEP:  [5 cmH20] 5 cmH20 Plateau Pressure:  [15 cmH20-19 cmH20] 16 cmH20   Intake/Output Summary (Last 24 hours) at 02/08/2019 0908 Last data filed at 02/08/2019 0800 Gross per 24 hour  Intake 2208 ml  Output 2465 ml  Net -257 ml   Filed Weights   02/06/19 0500 02/07/19 0500 02/08/19 0320  Weight: 55.3 kg 55.3 kg 55.7 kg   General: chronically ill appearing female sitting up in bed with ice chips HEENT: MM pink/moist, trach midline c/d/i Neuro: Awake, alert, communicating appropriately, MAE CV: s1s2 rrr, no m/r/g PULM: even/non-labored, lungs bilaterally clear, strong cough MW:UXLK, non-tender, bsx4 active  Extremities: warm/dry, no edema Skin: small abrasions/lesions on toes, healing  LABS    PULMONARY No results for input(s): PHART, PCO2ART, PO2ART, HCO3, TCO2, O2SAT in the last 168 hours.  Invalid input(s): PCO2, PO2  CBC Recent Labs  Lab 02/05/19  2725 02/06/19 0253 02/07/19 0332  HGB 7.8* 8.3* 8.1*  HCT 26.3* 27.2* 27.1*  WBC 2.4* 2.9* 3.0*  PLT 75* 95* 89*    COAGULATION Recent Labs  Lab 02/02/19 0950 02/03/19 1901  INR 1.7* 1.5*    CARDIAC  No results for input(s): TROPONINI in the last 168 hours. No results for input(s): PROBNP in the last 168 hours.   CHEMISTRY Recent Labs  Lab 02/04/19 0424 02/05/19 0418 02/06/19  0253 02/07/19 0332 02/07/19 1236 02/08/19 0451  NA 150* 148* 149* 147*  --  149*  K 3.6 3.4* 3.3* 3.0* 3.9 3.3*  CL 108 109 110 109  --  112*  CO2 31 28 28 27   --  29  GLUCOSE 117* 101* 125* 111*  --  97  BUN 113* 116* 118* 108*  --  99*  CREATININE 2.37* 2.40* 2.54* 2.23*  --  2.04*  CALCIUM 7.8* 7.5* 7.6* 7.6*  --  7.8*  MG  --  1.9 2.0  --  1.8  --    Estimated Creatinine Clearance: 30.2 mL/min (A) (by C-G formula based on SCr of 2.04 mg/dL (H)).   LIVER Recent Labs  Lab 02/02/19 0453 02/02/19 0950 02/03/19 0232 02/03/19 1901 02/04/19 0424 02/05/19 0418  AST 22  --  20  --  17 14*  ALT <5  --  5  --  <5 <5  ALKPHOS 100  --  87  --  85 76  BILITOT 0.3  --  0.5  --  0.7 0.5  PROT 7.3  --  7.2  --  7.2 7.3  ALBUMIN 1.1*  --  1.4*  --  1.3* 1.3*  INR  --  1.7*  --  1.5*  --   --      INFECTIOUS No results for input(s): LATICACIDVEN, PROCALCITON in the last 168 hours.   ENDOCRINE CBG (last 3)  Recent Labs    02/07/19 2340 02/08/19 0315 02/08/19 0741  GLUCAP 90 79 74    IMAGING   Dg Abd 1 View  Result Date: 02/06/2019 CLINICAL DATA:  33 year old female with gastric tube placement EXAM: ABDOMEN - 1 VIEW COMPARISON:  02/04/2019 FINDINGS: Weighted tip enteric feeding tube terminates in the pyloric region of the stomach. Intrauterine device in place. Opacities at the lung bases, poorly characterized. No evidence of abnormally distended small bowel. IMPRESSION: Weighted tip enteric feeding tube terminates at the pyloric region of the stomach. Electronically Signed   By: Corrie Mckusick D.O.   On: 02/06/2019 14:11    Resolved Hospital Problem list   Septic Shock   Active Problem list    Acute Hypoxemic Respiratory Failure secondary to MSSA Bacteremia, Septic Shock in the setting of IVDA Continue PRVC as rest mode  Trach collar during day only and rest overnight.   Infective Endocarditis in setting of MSSA Bacteremia   Severe Tricuspid Regurgitation  Acute  Diastolic CHF  Ancef for 6 weeks  Follow fever curve / WBC trend   MSSA bacteremia CDiff infection Oral thrush P: Cont ancef 6 weeks, end dates are in 5/19 vanc po started 4/23 for 10 days fluc to end 4/24   AKI on CKD  Hypernatremia  Hypokalemia  Free water per tube,  Trend BMP / urinary output, maintain indwelling foley for now. Pt not appropriate candidate for purewick.  Replace electrolytes as indicated Avoid nephrotoxic agents, ensure adequate renal perfusion   Severe Protein Calorie Malnutrition  TF per Nutrition  On bolus tf but vomiting  with bolus. May need to go to continuous until better taking po For MBS today.   Anemia  -of critical illness Thrombocytopenia  Trend CBC Transfuse per ICU guidelines  Anxiety Chronic pain:  Transitioned to PO dilaudid tolerating but pt anxious Will add low dose klonopin bid prn for now.    Best practice:  Diet: TF Pain/Anxiety/Delirium protocol (if indicated): precedex VAP protocol (if indicated): in place DVT prophylaxis: scd GI prophylaxis: PPI Glucose control: protocol Mobility: as tolerated Code Status: Limited code Family Communication: Update per primary MD.  Disposition: ICU  CRITICAL CARE Performed by: Audria Nine   Total critical care time: 32 minutes  Critical care time was exclusive of separately billable procedures and treating other patients.  Critical care was necessary to treat or prevent imminent or life-threatening deterioration.  Critical care was time spent personally by me on the following activities: development of treatment plan with patient and/or surrogate as well as nursing, discussions with consultants, evaluation of patient's response to treatment, examination of patient, obtaining history from patient or surrogate, ordering and performing treatments and interventions, ordering and review of laboratory studies, ordering and review of radiographic studies, pulse oximetry, re-evaluation  of patient's condition and participation in multidisciplinary rounds.  Audria Nine, DO ICU Physician [place]  After hours: [phone].

## 2019-02-08 NOTE — Progress Notes (Signed)
Modified Barium Swallow Progress Note  Patient Details  Name: Molly [name] MRN: [id number] Date of Birth: [birth year]  Today's Date: 02/08/2019  Modified Barium Swallow completed.  Full report located under Chart Review in the Imaging Section.  Brief recommendations include the following:  Clinical Impression  Pt demonstrates normal oral phase, strong pharyngeal/laryngeal musculature with but intermittent delayed swallow response. In many instances pt demonstrates immediate timely swallow resposne, but in some cases with necta rand thin, liquids spill to pyriforms prior to laryngeal elevation/hyoid burst. In one instance thin liquid pooled in the pyriforms was aspirated via interarytenoid space as swallow initiated. Presence of NG tube may have contributed. There was a delayed cough, but given no redirection or airflow above trach (pt cannot tolerate PMSV), aspirate was not mobilized. Trials of solids, puree and nectar, even with consecutive straw sips, were tolerated without incident. Recommend pt initiate a regular texture diet and nectar thick liquids. She will likely become even more timely and automatic with intake and NG tube removal. Pt will best protect her airway with thin liquids if she can cough effectively; recommend downsize of trach to 4 cuffed for potential PMSV tolerance.    Swallow Evaluation Recommendations       SLP Diet Recommendations: Regular solids;Nectar thick liquid   Liquid Administration via: Cup;Straw   Medication Administration: Whole meds with liquid   Supervision: Patient able to self feed;Intermittent supervision to cue for compensatory strategies   Compensations: Minimize environmental distractions   Postural Changes: Seated upright at 90 degrees   Oral Care Recommendations: Oral care BID   Other Recommendations: Order thickener from pharmacy;Have oral suction available  Herbie [place], MA [place] Pager  [phone] Office [phone]   Lynann Beaver 02/08/2019,11:39 AM

## 2019-02-08 NOTE — Progress Notes (Signed)
Pt currently receiving bolus TF. Does not appear to be tolerating. Bolus given this AM and pt w/ episode of emesis ~940. Zofran given and MD notified. Have paged dietary regarding continuous TF. Awaiting call back.

## 2019-02-09 LAB — MAGNESIUM: Magnesium: 1.7 mg/dL (ref 1.7–2.4)

## 2019-02-09 LAB — COMPREHENSIVE METABOLIC PANEL
ALT: <5 U/L (ref 0–44)
AST: 14 U/L — ABNORMAL LOW (ref 15–41)
Albumin: 1.3 g/dL — ABNORMAL LOW (ref 3.5–5.0)
Alkaline Phosphatase: 72 U/L (ref 38–126)
Anion gap: 8 (ref 5–15)
BUN: 87 mg/dL — ABNORMAL HIGH (ref 6–20)
CO2: 28 mmol/L (ref 22–32)
Calcium: 7.9 mg/dL — ABNORMAL LOW (ref 8.9–10.3)
Chloride: 109 mmol/L (ref 98–111)
Creatinine, Ser: 1.98 mg/dL — ABNORMAL HIGH (ref 0.44–1.00)
GFR calc Af Amer: 38 mL/min — ABNORMAL LOW (ref >60)
GFR calc non Af Amer: 33 mL/min — ABNORMAL LOW (ref >60)
Glucose, Bld: 98 mg/dL (ref 70–99)
Potassium: 3.7 mmol/L (ref 3.5–5.1)
Sodium: 145 mmol/L (ref 135–145)
Total Bilirubin: 0.5 mg/dL (ref 0.3–1.2)
Total Protein: 7.1 g/dL (ref 6.5–8.1)

## 2019-02-09 LAB — GLUCOSE, CAPILLARY
Glucose-Capillary: 82 mg/dL (ref 70–99)
Glucose-Capillary: 85 mg/dL (ref 70–99)
Glucose-Capillary: 94 mg/dL (ref 70–99)
Glucose-Capillary: 97 mg/dL (ref 70–99)
Glucose-Capillary: 97 mg/dL (ref 70–99)
Glucose-Capillary: 99 mg/dL (ref 70–99)

## 2019-02-09 LAB — CBC
HCT: 24.1 % — ABNORMAL LOW (ref 36.0–46.0)
Hemoglobin: 7.4 g/dL — ABNORMAL LOW (ref 12.0–15.0)
MCH: 28.1 pg (ref 26.0–34.0)
MCHC: 30.7 g/dL (ref 30.0–36.0)
MCV: 91.6 fL (ref 80.0–100.0)
Platelets: 78 10*3/uL — ABNORMAL LOW (ref 150–400)
RBC: 2.63 MIL/uL — ABNORMAL LOW (ref 3.87–5.11)
RDW: 14.9 % (ref 11.5–15.5)
WBC: 3 10*3/uL — ABNORMAL LOW (ref 4.0–10.5)
nRBC: 0 % (ref 0.0–0.2)

## 2019-02-09 LAB — PHOSPHORUS: Phosphorus: 3.2 mg/dL (ref 2.5–4.6)

## 2019-02-09 MED ORDER — POTASSIUM CHLORIDE 20 MEQ/15ML (10%) PO SOLN
20.0000 meq | Freq: Once | ORAL | Status: AC
  Administered 2019-02-09: 20 meq via ORAL
  Filled 2019-02-09: qty 15

## 2019-02-09 MED ORDER — CEFAZOLIN SODIUM-DEXTROSE 2-4 GM/100ML-% IV SOLN
2.0000 g | Freq: Three times a day (TID) | INTRAVENOUS | Status: DC
  Administered 2019-02-09 – 2019-02-20 (×34): 2 g via INTRAVENOUS
  Filled 2019-02-09 (×38): qty 100

## 2019-02-09 MED ORDER — FREE WATER
100.0000 mL | Freq: Three times a day (TID) | Status: DC
  Administered 2019-02-09 – 2019-02-19 (×24): 100 mL

## 2019-02-09 MED ORDER — MAGNESIUM OXIDE 400 (241.3 MG) MG PO TABS
400.0000 mg | ORAL_TABLET | Freq: Two times a day (BID) | ORAL | Status: AC
  Administered 2019-02-09 – 2019-02-10 (×3): 400 mg via ORAL
  Filled 2019-02-09 (×3): qty 1

## 2019-02-09 NOTE — Progress Notes (Signed)
Occupational Therapy Treatment Patient Details Name: Molly [name] MRN: [id number] DOB: April 03, 1986 Today's Date: 02/09/2019    History of present illness Pt is a 33 y.o. 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 ETT 4/4; s/p trach placement 4/17. (+) cdiff 4/23. 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   OT comments  Pt progressing with OT during session. Pt performing transfer to transport chair for nurse to take pt out of room. Pt performing transfers with modA+2 for stability to take a few steps from bed to chair and to prevent pt was premature sitting. Pt denying need to complete any ADL at this time. Pt would greatly benefit from continued OT skilled services for ADL, mobility and safety in CIR setting. OT to follow acutely.  BP:  144/114; 4L O2 at trach collar >95% with activity.     Follow Up Recommendations  CIR;Supervision/Assistance - 24 hour    Equipment Recommendations  None recommended by OT    Recommendations for Other Services      Precautions / Restrictions Precautions Precautions: Fall Precaution Comments: vent, trach collar Restrictions Weight Bearing Restrictions: No       Mobility Bed Mobility Overal bed mobility: Needs Assistance Bed Mobility: Supine to Sit     Supine to sit: Mod assist;+2 for safety/equipment     General bed mobility comments: ModA+2 and bilateral HHA to assist trunk elevation and scoot hips to EOB. Pt does well mouthing "1-2-3" before moving  Transfers Overall transfer level: Needs assistance Equipment used: 2 person hand held assist Transfers: Sit to/from Stand Sit to Stand: Mod assist;+2 physical assistance Stand pivot transfers: Mod assist;+2 physical assistance       General transfer comment: Assist for shuffling feet over from bed to recliner.     Balance Overall balance assessment: Needs assistance Sitting-balance support: Feet supported;Bilateral upper extremity supported Sitting balance-Leahy Scale: Fair     Standing balance support: Bilateral upper extremity supported Standing balance-Leahy Scale: Poor Standing balance comment: Reliant on UE support and external assist; taking a few steps from bed to chair                           ADL either performed or assessed with clinical judgement   ADL Overall ADL's : Needs assistance/impaired Eating/Feeding: Supervision/ safety;Sitting(no food, just beverages) Eating/Feeding Details (indicate cue type and reason): bringing cup to mouth                                 Functional mobility during ADLs: Moderate assistance;+2 for physical assistance General ADL Comments: No stedy required today. pt attempting to sit prematurely.     Vision   Vision Assessment?: No apparent visual deficits   Perception     Praxis      Cognition Arousal/Alertness: Awake/alert Behavior During Therapy: Flat affect Overall Cognitive Status: Difficult to assess Area of Impairment: Following commands;Problem solving                       Following Commands: Follows one step commands consistently     Problem Solving: Slow processing;Requires verbal cues General Comments: Pt following commands with increased time. Pt mouthing different words        Exercises     Shoulder Instructions  General Comments Pt progressing slowly, but steadily. Steps taken today, but pt with severe back pain.    Pertinent Vitals/ Pain       Pain Assessment: Faces Faces Pain Scale: Hurts whole lot Pain Location: low back Pain Descriptors / Indicators: Constant;Discomfort;Grimacing Pain Intervention(s): Limited activity within patient's tolerance;Monitored during session  Home Living                                          Prior  Functioning/Environment              Frequency  Min 3X/week        Progress Toward Goals  OT Goals(current goals can now be found in the care plan section)  Progress towards OT goals: Progressing toward goals  Acute Rehab OT Goals Patient Stated Goal: Go outside OT Goal Formulation: With patient Time For Goal Achievement: 02/19/19 Potential to Achieve Goals: Good ADL Goals Pt Will Perform Grooming: with set-up;with supervision;sitting Pt Will Perform Upper Body Dressing: with set-up;with supervision;sitting Pt Will Perform Lower Body Dressing: with set-up;with supervision;sit to/from stand Pt Will Transfer to Toilet: with set-up;with supervision;ambulating Additional ADL Goal #1: Pt will peform bed mobility with supervision in preparation for ADLs  Plan Discharge plan remains appropriate    Co-evaluation    PT/OT/SLP Co-Evaluation/Treatment: Yes Reason for Co-Treatment: Complexity of the patient's impairments (multi-system involvement);For patient/therapist safety PT goals addressed during session: Mobility/safety with mobility;Balance OT goals addressed during session: ADL's and self-care      AM-PAC OT "6 Clicks" Daily Activity     Outcome Measure   Help from another person eating meals?: Total Help from another person taking care of personal grooming?: A Little Help from another person toileting, which includes using toliet, bedpan, or urinal?: A Lot Help from another person bathing (including washing, rinsing, drying)?: A Lot Help from another person to put on and taking off regular upper body clothing?: A Little Help from another person to put on and taking off regular lower body clothing?: A Lot 6 Click Score: 13    End of Session Equipment Utilized During Treatment: Gait belt  OT Visit Diagnosis: Other abnormalities of gait and mobility (R26.89);Unsteadiness on feet (R26.81);Muscle weakness (generalized) (M62.81);Other symptoms and signs involving  cognitive function;Pain Pain - part of body: (back)   Activity Tolerance Patient limited by pain;Treatment limited secondary to medical complications (Comment)   Patient Left in chair;with nursing/sitter in room(in transport chair)   Nurse Communication Mobility status        Time: 2094-7096 OT Time Calculation (min): 30 min  Charges: OT General Charges $OT Visit: 1 Visit OT Treatments $Self Care/Home Management : 8-22 mins  Darryl Nestle) Marsa Aris OTR/L Acute Rehabilitation Services Pager: [phone] Office: [phone]    Audie Pinto 02/09/2019, 1:49 PM

## 2019-02-09 NOTE — Progress Notes (Signed)
Assisted tele visit to patient with family member.  Molly Salzwedel McEachran, RN  

## 2019-02-09 NOTE — Progress Notes (Signed)
  Speech Language Pathology Treatment: Dysphagia;Passy Muir Speaking valve  Patient Details Name: Molly [name] MRN: [id number] DOB: Jul 25, 1986 Today's Date: 02/09/2019 Time: 0938-1829 SLP Time Calculation (min) (ACUTE ONLY): 15 min  Assessment / Plan / Recommendation Clinical Impression  Pt is tolerating meals and nectar thick liquids without struggle. No signs of aspiration with nectar via straw this am. Again attempted PMSV placement after ongoing education about trach purpose and function with pt. Pt was able to achieve phonation with max effort to push air to upper airway, but she continued to report severe sensation of back pressure and could not tolerate valve for more than a few seconds. Discussed with CCM DO who reported pt will be up for a trach change in three days. Will follow up on Monday.   HPI HPI: Molly [name] 33 year old female who presented to the emergency department with altered mental status.  Patient was admitted to the hospital with working diagnosis of sepsis with multiorgan failure and possible DIC.  Patient condition deteriorated and required invasive mechanical ventilation 4/4.  CT of the chest was done which was positive for septic emboli and right base pneumonia.  Blood cultures grew MSSA.  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  Patient has history of polysubstance/IV drug abuse.  Patient has been on antibiotic therapy with cefazolin.  Underwent tracheostomy on 9/37, which was complicated by bleeding.      SLP Plan  Continue with current plan of care       Recommendations  Diet recommendations: Regular;Nectar-thick liquid Liquids provided via: Cup;Straw Medication Administration: Whole meds with liquid Supervision: Patient able to self feed      Patient may use Passy-Muir Speech Valve: with SLP only         Plan: Continue with current plan of care       GO                Jorryn Hershberger, Katherene Ponto 02/09/2019, 10:20  AM

## 2019-02-09 NOTE — Progress Notes (Addendum)
NAME:  Molly [name], MRN:  [id number], DOB:  1985/12/15, LOS: 32 ADMISSION DATE:  01/20/2019, CONSULTATION DATE: 01/20/2019 REFERRING MD:  Bonner Puna, CHIEF COMPLAINT:  Sepsis  Brief History   33 y/o F with a history of IV drug abuse/polysubstance abuse (presented with urine drug screen positive for opiates and amphetamines) and a previous history in June or July 2019 of inadequately treated MRSA endocarditis having signed out AMA at that time.  She had a tricuspid vegetation at that time.  Admitted 4/4 hypotensive and in respiratory distress requiring intubation at [facility] long.   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 Awake / FC. Looks best in many days. Failed SBT 4/12 Failed SBT.  AKI improving, tx PRBC.  Less alert on precedex, methadone  4/13 More alert, FC. On precedex. Failed SBT. ID recommending PICC for antibiotcs > mother not sure about goals of care. S/p 1 unit PRBC   4/17 Trach placement 4/21 tolerating trach collar trials. 4/23: cdiff result positive overnight with continued diarrhea  Consults:  Nephrology  Procedures:  ETT 4/4 >> 4/17 R Femoral line 4/4 >> 4/8 R IJ Tunneled CVC (IR) 4/21 >>  Trach 4/17   Significant Diagnostic Tests:  Echocardiogram 4/4 >> TV vegetation measures 1.5 mm x 0.6 mm., severe TR CT Chest 4/4 >> bilateral pulmonary nodules, peripheral and cavitated.  LE venous  duplex 4/20: negative  Micro Data:  Blood cultures x2 4/4 >> MSSA  BCx2 4/8 >> negative    Antimicrobials:  Vancomycin Zosyn Teflaro >> stopped Ancef 4/4 >>5/19 Diflucan 4/21->4/24 Vancomycin po 4/23-> 5/2  Interim history/subjective:  4/24: Success with mbs and diet advanced, will cont with nocturnal feeds at this time until po better to ensure nutrition. BP has been slowly increasing will add anti-htn today.   Objective   Blood pressure (!) 136/110, pulse 76, temperature 99.7 F (37.6 C), temperature source Oral, resp. rate 14, height 5\' 1"  (1.549 m), weight 55.7 kg, SpO2 100 %.    Vent Mode: PRVC FiO2 (%):  [28 %-30 %] 30 % Set Rate:  [15 bmp] 15 bmp Vt Set:  [450 mL] 450 mL PEEP:  [5 cmH20] 5 cmH20 Plateau Pressure:  [13 cmH20-15 cmH20] 15 cmH20   Intake/Output Summary (Last 24 hours) at 02/09/2019 0557 Last data filed at 02/09/2019 0300 Gross per 24 hour  Intake 2688.11 ml  Output 2450 ml  Net 238.11 ml   Filed Weights   02/06/19 0500 02/07/19 0500 02/08/19 0320  Weight: 55.3 kg 55.3 kg 55.7 kg   General: chronically ill appearing female sitting up in bed eating breakfast HEENT: MM pink/moist, trach midline c/d/i Neuro: Awake, alert, communicating appropriately, MAE CV: s1s2 rrr, no m/r/g PULM: even/non-labored, lungs bilaterally clear, strong cough WP:YKDX, non-tender, bsx4 active  Extremities: warm/dry, no edema Skin: small abrasions/lesions on toes, healing  LABS    PULMONARY No results for input(s): PHART, PCO2ART, PO2ART, HCO3, TCO2, O2SAT in the last 168 hours.  Invalid  input(s): PCO2, PO2  CBC Recent Labs  Lab 02/05/19 0418 02/06/19 0253 02/07/19 0332  HGB 7.8* 8.3* 8.1*  HCT 26.3* 27.2* 27.1*  WBC 2.4* 2.9* 3.0*  PLT 75* 95* 89*    COAGULATION Recent Labs  Lab 02/02/19 0950 02/03/19 1901  INR 1.7* 1.5*    CARDIAC  No results for input(s): TROPONINI in the last 168 hours. No results for input(s): PROBNP in the last 168 hours.    CHEMISTRY Recent Labs  Lab 02/04/19 0424 02/05/19 0418 02/06/19 0253 02/07/19 0332 02/07/19 1236 02/08/19 0451  NA 150* 148* 149* 147*  --  149*  K 3.6 3.4* 3.3* 3.0* 3.9 3.3*  CL 108 109 110 109  --  112*  CO2 31 28 28 27   --  29  GLUCOSE 117* 101* 125* 111*  --  97  BUN 113* 116* 118* 108*  --  99*  CREATININE 2.37* 2.40* 2.54* 2.23*  --  2.04*  CALCIUM 7.8* 7.5* 7.6* 7.6*  --  7.8*  MG  --  1.9 2.0  --  1.8  --    Estimated Creatinine Clearance: 30.2 mL/min (A) (by C-G formula based on SCr of 2.04 mg/dL (H)).   LIVER Recent Labs  Lab 02/02/19 0950 02/03/19 0232 02/03/19 1901 02/04/19 0424 02/05/19 0418  AST  --  20  --  17 14*  ALT  --  5  --  <5 <5  ALKPHOS  --  87  --  85 76  BILITOT  --  0.5  --  0.7 0.5  PROT  --  7.2  --  7.2 7.3  ALBUMIN  --  1.4*  --  1.3* 1.3*  INR 1.7*  --  1.5*  --   --      INFECTIOUS No results for input(s): LATICACIDVEN, PROCALCITON in the last 168 hours.   ENDOCRINE CBG (last 3)  Recent Labs    02/08/19 1938 02/08/19 2344 02/09/19 0356  GLUCAP 177* 88 97    IMAGING   Dg Swallowing Func-speech Pathology  Result Date: 02/08/2019 Objective Swallowing Evaluation: Type of Study: MBS-Modified Barium Swallow Study  Patient Details Name: Mercedez Boule MRN: [id number] Date of Birth: May 28, 1986 Today's Date: 02/08/2019 Time: SLP Start Time (ACUTE ONLY): 1050 -SLP Stop Time (ACUTE ONLY): 1100 SLP Time Calculation (min) (ACUTE ONLY): 10 min Past Medical History: Past Medical History: Diagnosis Date . 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 Abscess of skin   buttock - most recently 2009/10 . CKD (chronic kidney disease) stage 3, GFR 30-59 ml/min (HCC)  . Drug-seeking behavior  . Hypersplenism  . IV drug user  . MRSA (methicillin resistant Staphylococcus aureus) infection  . MRSA bacteremia  . Pancytopenia ([place])  . 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)  . Septic pulmonary embolism ([place])  . Tobacco abuse  Past Surgical History: Past  Surgical History: Procedure Laterality Date . IR FLUORO GUIDE CV LINE RIGHT  02/05/2019 . IR GENERIC HISTORICAL  07/19/2016  IR LUMBAR DISC ASPIRATION W/IMG GUIDE 07/19/2016 Arne Cleveland, MD [facility]-INTERV RAD . IR US GUIDE VASC ACCESS RIGHT  02/05/2019 . RADIOLOGY WITH ANESTHESIA N/A 04/11/2018  Procedure: MRI OF LUMBAR AND THORACIC SPINE WITHOUT CONTRAST WITH ANESTHESIA;  Surgeon: Radiologist, Medication, MD;  Location: [place];  Service: Radiology;  Laterality: N/A; . TEE WITHOUT CARDIOVERSION N/A 07/04/2013  Procedure: TRANSESOPHAGEAL ECHOCARDIOGRAM (TEE);  Surgeon: Larey Dresser, MD;  Location: [place];  Service: Cardiovascular;  Laterality: N/A; . TONSILLECTOMY    33 years  old HPI: Hiba Garry 33 year old female who presented to the emergency department with altered mental status.  Patient was admitted to the hospital with working diagnosis of sepsis with multiorgan failure and possible DIC.  Patient condition deteriorated and required invasive mechanical ventilation 4/4.  CT of the chest was done which was positive for septic emboli and right base pneumonia.  Blood cultures grew MSSA.  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  Patient has history of polysubstance/IV drug abuse.  Patient has been on antibiotic therapy with cefazolin.  Underwent tracheostomy on 7/65, which was complicated by bleeding.  No data recorded Assessment / Plan / Recommendation CHL IP CLINICAL IMPRESSIONS 02/08/2019 Clinical Impression Pt demonstrates normal oral phase, strong pharyngeal/laryngeal musculature with but intermittent delayed swallow response. In many instances pt demonstrates immediate timely swallow resposne, but in some cases with necta rand thin, liquids spill to pyriforms prior to laryngeal elevation/hyoid burst. In one instance thin liquid pooled in the pyriforms was aspirated via interarytenoid space as swallow initiated. Presence of NG tube may have contributed. There was a delayed cough,  but given no redirection or airflow above trach (pt cannot tolerate PMSV), aspirate was not mobilized. Trials of solids, puree and nectar, even with consecutive straw sips, were tolerated without incident. Recommend pt initiate a regular texture diet and nectar thick liquids. She will likely become even more timely and automatic with intake and NG tube removal. Pt will best protect her airway with thin liquids if she can cough effectively; recommend downsize of trach to 4 cuffed for potential PMSV tolerance.  SLP Visit Diagnosis Dysphagia, oropharyngeal phase (R13.12) Attention and concentration deficit following -- Frontal lobe and executive function deficit following -- Impact on safety and function Moderate aspiration risk   CHL IP TREATMENT RECOMMENDATION 02/08/2019 Treatment Recommendations Therapy as outlined in treatment plan below   No flowsheet data found. CHL IP DIET RECOMMENDATION 02/08/2019 SLP Diet Recommendations Regular solids;Nectar thick liquid Liquid Administration via Cup;Straw Medication Administration Whole meds with liquid Compensations Minimize environmental distractions Postural Changes Seated upright at 90 degrees   CHL IP OTHER RECOMMENDATIONS 02/08/2019 Recommended Consults -- Oral Care Recommendations Oral care BID Other Recommendations Order thickener from pharmacy;Have oral suction available   CHL IP FOLLOW UP RECOMMENDATIONS 02/08/2019 Follow up Recommendations Inpatient Rehab   CHL IP FREQUENCY AND DURATION 02/08/2019 Speech Therapy Frequency (ACUTE ONLY) min 2x/week Treatment Duration 2 weeks      CHL IP ORAL PHASE 02/08/2019 Oral Phase WFL Oral - Pudding Teaspoon -- Oral - Pudding Cup -- Oral - Honey Teaspoon -- Oral - Honey Cup -- Oral - Nectar Teaspoon -- Oral - Nectar Cup -- Oral - Nectar Straw -- Oral - Thin Teaspoon -- Oral - Thin Cup -- Oral - Thin Straw -- Oral - Puree -- Oral - Mech Soft -- Oral - Regular -- Oral - Multi-Consistency -- Oral - Pill -- Oral Phase - Comment --  CHL  IP PHARYNGEAL PHASE 02/08/2019 Pharyngeal Phase Impaired Pharyngeal- Pudding Teaspoon -- Pharyngeal -- Pharyngeal- Pudding Cup -- Pharyngeal -- Pharyngeal- Honey Teaspoon -- Pharyngeal -- Pharyngeal- Honey Cup -- Pharyngeal -- Pharyngeal- Nectar Teaspoon -- Pharyngeal -- Pharyngeal- Nectar Cup -- Pharyngeal -- Pharyngeal- Nectar Straw Delayed swallow initiation-pyriform sinuses Pharyngeal -- Pharyngeal- Thin Teaspoon -- Pharyngeal -- Pharyngeal- Thin Cup Delayed swallow initiation-pyriform sinuses Pharyngeal -- Pharyngeal- Thin Straw Delayed swallow initiation-pyriform sinuses;Penetration/Aspiration during swallow;Penetration/Aspiration before swallow;Moderate aspiration Pharyngeal Material enters airway, passes BELOW cords and not ejected out despite cough attempt by patient;Material does  not enter airway Pharyngeal- Puree Delayed swallow initiation-vallecula Pharyngeal -- Pharyngeal- Mechanical Soft -- Pharyngeal -- Pharyngeal- Regular Delayed swallow initiation-vallecula Pharyngeal -- Pharyngeal- Multi-consistency -- Pharyngeal -- Pharyngeal- Pill -- Pharyngeal -- Pharyngeal Comment --  No flowsheet data found. Herbie Baltimore, MA CCC-SLP Acute Rehabilitation Services Pager [phone] Office [phone] Lynann Beaver 02/08/2019, 11:41 AM               Resolved Hospital Problem list   Septic Shock   Active Problem list    Acute Hypoxemic Respiratory Failure secondary to MSSA Bacteremia, Septic Shock in the setting of IVDA Continue PRVC as rest mode  Trach collar during day only and rest overnight. Hope to go to continuous tct as tolerated   Infective Endocarditis in setting of MSSA Bacteremia   Severe Tricuspid Regurgitation  Acute Diastolic CHF  Ancef for 6 weeks  Follow fever curve / WBC trend   MSSA bacteremia CDiff infection Oral thrush P: Cont ancef 6 weeks, end dates are in 5/19 vanc po started 4/23 for 10 days fluc completed for thrush   AKI on CKD  Hypernatremia   Hypokalemia  Free water per tube, will decrease today Trend BMP / urinary output, maintain indwelling foley for now. Pt not appropriate candidate for purewick.  Replace electrolytes as indicated Avoid nephrotoxic agents, ensure adequate renal perfusion  Improving renal function at this time even with diuresis ongoing  Severe Protein Calorie Malnutrition  TF per Nutrition to continue nocturnal for now MBS successful and able to take po but want to ensure adequate intake for now   Anemia  -of critical illness Transfuse <7 Thrombocytopenia  Relatively stable   Anxiety Chronic pain:  cont PO dilaudid tolerating but pt anxious cont low dose klonopin bid prn for now.    Best practice:  Diet: nocturnalTF and po Pain/Anxiety/Delirium protocol (if indicated): klonopin and dialudid and fentanyl patch VAP protocol (if indicated): in place DVT prophylaxis: scd GI prophylaxis: PPI Glucose control: protocol Mobility: as tolerated Code Status: Limited code Family Communication: not updated today Disposition: ICU  CRITICAL CARE Performed by: Audria Nine   Total critical care time: 31 minutes  Critical care time was exclusive of separately billable procedures and treating other patients.  Critical care was necessary to treat or prevent imminent or life-threatening deterioration.  Critical care was time spent personally by me on the following activities: development of treatment plan with patient and/or surrogate as well as nursing, discussions with consultants, evaluation of patient's response to treatment, examination of patient, obtaining history from patient or surrogate, ordering and performing treatments and interventions, ordering and review of laboratory studies, ordering and review of radiographic studies, pulse oximetry, re-evaluation of patient's condition and participation in multidisciplinary rounds.  Audria Nine, DO ICU Physician [place]   After hours: [phone].

## 2019-02-09 NOTE — Progress Notes (Signed)
Physical Therapy Treatment Patient Details Name: Molly [name] MRN: [id number] DOB: 1986/09/23 Today's Date: 02/09/2019    History of Present Illness Pt is a 33 y.o. 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 ETT 4/4; s/p trach placement 4/17. (+) cdiff 4/23. 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   PT Comments    Pt progressing with mobility. Performed transfer and gait training with HHA and modA+2. Pt limited by generalized weakness and decreased activity tolerance. Able to transfer to transport chair to go outside with RN. VSS on 4L O2 via trach collar. Feel pt would benefit from intensive CIR-level therapies to maximize functional mobility and independence prior to return home. Noted CIR AC note 02/05/19 recommending SNF due to need for long-term antibiotics.    Follow Up Recommendations  SNF;Supervision for mobility/OOB(CIR declined due to need for longterm antibiotics)     Equipment Recommendations  (TBD)    Recommendations for Other Services       Precautions / Restrictions Precautions Precautions: Fall Precaution Comments: vent, trach collar Restrictions Weight Bearing Restrictions: No    Mobility  Bed Mobility Overal bed mobility: Needs Assistance Bed Mobility: Supine to Sit     Supine to sit: Mod assist;+2 for safety/equipment     General bed mobility comments: ModA+2 and bilateral HHA to assist trunk elevation and scoot hips to EOB. Pt does well mouthing "1-2-3" before moving  Transfers Overall transfer level: Needs assistance Equipment used: 2 person hand held assist Transfers: Sit to/from Stand Sit to Stand: Mod assist;+2 physical assistance         General transfer comment: ModA+2 to power into standing, heavy reliance on bilateral HHA   Ambulation/Gait Ambulation/Gait  assistance: Mod assist;+2 physical assistance;+2 safety/equipment Gait Distance (Feet): 5 Feet Assistive device: 2 person hand held assist Gait Pattern/deviations: Step-to pattern;Leaning posteriorly;Trunk flexed Gait velocity: Decreased   General Gait Details: Slow, unsteady gait to transport chair; heavy modA+2 with HHA, pt with R-lateral and posterior lean attempting to sit prematurely requiring max cues for safety   Stairs             Wheelchair Mobility    Modified Rankin (Stroke Patients Only)       Balance Overall balance assessment: Needs assistance Sitting-balance support: Feet supported;Bilateral upper extremity supported Sitting balance-Leahy Scale: Fair     Standing balance support: Bilateral upper extremity supported Standing balance-Leahy Scale: Poor Standing balance comment: Reliant on UE support and external assist                            Cognition Arousal/Alertness: Awake/alert Behavior During Therapy: Flat affect Overall Cognitive Status: Difficult to assess Area of Impairment: Following commands;Problem solving                       Following Commands: Follows one step commands consistently     Problem Solving: Slow processing;Requires verbal cues        Exercises      General Comments        Pertinent Vitals/Pain Pain Assessment: Faces Faces Pain Scale: Hurts whole lot Pain Location: low back Pain Descriptors / Indicators: Constant;Discomfort;Grimacing Pain Intervention(s): Limited activity within patient's tolerance;Monitored during session    Home Living  Prior Function            PT Goals (current goals can now be found in the care plan section) Acute Rehab PT Goals Patient Stated Goal: Go outside PT Goal Formulation: With patient Time For Goal Achievement: 02/16/19 Potential to Achieve Goals: Good Progress towards PT goals: Progressing toward goals     Frequency    Min 3X/week      PT Plan Current plan remains appropriate    Co-evaluation PT/OT/SLP Co-Evaluation/Treatment: Yes Reason for Co-Treatment: For patient/therapist safety;To address functional/ADL transfers PT goals addressed during session: Mobility/safety with mobility;Balance        AM-PAC PT "6 Clicks" Mobility   Outcome Measure  Help needed turning from your back to your side while in a flat bed without using bedrails?: A Little Help needed moving from lying on your back to sitting on the side of a flat bed without using bedrails?: A Lot Help needed moving to and from a bed to a chair (including a wheelchair)?: A Lot Help needed standing up from a chair using your arms (e.g., wheelchair or bedside chair)?: A Lot Help needed to walk in hospital room?: A Lot Help needed climbing 3-5 steps with a railing? : Total 6 Click Score: 12    End of Session Equipment Utilized During Treatment: Gait belt;Oxygen(4L O2 via trach collar) Activity Tolerance: Patient tolerated treatment well Patient left: Other (comment)(in transport chair to go outside with RN) Nurse Communication: Mobility status PT Visit Diagnosis: Other abnormalities of gait and mobility (R26.89);Muscle weakness (generalized) (M62.81);Unsteadiness on feet (R26.81)     Time: 1610-9604 PT Time Calculation (min) (ACUTE ONLY): 30 min  Charges:  $Gait Training: 8-22 mins                    Mabeline Caras, PT, DPT Acute Rehabilitation Services  Pager [phone] Office [place] 02/09/2019, 12:26 PM

## 2019-02-10 DIAGNOSIS — Z930 Tracheostomy status: Secondary | ICD-10-CM

## 2019-02-10 DIAGNOSIS — Z789 Other specified health status: Secondary | ICD-10-CM

## 2019-02-10 DIAGNOSIS — F1910 Other psychoactive substance abuse, uncomplicated: Secondary | ICD-10-CM

## 2019-02-10 DIAGNOSIS — N189 Chronic kidney disease, unspecified: Secondary | ICD-10-CM

## 2019-02-10 DIAGNOSIS — Z4659 Encounter for fitting and adjustment of other gastrointestinal appliance and device: Secondary | ICD-10-CM

## 2019-02-10 DIAGNOSIS — E860 Dehydration: Secondary | ICD-10-CM

## 2019-02-10 DIAGNOSIS — J189 Pneumonia, unspecified organism: Secondary | ICD-10-CM

## 2019-02-10 LAB — COMPREHENSIVE METABOLIC PANEL
ALT: <5 U/L (ref 0–44)
AST: 13 U/L — ABNORMAL LOW (ref 15–41)
Albumin: 1.2 g/dL — ABNORMAL LOW (ref 3.5–5.0)
Alkaline Phosphatase: 74 U/L (ref 38–126)
Anion gap: 9 (ref 5–15)
BUN: 71 mg/dL — ABNORMAL HIGH (ref 6–20)
CO2: 26 mmol/L (ref 22–32)
Calcium: 7.7 mg/dL — ABNORMAL LOW (ref 8.9–10.3)
Chloride: 107 mmol/L (ref 98–111)
Creatinine, Ser: 1.98 mg/dL — ABNORMAL HIGH (ref 0.44–1.00)
GFR calc Af Amer: 38 mL/min — ABNORMAL LOW (ref >60)
GFR calc non Af Amer: 33 mL/min — ABNORMAL LOW (ref >60)
Glucose, Bld: 99 mg/dL (ref 70–99)
Potassium: 3.2 mmol/L — ABNORMAL LOW (ref 3.5–5.1)
Sodium: 142 mmol/L (ref 135–145)
Total Bilirubin: 0.5 mg/dL (ref 0.3–1.2)
Total Protein: 6.8 g/dL (ref 6.5–8.1)

## 2019-02-10 LAB — GLUCOSE, CAPILLARY
Glucose-Capillary: 86 mg/dL (ref 70–99)
Glucose-Capillary: 78 mg/dL (ref 70–99)
Glucose-Capillary: 104 mg/dL — ABNORMAL HIGH (ref 70–99)
Glucose-Capillary: 114 mg/dL — ABNORMAL HIGH (ref 70–99)
Glucose-Capillary: 106 mg/dL — ABNORMAL HIGH (ref 70–99)

## 2019-02-10 LAB — CBC
HCT: 23.4 % — ABNORMAL LOW (ref 36.0–46.0)
Hemoglobin: 7.2 g/dL — ABNORMAL LOW (ref 12.0–15.0)
MCH: 27.9 pg (ref 26.0–34.0)
MCHC: 30.8 g/dL (ref 30.0–36.0)
MCV: 90.7 fL (ref 80.0–100.0)
Platelets: 81 10*3/uL — ABNORMAL LOW (ref 150–400)
RBC: 2.58 MIL/uL — ABNORMAL LOW (ref 3.87–5.11)
RDW: 14.9 % (ref 11.5–15.5)
WBC: 2.7 10*3/uL — ABNORMAL LOW (ref 4.0–10.5)
nRBC: 0 % (ref 0.0–0.2)

## 2019-02-10 LAB — PHOSPHORUS: Phosphorus: 3.3 mg/dL (ref 2.5–4.6)

## 2019-02-10 LAB — MAGNESIUM: Magnesium: 1.6 mg/dL — ABNORMAL LOW (ref 1.7–2.4)

## 2019-02-10 MED ORDER — POTASSIUM CHLORIDE 20 MEQ/15ML (10%) PO SOLN
40.0000 meq | Freq: Once | ORAL | Status: AC
  Administered 2019-02-10: 15:00:00 40 meq
  Filled 2019-02-10: qty 30

## 2019-02-10 NOTE — Progress Notes (Signed)
NAME:  Molly [name], MRN:  [id number], DOB:  [birth year], LOS: 35 ADMISSION DATE:  01/20/2019, CONSULTATION DATE: 01/20/2019 REFERRING MD:  Bonner Puna, CHIEF COMPLAINT:  Sepsis  Brief History   33 y/o F with a history of IV drug abuse/polysubstance abuse (presented with urine drug screen positive for opiates and amphetamines) and a previous history in June or July 2019 of inadequately treated MRSA endocarditis having signed out AMA at that time.  She had a tricuspid vegetation at that time.  Admitted 4/4 hypotensive and in respiratory distress requiring intubation at [facility] long.   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 Awake / FC. Looks best in many days. Failed SBT 4/12 Failed SBT.  AKI improving, tx PRBC.  Less alert on precedex, methadone  4/13 More alert, FC. On precedex. Failed SBT. ID recommending PICC for antibiotcs > mother not sure about goals of care. S/p 1 unit PRBC   4/17 Trach placement 4/21 tolerating trach collar trials. 4/23: cdiff result positive overnight with continued diarrhea  Consults:  Nephrology  Procedures:  ETT 4/4 >> 4/17 R Femoral line 4/4 >> 4/8 R IJ Tunneled CVC (IR) 4/21 >>  Trach 4/17   Significant Diagnostic Tests:  Echocardiogram 4/4 >> TV vegetation measures 1.5 mm x 0.6 mm., severe TR CT Chest 4/4 >> bilateral pulmonary nodules, peripheral and cavitated.  LE venous  duplex 4/20: negative  Micro Data:  Blood cultures x2 4/4 >> MSSA  BCx2 4/8 >> negative    Antimicrobials:  Vancomycin Zosyn Teflaro >> stopped Ancef 4/4 >>5/19 Diflucan 4/21->4/24 Vancomycin po 4/23-> 5/2  Interim history/subjective:  02/10/2019 complains of back pain.  Objective   Blood pressure (!) 140/117, pulse 99, temperature 99.1 F (37.3 C), temperature source Oral, resp. rate (!) 26, height 5\' 1"  (1.549 m), weight 54.5 kg, SpO2 100 %.    Vent Mode: PRVC FiO2 (%):  [28 %-30 %] 28 % Set Rate:  [15 bmp] 15 bmp Vt Set:  [450 mL] 450 mL PEEP:  [5 cmH20] 5 cmH20 Plateau Pressure:  [15 cmH20] 15 cmH20   Intake/Output Summary (Last 24 hours) at 02/10/2019 1346 Last data filed at 02/10/2019 1200 Gross per 24 hour  Intake 1400 ml  Output 2465 ml  Net -1065 ml   Filed Weights   02/08/19 0320 02/09/19 0500 02/10/19 0345  Weight: 55.7 kg 53.9 kg 54.5 kg    General: Frail cachectic female no acute distress at rest.  Complaining of back pain. HEENT: Tracheostomy is in place. Neuro: Awake alert requesting pain medications CV: s1s2 rrr, no m/r/g PULM: even/non-labored, lungs bilaterally diminished WS:FKCL, non-tender, bsx4 active  Extremities: warm/dry, negative edema  Skin: no rashes or lesions   LABS    PULMONARY No results for input(s): PHART, PCO2ART, PO2ART, HCO3, TCO2, O2SAT in the last 168 hours.  Invalid input(s): PCO2, PO2  CBC Recent Labs  Lab 02/07/19 0332 02/09/19 0602 02/10/19 0524  HGB 8.1* 7.4* 7.2*  HCT 27.1* 24.1*  23.4*  WBC 3.0* 3.0* 2.7*  PLT 89* 78* 81*    COAGULATION Recent Labs  Lab 02/03/19 1901  INR 1.5*    CARDIAC  No results for input(s): TROPONINI in the last 168 hours. No results for input(s): PROBNP in the last 168 hours.   CHEMISTRY Recent Labs  Lab 02/05/19 0418 02/06/19 0253 02/07/19 0332 02/07/19 1236 02/08/19 0451 02/09/19 0602 02/10/19 0524  NA 148* 149* 147*  --  149* 145 142  K 3.4* 3.3* 3.0* 3.9 3.3*  3.7 3.2*  CL 109 110 109  --  112* 109 107  CO2 28 28 27   --  29 28 26   GLUCOSE 101* 125* 111*  --  97 98 99  BUN 116* 118* 108*  --  99* 87* 71*  CREATININE 2.40* 2.54* 2.23*  --  2.04* 1.98* 1.98*  CALCIUM 7.5* 7.6* 7.6*  --  7.8* 7.9* 7.7*  MG 1.9 2.0  --  1.8  --  1.7 1.6*  PHOS  --   --   --   --   --  3.2 3.3   Estimated Creatinine Clearance: 31.1 mL/min (A) (by C-G formula based on SCr of 1.98 mg/dL (H)).   LIVER Recent Labs  Lab 02/03/19 1901 02/04/19 0424 02/05/19 0418 02/09/19 0602 02/10/19 0524  AST  --  17 14* 14* 13*  ALT  --  <5 <5 <5 <5  ALKPHOS  --  85 76 72 74  BILITOT  --  0.7 0.5 0.5 0.5  PROT  --  7.2 7.3 7.1 6.8  ALBUMIN  --  1.3* 1.3* 1.3* 1.2*  INR 1.5*  --   --   --   --      INFECTIOUS No results for input(s): LATICACIDVEN, PROCALCITON in the last 168 hours.   ENDOCRINE CBG (last 3)  Recent Labs    02/10/19 0346 02/10/19 0753 02/10/19 1155  GLUCAP 86 78 104*    IMAGING   No results found.  Resolved Hospital Problem list   Septic Shock   Active Problem list    Acute Hypoxemic Respiratory Failure secondary to MSSA Bacteremia, Septic Shock in the setting of IVDA 02/10/2019 we will try for trach collar 24 hours Vent as needed   Infective Endocarditis in setting of MSSA Bacteremia   Severe Tricuspid Regurgitation  Acute Diastolic CHF  Ancef for 6 weeks  Follow fever curve / WBC trend   MSSA bacteremia CDiff infection Oral thrush P: Ancef end date 03/06/2019 02/08/2019 vancomycin p.o. started for 10 days f   AKI on CKD  Hypernatremia  Hypokalemia  Lab Results  Component Value Date   CREATININE 1.98 (H) 02/10/2019   CREATININE 1.98 (H) 02/09/2019   CREATININE 2.04 (H) 02/08/2019   CREATININE 0.97 11/02/2016   CREATININE 0.79 08/24/2016   Recent Labs  Lab 02/08/19 0451 02/09/19 0602 02/10/19 0524  K 3.3* 3.7 3.2*   Recent Labs  Lab 02/08/19 0451 02/09/19 0602 02/10/19 0524  NA 149* 145 142    Currently on  free water per tube Trend creatinine currently stable Replace electrolytes as needed Continue to monitor renal function  Severe Protein Calorie Malnutrition  TF per Nutrition to continue nocturnal for now MBS successful and able to take po but want to ensure adequate intake for now   Anemia  Recent Labs    02/09/19 0602 02/10/19 0524  HGB 7.4* 7.2*    Continue tube feeding as tolerated   Thrombocytopenia   no significant change  Anxiety Chronic pain:  Continue p.o. Dilaudid.  She is requesting IV Dilaudid. Continue low-dose Klonopin for now   Best practice:  Diet: nocturnalTF and po Pain/Anxiety/Delirium protocol (if indicated): klonopin and dialudid and fentanyl patch VAP protocol (if indicated): in place DVT prophylaxis: scd GI prophylaxis: PPI Glucose control: protocol Mobility: as tolerated Code Status: Limited code Family Communication: Family updated on 39 via teleconference. Disposition: ICU  CRITICAL CARE APP CCT 30 min   Richardson Landry Minor ACNP Maryanna Shape PCCM Pager [phone] till 1 pm If no answer page [phone] 02/10/2019, 1:46 PM

## 2019-02-10 NOTE — Progress Notes (Signed)
Physical Therapy Treatment Patient Details Name: Molly [name] MRN: [id number] DOB: 1986-03-31 Today's Date: 02/10/2019    History of Present Illness Pt is a 33 y.o. 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 ETT 4/4; s/p trach placement 4/17. (+) cdiff 4/23. 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    PT Comments    Patient on TC 28%, satting well. Min A to come to siting EOB today, patient declining standing EOB due to high levels of back pain. Will cont to follow.     Follow Up Recommendations  SNF;Supervision for mobility/OOB     Equipment Recommendations  Other (comment)    Recommendations for Other Services       Precautions / Restrictions Precautions Precautions: Fall Precaution Comments: vent, trach collar Restrictions Weight Bearing Restrictions: No    Mobility  Bed Mobility Overal bed mobility: Needs Assistance Bed Mobility: Supine to Sit     Supine to sit: Mod assist;+2 for safety/equipment     General bed mobility comments: ModA+2 and bilateral HHA to assist trunk elevation and scoot hips to EOB. Pt does well mouthing "1-2-3" before moving  Transfers                    Ambulation/Gait                 Stairs             Wheelchair Mobility    Modified Rankin (Stroke Patients Only)       Balance Overall balance assessment: Needs assistance Sitting-balance support: Feet supported;Bilateral upper extremity supported Sitting balance-Leahy Scale: Fair     Standing balance support: Bilateral upper extremity supported Standing balance-Leahy Scale: Poor Standing balance comment: Reliant on UE support and external assist; taking a few steps from bed to chair                            Cognition Arousal/Alertness: Awake/alert Behavior During  Therapy: Flat affect Overall Cognitive Status: Difficult to assess Area of Impairment: Following commands;Problem solving                       Following Commands: Follows one step commands consistently     Problem Solving: Slow processing;Requires verbal cues General Comments: Pt following commands with increased time. Pt mouthing different words      Exercises      General Comments        Pertinent Vitals/Pain Faces Pain Scale: Hurts whole lot Pain Location: low back Pain Descriptors / Indicators: Constant;Discomfort;Grimacing    Home Living                      Prior Function            PT Goals (current goals can now be found in the care plan section) Acute Rehab PT Goals Patient Stated Goal: Go outside PT Goal Formulation: With patient Time For Goal Achievement: 02/16/19 Potential to Achieve Goals: Good Progress towards PT goals: Progressing toward goals    Frequency    Min 3X/week      PT Plan Current plan remains appropriate    Co-evaluation              AM-PAC PT "6 Clicks" Mobility   Outcome Measure  Help needed  turning from your back to your side while in a flat bed without using bedrails?: A Little Help needed moving from lying on your back to sitting on the side of a flat bed without using bedrails?: A Lot Help needed moving to and from a bed to a chair (including a wheelchair)?: A Lot Help needed standing up from a chair using your arms (e.g., wheelchair or bedside chair)?: A Lot Help needed to walk in hospital room?: A Lot Help needed climbing 3-5 steps with a railing? : Total 6 Click Score: 12    End of Session Equipment Utilized During Treatment: Gait belt;Oxygen Activity Tolerance: Patient limited by pain Patient left: Other (comment) Nurse Communication: Mobility status PT Visit Diagnosis: Other abnormalities of gait and mobility (R26.89);Muscle weakness (generalized) (M62.81);Unsteadiness on feet (R26.81)      Time: 1400-1410 PT Time Calculation (min) (ACUTE ONLY): 10 min  Charges:  $Therapeutic Activity: 8-22 mins                    Reinaldo Berber, PT, DPT Acute Rehabilitation Services Pager: [phone] Office: [phone]     Reinaldo Berber 02/10/2019, 2:21 PM

## 2019-02-11 ENCOUNTER — Inpatient Hospital Stay (HOSPITAL_COMMUNITY): Payer: Medicaid Other

## 2019-02-11 LAB — CBC
HCT: 25.3 % — ABNORMAL LOW (ref 36.0–46.0)
HCT: 30.3 % — ABNORMAL LOW (ref 36.0–46.0)
Hemoglobin: 7.7 g/dL — ABNORMAL LOW (ref 12.0–15.0)
Hemoglobin: 9.3 g/dL — ABNORMAL LOW (ref 12.0–15.0)
MCH: 27.5 pg (ref 26.0–34.0)
MCH: 27.7 pg (ref 26.0–34.0)
MCHC: 30.4 g/dL (ref 30.0–36.0)
MCHC: 30.7 g/dL (ref 30.0–36.0)
MCV: 90.4 fL (ref 80.0–100.0)
MCV: 90.2 fL (ref 80.0–100.0)
Platelets: 105 10*3/uL — ABNORMAL LOW (ref 150–400)
Platelets: 142 10*3/uL — ABNORMAL LOW (ref 150–400)
RBC: 2.8 MIL/uL — ABNORMAL LOW (ref 3.87–5.11)
RBC: 3.36 MIL/uL — ABNORMAL LOW (ref 3.87–5.11)
RDW: 14.9 % (ref 11.5–15.5)
RDW: 15.2 % (ref 11.5–15.5)
WBC: 3.6 10*3/uL — ABNORMAL LOW (ref 4.0–10.5)
WBC: 6.5 10*3/uL (ref 4.0–10.5)
nRBC: 0 % (ref 0.0–0.2)
nRBC: 0 % (ref 0.0–0.2)

## 2019-02-11 LAB — COMPREHENSIVE METABOLIC PANEL
ALT: <5 U/L (ref 0–44)
AST: 14 U/L — ABNORMAL LOW (ref 15–41)
Albumin: 1.2 g/dL — ABNORMAL LOW (ref 3.5–5.0)
Alkaline Phosphatase: 76 U/L (ref 38–126)
Anion gap: 7 (ref 5–15)
BUN: 61 mg/dL — ABNORMAL HIGH (ref 6–20)
CO2: 27 mmol/L (ref 22–32)
Calcium: 7.6 mg/dL — ABNORMAL LOW (ref 8.9–10.3)
Chloride: 108 mmol/L (ref 98–111)
Creatinine, Ser: 1.78 mg/dL — ABNORMAL HIGH (ref 0.44–1.00)
GFR calc Af Amer: 43 mL/min — ABNORMAL LOW (ref >60)
GFR calc non Af Amer: 37 mL/min — ABNORMAL LOW (ref >60)
Glucose, Bld: 92 mg/dL (ref 70–99)
Potassium: 3.4 mmol/L — ABNORMAL LOW (ref 3.5–5.1)
Sodium: 142 mmol/L (ref 135–145)
Total Bilirubin: 0.4 mg/dL (ref 0.3–1.2)
Total Protein: 7.3 g/dL (ref 6.5–8.1)

## 2019-02-11 LAB — MAGNESIUM
Magnesium: 1.6 mg/dL — ABNORMAL LOW (ref 1.7–2.4)
Magnesium: 2.2 mg/dL (ref 1.7–2.4)

## 2019-02-11 LAB — BASIC METABOLIC PANEL
Anion gap: 10 (ref 5–15)
BUN: 59 mg/dL — ABNORMAL HIGH (ref 6–20)
CO2: 25 mmol/L (ref 22–32)
Calcium: 7.7 mg/dL — ABNORMAL LOW (ref 8.9–10.3)
Chloride: 104 mmol/L (ref 98–111)
Creatinine, Ser: 1.92 mg/dL — ABNORMAL HIGH (ref 0.44–1.00)
GFR calc Af Amer: 40 mL/min — ABNORMAL LOW (ref >60)
GFR calc non Af Amer: 34 mL/min — ABNORMAL LOW (ref >60)
Glucose, Bld: 117 mg/dL — ABNORMAL HIGH (ref 70–99)
Potassium: 3.7 mmol/L (ref 3.5–5.1)
Sodium: 139 mmol/L (ref 135–145)

## 2019-02-11 LAB — POCT I-STAT 7, (LYTES, BLD GAS, ICA,H+H)
Acid-base deficit: 3 mmol/L — ABNORMAL HIGH (ref 0.0–2.0)
Bicarbonate: 22.2 mmol/L (ref 20.0–28.0)
Calcium, Ion: 1.15 mmol/L (ref 1.15–1.40)
HCT: 28 % — ABNORMAL LOW (ref 36.0–46.0)
Hemoglobin: 9.5 g/dL — ABNORMAL LOW (ref 12.0–15.0)
O2 Saturation: 93 %
Patient temperature: 98.4
Potassium: 3.8 mmol/L (ref 3.5–5.1)
Sodium: 144 mmol/L (ref 135–145)
TCO2: 23 mmol/L (ref 22–32)
pCO2 arterial: 38.9 mmHg (ref 32.0–48.0)
pH, Arterial: 7.364 (ref 7.350–7.450)
pO2, Arterial: 70 mmHg — ABNORMAL LOW (ref 83.0–108.0)

## 2019-02-11 LAB — AMMONIA: Ammonia: 13 umol/L (ref 9–35)

## 2019-02-11 LAB — GLUCOSE, CAPILLARY
Glucose-Capillary: 106 mg/dL — ABNORMAL HIGH (ref 70–99)
Glucose-Capillary: 110 mg/dL — ABNORMAL HIGH (ref 70–99)
Glucose-Capillary: 112 mg/dL — ABNORMAL HIGH (ref 70–99)
Glucose-Capillary: 137 mg/dL — ABNORMAL HIGH (ref 70–99)
Glucose-Capillary: 148 mg/dL — ABNORMAL HIGH (ref 70–99)
Glucose-Capillary: 93 mg/dL (ref 70–99)

## 2019-02-11 LAB — PHOSPHORUS
Phosphorus: 3.7 mg/dL (ref 2.5–4.6)
Phosphorus: 3.8 mg/dL (ref 2.5–4.6)

## 2019-02-11 MED ORDER — HYDROMORPHONE HCL 2 MG PO TABS
1.0000 mg | ORAL_TABLET | ORAL | Status: DC | PRN
  Administered 2019-02-11: 1 mg via ORAL
  Administered 2019-02-12: 22:00:00 via ORAL
  Administered 2019-02-13: 1 mg via ORAL
  Filled 2019-02-11 (×4): qty 1

## 2019-02-11 MED ORDER — POTASSIUM CHLORIDE 20 MEQ/15ML (10%) PO SOLN
40.0000 meq | Freq: Once | ORAL | Status: AC
  Administered 2019-02-11: 40 meq
  Filled 2019-02-11: qty 30

## 2019-02-11 MED ORDER — LEVETIRACETAM IN NACL 500 MG/100ML IV SOLN
500.0000 mg | Freq: Two times a day (BID) | INTRAVENOUS | Status: DC
  Administered 2019-02-12 – 2019-02-13 (×3): 500 mg via INTRAVENOUS
  Filled 2019-02-11 (×5): qty 100

## 2019-02-11 MED ORDER — LORAZEPAM 2 MG/ML IJ SOLN
4.0000 mg | INTRAMUSCULAR | Status: DC | PRN
  Administered 2019-02-11: 4 mg via INTRAVENOUS
  Filled 2019-02-11: qty 2

## 2019-02-11 MED ORDER — LORAZEPAM 2 MG/ML IJ SOLN
INTRAMUSCULAR | Status: AC
  Filled 2019-02-11: qty 1

## 2019-02-11 MED ORDER — MAGNESIUM SULFATE 2 GM/50ML IV SOLN
2.0000 g | Freq: Once | INTRAVENOUS | Status: AC
  Administered 2019-02-11: 2 g via INTRAVENOUS
  Filled 2019-02-11: qty 50

## 2019-02-11 MED ORDER — LORAZEPAM 2 MG/ML IJ SOLN
INTRAMUSCULAR | Status: AC
  Administered 2019-02-11: 4 mg via INTRAVENOUS
  Filled 2019-02-11: qty 2

## 2019-02-11 MED ORDER — SODIUM CHLORIDE 0.9 % IV SOLN
20.0000 mg/kg | Freq: Once | INTRAVENOUS | Status: AC
  Administered 2019-02-11: 19:00:00 1090 mg via INTRAVENOUS
  Filled 2019-02-11: qty 20

## 2019-02-11 MED ORDER — LORAZEPAM 2 MG/ML IJ SOLN
4.0000 mg | Freq: Once | INTRAMUSCULAR | Status: AC
  Administered 2019-02-11: 19:00:00 4 mg via INTRAVENOUS

## 2019-02-11 MED ORDER — LEVETIRACETAM IN NACL 1000 MG/100ML IV SOLN
1000.0000 mg | Freq: Once | INTRAVENOUS | Status: AC
  Administered 2019-02-11: 18:00:00 1000 mg via INTRAVENOUS
  Filled 2019-02-11: qty 100

## 2019-02-11 NOTE — Consult Note (Signed)
Neurology Consultation  Reason for Consult: Seizures Referring Physician: Dr. Lynetta Mare  CC: Seizure  History is obtained from: Chart review, CCM team  HPI: Molly [name] is a 33 y.o. female past medical history of IV drug use, polysubstance abuse, who was admitted on 01/20/2019 for altered mental status.  Per chart review, she was found by her brother who came back from out of town and patient was found to be altered by him.  She was cachectic looking with edematous lower legs in particular her ankles and feet at that time.  She also has history of osteomyelitis of spine and MRSA bacteremia along with tricuspid valve endocarditis and septic pulmonary emboli.  Those were partially treated.  ID was planning to start patient on Teflaro in July last year for this-she is allergic to bank, then she left AMA. She was initially evaluated by hospital medicine but deteriorated-became hypotensive and in respiratory distress requiring intubation.  CT chest showed bilateral nodular cavitary lesions consistent with septic emboli from tricuspid source.  She was started on vancomycin and Zosyn and Teflaro.  She was then transferred to [facility] from [facility]. Significant hospital events as listed in the PCCM progress notes copied below "4/04 Admit with hypotension / resp distress, intubated  4/05 Family discussion > concern terminal based on sepsis, AKI, septic emboli pancytopenia  4/10 UOP improving, off pressors, low grade fevers  4/11 Agitated, PRN versed. Awake / FC. Looks best in many days. Failed SBT 4/12 Failed SBT.  AKI improving, tx PRBC.  Less alert on precedex, methadone  4/13 More alert, FC. On precedex. Failed SBT. ID recommending PICC for antibiotcs > mother not sure about goals of care. S/p 1 unit PRBC   4/17 Trach placement 4/21 tolerating trach collar trials. 4/23: cdiff result positive overnight with continued diarrhea"  He has since developed C. difficile colitis, has been treated with  trach collar trial which she was doing well with.  This evening around 5:30 PM, she had one episode of generalized tonic-clonic seizure with gaze deviation which was downward.  This lasted about a few minutes and then she was given Ativan and the seizure broke.  She then had another seizure lasting another few minutes requiring Ativan.  Upon the time that I got to the unit after being called by the CCM attending,  LKW: ~1730 tpa given?: no, PROBABLY SEIZURE Premorbid modified Rankin scale (mRS): 5 (for at least last 3 weeks)  ROS: Unable to obtain due to altered mental status.   Past Medical History:  Diagnosis Date  . 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 Abscess of skin    buttock - most recently 2009/10  . CKD (chronic kidney disease) stage 3, GFR 30-59 ml/min (HCC)   . Drug-seeking behavior   . Hypersplenism   . IV drug user   . MRSA (methicillin resistant Staphylococcus aureus) infection   . MRSA bacteremia   . Pancytopenia ([place])   . 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 Septic pulmonary embolism ([place])   . Tobacco abuse     Family History  Problem Relation Age of Onset  . Alcoholism Father    Social History:   reports that she has been smoking cigarettes. She has a 3.50 pack-year smoking history. She has never used smokeless tobacco. She reports current drug use. Drugs: IV and Oxycodone. She reports that she does not drink alcohol.  Medications  Current Facility-Administered Medications:  .  LORazepam (ATIVAN) 2 MG/ML injection, , , ,  .  0.9 %  sodium chloride infusion, , Intravenous, PRN, Brand Males, MD, Last Rate: 5 mL/hr at 02/05/19 0912, 1,000 mL at 02/05/19 0912 .  acetaminophen (TYLENOL) solution 650 mg, 650 mg, Per Tube, Q6H, Arrien, Jimmy Picket, MD, 650 mg at 02/11/19 1418 .  ceFAZolin (ANCEF) IVPB 2g/100 mL premix, 2 g, Intravenous, Q8H, Audria Nine, DO, Stopped at 02/11/19 1450 .  chlorhexidine gluconate (MEDLINE KIT)  (PERIDEX) 0.12 % solution 15 mL, 15 mL, Mouth Rinse, BID, Shellia Cleverly, MD, 15 mL at 02/11/19 0733 .  Chlorhexidine Gluconate Cloth 2 % PADS 6 each, 6 each, Topical, Q0600, Brand Males, MD, 6 each at 02/10/19 0313 .  Chlorhexidine Gluconate Cloth 2 % PADS 6 each, 6 each, Topical, Daily, Barton Dubois, MD, 6 each at 02/10/19 2143 .  clonazepam (KLONOPIN) disintegrating tablet 0.125 mg, 0.125 mg, Oral, BID PRN, Mannam, Praveen, MD, 0.125 mg at 02/11/19 1608 .  feeding supplement (OSMOLITE 1.5 CAL) liquid 1,000 mL, 1,000 mL, Per Tube, Q24H, Audria Nine, DO, Last Rate: 65 mL/hr at 02/11/19 1300 .  feeding supplement (PRO-STAT SUGAR FREE 64) liquid 30 mL, 30 mL, Per Tube, BID, Agarwala, Ravi, MD, 30 mL at 02/11/19 1023 .  fentaNYL (DURAGESIC) 50 MCG/HR 1 patch, 1 patch, Transdermal, Q72H, Arrien, Jimmy Picket, MD, 1 patch at 02/09/19 [phone] .  free water 100 mL, 100 mL, Per Tube, Q8H, Audria Nine, DO, Stopped at 02/11/19 1419 .  heparin injection 5,000 Units, 5,000 Units, Subcutaneous, Q8H, Agarwala, Ravi, MD, 5,000 Units at 02/11/19 1300 .  HYDROmorphone (DILAUDID) tablet 1 mg, 1 mg, Oral, Q4H PRN, Minor, Grace Bushy, NP, 1 mg at 02/11/19 1034 .  levETIRAcetam (KEPPRA) IVPB 1000 mg/100 mL premix, 1,000 mg, Intravenous, Once **FOLLOWED BY** [START ON 02/12/2019] levETIRAcetam (KEPPRA) IVPB 500 mg/100 mL premix, 500 mg, Intravenous, Q12H, Agarwala, Ravi, MD .  MEDLINE mouth rinse, 15 mL, Mouth Rinse, 10 times per day, Shellia Cleverly, MD, 15 mL at 02/11/19 1418 .  Melatonin TABS 3 mg, 3 mg, Oral, QHS, Agarwala, Ravi, MD, 3 mg at 02/10/19 2307 .  [DISCONTINUED] ondansetron (ZOFRAN) tablet 4 mg, 4 mg, Oral, Q6H PRN **OR** ondansetron (ZOFRAN) injection 4 mg, 4 mg, Intravenous, Q6H PRN, Shellia Cleverly, MD, 4 mg at 02/10/19 2330 .  pantoprazole sodium (PROTONIX) 40 mg/20 mL oral suspension 40 mg, 40 mg, Per Tube, Daily, Brand Males, MD, 40 mg at 02/11/19 1023 .  Resource ThickenUp  Clear, , Oral, PRN, Mannam, Praveen, MD .  sodium chloride (OCEAN) 0.65 % nasal spray 1 spray, 1 spray, Each Nare, PRN, Barton Dubois, MD .  sodium chloride flush (NS) 0.9 % injection 10-40 mL, 10-40 mL, Intracatheter, Q12H, Barton Dubois, MD, 10 mL at 02/11/19 1024 .  sodium chloride flush (NS) 0.9 % injection 10-40 mL, 10-40 mL, Intracatheter, PRN, Barton Dubois, MD .  vancomycin (VANCOCIN) 50 mg/mL oral solution 125 mg, 125 mg, Per Tube, QID, Audria Nine, DO, 125 mg at 02/11/19 1418 .  vitamin C (ASCORBIC ACID) tablet 250 mg, 250 mg, Per NG tube, BID, Collier Bullock, MD, 250 mg at 02/11/19 1023  Exam: Current vital signs: BP (!) 153/118   Pulse 93   Temp 98.4 F (36.9 C) (Oral)   Resp (!) 27   Ht '5\' 1"'  (1.549 m)   Wt 54.5 kg   SpO2 100%   BMI 22.70 kg/m  Vital signs in last 24 hours: Temp:  [98.2 F (36.8 C)-98.8 F (37.1 C)] 98.4 F (36.9 C) (04/26 1616) Pulse  Rate:  [85-105] 93 (04/26 1600) Resp:  [24-40] 27 (04/26 1600) BP: (136-155)/(105-119) 153/118 (04/26 1600) SpO2:  [98 %-100 %] 100 % (04/26 1600) FiO2 (%):  [28 %] 28 % (04/26 1545)  Gen: Cachectic looking woman in no distress, trached HEENT: Normocephalic atraumatic, pale conjunctiva, pale skin, dry mucous membranes CVS: regular rhythm Respiratory: On trach collar, appears in mild respiratory distress Abdomen: Nondistended nontender Neurological exam Patient is completely obtunded.  Does not respond to voice, does not follow any commands. Does not open eyes to voice or noxious stimulation. Cranial nerves: Pupils are 4 mm, reactive to light, gaze is mildly disconjugate, does not blink to threat from either side, face is grossly symmetric, cannot assess auditory acuity. Motor exam: Does not move any of the extremities. Sensory exam: No withdrawal to noxious stimulation Coordination and gait cannot be assessed at this time.  Labs I have reviewed labs in epic and the results pertinent to this  consultation are:  CBC    Component Value Date/Time   WBC 3.6 (L) 02/11/2019 0640   RBC 2.80 (L) 02/11/2019 0640   HGB 7.7 (L) 02/11/2019 0640   HCT 25.3 (L) 02/11/2019 0640   PLT 105 (L) 02/11/2019 0640   MCV 90.4 02/11/2019 0640   MCH 27.5 02/11/2019 0640   MCHC 30.4 02/11/2019 0640   RDW 14.9 02/11/2019 0640   LYMPHSABS 0.5 (L) 02/06/2019 0253   MONOABS 0.2 02/06/2019 0253   EOSABS 0.0 02/06/2019 0253   BASOSABS 0.0 02/06/2019 0253    CMP     Component Value Date/Time   NA 142 02/11/2019 0640   K 3.4 (L) 02/11/2019 0640   CL 108 02/11/2019 0640   CO2 27 02/11/2019 0640   GLUCOSE 92 02/11/2019 0640   BUN 61 (H) 02/11/2019 0640   CREATININE 1.78 (H) 02/11/2019 0640   CREATININE 0.97 11/02/2016 1658   CALCIUM 7.6 (L) 02/11/2019 0640   PROT 7.3 02/11/2019 0640   ALBUMIN 1.2 (L) 02/11/2019 0640   AST 14 (L) 02/11/2019 0640   ALT <5 02/11/2019 0640   ALKPHOS 76 02/11/2019 0640   BILITOT 0.4 02/11/2019 0640   GFRNONAA 37 (L) 02/11/2019 0640   GFRAA 43 (L) 02/11/2019 0640   Imaging I have reviewed the images obtained: CT-scan of the brain-suggestive of posterior reversible encephalopathy syndrome due to bilaterally symmetrical occipital hypodensities.  Cannot rule out infection but low on differential  Assessment: 33 year old past history of IV drug use, polysubstance abuse admitted for altered mental status.  Also has history of MRSA bacteremia, septic pulmonary emboli and tricuspid valve endocarditis along with osteomyelitis of spine.  Noncompliant to medication. She was recovering in the medical ICU after having been intubated and had a tracheostomy after prolonged intubation and now is on a trach collar getting ready to be transferred to the floor and then to rehab when she had at least 2 episodes of seizures. On my exam, she was very postictal and the exam was poor.  I repeated my exam around 8 PM and she was opening eyes to noxious stimulation, her gaze was midline  and there was mild withdrawal on upper extremities bilaterally.  Not much response in the lower extremity.  Likely seizure/status epilepticus-resolved Imaging suggestive of posterior reversible encephalopathy syndrome.  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 Load with Keppra-1000 mg IV x1. Start Keppra 500 twice daily  Benzos as needed for breakthrough seizure If her exam does not improve, I will have the EEG technologist come in and hooked her up for a long-term EEG to rule out underlying status epilepticus. I have discussed my plan with Dr. Lynetta Mare on the ICU unit. Neurology will continue to follow.  -- Amie Portland, MD [facility] Pager: [phone] If 7pm to 7am, please call on call as listed on AMION.  Neurological exam at 8 PM Patient remains obtunded. Does not open eyes to voice Opens eyes to noxious simulation Gaze midline Corneals and oculocephalics present Minimal withdrawal to noxious stimulation on the upper extremities    Addendum My repeat exam as documented above at 8 PM Continue Keppra 500 twice daily Use benzos for breakthrough seizure Stat MRI I have communicated the plan to the RN at bedside I will sign out her care to my oncoming colleague overnight will follow up on the imaging. If concern persists for ongoing seizure activity or if she does not return to baseline, which are supposed would be after a prolonged time as she is received multiple high doses of Ativan-4 mg x 2-I would consider an EEG then.  Otherwise EEG in the morning. Neurology will continue to follow with you.  -- Amie Portland, MD [facility] Pager: [phone] If 7pm to 7am, please call on call as listed on AMION.  CRITICAL CARE ATTESTATION Performed by: Amie Portland, MD Total  critical care time: 70 minutes Critical care time was exclusive of separately billable procedures and treating other patients and/or supervising APPs/Residents/Students Critical care was necessary to treat or prevent imminent or life-threatening deterioration due to seizure, status epilepticus, posterior reversible encephalopathy syndrome This patient is critically ill and at significant risk for neurological worsening and/or death and care requires constant monitoring. Critical care was time spent personally by me on the following activities: development of treatment plan with patient and/or surrogate as well as nursing, discussions with consultants, evaluation of patient's response to treatment, examination of patient, obtaining history from patient or surrogate, ordering and performing treatments and interventions, ordering and review of laboratory studies, ordering and review of radiographic studies, pulse oximetry, re-evaluation of patient's condition, participation in multidisciplinary rounds and medical decision making of high complexity in the care of this patient.

## 2019-02-11 NOTE — Progress Notes (Signed)
NAME:  Molly [name], MRN:  [id number], DOB:  1985/10/29, LOS: 59 ADMISSION DATE:  01/20/2019, CONSULTATION DATE: 01/20/2019 REFERRING MD:  Bonner Puna, CHIEF COMPLAINT:  Sepsis  Brief History   33 y/o F with a history of IV drug abuse/polysubstance abuse (presented with urine drug screen positive for opiates and amphetamines) and a previous history in June or July 2019 of inadequately treated MRSA endocarditis having signed out AMA at that time.  She had a tricuspid vegetation at that time.  Admitted 4/4 hypotensive and in respiratory distress requiring intubation at [facility] long.   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 Awake / FC. Looks best in many days. Failed SBT 4/12 Failed SBT.  AKI improving, tx PRBC.  Less alert on precedex, methadone  4/13 More alert, FC. On precedex. Failed SBT. ID recommending PICC for antibiotcs > mother not sure about goals of care. S/p 1 unit PRBC   4/17 Trach placement 4/21 tolerating trach collar trials. 4/23: cdiff result positive overnight with continued diarrhea  Consults:  Nephrology  Procedures:  ETT 4/4 >> 4/17 R Femoral line 4/4 >> 4/8 R IJ Tunneled CVC (IR) 4/21 >>  Trach 4/17   Significant Diagnostic Tests:  Echocardiogram 4/4 >> TV vegetation measures 1.5 mm x 0.6 mm., severe TR CT Chest 4/4 >> bilateral pulmonary nodules, peripheral and cavitated.  LE venous  duplex 4/20: negative  Micro Data:  Blood cultures x2 4/4 >> MSSA  BCx2 4/8 >> negative    Antimicrobials:  Vancomycin Zosyn Teflaro >> stopped Ancef 4/4 >>5/19 Diflucan 4/21->4/24 Vancomycin po 4/23-> 5/2  Interim history/subjective:  Currently 24 hours off mechanical ventilatory support.  As usual she requests IV pain medication.  Objective   Blood pressure (!) 136/109, pulse 93, temperature 98.7 F (37.1 C), temperature source Oral, resp. rate (!) 31, height 5\' 1"  (1.549 m), weight 54.5 kg, SpO2 99 %.    FiO2 (%):  [28 %] 28 %   Intake/Output Summary (Last 24 hours) at 02/11/2019 4098 Last data filed at 02/11/2019 0800 Gross per 24 hour  Intake 2673.8 ml  Output 2350 ml  Net 323.8 ml   Filed Weights   02/08/19 0320 02/09/19 0500 02/10/19 0345  Weight: 55.7 kg 53.9 kg 54.5 kg    General: Frail cachectic female to request pain medication HEENT: Tracheostomy in place unremarkable Neuro: Grossly intact CV: s1s2 rrr, no m/r/g PULM: even/non-labored, lungs bilaterally diminished JX:BJYN, non-tender, bsx4 active  Extremities: warm/dry, negative edema  Skin: no rashes or lesions    LABS    PULMONARY No results for input(s): PHART, PCO2ART, PO2ART, HCO3, TCO2, O2SAT in the last 168 hours.  Invalid input(s): PCO2, PO2  CBC Recent Labs  Lab 02/09/19 0602 02/10/19 0524 02/11/19 0640  HGB 7.4* 7.2* 7.7*  HCT 24.1* 23.4* 25.3*  WBC 3.0* 2.7* 3.6*  PLT 78* 81* 105*    COAGULATION No results for input(s): INR in the last 168 hours.  CARDIAC  No  results for input(s): TROPONINI in the last 168 hours. No results for input(s): PROBNP in the last 168 hours.   CHEMISTRY Recent Labs  Lab 02/06/19 0253 02/07/19 0332 02/07/19 1236 02/08/19 0451 02/09/19 0602 02/10/19 0524 02/11/19 0640  NA 149* 147*  --  149* 145 142 142  K 3.3* 3.0* 3.9 3.3* 3.7 3.2* 3.4*  CL 110 109  --  112* 109 107 108  CO2 28 27  --  29 28 26 27   GLUCOSE 125* 111*  --  97 98 99 92   BUN 118* 108*  --  99* 87* 71* 61*  CREATININE 2.54* 2.23*  --  2.04* 1.98* 1.98* 1.78*  CALCIUM 7.6* 7.6*  --  7.8* 7.9* 7.7* 7.6*  MG 2.0  --  1.8  --  1.7 1.6* 1.6*  PHOS  --   --   --   --  3.2 3.3 3.7   Estimated Creatinine Clearance: 34.6 mL/min (A) (by C-G formula based on SCr of 1.78 mg/dL (H)).   LIVER Recent Labs  Lab 02/05/19 0418 02/09/19 0602 02/10/19 0524 02/11/19 0640  AST 14* 14* 13* 14*  ALT <5 <5 <5 <5  ALKPHOS 76 72 74 76  BILITOT 0.5 0.5 0.5 0.4  PROT 7.3 7.1 6.8 7.3  ALBUMIN 1.3* 1.3* 1.2* 1.2*     INFECTIOUS No results for input(s): LATICACIDVEN, PROCALCITON in the last 168 hours.   ENDOCRINE CBG (last 3)  Recent Labs    02/11/19 0056 02/11/19 0448 02/11/19 0814  GLUCAP 93 137* 148*    IMAGING   No results found.  Resolved Hospital Problem list   Septic Shock   Active Problem list    Acute Hypoxemic Respiratory Failure secondary to MSSA Bacteremia, Septic Shock in the setting of IVDA 02/11/2019 she is 24 hours off ventilatory support.  Does report that when she receives her PRN Dilaudid she becomes sleepy and has difficulty tolerating the trach collar.  Therefore we have decreased the frequency of her Dilaudid to every 4 hours for every 2 as needed and we have scheduled for her to stop after 5 more doses.  Continue with Duragesic patch. When she is 48 hours off full ventilatory support stable she can transfer to progressive care unit.  She would then be transitioned to [facility] pulmonary critical care will follow for trach management   Infective Endocarditis in setting of MSSA Bacteremia   Severe Tricuspid Regurgitation  Acute Diastolic CHF  As noted for Ancef duration Her fever curve and white count  MSSA bacteremia CDiff infection Oral thrush P: Ancef and date is 03/06/2019 02/08/2019 p.o. vancomycin for 10 days    AKI on CKD  Hypernatremia  Hypokalemia  Lab Results  Component Value Date   CREATININE 1.78  (H) 02/11/2019   CREATININE 1.98 (H) 02/10/2019   CREATININE 1.98 (H) 02/09/2019   CREATININE 0.97 11/02/2016   CREATININE 0.79 08/24/2016   Recent Labs  Lab 02/09/19 0602 02/10/19 0524 02/11/19 0640  K 3.7 3.2* 3.4*   Recent Labs  Lab 02/09/19 0602 02/10/19 0524 02/11/19 0640  NA 145 142 142    Continue free water Monitor creatinine Replete electrolytes as needed  Severe Protein Calorie Malnutrition   tube feeding per nutrition's during nocturnal Continue to eat as tolerated  Anemia  Recent Labs    02/10/19 0524 02/11/19 0640  HGB 7.2* 7.7*    Transfuse per protocol   Thrombocytopenia   continue to monitor   Anxiety Chronic pain:  02/11/2019 Dilaudid frequency is been changed from 2 hours to every 4 hours.  Also a limit of 5 doses been placed on the Dilaudid.  Which time will be discontinued.  She was informed of this on 02/11/2019 and states she understands and okay with this plan. Note she also has a Duragesic patch in place this will be left on. Continue low-dose Klonopin for now  Best practice:  Diet: nocturnalTF and po Pain/Anxiety/Delirium protocol (if indicated): klonopin and dialudid and fentanyl patch VAP protocol (if indicated): in place DVT prophylaxis: scd GI prophylaxis: PPI Glucose control: protocol Mobility: as tolerated Code Status: Limited code Family Communication: Family updated on 64 via teleconference. Disposition: ICU 02/11/2019 will be ready for stepdown unit within 24 hours.  CRITICAL CARE APP CCT 30 min   Richardson Landry  ACNP Maryanna Shape PCCM Pager [phone] till 1 pm If no answer page [phone] 02/11/2019, 9:22 AM

## 2019-02-11 NOTE — Progress Notes (Signed)
Patient having tonic clonic seizure like activity 1722-1725. Agarwala, MD paged- No new prn orders placed at this time as seizure activity stopped.   Patient again having tonic clonic seizure like activity 1731-1737. Agarwala, MD paged. 4mg  Ativan verbal order. 4mg  Ativan given at 1736. Lynetta Mare, MD and Rory Percy, MD at bedside.  Donnetta Simpers, RN

## 2019-02-11 NOTE — Progress Notes (Signed)
Minor, NP, notified of hypertensive pressures. No new orders at this time. Will continue to monitor.   Donnetta Simpers, RN

## 2019-02-12 ENCOUNTER — Inpatient Hospital Stay (HOSPITAL_COMMUNITY): Payer: Medicaid Other

## 2019-02-12 DIAGNOSIS — R569 Unspecified convulsions: Secondary | ICD-10-CM

## 2019-02-12 DIAGNOSIS — I6783 Posterior reversible encephalopathy syndrome: Secondary | ICD-10-CM

## 2019-02-12 LAB — CBC WITH DIFFERENTIAL/PLATELET
Abs Immature Granulocytes: 0.04 10*3/uL (ref 0.00–0.07)
Basophils Absolute: 0 10*3/uL (ref 0.0–0.1)
Basophils Relative: 0 %
Eosinophils Absolute: 0.1 10*3/uL (ref 0.0–0.5)
Eosinophils Relative: 3 %
HCT: 25 % — ABNORMAL LOW (ref 36.0–46.0)
Hemoglobin: 8 g/dL — ABNORMAL LOW (ref 12.0–15.0)
Immature Granulocytes: 1 %
Lymphocytes Relative: 16 %
Lymphs Abs: 0.9 10*3/uL (ref 0.7–4.0)
MCH: 28.1 pg (ref 26.0–34.0)
MCHC: 32 g/dL (ref 30.0–36.0)
MCV: 87.7 fL (ref 80.0–100.0)
Monocytes Absolute: 0.4 10*3/uL (ref 0.1–1.0)
Monocytes Relative: 7 %
Neutro Abs: 4.1 10*3/uL (ref 1.7–7.7)
Neutrophils Relative %: 73 %
Platelets: 131 10*3/uL — ABNORMAL LOW (ref 150–400)
RBC: 2.85 MIL/uL — ABNORMAL LOW (ref 3.87–5.11)
RDW: 15.3 % (ref 11.5–15.5)
WBC: 5.6 10*3/uL (ref 4.0–10.5)
nRBC: 0 % (ref 0.0–0.2)

## 2019-02-12 LAB — GLUCOSE, CAPILLARY
Glucose-Capillary: 111 mg/dL — ABNORMAL HIGH (ref 70–99)
Glucose-Capillary: 132 mg/dL — ABNORMAL HIGH (ref 70–99)
Glucose-Capillary: 135 mg/dL — ABNORMAL HIGH (ref 70–99)
Glucose-Capillary: 85 mg/dL (ref 70–99)
Glucose-Capillary: 92 mg/dL (ref 70–99)
Glucose-Capillary: 94 mg/dL (ref 70–99)
Glucose-Capillary: 96 mg/dL (ref 70–99)

## 2019-02-12 LAB — BASIC METABOLIC PANEL
Anion gap: 8 (ref 5–15)
BUN: 57 mg/dL — ABNORMAL HIGH (ref 6–20)
CO2: 25 mmol/L (ref 22–32)
Calcium: 7.7 mg/dL — ABNORMAL LOW (ref 8.9–10.3)
Chloride: 108 mmol/L (ref 98–111)
Creatinine, Ser: 1.93 mg/dL — ABNORMAL HIGH (ref 0.44–1.00)
GFR calc Af Amer: 39 mL/min — ABNORMAL LOW (ref >60)
GFR calc non Af Amer: 34 mL/min — ABNORMAL LOW (ref >60)
Glucose, Bld: 102 mg/dL — ABNORMAL HIGH (ref 70–99)
Potassium: 3.3 mmol/L — ABNORMAL LOW (ref 3.5–5.1)
Sodium: 141 mmol/L (ref 135–145)

## 2019-02-12 LAB — MAGNESIUM: Magnesium: 2.1 mg/dL (ref 1.7–2.4)

## 2019-02-12 MED ORDER — METOPROLOL TARTRATE 25 MG/10 ML ORAL SUSPENSION
12.5000 mg | Freq: Two times a day (BID) | ORAL | Status: DC
  Administered 2019-02-12 – 2019-02-26 (×30): 12.5 mg
  Filled 2019-02-12 (×29): qty 10

## 2019-02-12 MED ORDER — POTASSIUM CHLORIDE 20 MEQ/15ML (10%) PO SOLN
40.0000 meq | ORAL | Status: AC
  Administered 2019-02-12 (×3): 40 meq
  Filled 2019-02-12 (×3): qty 30

## 2019-02-12 NOTE — Progress Notes (Signed)
Uneventful transport back from MRI with RN and hospital transporter. Pt mother called to request update from neurologist.

## 2019-02-12 NOTE — Progress Notes (Signed)
PT Cancellation Note  Patient Details Name: Molly [name] MRN: [id number] DOB: Sep 10, 1986   Cancelled Treatment:    Reason Eval/Treat Not Completed: Medical issues which prohibited therapy. Pt with seizures yesterday. OT ordered discontinued by MD. Please re-order as appropriate.   Shary Decamp [facility] 02/12/2019, 11:36 AM  [place] Pager [phone] Office [phone]

## 2019-02-12 NOTE — Progress Notes (Signed)
Pt transported to MRI on cardiac monitor trach collar with RN and hospital transporter.

## 2019-02-12 NOTE — Progress Notes (Signed)
Reason for consult: Status epilepticus  Subjective: Patient awake and following commands.  Appears to be in small discomfort.  Does not track to examiner.  Attempts to verbalize.   ROS: Unable to obtain due to poor mental status  Examination  Vital signs in last 24 hours: Temp:  [97.7 F (36.5 C)-98.8 F (37.1 C)] 98.4 F (36.9 C) (04/27 0800) Pulse Rate:  [85-115] 104 (04/27 0900) Resp:  [24-40] 34 (04/27 0900) BP: (136-156)/(108-130) 139/109 (04/27 0900) SpO2:  [96 %-100 %] 99 % (04/27 0900) FiO2 (%):  [28 %] 28 % (04/27 0820)  General: lying in bed s/p tracheostomy CVS: pulse-normal rate and rhythm RS: breathing comfortably Extremities: normal   Neuro: MS: Alert, following commands such as thumbs up, opening and closing eyes.  Patient is not tracking examiner. CN: pupils equal and reactive, no fields: Impaired blink to threat bilaterally.  Extraocular movements appear to be intact.  Face is symmetric. Motor: Moves all 4 extremities spontaneously with equal strength Plantars: flexor Coordination: Unable to test Gait: Unable to test  Basic Metabolic Panel: Recent Labs  Lab 02/09/19 0602 02/10/19 0524 02/11/19 0640 02/11/19 1753 02/11/19 1800 02/12/19 0345  NA 145 142 142 144 139 141  K 3.7 3.2* 3.4* 3.8 3.7 3.3*  CL 109 107 108  --  104 108  CO2 28 26 27   --  25 25  GLUCOSE 98 99 92  --  117* 102*  BUN 87* 71* 61*  --  59* 57*  CREATININE 1.98* 1.98* 1.78*  --  1.92* 1.93*  CALCIUM 7.9* 7.7* 7.6*  --  7.7* 7.7*  MG 1.7 1.6* 1.6*  --  2.2 2.1  PHOS 3.2 3.3 3.7  --  3.8  --     CBC: Recent Labs  Lab 02/06/19 0253  02/09/19 0602 02/10/19 0524 02/11/19 0640 02/11/19 1753 02/11/19 1800 02/12/19 0345  WBC 2.9*   < > 3.0* 2.7* 3.6*  --  6.5 5.6  NEUTROABS 2.2  --   --   --   --   --   --  4.1  HGB 8.3*   < > 7.4* 7.2* 7.7* 9.5* 9.3* 8.0*  HCT 27.2*   < > 24.1* 23.4* 25.3* 28.0* 30.3* 25.0*  MCV 93.8   < > 91.6 90.7 90.4  --  90.2 87.7  PLT 95*   < > 78*  81* 105*  --  142* 131*   < > = values in this interval not displayed.     Coagulation Studies: No results for input(s): LABPROT, INR in the last 72 hours.  Imaging Reviewed:   MRI brain: No DWI deficits, FLAIR changes most prominent over bilateral posterior temporal and occipital lobes and cerebellum as well as white matter frontal lobe changes- Consistent with PRES  ASSESSMENT AND PLAN 33 year old female with IV drug abuse and MRSA endocarditis, sacral osteomyelitis, C. difficile consulted for seizure-like activity.  She had 2 seizures yesterday there was generalized tonic-clonic with gaze deviation with a return to baseline.  She was loaded with Keppra and has not had any further seizures.  A stat CT head was performed which was concerning for PRES.  Today patient is awake and following commands.  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  MRI brain reviewed-no septic emboli.  Consistent with PRES. Etiology unclear, patient did have marginally high diastolic blood pressure readings.    Posterior reversible encephalopathy syndrome Status epilepticus-resolved MRSA bacteremia/endocarditis  Recommendations -Routine EEG performed: Results pending -Continue Keppra  500 mg twice daily -BP goal less than 140/90 mmHg -Avoid antiplatelets -Continue to treat bacteremia     This patient is neurologically critically ill due to seizures/PRES. Shee is at risk for significant risk of neurological worsening from cerebral edema, heart failure, hemorrhagic conversion, infection, respiratory failure and worseing seizures. This patient's care requires constant monitoring of vital signs, hemodynamics, respiratory and cardiac monitoring, review of multiple databases, neurological assessment, discussion with family, other specialists and medical decision making of high complexity.  I spent 35  minutes of neurocritical time in the care of this patient.     Karena Addison Aroor [facility] Pager Number [phone] For questions after 7pm please refer to AMION to reach the Neurologist on call

## 2019-02-12 NOTE — Procedures (Signed)
[name], MD     [email or web address]           HISTORY: This is a 33 year old female who presents with confusion and unresponsiveness.  There is suspicion that the patient may be having nonconvulsive seizures and hence the study.  MEDICATIONS:  Current Facility-Administered Medications:  .  0.9 %  sodium chloride infusion, , Intravenous, PRN, Brand Males, MD, Last Rate: 5 mL/hr at 02/05/19 0912, 1,000 mL at 02/05/19 0912 .  acetaminophen (TYLENOL) solution 650 mg, 650 mg, Per Tube, Q6H, Arrien, Jimmy Picket, MD, 650 mg at 02/12/19 1320 .  ceFAZolin (ANCEF) IVPB 2g/100 mL premix, 2 g, Intravenous, Q8H, Audria Nine, DO, Last Rate: 200 mL/hr at 02/12/19 1445, 2 g at 02/12/19 1445 .  chlorhexidine gluconate (MEDLINE KIT) (PERIDEX) 0.12 % solution 15 mL, 15 mL, Mouth Rinse, BID, Shellia Cleverly, MD, 15 mL at 02/12/19 0759 .  Chlorhexidine Gluconate Cloth 2 % PADS 6 each, 6 each, Topical, Daily, Barton Dubois, MD, 6 each at 02/10/19 2143 .  clonazepam (KLONOPIN) disintegrating tablet 0.125 mg, 0.125 mg, Oral, BID PRN, Mannam, Praveen, MD, 0.125 mg at 02/11/19 1608 .  feeding supplement (OSMOLITE 1.5 CAL) liquid 1,000 mL, 1,000 mL, Per Tube, Q24H, Audria Nine, DO, Last Rate: 65 mL/hr at 02/12/19 1500 .  feeding supplement (PRO-STAT SUGAR FREE 64) liquid 30 mL, 30 mL, Per Tube, BID, Agarwala, Ravi, MD, 30 mL at 02/12/19 1015 .  fentaNYL (DURAGESIC) 50 MCG/HR 1 patch, 1 patch, Transdermal, Q72H, Arrien, Jimmy Picket, MD, 1 patch at 02/12/19 1015 .  free water 100 mL, 100 mL, Per Tube, Q8H, Audria Nine, DO, Stopped at 02/11/19 1419 .  heparin injection 5,000 Units, 5,000 Units, Subcutaneous, Q8H, Kipp Brood, MD, 5,000 Units at 02/11/19 2110 .  HYDROmorphone (DILAUDID) tablet 1 mg, 1 mg, Oral, Q4H PRN, Minor, Grace Bushy, NP, 1 mg at 02/11/19 1034 .  [COMPLETED] levETIRAcetam (KEPPRA) IVPB 1000 mg/100 mL premix, 1,000 mg, Intravenous,  Once, Last Rate: 400 mL/hr at 02/11/19 1752 **FOLLOWED BY** levETIRAcetam (KEPPRA) IVPB 500 mg/100 mL premix, 500 mg, Intravenous, Q12H, Kipp Brood, MD, Stopped at 02/12/19 0650 .  MEDLINE mouth rinse, 15 mL, Mouth Rinse, 10 times per day, Shellia Cleverly, MD, 15 mL at 02/12/19 1452 .  Melatonin TABS 3 mg, 3 mg, Oral, QHS, Agarwala, Ravi, MD, 3 mg at 02/10/19 2307 .  metoprolol tartrate (LOPRESSOR) 25 mg/10 mL oral suspension 12.5 mg, 12.5 mg, Per Tube, BID, Chesley Mires, MD, 12.5 mg at 02/12/19 1015 .  [DISCONTINUED] ondansetron (ZOFRAN) tablet 4 mg, 4 mg, Oral, Q6H PRN **OR** ondansetron (ZOFRAN) injection 4 mg, 4 mg, Intravenous, Q6H PRN, Shellia Cleverly, MD, 4 mg at 02/11/19 2027 .  pantoprazole sodium (PROTONIX) 40 mg/20 mL oral suspension 40 mg, 40 mg, Per Tube, Daily, Brand Males, MD, 40 mg at 02/12/19 1015 .  potassium chloride 20 MEQ/15ML (10%) solution 40 mEq, 40 mEq, Per Tube, Q4H, Sood, Vineet, MD, 40 mEq at 02/12/19 1320 .  Resource ThickenUp Clear, , Oral, PRN, Mannam, Praveen, MD .  sodium chloride (OCEAN) 0.65 % nasal spray 1 spray, 1 spray, Each Nare, PRN, Barton Dubois, MD .  sodium chloride flush (NS) 0.9 % injection 10-40 mL, 10-40 mL, Intracatheter, Q12H, Barton Dubois, MD, 10 mL at 02/12/19 1016 .  sodium chloride flush (NS) 0.9 % injection 10-40 mL, 10-40 mL, Intracatheter, PRN, Barton Dubois, MD .  vancomycin (VANCOCIN) 50 mg/mL oral solution 125 mg, 125 mg, Per Tube, QID,  Audria Nine, DO, 125 mg at 02/12/19 1446 .  vitamin C (ASCORBIC ACID) tablet 250 mg, 250 mg, Per NG tube, BID, Collier Bullock, MD, 250 mg at 02/12/19 1015     ANALYSIS: A 16 channel recording using standard 10 20 measurements is conducted for 25 minutes.  The background activity is mostly in the theta range and gets as high as 7 Hz.  There is some beta activity observed in the frontal areas.  There is mostly drowsy activities seen which transitions to spindles and K complexes seen  throughout most of recording indicating stage II non-REM sleep.  Photic stimulation and hyperventilation are not carried out.  There is no focal or lateral slowing.  There is no epileptiform activity is observed.  Infrequent vertex sharp waves are observed.   IMPRESSION: 1.  This recording of mostly the sleep state shows mild global slowing indicating a mild global encephalopathy.  However, no epileptiform discharges are observed.      Molly [name] A. Merlene Laughter, M.D.  Diplomate, Tax adviser of Psychiatry and Neurology ( Neurology).

## 2019-02-12 NOTE — Progress Notes (Signed)
EEG complete - results pending 

## 2019-02-12 NOTE — Progress Notes (Signed)
Spoke with pt's mother over the phone and updated about treatment plan.  Chesley Mires, MD [facility] Pulmonary/Critical Care 02/12/2019, 12:56 PM

## 2019-02-12 NOTE — Progress Notes (Signed)
RT note: patient resting comfortably on 28% ATC this AM with no distress noted.  Will continue to monitor.

## 2019-02-12 NOTE — Progress Notes (Signed)
OT Cancellation Note  Patient Details Name: Molly [name] MRN: [id number] DOB: 06-07-1986   Cancelled Treatment:    Reason Eval/Treat Not Completed: MD discontinued OT order, will await reorder as appropriate. Pt with seizures yesterday.   Malka So 02/12/2019, 12:53 PM  Nestor Lewandowsky, OTR/L Acute Rehabilitation Services Pager: [phone] Office: [phone]

## 2019-02-12 NOTE — Consult Note (Signed)
WOC consulted for toes and spinal wound. Discussed with bedside nurse. She has appropriate topical treatment at this time per the skin care order set. No other needs.   Discussed POC with patient and bedside nurse.  Re consult if needed, will not follow at this time. Thanks  Yesli Vanderhoff [employer], RN,CWOCN, CNS, [place] [phone])

## 2019-02-12 NOTE — Progress Notes (Signed)
NAME:  Molly [name], MRN:  [id number], DOB:  1986/04/03, LOS: 35 ADMISSION DATE:  01/20/2019, CONSULTATION DATE: 01/20/2019 REFERRING MD:  Bonner Puna, CHIEF COMPLAINT:  Sepsis  Brief History   33 y/o F with a history of IV drug abuse/polysubstance abuse (presented with urine drug screen positive for opiates and amphetamines) and a previous history in June or July 2019 of inadequately treated MRSA endocarditis having signed out AMA at that time.  She had a tricuspid vegetation at that time.  Admitted 4/4 hypotensive and in respiratory distress requiring intubation at [facility] long.   Admitted for acute respiratory failure/septic shock/ AKI due to septic emboli with MSSA bacteremia.  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 Awake / FC. Looks best in many days. Failed SBT 4/12 Failed SBT.  AKI improving, tx PRBC.  Less alert on precedex, methadone  4/13 More alert, FC. On precedex. Failed SBT. ID recommending PICC for antibiotcs > mother not sure about goals of care. S/p 1 unit PRBC   4/17 Trach placement 4/21 tolerating trach collar trials. 4/23 cdiff result positive overnight with continued diarrhea 4/26 seizure  Consults:  Nephrology Neurology  Procedures:  ETT 4/4 >> 4/17 R Femoral line 4/4 >> 4/8 R IJ Tunneled CVC (IR) 4/21 >>  Trach 4/17 >>  Significant Diagnostic Tests:  Echocardiogram 4/4 >> TV vegetation measures 1.5 mm x 0.6 mm., severe TR CT Chest 4/4 >> bilateral pulmonary nodules, peripheral  and cavitated.  LE venous duplex 4/20 >> negative MRI brain 4/26 >> scattered T2/FLAIR most in parieto-occipital region b/l EEG 4/27 >>  Micro Data:  Blood cultures x2 4/4 >> MSSA  BCx2 4/8 >> negative   C diff 4/22 >> antigen positive, toxin negative, PCR positive  Antimicrobials:  Vancomycin Zosyn Teflaro >> stopped Ancef 4/4 >>  Diflucan 4/21 >> 4/24 Vancomycin po 4/23 >>   Interim history/subjective:  Had seizure last night.  Objective   Blood pressure (!) 149/130, pulse (!) 107, temperature 98.4 F (36.9 C), temperature source Oral, resp. rate (!) 29, height 5\' 1"  (1.549 m), weight 54.5 kg, SpO2 96 %.    FiO2 (%):  [28 %] 28 %   Intake/Output Summary (Last 24 hours) at 02/12/2019 0857 Last data filed at 02/12/2019 0800 Gross per 24 hour  Intake 1177.42 ml  Output 2300 ml  Net -1122.58 ml   Filed Weights   02/08/19 0320 02/09/19 0500 02/10/19 0345  Weight: 55.7 kg 53.9 kg 54.5 kg    General - somnolent Eyes - pupils reactive ENT - trach site clean Cardiac - regular, 2/6 SM Chest - equal breath sounds b/l, no wheezing or rales Abdomen - soft, non tender, + bowel sounds Extremities - no cyanosis, clubbing, or edema Skin - no rashes Neuro - opens eyes with stimulation  Resolved Hospital Problem list   Septic Shock, thrush, AKI  Active Problem list    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 Chronic pain, anxiety. Plan - AEDs per neurology  Hypertension. Plan - add lopressor 4/27  MSSA bacteremia with  TV endocarditis. Plan - continue ancef until 5/12  C diff colitis. Plan - continue enteral vancomycin until 5/02  Tracheostomy status. Plan - f/u with speech therapy - trach care  Severe protein calorie malnutrition. Dysphagia. Plan - tube feeds - f/u with speech therapy  Hypokalemia. Plan - replace as needed  Anemia of critical illness. Plan - f/u CBC - transfuse for Hb < 7  Best practice:  Diet: tube feeds DVT prophylaxis:  SQ heparin GI prophylaxis: protonix Mobility: bed rest Code Status: Limited code Disposition: ICU  Labs:   CMP Latest Ref Rng & Units 02/12/2019 02/11/2019 02/11/2019  Glucose 70 - 99 mg/dL 102(H) 117(H) -  BUN 6 - 20 mg/dL 57(H) 59(H) -  Creatinine 0.44 - 1.00 mg/dL 1.93(H) 1.92(H) -  Sodium 135 - 145 mmol/L 141 139 144  Potassium 3.5 - 5.1 mmol/L 3.3(L) 3.7 3.8  Chloride 98 - 111 mmol/L 108 104 -  CO2 22 - 32 mmol/L 25 25 -  Calcium 8.9 - 10.3 mg/dL 7.7(L) 7.7(L) -  Total Protein 6.5 - 8.1 g/dL - - -  Total Bilirubin 0.3 - 1.2 mg/dL - - -  Alkaline Phos 38 - 126 U/L - - -  AST 15 - 41 U/L - - -  ALT 0 - 44 U/L - - -   CBC Latest Ref Rng & Units 02/12/2019 02/11/2019 02/11/2019  WBC 4.0 - 10.5 K/uL 5.6 6.5 -  Hemoglobin 12.0 - 15.0 g/dL 8.0(L) 9.3(L) 9.5(L)  Hematocrit 36.0 - 46.0 % 25.0(L) 30.3(L) 28.0(L)  Platelets 150 - 400 K/uL 131(L) 142(L) -   ABG    Component Value Date/Time   PHART 7.364 02/11/2019 1753   PCO2ART 38.9 02/11/2019 1753   PO2ART 70.0 (L) 02/11/2019 1753   HCO3 22.2 02/11/2019 1753   TCO2 23 02/11/2019 1753   ACIDBASEDEF 3.0 (H) 02/11/2019 1753   O2SAT 93.0 02/11/2019 1753   CBG (last 3)  Recent Labs    02/12/19 0044 02/12/19 0429 02/12/19 0730  GLUCAP 111* 96 85     CC time 32 minutes  Chesley Mires, MD [place] 02/12/2019, 9:07 AM

## 2019-02-12 NOTE — Progress Notes (Signed)
SLP Cancellation Note  Patient Details Name: Molly [name] MRN: [id number] DOB: 10/01/1986   Cancelled treatment:       Reason Eval/Treat Not Completed: Medical issues which prohibited therapy. Pt with seizures yesterday. MRI shows PRES. Per RN, pt is not back to baseline, has not been taking any POs today. Will f/u as able.   Venita Sheffield Lyllie Cobbins 02/12/2019, 3:40 PM  [name], M.A. [place] Acute Environmental education officer [phone] Office [phone]

## 2019-02-13 ENCOUNTER — Inpatient Hospital Stay (HOSPITAL_COMMUNITY): Payer: Medicaid Other

## 2019-02-13 DIAGNOSIS — Z515 Encounter for palliative care: Secondary | ICD-10-CM

## 2019-02-13 DIAGNOSIS — G40901 Epilepsy, unspecified, not intractable, with status epilepticus: Secondary | ICD-10-CM

## 2019-02-13 LAB — CBC
HCT: 27.5 % — ABNORMAL LOW (ref 36.0–46.0)
Hemoglobin: 8.6 g/dL — ABNORMAL LOW (ref 12.0–15.0)
MCH: 27.8 pg (ref 26.0–34.0)
MCHC: 31.3 g/dL (ref 30.0–36.0)
MCV: 89 fL (ref 80.0–100.0)
Platelets: 147 10*3/uL — ABNORMAL LOW (ref 150–400)
RBC: 3.09 MIL/uL — ABNORMAL LOW (ref 3.87–5.11)
RDW: 15.6 % — ABNORMAL HIGH (ref 11.5–15.5)
WBC: 6 10*3/uL (ref 4.0–10.5)
nRBC: 0 % (ref 0.0–0.2)

## 2019-02-13 LAB — COMPREHENSIVE METABOLIC PANEL
ALT: <5 U/L (ref 0–44)
AST: 14 U/L — ABNORMAL LOW (ref 15–41)
Albumin: 1.4 g/dL — ABNORMAL LOW (ref 3.5–5.0)
Alkaline Phosphatase: 97 U/L (ref 38–126)
Anion gap: 11 (ref 5–15)
BUN: 50 mg/dL — ABNORMAL HIGH (ref 6–20)
CO2: 22 mmol/L (ref 22–32)
Calcium: 7.6 mg/dL — ABNORMAL LOW (ref 8.9–10.3)
Chloride: 110 mmol/L (ref 98–111)
Creatinine, Ser: 1.87 mg/dL — ABNORMAL HIGH (ref 0.44–1.00)
GFR calc Af Amer: 41 mL/min — ABNORMAL LOW (ref >60)
GFR calc non Af Amer: 35 mL/min — ABNORMAL LOW (ref >60)
Glucose, Bld: 122 mg/dL — ABNORMAL HIGH (ref 70–99)
Potassium: 3.6 mmol/L (ref 3.5–5.1)
Sodium: 143 mmol/L (ref 135–145)
Total Bilirubin: 0.4 mg/dL (ref 0.3–1.2)
Total Protein: 7.5 g/dL (ref 6.5–8.1)

## 2019-02-13 LAB — POCT I-STAT 7, (LYTES, BLD GAS, ICA,H+H)
Acid-base deficit: 3 mmol/L — ABNORMAL HIGH (ref 0.0–2.0)
Bicarbonate: 20.7 mmol/L (ref 20.0–28.0)
Calcium, Ion: 1.11 mmol/L — ABNORMAL LOW (ref 1.15–1.40)
HCT: 29 % — ABNORMAL LOW (ref 36.0–46.0)
Hemoglobin: 9.9 g/dL — ABNORMAL LOW (ref 12.0–15.0)
O2 Saturation: 99 %
Potassium: 4 mmol/L (ref 3.5–5.1)
Sodium: 145 mmol/L (ref 135–145)
TCO2: 22 mmol/L (ref 22–32)
pCO2 arterial: 30.5 mmHg — ABNORMAL LOW (ref 32.0–48.0)
pH, Arterial: 7.439 (ref 7.350–7.450)
pO2, Arterial: 115 mmHg — ABNORMAL HIGH (ref 83.0–108.0)

## 2019-02-13 LAB — GLUCOSE, CAPILLARY
Glucose-Capillary: 107 mg/dL — ABNORMAL HIGH (ref 70–99)
Glucose-Capillary: 110 mg/dL — ABNORMAL HIGH (ref 70–99)
Glucose-Capillary: 112 mg/dL — ABNORMAL HIGH (ref 70–99)
Glucose-Capillary: 121 mg/dL — ABNORMAL HIGH (ref 70–99)
Glucose-Capillary: 127 mg/dL — ABNORMAL HIGH (ref 70–99)
Glucose-Capillary: 137 mg/dL — ABNORMAL HIGH (ref 70–99)
Glucose-Capillary: 154 mg/dL — ABNORMAL HIGH (ref 70–99)

## 2019-02-13 LAB — MAGNESIUM: Magnesium: 1.8 mg/dL (ref 1.7–2.4)

## 2019-02-13 MED ORDER — LABETALOL HCL 5 MG/ML IV SOLN
10.0000 mg | INTRAVENOUS | Status: DC | PRN
  Administered 2019-02-13 – 2019-03-05 (×9): 10 mg via INTRAVENOUS
  Filled 2019-02-13 (×11): qty 4

## 2019-02-13 MED ORDER — MIDAZOLAM HCL 2 MG/2ML IJ SOLN
1.0000 mg | INTRAMUSCULAR | Status: DC | PRN
  Administered 2019-02-13 (×2): 2 mg via INTRAVENOUS
  Filled 2019-02-13 (×2): qty 2

## 2019-02-13 MED ORDER — LORAZEPAM 2 MG/ML IJ SOLN
2.0000 mg | INTRAMUSCULAR | Status: DC | PRN
  Administered 2019-02-13: 08:00:00 2 mg via INTRAVENOUS
  Filled 2019-02-13: qty 1

## 2019-02-13 MED ORDER — LEVETIRACETAM IN NACL 1000 MG/100ML IV SOLN
1000.0000 mg | Freq: Two times a day (BID) | INTRAVENOUS | Status: DC
  Administered 2019-02-13 – 2019-02-23 (×20): 1000 mg via INTRAVENOUS
  Filled 2019-02-13 (×20): qty 100

## 2019-02-13 MED ORDER — SODIUM CHLORIDE 0.9 % IV SOLN
1000.0000 mg | Freq: Once | INTRAVENOUS | Status: AC
  Administered 2019-02-13: 1000 mg via INTRAVENOUS
  Filled 2019-02-13: qty 20

## 2019-02-13 MED ORDER — LEVETIRACETAM IN NACL 1500 MG/100ML IV SOLN
1500.0000 mg | Freq: Once | INTRAVENOUS | Status: DC
  Filled 2019-02-13: qty 100

## 2019-02-13 MED ORDER — PHENYTOIN SODIUM 50 MG/ML IJ SOLN
100.0000 mg | Freq: Three times a day (TID) | INTRAMUSCULAR | Status: DC
  Administered 2019-02-13 – 2019-02-17 (×11): 100 mg via INTRAVENOUS
  Filled 2019-02-13 (×11): qty 2

## 2019-02-13 MED ORDER — LORAZEPAM 2 MG/ML IJ SOLN
INTRAMUSCULAR | Status: AC
  Administered 2019-02-13: 08:00:00 2 mg
  Filled 2019-02-13: qty 1

## 2019-02-13 NOTE — Progress Notes (Signed)
Two separate phone conversations with mom and dad for updates today, all questions answered.

## 2019-02-13 NOTE — Progress Notes (Addendum)
NAME:  Molly [name], MRN:  [id number], DOB:  Aug 13, 1986, LOS: 24 ADMISSION DATE:  01/20/2019, CONSULTATION DATE: 01/20/2019 REFERRING MD:  Bonner Puna, CHIEF COMPLAINT:  Sepsis  Brief History   33 y/o F with a history of IV drug abuse/polysubstance abuse (presented with urine drug screen positive for opiates and amphetamines) and a previous history in June or July 2019 of inadequately treated MRSA endocarditis having signed out AMA at that time.  She had a tricuspid vegetation at that time.  Admitted 4/4 hypotensive and in respiratory distress requiring intubation at [facility] long.   Admitted for acute respiratory failure/septic shock/ AKI due to septic emboli with MSSA bacteremia.  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 Awake / FC. Looks best in many days. Failed SBT 4/12 Failed SBT.  AKI improving, tx PRBC.  Less alert on precedex, methadone  4/13 More alert, FC. On precedex. Failed SBT. ID recommending PICC for antibiotcs > mother not sure about goals of care. S/p 1 unit PRBC   4/17 Trach placement 4/21 tolerating trach collar trials. 4/23 cdiff result positive overnight with continued diarrhea 4/26 seizure  Consults:  Nephrology Neurology  Procedures:  ETT 4/4 >> 4/17 R Femoral line 4/4 >> 4/8 R IJ Tunneled CVC (IR) 4/21 >>  Trach 4/17 >>  Significant Diagnostic Tests:  Echocardiogram 4/4 >> TV vegetation measures 1.5 mm x 0.6 mm., severe TR CT Chest 4/4 >> bilateral pulmonary nodules, peripheral  and cavitated.  LE venous duplex 4/20 >> negative MRI brain 4/26 >> scattered T2/FLAIR most in parieto-occipital region b/l EEG 4/27 >> no evidence of seizures  Micro Data:  Blood cultures x2 4/4 >> MSSA  BCx2 4/8 >> negative   C diff 4/22 >> antigen positive, toxin negative, PCR positive  Antimicrobials:  Vancomycin Zosyn Teflaro >> stopped Ancef 4/4 >>  Diflucan 4/21 >> 4/24 Vancomycin po 4/23 >>   Interim history/subjective:  Status epilepticus this morning-finally broke with Dilantin  Multiple seizures 1 at 6 AM, 0720 Seizure was prolonged lasting greater than 10 minutes Neurology was bedside  Objective   Blood pressure (!) 138/112, pulse (!) 104, temperature 99.3 F (37.4 C), temperature source Oral, resp. rate 19, height 5\' 1"  (1.549 m), weight 54.5 kg, SpO2 100 %.    Vent Mode: PRVC FiO2 (%):  [28 %-30 %] 30 % Set Rate:  [15 bmp] 15 bmp Vt Set:  [450 mL] 450 mL PEEP:  [5 cmH20] 5 cmH20 Plateau Pressure:  [17 cmH20] 17 cmH20   Intake/Output Summary (Last 24 hours) at 02/13/2019 0855 Last data filed at 02/13/2019 0600 Gross per 24 hour  Intake 2270 ml  Output 2075 ml  Net 195 ml   Filed Weights   02/08/19 0320 02/09/19 0500 02/10/19 0345  Weight: 55.7 kg 53.9 kg 54.5 kg   Somnolent Gaze deviation Tonic-clonic movement of right upper extremity S1-S2 appreciated Chest equal movement percussion note resonant no wheezes, no rales Abdomen is soft, nontender Extremities shows no clubbing, no cyanosis   Resolved Hospital Problem list   Septic Shock, thrush,  AKI  Active Problem list    New onset seizure 426 Status epilepticus 428 MRI showing PRES Antiepileptics per neurology -Loaded Dilantin, will start on maintenance -Keppra increased to 1 g twice daily -Seizure precautions -EEG ordered per neurology  Hypertension -Lopressor started 4/27 -Labetalol PRN -Goal BP less than 140/90  MSSA bacteremia with tricuspid valve endocarditis -Continue Ancef  until 5/19  C. difficile colitis -Enteral vancomycin until 5/2  Tracheostomy status -Follow-up with speech therapy -Trach care  Severe protein calorie malnutrition Dysphagia -Tube feeds -Follow-up with speech therapy  Hypokalemia -Replete as needed  Anemia of critical illness -Trend CBC -Transfuse hemoglobin less than 7  Hypokalemia. Plan - replace as needed   Best practice:  Diet: tube feeds DVT prophylaxis: SQ heparin GI prophylaxis: protonix Mobility: bed rest Code Status: Limited code Disposition: ICU  Labs:   CMP Latest Ref Rng & Units 02/13/2019 02/12/2019 02/11/2019  Glucose 70 - 99 mg/dL 122(H) 102(H) 117(H)  BUN 6 - 20 mg/dL 50(H) 57(H) 59(H)  Creatinine 0.44 - 1.00 mg/dL 1.87(H) 1.93(H) 1.92(H)  Sodium 135 - 145 mmol/L 143 141 139  Potassium 3.5 - 5.1 mmol/L 3.6 3.3(L) 3.7  Chloride 98 - 111 mmol/L 110 108 104  CO2 22 - 32 mmol/L 22 25 25   Calcium 8.9 - 10.3 mg/dL 7.6(L) 7.7(L) 7.7(L)  Total Protein 6.5 - 8.1 g/dL 7.5 - -  Total Bilirubin 0.3 - 1.2 mg/dL 0.4 - -  Alkaline Phos 38 - 126 U/L 97 - -  AST 15 - 41 U/L 14(L) - -  ALT 0 - 44 U/L <5 - -   CBC Latest Ref Rng & Units 02/13/2019 02/12/2019 02/11/2019  WBC 4.0 - 10.5 K/uL 6.0 5.6 6.5  Hemoglobin 12.0 - 15.0 g/dL 8.6(L) 8.0(L) 9.3(L)  Hematocrit 36.0 - 46.0 % 27.5(L) 25.0(L) 30.3(L)  Platelets 150 - 400 K/uL 147(L) 131(L) 142(L)   ABG    Component Value Date/Time   PHART 7.364 02/11/2019 1753   PCO2ART 38.9 02/11/2019 1753   PO2ART 70.0 (L) 02/11/2019 1753   HCO3 22.2 02/11/2019 1753   TCO2 23 02/11/2019 1753   ACIDBASEDEF 3.0 (H) 02/11/2019 1753   O2SAT 93.0 02/11/2019 1753   CBG (last 3)  Recent Labs    02/13/19 0117 02/13/19 0443 02/13/19 0736  GLUCAP 107* 121* 110*   Critical care time spent evaluating patient, reviewing records formulating plan of care 40 minutes  Sherrilyn Rist, MD

## 2019-02-13 NOTE — Progress Notes (Signed)
eLink Physician-Brief Progress Note Patient Name: Molly [name] DOB: [birth year] MRN: [id number]   Date of Service  02/13/2019  HPI/Events of Note  Gross hematuria - Currently on Heparin [place].  eICU Interventions  Will order: 1. D/C Heparin [place]. 2. Place already ordered SCD's. 3. Urine culture now.      Intervention Category Major Interventions: Other:  Lysle Dingwall 02/13/2019, 9:56 PM

## 2019-02-13 NOTE — Progress Notes (Signed)
Left message for mother to call back for update

## 2019-02-13 NOTE — Social Work (Signed)
Aware pt is recommended for SNF but remains medically unstable for any referrals at this time.  CSW contacted financial counseling regarding status of Medicaid and Disability.   CSW continuing to follow for support with disposition when medically appropriate.  Westley Hummer, MSW, [place] Work [phone]

## 2019-02-13 NOTE — Progress Notes (Signed)
Reason for consult:   Subjective: Called by nurse this morning, patient seizing    ROS: unable to obtain    Examination  Vital signs in last 24 hours: Temp:  [98.2 F (36.8 C)-99.9 F (37.7 C)] 99.3 F (37.4 C) (04/28 0400) Pulse Rate:  [93-122] 104 (04/28 0700) Resp:  [17-39] 19 (04/28 0700) BP: (124-150)/(86-130) 138/112 (04/28 0700) SpO2:  [95 %-100 %] 100 % (04/28 0700) FiO2 (%):  [28 %-30 %] 30 % (04/28 0606)  General: lying in bed CVS: pulse-normal rate and rhythm RS: breathing comfortably Extremities: normal   Neuro: MS: unresponsive CN: pupils equal and reactive, eyes show downward and rightward gaze deviation Motor:withdraws in all 4 extremities, right arm tonic-clonic jerking Reflexes: plantars downingoing Coordination: testing not possible  Gait: testing not possible   Basic Metabolic Panel: Recent Labs  Lab 02/09/19 0602 02/10/19 0524 02/11/19 0640 02/11/19 1753 02/11/19 1800 02/12/19 0345 02/13/19 0431  NA 145 142 142 144 139 141 143  K 3.7 3.2* 3.4* 3.8 3.7 3.3* 3.6  CL 109 107 108  --  104 108 110  CO2 28 26 27   --  25 25 22   GLUCOSE 98 99 92  --  117* 102* 122*  BUN 87* 71* 61*  --  59* 57* 50*  CREATININE 1.98* 1.98* 1.78*  --  1.92* 1.93* 1.87*  CALCIUM 7.9* 7.7* 7.6*  --  7.7* 7.7* 7.6*  MG 1.7 1.6* 1.6*  --  2.2 2.1 1.8  PHOS 3.2 3.3 3.7  --  3.8  --   --     CBC: Recent Labs  Lab 02/10/19 0524 02/11/19 0640 02/11/19 1753 02/11/19 1800 02/12/19 0345 02/13/19 0431  WBC 2.7* 3.6*  --  6.5 5.6 6.0  NEUTROABS  --   --   --   --  4.1  --   HGB 7.2* 7.7* 9.5* 9.3* 8.0* 8.6*  HCT 23.4* 25.3* 28.0* 30.3* 25.0* 27.5*  MCV 90.7 90.4  --  90.2 87.7 89.0  PLT 81* 105*  --  142* 131* 147*     Coagulation Studies: No results for input(s): LABPROT, INR in the last 72 hours.      ASSESSMENT AND PLAN 33 year old female with IV drug abuse and MRSA endocarditis, sacral osteomyelitis, C. difficile consulted for seizure-like activity.   She had 2 seizures yesterday there was generalized tonic-clonic with gaze deviation with a return to baseline.  She was loaded with Keppra and has not had any further seizures.  A stat CT head was performed which was concerning for PRES. MRI Brain consistent with PRES. Routine EEG performed: no seizure activity seen.  Patient had 1 seizure in the morning at 6 am, CCM called. Starting 7.20 pm patient has had prolonged seizure characterized by right arm jerking and downward gaze deviation lasting >10 min. Received 2mg  of Ativan x2 and resolved for few min without return to baseline followed by return to focal status epilepticus.  Initially ordered Keppra bolus after being notified of seizure, however changed to Dilantin as patient is in clinical status epilepticus   Status epilepticus Posterior reversible encephalopathy syndrome MRSA bacteremia/endocarditis  Recommendations - Load Dilantin 20mg /kg and start maintenance dose - Increase Keppra to 1g BID ( patient only 54kg) - Seizure precuations - Close neurochecks -  cEEG if not back to baseline in 1-2 hours - BP goal less than 140/90 mmHg, management per ICU -Avoid antiplatelets -Continue to treat bacteremia   Yarisbel Miranda [facility] Pager Number [phone] For questions  after 7pm please refer to AMION to reach the Neurologist on call

## 2019-02-13 NOTE — Progress Notes (Signed)
SLP Cancellation Note  Patient Details Name: Molly [name] MRN: [id number] DOB: 1986-05-01   Cancelled treatment:       Reason Eval/Treat Not Completed: Medical issues which prohibited therapy. Pt had more seizures this morning, is currently on the vent. Will f/u as able.   [name] 02/13/2019, 9:37 AM  [name], M.A. [place] Acute Environmental education officer [phone] Office [phone]

## 2019-02-13 NOTE — Progress Notes (Signed)
Patient ID: Molly [name], female   DOB: Jun 25, 1986, 33 y.o.   MRN: [id number]  This NP visited patient at the bedside as a follow up for for palliative medicine needs and emotional support; patient has continued to decline,  is requiring ventilator support and has new onset seizures.  Initial consult with palliative medicine completed on 01/31/2019  Placed call and spoke to mother by phone.  We discussed the seriousness of the current medical situation and limitations of medical interventions to prolong quality of  life when the body begins to fail to thrive.  We discussed concept of human mortality.  Patient's mother adamantly verbalizes her "knowing" that her daughter will recover and "I will not pull the plug".  Emotional support offered.   Discussed with mother the importance of continued conversation with her  family and the  medical providers regarding overall plan of care and treatment options,  ensuring decisions are within the context of the patients values and GOCs.  Questions and concerns addressed  Encouraged her to call with questions or concerns  Total time spent on the unit was 35 minutes  Greater than 50% of the time was spent in counseling and coordination of care  Wadie Lessen NP  Palliative Medicine Team Team Phone # [phone] Pager [phone]

## 2019-02-13 NOTE — Progress Notes (Signed)
eLink Physician-Brief Progress Note Patient Name: Molly [name] DOB: 07-29-1986 MRN: [id number]   Date of Service  02/13/2019  HPI/Events of Note  Called by nursing d/t seizure. On video assessment patient did not appear to being having a seizure. Patient is already on [place].  eICU Interventions  Will order: 1. Versed 1 - 2 mg IV Q 2 hours PRN seizure.  2. Further management per neurology.      Intervention Category Major Interventions: Seizures - evaluation and management  Brogen Duell Cornelia Copa 02/13/2019, 5:48 AM

## 2019-02-13 NOTE — Progress Notes (Signed)
RT called to bedside to place pt back on VENT due to Pt having a seizure.  Pt tolerating well at this time, RT to monitor and assess as needed.

## 2019-02-13 NOTE — Progress Notes (Signed)
Assisted tele visit to patient with mother.  Adam Phenix, RN

## 2019-02-13 NOTE — Progress Notes (Signed)
EEG Maintenance complete. Re glued and secured leads. No skin breakdown

## 2019-02-13 NOTE — Progress Notes (Signed)
LTM running - no initial skin breakdown.

## 2019-02-13 NOTE — Progress Notes (Deleted)
Pt has rectal pouch attached to right side of groin. Output from pouch has appearance of urine. Urine stick test performed, compared output of pH between rectal pouch and foley. pH from pouch is 7, pH from foley is 4. Informed CCM, responded with possible output from pouch is weaping.

## 2019-02-13 NOTE — Progress Notes (Signed)
MEDICATION RELATED CONSULT NOTE - INITIAL   Pharmacy Consult for Phenytoin Indication: Status epilepticus  Allergies  Allergen Reactions  . Vancomycin Other (See Comments)    Possible contributor to AKI and thrombocytopenia    Patient Measurements: Height: _0  (154.9 cm) Weight: 120 lb 2.4 oz (54.5 kg) IBW/kg (Calculated) : 47.8  Vital Signs: Temp: 99.3 F (37.4 C) (04/28 0400) Temp Source: Oral (04/28 0400) BP: 82/68 (04/28 0900) Pulse Rate: 104 (04/28 0700) Intake/Output from previous day: 04/27 0701 - 04/28 0700 In: 2378.1 [P.O.:120; NG/GT:1950; IV Piggyback:308.1] Out: 2075 [Urine:2075] Intake/Output from this shift: No intake/output data recorded.  Labs: Recent Labs    02/11/19 0640  02/11/19 1800 02/12/19 0345 02/13/19 0431 02/13/19 0941  WBC 3.6*  --  6.5 5.6 6.0  --   HGB 7.7*   < > 9.3* 8.0* 8.6* 9.9*  HCT 25.3*   < > 30.3* 25.0* 27.5* 29.0*  PLT 105*  --  142* 131* 147*  --   CREATININE 1.78*  --  1.92* 1.93* 1.87*  --   MG 1.6*  --  2.2 2.1 1.8  --   PHOS 3.7  --  3.8  --   --   --   ALBUMIN 1.2*  --   --   --  1.4*  --   PROT 7.3  --   --   --  7.5  --   AST 14*  --   --   --  14*  --   ALT <5  --   --   --  <5  --   ALKPHOS 76  --   --   --  97  --   BILITOT 0.4  --   --   --  0.4  --    < > = values in this interval not displayed.   Estimated Creatinine Clearance: 32.9 mL/min (A) (by C-G formula based on SCr of 1.87 mg/dL (H)).    Medical History: Past Medical History:  Diagnosis Date  . 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 Abscess of skin    buttock - most recently 2009/10  . CKD (chronic kidney disease) stage 3, GFR 30-59 ml/min (HCC)   . Drug-seeking behavior   . Hypersplenism   . IV drug user   . MRSA (methicillin resistant Staphylococcus aureus) infection   . MRSA bacteremia   . Pancytopenia ([place])   . 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 Septic pulmonary embolism ([place])   . Tobacco abuse     Medications:   Scheduled:  . acetaminophen  650 mg Per Tube Q6H  . chlorhexidine gluconate (MEDLINE KIT)  15 mL Mouth Rinse BID  . Chlorhexidine Gluconate Cloth  6 each Topical Daily  . feeding supplement (OSMOLITE 1.5 CAL)  1,000 mL Per Tube Q24H  . feeding supplement (PRO-STAT SUGAR FREE 64)  30 mL Per Tube BID  . fentaNYL  1 patch Transdermal Q72H  . free water  100 mL Per Tube Q8H  . heparin injection (subcutaneous)  5,000 Units Subcutaneous Q8H  . mouth rinse  15 mL Mouth Rinse 10 times per day  . Melatonin  3 mg Oral QHS  . metoprolol tartrate  12.5 mg Per Tube BID  . pantoprazole sodium  40 mg Per Tube Daily  . sodium chloride flush  10-40 mL Intracatheter Q12H  . vancomycin  125 mg Per Tube QID  . vitamin C  250 mg Per NG tube BID  Infusions:  . sodium chloride 1,000 mL (02/05/19 0912)  .  ceFAZolin (ANCEF) IV 2 g (02/13/19 0521)  . levETIRAcetam      Assessment: Pt is a 53 yoF who was having seizures this morning then developed status. She was previously on keppra for seizures, increased given clinical picture on 4/28. She was loaded on phenytoin this morning 1000 mg IV (20 mg/kg). Pharmacy consulted to start maintenance phenytoin.   Goal of Therapy:  Phenytoin level 10-20 mcg/mL   Plan:  Start phenytoin 100 mg IV TID (~5 mg/kg)   Claiborne Billings, PharmD PGY2 Cardiology Pharmacy Resident Please check AMION for all Pharmacist numbers by unit 02/13/2019 11:36 AM

## 2019-02-14 LAB — CBC WITH DIFFERENTIAL/PLATELET
Abs Immature Granulocytes: 0.04 10*3/uL (ref 0.00–0.07)
Basophils Absolute: 0 10*3/uL (ref 0.0–0.1)
Basophils Relative: 0 %
Eosinophils Absolute: 0.1 10*3/uL (ref 0.0–0.5)
Eosinophils Relative: 1 %
HCT: 24.6 % — ABNORMAL LOW (ref 36.0–46.0)
Hemoglobin: 7.8 g/dL — ABNORMAL LOW (ref 12.0–15.0)
Immature Granulocytes: 1 %
Lymphocytes Relative: 20 %
Lymphs Abs: 1.7 10*3/uL (ref 0.7–4.0)
MCH: 28.4 pg (ref 26.0–34.0)
MCHC: 31.7 g/dL (ref 30.0–36.0)
MCV: 89.5 fL (ref 80.0–100.0)
Monocytes Absolute: 0.7 10*3/uL (ref 0.1–1.0)
Monocytes Relative: 8 %
Neutro Abs: 6 10*3/uL (ref 1.7–7.7)
Neutrophils Relative %: 70 %
Platelets: 153 10*3/uL (ref 150–400)
RBC: 2.75 MIL/uL — ABNORMAL LOW (ref 3.87–5.11)
RDW: 16.2 % — ABNORMAL HIGH (ref 11.5–15.5)
WBC: 8.5 10*3/uL (ref 4.0–10.5)
nRBC: 0 % (ref 0.0–0.2)

## 2019-02-14 LAB — BASIC METABOLIC PANEL
Anion gap: 9 (ref 5–15)
BUN: 57 mg/dL — ABNORMAL HIGH (ref 6–20)
CO2: 24 mmol/L (ref 22–32)
Calcium: 7.3 mg/dL — ABNORMAL LOW (ref 8.9–10.3)
Chloride: 111 mmol/L (ref 98–111)
Creatinine, Ser: 2.24 mg/dL — ABNORMAL HIGH (ref 0.44–1.00)
GFR calc Af Amer: 33 mL/min — ABNORMAL LOW (ref >60)
GFR calc non Af Amer: 28 mL/min — ABNORMAL LOW (ref >60)
Glucose, Bld: 119 mg/dL — ABNORMAL HIGH (ref 70–99)
Potassium: 3.1 mmol/L — ABNORMAL LOW (ref 3.5–5.1)
Sodium: 144 mmol/L (ref 135–145)

## 2019-02-14 LAB — GLUCOSE, CAPILLARY
Glucose-Capillary: 139 mg/dL — ABNORMAL HIGH (ref 70–99)
Glucose-Capillary: 137 mg/dL — ABNORMAL HIGH (ref 70–99)
Glucose-Capillary: 111 mg/dL — ABNORMAL HIGH (ref 70–99)
Glucose-Capillary: 129 mg/dL — ABNORMAL HIGH (ref 70–99)
Glucose-Capillary: 129 mg/dL — ABNORMAL HIGH (ref 70–99)

## 2019-02-14 LAB — MAGNESIUM: Magnesium: 1.7 mg/dL (ref 1.7–2.4)

## 2019-02-14 MED ORDER — JUVEN PO PACK
1.0000 | PACK | Freq: Two times a day (BID) | ORAL | Status: DC
  Administered 2019-02-14 – 2019-02-25 (×22): 1
  Filled 2019-02-14 (×26): qty 1

## 2019-02-14 MED ORDER — VITAL AF 1.2 CAL PO LIQD
1000.0000 mL | ORAL | Status: DC
  Administered 2019-02-14 – 2019-02-19 (×5): 1000 mL

## 2019-02-14 NOTE — Progress Notes (Signed)
Nutrition Follow-up  DOCUMENTATION CODES:   Not applicable  INTERVENTION:   Tube Feeding:  Vital AF 1.2 at 60 ml/hr (1440 mL over 24 hours) Provides 1728 kcals, 108 g of protein and 1166 mL of free water  Add Juven BID, each packet provides 80 calories, 8 grams of carbohydrate, 2.5  grams of protein (collagen), 7 grams of L-arginine and 7 grams of L-glutamine; supplement contains CaHMB, Vitamins C, E, B12 and Zinc to promote wound healing   NUTRITION DIAGNOSIS:   Inadequate oral intake related to inability to eat(pt sedated and ventilated ) as evidenced by NPO status.  Being addressed via TF   GOAL:   Provide needs based on ASPEN/SCCM guidelines  Progressing  MONITOR:   Vent status, Labs, Weight trends, I & O's, Skin  REASON FOR ASSESSMENT:   Ventilator    ASSESSMENT:    33 y/o female with h/o IVDU/polysubstance abuse and MRSA bacteremia/TV endocarditis/spinal osteomyelitis/septic pulmonary emboli (left AMA July 2019) who presented to the ED with AMS found to be septic with hypothermia, hypotension, and hypoxia.   4/04 Admit, intubated 4/17 Trach placed, Cortrak placed at LOT 4/21 Cortrak tube tip pulled back to gastric placement 4/23 MBS with diet advancement to Regular, Nectar; C.diff + 4/26 Seizures 4/28 Status epilepticus, back on vent   Patient is currently intubated on ventilator support; no sedation MV: 6.7 L/min Temp (24hrs), Avg:101.4 F (38.6 C), Min:98.3 F (36.8 C), Max:102.6 F (39.2 C)  PO intake inadequate while on Regular, Nectar thick liquids; recorded po intake 28%. Noted nothing recorded since 4/25. Pt had been switched to nocturnal feedings with diet advancement; unsure how much TF pt has received since 4/25. Plan to  switch back to 24 hour continuous feedings while on vent support   Osmolite 1.5 infusing at 30 ml/hr per RN; order for 12 hours feedings at 65 ml/hr currently active. Pro-Stat 30 mL BID, free water every 8 hours via  Cortrak  New stage II pressure injury on back noted; MASD to scarum  Current wt 60.4 kg; admission weight 45.8 kg. Net negative per I/O flow sheet 3+ perineal edema, 1+ sacral and b/l LE edema present Unsure of true dry wt at this point; plan to utilize 55 kg   Labs: potassium 3.1 (L), magnesium wdl, Creatinine 2.24, BUN 57 Meds: Vitamin C 250 mg BID  Diet Order:   Diet Order            Diet NPO time specified  Diet effective now              EDUCATION NEEDS:   Not appropriate for education at this time  Skin:  Skin Assessment: Skin Integrity Issues: Skin Integrity Issues:: Other (Comment), Stage II Stage II: mid back Other: MASD: sacrum  Last BM:  4/29 type 6 (rectal tube removed); C.diff positive on 4/23  Height:   Ht Readings from Last 1 Encounters:  01/20/19 5\' 1"  (1.549 m)    Weight:   Wt Readings from Last 1 Encounters:  02/14/19 60.4 kg    Ideal Body Weight:  47.7 kg  BMI:  Body mass index is 25.16 kg/m.  Estimated Nutritional Needs:   Kcal:  1700 kcals   Protein:  83-110 g   Fluid:  >/= 1.7 L    Kerman Passey MS, RD, LDN, CNSC [phone] Pager  [phone] Weekend/On-Call Pager

## 2019-02-14 NOTE — Progress Notes (Signed)
Reason for consult: Status epilepticus/PRES  Subjective: Patient is somnolent, not following any commands.  Is not had no further clinical seizures since yesterday.  Has been hooked up to the LTM EEG.  Blood pressures have been stable.   ROS:  Unable to obtain due to poor mental status  Examination  Vital signs in last 24 hours: Temp:  [98.3 F (36.8 C)-102.6 F (39.2 C)] 102.4 F (39.1 C) (04/29 0600) Pulse Rate:  [82-126] 115 (04/29 0700) Resp:  [15-20] 16 (04/29 0700) BP: (82-128)/(68-113) 118/94 (04/29 0700) SpO2:  [93 %-100 %] 100 % (04/29 0732) FiO2 (%):  [30 %] 30 % (04/29 0732) Weight:  [60.4 kg] 60.4 kg (04/29 0600)  General: lying in bed CVS: pulse-normal rate and rhythm RS: breathing comfortably Extremities: normal   Neuro: MS: Somnolent, opens eyes to noxious stimulus but  does not follow commands CN: pupils equal and reactive,  EOMI, face symmetric, tongue midline, normal sensation over face, Motor: Withdraws left side greater than right side Reflexes:plantars: flexor Coordination: unable to assess Gait: not tested  Basic Metabolic Panel: Recent Labs  Lab 02/09/19 0602 02/10/19 0524 02/11/19 0640 02/11/19 1753 02/11/19 1800 02/12/19 0345 02/13/19 0431 02/13/19 0941  NA 145 142 142 144 139 141 143 145  K 3.7 3.2* 3.4* 3.8 3.7 3.3* 3.6 4.0  CL 109 107 108  --  104 108 110  --   CO2 28 26 27   --  25 25 22   --   GLUCOSE 98 99 92  --  117* 102* 122*  --   BUN 87* 71* 61*  --  59* 57* 50*  --   CREATININE 1.98* 1.98* 1.78*  --  1.92* 1.93* 1.87*  --   CALCIUM 7.9* 7.7* 7.6*  --  7.7* 7.7* 7.6*  --   MG 1.7 1.6* 1.6*  --  2.2 2.1 1.8  --   PHOS 3.2 3.3 3.7  --  3.8  --   --   --     CBC: Recent Labs  Lab 02/10/19 0524 02/11/19 0640 02/11/19 1753 02/11/19 1800 02/12/19 0345 02/13/19 0431 02/13/19 0941  WBC 2.7* 3.6*  --  6.5 5.6 6.0  --   NEUTROABS  --   --   --   --  4.1  --   --   HGB 7.2* 7.7* 9.5* 9.3* 8.0* 8.6* 9.9*  HCT 23.4* 25.3*  28.0* 30.3* 25.0* 27.5* 29.0*  MCV 90.7 90.4  --  90.2 87.7 89.0  --   PLT 81* 105*  --  142* 131* 147*  --      Coagulation Studies: No results for input(s): LABPROT, INR in the last 72 hours.   LTM EEG Impression: This EEG shows evidence of bilateral cerebral dysfunction maximal in the left hemisphere with potential epileptogenicity in the right parietal region. No EEG seizures were recorded.    ASSESSMENT AND PLAN 33 year old female with IV drug abuse and MRSA endocarditis,sacral osteomyelitis,C. difficile consulted for seizure-like activity. She had 2 seizures yesterday there was generalized tonic-clonic with gaze deviation with a return to baseline. She was loaded with Keppra and has not had any further seizures. A stat CT head was performed which was concerning for PRES. MRI Brain consistent with PRES. Routine EEG performed:no seizure activity seen.  Patient had 1 seizure in the morning at 6 am, CCM called. Starting 7.20 pm patient has had prolonged seizure characterized by right arm jerking and downward gaze deviation lasting >10 min. Received 2mg  of Ativan  x2 and resolved for few min without return to baseline followed by return to focal status epilepticus.  Patient was loaded with Dilantin and connected to long-term EEG monitoring.  Status epilepticus resolved with Dilantin and EEG showed frequent right parieto temporal spikes, but no seizures.  Keppra dose was increased and she is on Dilantin maintenance dosing.   She has had no further clinical or electrographic seizures since yesterday.  Appears lethargic this morning, not following commands, likely due to medications. EEG showed left parietal temporal spikes, did not progress to seizures.   Statusepilepticus Posterior reversible encephalopathy syndrome MRSA bacteremia/endocarditis  Recommendations -CONTINUE  Dilantin appreciate pharmacy assistance in dosing, follow-up on levels - CONTINUEe Keppra to 1g BID (  patient only 54kg) - Seizure precuations - Close neurochecks -We will discontinue long-term EEG as patient has been seizure-free for 24 hours -Continue BP goal less than 140/90 mmHg, management per ICU -Avoid antiplatelets -Continue to treat bacteremia  This patient is neurologically critically ill due to status epilepticus (resolved), PR ES-she is a high risk for neurological worsening due to worsening seizures, status epilepticus, cerebral edema, complications of prolonged ICU stay, complications of multiple seizure medications, aspiration risk, respiratory failure, cardiac arrest and sudden death in epilepsy.  I spent 40 minutes of neuro critical care time with this patient including reviewing EEG, reviewing multiple databases, monitoring vital signs, lab results, neurological exam, coordation with critical care team/and with family members.    Karena Addison Aroor [facility] Pager Number [phone] For questions after 7pm please refer to AMION to reach the Neurologist on call

## 2019-02-14 NOTE — Progress Notes (Signed)
SLP Cancellation Note  Patient Details Name: Simran Bomkamp MRN: [id number] DOB: [birth year]   Cancelled treatment:       Reason Eval/Treat Not Completed: Medical issues which prohibited therapy. Pt on the vent   Cienna Dumais, Katherene Ponto 02/14/2019, 10:07 AM

## 2019-02-14 NOTE — Progress Notes (Signed)
LTM EEG discontinued. No skin breakdown at [facility].

## 2019-02-14 NOTE — Procedures (Signed)
CPT/Type of Study: 58850; 24hr EEG with video Recording Date: 02/13/2019 10:15 - 02/14/2019 07:30 Interpreting physician: Izora Ribas, DO  History: This is a 33 year old patient with history of IVDA and MRSA endocarditis presenting with seizures, undergoing an EEG to evaluate for seizures.   Technical Description: The EEG was performed using standard setting per the guidelines of American Clinical Neurophysiology Society (ACNS).   A minimum of 21 electrodes were placed on scalp according to the International 10-20 or/and 10-10 Systems. Supplemental electrodes were placed as needed. Single EKG electrode was also used to detect cardiac arrhythmia. Patient's behavior was continuously recorded on video simultaneously with EEG. A minimum of 16 channels were used for data display. Each epoch of study was reviewed manually daily and as needed using standard referential and bipolar montages. Computerized quantitative EEG analysis (such as compressed spectral array analysis, trending, automated spike & seizure detection) were used as indicated.   Clinical State: Stupor Background: The posterior dominant rhythm was not recorded Overall Amplitude: Normal Predominant Frequency: Delta with overriding beta, maximal left  Asymmetry: No Sleep background: Normal sleep architecture was not seen  Abnormalities: Continuous slow generalized and lateralized left  Rhythmic or periodic pattern: None  Epileptiform activity: Yes frequent sharp waves in the right temporo-parietal region (P8, T8)  Electrographic Seizure: No Events: No  Breach rhythm: No Reactivity: Yes  Stimulation procedures:  Hyperventilation: Not done Photic stimulation: Not done  Impression: This EEG shows evidence of bilateral cerebral dysfunction maximal in the left hemisphere with potential epileptogenicity in the right parietal region. No EEG seizures were recorded.

## 2019-02-14 NOTE — Progress Notes (Signed)
NAME:  Molly [name], MRN:  [id number], DOB:  Nov 20, 1985, LOS: 21 ADMISSION DATE:  01/20/2019, CONSULTATION DATE: 01/20/2019 REFERRING MD:  Bonner Puna, CHIEF COMPLAINT:  Sepsis  Brief History   33 y/o F with a history of IV drug abuse/polysubstance abuse (presented with urine drug screen positive for opiates and amphetamines) and a previous history in June or July 2019 of inadequately treated MRSA endocarditis having signed out AMA at that time.  She had a tricuspid vegetation at that time.  Admitted 4/4 hypotensive and in respiratory distress requiring intubation at [facility] long.   Admitted for acute respiratory failure/septic shock/ AKI due to septic emboli with MSSA bacteremia.  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 Awake / FC. Looks best in many days. Failed SBT 4/12 Failed SBT.  AKI improving, tx PRBC.  Less alert on precedex, methadone  4/13 More alert, FC. On precedex. Failed SBT. ID recommending PICC for antibiotcs > mother not sure about goals of care. S/p 1 unit PRBC   4/17 Trach placement 4/21 tolerating trach collar trials. 4/23 cdiff result positive overnight with continued diarrhea 4/26 seizure 4/28 status epilepticus  Consults:  Nephrology Neurology  Procedures:  ETT 4/4 >> 4/17 R Femoral line 4/4 >> 4/8 R IJ Tunneled CVC (IR) 4/21 >>  Trach 4/17 >>  Significant Diagnostic Tests:  Echocardiogram 4/4 >> TV vegetation measures 1.5 mm x 0.6 mm., severe TR CT Chest 4/4 >> bilateral  pulmonary nodules, peripheral and cavitated.  LE venous duplex 4/20 >> negative MRI brain 4/26 >> scattered T2/FLAIR most in parieto-occipital region b/l EEG 4/27 >> no evidence of seizures EEG 4/29  Micro Data:  Blood cultures x2 4/4 >> MSSA  BCx2 4/8 >> negative   C diff 4/22 >> antigen positive, toxin negative, PCR positive  Antimicrobials:  Vancomycin Zosyn Teflaro >> stopped Ancef 4/4 >>  Diflucan 4/21 >> 4/24 Vancomycin po 4/23 >>   Interim history/subjective:  Status epilepticus 4/28-finally broke with Dilantin  Multiple seizures 1 at 6 AM, 0720 4/28 Stable overnight Hematuria noted overnight  Objective   Blood pressure 111/90, pulse (!) 126, temperature (!) 102.4 F (39.1 C), resp. rate 16, height 5\' 1"  (1.549 m), weight 60.4 kg, SpO2 100 %.    Vent Mode: PRVC FiO2 (%):  [30 %] 30 % Set Rate:  [15 bmp] 15 bmp Vt Set:  [450 mL] 450 mL PEEP:  [5 cmH20] 5 cmH20 Plateau Pressure:  [16 cmH20-17 cmH20] 16 cmH20   Intake/Output Summary (Last 24 hours) at 02/14/2019 1025 Last data filed at 02/14/2019 0700 Gross per 24 hour  Intake 2065.52 ml  Output 2325 ml  Net -259.48 ml   Filed Weights   02/09/19 0500 02/10/19 0345 02/14/19 0600  Weight: 53.9 kg 54.5 kg 60.4 kg   Awake, does not follow commands  No gaze deviation S1-S2 appreciated Chest equal movement, percussion notes resonant No wheezes Abdomen is soft, nontender No clubbing  Resolved Hospital Problem list   Septic Shock, thrush, AKI  Active Problem list    New onset seizure  4/26 Status epilepticus 4/28 MRI showing progress Antiepileptics per neurology -On Dilantin -On Keppra -Seizure precautions  Hypertension -Lopressor -PRN labetalol -Goal BP less than 140/90  MSSA bacteremia with tricuspid valve endocarditis -Continue Ancef until 5/19  C. difficile colitis -Enteral vancomycin until 5/2  Tracheostomy status -Follow-up with speech therapy -Trach care  Severe protein calorie  malnutrition Dysphagia -Tube feeds -Follow-up with speech therapy   Hypokalemia -Replete as needed  Anemia of chronic illness -Trend CBC  Best practice:  Diet: tube feeds DVT prophylaxis: SQ heparin GI prophylaxis: protonix Mobility: bed rest Code Status: Limited code Disposition: ICU  Labs:   CMP Latest Ref Rng & Units 02/13/2019 02/13/2019 02/12/2019  Glucose 70 - 99 mg/dL - 122(H) 102(H)  BUN 6 - 20 mg/dL - 50(H) 57(H)  Creatinine 0.44 - 1.00 mg/dL - 1.87(H) 1.93(H)  Sodium 135 - 145 mmol/L 145 143 141  Potassium 3.5 - 5.1 mmol/L 4.0 3.6 3.3(L)  Chloride 98 - 111 mmol/L - 110 108  CO2 22 - 32 mmol/L - 22 25  Calcium 8.9 - 10.3 mg/dL - 7.6(L) 7.7(L)  Total Protein 6.5 - 8.1 g/dL - 7.5 -  Total Bilirubin 0.3 - 1.2 mg/dL - 0.4 -  Alkaline Phos 38 - 126 U/L - 97 -  AST 15 - 41 U/L - 14(L) -  ALT 0 - 44 U/L - <5 -   CBC Latest Ref Rng & Units 02/13/2019 02/13/2019 02/12/2019  WBC 4.0 - 10.5 K/uL - 6.0 5.6  Hemoglobin 12.0 - 15.0 g/dL 9.9(L) 8.6(L) 8.0(L)  Hematocrit 36.0 - 46.0 % 29.0(L) 27.5(L) 25.0(L)  Platelets 150 - 400 K/uL - 147(L) 131(L)   ABG    Component Value Date/Time   PHART 7.439 02/13/2019 0941   PCO2ART 30.5 (L) 02/13/2019 0941   PO2ART 115.0 (H) 02/13/2019 0941   HCO3 20.7 02/13/2019 0941   TCO2 22 02/13/2019 0941   ACIDBASEDEF 3.0 (H) 02/13/2019 0941   O2SAT 99.0 02/13/2019 0941   CBG (last 3)  Recent Labs    02/13/19 2339 02/14/19 0344 02/14/19 0738  GLUCAP 154* 139* 137*   Critical care time spent evaluating patient, reviewing records, formulating plan of care 30 minutes  Sherrilyn Rist, MD

## 2019-02-15 DIAGNOSIS — L8990 Pressure ulcer of unspecified site, unspecified stage: Secondary | ICD-10-CM

## 2019-02-15 LAB — CBC
HCT: 23.6 % — ABNORMAL LOW (ref 36.0–46.0)
Hemoglobin: 7.3 g/dL — ABNORMAL LOW (ref 12.0–15.0)
MCH: 27.7 pg (ref 26.0–34.0)
MCHC: 30.9 g/dL (ref 30.0–36.0)
MCV: 89.4 fL (ref 80.0–100.0)
Platelets: 122 10*3/uL — ABNORMAL LOW (ref 150–400)
RBC: 2.64 MIL/uL — ABNORMAL LOW (ref 3.87–5.11)
RDW: 16.3 % — ABNORMAL HIGH (ref 11.5–15.5)
WBC: 6.2 10*3/uL (ref 4.0–10.5)
nRBC: 0 % (ref 0.0–0.2)

## 2019-02-15 LAB — PHENYTOIN LEVEL, TOTAL: Phenytoin Lvl: 22.5 ug/mL — ABNORMAL HIGH (ref 10.0–20.0)

## 2019-02-15 LAB — BASIC METABOLIC PANEL
Anion gap: 15 (ref 5–15)
Anion gap: 9 (ref 5–15)
Anion gap: 12 (ref 5–15)
BUN: 57 mg/dL — ABNORMAL HIGH (ref 6–20)
BUN: 68 mg/dL — ABNORMAL HIGH (ref 6–20)
BUN: 69 mg/dL — ABNORMAL HIGH (ref 6–20)
CO2: 21 mmol/L — ABNORMAL LOW (ref 22–32)
CO2: 20 mmol/L — ABNORMAL LOW (ref 22–32)
CO2: 19 mmol/L — ABNORMAL LOW (ref 22–32)
Calcium: 7.1 mg/dL — ABNORMAL LOW (ref 8.9–10.3)
Calcium: 7.1 mg/dL — ABNORMAL LOW (ref 8.9–10.3)
Calcium: 7.1 mg/dL — ABNORMAL LOW (ref 8.9–10.3)
Chloride: 109 mmol/L (ref 98–111)
Chloride: 115 mmol/L — ABNORMAL HIGH (ref 98–111)
Chloride: 114 mmol/L — ABNORMAL HIGH (ref 98–111)
Creatinine, Ser: 2.15 mg/dL — ABNORMAL HIGH (ref 0.44–1.00)
Creatinine, Ser: 2.14 mg/dL — ABNORMAL HIGH (ref 0.44–1.00)
Creatinine, Ser: 2.18 mg/dL — ABNORMAL HIGH (ref 0.44–1.00)
GFR calc Af Amer: 34 mL/min — ABNORMAL LOW (ref >60)
GFR calc Af Amer: 35 mL/min — ABNORMAL LOW (ref >60)
GFR calc Af Amer: 34 mL/min — ABNORMAL LOW (ref >60)
GFR calc non Af Amer: 30 mL/min — ABNORMAL LOW (ref >60)
GFR calc non Af Amer: 30 mL/min — ABNORMAL LOW (ref >60)
GFR calc non Af Amer: 29 mL/min — ABNORMAL LOW (ref >60)
Glucose, Bld: 150 mg/dL — ABNORMAL HIGH (ref 70–99)
Glucose, Bld: 121 mg/dL — ABNORMAL HIGH (ref 70–99)
Glucose, Bld: 141 mg/dL — ABNORMAL HIGH (ref 70–99)
Potassium: 2.1 mmol/L — CL (ref 3.5–5.1)
Potassium: >7.5 mmol/L (ref 3.5–5.1)
Potassium: 3.2 mmol/L — ABNORMAL LOW (ref 3.5–5.1)
Sodium: 145 mmol/L (ref 135–145)
Sodium: 144 mmol/L (ref 135–145)
Sodium: 145 mmol/L (ref 135–145)

## 2019-02-15 LAB — GLUCOSE, CAPILLARY
Glucose-Capillary: 109 mg/dL — ABNORMAL HIGH (ref 70–99)
Glucose-Capillary: 113 mg/dL — ABNORMAL HIGH (ref 70–99)
Glucose-Capillary: 113 mg/dL — ABNORMAL HIGH (ref 70–99)
Glucose-Capillary: 127 mg/dL — ABNORMAL HIGH (ref 70–99)
Glucose-Capillary: 128 mg/dL — ABNORMAL HIGH (ref 70–99)
Glucose-Capillary: 128 mg/dL — ABNORMAL HIGH (ref 70–99)
Glucose-Capillary: 130 mg/dL — ABNORMAL HIGH (ref 70–99)
Glucose-Capillary: 57 mg/dL — ABNORMAL LOW (ref 70–99)

## 2019-02-15 LAB — URINE CULTURE: Culture: NO GROWTH

## 2019-02-15 MED ORDER — POTASSIUM CHLORIDE 20 MEQ PO PACK
40.0000 meq | PACK | Freq: Once | ORAL | Status: AC
  Administered 2019-02-15: 13:00:00 40 meq via ORAL
  Filled 2019-02-15: qty 2

## 2019-02-15 MED ORDER — POTASSIUM CHLORIDE 10 MEQ/50ML IV SOLN
10.0000 meq | INTRAVENOUS | Status: AC
  Administered 2019-02-15 (×4): 10 meq via INTRAVENOUS
  Filled 2019-02-15 (×4): qty 50

## 2019-02-15 MED ORDER — MAGNESIUM SULFATE 2 GM/50ML IV SOLN
2.0000 g | Freq: Once | INTRAVENOUS | Status: AC
  Administered 2019-02-15: 2 g via INTRAVENOUS
  Filled 2019-02-15: qty 50

## 2019-02-15 MED ORDER — POTASSIUM CHLORIDE 20 MEQ PO PACK
20.0000 meq | PACK | ORAL | Status: AC
  Administered 2019-02-15 – 2019-02-16 (×2): 20 meq
  Filled 2019-02-15 (×2): qty 1

## 2019-02-15 MED ORDER — POTASSIUM CHLORIDE 20 MEQ PO PACK: 20.0000 meq | PACK | Freq: Once | ORAL | Status: DC

## 2019-02-15 MED ORDER — POTASSIUM CHLORIDE CRYS ER 20 MEQ PO TBCR
20.0000 meq | EXTENDED_RELEASE_TABLET | ORAL | Status: DC
  Filled 2019-02-15 (×2): qty 1

## 2019-02-15 NOTE — Progress Notes (Signed)
SLP Cancellation Note  Patient Details Name: Marylou Wages MRN: [id number] DOB: 15-Sep-1986   Cancelled treatment:       Reason Eval/Treat Not Completed: Patient not medically ready. Discussed with RN. Pts mental status is improving, but still severely compromised. Will follow for readiness to resume POs.   Herbie Baltimore, MA CCC-SLP  Acute Rehabilitation Services Pager [phone] Office [phone]   Lynann Beaver 02/15/2019, 10:48 AM

## 2019-02-15 NOTE — Evaluation (Signed)
Physical Therapy Evaluation Patient Details Name: Molly [name] MRN: [id number] DOB: 1986/10/11 Today's Date: 02/15/2019   History of Present Illness  Pt adm for acute respiratory failure due to Septic pulmonary embolus due to MRSA tricuspid vegetation. Pt intubated 01/20/19. Pt with multiple seizures 4/28-4/29. PMH - polysubstance abuse, inadequately treated MRSA endocarditis having signed out AMA  in July 2019, osteomyelitis of spine  Clinical Impression  Pt re-evaluated. Decr mobility after seizures. If pt can eventually come off vent possible CIR consult.     Follow Up Recommendations CIR    Equipment Recommendations  Other (comment)(To be determined)    Recommendations for Other Services       Precautions / Restrictions Precautions Precautions: Fall;Other (comment) Precaution Comments: vent Restrictions Weight Bearing Restrictions: No      Mobility  Bed Mobility Overal bed mobility: Needs Assistance Bed Mobility: Supine to Sit;Rolling;Sit to Supine Rolling: Total assist   Supine to sit: +2 for safety/equipment;Total assist Sit to supine: +2 for physical assistance;Total assist   General bed mobility comments: Assist for all aspects  Transfers                    Ambulation/Gait                Stairs            Wheelchair Mobility    Modified Rankin (Stroke Patients Only)       Balance Overall balance assessment: Needs assistance Sitting-balance support: Bilateral upper extremity supported;Feet unsupported Sitting balance-Leahy Scale: Zero Sitting balance - Comments: Sat EOB x 3-4 minutes with total assist.                                     Pertinent Vitals/Pain Pain Assessment: Faces Faces Pain Scale: Hurts even more Pain Location: Back, buttocks Pain Descriptors / Indicators: Grimacing Pain Intervention(s): Limited activity within patient's tolerance;Monitored during session;Repositioned    Home Living  Family/patient expects to be discharged to:: Private residence Living Arrangements: Parent                    Prior Function Level of Independence: Independent               Hand Dominance        Extremity/Trunk Assessment   Upper Extremity Assessment Upper Extremity Assessment: Defer to OT evaluation    Lower Extremity Assessment Lower Extremity Assessment: RLE deficits/detail;LLE deficits/detail RLE Deficits / Details: Pt with some movement of RLE but significantly less than LLE LLE Deficits / Details: Active movement against gravity       Communication   Communication: Tracheostomy  Cognition Arousal/Alertness: Awake/alert Behavior During Therapy: Restless Overall Cognitive Status: Difficult to assess Area of Impairment: Following commands;Problem solving                       Following Commands: Follows one step commands consistently;Follows one step commands with increased time     Problem Solving: Slow processing;Decreased initiation;Requires verbal cues;Requires tactile cues General Comments: Incr time required for instructions      General Comments      Exercises     Assessment/Plan    PT Assessment Patient needs continued PT services  PT Problem List Decreased strength;Decreased activity tolerance;Decreased balance;Decreased mobility;Decreased cognition;Cardiopulmonary status limiting activity       PT Treatment Interventions DME instruction;Gait training;Functional mobility training;Therapeutic activities;Therapeutic exercise;Balance training;Cognitive  remediation;Patient/family education;Neuromuscular re-education    PT Goals (Current goals can be found in the Care Plan section)  Acute Rehab PT Goals Patient Stated Goal: Pt unable PT Goal Formulation: Patient unable to participate in goal setting Time For Goal Achievement: 03/01/19 Potential to Achieve Goals: Fair    Frequency Min 3X/week   Barriers to discharge         Co-evaluation PT/OT/SLP Co-Evaluation/Treatment: Yes Reason for Co-Treatment: Complexity of the patient's impairments (multi-system involvement);For patient/therapist safety           AM-PAC PT "6 Clicks" Mobility  Outcome Measure Help needed turning from your back to your side while in a flat bed without using bedrails?: Total Help needed moving from lying on your back to sitting on the side of a flat bed without using bedrails?: Total Help needed moving to and from a bed to a chair (including a wheelchair)?: Total Help needed standing up from a chair using your arms (e.g., wheelchair or bedside chair)?: Total Help needed to walk in hospital room?: Total Help needed climbing 3-5 steps with a railing? : Total 6 Click Score: 6    End of Session Equipment Utilized During Treatment: (vent) Activity Tolerance: Patient limited by fatigue;Treatment limited secondary to medical complications (Comment) Patient left: in bed;with nursing/sitter in room Nurse Communication: Mobility status PT Visit Diagnosis: Other abnormalities of gait and mobility (R26.89);Muscle weakness (generalized) (M62.81);Unsteadiness on feet (R26.81)    Time: 4193-7902 PT Time Calculation (min) (ACUTE ONLY): 25 min   Charges:   PT Evaluation $PT Eval Moderate Complexity: [place] Pager [phone] Office [place] 02/15/2019, 1:58 PM

## 2019-02-15 NOTE — Progress Notes (Signed)
Reason for consult: PRES/seizures  Subjective: Patient is much more alert, follows commands intermittently.  She appears to be tracking and fixes gaze on examiner.  Moving left side more than the right side.   ROS: Limited due to patient's mental status  Examination  Vital signs in last 24 hours: Temp:  [97.3 F (36.3 C)-102.4 F (39.1 C)] 99.9 F (37.7 C) (04/30 0500) Pulse Rate:  [95-131] 108 (04/30 0600) Resp:  [0-23] 0 (04/30 0600) BP: (94-125)/(66-109) 117/97 (04/30 0600) SpO2:  [97 %-100 %] 99 % (04/30 0600) FiO2 (%):  [30 %] 30 % (04/30 0400) Weight:  [54.6 kg] 54.6 kg (04/30 0500)  General: lying in bed CVS: pulse-normal rate and rhythm RS: breathing comfortably Extremities: normal, mild scabs in both feet   Neuro: MS: Alert, oriented, follows commands, tracks examiner CN: pupils equal and reactive,  EOMI, face symmetric, tongue midline, visual fields: Blinks 4/5 strength in left upper and right lower extremity, 3 x 5 strength in right upper and lower extremity Reflexes: 2+ bilaterally over patella, plantars: flexor Coordination: Unable to test Gait: not possible at this time to test   Basic Metabolic Panel: Recent Labs  Lab 02/09/19 0602 02/10/19 0524 02/11/19 0640  02/11/19 1800 02/12/19 0345 02/13/19 0431 02/13/19 0941 02/14/19 1035  NA 145 142 142   < > 139 141 143 145 144  K 3.7 3.2* 3.4*   < > 3.7 3.3* 3.6 4.0 3.1*  CL 109 107 108  --  104 108 110  --  111  CO2 28 26 27   --  25 25 22   --  24  GLUCOSE 98 99 92  --  117* 102* 122*  --  119*  BUN 87* 71* 61*  --  59* 57* 50*  --  57*  CREATININE 1.98* 1.98* 1.78*  --  1.92* 1.93* 1.87*  --  2.24*  CALCIUM 7.9* 7.7* 7.6*  --  7.7* 7.7* 7.6*  --  7.3*  MG 1.7 1.6* 1.6*  --  2.2 2.1 1.8  --  1.7  PHOS 3.2 3.3 3.7  --  3.8  --   --   --   --    < > = values in this interval not displayed.    CBC: Recent Labs  Lab 02/11/19 0640  02/11/19 1800 02/12/19 0345 02/13/19 0431 02/13/19 0941  02/14/19 1035  WBC 3.6*  --  6.5 5.6 6.0  --  8.5  NEUTROABS  --   --   --  4.1  --   --  6.0  HGB 7.7*   < > 9.3* 8.0* 8.6* 9.9* 7.8*  HCT 25.3*   < > 30.3* 25.0* 27.5* 29.0* 24.6*  MCV 90.4  --  90.2 87.7 89.0  --  89.5  PLT 105*  --  142* 131* 147*  --  153   < > = values in this interval not displayed.     Coagulation Studies: No results for input(s): LABPROT, INR in the last 72 hours.  Imaging Reviewed:     ASSESSMENT AND PLAN  33 year old female with IV drug abuse and MRSA endocarditis,sacral osteomyelitis,C. difficile consulted for seizure-like activity. She had 2 seizures on 4/26 there was generalized tonic-clonic with gaze deviation with a return to baseline. She was loaded with Keppra and has not had any further seizures. A stat CT head was performed which was concerning for PRES.MRI Brain consistent with PRES.Routine EEG performed:No seizure activity seen.  Patient had seizure on morning of 4/28  and then went into status epilepticus.  Seizure characterized by right arm jerking and downward gaze deviation lasting >10 min. Received 2mg  of Ativan x2 and resolved for few min without return to baseline followed by return to focal status epilepticus. Patient was loaded with Dilantin and connected to long-term EEG monitoring.  Status epilepticus resolved with Dilantin and EEG showed frequent right parieto temporal spikes, but no seizures.  Keppra dose was increased and she is on Dilantin maintenance dosing.   She has had no further clinical or electrographic seizures since Dilantin as started.  Appears lethargic this morning, not following commands, likely due to medications. EEG showed left parietal temporal spikes, did not progress to seizures.   Statusepilepticus - resolved  Posterior reversible encephalopathy syndrome MRSA bacteremia/endocarditis  Recommendations -CONTINUE  Dilantin appreciate pharmacy assistance in dosing, follow-up on levels 22.5  -  CONTINUEe Keppra to 1g BID ( patient only 54kg) - Seizure precuations - Close neurochecks -Continue BP goal less than 140/90 mmHg, management per ICU -Avoid antiplatelets -Continue to treat bacteremia  This patient is neurologically critically ill due to status epilepticus (resolved), PR ES-she is a high risk for neurological worsening due to worsening seizures, status epilepticus, cerebral edema, complications of prolonged ICU stay, complications of multiple seizure medications, aspiration risk, respiratory failure, cardiac arrest and sudden death in epilepsy.  I spent 35 minutes of neuro critical care time with this patient including reviewing EEG, reviewing multiple databases, monitoring vital signs, lab results, neurological exam, coordation with critical care team/and with family members.    Molly [name] [facility] Pager Number [phone] For questions after 7pm please refer to AMION to reach the Neurologist on call

## 2019-02-15 NOTE — Evaluation (Signed)
Occupational Therapy Evaluation Patient Details Name: Molly [name] MRN: [id number] DOB: 03/03/1986 Today's Date: 02/15/2019    History of Present Illness Pt adm for acute respiratory failure due to Septic pulmonary embolus due to MRSA tricuspid vegetation. Pt intubated 01/20/19. Pt with multiple seizures 4/28-4/29. 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 Pt with decreased command follow and poor activity tolerance. R sided weakness noted. Pt sitting EOB today and not assisting at this time. SOB noted >90% O2; HR 116 BPM. Pt required ventilation after PT/OT session as labored breathing after repositioned in bed. Pt would greatly benefit from continued OT skilled services for ADL,mobility and safety in CIR setting. OT to follow acutely.    Follow Up Recommendations  CIR;Supervision/Assistance - 24 hour    Equipment Recommendations  None recommended by OT    Recommendations for Other Services       Precautions / Restrictions Precautions Precautions: Fall;Other (comment) Precaution Comments: vent Restrictions Weight Bearing Restrictions: No      Mobility Bed Mobility Overal bed mobility: Needs Assistance Bed Mobility: Supine to Sit;Rolling;Sit to Supine Rolling: Total assist   Supine to sit: +2 for safety/equipment;Total assist Sit to supine: +2 for physical assistance;Total assist   General bed mobility comments: Assist for all aspects  Transfers         Stand pivot transfers: (DNT)            Balance Overall balance assessment: Needs assistance Sitting-balance support: Bilateral upper extremity supported;Feet unsupported Sitting balance-Leahy Scale: Zero Sitting balance - Comments: Sat EOB x 3-4 minutes with total assist.                                   ADL either performed or assessed with clinical  judgement   ADL Overall ADL's : Needs assistance/impaired Eating/Feeding: NPO   Grooming: Moderate assistance;Bed level   Upper Body Bathing: Maximal assistance;Bed level   Lower Body Bathing: Total assistance;Bed level   Upper Body Dressing : Moderate assistance   Lower Body Dressing: Total assistance;Bed level   Toilet Transfer: Total assistance   Toileting- Clothing Manipulation and Hygiene: Total assistance;Sitting/lateral lean       Functional mobility during ADLs: Moderate assistance;+2 for physical assistance;+2 for safety/equipment(to sit EOB) General ADL Comments: Sitting EOB with maxA for support and relieved to modA for sitting upright.     Vision Baseline Vision/History: Wears glasses Vision Assessment?: Vision impaired- to be further tested in functional context Additional Comments: R side neglect appears to be less focused on R having to turn her head and using L eye as scanner.     Perception     Praxis      Pertinent Vitals/Pain Pain Assessment: Faces Faces Pain Scale: Hurts even more Pain Location: Back, buttocks Pain Descriptors / Indicators: Grimacing Pain Intervention(s): Limited activity within patient's tolerance     Hand Dominance     Extremity/Trunk Assessment Upper Extremity Assessment Upper Extremity Assessment: Generalized weakness;RUE deficits/detail;LUE deficits/detail RUE Deficits / Details: 2+/5 mm grade in shoulder region RUE Coordination: decreased fine motor;decreased gross motor LUE Deficits / Details: 3-/5 MM grade shoulder region   Lower Extremity Assessment Lower Extremity Assessment: RLE deficits/detail;LLE deficits/detail RLE Deficits / Details: Pt with some movement of RLE but significantly less than LLE LLE Deficits / Details: Active movement against gravity  Cervical / Trunk Assessment Cervical / Trunk Assessment: Normal   Communication Communication Communication: Tracheostomy   Cognition Arousal/Alertness:  Awake/alert Behavior During Therapy: Restless Overall Cognitive Status: Difficult to assess Area of Impairment: Following commands;Problem solving                       Following Commands: Follows one step commands consistently;Follows one step commands with increased time     Problem Solving: Slow processing;Decreased initiation;Requires verbal cues;Requires tactile cues General Comments: Incr time required for instructions   General Comments  Pt with labored breathing throughout tasks and RT called for pt to return on vent.    Exercises     Shoulder Instructions      Home Living Family/patient expects to be discharged to:: Private residence Living Arrangements: Parent                                      Prior Functioning/Environment Level of Independence: Independent                 OT Problem List: Decreased strength;Decreased range of motion;Decreased activity tolerance;Impaired balance (sitting and/or standing);Decreased knowledge of use of DME or AE;Decreased knowledge of precautions;Pain      OT Treatment/Interventions: Self-care/ADL training;Therapeutic exercise;Energy conservation;DME and/or AE instruction;Therapeutic activities;Patient/family education;Balance training    OT Goals(Current goals can be found in the care plan section) Acute Rehab OT Goals Patient Stated Goal: Pt unable OT Goal Formulation: With patient Time For Goal Achievement: 03/01/19 Potential to Achieve Goals: Good  OT Frequency: Min 3X/week   Barriers to D/C:            Co-evaluation PT/OT/SLP Co-Evaluation/Treatment: Yes Reason for Co-Treatment: Complexity of the patient's impairments (multi-system involvement);To address functional/ADL transfers   OT goals addressed during session: ADL's and self-care      AM-PAC OT "6 Clicks" Daily Activity     Outcome Measure Help from another person eating meals?: Total Help from another person taking care of  personal grooming?: Total Help from another person toileting, which includes using toliet, bedpan, or urinal?: Total Help from another person bathing (including washing, rinsing, drying)?: Total Help from another person to put on and taking off regular upper body clothing?: Total Help from another person to put on and taking off regular lower body clothing?: Total 6 Click Score: 6   End of Session Equipment Utilized During Treatment: Gait belt Nurse Communication: Mobility status  Activity Tolerance: Patient limited by pain;Treatment limited secondary to medical complications (Comment) Patient left: in chair;with nursing/sitter in room  OT Visit Diagnosis: Other abnormalities of gait and mobility (R26.89);Unsteadiness on feet (R26.81);Muscle weakness (generalized) (M62.81);Other symptoms and signs involving cognitive function                Time: 1610-9604 OT Time Calculation (min): 26 min Charges:  OT General Charges $OT Visit: 1 Visit OT Evaluation $OT Eval Moderate Complexity: 1 Mod  Darryl Nestle) Marsa Aris OTR/L Acute Rehabilitation Services Pager: [phone] Office: [phone]   Audie Pinto 02/15/2019, 3:47 PM

## 2019-02-15 NOTE — Progress Notes (Signed)
eLink Physician-Brief Progress Note Patient Name: Dempsey Knotek DOB: July 24, 1986 MRN: [id number]   Date of Service  02/15/2019  HPI/Events of Note  K+ 3.2, Creatinine 2.18  eICU Interventions  KCL 20 meq via NGT Q 4 hours x 2 doses        Kerry Kass Ogan 02/15/2019, 10:48 PM

## 2019-02-15 NOTE — Progress Notes (Signed)
NAME:  Molly [name], MRN:  [id number], DOB:  1986-05-08, LOS: 65 ADMISSION DATE:  01/20/2019, CONSULTATION DATE: 01/20/2019 REFERRING MD:  Bonner Puna, CHIEF COMPLAINT:  Sepsis  Brief History   33 y/o F with a history of IV drug abuse/polysubstance abuse (presented with urine drug screen positive for opiates and amphetamines) and a previous history in June or July 2019 of inadequately treated MRSA endocarditis having signed out AMA at that time.  She had a tricuspid vegetation at that time.  Admitted 4/4 hypotensive and in respiratory distress requiring intubation at [facility] long.   Admitted for acute respiratory failure/septic shock/ AKI due to septic emboli with MSSA bacteremia.  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 Awake / FC. Looks best in many days. Failed SBT 4/12 Failed SBT.  AKI improving, tx PRBC.  Less alert on precedex, methadone  4/13 More alert, FC. On precedex. Failed SBT. ID recommending PICC for antibiotcs > mother not sure about goals of care. S/p 1 unit PRBC   4/17 Trach placement 4/21 tolerating trach collar trials. 4/23 cdiff result positive overnight with continued diarrhea 4/26 seizure 4/28 status epilepticus  Consults:  Nephrology Neurology  Procedures:  ETT 4/4 >> 4/17 R Femoral line 4/4 >> 4/8 R IJ Tunneled CVC (IR) 4/21 >>  Trach 4/17 >>  Significant Diagnostic Tests:  Echocardiogram 4/4 >> TV vegetation measures 1.5 mm x 0.6 mm., severe TR CT Chest 4/4 >> bilateral  pulmonary nodules, peripheral and cavitated.  LE venous duplex 4/20 >> negative MRI brain 4/26 >> scattered T2/FLAIR most in parieto-occipital region b/l EEG 4/27 >> no evidence of seizures EEG 4/29>>  Micro Data:  Blood cultures x2 4/4 >> MSSA  BCx2 4/8 >> negative   C diff 4/22 >> antigen positive, toxin negative, PCR positive  Antimicrobials:  Vancomycin Zosyn Teflaro >> stopped Ancef 4/4 >>  Diflucan 4/21 >> 4/24 Vancomycin po 4/23 >>   Interim history/subjective:  Awake alert follows commands requesting pain medication  Objective   Blood pressure (!) 117/97, pulse (!) 116, temperature 99.9 F (37.7 C), resp. rate (!) 22, height 5\' 1"  (1.549 m), weight 54.6 kg, SpO2 99 %.    Vent Mode: PRVC FiO2 (%):  [30 %-40 %] 40 % Set Rate:  [15 bmp] 15 bmp Vt Set:  [450 mL] 450 mL PEEP:  [5 cmH20] 5 cmH20 Plateau Pressure:  [14 cmH20-18 cmH20] 14 cmH20   Intake/Output Summary (Last 24 hours) at 02/15/2019 4132 Last data filed at 02/15/2019 0500 Gross per 24 hour  Intake 896.22 ml  Output 1710 ml  Net -813.78 ml   Filed Weights   02/10/19 0345 02/14/19 0600 02/15/19 0500  Weight: 54.5 kg 60.4 kg 54.6 kg   General: Awake alert follows commands somewhat disheveled in appearance HEENT: Tracheostomy is unremarkable, core track is in place Neuro: Follows commands nods appropriately nods yes to I am in pain CV: s1s2 rrr, no m/r/g PULM: even/non-labored, lungs bilaterally diminished GM:WNUU, non-tender, bsx4 active  Extremities: warm/dry, negative edema  Skin: no rashes or lesions  Resolved Hospital Problem list   Septic Shock, thrush, AKI  Active Problem list    New onset seizure 4/26 Status epilepticus 4/28 MRI showing progress Antiepileptics per neurology Currently on Dilantin and Keppra Seizure precautions Appreciate neurology's input  Hypertension Lopressor Labetalol Goal systolic blood pressure less than 140  MSSA bacteremia with tricuspid valve endocarditis  Continue Ancef stop date of 03/06/2019  C. difficile colitis -Enteral vancomycin until 5/2  Tracheostomy status Trach as tolerated  Severe protein calorie malnutrition Dysphagia Continue tube feeding Swallowing evaluation in future   Hypokalemia Recent Labs  Lab 02/13/19 0431 02/13/19 0941 02/14/19 1035  K 3.6 4.0 3.1*    Replete as needed Check potassium 02/15/2019  Anemia of chronic illness Recent Labs    02/13/19 0941 02/14/19 1035  HGB 9.9* 7.8*    Monitor CBC  Best practice:  Diet: tube feeds DVT prophylaxis: SQ heparin GI prophylaxis: protonix Mobility: bed rest Code Status: Limited code Disposition: ICU  Labs:   CMP Latest Ref Rng & Units 02/14/2019 02/13/2019 02/13/2019  Glucose 70 - 99 mg/dL 119(H) - 122(H)  BUN 6 - 20 mg/dL 57(H) - 50(H)  Creatinine 0.44 - 1.00 mg/dL 2.24(H) - 1.87(H)  Sodium 135 - 145 mmol/L 144 145 143  Potassium 3.5 - 5.1 mmol/L 3.1(L) 4.0 3.6  Chloride 98 - 111 mmol/L 111 - 110  CO2 22 - 32 mmol/L 24 - 22  Calcium 8.9 - 10.3 mg/dL 7.3(L) - 7.6(L)  Total Protein 6.5 - 8.1 g/dL - - 7.5  Total Bilirubin 0.3 - 1.2 mg/dL - - 0.4  Alkaline Phos 38 - 126 U/L - - 97  AST 15 - 41 U/L - - 14(L)  ALT 0 - 44 U/L - - <5   CBC Latest Ref Rng & Units 02/14/2019 02/13/2019 02/13/2019  WBC 4.0 - 10.5 K/uL 8.5 - 6.0  Hemoglobin 12.0 - 15.0 g/dL 7.8(L) 9.9(L) 8.6(L)  Hematocrit 36.0 - 46.0 % 24.6(L) 29.0(L) 27.5(L)  Platelets 150 - 400 K/uL 153 - 147(L)   ABG    Component Value Date/Time   PHART 7.439 02/13/2019 0941   PCO2ART 30.5 (L) 02/13/2019 0941   PO2ART 115.0 (H) 02/13/2019 0941   HCO3 20.7 02/13/2019 0941   TCO2 22 02/13/2019 0941   ACIDBASEDEF 3.0 (H) 02/13/2019 0941   O2SAT 99.0 02/13/2019 0941   CBG (last 3)  Recent Labs    02/15/19 0008 02/15/19 0353 02/15/19 0747  GLUCAP 128* 128* 113*   App CCT 30 min   Richardson Landry Tannah Dreyfuss ACNP [facility] PCCM Pager [phone] till 1 pm If no answer page [phone] 02/15/2019, 9:08  AM

## 2019-02-16 ENCOUNTER — Inpatient Hospital Stay (HOSPITAL_COMMUNITY): Payer: Medicaid Other

## 2019-02-16 LAB — BASIC METABOLIC PANEL
Anion gap: 11 (ref 5–15)
BUN: 68 mg/dL — ABNORMAL HIGH (ref 6–20)
CO2: 21 mmol/L — ABNORMAL LOW (ref 22–32)
Calcium: 7.3 mg/dL — ABNORMAL LOW (ref 8.9–10.3)
Chloride: 113 mmol/L — ABNORMAL HIGH (ref 98–111)
Creatinine, Ser: 2.05 mg/dL — ABNORMAL HIGH (ref 0.44–1.00)
GFR calc Af Amer: 36 mL/min — ABNORMAL LOW (ref >60)
GFR calc non Af Amer: 31 mL/min — ABNORMAL LOW (ref >60)
Glucose, Bld: 123 mg/dL — ABNORMAL HIGH (ref 70–99)
Potassium: 3.1 mmol/L — ABNORMAL LOW (ref 3.5–5.1)
Sodium: 145 mmol/L (ref 135–145)

## 2019-02-16 LAB — PHENYTOIN LEVEL, TOTAL: Phenytoin Lvl: 20.8 ug/mL — ABNORMAL HIGH (ref 10.0–20.0)

## 2019-02-16 LAB — CBC
HCT: 24 % — ABNORMAL LOW (ref 36.0–46.0)
Hemoglobin: 7.2 g/dL — ABNORMAL LOW (ref 12.0–15.0)
MCH: 27.6 pg (ref 26.0–34.0)
MCHC: 30 g/dL (ref 30.0–36.0)
MCV: 92 fL (ref 80.0–100.0)
Platelets: 96 10*3/uL — ABNORMAL LOW (ref 150–400)
RBC: 2.61 MIL/uL — ABNORMAL LOW (ref 3.87–5.11)
RDW: 16.9 % — ABNORMAL HIGH (ref 11.5–15.5)
WBC: 4.3 10*3/uL (ref 4.0–10.5)
nRBC: 0 % (ref 0.0–0.2)

## 2019-02-16 LAB — GLUCOSE, CAPILLARY
Glucose-Capillary: 107 mg/dL — ABNORMAL HIGH (ref 70–99)
Glucose-Capillary: 110 mg/dL — ABNORMAL HIGH (ref 70–99)
Glucose-Capillary: 115 mg/dL — ABNORMAL HIGH (ref 70–99)
Glucose-Capillary: 117 mg/dL — ABNORMAL HIGH (ref 70–99)
Glucose-Capillary: 122 mg/dL — ABNORMAL HIGH (ref 70–99)
Glucose-Capillary: 194 mg/dL — ABNORMAL HIGH (ref 70–99)

## 2019-02-16 LAB — PHOSPHORUS: Phosphorus: 2.5 mg/dL (ref 2.5–4.6)

## 2019-02-16 LAB — MAGNESIUM: Magnesium: 2 mg/dL (ref 1.7–2.4)

## 2019-02-16 MED ORDER — POTASSIUM CHLORIDE 20 MEQ/15ML (10%) PO SOLN
20.0000 meq | ORAL | Status: AC
  Administered 2019-02-16 (×2): 20 meq
  Filled 2019-02-16 (×2): qty 15

## 2019-02-16 NOTE — Progress Notes (Signed)
eLink Physician-Brief Progress Note Patient Name: Molly [name] DOB: 06-17-1986 MRN: [id number]   Date of Service  02/16/2019  HPI/Events of Note  K+ 3.1  eICU Interventions  KCL 20 meq via NGT Q 4 hours x 2 doses        Okoronkwo U Ogan 02/16/2019, 5:24 AM

## 2019-02-16 NOTE — Progress Notes (Signed)
Pt had mittens on - asked for them to be loosened a little - skin intact looks good by 1000 pt had taked mittens off - pulled off trach collar with oxygen - pulled at velcro securing trach and trying to pull out cortrak feeding tube - have asked pt repeatedly this am not to pull at her tubes and neck area - explained to her multiple times why she should not pull at tubes

## 2019-02-16 NOTE — Progress Notes (Addendum)
Pt even with mittens on pulled trach collar oxygen off and rubbing cortrak tube - Will use PRN meds to help pt relax but not sedated - MD ordered soft restraints  for safety - pt given clear liquids made to nectar thick  - tolerated well Pt says she's lactose intolerant - verified by mother per phone when she called for update

## 2019-02-16 NOTE — Progress Notes (Signed)
NAME:  Molly [name], MRN:  [id number], DOB:  21-Mar-1986, LOS: 33 ADMISSION DATE:  01/20/2019, CONSULTATION DATE: 01/20/2019 REFERRING MD:  Bonner Puna, CHIEF COMPLAINT:  Sepsis  Brief History   33 y/o F with a history of IV drug abuse/polysubstance abuse (presented with urine drug screen positive for opiates and amphetamines) and a previous history in June or July 2019 of inadequately treated MRSA endocarditis having signed out AMA at that time.  She had a tricuspid vegetation at that time.  Admitted 4/4 hypotensive and in respiratory distress requiring intubation at [facility] long.   Admitted for acute respiratory failure/septic shock/ AKI due to septic emboli with MSSA bacteremia.  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 Awake / FC. Looks best in many days. Failed SBT 4/12 Failed SBT.  AKI improving, tx PRBC.  Less alert on precedex, methadone  4/13 More alert, FC. On precedex. Failed SBT. ID recommending PICC for antibiotcs > mother not sure about goals of care. S/p 1 unit PRBC   4/17 Trach placement 4/21 tolerating trach collar trials. 4/23 cdiff result positive overnight with continued diarrhea 4/26 seizure 4/28 status epilepticus  Consults:  Nephrology Neurology  Procedures:  ETT 4/4 >> 4/17 R Femoral line 4/4 >> 4/8 R IJ Tunneled CVC (IR) 4/21 >>  Trach 4/17 >>  Significant Diagnostic Tests:  Echocardiogram 4/4 >> TV vegetation measures 1.5 mm x 0.6 mm., severe TR CT Chest 4/4 >> bilateral  pulmonary nodules, peripheral and cavitated.  LE venous duplex 4/20 >> negative MRI brain 4/26 >> scattered T2/FLAIR most in parieto-occipital region b/l EEG 4/27 >> no evidence of seizures   Micro Data:  Blood cultures x2 4/4 >> MSSA  BCx2 4/8 >> negative   C diff 4/22 >> antigen positive, toxin negative, PCR positive  Antimicrobials:  Vancomycin Zosyn Teflaro >> stopped Ancef 4/4 >>  Diflucan 4/21 >> 4/24 Vancomycin po 4/23 >>   Interim history/subjective:  Awake alert follows commands requesting pain medication  Objective   Blood pressure (!) 107/94, pulse (!) 112, temperature (!) 97.5 F (36.4 C), temperature source Bladder, resp. rate (!) 27, height 5\' 1"  (1.549 m), weight 57.1 kg, SpO2 100 %.    Vent Mode: PRVC FiO2 (%):  [30 %-40 %] 35 % Set Rate:  [15 bmp] 15 bmp Vt Set:  [450 mL] 450 mL PEEP:  [5 cmH20] 5 cmH20 Plateau Pressure:  [13 cmH20-18 cmH20] 17 cmH20   Intake/Output Summary (Last 24 hours) at 02/16/2019 0810 Last data filed at 02/16/2019 0600 Gross per 24 hour  Intake 2923.92 ml  Output 2825 ml  Net 98.92 ml   Filed Weights   02/14/19 0600 02/15/19 0500 02/16/19 0425  Weight: 60.4 kg 54.6 kg 57.1 kg   General: Cachectic female who is much more awake and interactive today HEENT: Currently on trach collar awake alert follows commands Neuro: Awake and alert follows commands. CV: Heart sounds are regular rate and rate rhythm PULM: even/non-labored, lungs bilaterally diminished in bases KD:XIPJ, non-tender, bsx4 active, continues to have diarrhea Extremities: warm/dry, negative edema  Skin: No changes sacral decubitus    Resolved Hospital Problem list   Septic Shock, thrush, AKI  Active Problem list    New onset seizure 4/26 Status epilepticus 4/28 MRI findings as noted Continue antiseizure medications per neurology currently on Dilantin and Keppra Appreciate neurology's input   Hypertension Continue Lopressor and PRN labetalol Labetalol  Systolic blood pressures less than 140 per neurology's recommendation secondary to pres syndrome  MSSA bacteremia with tricuspid valve endocarditis Continue Ancef per infectious disease until 03/06/2019  C. difficile colitis Continue enteral vancomycin until 02/17/2019  Tracheostomy status Trach collar as tolerated tolerated for 6 hours on 01/19/2019 goal on 02/16/2019 is as tolerated  Severe protein calorie malnutrition Dysphagia Continue tube feeding Swallow eval for resumption of oral feeds   Hypokalemia Recent Labs  Lab 02/15/19 2046 02/15/19 2149 02/16/19 0346  K >7.5* 3.2* 3.1*    Continue to replete potassium as needed Serial chemistries  Anemia of chronic illness Recent Labs    02/15/19 1038 02/16/19 0346  HGB 7.3* 7.2*    Monitor CBC  Best practice:  Diet: tube feeds DVT prophylaxis: SQ heparin GI prophylaxis: protonix Mobility: bed rest Code Status: Limited code Disposition: ICU  Labs:   CMP Latest Ref Rng & Units 02/16/2019 02/15/2019 02/15/2019  Glucose 70 - 99 mg/dL 123(H) 141(H) 121(H)  BUN 6 - 20 mg/dL 68(H) 69(H) 68(H)  Creatinine 0.44 - 1.00 mg/dL 2.05(H) 2.18(H) 2.14(H)  Sodium 135 - 145 mmol/L 145 145 144  Potassium 3.5 - 5.1 mmol/L 3.1(L) 3.2(L) >7.5(HH)  Chloride 98 - 111 mmol/L 113(H) 114(H) 115(H)  CO2 22 - 32 mmol/L 21(L) 19(L) 20(L)  Calcium 8.9 - 10.3 mg/dL 7.3(L) 7.1(L) 7.1(L)  Total Protein 6.5 - 8.1 g/dL - - -  Total Bilirubin 0.3 - 1.2 mg/dL - - -  Alkaline Phos 38 - 126 U/L - - -  AST 15 - 41 U/L - - -  ALT 0 - 44 U/L - - -   CBC Latest Ref Rng & Units 02/16/2019 02/15/2019 02/14/2019  WBC 4.0 - 10.5 K/uL 4.3 6.2 8.5  Hemoglobin 12.0 - 15.0 g/dL 7.2(L) 7.3(L) 7.8(L)  Hematocrit 36.0 - 46.0 % 24.0(L) 23.6(L) 24.6(L)  Platelets 150 - 400 K/uL 96(L) 122(L) 153   ABG    Component Value Date/Time   PHART 7.439 02/13/2019 0941   PCO2ART 30.5 (L) 02/13/2019 0941   PO2ART 115.0 (H) 02/13/2019 0941   HCO3 20.7 02/13/2019 0941   TCO2  22 02/13/2019 0941   ACIDBASEDEF 3.0 (H) 02/13/2019 0941   O2SAT 99.0 02/13/2019 0941   CBG (last 3)  Recent Labs    02/15/19 2309 02/16/19 0416 02/16/19 0718  GLUCAP 109* 117* 115*   App CCT 30 min   Richardson Landry Minor ACNP [facility] PCCM Pager [phone] till 1 pm If no answer page [phone] 02/16/2019, 8:10 AM

## 2019-02-16 NOTE — Progress Notes (Signed)
Reason for consult: Status epilepticus/PR ES  Subjective: Patient has much improved.  Visual fields intact.  Answering questions and requesting to eat.   ROS: negative except above  Examination  Vital signs in last 24 hours: Temp:  [96.6 F (35.9 C)-101.5 F (38.6 C)] 100 F (37.8 C) (05/01 1543) Pulse Rate:  [100-122] 111 (05/01 1543) Resp:  [9-44] 22 (05/01 1543) BP: (101-117)/(80-96) 114/88 (05/01 1400) SpO2:  [93 %-100 %] 97 % (05/01 1543) FiO2 (%):  [30 %-35 %] 30 % (05/01 1543) Weight:  [57.1 kg] 57.1 kg (05/01 0425)  General: lying in bed CVS: pulse-normal rate and rhythm RS: breathing comfortably Extremities: normal   Neuro: MS: Alert, oriented, follows commands CN: pupils equal and reactive,  EOMI, visual fields normal, face symmetric, tongue midline Motor: 4+/5 strength in all 4 extremities Reflexes:plantars: flexor Coordination: normal Gait: not tested  Basic Metabolic Panel: Recent Labs  Lab 02/10/19 0524 02/11/19 0640  02/11/19 1800 02/12/19 0345 02/13/19 0431  02/14/19 1035 02/15/19 1038 02/15/19 2046 02/15/19 2149 02/16/19 0346  NA 142 142   < > 139 141 143   < > 144 145 144 145 145  K 3.2* 3.4*   < > 3.7 3.3* 3.6   < > 3.1* 2.1* >7.5* 3.2* 3.1*  CL 107 108  --  104 108 110  --  111 109 115* 114* 113*  CO2 26 27  --  25 25 22   --  24 21* 20* 19* 21*  GLUCOSE 99 92  --  117* 102* 122*  --  119* 150* 121* 141* 123*  BUN 71* 61*  --  59* 57* 50*  --  57* 57* 68* 69* 68*  CREATININE 1.98* 1.78*  --  1.92* 1.93* 1.87*  --  2.24* 2.15* 2.14* 2.18* 2.05*  CALCIUM 7.7* 7.6*  --  7.7* 7.7* 7.6*  --  7.3* 7.1* 7.1* 7.1* 7.3*  MG 1.6* 1.6*  --  2.2 2.1 1.8  --  1.7  --   --   --  2.0  PHOS 3.3 3.7  --  3.8  --   --   --   --   --   --   --  2.5   < > = values in this interval not displayed.    CBC: Recent Labs  Lab 02/12/19 0345 02/13/19 0431 02/13/19 0941 02/14/19 1035 02/15/19 1038 02/16/19 0346  WBC 5.6 6.0  --  8.5 6.2 4.3  NEUTROABS 4.1   --   --  6.0  --   --   HGB 8.0* 8.6* 9.9* 7.8* 7.3* 7.2*  HCT 25.0* 27.5* 29.0* 24.6* 23.6* 24.0*  MCV 87.7 89.0  --  89.5 89.4 92.0  PLT 131* 147*  --  153 122* 96*     Coagulation Studies: No results for input(s): LABPROT, INR in the last 72 hours.     ASSESSMENT AND PLAN  Statusepilepticus - resolved  Posterior reversible encephalopathy syndrome MRSA bacteremia/endocarditis  Recommendations -Continue Dilantin for 7 days and then STOP if no further seizures  -CONTINUE Keppra to 1g BID ( patient only 54kg) on discharge -ContinueBP goal less than 140/90 mmHg -Avoid antiplatelets -Continue to treat bacteremia - Seizure precuations - Close neurochecks - PT/OT when appropriate   She will need neurology follow-up as outpatient.  Decision to continue/discontinue Keppra need to be determined by outpatient neurologist on follow-up if patient remains seizure-free.   Yuta Cipollone [facility] Pager Number [phone] For questions after 7pm please refer  to AMION to reach the Neurologist on call

## 2019-02-16 NOTE — Progress Notes (Signed)
  Speech Language Pathology Treatment: Dysphagia  Patient Details Name: Molly [name] MRN: [id number] DOB: 17-Mar-1986 Today's Date: 02/16/2019 Time: 7867-5449 SLP Time Calculation (min) (ACUTE ONLY): 17 min  Assessment / Plan / Recommendation Clinical Impression  Pt demonstrates improved arousal, though still intermittently inattentive; requires verbal cueing. She is most responsive to drinking, does not have interest in food and does not fully masticate solids. Trials of nectar thick liquids are tolerated consistent with prior MBS result, pt appears automatic and timely, no coughing elicited. Still will need trach downsized to a 4 cuffed prior to PMSV tolerance. Will initiate nectar thick, full liquid diet and upgrade solids as mentation improves.   HPI HPI: Molly [name] 33 year old female who presented to the emergency department with altered mental status.  Patient was admitted to the hospital with working diagnosis of sepsis with multiorgan failure and possible DIC.  Patient condition deteriorated and required invasive mechanical ventilation 4/4.  CT of the chest was done which was positive for septic emboli and right base pneumonia.  Blood cultures grew MSSA.  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  Patient has history of polysubstance/IV drug abuse.  Patient has been on antibiotic therapy with cefazolin.  Underwent tracheostomy on 2/01, which was complicated by bleeding.      SLP Plan  Continue with current plan of care       Recommendations  Diet recommendations: Nectar-thick liquid Liquids provided via: Cup;Straw Medication Administration: Whole meds with liquid Supervision: Patient able to self feed Compensations: Minimize environmental distractions Postural Changes and/or Swallow Maneuvers: Seated upright 90 degrees;Upright 30-60 min after meal                Plan: Continue with current plan of care       GO               Herbie [place], MA [place] Pager [phone] Office [phone]  Lynann Beaver 02/16/2019, 9:15 AM

## 2019-02-16 NOTE — Progress Notes (Signed)
Pt had 7 inc of stool  episodes around [facility] today - pt repositioned frequently tubed milked flowing well does not need to be flushed total of 1050 cc of stool from 7 a to 7 p shift. Pericare and foley care done each time using Barrier cream to protect skin after cleansing with soap and water & pericare spray. Slight bit of redness noted near Lt labial area - used barrier cream there also.

## 2019-02-16 NOTE — Progress Notes (Signed)
Assisted with video visit between pt & her mother.

## 2019-02-16 NOTE — Progress Notes (Signed)
Assisted tele visit with patient's mother.

## 2019-02-17 ENCOUNTER — Inpatient Hospital Stay (HOSPITAL_COMMUNITY): Payer: Medicaid Other

## 2019-02-17 LAB — COMPREHENSIVE METABOLIC PANEL
ALT: <5 U/L (ref 0–44)
AST: 11 U/L — ABNORMAL LOW (ref 15–41)
Albumin: 1.2 g/dL — ABNORMAL LOW (ref 3.5–5.0)
Alkaline Phosphatase: 87 U/L (ref 38–126)
Anion gap: 11 (ref 5–15)
BUN: 74 mg/dL — ABNORMAL HIGH (ref 6–20)
CO2: 18 mmol/L — ABNORMAL LOW (ref 22–32)
Calcium: 7.3 mg/dL — ABNORMAL LOW (ref 8.9–10.3)
Chloride: 117 mmol/L — ABNORMAL HIGH (ref 98–111)
Creatinine, Ser: 1.94 mg/dL — ABNORMAL HIGH (ref 0.44–1.00)
GFR calc Af Amer: 39 mL/min — ABNORMAL LOW (ref >60)
GFR calc non Af Amer: 34 mL/min — ABNORMAL LOW (ref >60)
Glucose, Bld: 130 mg/dL — ABNORMAL HIGH (ref 70–99)
Potassium: 4.1 mmol/L (ref 3.5–5.1)
Sodium: 146 mmol/L — ABNORMAL HIGH (ref 135–145)
Total Bilirubin: 0.2 mg/dL — ABNORMAL LOW (ref 0.3–1.2)
Total Protein: 6.7 g/dL (ref 6.5–8.1)

## 2019-02-17 LAB — CBC WITH DIFFERENTIAL/PLATELET
Abs Immature Granulocytes: 0.03 10*3/uL (ref 0.00–0.07)
Basophils Absolute: 0 10*3/uL (ref 0.0–0.1)
Basophils Relative: 0 %
Eosinophils Absolute: 0.1 10*3/uL (ref 0.0–0.5)
Eosinophils Relative: 3 %
HCT: 23.4 % — ABNORMAL LOW (ref 36.0–46.0)
Hemoglobin: 7.1 g/dL — ABNORMAL LOW (ref 12.0–15.0)
Immature Granulocytes: 1 %
Lymphocytes Relative: 21 %
Lymphs Abs: 1.1 10*3/uL (ref 0.7–4.0)
MCH: 27.7 pg (ref 26.0–34.0)
MCHC: 30.3 g/dL (ref 30.0–36.0)
MCV: 91.4 fL (ref 80.0–100.0)
Monocytes Absolute: 0.4 10*3/uL (ref 0.1–1.0)
Monocytes Relative: 7 %
Neutro Abs: 3.7 10*3/uL (ref 1.7–7.7)
Neutrophils Relative %: 68 %
Platelets: 109 10*3/uL — ABNORMAL LOW (ref 150–400)
RBC: 2.56 MIL/uL — ABNORMAL LOW (ref 3.87–5.11)
RDW: 17.2 % — ABNORMAL HIGH (ref 11.5–15.5)
WBC: 5.3 10*3/uL (ref 4.0–10.5)
nRBC: 0 % (ref 0.0–0.2)

## 2019-02-17 LAB — BASIC METABOLIC PANEL
Anion gap: 15 (ref 5–15)
BUN: 76 mg/dL — ABNORMAL HIGH (ref 6–20)
CO2: 18 mmol/L — ABNORMAL LOW (ref 22–32)
Calcium: 7.6 mg/dL — ABNORMAL LOW (ref 8.9–10.3)
Chloride: 115 mmol/L — ABNORMAL HIGH (ref 98–111)
Creatinine, Ser: 1.95 mg/dL — ABNORMAL HIGH (ref 0.44–1.00)
GFR calc Af Amer: 39 mL/min — ABNORMAL LOW (ref >60)
GFR calc non Af Amer: 33 mL/min — ABNORMAL LOW (ref >60)
Glucose, Bld: 108 mg/dL — ABNORMAL HIGH (ref 70–99)
Potassium: 3.2 mmol/L — ABNORMAL LOW (ref 3.5–5.1)
Sodium: 148 mmol/L — ABNORMAL HIGH (ref 135–145)

## 2019-02-17 LAB — MAGNESIUM: Magnesium: 1.8 mg/dL (ref 1.7–2.4)

## 2019-02-17 LAB — PHOSPHORUS: Phosphorus: 2 mg/dL — ABNORMAL LOW (ref 2.5–4.6)

## 2019-02-17 LAB — GLUCOSE, CAPILLARY
Glucose-Capillary: 107 mg/dL — ABNORMAL HIGH (ref 70–99)
Glucose-Capillary: 118 mg/dL — ABNORMAL HIGH (ref 70–99)
Glucose-Capillary: 131 mg/dL — ABNORMAL HIGH (ref 70–99)
Glucose-Capillary: 112 mg/dL — ABNORMAL HIGH (ref 70–99)
Glucose-Capillary: 112 mg/dL — ABNORMAL HIGH (ref 70–99)
Glucose-Capillary: 114 mg/dL — ABNORMAL HIGH (ref 70–99)

## 2019-02-17 LAB — PHENYTOIN LEVEL, TOTAL: Phenytoin Lvl: 19.6 ug/mL (ref 10.0–20.0)

## 2019-02-17 MED ORDER — POTASSIUM CHLORIDE 20 MEQ/15ML (10%) PO SOLN
40.0000 meq | ORAL | Status: AC
  Administered 2019-02-17 (×2): 40 meq
  Filled 2019-02-17 (×2): qty 30

## 2019-02-17 MED ORDER — MAGNESIUM SULFATE 2 GM/50ML IV SOLN
2.0000 g | Freq: Once | INTRAVENOUS | Status: AC
  Administered 2019-02-17: 2 g via INTRAVENOUS
  Filled 2019-02-17: qty 50

## 2019-02-17 MED ORDER — POTASSIUM PHOSPHATES 15 MMOLE/5ML IV SOLN
30.0000 mmol | Freq: Once | INTRAVENOUS | Status: AC
  Administered 2019-02-17: 06:00:00 30 mmol via INTRAVENOUS
  Filled 2019-02-17: qty 10

## 2019-02-17 NOTE — Progress Notes (Signed)
NAME:  Molly [name], MRN:  [id number], DOB:  Feb 16, 1986, LOS: 71 ADMISSION DATE:  01/20/2019, CONSULTATION DATE: 01/20/2019 REFERRING MD:  Bonner Puna, CHIEF COMPLAINT:  Sepsis  Brief History   33 y/o F with a history of IV drug abuse/polysubstance abuse (presented with urine drug screen positive for opiates and amphetamines) and a previous history in June or July 2019 of inadequately treated MRSA endocarditis having signed out AMA at that time.  She had a tricuspid vegetation at that time.  Admitted 4/4 hypotensive and in respiratory distress requiring intubation at [facility] long.   Admitted for acute respiratory failure/septic shock/ AKI due to septic emboli with MSSA bacteremia.  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 Awake / FC. Looks best in many days. Failed SBT 4/12 Failed SBT.  AKI improving, tx PRBC.  Less alert on precedex, methadone  4/13 More alert, FC. On precedex. Failed SBT. ID recommending PICC for antibiotcs > mother not sure about goals of care. S/p 1 unit PRBC   4/17 Trach placement 4/21 tolerating trach collar trials. 4/23 cdiff result positive overnight with continued diarrhea 4/26 seizure 4/28 status epilepticus  Consults:  Nephrology Neurology  Procedures:  ETT 4/4 >> 4/17 R Femoral line 4/4 >> 4/8 R IJ Tunneled CVC (IR) 4/21 >>  Trach 4/17 >>  Significant Diagnostic Tests:  Echocardiogram 4/4 >> TV vegetation measures 1.5 mm x 0.6 mm., severe TR CT Chest 4/4 >> bilateral  pulmonary nodules, peripheral and cavitated.  LE venous duplex 4/20 >> negative MRI brain 4/26 >> scattered T2/FLAIR most in parieto-occipital region b/l EEG 4/27 >> no evidence of seizures   Micro Data:  Blood cultures x2 4/4 >> MSSA  BCx2 4/8 >> negative   C diff 4/22 >> antigen positive, toxin negative, PCR positive  Antimicrobials:  Vancomycin Zosyn Teflaro >> stopped Ancef 4/4 >>  Diflucan 4/21 >> 4/24 Vancomycin po 4/23 >> 5/2  Interim history/subjective:  Awake alert follows commands requesting pain medication  Objective   Blood pressure (!) 113/93, pulse (!) 109, temperature 99.7 F (37.6 C), resp. rate 14, height 5\' 1"  (1.549 m), weight 55.6 kg, SpO2 100 %.    Vent Mode: PRVC FiO2 (%):  [30 %-35 %] 30 % Set Rate:  [15 bmp] 15 bmp Vt Set:  [450 mL] 450 mL PEEP:  [5 cmH20] 5 cmH20 Plateau Pressure:  [17 cmH20-19 cmH20] 19 cmH20   Intake/Output Summary (Last 24 hours) at 02/17/2019 0726 Last data filed at 02/17/2019 0600 Gross per 24 hour  Intake 2463.75 ml  Output 2600 ml  Net -136.25 ml   Filed Weights   02/15/19 0500 02/16/19 0425 02/17/19 0500  Weight: 54.6 kg 57.1 kg 55.6 kg   General: Cachectic female currently on full mechanical ventilatory support HEENT: Tracheostomy is placed Neuro: Awake follows commands CV: s1s2 rrr, no m/r/g PULM: even/non-labored, lungs bilaterally rhonchi PN:TIRW, non-tender, bsx4 active  Extremities: warm/dry negative edema  Skin: no rashes or lesions     Resolved Hospital Problem list   Septic Shock, thrush, AKI  Active Problem  list    New onset seizure 4/26 Status epilepticus 4/28 Continue antiseizure medications per neurology  Most like within a week stop Dilantin continue Keppra  Appreciate neurology's input    Hypertension Continue Lopressor.  Labetalol Systolic blood pressure less than 140 per neurology.  MSSA bacteremia with tricuspid valve endocarditis Continue Ancef per infectious disease until 03/06/2019   C. difficile colitis Enteral vancomycin ends today on 02/17/2019  Tracheostomy status Tolerated trach collar 8 hours on 02/16/2019 goal is 24/7 on trach collar  Severe protein calorie malnutrition Dysphagia Continue tube feedings Swallow evaluation noted   Hypokalemia Recent Labs  Lab 02/15/19 2149 02/16/19 0346 02/17/19 0350  K 3.2* 3.1* 3.2*    Receiving K-Phos 02/17/2019 Continue to monitor electrolytes  Anemia of chronic illness Recent Labs    02/16/19 0346 02/17/19 0350  HGB 7.2* 7.1*    Transfuse per protocol  Best practice:  Diet: tube feeds DVT prophylaxis: SQ heparin GI prophylaxis: protonix Mobility: bed rest Code Status: Limited code Disposition: ICU  Labs:   CMP Latest Ref Rng & Units 02/17/2019 02/16/2019 02/15/2019  Glucose 70 - 99 mg/dL 108(H) 123(H) 141(H)  BUN 6 - 20 mg/dL 76(H) 68(H) 69(H)  Creatinine 0.44 - 1.00 mg/dL 1.95(H) 2.05(H) 2.18(H)  Sodium 135 - 145 mmol/L 148(H) 145 145  Potassium 3.5 - 5.1 mmol/L 3.2(L) 3.1(L) 3.2(L)  Chloride 98 - 111 mmol/L 115(H) 113(H) 114(H)  CO2 22 - 32 mmol/L 18(L) 21(L) 19(L)  Calcium 8.9 - 10.3 mg/dL 7.6(L) 7.3(L) 7.1(L)  Total Protein 6.5 - 8.1 g/dL - - -  Total Bilirubin 0.3 - 1.2 mg/dL - - -  Alkaline Phos 38 - 126 U/L - - -  AST 15 - 41 U/L - - -  ALT 0 - 44 U/L - - -   CBC Latest Ref Rng & Units 02/17/2019 02/16/2019 02/15/2019  WBC 4.0 - 10.5 K/uL 5.3 4.3 6.2  Hemoglobin 12.0 - 15.0 g/dL 7.1(L) 7.2(L) 7.3(L)  Hematocrit 36.0 - 46.0 % 23.4(L) 24.0(L) 23.6(L)  Platelets 150 - 400 K/uL 109(L) 96(L) 122(L)   ABG    Component Value Date/Time   PHART 7.439 02/13/2019 0941   PCO2ART 30.5 (L) 02/13/2019 0941   PO2ART 115.0 (H) 02/13/2019 0941   HCO3 20.7 02/13/2019 0941   TCO2 22 02/13/2019 0941   ACIDBASEDEF 3.0 (H) 02/13/2019 0941   O2SAT 99.0 02/13/2019 0941   CBG (last 3)  Recent Labs    02/16/19 1935 02/16/19 2331 02/17/19 0329  GLUCAP 110* 107* 107*   App CCT 30 min   Richardson Landry   ACNP Maryanna Shape PCCM Pager [phone] till 1 pm If no answer page [phone] 02/17/2019, 7:26 AM

## 2019-02-17 NOTE — Progress Notes (Signed)
Video call with  Mother, Jan completed.

## 2019-02-17 NOTE — Progress Notes (Signed)
MEDICATION RELATED CONSULT NOTE - INITIAL   Pharmacy Consult for Phenytoin Indication: Status epilepticus  Allergies  Allergen Reactions  . Lactose Intolerance (Gi)   . Vancomycin Other (See Comments)    Possible contributor to AKI and thrombocytopenia    Patient Measurements: Height: 5\' 1"  (154.9 cm) Weight: 122 lb 9.2 oz (55.6 kg) IBW/kg (Calculated) : 47.8  Vital Signs: Temp: 99.4 F (37.4 C) (05/02 1207) Temp Source: Axillary (05/02 1207) BP: 104/91 (05/02 1136) Pulse Rate: 108 (05/02 1136) Intake/Output from previous day: 05/01 0701 - 05/02 0700 In: 2809.7 [P.O.:120; I.V.:20; NG/GT:1740; IV Piggyback:729.7] Out: 2600 [Urine:1550; Stool:1050] Intake/Output from this shift: Total I/O In: 451.8 [NG/GT:180; IV Piggyback:271.8] Out: 285 [Urine:285]  Labs: Recent Labs    02/15/19 1038  02/16/19 0346 02/17/19 0350 02/17/19 0745  WBC 6.2  --  4.3 5.3  --   HGB 7.3*  --  7.2* 7.1*  --   HCT 23.6*  --  24.0* 23.4*  --   PLT 122*  --  96* 109*  --   CREATININE 2.15*   < > 2.05* 1.95* 1.94*  MG  --   --  2.0 1.8  --   PHOS  --   --  2.5 2.0*  --   ALBUMIN  --   --   --   --  1.2*  PROT  --   --   --   --  6.7  AST  --   --   --   --  11*  ALT  --   --   --   --  <5  ALKPHOS  --   --   --   --  87  BILITOT  --   --   --   --  0.2*   < > = values in this interval not displayed.   Estimated Creatinine Clearance: 31.7 mL/min (A) (by C-G formula based on SCr of 1.94 mg/dL (H)).  Assessment: Pt is a 28 yoF who was having seizures this morning then developed status. She was previously on keppra for seizures, increased given clinical picture on 4/28.    Started on phenytoin 4/28  Level this am was 19.6 which corrects to about 46 based on albumin of 1.2  Goal of Therapy:  Phenytoin level 10-20 mcg/mL   Plan:  Hold phenytoin Lvl Check daily lvls until corrected level is < 20  Levester Fresh, PharmD, BCPS, BCCCP Clinical Pharmacist [phone]  Please check AMION  for all [place] numbers  02/17/2019 12:24 PM

## 2019-02-17 NOTE — Progress Notes (Signed)
eLink Physician-Brief Progress Note Patient Name: Molly [name] DOB: 27-Feb-1986 MRN: [id number]   Date of Service  02/17/2019  HPI/Events of Note  Hypokalemia, hypomag, hypophos  eICU Interventions  Potassium, Mag and Phos replaced     Intervention Category Intermediate Interventions: Electrolyte abnormality - evaluation and management  Molly [name] 02/17/2019, 5:35 AM

## 2019-02-18 LAB — CBC
HCT: 23.3 % — ABNORMAL LOW (ref 36.0–46.0)
Hemoglobin: 7 g/dL — ABNORMAL LOW (ref 12.0–15.0)
MCH: 28.3 pg (ref 26.0–34.0)
MCHC: 30 g/dL (ref 30.0–36.0)
MCV: 94.3 fL (ref 80.0–100.0)
Platelets: 95 10*3/uL — ABNORMAL LOW (ref 150–400)
RBC: 2.47 MIL/uL — ABNORMAL LOW (ref 3.87–5.11)
RDW: 17.6 % — ABNORMAL HIGH (ref 11.5–15.5)
WBC: 4 10*3/uL (ref 4.0–10.5)
nRBC: 0 % (ref 0.0–0.2)

## 2019-02-18 LAB — PHENYTOIN LEVEL, TOTAL: Phenytoin Lvl: 14.1 ug/mL (ref 10.0–20.0)

## 2019-02-18 LAB — MAGNESIUM: Magnesium: 1.9 mg/dL (ref 1.7–2.4)

## 2019-02-18 LAB — ALBUMIN: Albumin: 1.2 g/dL — ABNORMAL LOW (ref 3.5–5.0)

## 2019-02-18 LAB — GLUCOSE, CAPILLARY: Glucose-Capillary: 116 mg/dL — ABNORMAL HIGH (ref 70–99)

## 2019-02-18 LAB — PHOSPHORUS: Phosphorus: 2.6 mg/dL (ref 2.5–4.6)

## 2019-02-18 MED ORDER — HEPARIN SODIUM (PORCINE) 5000 UNIT/ML IJ SOLN
5000.0000 [IU] | Freq: Two times a day (BID) | INTRAMUSCULAR | Status: DC
  Administered 2019-02-18 – 2019-02-27 (×19): 5000 [IU] via SUBCUTANEOUS
  Filled 2019-02-18 (×18): qty 1

## 2019-02-18 MED ORDER — GUAIFENESIN 100 MG/5ML PO SOLN
5.0000 mL | ORAL | Status: DC | PRN
  Administered 2019-02-19 – 2019-02-21 (×3): 100 mg via ORAL
  Filled 2019-02-18 (×4): qty 10

## 2019-02-18 NOTE — Progress Notes (Signed)
Vent on standby patient on 30% ATC

## 2019-02-18 NOTE — Progress Notes (Signed)
NAME:  Molly [name], MRN:  [id number], DOB:  [birth year], LOS: 32 ADMISSION DATE:  01/20/2019, CONSULTATION DATE: 01/20/2019 REFERRING MD:  Bonner Puna, CHIEF COMPLAINT:  Sepsis  Brief History   34 y/o F with a history of IV drug abuse/polysubstance abuse (presented with urine drug screen positive for opiates and amphetamines) and a previous history in June or July 2019 of inadequately treated MRSA endocarditis having signed out AMA at that time.  She had a tricuspid vegetation at that time.  Admitted 4/4 hypotensive and in respiratory distress requiring intubation at [facility] long.   Admitted for acute respiratory failure/septic shock/ AKI due to septic emboli with MSSA bacteremia.  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 Awake / FC. Looks best in many days. Failed SBT 4/12 Failed SBT.  AKI improving, tx PRBC.  Less alert on precedex, methadone  4/13 More alert, FC. On precedex. Failed SBT. ID recommending PICC for antibiotcs > mother not sure about goals of care. S/p 1 unit PRBC   4/17 Trach placement 4/21 tolerating trach collar trials. 4/23 cdiff result positive overnight with continued diarrhea 4/26 seizure 4/28 status epilepticus  Consults:  Nephrology Neurology  Procedures:  ETT 4/4 >> 4/17 R Femoral line 4/4 >> 4/8 R IJ Tunneled CVC (IR) 4/21 >>  Trach 4/17 >>  Significant Diagnostic Tests:  Echocardiogram 4/4 >> TV vegetation measures 1.5 mm x 0.6 mm., severe TR CT Chest 4/4 >> bilateral  pulmonary nodules, peripheral and cavitated.  LE venous duplex 4/20 >> negative MRI brain 4/26 >> scattered T2/FLAIR most in parieto-occipital region b/l EEG 4/27 >> no evidence of seizures   Micro Data:  Blood cultures x2 4/4 >> MSSA  BCx2 4/8 >> negative   C diff 4/22 >> antigen positive, toxin negative, PCR positive  Antimicrobials:  Vancomycin Zosyn Teflaro >> stopped Ancef 4/4 >>  Diflucan 4/21 >> 4/24 Vancomycin po 4/23 >> 5/2  Interim history/subjective:  She has been on a trach collar for 3-1/2 was at the time of my examination.  She is beginning to complain of some dyspnea and has a substantial respiratory secretions which is not clearing very effectively.  Is intermittently agitated, denies difficulties with sleeping.  Objective   Blood pressure 104/80, pulse 90, temperature 99.3 F (37.4 C), resp. rate (!) 36, height 5\' 1"  (1.549 m), weight 54.9 kg, SpO2 100 %.    Vent Mode: PRVC FiO2 (%):  [30 %] 30 % Set Rate:  [15 bmp] 15 bmp Vt Set:  [450 mL] 450 mL PEEP:  [5 cmH20] 5 cmH20 Plateau Pressure:  [18 cmH20] 18 cmH20   Intake/Output Summary (Last 24 hours) at 02/18/2019 1139 Last data filed at 02/18/2019 1000 Gross per 24 hour  Intake 3054.85 ml  Output 1825 ml  Net 1229.85 ml   Filed Weights   02/16/19 0425 02/17/19 0500 02/18/19 0409  Weight: 57.1 kg 55.6 kg 54.9 kg   General: Female who is somewhat tachypnea but not overtly distressed at the time of my examination.   HEENT: Achy ostomy in place, purulent  tracheal secretions.   Neuro: Asked by nodding to questions, moves all 4 spontaneously  CV: S1 and S2 are regular without murmur rub or gallop, edema PULM: Tachypneic but not labored, lots of upper airway secretions with upper airway sounds symmetric air movement no wheezes  GI: Abdomen is soft without any organomegaly masses tenderness guarding or rebound  Extremities: warm/dry negative edema  Skin: no rashes or lesions     Resolved Hospital Problem  list   Septic Shock, thrush, AKI  Active Problem list    New onset seizure 4/26 Status epilepticus 4/28 Continue antiseizure medications per neurology  Most like within a week stop Dilantin continue Keppra  Appreciate neurology's input  Plan level this morning was 14 with an albumin of 1.2 suggesting we are approaching a toxic free Dilantin level.  Adjust dose if there is any indication of toxicity  Hypertension Continue Lopressor.  Labetalol Systolic blood pressure less than 140 per neurology.  MSSA bacteremia with tricuspid valve endocarditis Continue Ancef per infectious disease until 03/06/2019  C. difficile colitis Enteral vancomycin ended on 02/17/2019  Tracheostomy status Tolerated trach collar 8 hours on 02/16/2019 goal is 24/7 on trach collar.  Is a great deal of secretions today.  I have ordered guaifenesin to thin her secretions in the hope that she can clear them better, ordered a repeat sputum culture, and a morning chest film..   Severe protein calorie malnutrition Dysphagia Continue tube feedings Swallow evaluation noted   Hypokalemia Recent Labs  Lab 02/16/19 0346 02/17/19 0350 02/17/19 0745  K 3.1* 3.2* 4.1    Receiving K-Phos 02/17/2019 Continue to monitor electrolytes  Anemia of chronic illness Recent Labs    02/17/19 0350 02/18/19 0409  HGB 7.1* 7.0*    Transfuse per protocol  Best practice:  Diet: tube feeds DVT prophylaxis: SQ heparin GI prophylaxis: protonix Mobility: bed rest Code Status: Limited code Disposition: ICU  Labs:   CMP Latest Ref Rng & Units 02/17/2019 02/17/2019 02/16/2019  Glucose 70 - 99 mg/dL 130(H) 108(H) 123(H)  BUN 6 - 20 mg/dL 74(H) 76(H) 68(H)  Creatinine 0.44 - 1.00 mg/dL 1.94(H) 1.95(H) 2.05(H)  Sodium 135 - 145 mmol/L 146(H) 148(H) 145  Potassium 3.5 - 5.1 mmol/L 4.1 3.2(L) 3.1(L)  Chloride 98 - 111 mmol/L 117(H) 115(H) 113(H)  CO2 22 - 32 mmol/L 18(L) 18(L) 21(L)  Calcium 8.9 - 10.3 mg/dL 7.3(L) 7.6(L) 7.3(L)   Total Protein 6.5 - 8.1 g/dL 6.7 - -  Total Bilirubin 0.3 - 1.2 mg/dL 0.2(L) - -  Alkaline Phos 38 - 126 U/L 87 - -  AST 15 - 41 U/L 11(L) - -  ALT 0 - 44 U/L <5 - -   CBC Latest Ref Rng & Units 02/18/2019 02/17/2019 02/16/2019  WBC 4.0 - 10.5 K/uL 4.0 5.3 4.3  Hemoglobin 12.0 - 15.0 g/dL 7.0(L) 7.1(L) 7.2(L)  Hematocrit 36.0 - 46.0 % 23.3(L) 23.4(L) 24.0(L)  Platelets 150 - 400 K/uL 95(L) 109(L) 96(L)   ABG    Component Value Date/Time   PHART 7.439 02/13/2019 0941   PCO2ART 30.5 (L) 02/13/2019 0941   PO2ART 115.0 (H) 02/13/2019 0941   HCO3 20.7 02/13/2019 0941   TCO2 22 02/13/2019 0941   ACIDBASEDEF 3.0 (H) 02/13/2019 0941   O2SAT 99.0 02/13/2019 0941   CBG (last 3)   Lars Masson, MD Cell: [phone]   , 11:39 AM

## 2019-02-18 NOTE — Progress Notes (Signed)
MEDICATION RELATED CONSULT NOTE - INITIAL   Pharmacy Consult for Phenytoin Indication: Status epilepticus  Allergies  Allergen Reactions  . Lactose Intolerance (Gi)   . Vancomycin Other (See Comments)    Possible contributor to AKI and thrombocytopenia    Patient Measurements: Height: 5\' 1"  (154.9 cm) Weight: 121 lb 0.5 oz (54.9 kg) IBW/kg (Calculated) : 47.8  Vital Signs: Temp: 98.8 F (37.1 C) (05/03 0806) Temp Source: Bladder (05/03 0400) BP: 118/92 (05/03 0806) Pulse Rate: 101 (05/03 0806) Intake/Output from previous day: 05/02 0701 - 05/03 0700 In: 2371.6 [I.V.:10; NG/GT:1620; IV Piggyback:741.6] Out: 2110 [Urine:2110] Intake/Output from this shift: Total I/O In: 600 [P.O.:600] Out: -   Labs: Recent Labs    02/16/19 0346 02/17/19 0350 02/17/19 0745 02/18/19 0409  WBC 4.3 5.3  --  4.0  HGB 7.2* 7.1*  --  7.0*  HCT 24.0* 23.4*  --  23.3*  PLT 96* 109*  --  95*  CREATININE 2.05* 1.95* 1.94*  --   MG 2.0 1.8  --  1.9  PHOS 2.5 2.0*  --  2.6  ALBUMIN  --   --  1.2* 1.2*  PROT  --   --  6.7  --   AST  --   --  11*  --   ALT  --   --  <5  --   ALKPHOS  --   --  87  --   BILITOT  --   --  0.2*  --    Estimated Creatinine Clearance: 31.7 mL/min (A) (by C-G formula based on SCr of 1.94 mg/dL (H)).  Assessment: Pt is a 54 yoF who was having seizures this morning then developed status. She was previously on keppra for seizures, increased given clinical picture on 4/28.    Started on phenytoin 4/28  Level this am was 14.1 which corrects to about 33 based on albumin of 1.2  Goal of Therapy:  Phenytoin level 10-20 mcg/mL   Plan:  Continue to hold phenytoin Lvl Check daily lvls until corrected level is < 20  Levester Fresh, PharmD, BCPS, BCCCP Clinical Pharmacist [phone]  Please check AMION for all [place] numbers  02/18/2019 9:45 AM

## 2019-02-18 NOTE — Progress Notes (Signed)
Placed patient back on 30% ATC, placed vent on standby.

## 2019-02-18 NOTE — Progress Notes (Signed)
Placed patient back on vent due to increased WOB, RR increased to 40 BPM.

## 2019-02-18 NOTE — Progress Notes (Addendum)
Video call completed with mother, Jan. Patient waving and mouthing words to her mom.

## 2019-02-18 NOTE — Progress Notes (Signed)
Placed patient on 35% ATC, vent on standby.

## 2019-02-19 ENCOUNTER — Inpatient Hospital Stay (HOSPITAL_COMMUNITY): Payer: Medicaid Other

## 2019-02-19 DIAGNOSIS — A419 Sepsis, unspecified organism: Secondary | ICD-10-CM

## 2019-02-19 LAB — CBC WITH DIFFERENTIAL/PLATELET
Abs Immature Granulocytes: 0.02 10*3/uL (ref 0.00–0.07)
Basophils Absolute: 0 10*3/uL (ref 0.0–0.1)
Basophils Relative: 0 %
Eosinophils Absolute: 0 10*3/uL (ref 0.0–0.5)
Eosinophils Relative: 0 %
HCT: 21.4 % — ABNORMAL LOW (ref 36.0–46.0)
Hemoglobin: 6.4 g/dL — CL (ref 12.0–15.0)
Immature Granulocytes: 1 %
Lymphocytes Relative: 27 %
Lymphs Abs: 0.9 10*3/uL (ref 0.7–4.0)
MCH: 28.2 pg (ref 26.0–34.0)
MCHC: 29.9 g/dL — ABNORMAL LOW (ref 30.0–36.0)
MCV: 94.3 fL (ref 80.0–100.0)
Monocytes Absolute: 0.2 10*3/uL (ref 0.1–1.0)
Monocytes Relative: 6 %
Neutro Abs: 2.3 10*3/uL (ref 1.7–7.7)
Neutrophils Relative %: 66 %
Platelets: 86 10*3/uL — ABNORMAL LOW (ref 150–400)
RBC: 2.27 MIL/uL — ABNORMAL LOW (ref 3.87–5.11)
RDW: 17.7 % — ABNORMAL HIGH (ref 11.5–15.5)
WBC: 3.4 10*3/uL — ABNORMAL LOW (ref 4.0–10.5)
nRBC: 0 % (ref 0.0–0.2)

## 2019-02-19 LAB — CBC
HCT: 26.8 % — ABNORMAL LOW (ref 36.0–46.0)
Hemoglobin: 8.1 g/dL — ABNORMAL LOW (ref 12.0–15.0)
MCH: 28.2 pg (ref 26.0–34.0)
MCHC: 30.2 g/dL (ref 30.0–36.0)
MCV: 93.4 fL (ref 80.0–100.0)
Platelets: 101 10*3/uL — ABNORMAL LOW (ref 150–400)
RBC: 2.87 MIL/uL — ABNORMAL LOW (ref 3.87–5.11)
RDW: 16.9 % — ABNORMAL HIGH (ref 11.5–15.5)
WBC: 5.4 10*3/uL (ref 4.0–10.5)
nRBC: 0 % (ref 0.0–0.2)

## 2019-02-19 LAB — GLUCOSE, CAPILLARY
Glucose-Capillary: 102 mg/dL — ABNORMAL HIGH (ref 70–99)
Glucose-Capillary: 108 mg/dL — ABNORMAL HIGH (ref 70–99)
Glucose-Capillary: 95 mg/dL (ref 70–99)

## 2019-02-19 LAB — BASIC METABOLIC PANEL
Anion gap: 14 (ref 5–15)
BUN: 74 mg/dL — ABNORMAL HIGH (ref 6–20)
CO2: 15 mmol/L — ABNORMAL LOW (ref 22–32)
Calcium: 7.4 mg/dL — ABNORMAL LOW (ref 8.9–10.3)
Chloride: 115 mmol/L — ABNORMAL HIGH (ref 98–111)
Creatinine, Ser: 1.67 mg/dL — ABNORMAL HIGH (ref 0.44–1.00)
GFR calc Af Amer: 47 mL/min — ABNORMAL LOW (ref >60)
GFR calc non Af Amer: 40 mL/min — ABNORMAL LOW (ref >60)
Glucose, Bld: 120 mg/dL — ABNORMAL HIGH (ref 70–99)
Potassium: 3.4 mmol/L — ABNORMAL LOW (ref 3.5–5.1)
Sodium: 144 mmol/L (ref 135–145)

## 2019-02-19 LAB — PREPARE RBC (CROSSMATCH)

## 2019-02-19 MED ORDER — PRO-STAT SUGAR FREE PO LIQD
30.0000 mL | Freq: Every day | ORAL | Status: DC
  Administered 2019-02-19 – 2019-02-27 (×9): 30 mL via ORAL
  Filled 2019-02-19 (×10): qty 30

## 2019-02-19 MED ORDER — SODIUM CHLORIDE 0.9 % IV SOLN
INTRAVENOUS | Status: DC | PRN
  Administered 2019-02-21 – 2019-02-22 (×2): 250 mL via INTRAVENOUS
  Administered 2019-02-23 – 2019-02-25 (×2): 500 mL via INTRAVENOUS

## 2019-02-19 MED ORDER — JEVITY 1.2 CAL PO LIQD
1000.0000 mL | ORAL | Status: DC
  Administered 2019-02-19 – 2019-02-28 (×10): 1000 mL
  Filled 2019-02-19 (×26): qty 1000

## 2019-02-19 MED ORDER — ADULT MULTIVITAMIN LIQUID CH
15.0000 mL | Freq: Every day | ORAL | Status: DC
  Administered 2019-02-19 – 2019-02-28 (×10): 15 mL via ORAL
  Filled 2019-02-19 (×11): qty 15

## 2019-02-19 MED ORDER — FREE WATER
100.0000 mL | Status: DC
  Administered 2019-02-19 – 2019-02-28 (×49): 100 mL

## 2019-02-19 MED ORDER — SODIUM CHLORIDE 0.9% IV SOLUTION
Freq: Once | INTRAVENOUS | Status: AC
  Administered 2019-02-19: 13:00:00 via INTRAVENOUS

## 2019-02-19 NOTE — Progress Notes (Signed)
  Speech Language Pathology Treatment: Dysphagia  Patient Details Name: Molly [name] MRN: [id number] DOB: [birth year] Today's Date: 02/19/2019 Time: 1205-1225 SLP Time Calculation (min) (ACUTE ONLY): 20 min  Assessment / Plan / Recommendation Clinical Impression  Pt alert, clicking for attention, mouthing words and requesting something to drink.  She consumed three containers of nectar-thick liquid with intermittent verbal cues needed to pace herself and allow intervals to take a breath.  RR ranged 25-44. Pt has been on TC four hours; on and off the vent over the weekend per chart review.   Continues with substantial secretions.  SLP will continue to follow; pt will need downsize to #4 with cuff in order to tolerate PMV.    HPI HPI: Molly [name] 33 year old female who presented to the emergency department with altered mental status.  Patient was admitted to the hospital with working diagnosis of sepsis with multiorgan failure and possible DIC.  Patient condition deteriorated and required invasive mechanical ventilation 4/4.  CT of the chest was done which was positive for septic emboli and right base pneumonia.  Blood cultures grew MSSA.  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  Patient has history of polysubstance/IV drug abuse.  Patient has been on antibiotic therapy with cefazolin.  Underwent tracheostomy on 2/03, which was complicated by bleeding.      SLP Plan  Continue with current plan of care       Recommendations  Diet recommendations: Nectar-thick liquids Liquids provided via: Cup;Straw Medication Administration: Whole meds with liquid Supervision: Patient able to self feed Compensations: Minimize environmental distractions Postural Changes and/or Swallow Maneuvers: Seated upright 90 degrees;Upright 30-60 min after meal                Oral Care Recommendations: Oral care BID Follow up Recommendations: Inpatient Rehab SLP Visit Diagnosis:  Dysphagia, oropharyngeal phase (R13.12) Plan: Continue with current plan of care       GO                Molly [name] 02/19/2019, 12:27 PM  [name] L. Tivis Ringer, [place] Office number [phone] Pager [phone]

## 2019-02-19 NOTE — Progress Notes (Signed)
Physical Therapy Treatment Patient Details Name: Molly [name] MRN: [id number] DOB: Jul 26, 1986 Today's Date: 02/19/2019    History of Present Illness Pt adm for acute respiratory failure due to Septic pulmonary embolus due to MRSA tricuspid vegetation. Pt intubated 01/20/19. Pt with multiple seizures 4/28-4/29. 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 Slow progress.    Follow Up Recommendations  CIR     Equipment Recommendations  Other (comment)(To be determined)    Recommendations for Other Services       Precautions / Restrictions Precautions Precautions: Fall;Other (comment) Precaution Comments: vent Restrictions Weight Bearing Restrictions: No    Mobility  Bed Mobility Overal bed mobility: Needs Assistance Bed Mobility: Supine to Sit;Sit to Supine     Supine to sit: +2 for physical assistance;Max assist Sit to supine: +2 for physical assistance;Max assist   General bed mobility comments: Assist to bring legs off of bed, elevate trunk into sitting and bring hips to EOB. Assist to lower trunk and bring legs back up in bed  Transfers                    Ambulation/Gait                 Stairs             Wheelchair Mobility    Modified Rankin (Stroke Patients Only)       Balance Overall balance assessment: Needs assistance Sitting-balance support: Bilateral upper extremity supported;Feet unsupported Sitting balance-Leahy Scale: Poor Sitting balance - Comments: Sat EOB 6-7 minutes with min assist for balance Postural control: Posterior lean                                  Cognition Arousal/Alertness: Awake/alert Behavior During Therapy: WFL for tasks assessed/performed Overall Cognitive Status: Difficult to assess Area of Impairment: Following commands;Problem solving                       Following Commands: Follows one step commands consistently;Follows one step commands with increased time     Problem Solving: Slow processing;Decreased initiation;Requires verbal cues;Requires tactile cues General Comments: Incr time required for instructions      Exercises      General Comments        Pertinent Vitals/Pain Pain Assessment: Faces Faces Pain Scale: Hurts even more Pain Location: Back Pain Descriptors / Indicators: Grimacing Pain Intervention(s): Limited activity within patient's tolerance;Monitored during session;Repositioned    Home Living Family/patient expects to be discharged to:: Private residence Living Arrangements: Parent                  Prior Function Level of Independence: Independent          PT Goals (current goals can now be found in the care plan section) Acute Rehab PT Goals Patient Stated Goal: Pt unable Progress towards PT goals: Progressing toward goals    Frequency    Min 3X/week      PT Plan Current plan remains appropriate    Co-evaluation              AM-PAC PT "6 Clicks" Mobility   Outcome Measure  Help needed turning from your back to your side while in a flat bed without using bedrails?:  Total Help needed moving from lying on your back to sitting on the side of a flat bed without using bedrails?: Total Help needed moving to and from a bed to a chair (including a wheelchair)?: Total Help needed standing up from a chair using your arms (e.g., wheelchair or bedside chair)?: Total Help needed to walk in hospital room?: Total Help needed climbing 3-5 steps with a railing? : Total 6 Click Score: 6    End of Session Equipment Utilized During Treatment: Oxygen(trach collar) Activity Tolerance: Patient limited by fatigue Patient left: in bed;with bed alarm set;with call bell/phone within reach Nurse Communication: Mobility status PT Visit Diagnosis: Other abnormalities of gait and  mobility (R26.89);Muscle weakness (generalized) (M62.81);Unsteadiness on feet (R26.81)     Time: 8295-6213 PT Time Calculation (min) (ACUTE ONLY): 15 min  Charges:  $Therapeutic Activity: 8-22 mins                     [place] Pager [phone] Office [place] 02/19/2019, 12:17 PM

## 2019-02-19 NOTE — Progress Notes (Signed)
Patient received 1 unit of PRBC. No issues during administration.

## 2019-02-19 NOTE — Progress Notes (Signed)
Foley due for exchange. Before inserting new one, attempted pure wick. Has been voiding. Did bladder scan to check for retention. Has only 80 mL. Patient denies any discomfort or urge to void. Voiding clear, pink urine. Will continue to monitor.

## 2019-02-19 NOTE — Progress Notes (Signed)
Video call with mom completed. Patient communicating well and smiling, kissing the camera.

## 2019-02-19 NOTE — Progress Notes (Signed)
Nutrition Follow-up  RD working remotely.   DOCUMENTATION CODES:   Not applicable  INTERVENTION:  - will adjust TF regimen: 30 ml prostat once/day with Jevity 1.2 @ 30 ml/hr to advance by 10 ml every 4 hours to reach goal rate of 60 ml/hr. - continue juven BID. - will increase free water to 100 ml every 4 hours. - at goal rate, this regimen will provide 1988 kcal, 95 grams protein, 28 grams amino acids, and 1762 ml free water. - slower advancement d/t fiber content of Jevity for patient who has been on a low fiber or fiber-free formula for several weeks.   NUTRITION DIAGNOSIS:   Inadequate oral intake related to acute illness, dysphagia, other (see comment)(current diet order) as evidenced by other (comment)(CLD, nectar-thick). -revised  GOAL:   Patient will meet greater than or equal to 90% of their needs -met with TF regimen  MONITOR:   TF tolerance, PO intake, Supplement acceptance, Diet advancement, Labs, Weight trends, Skin  ASSESSMENT:    33 y/o female with h/o IVDU/polysubstance abuse and MRSA bacteremia/TV endocarditis/spinal osteomyelitis/septic pulmonary emboli (left AMA July 2019) who presented to the ED with AMS found to be septic with hypothermia, hypotension, and hypoxia.   Significant Events: 4/4    Admit, intubated 4/17 Trach placed, Cortrak placed at LOT 4/21 Cortrak tube tip pulled back to gastric placement 4/23 MBS with diet advancement to Regular, Nectar; C.diff + 4/26 Seizures 4/28 Status epilepticus, back on vent  5/1   Diet advanced from NPO to FLD, nectar thick 5/3   Diet changed to CLD, nectar thick d/t lactose intolerance   Patient is now on trach collar 24/7, per notes. SLP last saw patient earlier today and plan is to continue current diet (CLD, nectar thick). Patient has Cortrak and is receiving Vital AF 1.2 @ 60 ml/hr with juven BID and 100 ml free water TID. This regimen provides 1888 kcal, 108 grams protein, 28 grams amino acids, and 1468 ml  free water. Estimated nutrition needs updated now that patient on trach collar and completely off the vent for >24 hours.      Medications reviewed; 15 ml liquid multivitamin per tube/day, 250 mg ascorbic acid per tube BID.  Labs reviewed; CBG: 95 mg/dl, K: 3.4 mmol/l, Cl: 115 mmol/l, BUN: 74 mg/dl, creatinine: 1.67 mg/dl, Ca: 7.4 mg/dl, GFR: 40 ml/min.   Diet Order:   Diet Order            Diet clear liquid Room service appropriate? Yes with Assist; Fluid consistency: Nectar Thick  Diet effective now              EDUCATION NEEDS:   Not appropriate for education at this time  Skin:  Skin Assessment: Skin Integrity Issues: Skin Integrity Issues:: DTI DTI: sacrum (new 5/1) Stage II: mid back (new 4/28) Other: MASD: sacrum  Last BM:  5/4  Height:   Ht Readings from Last 1 Encounters:  01/20/19 '5\' 1"'  (1.549 m)    Weight:   Wt Readings from Last 1 Encounters:  02/19/19 57.3 kg    Ideal Body Weight:  47.7 kg  BMI:  Body mass index is 23.87 kg/m.  Estimated Nutritional Needs:   Kcal:  1795-1960 kcal  Protein:  90-100 grams  Fluid:  >/= 1.7 L     Molly Matin, MS, RD, LDN, [facility] Inpatient Clinical Dietitian Pager # [phone] After hours/weekend pager # [phone]

## 2019-02-19 NOTE — Progress Notes (Signed)
NAME:  Molly [name], MRN:  [id number], DOB:  [birth year], LOS: 71 ADMISSION DATE:  01/20/2019, CONSULTATION DATE: 01/20/2019 REFERRING MD:  Bonner Puna, CHIEF COMPLAINT:  Sepsis  Brief History   33 y/o F with a history of IV drug abuse/polysubstance abuse (presented with urine drug screen positive for opiates and amphetamines) and a previous history in June or July 2019 of inadequately treated MRSA endocarditis having signed out AMA at that time.  She had a tricuspid vegetation at that time.  Admitted 4/4 hypotensive and in respiratory distress requiring intubation at [facility] long.   Admitted for acute respiratory failure/septic shock/ AKI due to septic emboli with MSSA bacteremia.  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 Awake / FC. Looks best in many days. Failed SBT 4/12 Failed SBT.  AKI improving, tx PRBC.  Less alert on precedex, methadone  4/13 More alert, FC. On precedex. Failed SBT. ID recommending PICC for antibiotcs > mother not sure about goals of care. S/p 1 unit PRBC   4/17 Trach placement 4/21 tolerating trach collar trials. 4/23 cdiff result positive overnight with continued diarrhea 4/26 seizure 4/28 status epilepticus  Consults:  Nephrology Neurology  Procedures:  ETT 4/4 >> 4/17 R Femoral line 4/4 >> 4/8 R IJ Tunneled CVC (IR) 4/21 >>  Trach 4/17 >>  Significant Diagnostic Tests:  Echocardiogram 4/4 >> TV vegetation measures 1.5 mm x 0.6 mm., severe TR CT Chest 4/4 >> bilateral  pulmonary nodules, peripheral and cavitated.  LE venous duplex 4/20 >> negative MRI brain 4/26 >> scattered T2/FLAIR most in parieto-occipital region b/l EEG 4/27 >> no evidence of seizures   Micro Data:  Blood cultures x2 4/4 >> MSSA  BCx2 4/8 >> negative   C diff 4/22 >> antigen positive, toxin negative, PCR positive  Antimicrobials:  Vancomycin Zosyn Teflaro >> stopped Ancef 4/4 >>  Diflucan 4/21 >> 4/24 Vancomycin po 4/23 >> 5/2  Interim history/subjective:  He has been on a trach collar since 08 30 and is not having difficulties with dyspnea.  She continues to have substantial amounts of tracheal secretions.  Hemoglobin has dropped overnight, stool is brown.    Objective   Blood pressure 106/89, pulse 100, temperature 98.6 F (37 C), resp. rate (!) 35, height 5\' 1"  (1.549 m), weight 57.3 kg, SpO2 100 %.    Vent Mode: PRVC FiO2 (%):  [30 %-35 %] 35 % Set Rate:  [15 bmp] 15 bmp Vt Set:  [450 mL] 450 mL PEEP:  [5 cmH20] 5 cmH20 Plateau Pressure:  [15 cmH20] 15 cmH20   Intake/Output Summary (Last 24 hours) at 02/19/2019 1108 Last data filed at 02/19/2019 1000 Gross per 24 hour  Intake 3610.51 ml  Output 3305 ml  Net 305.51 ml   Filed Weights   02/17/19 0500 02/18/19 0409 02/19/19 0500  Weight: 55.6 kg 54.9 kg 57.3 kg   General: Frail and cachectic appearing somewhat  intermittently agitated    HEENT: Tracheostomy in place, purulent tracheal secretions.   Neuro: Nods to questions and moves all 4 spontaneously  CV: 1 and S2 are regular without murmur rub or gallop, there is no JVD, there is no edema  PULM: Unlabored while breathing with a trach collar.  Symmetric air movement lots of scattered rhonchi no wheezes  GI: Diminished flat and soft without any organomegaly masses tenderness guarding or rebound   Extremities: warm/dry negative edema, no splinters on the fingers Skin: no rashes or lesions     Resolved Hospital Problem list   Septic Shock, thrush, AKI   Active Problem list    New onset seizure 4/26 Status epilepticus 4/28 Continue Keppra   Appreciate neurology's input    Hypertension Continue Lopressor.  Labetalol Systolic blood pressure less than 140 per neurology.  MSSA bacteremia with tricuspid valve endocarditis Continue Ancef per infectious disease until 03/06/2019  C. difficile colitis Enteral vancomycin ended on 02/17/2019  Tracheostomy status Tolerated trach collar 8 hours on 02/16/2019 goal is 24/7 on trach collar.  Is a great deal of secretions today.  I have ordered guaifenesin to thin her secretions, and a repeat sputum culture to rule out secondary infection. .   Severe protein calorie malnutrition Dysphagia Continue tube feedings Swallow evaluation noted   Hypokalemia Recent Labs  Lab 02/17/19 0350 02/17/19 0745 02/19/19 0514  K 3.2* 4.1 3.4*    Receiving K-Phos 02/17/2019 Continue to monitor electrolytes  Anemia of chronic illness Recent Labs    02/18/19 0409 02/19/19 0514  HGB 7.0* 6.4*   Globin has dropped  this morning, no obvious external source transfusing 1 unit of packed red blood cells  Best practice:  Diet: tube feeds DVT prophylaxis: SQ heparin GI prophylaxis: protonix Mobility: bed rest Code Status: Limited code Disposition: ICU  Labs:   CMP Latest Ref Rng & Units 02/19/2019 02/17/2019 02/17/2019  Glucose 70 - 99 mg/dL 120(H) 130(H) 108(H)  BUN 6 - 20 mg/dL 74(H) 74(H) 76(H)  Creatinine 0.44 - 1.00 mg/dL 1.67(H) 1.94(H) 1.95(H)  Sodium 135 - 145 mmol/L 144 146(H) 148(H)  Potassium 3.5 - 5.1 mmol/L 3.4(L) 4.1 3.2(L)  Chloride 98 - 111 mmol/L 115(H) 117(H) 115(H)  CO2 22 - 32 mmol/L 15(L) 18(L) 18(L)  Calcium 8.9 - 10.3 mg/dL 7.4(L) 7.3(L) 7.6(L)  Total Protein 6.5 - 8.1 g/dL - 6.7 -  Total Bilirubin 0.3 - 1.2 mg/dL - 0.2(L) -  Alkaline Phos 38 - 126 U/L - 87 -  AST 15 - 41 U/L - 11(L) -  ALT 0 - 44 U/L - <5 -   CBC Latest Ref Rng & Units 02/19/2019 02/18/2019 02/17/2019  WBC 4.0 - 10.5  K/uL 3.4(L) 4.0 5.3  Hemoglobin 12.0 - 15.0 g/dL 6.4(LL) 7.0(L) 7.1(L)  Hematocrit 36.0 - 46.0 % 21.4(L) 23.3(L) 23.4(L)  Platelets 150 - 400 K/uL 86(L) 95(L) 109(L)   ABG    Component Value Date/Time   PHART 7.439 02/13/2019 0941   PCO2ART 30.5 (L) 02/13/2019 0941   PO2ART 115.0 (H) 02/13/2019 0941   HCO3 20.7 02/13/2019 0941   TCO2 22 02/13/2019 0941   ACIDBASEDEF 3.0 (H) 02/13/2019 0941   O2SAT 99.0 02/13/2019 0941   CBG (last 3)   Lars Masson, MD Cell: [phone]   , 11:08 AM

## 2019-02-19 NOTE — Progress Notes (Signed)
CRITICAL VALUE ALERT  Critical Value:  Hemoglobin 6.4  Date & Time Notied:  02/19/19 6:06 AM   Provider Notified: E link MD  Orders Received/Actions taken: Awaiting orders

## 2019-02-20 ENCOUNTER — Inpatient Hospital Stay: Payer: Self-pay

## 2019-02-20 DIAGNOSIS — D62 Acute posthemorrhagic anemia: Secondary | ICD-10-CM

## 2019-02-20 LAB — BPAM RBC
Blood Product Expiration Date: 202005102359
ISSUE DATE / TIME: 202005041244
Unit Type and Rh: 9500

## 2019-02-20 LAB — CULTURE, RESPIRATORY W GRAM STAIN: Special Requests: NORMAL

## 2019-02-20 LAB — BASIC METABOLIC PANEL
Anion gap: 14 (ref 5–15)
BUN: 70 mg/dL — ABNORMAL HIGH (ref 6–20)
CO2: 14 mmol/L — ABNORMAL LOW (ref 22–32)
Calcium: 7.6 mg/dL — ABNORMAL LOW (ref 8.9–10.3)
Chloride: 114 mmol/L — ABNORMAL HIGH (ref 98–111)
Creatinine, Ser: 1.58 mg/dL — ABNORMAL HIGH (ref 0.44–1.00)
GFR calc Af Amer: 50 mL/min — ABNORMAL LOW (ref >60)
GFR calc non Af Amer: 43 mL/min — ABNORMAL LOW (ref >60)
Glucose, Bld: 110 mg/dL — ABNORMAL HIGH (ref 70–99)
Potassium: 3.4 mmol/L — ABNORMAL LOW (ref 3.5–5.1)
Sodium: 142 mmol/L (ref 135–145)

## 2019-02-20 LAB — CBC WITH DIFFERENTIAL/PLATELET
Abs Immature Granulocytes: 0.02 10*3/uL (ref 0.00–0.07)
Basophils Absolute: 0 10*3/uL (ref 0.0–0.1)
Basophils Relative: 1 %
Eosinophils Absolute: 0 10*3/uL (ref 0.0–0.5)
Eosinophils Relative: 0 %
HCT: 24.8 % — ABNORMAL LOW (ref 36.0–46.0)
Hemoglobin: 7.7 g/dL — ABNORMAL LOW (ref 12.0–15.0)
Immature Granulocytes: 1 %
Lymphocytes Relative: 19 %
Lymphs Abs: 0.7 10*3/uL (ref 0.7–4.0)
MCH: 28.4 pg (ref 26.0–34.0)
MCHC: 31 g/dL (ref 30.0–36.0)
MCV: 91.5 fL (ref 80.0–100.0)
Monocytes Absolute: 0.3 10*3/uL (ref 0.1–1.0)
Monocytes Relative: 8 %
Neutro Abs: 2.8 10*3/uL (ref 1.7–7.7)
Neutrophils Relative %: 71 %
Platelets: 101 10*3/uL — ABNORMAL LOW (ref 150–400)
RBC: 2.71 MIL/uL — ABNORMAL LOW (ref 3.87–5.11)
RDW: 17 % — ABNORMAL HIGH (ref 11.5–15.5)
WBC: 3.8 10*3/uL — ABNORMAL LOW (ref 4.0–10.5)
nRBC: 0 % (ref 0.0–0.2)

## 2019-02-20 LAB — TYPE AND SCREEN
ABO/RH(D): O NEG
Antibody Screen: NEGATIVE
Unit division: 0

## 2019-02-20 LAB — GLUCOSE, CAPILLARY
Glucose-Capillary: 113 mg/dL — ABNORMAL HIGH (ref 70–99)
Glucose-Capillary: 89 mg/dL (ref 70–99)
Glucose-Capillary: 98 mg/dL (ref 70–99)

## 2019-02-20 LAB — PHENYTOIN LEVEL, TOTAL: Phenytoin Lvl: 3.2 ug/mL — ABNORMAL LOW (ref 10.0–20.0)

## 2019-02-20 MED ORDER — PHENYTOIN 125 MG/5ML PO SUSP
100.0000 mg | Freq: Three times a day (TID) | ORAL | Status: DC
  Administered 2019-02-20 – 2019-02-21 (×4): 100 mg
  Filled 2019-02-20 (×6): qty 4

## 2019-02-20 MED ORDER — SODIUM CHLORIDE 0.9 % IV SOLN
2.0000 g | Freq: Two times a day (BID) | INTRAVENOUS | Status: DC
  Administered 2019-02-20 – 2019-02-21 (×3): 2 g via INTRAVENOUS
  Filled 2019-02-20 (×4): qty 2

## 2019-02-20 NOTE — Progress Notes (Addendum)
NAME:  Molly [name], MRN:  [id number], DOB:  Jul 14, 1986, LOS: 94 ADMISSION DATE:  01/20/2019, CONSULTATION DATE: 01/20/2019 REFERRING MD:  Bonner Puna, CHIEF COMPLAINT:  Sepsis  Brief History   33 y/o F with a history of IV drug abuse/polysubstance abuse (presented with urine drug screen positive for opiates and amphetamines) and a previous history in June or July 2019 of inadequately treated MRSA endocarditis having signed out AMA at that time.  She had a tricuspid vegetation at that time.  Admitted 4/4 hypotensive and in respiratory distress requiring intubation at [facility] long.   Admitted for acute respiratory failure/septic shock/ AKI due to septic emboli with MSSA bacteremia.  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 Awake / FC. Looks best in many days. Failed SBT 4/12 Failed SBT.  AKI improving, tx PRBC.  Less alert on precedex, methadone  4/13 More alert, FC. On precedex. Failed SBT. ID recommending PICC for antibiotcs > mother not sure about goals of care. S/p 1 unit PRBC   4/17 Trach placement 4/21 tolerating trach collar trials. 4/23 cdiff result positive overnight with continued diarrhea 4/26 seizure 4/28 status epilepticus  Consults:  Nephrology Neurology  Procedures:  ETT 4/4 >> 4/17 R Femoral line 4/4 >> 4/8 R IJ Tunneled CVC (IR) 4/21 >>  Trach 4/17 >>  Significant Diagnostic Tests:  Echocardiogram 4/4 >> TV vegetation measures 1.5 mm x 0.6 mm., severe TR CT Chest 4/4 >> bilateral  pulmonary nodules, peripheral and cavitated.  LE venous duplex 4/20 >> negative MRI brain 4/26 >> scattered T2/FLAIR most in parieto-occipital region b/l EEG 4/27 >> no evidence of seizures   Micro Data:  Blood cultures x2 4/4 >> MSSA  BCx2 4/8 >> negative   C diff 4/22 >> antigen positive, toxin negative, PCR positive 4/28 urine cx: neg 5/4 trach cx: gpc  Antimicrobials:  Vancomycin Zosyn Teflaro >> stopped Ancef 4/4 >>  Diflucan 4/21 >> 4/24 Vancomycin po 4/23 >> 5/5   Interim history/subjective:  5/5: transfused 5/4 wihtout issue but still decreased hgb to 7.7 without overt bleeding seen. Phenytoin level low this am at 3.2, will resume dosing. Change cvc to picc today.   Objective   Blood pressure 114/88, pulse (!) 109, temperature 98.5 F (36.9 C), temperature source Oral, resp. rate (!) 23, height 5\' 1"  (1.549 m), weight 57.5 kg, SpO2 100 %.    FiO2 (%):  [30 %-35 %] 30 % Set Rate:  [15 bmp] 15 bmp Vt Set:  [450 mL] 450 mL PEEP:  [5 cmH20] 5 cmH20 Plateau Pressure:  [21 cmH20-22 cmH20] 22 cmH20   Intake/Output Summary (Last 24 hours) at 02/20/2019 0813 Last data filed at 02/20/2019 0700 Gross per 24 hour  Intake 2646.2 ml  Output 2250 ml  Net 396.2 ml   Filed Weights   02/18/19 0409 02/19/19 0500 02/20/19 0500  Weight: 54.9 kg 57.3 kg 57.5 kg   General: Frail and cachectic appearing somnolent this am HEENT: Tracheostomy in place, purulent tracheal secretions. Connected to vent  Neuro: Nods to questions and moves all  4 spontaneously   CV: 1 and S2 are regular without murmur rub or gallop, there is no JVD, there is no edema  PULM: Unlabored while breathing with vent  Symmetric air movement lots of scattered rhonchi no wheezes  GI: Diminished flat and soft without any organomegaly masses tenderness guarding or rebound , multiple sites of bruising Extremities: warm/dry negative edema, no splinters on the fingers Skin: no rashes or lesions   Resolved Hospital Problem  list   Septic Shock, thrush  Active Problem list    New onset seizure 4/26 Status epilepticus 4/28 Continue Keppra and phenytoin Appreciate neurology's input: will need guidance as last note reflects should stop phenytoin Anxiety:  Prn clonazepam melatonin   Hypertension Continue Lopressor.  Labetalol prn Systolic blood pressure less than 140 per neurology.   MSSA bacteremia with tricuspid valve endocarditis Continue Ancef per infectious disease until 03/06/2019 C. difficile colitis Enteral vancomycin ended on 02/20/19   Acute resp failure with hypoxia Septic pulmonary emboi Tolerated trach collar ~8 hours on 02/19/2019 goal is 24/7 on trach collar.  Cont with large amounts of secretions 5/4 cx with gpc On vent at time of my exam.    Severe protein calorie malnutrition Dysphagia Continue tube feedings Swallow evaluation noted   Hypokalemia Cont replacement Continue to monitor electrolytes   Anemia of chronic illness hgb has dropped  this morning s/p 1U PRBC 5/4 Transfuse <7 no obvious bleeding noted  Thrombocytopenia leukopenia  AKI:  Cr improving with stable uop Follow     Best practice:  Diet: tube feeds and PO nectar thick liquids DVT prophylaxis: SQ heparin GI prophylaxis: protonix Mobility: oob with assistance Code Status: Limited code Disposition: ICU  Labs:   CMP Latest Ref Rng & Units 02/20/2019 02/19/2019 02/17/2019  Glucose 70 - 99 mg/dL 110(H) 120(H) 130(H)  BUN 6 - 20 mg/dL 70(H) 74(H) 74(H)  Creatinine 0.44 - 1.00 mg/dL 1.58(H) 1.67(H) 1.94(H)  Sodium 135 - 145 mmol/L 142 144 146(H)  Potassium 3.5 - 5.1 mmol/L 3.4(L) 3.4(L) 4.1  Chloride 98 - 111 mmol/L 114(H) 115(H) 117(H)  CO2 22 - 32 mmol/L 14(L) 15(L) 18(L)  Calcium 8.9 - 10.3 mg/dL 7.6(L) 7.4(L) 7.3(L)  Total Protein 6.5 - 8.1 g/dL - - 6.7  Total Bilirubin 0.3 - 1.2 mg/dL - - 0.2(L)  Alkaline Phos 38 - 126 U/L - - 87  AST 15 - 41 U/L - - 11(L)  ALT 0 - 44 U/L - - <5   CBC  Latest Ref Rng & Units 02/20/2019 02/19/2019 02/19/2019  WBC 4.0 - 10.5 K/uL 3.8(L) 5.4 3.4(L)  Hemoglobin 12.0 - 15.0 g/dL 7.7(L) 8.1(L) 6.4(LL)  Hematocrit 36.0 - 46.0 % 24.8(L) 26.8(L) 21.4(L)  Platelets 150 - 400 K/uL 101(L) 101(L) 86(L)   ABG    Component Value Date/Time   PHART 7.439 02/13/2019 0941   PCO2ART 30.5 (L) 02/13/2019 0941   PO2ART 115.0 (H) 02/13/2019 0941   HCO3 20.7 02/13/2019 0941   TCO2 22 02/13/2019 0941   ACIDBASEDEF 3.0 (H) 02/13/2019 0941   O2SAT 99.0 02/13/2019 0941   CBG (last 3)   Critical care time: The patient is critically ill with multiple organ systems failure and requires high complexity decision making for assessment and support, frequent evaluation and titration of therapies, application of advanced monitoring technologies and extensive interpretation of multiple databases.  Critical care time 37 mins. This represents my time independent of the NPs time taking care of the pt. This is excluding procedures.    Janett Billow  [facility] DO Pager: [phone] After hours pager: [phone]  [facility] 02/20/2019, 8:13 AM

## 2019-02-20 NOTE — Progress Notes (Addendum)
Pharmacy Antibiotic Note  Molly [name] is a 33 y.o. female admitted on 01/20/2019 with bacteremia/endocarditis and now has e.coli growing in trach aspirate culture.    Patient on cefazolin for MSSA endocarditis through 5/19 (6 weeks), pharmacy consulted for cefepime dosing to cover secondary e.coli pneumonia infection. WBC 3.8, CrCL ~42, sCr 1.58, afebrile  Plan: Cefepime IV 2g Q12H x7 days Then resume cefazolin through 5/19  Monitor Cxs, clinical status, renal function  Height: 5\' 1"  (154.9 cm) Weight: 126 lb 12.2 oz (57.5 kg) IBW/kg (Calculated) : 47.8  Temp (24hrs), Avg:99.1 F (37.3 C), Min:98.5 F (36.9 C), Max:99.8 F (37.7 C)  Recent Labs  Lab 02/16/19 0346 02/17/19 0350 02/17/19 0745 02/18/19 0409 02/19/19 0514 02/19/19 1700 02/20/19 0511  WBC 4.3 5.3  --  4.0 3.4* 5.4 3.8*  CREATININE 2.05* 1.95* 1.94*  --  1.67*  --  1.58*    Estimated Creatinine Clearance: 42.1 mL/min (A) (by C-G formula based on SCr of 1.58 mg/dL (H)).    Allergies  Allergen Reactions  . Lactose Intolerance (Gi)   . Vancomycin Other (See Comments)    Possible contributor to AKI and thrombocytopenia    Antimicrobials this admission: 5/5 cefepime >> (5/12) 4/23 Vanc po>> 5/5 4/21 fluconazole (thrush) >> 4/23 4/4 cefazolin >>5/5;  (5/12) >>(5/19) 4/4 zosyn x1 4/4 vancomycin  x1 4/4 ceftaroline x1  Microbiology results: 5/4 TA - few GNR 5/3 TA - e.coli (R to ancef) 4/28 UC - neg 4/22 cdiff: positive  4/8 BCx - negative 4/4 Bcx: MSSA 4/4 Ucx: MSSA   Thank you for allowing pharmacy to be a part of this patient's care.  Rush Barer 02/20/2019 2:45 PM

## 2019-02-20 NOTE — Progress Notes (Signed)
Patient not a candidate for bedside PICC placement. Patient's PICC are placed by IR. Primary RN aware.

## 2019-02-20 NOTE — Progress Notes (Signed)
Patient ID: Molly [name], female   DOB: 1986/10/14, 33 y.o.   MRN: [id number]  This NP visited patient at the bedside as a follow up for for palliative medicine needs and emotional support; patient is awake and alert.  She is on trach collar and appears comfortable for her situation.  She is able to communicate with nods, hand gestures and mouthing.  She communicate comfort and no complaints at this time.   Emotional support offered.  Initial consult with palliative medicine completed on 01/31/2019  I spoke to patient's mother last week and as a family they  are open to all offered and available medical interventions to prolong life. Mother believes "she will get through this and get better"  Discussed with Bedside RN    PMT will shadow for needs, please call with questions or concers  Total time spent on the unit was 15 minutes  Greater than 50% of the time was spent in counseling and coordination of care  Wadie Lessen NP  Palliative Medicine Team Team Phone # [phone] Pager [phone]

## 2019-02-20 NOTE — Progress Notes (Signed)
MEDICATION RELATED CONSULT NOTE - INITIAL   Pharmacy Consult for Phenytoin Indication: Status epilepticus  Allergies  Allergen Reactions  . Lactose Intolerance (Gi)   . Vancomycin Other (See Comments)    Possible contributor to AKI and thrombocytopenia    Patient Measurements: Height: 5\' 1"  (154.9 cm) Weight: 126 lb 12.2 oz (57.5 kg) IBW/kg (Calculated) : 47.8  Vital Signs: BP: 115/98 (05/05 0600) Pulse Rate: 108 (05/05 0600) Intake/Output from previous day: 05/04 0701 - 05/05 0700 In: 2086.1 [I.V.:110; NG/GT:1640; IV Piggyback:336.1] Out: 2275 [Urine:2175; Stool:100] Intake/Output from this shift: Total I/O In: 940.1 [NG/GT:840; IV Piggyback:100.1] Out: 1000 [Urine:1000]  Labs: Recent Labs    02/17/19 0745  02/18/19 0409 02/19/19 0514 02/19/19 1700 02/20/19 0511  WBC  --    < > 4.0 3.4* 5.4 3.8*  HGB  --    < > 7.0* 6.4* 8.1* 7.7*  HCT  --    < > 23.3* 21.4* 26.8* 24.8*  PLT  --    < > 95* 86* 101* 101*  CREATININE 1.94*  --   --  1.67*  --  1.58*  MG  --   --  1.9  --   --   --   PHOS  --   --  2.6  --   --   --   ALBUMIN 1.2*  --  1.2*  --   --   --   PROT 6.7  --   --   --   --   --   AST 11*  --   --   --   --   --   ALT <5  --   --   --   --   --   ALKPHOS 87  --   --   --   --   --   BILITOT 0.2*  --   --   --   --   --    < > = values in this interval not displayed.   Estimated Creatinine Clearance: 42.1 mL/min (A) (by C-G formula based on SCr of 1.58 mg/dL (H)).  Assessment: Pt is a 76 yoF who was having seizures then developed status. She was previously on keppra for seizures, increased dose given clinical picture on 4/28. Started on phenytoin 4/28 and then held on 5/2 due to supratherapeutic level.  Level this am was 3.2 which corrects to about 7 based on albumin of 1.2  5/2 pheny 19.6, alb 1.2 - correct 46 >>hold 5/3 pheny 14.1 alb 1.2 - correct 33 >> continue to hold 5/5 pheny 3.2   alb 1.2 - correct 7 >> restart  Goal of Therapy:   Phenytoin level 10-20 mcg/mL   Plan:  Restart Phenytoin 100mg  PO TID  Check level in 5-7 days Follow up Dale, PharmD PGY1 Pharmacy Resident 02/20/2019    6:53 AM Please check AMION for all [place] numbers

## 2019-02-21 DIAGNOSIS — J155 Pneumonia due to Escherichia coli: Secondary | ICD-10-CM

## 2019-02-21 DIAGNOSIS — J9611 Chronic respiratory failure with hypoxia: Secondary | ICD-10-CM

## 2019-02-21 LAB — CBC WITH DIFFERENTIAL/PLATELET
Abs Immature Granulocytes: 0.02 10*3/uL (ref 0.00–0.07)
Basophils Absolute: 0 10*3/uL (ref 0.0–0.1)
Basophils Relative: 0 %
Eosinophils Absolute: 0 10*3/uL (ref 0.0–0.5)
Eosinophils Relative: 0 %
HCT: 22.5 % — ABNORMAL LOW (ref 36.0–46.0)
Hemoglobin: 7 g/dL — ABNORMAL LOW (ref 12.0–15.0)
Immature Granulocytes: 1 %
Lymphocytes Relative: 26 %
Lymphs Abs: 0.9 10*3/uL (ref 0.7–4.0)
MCH: 28.7 pg (ref 26.0–34.0)
MCHC: 31.1 g/dL (ref 30.0–36.0)
MCV: 92.2 fL (ref 80.0–100.0)
Monocytes Absolute: 0.3 10*3/uL (ref 0.1–1.0)
Monocytes Relative: 8 %
Neutro Abs: 2.2 10*3/uL (ref 1.7–7.7)
Neutrophils Relative %: 65 %
Platelets: 101 10*3/uL — ABNORMAL LOW (ref 150–400)
RBC: 2.44 MIL/uL — ABNORMAL LOW (ref 3.87–5.11)
RDW: 17.1 % — ABNORMAL HIGH (ref 11.5–15.5)
WBC: 3.4 10*3/uL — ABNORMAL LOW (ref 4.0–10.5)
nRBC: 0 % (ref 0.0–0.2)

## 2019-02-21 LAB — BASIC METABOLIC PANEL
Anion gap: 16 — ABNORMAL HIGH (ref 5–15)
BUN: 77 mg/dL — ABNORMAL HIGH (ref 6–20)
CO2: 16 mmol/L — ABNORMAL LOW (ref 22–32)
Calcium: 7.8 mg/dL — ABNORMAL LOW (ref 8.9–10.3)
Chloride: 112 mmol/L — ABNORMAL HIGH (ref 98–111)
Creatinine, Ser: 1.5 mg/dL — ABNORMAL HIGH (ref 0.44–1.00)
GFR calc Af Amer: 53 mL/min — ABNORMAL LOW (ref >60)
GFR calc non Af Amer: 46 mL/min — ABNORMAL LOW (ref >60)
Glucose, Bld: 113 mg/dL — ABNORMAL HIGH (ref 70–99)
Potassium: 3.7 mmol/L (ref 3.5–5.1)
Sodium: 144 mmol/L (ref 135–145)

## 2019-02-21 LAB — IRON AND TIBC
Iron: 47 ug/dL (ref 28–170)
Saturation Ratios: 39 % — ABNORMAL HIGH (ref 10.4–31.8)
TIBC: 119 ug/dL — ABNORMAL LOW (ref 250–450)
UIBC: 72 ug/dL

## 2019-02-21 LAB — GLUCOSE, CAPILLARY
Glucose-Capillary: 105 mg/dL — ABNORMAL HIGH (ref 70–99)
Glucose-Capillary: 111 mg/dL — ABNORMAL HIGH (ref 70–99)
Glucose-Capillary: 118 mg/dL — ABNORMAL HIGH (ref 70–99)
Glucose-Capillary: 121 mg/dL — ABNORMAL HIGH (ref 70–99)
Glucose-Capillary: 134 mg/dL — ABNORMAL HIGH (ref 70–99)
Glucose-Capillary: 55 mg/dL — ABNORMAL LOW (ref 70–99)
Glucose-Capillary: 85 mg/dL (ref 70–99)

## 2019-02-21 LAB — FERRITIN: Ferritin: 568 ng/mL — ABNORMAL HIGH (ref 11–307)

## 2019-02-21 MED ORDER — PHENYTOIN SODIUM 50 MG/ML IJ SOLN
100.0000 mg | Freq: Three times a day (TID) | INTRAMUSCULAR | Status: DC
  Administered 2019-02-21 (×2): 100 mg via INTRAVENOUS
  Filled 2019-02-21 (×3): qty 2

## 2019-02-21 MED ORDER — SODIUM BICARBONATE 8.4 % IV SOLN
100.0000 meq | Freq: Once | INTRAVENOUS | Status: AC
  Administered 2019-02-21: 11:00:00 100 meq via INTRAVENOUS
  Filled 2019-02-21: qty 50

## 2019-02-21 NOTE — Progress Notes (Signed)
eLink Physician-Brief Progress Note Patient Name: Molly [name] DOB: 01-23-1986 MRN: [id number]   Date of Service  02/21/2019  HPI/Events of Note  Needs morning labs ordered  eICU Interventions  CBC, BMET ordered        Frederik Pear 02/21/2019, 3:29 AM

## 2019-02-21 NOTE — Progress Notes (Signed)
Physical Therapy Treatment Patient Details Name: Molly [name] MRN: [id number] DOB: 05-Dec-1985 Today's Date: 02/21/2019    History of Present Illness Pt adm for acute respiratory failure due to Septic pulmonary embolus due to MRSA tricuspid vegetation. Pt intubated 01/20/19. Pt with multiple seizures 4/28-4/29. 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 Mod A of 2 therapists. Side stepping along the bed before fatigue. VSS on RA. Cont to rec CIR.     Follow Up Recommendations  CIR     Equipment Recommendations  Other (comment)(To be determined)    Recommendations for Other Services       Precautions / Restrictions Precautions Precautions: Fall;Other (comment) Precaution Comments: vent Restrictions Weight Bearing Restrictions: No    Mobility  Bed Mobility Overal bed mobility: Needs Assistance Bed Mobility: Supine to Sit;Sit to Supine     Supine to sit: +2 for physical assistance;Max assist Sit to supine: +2 for physical assistance;Max assist   General bed mobility comments: +2 max to total A for bed mobility positionnig hips, bringing legs back in and out of bed and sliding on bed pad.   Transfers Overall transfer level: Needs assistance Equipment used: Rolling walker (2 wheeled) Transfers: Sit to/from Stand Sit to Stand: +2 physical assistance;Mod assist         General transfer comment: tolerating standing EOB for several minutes, side stepped along head of bed with mod A of 2 for stability.   Ambulation/Gait                 Stairs             Wheelchair Mobility    Modified Rankin (Stroke Patients Only)       Balance Overall balance assessment: Needs assistance Sitting-balance support: Bilateral upper extremity supported;Feet  unsupported Sitting balance-Leahy Scale: Poor Sitting balance - Comments: Sat EOB 6-7 minutes with min assist for balance Postural control: Posterior lean                                  Cognition Arousal/Alertness: Awake/alert Behavior During Therapy: WFL for tasks assessed/performed Overall Cognitive Status: Difficult to assess Area of Impairment: Following commands;Problem solving                       Following Commands: Follows one step commands consistently;Follows one step commands with increased time     Problem Solving: Slow processing;Decreased initiation;Requires verbal cues;Requires tactile cues General Comments: Incr time required for instructions      Exercises Other Exercises Other Exercises: LAQ EOb x 10 each leg    General Comments        Pertinent Vitals/Pain Faces Pain Scale: Hurts even more Pain Location: Back Pain Descriptors / Indicators: Grimacing    Home Living                      Prior Function            PT Goals (current goals can now be found in the care plan section) Acute Rehab PT Goals Patient Stated Goal: Pt unable PT Goal Formulation: Patient unable to participate in goal setting Time For Goal Achievement: 03/01/19 Potential to Achieve Goals: Fair Progress towards PT  goals: Progressing toward goals    Frequency    Min 3X/week      PT Plan Current plan remains appropriate    Co-evaluation              AM-PAC PT "6 Clicks" Mobility   Outcome Measure  Help needed turning from your back to your side while in a flat bed without using bedrails?: Total Help needed moving from lying on your back to sitting on the side of a flat bed without using bedrails?: Total Help needed moving to and from a bed to a chair (including a wheelchair)?: Total Help needed standing up from a chair using your arms (e.g., wheelchair or bedside chair)?: Total Help needed to walk in hospital room?: Total Help  needed climbing 3-5 steps with a railing? : Total 6 Click Score: 6    End of Session Equipment Utilized During Treatment: Oxygen(trach collar) Activity Tolerance: Patient limited by fatigue Patient left: in bed;with bed alarm set;with call bell/phone within reach Nurse Communication: Mobility status PT Visit Diagnosis: Other abnormalities of gait and mobility (R26.89);Muscle weakness (generalized) (M62.81);Unsteadiness on feet (R26.81)     Time: 8333-8329 PT Time Calculation (min) (ACUTE ONLY): 24 min  Charges:  $Therapeutic Activity: 8-22 mins                     Reinaldo Berber, PT, DPT Acute Rehabilitation Services Pager: [phone] Office: [place] 02/21/2019, 11:27 AM

## 2019-02-21 NOTE — Progress Notes (Signed)
Trach sutures removed per MD order. No trach change per MD.

## 2019-02-21 NOTE — Progress Notes (Signed)
Occupational Therapy Treatment Patient Details Name: Molly [name] MRN: [id number] DOB: 04-May-1986 Today's Date: 02/21/2019    History of present illness Pt adm for acute respiratory failure due to Septic pulmonary embolus due to MRSA tricuspid vegetation. Pt intubated 01/20/19. Pt with multiple seizures 4/28-4/29. 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 Pt requiring maxA +2 for most bed mobility and sit to stands, but pt attempting. Pt requiring intermittent modA for sitting EOB x5 mins with posterior lean and right lateral lean. Pt with VSS and O2 >90% on RA with trach. Pt would benefit from continued OT skilled services for ADL, mobility and safety in SNF/CIR/LTACH setting. OT to follow acutely.    Follow Up Recommendations  CIR;SNF;LTACH;Supervision/Assistance - 24 hour    Equipment Recommendations  None recommended by OT    Recommendations for Other Services      Precautions / Restrictions Precautions Precautions: Fall Precaution Comments: vent Restrictions Weight Bearing Restrictions: No       Mobility Bed Mobility Overal bed mobility: Needs Assistance Bed Mobility: Supine to Sit;Sit to Supine Rolling: Max assist;+2 for physical assistance;+2 for safety/equipment   Supine to sit: Max assist;+2 for physical assistance Sit to supine: +2 for physical assistance;Max assist   General bed mobility comments: +2 max to total A for bed mobility positionnig hips, bringing legs back in and out of bed and sliding on bed pad.   Transfers Overall transfer level: Needs assistance Equipment used: Rolling walker (2 wheeled) Transfers: Sit to/from Stand Sit to Stand: Mod assist;+2 physical assistance;+2 safety/equipment         General transfer comment: tolerating standing EOB for several minutes, side stepped along head of bed with mod A of 2 for stability.      Balance Overall balance assessment: Needs assistance Sitting-balance support: Bilateral upper extremity supported;Feet unsupported Sitting balance-Leahy Scale: Poor Sitting balance - Comments: Sat EOB 5 minutes with mod to maxA for balance Postural control: Posterior lean;Right lateral lean   Standing balance-Leahy Scale: Poor Standing balance comment: reliant on UE support                           ADL either performed or assessed with clinical judgement   ADL Overall ADL's : Needs assistance/impaired                                     Functional mobility during ADLs: Maximal assistance;Rolling walker;+2 for physical assistance;+2 for safety/equipment General ADL Comments: Sitting EOB with maxA for support and relieved to modA for sitting upright.     Vision   Vision Assessment?: Vision impaired- to be further tested in functional context   Perception     Praxis      Cognition Arousal/Alertness: Awake/alert Behavior During Therapy: WFL for tasks assessed/performed Overall Cognitive Status: Difficult to assess Area of Impairment: Awareness;Safety/judgement                       Following Commands: Follows one step commands consistently     Problem Solving: Slow processing;Decreased initiation;Requires verbal cues;Requires tactile cues General Comments: Incr time required for instructions        Exercises Other Exercises Other Exercises: LAQ EOb x 10 each leg   Shoulder Instructions  General Comments      Pertinent Vitals/ Pain       Pain Assessment: Faces Faces Pain Scale: Hurts little more Pain Location: Back Pain Descriptors / Indicators: Grimacing Pain Intervention(s): Premedicated before session;Limited activity within patient's tolerance  Home Living                                          Prior Functioning/Environment              Frequency  Min 3X/week        Progress  Toward Goals  OT Goals(current goals can now be found in the care plan section)  Progress towards OT goals: Progressing toward goals  Acute Rehab OT Goals Patient Stated Goal: Pt unable OT Goal Formulation: With patient Time For Goal Achievement: 03/01/19 Potential to Achieve Goals: Good ADL Goals Pt Will Perform Grooming: with set-up;with supervision;sitting Pt Will Perform Upper Body Dressing: with set-up;with supervision;sitting Pt Will Perform Lower Body Dressing: with set-up;with supervision;sit to/from stand Pt Will Transfer to Toilet: with set-up;with supervision;ambulating Additional ADL Goal #1: Pt will peform bed mobility with supervision in preparation for ADLs  Plan Discharge plan remains appropriate    Co-evaluation    PT/OT/SLP Co-Evaluation/Treatment: Yes     OT goals addressed during session: ADL's and self-care      AM-PAC OT "6 Clicks" Daily Activity     Outcome Measure   Help from another person eating meals?: Total Help from another person taking care of personal grooming?: Total Help from another person toileting, which includes using toliet, bedpan, or urinal?: Total Help from another person bathing (including washing, rinsing, drying)?: Total Help from another person to put on and taking off regular upper body clothing?: Total Help from another person to put on and taking off regular lower body clothing?: Total 6 Click Score: 6    End of Session Equipment Utilized During Treatment: Gait belt  OT Visit Diagnosis: Other abnormalities of gait and mobility (R26.89);Unsteadiness on feet (R26.81);Muscle weakness (generalized) (M62.81);Other symptoms and signs involving cognitive function   Activity Tolerance Patient limited by pain;Treatment limited secondary to medical complications (Comment)   Patient Left in bed;with call bell/phone within reach;with bed alarm set   Nurse Communication Mobility status        Time: 7903-8333 OT Time Calculation  (min): 25 min  Charges: OT General Charges $OT Visit: 1 Visit OT Treatments $Neuromuscular Re-education: 8-22 mins  Darryl Nestle) Marsa Aris OTR/L Acute Rehabilitation Services Pager: [phone] Office: [place] 02/21/2019, 1:59 PM

## 2019-02-21 NOTE — Progress Notes (Signed)
NAME:  Molly [name], MRN:  [id number], DOB:  [birth year], LOS: 16 ADMISSION DATE:  01/20/2019, CONSULTATION DATE: 01/20/2019 REFERRING MD:  Bonner Puna, CHIEF COMPLAINT:  Sepsis  Brief History   33 y/o F with a history of IV drug abuse/polysubstance abuse (presented with urine drug screen positive for opiates and amphetamines) and a previous history in June or July 2019 of inadequately treated MRSA endocarditis having signed out AMA at that time.  She had a tricuspid vegetation at that time.  Admitted 4/4 hypotensive and in respiratory distress requiring intubation at [facility] long.   Admitted for acute respiratory failure/septic shock/ AKI due to septic emboli with MSSA bacteremia.  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 Awake / FC. Looks best in many days. Failed SBT 4/12 Failed SBT.  AKI improving, tx PRBC.  Less alert on precedex, methadone  4/13 More alert, FC. On precedex. Failed SBT. ID recommending PICC for antibiotcs > mother not sure about goals of care. S/p 1 unit PRBC   4/17 Trach placement 4/21 tolerating trach collar trials. 4/23 cdiff result positive overnight with continued diarrhea 4/26 seizure 4/28 status epilepticus  Consults:  Nephrology Neurology  Procedures:  ETT 4/4 >> 4/17 R Femoral line 4/4 >> 4/8 R IJ Tunneled CVC (IR) 4/21 >>  Trach 4/17 >>  Significant Diagnostic Tests:  Echocardiogram 4/4 >> TV vegetation measures 1.5 mm x 0.6 mm., severe TR CT Chest 4/4 >> bilateral  pulmonary nodules, peripheral and cavitated.  LE venous duplex 4/20 >> negative MRI brain 4/26 >> scattered T2/FLAIR most in parieto-occipital region b/l EEG 4/27 >> no evidence of seizures   Micro Data:  Blood cultures x2 4/4 >> MSSA  BCx2 4/8 >> negative   C diff 4/22 >> antigen positive, toxin negative, PCR positive 4/28 urine cx: neg 5/4 trach cx: ecoli, resistant to ancef   Antimicrobials:  Vancomycin Zosyn Teflaro >> stopped Ancef 4/4 >> HELD TO START CEFEPIME IN LIGHT OF ECOLI (resistent to ancef) Tx'ing for 7 DAYS THEN RESUME FOR END DATE OF 5/19 Diflucan 4/21 >> 4/24 Vancomycin po 4/23 >> 5/5   Cefepime 5/5->5/12 THEN RESUME ANCEF TO 5/19   Interim history/subjective:  5/5: still with hgb decrease, will check iron studies (likely bone marrow suppression from chronic illness with all cell lines down).   Objective   Blood pressure 101/68, pulse (!) 103, temperature 98.6 F (37 C), temperature source Oral, resp. rate (!) 31, height 5\' 1"  (1.549 m), weight 57 kg, SpO2 100 %.    Vent Mode: PRVC FiO2 (%):  [28 %-30 %] 28 % Set Rate:  [15 bmp] 15 bmp Vt Set:  [450 mL] 450 mL PEEP:  [5 cmH20] 5 cmH20 Plateau Pressure:  [17 cmH20-20 cmH20] 17 cmH20   Intake/Output Summary (Last 24 hours) at 02/21/2019 0854 Last data filed at 02/21/2019 0645 Gross per 24 hour  Intake 2656.06 ml  Output 2100 ml  Net 556.06 ml   Filed Weights   02/19/19 0500 02/20/19 0500 02/21/19 0356  Weight: 57.3 kg 57.5 kg 57 kg  General: Frail and cachectic appearing somnolent this am HEENT: Tracheostomy in place, ncat, perrl, eomi Neuro: Nods to questions and moves all 4 spontaneously   CV: 1 and S2 are regular without murmur rub or gallop, there is no JVD, there is no edema  PULM: Unlabored while breathing with vent  Symmetric air movement clear but diminished in b/l bases GI: Diminished flat and soft without any organomegaly masses tenderness guarding or rebound , multiple sites of bruising  Extremities: warm/dry negative edema, no splinters on the fingers Skin: no rashes or lesions   Resolved Hospital Problem list   Septic Shock, thrush  Active Problem list    New onset seizure 4/26 Status epilepticus 4/28 Continue Keppra and phenytoin Appreciate neuro. If no further sz then stop phenytoin on 5/7 Cont keppra thru d/c Anxiety:  Prn clonazepam melatonin   Hypertension Continue Lopressor.  Labetalol prn Systolic blood pressure less than 140 per neurology.   MSSA bacteremia with tricuspid valve endocarditis -Continue Ancef per infectious disease until 03/06/2019 (held due to resistance patterns of ecoli) -Restart once cefepime course complete on 5/12 C. difficile colitis -Enteral vancomycin ended on 02/20/19 ecoli pna:  -Resistant to ancef -started cefepime for 7 days then resume ancef thru 5/19  Acute resp failure with hypoxia Septic pulmonary emboi ecoli pna Tolerated trach collar ~8 hours on 02/20/2019 goal is 24/7 on trach collar.  -Secretions somewhat improved -5/4 cx with ecoli: on cefepime til 5/12 -On tct at time of my exam   Severe protein calorie malnutrition Dysphagia Continue tube feedings Swallow evaluation noted   AKI:  Cr improving with stable uop Follow Hypokalemia Cont replacement Continue to monitor electrolytes Metabolic acidosis:  Giving 2 amp bicarb    Anemia of chronic illness -hgb has dropped  this morning s/p 1U PRBC 5/4 -Transfuse <7 -no obvious bleeding noted  -Check iron panel.  -suspect 2/2 overall illness and malnourishment, possible ancef is contributing Thrombocytopenia leukopenia      Best practice:  Diet: tube feeds and PO nectar thick liquids DVT prophylaxis: SQ heparin GI prophylaxis: protonix Mobility: oob with assistance Code Status: Limited code Disposition: ICU  Labs:   CMP Latest Ref Rng & Units 02/21/2019 02/20/2019 02/19/2019  Glucose 70 - 99 mg/dL 113(H) 110(H) 120(H)  BUN 6 - 20 mg/dL 77(H)  70(H) 74(H)  Creatinine 0.44 - 1.00 mg/dL 1.50(H) 1.58(H) 1.67(H)  Sodium 135 - 145 mmol/L 144 142 144  Potassium 3.5 - 5.1 mmol/L 3.7 3.4(L) 3.4(L)  Chloride 98 - 111 mmol/L 112(H) 114(H) 115(H)  CO2 22 - 32 mmol/L 16(L) 14(L) 15(L)  Calcium 8.9 - 10.3 mg/dL 7.8(L) 7.6(L) 7.4(L)  Total Protein 6.5 - 8.1 g/dL - - -  Total Bilirubin 0.3 - 1.2 mg/dL - - -  Alkaline Phos 38 - 126 U/L - - -  AST 15 - 41 U/L - - -  ALT 0 - 44 U/L - - -   CBC Latest Ref Rng & Units 02/21/2019 02/20/2019 02/19/2019  WBC 4.0 - 10.5 K/uL 3.4(L) 3.8(L) 5.4  Hemoglobin 12.0 - 15.0 g/dL 7.0(L) 7.7(L) 8.1(L)  Hematocrit 36.0 - 46.0 % 22.5(L) 24.8(L) 26.8(L)  Platelets 150 - 400 K/uL 101(L) 101(L) 101(L)   ABG    Component Value Date/Time   PHART 7.439 02/13/2019 0941   PCO2ART 30.5 (L) 02/13/2019 0941   PO2ART 115.0 (H) 02/13/2019 0941   HCO3 20.7 02/13/2019 0941   TCO2 22 02/13/2019 0941   ACIDBASEDEF 3.0 (H) 02/13/2019 0941   O2SAT 99.0 02/13/2019  0941   CBG (last 3)   Critical care time: The patient is critically ill with multiple organ systems failure and requires high complexity decision making for assessment and support, frequent evaluation and titration of therapies, application of advanced monitoring technologies and extensive interpretation of multiple databases.  Critical care time 35 mins. This represents my time independent of the NPs time taking care of the pt. This is excluding procedures.    Audria Nine DO Pager: [phone] After hours pager: [phone]  [facility] 02/21/2019, 8:54 AM

## 2019-02-21 NOTE — Progress Notes (Signed)
  Speech Language Pathology Treatment: Dysphagia  Patient Details Name: Molly [name] MRN: [id number] DOB: 03/09/1986 Today's Date: 02/21/2019 Time: 1102-1117 SLP Time Calculation (min) (ACUTE ONLY): 29 min  Assessment / Plan / Recommendation Clinical Impression  Pt self-fed nectar thick liquids via straw with rapid sequential boluses, with no overt difficulty or changes in respiratory status. SLP attempted to provide finger occlusion but with no evidence of upper airway patency. No airflow was noted during blowing. No phonation produced. She primarily expresses herself by clicking with her tongue and mouthing, occasionally using gestures. Suspect she needs a smaller trach in order to utilize [place] with more success. Continue to recommend downsizing trach to facilitate communication and airway protection with advanced POs.    HPI HPI: Molly [name] 33 year old female who presented to the emergency department with altered mental status.  Patient was admitted to the hospital with working diagnosis of sepsis with multiorgan failure and possible DIC.  Patient condition deteriorated and required invasive mechanical ventilation 4/4.  CT of the chest was done which was positive for septic emboli and right base pneumonia.  Blood cultures grew MSSA.  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  Patient has history of polysubstance/IV drug abuse.  Patient has been on antibiotic therapy with cefazolin.  Underwent tracheostomy on 3/56, which was complicated by bleeding.      SLP Plan  Continue with current plan of care       Recommendations  Diet recommendations: Nectar-thick liquid Liquids provided via: Cup;Straw Medication Administration: Whole meds with liquid Supervision: Patient able to self feed Compensations: Minimize environmental distractions Postural Changes and/or Swallow Maneuvers: Seated upright 90 degrees;Upright 30-60 min after meal      Patient may use Passy-[place]  Speech Valve: with SLP only MD: Please consider changing trach tube to : Smaller size         Oral Care Recommendations: Oral care BID Follow up Recommendations: Inpatient Rehab SLP Visit Diagnosis: Dysphagia, oropharyngeal phase (R13.12) Plan: Continue with current plan of care       GO                Venita Sheffield Baila Rouse 02/21/2019, 2:20 PM  [name], M.A. [place] Acute Environmental education officer [phone] Office [phone]

## 2019-02-22 DIAGNOSIS — J181 Lobar pneumonia, unspecified organism: Secondary | ICD-10-CM

## 2019-02-22 LAB — GLUCOSE, CAPILLARY
Glucose-Capillary: 108 mg/dL — ABNORMAL HIGH (ref 70–99)
Glucose-Capillary: 112 mg/dL — ABNORMAL HIGH (ref 70–99)
Glucose-Capillary: 91 mg/dL (ref 70–99)

## 2019-02-22 LAB — CBC WITH DIFFERENTIAL/PLATELET
Abs Immature Granulocytes: 0.03 10*3/uL (ref 0.00–0.07)
Basophils Absolute: 0 10*3/uL (ref 0.0–0.1)
Basophils Relative: 0 %
Eosinophils Absolute: 0 10*3/uL (ref 0.0–0.5)
Eosinophils Relative: 0 %
HCT: 22.7 % — ABNORMAL LOW (ref 36.0–46.0)
Hemoglobin: 7.1 g/dL — ABNORMAL LOW (ref 12.0–15.0)
Immature Granulocytes: 1 %
Lymphocytes Relative: 23 %
Lymphs Abs: 0.9 10*3/uL (ref 0.7–4.0)
MCH: 28.9 pg (ref 26.0–34.0)
MCHC: 31.3 g/dL (ref 30.0–36.0)
MCV: 92.3 fL (ref 80.0–100.0)
Monocytes Absolute: 0.4 10*3/uL (ref 0.1–1.0)
Monocytes Relative: 9 %
Neutro Abs: 2.6 10*3/uL (ref 1.7–7.7)
Neutrophils Relative %: 67 %
Platelets: 125 10*3/uL — ABNORMAL LOW (ref 150–400)
RBC: 2.46 MIL/uL — ABNORMAL LOW (ref 3.87–5.11)
RDW: 17.2 % — ABNORMAL HIGH (ref 11.5–15.5)
WBC: 3.9 10*3/uL — ABNORMAL LOW (ref 4.0–10.5)
nRBC: 0 % (ref 0.0–0.2)

## 2019-02-22 LAB — BASIC METABOLIC PANEL
Anion gap: 14 (ref 5–15)
BUN: 78 mg/dL — ABNORMAL HIGH (ref 6–20)
CO2: 15 mmol/L — ABNORMAL LOW (ref 22–32)
Calcium: 7.7 mg/dL — ABNORMAL LOW (ref 8.9–10.3)
Chloride: 115 mmol/L — ABNORMAL HIGH (ref 98–111)
Creatinine, Ser: 1.39 mg/dL — ABNORMAL HIGH (ref 0.44–1.00)
GFR calc Af Amer: 58 mL/min — ABNORMAL LOW (ref >60)
GFR calc non Af Amer: 50 mL/min — ABNORMAL LOW (ref >60)
Glucose, Bld: 123 mg/dL — ABNORMAL HIGH (ref 70–99)
Potassium: 3.7 mmol/L (ref 3.5–5.1)
Sodium: 144 mmol/L (ref 135–145)

## 2019-02-22 LAB — CULTURE, RESPIRATORY W GRAM STAIN: Special Requests: NORMAL

## 2019-02-22 LAB — CULTURE, RESPIRATORY

## 2019-02-22 LAB — MRSA PCR SCREENING: MRSA by PCR: NEGATIVE

## 2019-02-22 MED ORDER — SODIUM CHLORIDE 0.9 % IV SOLN
2.0000 g | Freq: Two times a day (BID) | INTRAVENOUS | Status: DC
  Filled 2019-02-22 (×2): qty 2

## 2019-02-22 MED ORDER — LINEZOLID 100 MG/5ML PO SUSR
600.0000 mg | Freq: Two times a day (BID) | ORAL | Status: DC
  Filled 2019-02-22: qty 30

## 2019-02-22 MED ORDER — SODIUM BICARBONATE 650 MG PO TABS
650.0000 mg | ORAL_TABLET | Freq: Three times a day (TID) | ORAL | Status: AC
  Administered 2019-02-22 – 2019-02-26 (×15): 650 mg via ORAL
  Filled 2019-02-22 (×16): qty 1

## 2019-02-22 MED ORDER — SODIUM CHLORIDE 0.9 % IV SOLN
2.0000 g | Freq: Two times a day (BID) | INTRAVENOUS | Status: AC
  Administered 2019-02-22 – 2019-02-28 (×14): 2 g via INTRAVENOUS
  Filled 2019-02-22 (×13): qty 2

## 2019-02-22 MED ORDER — LINEZOLID 600 MG PO TABS
600.0000 mg | ORAL_TABLET | Freq: Two times a day (BID) | ORAL | Status: DC
  Administered 2019-02-22 – 2019-02-26 (×10): 600 mg
  Filled 2019-02-22 (×11): qty 1

## 2019-02-22 NOTE — Progress Notes (Signed)
NAME:  Molly [name], MRN:  [id number], DOB:  04-Dec-1985, LOS: 53 ADMISSION DATE:  01/20/2019, CONSULTATION DATE: 01/20/2019 REFERRING MD:  Bonner Puna, CHIEF COMPLAINT:  Sepsis  Brief History   33 y/o F with a history of IV drug abuse/polysubstance abuse (presented with urine drug screen positive for opiates and amphetamines) and a previous history in June or July 2019 of inadequately treated MRSA endocarditis having signed out AMA at that time.  She had a tricuspid vegetation at that time.  Admitted 4/4 hypotensive and in respiratory distress requiring intubation at [facility].   Admitted for acute respiratory failure/septic shock/ AKI due to septic emboli with MSSA bacteremia.  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 Awake / FC. Looks best in many days. Failed SBT 4/12 Failed SBT.  AKI improving, tx PRBC.  Less alert on precedex, methadone  4/13 More alert, FC. On precedex. Failed SBT. ID recommending PICC for antibiotcs > mother not sure about goals of care. S/p 1 unit PRBC   4/17 Trach placement 4/21 tolerating trach collar trials. 4/23 cdiff result positive overnight with continued diarrhea 4/26 seizure 4/28 status epilepticus  Consults:  Nephrology Neurology  Procedures:  ETT 4/4 >> 4/17 R Femoral line 4/4 >> 4/8 R IJ Tunneled CVC (IR) 4/21 >>  Trach 4/17 >>  Significant Diagnostic Tests:  Echocardiogram 4/4 >> TV vegetation measures 1.5 mm x 0.6 mm., severe TR CT Chest 4/4 >> bilateral  pulmonary nodules, peripheral and cavitated.  LE venous duplex 4/20 >> negative MRI brain 4/26 >> scattered T2/FLAIR most in parieto-occipital region b/l EEG 4/27 >> no evidence of seizures   Micro Data:  Blood cultures x2 4/4 >> MSSA  BCx2 4/8 >> negative   C diff 4/22 >> antigen positive, toxin negative, PCR positive 4/28 urine cx: neg 5/4 trach cx: ecoli, resistant to ancef/ enterococcus  Antimicrobials:  Vancomycin Zosyn Teflaro >> stopped Ancef 4/4 >> HELD TO START CEFEPIME IN LIGHT OF ECOLI (resistent to ancef) but now new enterococcus req merrem Tx'ing for 7 DAYS THEN RESUME FOR END DATE OF 5/19 Diflucan 4/21 >> 4/24 Vancomycin po 4/23 >> 5/5   Cefepime 5/5->5/7  Merrem 5/7-5/14:THEN RESUME ANCEF TO 5/19   Interim history/subjective:  5/7: hgb stable today, start bicarb tabs. Sputum cx from 5/4 now positive for enterococcus so abx changed again until sensitivities back.   Objective   Blood pressure (!) 113/94, pulse (!) 107, temperature 99.1 F (37.3 C), temperature source Oral, resp. rate (!) 26, height 5\' 1"  (1.549 m), weight 56.8 kg, SpO2 100 %.    Vent Mode: PRVC FiO2 (%):  [28 %-30 %] 30 % Set Rate:  [15 bmp] 15 bmp Vt Set:  [380 mL] 380 mL PEEP:  [5 cmH20] 5 cmH20 Plateau Pressure:  [8 cmH20-15 cmH20] 11 cmH20   Intake/Output Summary (Last 24 hours) at 02/22/2019 0722 Last data filed at 02/22/2019 0600 Gross per 24 hour  Intake 3000 ml  Output 3050 ml  Net -50 ml   Filed Weights   02/20/19 0500 02/21/19 0356  02/22/19 0411  Weight: 57.5 kg 57 kg 56.8 kg   General: Frail and cachectic appearing somnolent this am HEENT: Tracheostomy in place, ncat, perrl, eomi Neuro: Nods to questions and moves all 4 spontaneously   CV: 1 and S2 are regular without murmur rub or gallop, there is no JVD, there is no edema  PULM: Unlabored while breathing with vent  Symmetric air movement clear but diminished in b/l bases GI: Diminished flat and soft without any organomegaly  masses tenderness guarding or rebound , multiple sites of bruising Extremities: warm/dry negative edema, no splinters on the fingers Skin: no rashes or lesions   Resolved Hospital Problem list   Septic Shock, thrush  Active Problem list    New onset seizure 4/26 Status epilepticus 4/28 Continue Keppra  Appreciate neuro per recs since no further sz stopping phenytoin today 5/7 Anxiety:  Prn clonazepam melatonin   Hypertension Continue Lopressor.  Labetalol prn Systolic blood pressure less than 140 per neurology.   MSSA bacteremia with tricuspid valve endocarditis -Continue Ancef per infectious disease until 03/06/2019 (held due to resistance patterns of ecoli) -Restart once merrem course complete on 5/14 (may be able to de-escalate based on sensitivities) C. difficile colitis -s/p tx ecoli/enterococcus pna:  -Resistant to ancef -started cefepime but changing to merrem until sensitivities of enterococcus back thru 5/14 then back to ancef to complete tx for MSSA bacteremia/endocarditis -changing trach if secretions decrease in next 24 hours.   Acute resp failure with hypoxia s/p trach Septic pulmonary emboi ecoli pna Tolerating longer tct hours on 02/22/2019 goal is 24/7 on trach collar.  -Secretions somewhat improved, consider changing downsizing trach to 4 cuffed with trach change if secretions remain decreased next 24-48 hours.  -5/4 cx with ecoli: on cefepime til 5/12 -On tct at time of my exam -sutures removed 5/7   Severe protein calorie malnutrition Dysphagia Continue tube feedings Swallow evaluation noted   AKI:  Cr improving with stable uop Follow Hypokalemia Cont replacement Continue to monitor electrolytes Metabolic acidosis:  Starting bicarb tabs.    Anemia of chronic illness -hgb stable  this morning -Transfuse <7 -no obvious bleeding noted  -iron panel without need for supplementation -suspect 2/2 overall illness and malnourishment, possible  ancef is contributing Thrombocytopenia: improving leukopenia      Best practice:  Diet: tube feeds and PO nectar thick liquids DVT prophylaxis: SQ heparin GI prophylaxis: protonix Mobility: oob with assistance Code Status: Limited code Disposition: ICU  Labs:   CMP Latest Ref Rng & Units 02/22/2019 02/21/2019 02/20/2019  Glucose 70 - 99 mg/dL 123(H) 113(H) 110(H)  BUN 6 - 20 mg/dL 78(H) 77(H) 70(H)  Creatinine 0.44 - 1.00 mg/dL 1.39(H) 1.50(H) 1.58(H)  Sodium 135 - 145 mmol/L 144 144 142  Potassium 3.5 - 5.1 mmol/L 3.7 3.7 3.4(L)  Chloride 98 - 111 mmol/L 115(H) 112(H) 114(H)  CO2 22 - 32 mmol/L 15(L) 16(L) 14(L)  Calcium 8.9 - 10.3 mg/dL 7.7(L) 7.8(L) 7.6(L)  Total Protein 6.5 - 8.1 g/dL - - -  Total Bilirubin 0.3 - 1.2 mg/dL - - -  Alkaline Phos 38 - 126 U/L - - -  AST 15 - 41 U/L - - -  ALT 0 - 44 U/L - - -   CBC Latest Ref Rng & Units 02/22/2019 02/21/2019 02/20/2019  WBC 4.0 - 10.5 K/uL 3.9(L) 3.4(L) 3.8(L)  Hemoglobin 12.0 - 15.0 g/dL 7.1(L) 7.0(L) 7.7(L)  Hematocrit 36.0 - 46.0 % 22.7(L) 22.5(L) 24.8(L)  Platelets 150 - 400 K/uL 125(L)  101(L) 101(L)   ABG    Component Value Date/Time   PHART 7.439 02/13/2019 0941   PCO2ART 30.5 (L) 02/13/2019 0941   PO2ART 115.0 (H) 02/13/2019 0941   HCO3 20.7 02/13/2019 0941   TCO2 22 02/13/2019 0941   ACIDBASEDEF 3.0 (H) 02/13/2019 0941   O2SAT 99.0 02/13/2019 0941   CBG (last 3)   Critical care time: The patient is critically ill with multiple organ systems failure and requires high complexity decision making for assessment and support, frequent evaluation and titration of therapies, application of advanced monitoring technologies and extensive interpretation of multiple databases.  Critical care time 33 mins. This represents my time independent of the NPs time taking care of the pt. This is excluding procedures.    Audria Nine DO Pager: [phone] After hours pager: [phone]  [facility]  02/22/2019, 7:22 AM

## 2019-02-22 NOTE — Progress Notes (Signed)
eLink Physician-Brief Progress Note Patient Name: Molly [name] DOB: 12-05-1985 MRN: [id number]   Date of Service  02/22/2019  HPI/Events of Note  Patient needs AM labs  eICU Interventions  Order for AM labs entered        Frederik Pear 02/22/2019, 3:24 AM

## 2019-02-22 NOTE — Progress Notes (Signed)
Patient placed on trach collar 5L 28%. Patient is tolerating well at this time. Vitals are stable. RT will continue to monitor.

## 2019-02-22 NOTE — Progress Notes (Signed)
Patient's mother, Nasya Vincent, was updated by phone at patient's request.

## 2019-02-23 LAB — GLUCOSE, CAPILLARY
Glucose-Capillary: 109 mg/dL — ABNORMAL HIGH (ref 70–99)
Glucose-Capillary: 104 mg/dL — ABNORMAL HIGH (ref 70–99)
Glucose-Capillary: 118 mg/dL — ABNORMAL HIGH (ref 70–99)

## 2019-02-23 MED ORDER — LEVETIRACETAM 100 MG/ML PO SOLN
1000.0000 mg | Freq: Two times a day (BID) | ORAL | Status: DC
  Administered 2019-02-23 – 2019-02-26 (×8): 1000 mg
  Filled 2019-02-23 (×10): qty 10

## 2019-02-23 NOTE — Progress Notes (Signed)
NAME:  Molly [name], MRN:  [id number], DOB:  Apr 06, 1986, LOS: 64 ADMISSION DATE:  01/20/2019, CONSULTATION DATE: 01/20/2019 REFERRING MD:  Bonner Puna, CHIEF COMPLAINT:  Sepsis  Brief History   33 y/o F with a history of IV drug abuse/polysubstance abuse (presented with urine drug screen positive for opiates and amphetamines) and a previous history in June or July 2019 of inadequately treated MRSA endocarditis having signed out AMA at that time.  She had a tricuspid vegetation at that time.  Admitted 4/4 hypotensive and in respiratory distress requiring intubation at [facility].   Admitted for acute respiratory failure/septic shock/ AKI due to septic emboli with MSSA bacteremia.  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 Awake / FC. Looks best in many days. Failed SBT 4/12 Failed SBT.  AKI improving, tx PRBC.  Less alert on precedex, methadone  4/13 More alert, FC. On precedex. Failed SBT. ID recommending PICC for antibiotcs > mother not sure about goals of care. S/p 1 unit PRBC   4/17 Trach placement 4/21 tolerating trach collar trials. 4/23 cdiff result positive overnight with continued diarrhea 4/26 seizure 4/28 status epilepticus  Consults:  Nephrology Neurology  Procedures:  ETT 4/4 >> 4/17 R Femoral line 4/4 >> 4/8 R IJ Tunneled CVC (IR) 4/21 >>  Trach 4/17 >>  Significant Diagnostic Tests:  Echocardiogram 4/4 >> TV vegetation measures 1.5 mm x 0.6 mm., severe TR CT Chest 4/4 >> bilateral  pulmonary nodules, peripheral and cavitated.  LE venous duplex 4/20 >> negative MRI brain 4/26 >> scattered T2/FLAIR most in parieto-occipital region b/l EEG 4/27 >> no evidence of seizures   Micro Data:  Blood cultures x2 4/4 >> MSSA  BCx2 4/8 >> negative   C diff 4/22 >> antigen positive, toxin negative, PCR positive 4/28 urine cx: neg 5/4 trach cx: ecoli, resistant to ancef/ enterococcus  Antimicrobials:  Vancomycin Zosyn Teflaro >> stopped Ancef 4/4 >> HELD TO START CEFEPIME IN LIGHT OF ECOLI (resistent to ancef) but now new enterococcus req merrem Tx'ing for 7 DAYS THEN RESUME FOR END DATE OF 5/19 Diflucan 4/21 >> 4/24 Vancomycin po 4/23 >> 5/5   Cefepime 5/5->5/7  Merrem 5/7-5/14:THEN RESUME ANCEF TO 5/19   Interim history/subjective:  Awake, off vent x 24 hours. Tx to [facility] AND WE WILL FOLLOW prn trach and vent  Objective   Blood pressure (!) 129/108, pulse (!) 116, temperature 98.6 F (37 C), temperature source Oral, resp. rate (!) 40, height 5\' 1"  (1.549 m), weight 54.7 kg, SpO2 100 %.    FiO2 (%):  [28 %] 28 %   Intake/Output Summary (Last 24 hours) at 02/23/2019 1042 Last data filed at 02/23/2019 0223 Gross per 24 hour  Intake 3079.21 ml  Output 1825 ml  Net 1254.21 ml   Filed Weights   02/21/19 0356 02/22/19 0411 02/23/19 0500  Weight: 57 kg 56.8 kg 54.7 kg   General:  Frail female NAD HEENT: Trach # 6 cuffed in place Neuro: somnolent but follows commands CV: s1s2 rrr, no m/r/g PULM: even/non-labored, lungs  bilaterally diminshed VE:LFYB, non-tender, bsx4 active  Extremities: warm/dry, neg edema  Skin: no rashes or lesions    Resolved Hospital Problem list   Septic Shock, thrush  Active Problem list    New onset seizure 4/26 Status epilepticus 4/28 Continue Keppra Neuro following , dilatin stopped 5/7 per their rec.  Appreciate neuro per recs since no further sz stopping phenytoin today 5/7   Anxiety:  PRN klonopin melatonin      Hypertension Lopresor PRN Labetalol SBP < 140 per neuro    MSSA bacteremia with tricuspid valve endocarditis -Continue Ancef per infectious disease until 03/06/2019 (held due to resistance patterns of ecoli) -Restart once merrem course complete on 5/14 (may be able to de-escalate based on sensitivities) C. difficile colitis -s/p tx ecoli/enterococcus pna:  Cefepime + zyvox     Acute resp failure with hypoxia s/p trach Septic pulmonary emboi ecoli pna Currently 25 hours on t collar Change trach to 6 cuffed but no smaller due to ecretions 5/4 cx with e coli on cefepime till 5/12 and zyvox t collar as tolerated   Severe protein calorie malnutrition Dysphagia TF Continue thickened diet in setting of malnourishment    AKI:  Lab Results  Component Value Date   CREATININE 1.39 (H) 02/22/2019   CREATININE 1.50 (H) 02/21/2019   CREATININE 1.58 (H) 02/20/2019   CREATININE 0.97 11/02/2016   CREATININE 0.79 08/24/2016   Recent Labs  Lab 02/20/19 0511 02/21/19 0352 02/22/19 0420  K 3.4* 3.7 3.7    Monitor creatine  Hypokalemia Monitor and replete as needed  Metabolic acidosis:  Bicarb tabs   Anemia of chronic illness Recent Labs    02/21/19 0352 02/22/19 0420  HGB 7.0* 7.1*    Transfuse per protocol   Thrombocytopenia: improving 101->125 Leukopenia 3.5->3.9 Monitor     Best practice:  Diet: tube feeds and PO nectar thick liquids DVT prophylaxis: SQ heparin GI prophylaxis: protonix Mobility: oob with assistance Code Status: Limited code Disposition: ICU, Awake, off vent x 24 hours. Tx to [facility] 02/23/19 AND WE WILL FOLLOW prn trach and vent  Labs:   CMP Latest Ref Rng & Units 02/22/2019 02/21/2019 02/20/2019  Glucose 70 - 99 mg/dL 123(H) 113(H) 110(H)  BUN 6 - 20 mg/dL 78(H) 77(H) 70(H)  Creatinine 0.44 - 1.00 mg/dL 1.39(H) 1.50(H) 1.58(H)  Sodium 135 - 145 mmol/L 144 144 142  Potassium 3.5 - 5.1 mmol/L 3.7 3.7 3.4(L)  Chloride 98 - 111  mmol/L 115(H) 112(H) 114(H)  CO2 22 - 32 mmol/L 15(L) 16(L) 14(L)  Calcium 8.9 - 10.3 mg/dL 7.7(L) 7.8(L) 7.6(L)  Total Protein 6.5 - 8.1 g/dL - - -  Total Bilirubin 0.3 - 1.2 mg/dL - - -  Alkaline Phos 38 - 126 U/L - - -  AST 15 - 41 U/L - - -  ALT 0 - 44 U/L - - -   CBC Latest Ref Rng & Units 02/22/2019 02/21/2019 02/20/2019  WBC 4.0 - 10.5 K/uL 3.9(L) 3.4(L) 3.8(L)  Hemoglobin 12.0 - 15.0 g/dL 7.1(L) 7.0(L) 7.7(L)  Hematocrit 36.0 - 46.0 % 22.7(L) 22.5(L) 24.8(L)  Platelets 150 - 400 K/uL 125(L) 101(L) 101(L)   ABG    Component Value Date/Time   PHART 7.439 02/13/2019 0941   PCO2ART 30.5 (L) 02/13/2019 0941   PO2ART 115.0 (H) 02/13/2019 0941   HCO3 20.7 02/13/2019 0941   TCO2 22 02/13/2019 0941   ACIDBASEDEF 3.0 (H) 02/13/2019 0941   O2SAT 99.0 02/13/2019 0941   CBG (last 3)  Critical care time: App CCT 30 min    Richardson Landry Deija Buhrman ACNP Maryanna Shape PCCM Pager [phone] till 1 pm If no answer page [phone] 02/23/2019, 10:42 AM

## 2019-02-23 NOTE — Procedures (Signed)
Tracheostomy Change Note  Patient Details:   Name: Ayesha Markwell DOB: 07/11/1986 MRN: [id number]    Airway Documentation:     Evaluation  O2 sats: stable throughout Complications: No apparent complications Patient did tolerate procedure well. Bilateral Breath Sounds: Diminished, Clear   Trach was changed to #6 shiley cuff (deflated cuff). Due to meeting resistance during change, CCM was asked to come in and assist. Positive color change noted on new trach and trach was secured.     Ronaldo Miyamoto 02/23/2019, 11:39 AM

## 2019-02-23 NOTE — Progress Notes (Signed)
Physical Therapy Treatment Patient Details Name: Molly [name] MRN: [id number] DOB: 1986-09-18 Today's Date: 02/23/2019    History of Present Illness Pt adm for acute respiratory failure due to Septic pulmonary embolus due to MRSA tricuspid vegetation. Pt intubated 01/20/19. Pt with multiple seizures 4/28-4/29. 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 User may not have seen previous data.)    PT Comments    Patient limited this visit, leathergic after trach change today, with 1 bout of emesis per RN. perforemd bed level therex, mostly AAROM due to patient in and out of sleep. Unable to stay alert enough for bed mobility or transfers today. Will cont to follow.     Follow Up Recommendations  CIR     Equipment Recommendations  Other (comment)(To be determined)    Recommendations for Other Services       Precautions / Restrictions Precautions Precautions: Fall(Simultaneous filing. User may not have seen previous data.) Precaution Comments: vent Restrictions Weight Bearing Restrictions: No(Simultaneous filing. User may not have seen previous data.)    Mobility  Bed Mobility Overal bed mobility: Needs Assistance             General bed mobility comments: pt too fatigued and not following commands with eyes open long enough to achieve bed mobility today(Simultaneous filing. User may not have seen previous data.)  Transfers Overall transfer level: Needs assistance               General transfer comment: unable to test  Ambulation/Gait                 Stairs             Wheelchair Mobility    Modified Rankin (Stroke Patients Only)       Balance Overall balance assessment: Needs assistance Sitting-balance support: Bilateral upper extremity supported;Feet unsupported Sitting balance-Leahy Scale: Poor Sitting balance - Comments: Sat EOB 6-7  minutes with min assist for balance Postural control: Posterior lean                                  Cognition Arousal/Alertness: Awake/alert(Simultaneous filing. User may not have seen previous data.) Behavior During Therapy: [facility] for tasks assessed/performed(Simultaneous filing. User may not have seen previous data.) Overall Cognitive Status: Difficult to assess(Simultaneous filing. User may not have seen previous data.) Area of Impairment: Awareness;Safety/judgement(Simultaneous filing. User may not have seen previous data.)                       Following Commands: Follows one step commands consistently(Simultaneous filing. User may not have seen previous data.)     Problem Solving: Slow processing;Decreased initiation;Requires verbal cues;Requires tactile cues General Comments: Incr time required for instructions(Simultaneous filing. User may not have seen previous data.)      Exercises General Exercises - Upper Extremity Shoulder Flexion: PROM;10 reps;Supine General Exercises - Lower Extremity Ankle Circles/Pumps: AROM;Both;10 reps;Supine Short Arc Quad: AAROM;Both;10 reps;Supine Heel Slides: AAROM;Both;10 reps;Supine Hip ABduction/ADduction: AAROM;Both;10 reps;Supine Straight Leg Raises: AAROM;Both;10 reps;Supine    General Comments General comments (skin integrity, edema, etc.): Pt more fatigue today and unable to follow commands today as pt unable to properly arouse      Pertinent Vitals/Pain Pain Assessment: Faces Faces Pain Scale: Hurts a little bit(Simultaneous filing. User may not have seen previous data.) Pain Location: Back(Simultaneous filing. User  may not have seen previous data.) Pain Descriptors / Indicators: Grimacing(Simultaneous filing. User may not have seen previous data.) Pain Intervention(s): Limited activity within patient's tolerance    Home Living                      Prior Function            PT Goals (current  goals can now be found in the care plan section) Acute Rehab PT Goals Patient Stated Goal: Pt unable(Simultaneous filing. User may not have seen previous data.) PT Goal Formulation: Patient unable to participate in goal setting Time For Goal Achievement: 03/01/19 Potential to Achieve Goals: Fair    Frequency    Min 3X/week      PT Plan Current plan remains appropriate    Co-evaluation              AM-PAC PT "6 Clicks" Mobility   Outcome Measure  Help needed turning from your back to your side while in a flat bed without using bedrails?: Total Help needed moving from lying on your back to sitting on the side of a flat bed without using bedrails?: Total Help needed moving to and from a bed to a chair (including a wheelchair)?: Total Help needed standing up from a chair using your arms (e.g., wheelchair or bedside chair)?: Total Help needed to walk in hospital room?: Total Help needed climbing 3-5 steps with a railing? : Total 6 Click Score: 6    End of Session Equipment Utilized During Treatment: Oxygen(trach collar) Activity Tolerance: Patient limited by fatigue Patient left: in bed;with bed alarm set;with call bell/phone within reach Nurse Communication: Mobility status PT Visit Diagnosis: Other abnormalities of gait and mobility (R26.89);Muscle weakness (generalized) (M62.81);Unsteadiness on feet (R26.81)     Time: 4098-1191 PT Time Calculation (min) (ACUTE ONLY): 9 min  Charges:  $Therapeutic Exercise: 8-22 mins                     Reinaldo Berber, PT, DPT Acute Rehabilitation Services Pager: [phone] Office: [phone]    Reinaldo Berber 02/23/2019, 3:42 PM

## 2019-02-23 NOTE — Progress Notes (Signed)
Occupational Therapy Treatment Patient Details Name: Molly [name] MRN: [id number] DOB: [birth year] Today's Date: 02/23/2019    History of present illness Pt adm for acute respiratory failure due to Septic pulmonary embolus due to MRSA tricuspid vegetation. Pt intubated 01/20/19. Pt with multiple seizures 4/28-4/29. 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 User may not have seen previous data.)   OT comments  Patient limited this visit, lethargic after trach change today, with 1 bout of emesis per RN. AAROM/PROM performed to BUEs, but pt unable to fully arouse. Pt following 25% of commands due to severe fatigue from events earlier today. Assuming continued OT when pt is more alert, pt will progress with acute OT and requires continued post acute OT.    Follow Up Recommendations  CIR;SNF;LTACH;Supervision/Assistance - 24 hour    Equipment Recommendations  None recommended by OT    Recommendations for Other Services      Precautions / Restrictions Precautions Precautions: Fall(Simultaneous filing. User may not have seen previous data.) Precaution Comments: vent Restrictions Weight Bearing Restrictions: No(Simultaneous filing. User may not have seen previous data.)       Mobility Bed Mobility Overal bed mobility: Needs Assistance             General bed mobility comments: pt too fatigued and not following commands with eyes open long enough to achieve bed mobility today(Simultaneous filing. User may not have seen previous data.)  Transfers Overall transfer level: Needs assistance               General transfer comment: unable to test    Balance Overall balance assessment: Needs assistance Sitting-balance support: Bilateral upper extremity supported;Feet unsupported Sitting balance-Leahy Scale: Poor Sitting balance - Comments: Sat EOB 6-7 minutes with min assist for  balance Postural control: Posterior lean                                 ADL either performed or assessed with clinical judgement   ADL Overall ADL's : Needs assistance/impaired                                     Functional mobility during ADLs: +2 for physical assistance General ADL Comments: unable to perform ADL at this time due to lethargy     Vision   Vision Assessment?: Vision impaired- to be further tested in functional context Additional Comments: wears glasses   Perception     Praxis      Cognition Arousal/Alertness: Awake/alert(Simultaneous filing. User may not have seen previous data.) Behavior During Therapy: [facility] for tasks assessed/performed(Simultaneous filing. User may not have seen previous data.) Overall Cognitive Status: Difficult to assess(Simultaneous filing. User may not have seen previous data.) Area of Impairment: Awareness;Safety/judgement(Simultaneous filing. User may not have seen previous data.)                       Following Commands: Follows one step commands consistently(Simultaneous filing. User may not have seen previous data.)     Problem Solving: Slow processing;Decreased initiation;Requires verbal cues;Requires tactile cues General Comments: Incr time required for instructions(Simultaneous filing. User may not have seen previous data.)        Exercises Exercises: Other exercises General Exercises - Upper Extremity Shoulder Flexion: PROM;10 reps;Supine General Exercises -  Lower Extremity Ankle Circles/Pumps: AROM;Both;10 reps;Supine Short Arc Quad: AAROM;Both;10 reps;Supine Heel Slides: AAROM;Both;10 reps;Supine Hip ABduction/ADduction: AAROM;Both;10 reps;Supine Straight Leg Raises: AAROM;Both;10 reps;Supine   Shoulder Instructions       General Comments Pt more fatigue today and unable to follow commands today as pt unable to properly arouse    Pertinent Vitals/ Pain       Pain Assessment:  Faces Faces Pain Scale: Hurts a little bit(Simultaneous filing. User may not have seen previous data.) Pain Location: Back(Simultaneous filing. User may not have seen previous data.) Pain Descriptors / Indicators: Grimacing(Simultaneous filing. User may not have seen previous data.) Pain Intervention(s): Limited activity within patient's tolerance  Home Living                                          Prior Functioning/Environment              Frequency  Min 3X/week        Progress Toward Goals  OT Goals(current goals can now be found in the care plan section)  Progress towards OT goals: Progressing toward goals  Acute Rehab OT Goals Patient Stated Goal: Pt unable(Simultaneous filing. User may not have seen previous data.) OT Goal Formulation: With patient Time For Goal Achievement: 03/01/19 Potential to Achieve Goals: Good ADL Goals Pt Will Perform Grooming: with set-up;with supervision;sitting Pt Will Perform Upper Body Dressing: with set-up;with supervision;sitting Pt Will Perform Lower Body Dressing: with set-up;with supervision;sit to/from stand Pt Will Transfer to Toilet: with set-up;with supervision;ambulating Additional ADL Goal #1: Pt will peform bed mobility with supervision in preparation for ADLs  Plan Discharge plan remains appropriate    Co-evaluation                 AM-PAC OT "6 Clicks" Daily Activity     Outcome Measure   Help from another person eating meals?: Total Help from another person taking care of personal grooming?: Total Help from another person toileting, which includes using toliet, bedpan, or urinal?: Total Help from another person bathing (including washing, rinsing, drying)?: Total Help from another person to put on and taking off regular upper body clothing?: Total Help from another person to put on and taking off regular lower body clothing?: Total 6 Click Score: 6    End of Session    OT Visit  Diagnosis: Other abnormalities of gait and mobility (R26.89);Unsteadiness on feet (R26.81);Muscle weakness (generalized) (M62.81);Other symptoms and signs involving cognitive function   Activity Tolerance Patient limited by pain;Treatment limited secondary to medical complications (Comment)   Patient Left in bed;with call bell/phone within reach;with bed alarm set   Nurse Communication Mobility status        Time: 3810-1751 OT Time Calculation (min): 15 min  Charges: OT General Charges $OT Visit: 1 Visit OT Treatments $Therapeutic Exercise: 8-22 mins  Darryl Nestle) Marsa Aris OTR/L Acute Rehabilitation Services Pager: [phone] Office: [place] 02/23/2019, 3:53 PM

## 2019-02-24 DIAGNOSIS — A4102 Sepsis due to Methicillin resistant Staphylococcus aureus: Secondary | ICD-10-CM

## 2019-02-24 LAB — PHOSPHORUS: Phosphorus: 3 mg/dL (ref 2.5–4.6)

## 2019-02-24 LAB — CBC
HCT: 22.6 % — ABNORMAL LOW (ref 36.0–46.0)
Hemoglobin: 7.2 g/dL — ABNORMAL LOW (ref 12.0–15.0)
MCH: 29.6 pg (ref 26.0–34.0)
MCHC: 31.9 g/dL (ref 30.0–36.0)
MCV: 93 fL (ref 80.0–100.0)
Platelets: 147 10*3/uL — ABNORMAL LOW (ref 150–400)
RBC: 2.43 MIL/uL — ABNORMAL LOW (ref 3.87–5.11)
RDW: 17.3 % — ABNORMAL HIGH (ref 11.5–15.5)
WBC: 3.7 10*3/uL — ABNORMAL LOW (ref 4.0–10.5)
nRBC: 0 % (ref 0.0–0.2)

## 2019-02-24 LAB — BASIC METABOLIC PANEL
Anion gap: 15 (ref 5–15)
BUN: 71 mg/dL — ABNORMAL HIGH (ref 6–20)
CO2: 15 mmol/L — ABNORMAL LOW (ref 22–32)
Calcium: 7.9 mg/dL — ABNORMAL LOW (ref 8.9–10.3)
Chloride: 116 mmol/L — ABNORMAL HIGH (ref 98–111)
Creatinine, Ser: 1.28 mg/dL — ABNORMAL HIGH (ref 0.44–1.00)
GFR calc Af Amer: >60 mL/min (ref >60)
GFR calc non Af Amer: 56 mL/min — ABNORMAL LOW (ref >60)
Glucose, Bld: 114 mg/dL — ABNORMAL HIGH (ref 70–99)
Potassium: 4 mmol/L (ref 3.5–5.1)
Sodium: 146 mmol/L — ABNORMAL HIGH (ref 135–145)

## 2019-02-24 LAB — GLUCOSE, CAPILLARY
Glucose-Capillary: 100 mg/dL — ABNORMAL HIGH (ref 70–99)
Glucose-Capillary: 117 mg/dL — ABNORMAL HIGH (ref 70–99)
Glucose-Capillary: 122 mg/dL — ABNORMAL HIGH (ref 70–99)
Glucose-Capillary: 123 mg/dL — ABNORMAL HIGH (ref 70–99)

## 2019-02-24 LAB — MAGNESIUM: Magnesium: 1.4 mg/dL — ABNORMAL LOW (ref 1.7–2.4)

## 2019-02-24 MED ORDER — MAGNESIUM SULFATE 2 GM/50ML IV SOLN
2.0000 g | Freq: Once | INTRAVENOUS | Status: AC
  Administered 2019-02-24: 2 g via INTRAVENOUS
  Filled 2019-02-24: qty 50

## 2019-02-24 NOTE — Progress Notes (Signed)
PROGRESS NOTE  Molly [name] MBE:[id number] DOB: 1986-01-05 DOA: 01/20/2019 PCP: Patient, No Pcp Per   LOS: 35 days   Brief narrative: 33 y/o F with a history of IV drug abuse/polysubstance abuse (presented with urine drug screen positive for opiates and amphetamines) and a previous history in June or July 2019 of inadequately treated MRSA endocarditis having signed out AMA at that time.  She had a tricuspid vegetation at that time.  Admitted 4/4 hypotensive and in respiratory distress requiring intubation at [facility] long.  Admitted for acute respiratory failure/septic shock/ AKI due to septic emboli with MSSA bacteremia.  Significant Hospital Events   4/04 Admit with hypotension / resp distress, intubated  4/05 Family discussion > concern terminal based on sepsis, AKI, septic emboli pancytopenia  4/10 UOP improving, off pressors, low grade fevers  4/11 Agitated, PRN versed. Awake / FC. Looks best in many days. Failed SBT 4/12 Failed SBT.  AKI improving, tx PRBC.  Less alert on precedex, methadone  4/13 More alert, FC. On precedex. Failed SBT. ID recommending PICC for antibiotcs > mother not sure about goals of care. S/p 1 unit PRBC   4/17 Trach placement 4/21 tolerating trach collar trials. 4/23 cdiff result positive overnight with continued diarrhea 4/26 seizure 4/28 status epilepticus  Subjective: Complains of back pain.  Feels little anxious.  Assessment/Plan:  Principal Problem:   Sepsis with multiple organ dysfunction (MOD) (HCC) Active Problems:   Intravenous drug abuse, continuous (HCC)   Acute respiratory failure (HCC)   Chronic anemia   Polysubstance abuse ([place])   Encounter for orogastric (OG) tube placement   Acute kidney failure ([place])   MSSA bacteremia   Acute septic pulmonary embolism with acute cor pulmonale (June 2019)   Acute encephalopathy   Thrombocytopenia (HCC)   Sepsis with disseminated intravascular coagulopathy (DIC) ([place])   Sepsis ([place])   Acute  cystitis with hematuria   DIC (disseminated intravascular coagulation) ([place])   Multifocal pneumonia   Goals of care, counseling/discussion   Advanced care planning/counseling discussion   Leukopenia   Pressure injury of skin  New onset seizure 4/26/ Status epilepticus 4/28, PRES. No further seizures reported.  Continue Keppra as per neurology.  Dilantin was stopped on 02/22/2019.  Will need neurology follow-up as outpatient.  Anxiety:  PRN Klonopin,  Essential hypertension Continue on metoprolol.  Closely monitor blood pressure.  MSSA bacteremia with tricuspid valve endocarditis On cefepime and linezolid.  No recent ID follow-up noted.  Will need to clarify on antibiotics.  There is mention of Merrem as per ICU provider but the patient is not on it.  C. difficile colitis Treated  ecoli/enterococcus pna:  on cefepime and Zyvox  Acute resp failure with hypoxia s/p trach Septic pulmonary emboi Ecoli pna  on cefepime and Zyvox, trach management as per PCCM  Severe protein calorie malnutrition Dysphagia Continue tube feeding. Clear liquid  Acute kidney injury We will closely monitor.  Creatinine 0.07  Metabolic acidosis:  Continue bicarbonate tablets  Anemia of chronic disease.  Hemoglobin is 7.2.  No need for transfusion at this time.  No bleeding reported.   Thrombocytopenia: improving  We will continue to monitor  VTE Prophylaxis:  Heparin subq  Code Status: Full code  Family Communication: none  Disposition Plan: CIR/SNF   Consultants: Nephrology Neurology  Procedures: ETT 4/4 >> 4/17 R Femoral line 4/4 >> 4/8 R IJ Tunneled CVC (IR) 4/21 >>  Trach 4/17 >>  Antibiotics: Anti-infectives (From admission, onward)   Start  Dose/Rate Route Frequency Ordered Stop   02/22/19 1015  linezolid (ZYVOX) tablet 600 mg     600 mg Per Tube Every 12 hours 02/22/19 1012     02/22/19 1000  meropenem (MERREM) 2 g in sodium chloride 0.9 % 100 mL IVPB   Status:  Discontinued     2 g 200 mL/hr over 30 Minutes Intravenous Every 12 hours 02/22/19 0853 02/22/19 0934   02/22/19 1000  ceFEPIme (MAXIPIME) 2 g in sodium chloride 0.9 % 100 mL IVPB     2 g 200 mL/hr over 30 Minutes Intravenous Every 12 hours 02/22/19 0935     02/22/19 1000  linezolid (ZYVOX) 100 MG/5ML suspension 600 mg  Status:  Discontinued     600 mg Per Tube Every 12 hours 02/22/19 0938 02/22/19 1008   02/20/19 2200  ceFEPIme (MAXIPIME) 2 g in sodium chloride 0.9 % 100 mL IVPB  Status:  Discontinued     2 g 200 mL/hr over 30 Minutes Intravenous Every 12 hours 02/20/19 1444 02/22/19 0848   02/09/19 1400  ceFAZolin (ANCEF) IVPB 2g/100 mL premix  Status:  Discontinued     2 g 200 mL/hr over 30 Minutes Intravenous Every 8 hours 02/09/19 0902 02/20/19 1444   02/08/19 1000  vancomycin (VANCOCIN) 50 mg/mL oral solution 125 mg  Status:  Discontinued     125 mg Per Tube 4 times daily 02/08/19 0921 02/20/19 0823   02/06/19 1200  fluconazole (DIFLUCAN) tablet 100 mg     100 mg Oral Daily 02/06/19 1146 02/08/19 0956   01/29/19 1000  ceFAZolin (ANCEF) IVPB 2g/100 mL premix  Status:  Discontinued     2 g 200 mL/hr over 30 Minutes Intravenous Every 12 hours 01/28/19 2145 02/09/19 0902   01/28/19 2200  ceFAZolin (ANCEF) IVPB 1 g/50 mL premix     1 g 100 mL/hr over 30 Minutes Intravenous  Once 01/28/19 2145 01/28/19 2248   01/28/19 2130  ceFAZolin (ANCEF) IVPB 2g/100 mL premix  Status:  Discontinued     2 g 200 mL/hr over 30 Minutes Intravenous Every 12 hours 01/28/19 2121 01/28/19 2145   01/20/19 2000  ceFAZolin (ANCEF) IVPB 1 g/50 mL premix  Status:  Discontinued     1 g 100 mL/hr over 30 Minutes Intravenous Every 24 hours 01/20/19 1831 01/28/19 2121   01/20/19 1400  piperacillin-tazobactam (ZOSYN) IVPB 2.25 g  Status:  Discontinued    Note to Pharmacy:  Pharmacy to dose   2.25 g 66.7 mL/hr over 30 Minutes Intravenous Every 8 hours 01/20/19 1234 01/20/19 1239   01/20/19 1400   piperacillin-tazobactam (ZOSYN) IVPB 2.25 g  Status:  Discontinued     2.25 g 100 mL/hr over 30 Minutes Intravenous Every 8 hours 01/20/19 1239 01/20/19 1831   01/20/19 0545  ceftaroline (TEFLARO) injection SOLR 300 mg  Status:  Discontinued     300 mg Intravenous  Once 01/20/19 0534 01/20/19 0535   01/20/19 0545  ceftaroline (TEFLARO) 300 mg in sodium chloride 0.9 % 250 mL IVPB     300 mg 250 mL/hr over 60 Minutes Intravenous  Once 01/20/19 0539 01/20/19 0727   01/20/19 0430  vancomycin (VANCOCIN) IVPB 1000 mg/200 mL premix  Status:  Discontinued     1,000 mg 200 mL/hr over 60 Minutes Intravenous  Once 01/20/19 0348 01/20/19 0519   01/20/19 0400  piperacillin-tazobactam (ZOSYN) IVPB 3.375 g     3.375 g 100 mL/hr over 30 Minutes Intravenous  Once 01/20/19 0348 01/20/19 0423  Objective: Vitals:   02/24/19 1144 02/24/19 1154  BP:    Pulse:  100  Resp:  (!) 30  Temp: 98.5 F (36.9 C)   SpO2:  100%    Intake/Output Summary (Last 24 hours) at 02/24/2019 1242 Last data filed at 02/24/2019 1000 Gross per 24 hour  Intake 1202.25 ml  Output 600 ml  Net 602.25 ml   Filed Weights   02/22/19 0411 02/23/19 0500 02/24/19 0500  Weight: 56.8 kg 54.7 kg 55 kg   Body mass index is 22.91 kg/m.   Physical Exam: GENERAL: Patient is alert awake and oriented.  Mildly anxious.  Frail and thinly built. HENT: No scleral pallor or icterus. Pupils equally reactive to light. Oral mucosa is moist.  Trach collar in place NG tube in place NECK: is supple, no palpable thyroid enlargement.  Right sided a CBC catheter. CHEST: Coarse breath sounds noted..  Diminished breath sounds bilaterally. CVS: S1 and S2 heard, no murmur. Regular rate and rhythm. No pericardial rub. ABDOMEN: Soft, non-tender, bowel sounds are present.  PEG tube in place. No palpable hepato-splenomegaly.  Sternal urinary catheter. EXTREMITIES: No edema. CNS: Cranial nerves are intact. No focal motor or sensory deficits. SKIN: warm  and dry   Data Review: I have personally reviewed the following laboratory data and studies,  CBC: Recent Labs  Lab 02/19/19 0514 02/19/19 1700 02/20/19 0511 02/21/19 0352 02/22/19 0420 02/24/19 0511  WBC 3.4* 5.4 3.8* 3.4* 3.9* 3.7*  NEUTROABS 2.3  --  2.8 2.2 2.6  --   HGB 6.4* 8.1* 7.7* 7.0* 7.1* 7.2*  HCT 21.4* 26.8* 24.8* 22.5* 22.7* 22.6*  MCV 94.3 93.4 91.5 92.2 92.3 93.0  PLT 86* 101* 101* 101* 125* 836*   Basic Metabolic Panel: Recent Labs  Lab 02/18/19 0409 02/19/19 0514 02/20/19 0511 02/21/19 0352 02/22/19 0420 02/24/19 0511  NA  --  144 142 144 144 146*  K  --  3.4* 3.4* 3.7 3.7 4.0  CL  --  115* 114* 112* 115* 116*  CO2  --  15* 14* 16* 15* 15*  GLUCOSE  --  120* 110* 113* 123* 114*  BUN  --  74* 70* 77* 78* 71*  CREATININE  --  1.67* 1.58* 1.50* 1.39* 1.28*  CALCIUM  --  7.4* 7.6* 7.8* 7.7* 7.9*  MG 1.9  --   --   --   --  1.4*  PHOS 2.6  --   --   --   --  3.0   Liver Function Tests: Recent Labs  Lab 02/18/19 0409  ALBUMIN 1.2*   No results for input(s): LIPASE, AMYLASE in the last 168 hours. No results for input(s): AMMONIA in the last 168 hours. Cardiac Enzymes: No results for input(s): CKTOTAL, CKMB, CKMBINDEX, TROPONINI in the last 168 hours. BNP (last 3 results) Recent Labs    04/07/18 2200 04/11/18 0900 04/22/18 1449  BNP 75.2 518.4* 357.0*    ProBNP (last 3 results) No results for input(s): PROBNP in the last 8760 hours.  CBG: Recent Labs  Lab 02/23/19 1536 02/23/19 1940 02/24/19 0034 02/24/19 0511 02/24/19 1139  GLUCAP 104* 118* 122* 100* 117*   Recent Results (from the past 240 hour(s))  Culture, respiratory (non-expectorated)     Status: None   Collection Time: 02/18/19 11:38 AM  Result Value Ref Range Status   Specimen Description TRACHEAL ASPIRATE  Final   Special Requests Normal  Final   Gram Stain   Final    ABUNDANT WBC PRESENT,  PREDOMINANTLY PMN RARE SQUAMOUS EPITHELIAL CELLS PRESENT RARE GRAM NEGATIVE  RODS Performed at [facility], [place] [address] [place], [place] [address]    Culture MODERATE ESCHERICHIA COLI  Final   Report Status 02/20/2019 FINAL  Final   Organism ID, Bacteria ESCHERICHIA COLI  Final      Susceptibility   Escherichia coli - MIC*    AMPICILLIN >=32 RESISTANT Resistant     CEFAZOLIN >=64 RESISTANT Resistant     CEFEPIME <=1 SENSITIVE Sensitive     CEFTAZIDIME <=1 SENSITIVE Sensitive     CEFTRIAXONE <=1 SENSITIVE Sensitive     CIPROFLOXACIN >=4 RESISTANT Resistant     GENTAMICIN <=1 SENSITIVE Sensitive     IMIPENEM <=0.25 SENSITIVE Sensitive     TRIMETH/SULFA >=320 RESISTANT Resistant     AMPICILLIN/SULBACTAM >=32 RESISTANT Resistant     PIP/TAZO >=128 RESISTANT Resistant     * MODERATE ESCHERICHIA COLI  Culture, respiratory (non-expectorated)     Status: None   Collection Time: 02/19/19 11:08 AM  Result Value Ref Range Status   Specimen Description TRACHEAL ASPIRATE  Final   Special Requests Normal  Final   Gram Stain   Final    MODERATE WBC PRESENT, PREDOMINANTLY PMN RARE GRAM POSITIVE COCCI Performed at [facility], [address], [place], [place] [address]    Culture   Final    FEW ESCHERICHIA COLI FEW VANCOMYCIN RESISTANT ENTEROCOCCUS    Report Status 02/22/2019 FINAL  Final   Organism ID, Bacteria ESCHERICHIA COLI  Final   Organism ID, Bacteria VANCOMYCIN RESISTANT ENTEROCOCCUS  Final      Susceptibility   Escherichia coli - MIC*    AMPICILLIN >=32 RESISTANT Resistant     CEFAZOLIN >=64 RESISTANT Resistant     CEFEPIME <=1 SENSITIVE Sensitive     CEFTAZIDIME <=1 SENSITIVE Sensitive     CEFTRIAXONE <=1 SENSITIVE Sensitive     CIPROFLOXACIN >=4 RESISTANT Resistant     GENTAMICIN <=1 SENSITIVE Sensitive     IMIPENEM <=0.25 SENSITIVE Sensitive     TRIMETH/SULFA >=320 RESISTANT Resistant     AMPICILLIN/SULBACTAM >=32 RESISTANT Resistant     PIP/TAZO >=128 RESISTANT Resistant     Extended ESBL NEGATIVE Sensitive     * FEW  ESCHERICHIA COLI   Vancomycin resistant enterococcus - MIC*    AMPICILLIN >=32 RESISTANT Resistant     VANCOMYCIN >=32 RESISTANT Resistant     GENTAMICIN SYNERGY SENSITIVE Sensitive     LINEZOLID 2 SENSITIVE Sensitive     * FEW VANCOMYCIN RESISTANT ENTEROCOCCUS  MRSA PCR Screening     Status: None   Collection Time: 02/22/19 11:26 AM  Result Value Ref Range Status   MRSA by PCR NEGATIVE NEGATIVE Final    Comment:        The GeneXpert MRSA Assay (FDA approved for NASAL specimens only), is one component of a comprehensive MRSA colonization surveillance program. It is not intended to diagnose MRSA infection nor to guide or monitor treatment for MRSA infections. Performed at [facility], [place] [address], [place], [place] [address]      Studies: No results found.  Scheduled Meds:  acetaminophen  650 mg Per Tube Q6H   chlorhexidine gluconate (MEDLINE KIT)  15 mL Mouth Rinse BID   Chlorhexidine Gluconate Cloth  6 each Topical Daily   feeding supplement (PRO-STAT SUGAR FREE 64)  30 mL Oral Daily   fentaNYL  1 patch Transdermal Q72H   free water  100 mL Per Tube  Q4H   heparin injection (subcutaneous)  5,000 Units Subcutaneous Q12H   levETIRAcetam  1,000 mg Per Tube Q12H   linezolid  600 mg Per Tube Q12H   mouth rinse  15 mL Mouth Rinse 10 times per day   metoprolol tartrate  12.5 mg Per Tube BID   multivitamin  15 mL Oral Daily   nutrition supplement (JUVEN)  1 packet Per Tube BID BM   pantoprazole sodium  40 mg Per Tube Daily   sodium bicarbonate  650 mg Oral TID   sodium chloride flush  10-40 mL Intracatheter Q12H   vitamin C  250 mg Per NG tube BID    Continuous Infusions:  sodium chloride Stopped (02/24/19 0934)   ceFEPime (MAXIPIME) IV 2 g (02/24/19 0934)   feeding supplement (JEVITY 1.2 CAL) 1,000 mL (02/24/19 0640)     Flora Lipps, MD  [facility] 02/24/2019

## 2019-02-24 NOTE — Progress Notes (Signed)
Per CCM NP Minor vent was removed from pt room with vent order still in place. RT will continue to monitor.

## 2019-02-24 NOTE — Progress Notes (Signed)
NAME:  Molly [name], MRN:  [id number], DOB:  08-01-1986, LOS: 58 ADMISSION DATE:  01/20/2019, CONSULTATION DATE: 01/20/2019 REFERRING MD:  Molly [name], CHIEF COMPLAINT:  Sepsis  Brief History   33 y/o F with a history of IV drug abuse/polysubstance abuse (presented with urine drug screen positive for opiates and amphetamines) and a previous history in June or July 2019 of inadequately treated MRSA endocarditis having signed out AMA at that time.  She had a tricuspid vegetation at that time.  Admitted 4/4 hypotensive and in respiratory distress requiring intubation at [facility].   Admitted for acute respiratory failure/septic shock/ AKI due to septic emboli with MSSA bacteremia.  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 Awake / FC. Looks best in many days. Failed SBT 4/12 Failed SBT.  AKI improving, tx PRBC.  Less alert on precedex, methadone  4/13 More alert, FC. On precedex. Failed SBT. ID recommending PICC for antibiotcs > mother not sure about goals of care. S/p 1 unit PRBC   4/17 Trach placement 4/21 tolerating trach collar trials. 4/23 cdiff result positive overnight with continued diarrhea 4/26 seizure 4/28 status epilepticus  Consults:  Nephrology Neurology  Procedures:  ETT 4/4 >> 4/17 R Femoral line 4/4 >> 4/8 R IJ Tunneled CVC (IR) 4/21 >>  Trach 4/17 >>  Significant Diagnostic Tests:  Echocardiogram 4/4 >> TV vegetation measures 1.5 mm x 0.6 mm., severe TR CT Chest 4/4 >> bilateral  pulmonary nodules, peripheral and cavitated.  LE venous duplex 4/20 >> negative MRI brain 4/26 >> scattered T2/FLAIR most in parieto-occipital region b/l EEG 4/27 >> no evidence of seizures   Micro Data:  Blood cultures x2 4/4 >> MSSA  BCx2 4/8 >> negative   C diff 4/22 >> antigen positive, toxin negative, PCR positive 4/28 urine cx: neg 5/4 trach cx: ecoli, resistant to ancef/ enterococcus  Antimicrobials:  Vancomycin Zosyn Teflaro >> stopped Ancef 4/4 >> HELD TO START CEFEPIME IN LIGHT OF ECOLI (resistent to ancef) but now new enterococcus req merrem Tx'ing for 7 DAYS THEN RESUME FOR END DATE OF 5/19 Diflucan 4/21 >> 4/24 Vancomycin po 4/23 >> 5/5   Cefepime 5/5->5/7  Merrem 5/7-5/14:THEN RESUME ANCEF TO 5/19   Interim history/subjective:  Remains off ventilator x48 hours.  Pulmonary critical care will continue to see twice a week for trach management.  Objective   Blood pressure (!) 127/99, pulse (!) 105, temperature 98 F (36.7 C), temperature source Oral, resp. rate (!) 28, height 5\' 1"  (1.549 m), weight 55 kg, SpO2 100 %.    FiO2 (%):  [28 %] 28 %   Intake/Output Summary (Last 24 hours) at 02/24/2019 0845 Last data filed at 02/23/2019 2300 Gross per 24 hour  Intake 1302.32 ml  Output 600 ml  Net 702.32 ml   Filed Weights   02/22/19 0411 02/23/19 0500 02/24/19 0500  Weight: 56.8 kg 54.7 kg 55 kg   General: Thin frail female who is been on trach collar x48 hours HEENT: #6 cuffed trach replaced on 05/2019 Neuro: Somewhat somnolent but follows  simple commands CV: s1s2 rrr, no m/r/g PULM: even/non-labored, lungs bilaterally shallow respirations, but adequate air movement ST:MHDQ, non-tender, bsx4 active  Extremities: warm/dry, negative edema  Skin: no rashes or lesions     Resolved Hospital Problem [name]   Septic Shock, thrush  Active Problem [name]    New onset seizure 4/26 Status epilepticus 4/28 Continue Keppra per neurology's instructions Dilantin stopped on  02/22/2019 per neurology's input Appreciate neurology's input no further seizure activity has been noted.   Anxiety:  PRN Klonopin Molly [name]    Hypertension Lopressor PRN Labetalol 10 systolic blood pressure less than 140 per neurology    MSSA bacteremia with tricuspid valve endocarditis -Continue Ancef per infectious disease until 03/06/2019 (held due to resistance patterns of ecoli) -Restart once merrem course complete on 5/14 (may be able to de-escalate based on sensitivities) C. difficile colitis -s/p tx ecoli/enterococcus pna:   Continue cefepime and Zyvox   Acute resp failure with hypoxia s/p trach Septic pulmonary emboi ecoli pna Currently 48 hours off ventilator on T collar Changed to a 6 cuffed trach on 02/24/2019.  Continue to utilize cuff due to her tendency towards repeated respiratory failure.  In the past she has copious secretions. 02/19/2019 sputum culture with E. coli on cefepime and Zyvox t collar as tolerated   Severe protein calorie malnutrition Dysphagia Continue continue tube feeding due to severe malnourishment Continue dysphagia diet in the setting of malnourishment   AKI:  Lab Results  Component Value Date   CREATININE 1.28 (H) 02/24/2019   CREATININE 1.39 (H) 02/22/2019   CREATININE 1.50 (H) 02/21/2019   CREATININE 0.97 11/02/2016   CREATININE 0.79 08/24/2016   Recent Labs  Lab 02/21/19 0352 02/22/19 0420 02/24/19 0511  K 3.7 3.7 4.0    Monitor creatine  Hypokalemia Monitor replete as needed  Metabolic acidosis:  Continue bicarbonate tablets   Anemia of chronic illness Recent Labs    02/22/19 0420 02/24/19 0511  HGB 7.1* 7.2*    Transfuse per protocol   Thrombocytopenia: improving 101->125 Leukopenia 3.5->3.9 Continue to monitor     Best practice:  Diet: tube feeds and PO nectar thick liquids DVT prophylaxis: SQ heparin GI prophylaxis: protonix Mobility: oob with assistance Code Status: Limited code Disposition:  ICU, 02/24/2019.  Remains in intensive care unit.  Mainly to the fact she has a cuffed trach at this time.  Ventilator is being removed from the room.  She is currently on triads hospitalist service.  We will see again on Monday, 02/26/2019 Labs:   CMP Latest Ref Rng & Units 02/24/2019 02/22/2019 02/21/2019  Glucose 70 - 99 mg/dL 114(H) 123(H) 113(H)  BUN 6 - 20 mg/dL 71(H) 78(H) 77(H)  Creatinine 0.44 - 1.00 mg/dL 1.28(H) 1.39(H) 1.50(H)  Sodium 135 - 145 mmol/L 146(H) 144 144  Potassium 3.5 - 5.1 mmol/L 4.0 3.7 3.7  Chloride 98 - 111 mmol/L 116(H) 115(H) 112(H)  CO2 22 - 32 mmol/L 15(L) 15(L) 16(L)  Calcium 8.9 - 10.3 mg/dL 7.9(L) 7.7(L) 7.8(L)  Total Protein 6.5 - 8.1 g/dL - - -  Total Bilirubin 0.3 - 1.2 mg/dL - - -  Alkaline Phos 38 - 126 U/L - - -  AST 15 - 41 U/L - - -  ALT 0 - 44 U/L - - -   CBC Latest Ref Rng & Units 02/24/2019 02/22/2019 02/21/2019  WBC 4.0 - 10.5 K/uL 3.7(L) 3.9(L) 3.4(L)  Hemoglobin 12.0 - 15.0 g/dL 7.2(L) 7.1(L) 7.0(L)  Hematocrit 36.0 - 46.0 % 22.6(L) 22.7(L) 22.5(L)  Platelets 150 - 400 K/uL 147(L) 125(L) 101(L)   ABG    Component Value Date/Time   PHART 7.439 02/13/2019 0941   PCO2ART 30.5 (L) 02/13/2019 0941   PO2ART 115.0 (H) 02/13/2019 0941   HCO3 20.7 02/13/2019 0941   TCO2 22 02/13/2019 0941   ACIDBASEDEF 3.0 (H) 02/13/2019 0941   O2SAT 99.0 02/13/2019 0941   CBG (last 3)   Critical care time: App CCT 30 min    Richardson Landry Marzell Allemand ACNP Maryanna Shape PCCM Pager [phone] till 1 pm If no answer page [phone] 02/24/2019, 8:45 AM

## 2019-02-24 NOTE — Progress Notes (Signed)
Patient on 28% ATC, Vent on standby

## 2019-02-25 DIAGNOSIS — I2690 Septic pulmonary embolism without acute cor pulmonale: Secondary | ICD-10-CM

## 2019-02-25 LAB — BASIC METABOLIC PANEL
Anion gap: 9 (ref 5–15)
BUN: 65 mg/dL — ABNORMAL HIGH (ref 6–20)
CO2: 17 mmol/L — ABNORMAL LOW (ref 22–32)
Calcium: 7.9 mg/dL — ABNORMAL LOW (ref 8.9–10.3)
Chloride: 119 mmol/L — ABNORMAL HIGH (ref 98–111)
Creatinine, Ser: 1.23 mg/dL — ABNORMAL HIGH (ref 0.44–1.00)
GFR calc Af Amer: >60 mL/min (ref >60)
GFR calc non Af Amer: 58 mL/min — ABNORMAL LOW (ref >60)
Glucose, Bld: 113 mg/dL — ABNORMAL HIGH (ref 70–99)
Potassium: 4.2 mmol/L (ref 3.5–5.1)
Sodium: 145 mmol/L (ref 135–145)

## 2019-02-25 LAB — CBC
HCT: 22.4 % — ABNORMAL LOW (ref 36.0–46.0)
Hemoglobin: 7.1 g/dL — ABNORMAL LOW (ref 12.0–15.0)
MCH: 29.8 pg (ref 26.0–34.0)
MCHC: 31.7 g/dL (ref 30.0–36.0)
MCV: 94.1 fL (ref 80.0–100.0)
Platelets: 166 10*3/uL (ref 150–400)
RBC: 2.38 MIL/uL — ABNORMAL LOW (ref 3.87–5.11)
RDW: 17.9 % — ABNORMAL HIGH (ref 11.5–15.5)
WBC: 5.1 10*3/uL (ref 4.0–10.5)
nRBC: 0 % (ref 0.0–0.2)

## 2019-02-25 LAB — GLUCOSE, CAPILLARY
Glucose-Capillary: 94 mg/dL (ref 70–99)
Glucose-Capillary: 100 mg/dL — ABNORMAL HIGH (ref 70–99)

## 2019-02-25 LAB — MAGNESIUM: Magnesium: 1.7 mg/dL (ref 1.7–2.4)

## 2019-02-25 MED ORDER — ORAL CARE MOUTH RINSE
15.0000 mL | Freq: Two times a day (BID) | OROMUCOSAL | Status: DC
  Administered 2019-02-26 – 2019-03-01 (×6): 15 mL via OROMUCOSAL

## 2019-02-25 MED ORDER — CHLORHEXIDINE GLUCONATE 0.12 % MT SOLN: 15.0000 mL | Freq: Two times a day (BID) | OROMUCOSAL | Status: DC

## 2019-02-25 MED ORDER — CHLORHEXIDINE GLUCONATE 0.12 % MT SOLN
15.0000 mL | Freq: Two times a day (BID) | OROMUCOSAL | Status: DC
  Administered 2019-02-26 – 2019-03-01 (×7): 15 mL via OROMUCOSAL
  Filled 2019-02-25 (×4): qty 15

## 2019-02-25 MED ORDER — ORAL CARE MOUTH RINSE: 15.0000 mL | Freq: Two times a day (BID) | OROMUCOSAL | Status: DC

## 2019-02-25 NOTE — Progress Notes (Signed)
Pharmacy Antibiotic Note  Molly [name] is a 33 y.o. female with MSSA  bacteremia/endocarditis and also has e.coli growing and VRE in trach aspirate culture.    Patient on cefepime to cover secondary e.coli pneumonia infection and on zyvox for VRE PNA. Plans are for cefepime until 5/12 to cover for ecoli then switch to cefazolin through 5/19  Plan: Cefepime IV 2g Q12H x7 days Then resume cefazolin through 5/19  Will follow length of therapy for Zyvox Monitor Cxs, clinical status, renal function  Height: 5\' 1"  (154.9 cm) Weight: 119 lb 0.8 oz (54 kg) IBW/kg (Calculated) : 47.8  Temp (24hrs), Avg:98.4 F (36.9 C), Min:97.7 F (36.5 C), Max:98.8 F (37.1 C)  Recent Labs  Lab 02/20/19 0511 02/21/19 0352 02/22/19 0420 02/24/19 0511 02/25/19 0323  WBC 3.8* 3.4* 3.9* 3.7* 5.1  CREATININE 1.58* 1.50* 1.39* 1.28* 1.23*    Estimated Creatinine Clearance: 50 mL/min (A) (by C-G formula based on SCr of 1.23 mg/dL (H)).    Allergies  Allergen Reactions  . Lactose Intolerance (Gi)   . Vancomycin Other (See Comments)    Possible contributor to AKI and thrombocytopenia    Antimicrobials this admission: 5/7 Linezolid >> 5/5 cefepime >> (5/12) 4/23 Vanc po>> 5/5 4/21 fluconazole (thrush) >> 4/23 4/4 cefazolin >>5/5; 5/13 >>(5/19) 4/4 zosyn x1 4/4 vancomycin x1 4/4 ceftaroline x1  Microbiology results: 5/4 TA - Ecoli (sens to cefepime), VRE 5/3 TA - e.coli (R to ancef) 4/28 UC - neg 4/22 cdiff: positive  4/8 BCx - negative 4/4 Bcx: MSSA 4/4 Ucx: MSSA   Thank you for allowing pharmacy to be a part of this patient's care.  Hildred Laser, PharmD Clinical Pharmacist **Pharmacist phone directory can now be found on [email or web address] ([email or web address]).  Listed under [place].

## 2019-02-25 NOTE — Progress Notes (Signed)
Video call completed with mother, Jan.

## 2019-02-25 NOTE — Progress Notes (Addendum)
PROGRESS NOTE  Molly [name] DXI:[id number] DOB: September 27, 1986 DOA: 01/20/2019 PCP: Patient, No Pcp Per   LOS: 36 days   Brief narrative: 33 y/o F with a history of IV drug abuse/polysubstance abuse (presented with urine drug screen positive for opiates and amphetamines) and a previous history in June or July 2019 of inadequately treated MRSA endocarditis having signed out AMA at that time.  She had a tricuspid vegetation at that time.  Admitted 4/4 hypotensive and in respiratory distress requiring intubation at [facility].  Admitted for acute respiratory failure/septic shock/ AKI due to septic emboli with MSSA bacteremia.  Significant Hospital Events    4/04 Admit with hypotension / resp distress, intubated  4/05 Family discussion > concern terminal based on sepsis, AKI, septic emboli pancytopenia  4/10 UOP improving, off pressors, low grade fevers  4/11 Agitated, PRN versed. Awake / FC. Looks best in many days. Failed SBT 4/12 Failed SBT.  AKI improving, tx PRBC.  Less alert on precedex, methadone  4/13 More alert, FC. On precedex. Failed SBT. ID recommending PICC for antibiotcs > mother not sure about goals of care. S/p 1 unit PRBC   4/17 Trach placement 4/21 tolerating trach collar trials. 4/23 cdiff result positive overnight with continued diarrhea 4/26 seizure 4/28 status epilepticus  Subjective: Patient was a little sleepy when I saw her this morning.  Complains of mild back pain.  Assessment/Plan:  Principal Problem:   Sepsis with multiple organ dysfunction (MOD) (HCC) Active Problems:   Intravenous drug abuse, continuous (HCC)   Acute respiratory failure (HCC)   Chronic anemia   Polysubstance abuse ([place])   Encounter for orogastric (OG) tube placement   Acute kidney failure ([place])   MSSA bacteremia   Acute septic pulmonary embolism with acute cor pulmonale (June 2019)   Acute encephalopathy   Thrombocytopenia (HCC)   Sepsis with disseminated intravascular coagulopathy  (DIC) ([place])   Sepsis ([place])   Acute cystitis with hematuria   DIC (disseminated intravascular coagulation) ([place])   Multifocal pneumonia   Goals of care, counseling/discussion   Advanced care planning/counseling discussion   Leukopenia   Pressure injury of skin  New onset seizure 4/26/ Status epilepticus 4/28, PRES. No further seizures reported.  Continue Keppra as per neurology.  Dilantin was stopped on 02/22/2019.  Will need neurology follow-up as outpatient.  Anxiety:  PRN Klonopin,  Essential hypertension Continue on metoprolol.  Closely monitor blood pressure.  MSSA bacteremia with tricuspid valve endocarditis On cefepime and linezolid.  Spoke with the ICU attending.  I also spoke with Dr. Graylon Good regarding consultation on this patient due to prolonged antibiotic need.  As per discussion we will transition to Ancef after completion of linezolid.  C. difficile colitis Treated  ecoli/enterococcus pna:  on cefepime and Zyvox  Acute resp failure with hypoxia s/p trach Septic pulmonary emboi Ecoli pna  on cefepime and Zyvox, trach management as per PCCM  Severe protein calorie malnutrition/Dysphagia Continue tube feeding. Clear liquid  Acute kidney injury We will closely monitor.  Creatinine 7.67  Metabolic acidosis:  Continue bicarbonate tablets  Anemia of chronic disease.  Hemoglobin is 7.2.  No need for transfusion at this time.  No bleeding reported.   Thrombocytopenia: improving  We will continue to monitor  VTE Prophylaxis:  Heparin subq  Code Status: Full code  Family Communication: I also spoke with the patient's mother on the phone and updated her about the clinical condition of the patient. She states that she is in the process of insurance  and working with Education officer, museum.   Disposition Plan: CIR/SNF  Consultants: Nephrology Neurology  Procedures: ETT 4/4 >> 4/17 R Femoral line 4/4 >> 4/8 R IJ Tunneled CVC (IR) 4/21 >>  Trach 4/17 >>   Antibiotics: Anti-infectives (From admission, onward)   Start     Dose/Rate Route Frequency Ordered Stop   02/22/19 1015  linezolid (ZYVOX) tablet 600 mg     600 mg Per Tube Every 12 hours 02/22/19 1012     02/22/19 1000  meropenem (MERREM) 2 g in sodium chloride 0.9 % 100 mL IVPB  Status:  Discontinued     2 g 200 mL/hr over 30 Minutes Intravenous Every 12 hours 02/22/19 0853 02/22/19 0934   02/22/19 1000  ceFEPIme (MAXIPIME) 2 g in sodium chloride 0.9 % 100 mL IVPB     2 g 200 mL/hr over 30 Minutes Intravenous Every 12 hours 02/22/19 0935     02/22/19 1000  linezolid (ZYVOX) 100 MG/5ML suspension 600 mg  Status:  Discontinued     600 mg Per Tube Every 12 hours 02/22/19 0938 02/22/19 1008   02/20/19 2200  ceFEPIme (MAXIPIME) 2 g in sodium chloride 0.9 % 100 mL IVPB  Status:  Discontinued     2 g 200 mL/hr over 30 Minutes Intravenous Every 12 hours 02/20/19 1444 02/22/19 0848   02/09/19 1400  ceFAZolin (ANCEF) IVPB 2g/100 mL premix  Status:  Discontinued     2 g 200 mL/hr over 30 Minutes Intravenous Every 8 hours 02/09/19 0902 02/20/19 1444   02/08/19 1000  vancomycin (VANCOCIN) 50 mg/mL oral solution 125 mg  Status:  Discontinued     125 mg Per Tube 4 times daily 02/08/19 0921 02/20/19 0823   02/06/19 1200  fluconazole (DIFLUCAN) tablet 100 mg     100 mg Oral Daily 02/06/19 1146 02/08/19 0956   01/29/19 1000  ceFAZolin (ANCEF) IVPB 2g/100 mL premix  Status:  Discontinued     2 g 200 mL/hr over 30 Minutes Intravenous Every 12 hours 01/28/19 2145 02/09/19 0902   01/28/19 2200  ceFAZolin (ANCEF) IVPB 1 g/50 mL premix     1 g 100 mL/hr over 30 Minutes Intravenous  Once 01/28/19 2145 01/28/19 2248   01/28/19 2130  ceFAZolin (ANCEF) IVPB 2g/100 mL premix  Status:  Discontinued     2 g 200 mL/hr over 30 Minutes Intravenous Every 12 hours 01/28/19 2121 01/28/19 2145   01/20/19 2000  ceFAZolin (ANCEF) IVPB 1 g/50 mL premix  Status:  Discontinued     1 g 100 mL/hr over 30 Minutes  Intravenous Every 24 hours 01/20/19 1831 01/28/19 2121   01/20/19 1400  piperacillin-tazobactam (ZOSYN) IVPB 2.25 g  Status:  Discontinued    Note to Pharmacy:  Pharmacy to dose   2.25 g 66.7 mL/hr over 30 Minutes Intravenous Every 8 hours 01/20/19 1234 01/20/19 1239   01/20/19 1400  piperacillin-tazobactam (ZOSYN) IVPB 2.25 g  Status:  Discontinued     2.25 g 100 mL/hr over 30 Minutes Intravenous Every 8 hours 01/20/19 1239 01/20/19 1831   01/20/19 0545  ceftaroline (TEFLARO) injection SOLR 300 mg  Status:  Discontinued     300 mg Intravenous  Once 01/20/19 0534 01/20/19 0535   01/20/19 0545  ceftaroline (TEFLARO) 300 mg in sodium chloride 0.9 % 250 mL IVPB     300 mg 250 mL/hr over 60 Minutes Intravenous  Once 01/20/19 0539 01/20/19 0727   01/20/19 0430  vancomycin (VANCOCIN) IVPB 1000 mg/200 mL premix  Status:  Discontinued     1,000 mg 200 mL/hr over 60 Minutes Intravenous  Once 01/20/19 0348 01/20/19 0519   01/20/19 0400  piperacillin-tazobactam (ZOSYN) IVPB 3.375 g     3.375 g 100 mL/hr over 30 Minutes Intravenous  Once 01/20/19 0348 01/20/19 0423     Objective: Vitals:   02/25/19 1122 02/25/19 1200  BP:  (!) 120/98  Pulse: (!) 103 (!) 104  Resp: (!) 29 (!) 27  Temp:    SpO2: 100% 100%    Intake/Output Summary (Last 24 hours) at 02/25/2019 1236 Last data filed at 02/25/2019 1200 Gross per 24 hour  Intake 1446.05 ml  Output 1850 ml  Net -403.95 ml   Filed Weights   02/23/19 0500 02/24/19 0500 02/25/19 0343  Weight: 54.7 kg 55 kg 54 kg   Body mass index is 22.49 kg/m.   Physical Exam: General: Thinly built.  Alert awake oriented.  Mildly anxious. HENT: Normocephalic, pupils equally reacting to light and accommodation.  No scleral pallor or icterus noted. Oral mucosa is moist.  Right-sided CVC catheter.  NG tube in place.  Trach collar in place. Chest:  Clear breath sounds.  Diminished breath sounds bilaterally. No crackles or wheezes.  CVS: S1 &S2 heard. No murmur.   Regular rate and rhythm. Abdomen: Soft, nontender, nondistended.  Bowel sounds are heard.  Liver is not palpable, no abdominal mass palpated.  Genitourinary catheter Extremities: No cyanosis, clubbing or edema.  Peripheral pulses are palpable. Psych: Alert, awake and oriented, normal mood CNS:  No cranial nerve deficits.  Power equal in all extremities.  No sensory deficits noted.  No cerebellar signs.   Skin: Warm and dry.    Data Review: I have personally reviewed the following laboratory data and studies,  CBC: Recent Labs  Lab 02/19/19 0514  02/20/19 0511 02/21/19 0352 02/22/19 0420 02/24/19 0511 02/25/19 0323  WBC 3.4*   < > 3.8* 3.4* 3.9* 3.7* 5.1  NEUTROABS 2.3  --  2.8 2.2 2.6  --   --   HGB 6.4*   < > 7.7* 7.0* 7.1* 7.2* 7.1*  HCT 21.4*   < > 24.8* 22.5* 22.7* 22.6* 22.4*  MCV 94.3   < > 91.5 92.2 92.3 93.0 94.1  PLT 86*   < > 101* 101* 125* 147* 166   < > = values in this interval not displayed.   Basic Metabolic Panel: Recent Labs  Lab 02/20/19 0511 02/21/19 0352 02/22/19 0420 02/24/19 0511 02/25/19 0323  NA 142 144 144 146* 145  K 3.4* 3.7 3.7 4.0 4.2  CL 114* 112* 115* 116* 119*  CO2 14* 16* 15* 15* 17*  GLUCOSE 110* 113* 123* 114* 113*  BUN 70* 77* 78* 71* 65*  CREATININE 1.58* 1.50* 1.39* 1.28* 1.23*  CALCIUM 7.6* 7.8* 7.7* 7.9* 7.9*  MG  --   --   --  1.4* 1.7  PHOS  --   --   --  3.0  --    Liver Function Tests: No results for input(s): AST, ALT, ALKPHOS, BILITOT, PROT, ALBUMIN in the last 168 hours. No results for input(s): LIPASE, AMYLASE in the last 168 hours. No results for input(s): AMMONIA in the last 168 hours. Cardiac Enzymes: No results for input(s): CKTOTAL, CKMB, CKMBINDEX, TROPONINI in the last 168 hours. BNP (last 3 results) Recent Labs    04/07/18 2200 04/11/18 0900 04/22/18 1449  BNP 75.2 518.4* 357.0*    ProBNP (last 3 results) No results for input(s): PROBNP in  the last 8760 hours.  CBG: Recent Labs  Lab 02/24/19 0034  02/24/19 0511 02/24/19 1139 02/24/19 2324 02/25/19 0746  GLUCAP 122* 100* 117* 123* 94   Recent Results (from the past 240 hour(s))  Culture, respiratory (non-expectorated)     Status: None   Collection Time: 02/18/19 11:38 AM  Result Value Ref Range Status   Specimen Description TRACHEAL ASPIRATE  Final   Special Requests Normal  Final   Gram Stain   Final    ABUNDANT WBC PRESENT, PREDOMINANTLY PMN RARE SQUAMOUS EPITHELIAL CELLS PRESENT RARE GRAM NEGATIVE RODS Performed at [facility], [place] [address], [place], [place] [address]    Culture MODERATE ESCHERICHIA COLI  Final   Report Status 02/20/2019 FINAL  Final   Organism ID, Bacteria ESCHERICHIA COLI  Final      Susceptibility   Escherichia coli - MIC*    AMPICILLIN >=32 RESISTANT Resistant     CEFAZOLIN >=64 RESISTANT Resistant     CEFEPIME <=1 SENSITIVE Sensitive     CEFTAZIDIME <=1 SENSITIVE Sensitive     CEFTRIAXONE <=1 SENSITIVE Sensitive     CIPROFLOXACIN >=4 RESISTANT Resistant     GENTAMICIN <=1 SENSITIVE Sensitive     IMIPENEM <=0.25 SENSITIVE Sensitive     TRIMETH/SULFA >=320 RESISTANT Resistant     AMPICILLIN/SULBACTAM >=32 RESISTANT Resistant     PIP/TAZO >=128 RESISTANT Resistant     * MODERATE ESCHERICHIA COLI  Culture, respiratory (non-expectorated)     Status: None   Collection Time: 02/19/19 11:08 AM  Result Value Ref Range Status   Specimen Description TRACHEAL ASPIRATE  Final   Special Requests Normal  Final   Gram Stain   Final    MODERATE WBC PRESENT, PREDOMINANTLY PMN RARE GRAM POSITIVE COCCI Performed at [facility], [address] [place], [place] [address]    Culture   Final    FEW ESCHERICHIA COLI FEW VANCOMYCIN RESISTANT ENTEROCOCCUS    Report Status 02/22/2019 FINAL  Final   Organism ID, Bacteria ESCHERICHIA COLI  Final   Organism ID, Bacteria VANCOMYCIN RESISTANT ENTEROCOCCUS  Final      Susceptibility   Escherichia coli - MIC*    AMPICILLIN >=32 RESISTANT Resistant      CEFAZOLIN >=64 RESISTANT Resistant     CEFEPIME <=1 SENSITIVE Sensitive     CEFTAZIDIME <=1 SENSITIVE Sensitive     CEFTRIAXONE <=1 SENSITIVE Sensitive     CIPROFLOXACIN >=4 RESISTANT Resistant     GENTAMICIN <=1 SENSITIVE Sensitive     IMIPENEM <=0.25 SENSITIVE Sensitive     TRIMETH/SULFA >=320 RESISTANT Resistant     AMPICILLIN/SULBACTAM >=32 RESISTANT Resistant     PIP/TAZO >=128 RESISTANT Resistant     Extended ESBL NEGATIVE Sensitive     * FEW ESCHERICHIA COLI   Vancomycin resistant enterococcus - MIC*    AMPICILLIN >=32 RESISTANT Resistant     VANCOMYCIN >=32 RESISTANT Resistant     GENTAMICIN SYNERGY SENSITIVE Sensitive     LINEZOLID 2 SENSITIVE Sensitive     * FEW VANCOMYCIN RESISTANT ENTEROCOCCUS  MRSA PCR Screening     Status: None   Collection Time: 02/22/19 11:26 AM  Result Value Ref Range Status   MRSA by PCR NEGATIVE NEGATIVE Final    Comment:        The GeneXpert MRSA Assay (FDA approved for NASAL specimens only), is one component of a comprehensive MRSA colonization surveillance program. It is not intended to diagnose MRSA infection nor to guide or monitor treatment for  MRSA infections. Performed at [facility], [place] [address], [place], [place] [address]      Studies: No results found.  Scheduled Meds: . acetaminophen  650 mg Per Tube Q6H  . chlorhexidine gluconate (MEDLINE KIT)  15 mL Mouth Rinse BID  . Chlorhexidine Gluconate Cloth  6 each Topical Daily  . feeding supplement (PRO-STAT SUGAR FREE 64)  30 mL Oral Daily  . fentaNYL  1 patch Transdermal Q72H  . free water  100 mL Per Tube Q4H  . heparin injection (subcutaneous)  5,000 Units Subcutaneous Q12H  . levETIRAcetam  1,000 mg Per Tube Q12H  . linezolid  600 mg Per Tube Q12H  . mouth rinse  15 mL Mouth Rinse 10 times per day  . metoprolol tartrate  12.5 mg Per Tube BID  . multivitamin  15 mL Oral Daily  . nutrition supplement (JUVEN)  1 packet Per Tube BID BM  . pantoprazole sodium   40 mg Per Tube Daily  . sodium bicarbonate  650 mg Oral TID  . sodium chloride flush  10-40 mL Intracatheter Q12H  . vitamin C  250 mg Per NG tube BID    Continuous Infusions: . sodium chloride 5 mL/hr at 02/25/19 1200  . ceFEPime (MAXIPIME) IV Stopped (02/25/19 1104)  . feeding supplement (JEVITY 1.2 CAL) 60 mL/hr at 02/25/19 0100     Flora Lipps, MD  [facility] 02/25/2019

## 2019-02-25 NOTE — Progress Notes (Addendum)
[facility] for Infectious Disease    Date of Admission:  01/20/2019      ID: Molly [name] is a 33 y.o. female with   Principal Problem:   Sepsis with multiple organ dysfunction (MOD) ([place]) Active Problems:   Intravenous drug abuse, continuous ([place])   Acute respiratory failure ([place])   Chronic anemia   Polysubstance abuse ([place])   Encounter for orogastric (OG) tube placement   Acute kidney failure ([place])   MSSA bacteremia   Acute septic pulmonary embolism with acute cor pulmonale (June 2019)   Acute encephalopathy   Thrombocytopenia ([facility])   Sepsis with disseminated intravascular coagulopathy (DIC) ([place])   Sepsis ([place])   Acute cystitis with hematuria   DIC (disseminated intravascular coagulation) ([place])   Multifocal pneumonia   Goals of care, counseling/discussion   Advanced care planning/counseling discussion   Leukopenia   Pressure injury of skin    Subjective: afebrile  Medications:  . acetaminophen  650 mg Per Tube Q6H  . chlorhexidine gluconate (MEDLINE KIT)  15 mL Mouth Rinse BID  . Chlorhexidine Gluconate Cloth  6 each Topical Daily  . feeding supplement (PRO-STAT SUGAR FREE 64)  30 mL Oral Daily  . fentaNYL  1 patch Transdermal Q72H  . free water  100 mL Per Tube Q4H  . heparin injection (subcutaneous)  5,000 Units Subcutaneous Q12H  . levETIRAcetam  1,000 mg Per Tube Q12H  . linezolid  600 mg Per Tube Q12H  . mouth rinse  15 mL Mouth Rinse 10 times per day  . metoprolol tartrate  12.5 mg Per Tube BID  . multivitamin  15 mL Oral Daily  . nutrition supplement (JUVEN)  1 packet Per Tube BID BM  . pantoprazole sodium  40 mg Per Tube Daily  . sodium bicarbonate  650 mg Oral TID  . sodium chloride flush  10-40 mL Intracatheter Q12H  . vitamin C  250 mg Per NG tube BID    Objective: Vital signs in last 24 hours: Temp:  [97.7 F (36.5 C)-98.8 F (37.1 C)] 98.4 F (36.9 C) (05/10 0749) Pulse Rate:  [101-118] 104 (05/10 1200) Resp:  [24-40] 27 (05/10  1200) BP: (108-138)/(78-109) 120/98 (05/10 1200) SpO2:  [100 %] 100 % (05/10 1200) FiO2 (%):  [28 %] 28 % (05/10 1122) Weight:  [54 kg] 54 kg (05/10 0343) Physical Exam  Constitutional:  oriented to person, sleepy. appears chronically ill and under-nourished. No distress.  HENT: /AT, PERRLA, no scleral icterus Mouth/Throat: Oropharynx is clear and moist. No oropharyngeal exudate. dobhoff tube in place Cardiovascular: Normal rate, regular rhythm and normal heart sounds. Exam reveals no gallop and no friction rub.  No murmur heard.  Pulmonary/Chest: Effort normal and breath sounds normal. No respiratory distress.  has no wheezes.  Neck = supple, no nuchal rigidity, trach with blood tinged secretions, right ij in place Abdominal: Soft. Bowel sounds are normal.  exhibits no distension. There is no tenderness.  Lymphadenopathy: no cervical adenopathy. No axillary adenopathy Neurological: alert and oriented to person, place, and time.  Skin: Skin is warm and dry. 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 Recommend to discontinue central line once finished course of abtx   OPAT Orders Discharge antibiotics: Cefazolin per pharmacy  Duration: 6 weeks  End Date:  May 19th, 2020  [place] Per Protocol:   Labs Weekly  while on IV antibiotics: _x_ CBC with differential _x_ BMP w GFR   x__ Please pull PIC at completion of IV antibiotics __ Please leave PIC in place until doctor has seen patient or been notified  Fax weekly labs to [phone]  Clinic Follow Up Appt:  June 3rd, at 1045 am EVISIT with Dr. Tommy Medal  I will sign off for now  Please call with further questions.   [facility] for Infectious Diseases Cell: [phone] Pager: [phone]  02/25/2019, 1:16 PM

## 2019-02-26 DIAGNOSIS — Z930 Tracheostomy status: Secondary | ICD-10-CM

## 2019-02-26 LAB — GLUCOSE, CAPILLARY
Glucose-Capillary: 103 mg/dL — ABNORMAL HIGH (ref 70–99)
Glucose-Capillary: 94 mg/dL (ref 70–99)

## 2019-02-26 MED ORDER — ENSURE ENLIVE PO LIQD
237.0000 mL | Freq: Three times a day (TID) | ORAL | Status: DC
  Administered 2019-02-26 – 2019-02-28 (×3): 237 mL via ORAL

## 2019-02-26 NOTE — Progress Notes (Signed)
  Speech Language Pathology Treatment: Dysphagia  Patient Details Name: Molly [name] MRN: [id number] DOB: 1986-05-24 Today's Date: 02/26/2019 Time: 8676-1950 SLP Time Calculation (min) (ACUTE ONLY): 25 min  Assessment / Plan / Recommendation Clinical Impression  Patient seen to address dysphagia goals with nectar thick liquids as well as trial of upgraded solids (soft solids). Patient had been tolerating regular texture solids and nectar thick liquids but because of recent change in mentation, was downgraded to clear liquids only, nectar thickened. During this session, patient was able to consume nectar thick liquids via straw sip and ate nectar thickened chicken broth with broken pieces of saltine crackers with spoon. She did exhibit a couple instances of cough with tracheal expectoration of clear secretions, but otherwise appeared to tolerate PO's well. Per RN, patient has been alert and mentation appears to be back to recent baseline, but did stated that earlier today,  patient vomited materials that appeared to be the supplement she had just consumed. Plan for another regular solid PO trial prior to determining readiness to upgrade. (Patient only requested liquids: juice, broth)     HPI HPI: William Laske 33 year old female who presented to the emergency department with altered mental status.  Patient was admitted to the hospital with working diagnosis of sepsis with multiorgan failure and possible DIC.  Patient condition deteriorated and required invasive mechanical ventilation 4/4.  CT of the chest was done which was positive for septic emboli and right base pneumonia.  Blood cultures grew MSSA.  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  Patient has history of polysubstance/IV drug abuse.  Patient has been on antibiotic therapy with cefazolin.  Underwent tracheostomy on 9/32, which was complicated by bleeding.      SLP Plan  Continue with current plan of care        Recommendations  Diet recommendations: Nectar-thick liquid Liquids provided via: Cup;Straw Medication Administration: Whole meds with liquid Supervision: Patient able to self feed Compensations: Minimize environmental distractions Postural Changes and/or Swallow Maneuvers: Seated upright 90 degrees;Upright 30-60 min after meal      MD: Please consider changing trach tube to : Smaller size         Oral Care Recommendations: Oral care BID Follow up Recommendations: Inpatient Rehab SLP Visit Diagnosis: Dysphagia, oropharyngeal phase (R13.12) Plan: Continue with current plan of care       GO                Dannial Monarch 02/26/2019, 4:51 PM   Sonia Baller, MA, [place] Pager: [phone]

## 2019-02-26 NOTE — Progress Notes (Addendum)
Nutrition Follow-up  DOCUMENTATION CODES:   Not applicable  INTERVENTION:   Continue TF via Cortrak: - Jevity 1.2 @ 60 ml/hr (1440 ml/day) - Pro-stat 30 ml daily - Free water per MD, currently 100 ml q 4 hours  Tube feeding regimen provides 1828 kcal, 95 grams of protein, and 1762 ml of H2O (100% of needs).  - d/c Juven as pt has not been tolerating it  ADDENDUM (1448): Spoke with RN. [name] diet is getting advanced to Regular with nectar-thick liquids. RD to order Ensure Enlive po TID between meals, each supplement provides 350 kcal and 20 grams of protein. Ensure Enlive is suitable for lactose-intolerance.  NUTRITION DIAGNOSIS:   Inadequate oral intake related to acute illness, dysphagia, other (current diet order) as evidenced by other (CLD, nectar-thick).  Ongoing, being addressed via TF  GOAL:   Patient will meet greater than or equal to 90% of their needs  Met via TF  MONITOR:   TF tolerance, PO intake, Supplement acceptance, Diet advancement, Labs, Weight trends, Skin  REASON FOR ASSESSMENT:   Ventilator    ASSESSMENT:    33 y/o female with h/o IVDU/polysubstance abuse and MRSA bacteremia/TV endocarditis/spinal osteomyelitis/septic pulmonary emboli (left AMA July 2019) who presented to the ED with AMS found to be septic with hypothermia, hypotension, and hypoxia.   4/04 - admit, intubated 4/17- trach placed, Cortrak placed at LOT 4/21 - Cortrak tube tip pulled back to gastric placement 4/23 - MBS with diet advancement to Regular with nectar-thick liquids; C.diff + 4/26 - seizures 4/28 - status epilepticus, back on vent 5/01 - diet advanced from NPO to FLD, nectar thick 5/03 - diet changed to CLD, nectar thick d/t lactose intolerance  Pt remains on nectar-thick clear liquid diet. No meal completion recorded since 02/20/19.  Spoke with RN. RN reports pt is not tolerating Juven and had an episode of emesis yesterday in which the pink Juven powder was  visualized. RN reports that every time pt is given Juven, she has an episode of emesis. RD will d/c Juven order at this time.  Due to recent recurrent episodes of emesis, will not change from continuous feeds to bolus.  RN also reports issues with pt's Cortrak flushing and asked that Cortrak team come to look at it. RD alerted Cortrak team of issue and they will assess.  Weight down 7 lbs over the last 1 week. Unsure whether this is true weight loss vs fluid fluctuations. Will continue to monitor trends.  Current TF: Jevity 1.2 @ 60 ml/hr, Pro-stat 30 ml daily, free water 100 ml q 4 hours, Juven BID  Medications reviewed and include: Protonix, sodium bicarb 650 mg TID, vitamin C 250 mg BID, liquid MVI, IV abx  Labs reviewed: BUN 65 (H), creatinine 1.23 (H), hemoglobin 7.1 (L) CBG's: 103, 100 x 24 hours  UOP: 1450 ml x 24 hours Emesis: 100 ml on 5/10 I/O's: +15.7 L since admit  Diet Order:   Diet Order            Diet clear liquid Room service appropriate? Yes with Assist; Fluid consistency: Nectar Thick  Diet effective now              EDUCATION NEEDS:   Not appropriate for education at this time  Skin:  Skin Assessment: Skin Integrity Issues: DTI: sacrum (new 5/1) Stage II: mid back (new 4/28) Other: MASD: sacrum  Last BM:  02/26/19 medium type 6  Height:   Ht Readings from Last 1 Encounters:  01/20/19 _0  (1.549 m)    Weight:   Wt Readings from Last 1 Encounters:  02/26/19 54.3 kg    Ideal Body Weight:  47.7 kg  BMI:  Body mass index is 22.62 kg/m.  Estimated Nutritional Needs:   Kcal:  1795-1960 kcal  Protein:  90-100 grams  Fluid:  >/= 1.7 L    Gaynell Face, MS, RD, LDN Inpatient Clinical Dietitian Pager: [phone] Weekend/After Hours: [phone]

## 2019-02-26 NOTE — Progress Notes (Signed)
Occupational Therapy Treatment Patient Details Name: Molly [name] MRN: [id number] DOB: [birth year] Today's Date: 02/26/2019    History of present illness Pt adm for acute respiratory failure due to Septic pulmonary embolus due to MRSA tricuspid vegetation. Pt intubated 01/20/19. Pt with multiple seizures 4/28-4/29. PMH - polysubstance abuse, inadequately treated MRSA endocarditis having signed out AMA  in July 2019, osteomyelitis of spine   OT comments  Focus of session on bed mobility, sitting balance and tolerance at EOB and shoulder AAROM. Pt demonstrating ability to self feed with spoon. Refusing to communicate with white board. VSS throughout session.   Follow Up Recommendations  CIR    Equipment Recommendations  None recommended by OT    Recommendations for Other Services      Precautions / Restrictions Precautions Precautions: Fall Precaution Comments: trach, flexiseal Restrictions Weight Bearing Restrictions: No       Mobility Bed Mobility Overal bed mobility: Needs Assistance Bed Mobility: Supine to Sit;Sit to Supine;Rolling Rolling: Min assist   Supine to sit: Max assist Sit to supine: Max assist   General bed mobility comments: assist for LEs and trunk, pt grimacing and reporting back pain upon sitting  Transfers                 General transfer comment: deferred    Balance Overall balance assessment: Needs assistance Sitting-balance support: Bilateral upper extremity supported;Feet unsupported Sitting balance-Leahy Scale: Poor Sitting balance - Comments: sat x 5 minutes with mod assist initially, progressed to min assist Postural control: Posterior lean                                 ADL either performed or assessed with clinical judgement   ADL   Eating/Feeding: Set up;Bed level(HOB up) Eating/Feeding Details (indicate cue type and reason): broth with spoon                                         Vision        Perception     Praxis      Cognition Arousal/Alertness: Awake/alert Behavior During Therapy: Flat affect Overall Cognitive Status: Difficult to assess Area of Impairment: Safety/judgement;Awareness                       Following Commands: Follows one step commands with increased time Safety/Judgement: Decreased awareness of safety Awareness: Intellectual Problem Solving: Slow processing;Decreased initiation;Requires verbal cues;Requires tactile cues General Comments: pt hitting table to communicate her displeasure, refused to use whiteboard to communicate, per nurse, she does write on night shift        Exercises Exercises: General Upper Extremity General Exercises - Upper Extremity Shoulder Flexion: AAROM;Both;10 reps;Supine   Shoulder Instructions       General Comments      Pertinent Vitals/ Pain       Pain Assessment: Faces Faces Pain Scale: Hurts even more Pain Location: Back Pain Descriptors / Indicators: Grimacing Pain Intervention(s): Repositioned;Monitored during session  Home Living                                          Prior Functioning/Environment              Frequency  Min 3X/week        Progress Toward Goals  OT Goals(current goals can now be found in the care plan section)  Progress towards OT goals: Progressing toward goals  Acute Rehab OT Goals Patient Stated Goal: Pt unable OT Goal Formulation: With patient Time For Goal Achievement: 03/01/19 Potential to Achieve Goals: [place] Discharge plan remains appropriate    Co-evaluation                 AM-PAC OT "6 Clicks" Daily Activity     Outcome Measure   Help from another person eating meals?: A Little Help from another person taking care of personal grooming?: A Lot Help from another person toileting, which includes using toliet, bedpan, or urinal?: Total Help from another person bathing (including washing, rinsing, drying)?:  Total Help from another person to put on and taking off regular upper body clothing?: A Lot Help from another person to put on and taking off regular lower body clothing?: Total 6 Click Score: 10    End of Session Equipment Utilized During Treatment: Oxygen  OT Visit Diagnosis: Other abnormalities of gait and mobility (R26.89);Unsteadiness on feet (R26.81);Muscle weakness (generalized) (M62.81);Other symptoms and signs involving cognitive function   Activity Tolerance Patient limited by fatigue   Patient Left in bed;with call bell/phone within reach   Nurse Communication Other (comment)(pt indicating she wanted suctioning)        Time: 2458-0998 OT Time Calculation (min): 26 min  Charges: OT General Charges $OT Visit: 1 Visit OT Treatments $Therapeutic Activity: 23-37 mins  Nestor Lewandowsky, OTR/L Acute Rehabilitation Services Pager: [phone] Office: [phone]   Malka So 02/26/2019, 3:31 PM

## 2019-02-26 NOTE — Progress Notes (Signed)
CSW acknowledging consult for disposition needs. CSW continuing to follow to assist with disposition. CSW reviewed chart and spoke with financial counselor, Jenny Reichmann. Barriers to placement are that patient has no insurance (disability application has not yet been completed, awaiting [place] to send paperwork as they are not able to visit the hospital to complete paperwork at this time; Medicaid has been denied but mother is appealing) and that the patient has a new trach that is not yet 14 days old and it remains cuffed. SNF will not take patient with a trach until it is 62 days old, and it needs to be uncuffed prior to discharge to SNF.   CSW to continue to follow for disposition needs.  Laveda Abbe, [place] Clinical Social Worker [phone]

## 2019-02-26 NOTE — Progress Notes (Signed)
PROGRESS NOTE  Molly [name] PJK:[id number] DOB: 1985/11/14 DOA: 01/20/2019 PCP: Patient, No Pcp Per   LOS: 37 days   Brief narrative: 33 y/o F with a history of IV drug abuse/polysubstance abuse (presented with urine drug screen positive for opiates and amphetamines) and a previous history in June or July 2019 of inadequately treated MRSA endocarditis having signed out AMA at that time.  She had a tricuspid vegetation at that time.  Admitted 4/4 hypotensive and in respiratory distress requiring intubation at [facility].  Admitted for acute respiratory failure/septic shock/ AKI due to septic emboli with MSSA bacteremia.  Significant Hospital Events    4/04 Admit with hypotension / resp distress, intubated  4/05 Family discussion > concern terminal based on sepsis, AKI, septic emboli pancytopenia  4/10 UOP improving, off pressors, low grade fevers  4/11 Agitated, PRN versed. Awake / FC. Looks best in many days. Failed SBT 4/12 Failed SBT.  AKI improving, tx PRBC.  Less alert on precedex, methadone  4/13 More alert, FC. On precedex. Failed SBT. ID recommending PICC for antibiotcs > mother not sure about goals of care. S/p 1 unit PRBC    4/17 Trach placement 4/21 tolerating trach collar trials. 4/23 cdiff result positive overnight with continued diarrhea 4/26 seizure 4/28 status epilepticus  Subjective: Denies interval complaints.  No chest pain, shortness of breath fever or chills.  Complains of mild back pain.  Assessment/Plan:  Principal Problem:   Sepsis with multiple organ dysfunction (MOD) (HCC) Active Problems:   Intravenous drug abuse, continuous (HCC)   Acute respiratory failure (HCC)   Chronic anemia   Polysubstance abuse ([place])   Encounter for orogastric (OG) tube placement   Acute kidney failure ([place])   MSSA bacteremia   Acute septic pulmonary embolism with acute cor pulmonale (June 2019)   Acute encephalopathy   Thrombocytopenia (HCC)   Sepsis with disseminated  intravascular coagulopathy (DIC) ([place])   Sepsis ([place])   Acute cystitis with hematuria   DIC (disseminated intravascular coagulation) ([place])   Multifocal pneumonia   Goals of care, counseling/discussion   Advanced care planning/counseling discussion   Leukopenia   Pressure injury of skin   Status post tracheostomy ([place])  New onset seizure 4/26/ Status epilepticus 4/28, PRES. No further seizures reported.  Keppra as per neurology..  Dilantin was stopped on 02/22/2019.  Will need neurology follow-up as outpatient for discharge.  Anxiety:  PRN Klonopin,controlled.  Essential hypertension Continue on metoprolol.  Blood pressure is reasonably controlled.  MSSA bacteremia with tricuspid valve endocarditis On cefepime and linezolid.  Axis disease was consulted and appreciated input.  As per discussion we will transition to Ancef after completion of linezolid.  C. difficile colitis Treated, does have some loose stools but likely from tube feeding.  ecoli/enterococcus pna:  on cefepime and Zyvox  Acute resp failure with hypoxia s/p trach Septic pulmonary emboi Ecoli pna  on cefepime and Zyvox, trach management as per PCCM  Severe protein calorie malnutrition/Dysphagia Continue tube feeding. Clear liquids orally  Acute kidney injury We will closely monitor.  Creatinine 1.28  Non-anion gap metabolic acidosis:  Continue bicarbonate tablets  Anemia of chronic disease.  Hemoglobin is 7.2.  No need for transfusion at this time.  No bleeding reported.  Nutrition tube feeding on Jevity.  Dietary on board.  Thrombocytopenia: improving  We will continue to monitor.  Platelet count of 166 today.  VTE Prophylaxis:  Heparin subq  Code Status: Full code  Family Communication: None today.  Disposition Plan: CIR/SNF  Consultants: Nephrology Neurology   Procedures: ETT 4/4 >> 4/17 R Femoral line 4/4 >> 4/8 R IJ Tunneled CVC (IR) 4/21 >>  Trach 4/17 >>  Antibiotics:  Anti-infectives (From admission, onward)   Start     Dose/Rate Route Frequency Ordered Stop   02/22/19 1015  linezolid (ZYVOX) tablet 600 mg     600 mg Per Tube Every 12 hours 02/22/19 1012     02/22/19 1000  meropenem (MERREM) 2 g in sodium chloride 0.9 % 100 mL IVPB  Status:  Discontinued     2 g 200 mL/hr over 30 Minutes Intravenous Every 12 hours 02/22/19 0853 02/22/19 0934   02/22/19 1000  ceFEPIme (MAXIPIME) 2 g in sodium chloride 0.9 % 100 mL IVPB     2 g 200 mL/hr over 30 Minutes Intravenous Every 12 hours 02/22/19 0935     02/22/19 1000  linezolid (ZYVOX) 100 MG/5ML suspension 600 mg  Status:  Discontinued     600 mg Per Tube Every 12 hours 02/22/19 0938 02/22/19 1008   02/20/19 2200  ceFEPIme (MAXIPIME) 2 g in sodium chloride 0.9 % 100 mL IVPB  Status:  Discontinued     2 g 200 mL/hr over 30 Minutes Intravenous Every 12 hours 02/20/19 1444 02/22/19 0848   02/09/19 1400  ceFAZolin (ANCEF) IVPB 2g/100 mL premix  Status:  Discontinued     2 g 200 mL/hr over 30 Minutes Intravenous Every 8 hours 02/09/19 0902 02/20/19 1444   02/08/19 1000  vancomycin (VANCOCIN) 50 mg/mL oral solution 125 mg  Status:  Discontinued     125 mg Per Tube 4 times daily 02/08/19 0921 02/20/19 0823   02/06/19 1200  fluconazole (DIFLUCAN) tablet 100 mg     100 mg Oral Daily 02/06/19 1146 02/08/19 0956   01/29/19 1000  ceFAZolin (ANCEF) IVPB 2g/100 mL premix  Status:  Discontinued     2 g 200 mL/hr over 30 Minutes Intravenous Every 12 hours 01/28/19 2145 02/09/19 0902   01/28/19 2200  ceFAZolin (ANCEF) IVPB 1 g/50 mL premix     1 g 100 mL/hr over 30 Minutes Intravenous  Once 01/28/19 2145 01/28/19 2248   01/28/19 2130  ceFAZolin (ANCEF) IVPB 2g/100 mL premix  Status:  Discontinued     2 g 200 mL/hr over 30 Minutes Intravenous Every 12 hours 01/28/19 2121 01/28/19 2145   01/20/19 2000  ceFAZolin (ANCEF) IVPB 1 g/50 mL premix  Status:  Discontinued     1 g 100 mL/hr over 30 Minutes Intravenous Every 24  hours 01/20/19 1831 01/28/19 2121   01/20/19 1400  piperacillin-tazobactam (ZOSYN) IVPB 2.25 g  Status:  Discontinued    Note to Pharmacy:  Pharmacy to dose   2.25 g 66.7 mL/hr over 30 Minutes Intravenous Every 8 hours 01/20/19 1234 01/20/19 1239   01/20/19 1400  piperacillin-tazobactam (ZOSYN) IVPB 2.25 g  Status:  Discontinued     2.25 g 100 mL/hr over 30 Minutes Intravenous Every 8 hours 01/20/19 1239 01/20/19 1831   01/20/19 0545  ceftaroline (TEFLARO) injection SOLR 300 mg  Status:  Discontinued     300 mg Intravenous  Once 01/20/19 0534 01/20/19 0535   01/20/19 0545  ceftaroline (TEFLARO) 300 mg in sodium chloride 0.9 % 250 mL IVPB     300 mg 250 mL/hr over 60 Minutes Intravenous  Once 01/20/19 0539 01/20/19 0727   01/20/19 0430  vancomycin (VANCOCIN) IVPB 1000 mg/200 mL premix  Status:  Discontinued     1,000 mg 200  mL/hr over 60 Minutes Intravenous  Once 01/20/19 0348 01/20/19 0519   01/20/19 0400  piperacillin-tazobactam (ZOSYN) IVPB 3.375 g     3.375 g 100 mL/hr over 30 Minutes Intravenous  Once 01/20/19 0348 01/20/19 0423     Objective: Vitals:   02/26/19 0750 02/26/19 0951  BP:  106/89  Pulse:  (!) 107  Resp:    Temp: 98.1 F (36.7 C)   SpO2:      Intake/Output Summary (Last 24 hours) at 02/26/2019 1002 Last data filed at 02/26/2019 0700 Gross per 24 hour  Intake 1640.14 ml  Output 1550 ml  Net 90.14 ml   Filed Weights   02/24/19 0500 02/25/19 0343 02/26/19 0500  Weight: 55 kg 54 kg 54.3 kg   Body mass index is 22.62 kg/m.   Physical Exam: General: Thinly built not in obvious distress, mildly anxious HENT: Normocephalic, pupils equally reacting to light and accommodation.  No scleral pallor or icterus noted. Oral mucosa is moist.  Right sided CVC catheter.  Nasal feeding tube in place.  Trach collar positive. Chest:  Clear breath sounds.  Diminished breath sounds bilaterally. No crackles or wheezes.  CVS: S1 &S2 heard. No murmur.  Regular rate and rhythm.  Abdomen: Soft, nontender, nondistended.  Bowel sounds are heard.  Liver is not palpable, no abdominal mass palpated.  External urinary catheter Extremities: No cyanosis, clubbing or edema.  Peripheral pulses are palpable. Psych: Alert, awake and communicative. CNS:  No cranial nerve deficits.  Moving all extremities.  Generalized weakness noted.   Skin: Warm and dry.   Data Review: I have personally reviewed the following laboratory data and studies,  CBC: Recent Labs  Lab 02/20/19 0511 02/21/19 0352 02/22/19 0420 02/24/19 0511 02/25/19 0323  WBC 3.8* 3.4* 3.9* 3.7* 5.1  NEUTROABS 2.8 2.2 2.6  --   --   HGB 7.7* 7.0* 7.1* 7.2* 7.1*  HCT 24.8* 22.5* 22.7* 22.6* 22.4*  MCV 91.5 92.2 92.3 93.0 94.1  PLT 101* 101* 125* 147* 314   Basic Metabolic Panel: Recent Labs  Lab 02/20/19 0511 02/21/19 0352 02/22/19 0420 02/24/19 0511 02/25/19 0323  NA 142 144 144 146* 145  K 3.4* 3.7 3.7 4.0 4.2  CL 114* 112* 115* 116* 119*  CO2 14* 16* 15* 15* 17*  GLUCOSE 110* 113* 123* 114* 113*  BUN 70* 77* 78* 71* 65*  CREATININE 1.58* 1.50* 1.39* 1.28* 1.23*  CALCIUM 7.6* 7.8* 7.7* 7.9* 7.9*  MG  --   --   --  1.4* 1.7  PHOS  --   --   --  3.0  --    Liver Function Tests: No results for input(s): AST, ALT, ALKPHOS, BILITOT, PROT, ALBUMIN in the last 168 hours. No results for input(s): LIPASE, AMYLASE in the last 168 hours. No results for input(s): AMMONIA in the last 168 hours. Cardiac Enzymes: No results for input(s): CKTOTAL, CKMB, CKMBINDEX, TROPONINI in the last 168 hours. BNP (last 3 results) Recent Labs    04/07/18 2200 04/11/18 0900 04/22/18 1449  BNP 75.2 518.4* 357.0*    ProBNP (last 3 results) No results for input(s): PROBNP in the last 8760 hours.  CBG: Recent Labs  Lab 02/24/19 1139 02/24/19 2324 02/25/19 0746 02/25/19 1615 02/26/19 0746  GLUCAP 117* 123* 94 100* 103*   Recent Results (from the past 240 hour(s))  Culture, respiratory (non-expectorated)      Status: None   Collection Time: 02/18/19 11:38 AM  Result Value Ref Range Status   Specimen  Description TRACHEAL ASPIRATE  Final   Special Requests Normal  Final   Gram Stain   Final    ABUNDANT WBC PRESENT, PREDOMINANTLY PMN RARE SQUAMOUS EPITHELIAL CELLS PRESENT RARE GRAM NEGATIVE RODS Performed at [facility], [place] [address], [place], [place] [address]    Culture MODERATE ESCHERICHIA COLI  Final   Report Status 02/20/2019 FINAL  Final   Organism ID, Bacteria ESCHERICHIA COLI  Final      Susceptibility   Escherichia coli - MIC*    AMPICILLIN >=32 RESISTANT Resistant     CEFAZOLIN >=64 RESISTANT Resistant     CEFEPIME <=1 SENSITIVE Sensitive     CEFTAZIDIME <=1 SENSITIVE Sensitive     CEFTRIAXONE <=1 SENSITIVE Sensitive     CIPROFLOXACIN >=4 RESISTANT Resistant     GENTAMICIN <=1 SENSITIVE Sensitive     IMIPENEM <=0.25 SENSITIVE Sensitive     TRIMETH/SULFA >=320 RESISTANT Resistant     AMPICILLIN/SULBACTAM >=32 RESISTANT Resistant     PIP/TAZO >=128 RESISTANT Resistant     * MODERATE ESCHERICHIA COLI  Culture, respiratory (non-expectorated)     Status: None   Collection Time: 02/19/19 11:08 AM  Result Value Ref Range Status   Specimen Description TRACHEAL ASPIRATE  Final   Special Requests Normal  Final   Gram Stain   Final    MODERATE WBC PRESENT, PREDOMINANTLY PMN RARE GRAM POSITIVE COCCI Performed at [facility], [address] [place], [place] [address]    Culture   Final    FEW ESCHERICHIA COLI FEW VANCOMYCIN RESISTANT ENTEROCOCCUS    Report Status 02/22/2019 FINAL  Final   Organism ID, Bacteria ESCHERICHIA COLI  Final   Organism ID, Bacteria VANCOMYCIN RESISTANT ENTEROCOCCUS  Final      Susceptibility   Escherichia coli - MIC*    AMPICILLIN >=32 RESISTANT Resistant     CEFAZOLIN >=64 RESISTANT Resistant     CEFEPIME <=1 SENSITIVE Sensitive     CEFTAZIDIME <=1 SENSITIVE Sensitive     CEFTRIAXONE <=1 SENSITIVE Sensitive     CIPROFLOXACIN >=4  RESISTANT Resistant     GENTAMICIN <=1 SENSITIVE Sensitive     IMIPENEM <=0.25 SENSITIVE Sensitive     TRIMETH/SULFA >=320 RESISTANT Resistant     AMPICILLIN/SULBACTAM >=32 RESISTANT Resistant     PIP/TAZO >=128 RESISTANT Resistant     Extended ESBL NEGATIVE Sensitive     * FEW ESCHERICHIA COLI   Vancomycin resistant enterococcus - MIC*    AMPICILLIN >=32 RESISTANT Resistant     VANCOMYCIN >=32 RESISTANT Resistant     GENTAMICIN SYNERGY SENSITIVE Sensitive     LINEZOLID 2 SENSITIVE Sensitive     * FEW VANCOMYCIN RESISTANT ENTEROCOCCUS  MRSA PCR Screening     Status: None   Collection Time: 02/22/19 11:26 AM  Result Value Ref Range Status   MRSA by PCR NEGATIVE NEGATIVE Final    Comment:        The GeneXpert MRSA Assay (FDA approved for NASAL specimens only), is one component of a comprehensive MRSA colonization surveillance program. It is not intended to diagnose MRSA infection nor to guide or monitor treatment for MRSA infections. Performed at [facility], [place] [address], [place], [place] [address]      Studies: No results found.  Scheduled Meds: . acetaminophen  650 mg Per Tube Q6H  . chlorhexidine  15 mL Mouth Rinse BID  . Chlorhexidine Gluconate Cloth  6 each Topical Daily  . feeding supplement (PRO-STAT SUGAR FREE 64)  30 mL Oral Daily  . fentaNYL  1 patch Transdermal Q72H  . free water  100 mL Per Tube Q4H  . heparin injection (subcutaneous)  5,000 Units Subcutaneous Q12H  . levETIRAcetam  1,000 mg Per Tube Q12H  . linezolid  600 mg Per Tube Q12H  . mouth rinse  15 mL Mouth Rinse q12n4p  . metoprolol tartrate  12.5 mg Per Tube BID  . multivitamin  15 mL Oral Daily  . nutrition supplement (JUVEN)  1 packet Per Tube BID BM  . pantoprazole sodium  40 mg Per Tube Daily  . sodium bicarbonate  650 mg Oral TID  . sodium chloride flush  10-40 mL Intracatheter Q12H  . vitamin C  250 mg Per NG tube BID    Continuous Infusions: . sodium chloride 5 mL/hr at  02/26/19 0700  . ceFEPime (MAXIPIME) IV 2 g (02/26/19 0951)  . feeding supplement (JEVITY 1.2 CAL) 60 mL/hr at 02/26/19 0600     Flora Lipps, MD  [facility] 02/26/2019

## 2019-02-27 ENCOUNTER — Inpatient Hospital Stay (HOSPITAL_COMMUNITY): Payer: Medicaid Other

## 2019-02-27 DIAGNOSIS — E46 Unspecified protein-calorie malnutrition: Secondary | ICD-10-CM

## 2019-02-27 DIAGNOSIS — R0600 Dyspnea, unspecified: Secondary | ICD-10-CM

## 2019-02-27 LAB — BASIC METABOLIC PANEL
Anion gap: 11 (ref 5–15)
BUN: 54 mg/dL — ABNORMAL HIGH (ref 6–20)
CO2: 17 mmol/L — ABNORMAL LOW (ref 22–32)
Calcium: 7.8 mg/dL — ABNORMAL LOW (ref 8.9–10.3)
Chloride: 115 mmol/L — ABNORMAL HIGH (ref 98–111)
Creatinine, Ser: 1.11 mg/dL — ABNORMAL HIGH (ref 0.44–1.00)
GFR calc Af Amer: >60 mL/min (ref >60)
GFR calc non Af Amer: >60 mL/min (ref >60)
Glucose, Bld: 101 mg/dL — ABNORMAL HIGH (ref 70–99)
Potassium: 4.4 mmol/L (ref 3.5–5.1)
Sodium: 143 mmol/L (ref 135–145)

## 2019-02-27 LAB — CBC
HCT: 18.8 % — ABNORMAL LOW (ref 36.0–46.0)
Hemoglobin: 5.8 g/dL — CL (ref 12.0–15.0)
MCH: 28.4 pg (ref 26.0–34.0)
MCHC: 30.9 g/dL (ref 30.0–36.0)
MCV: 92.2 fL (ref 80.0–100.0)
Platelets: 141 10*3/uL — ABNORMAL LOW (ref 150–400)
RBC: 2.04 MIL/uL — ABNORMAL LOW (ref 3.87–5.11)
RDW: 18.4 % — ABNORMAL HIGH (ref 11.5–15.5)
WBC: 4.4 10*3/uL (ref 4.0–10.5)
nRBC: 0 % (ref 0.0–0.2)

## 2019-02-27 LAB — GLUCOSE, CAPILLARY
Glucose-Capillary: 102 mg/dL — ABNORMAL HIGH (ref 70–99)
Glucose-Capillary: 111 mg/dL — ABNORMAL HIGH (ref 70–99)
Glucose-Capillary: 112 mg/dL — ABNORMAL HIGH (ref 70–99)
Glucose-Capillary: 92 mg/dL (ref 70–99)

## 2019-02-27 LAB — MAGNESIUM: Magnesium: 1.5 mg/dL — ABNORMAL LOW (ref 1.7–2.4)

## 2019-02-27 LAB — OCCULT BLOOD X 1 CARD TO LAB, STOOL: Fecal Occult Bld: POSITIVE — AB

## 2019-02-27 LAB — PREPARE RBC (CROSSMATCH)

## 2019-02-27 MED ORDER — VITAMIN C 500 MG PO TABS
250.0000 mg | ORAL_TABLET | Freq: Two times a day (BID) | ORAL | Status: DC
  Administered 2019-02-27 – 2019-03-05 (×10): 250 mg via ORAL
  Filled 2019-02-27 (×15): qty 1

## 2019-02-27 MED ORDER — MIDAZOLAM HCL 2 MG/2ML IJ SOLN
2.0000 mg | Freq: Once | INTRAMUSCULAR | Status: AC
  Administered 2019-02-27: 2 mg via INTRAVENOUS
  Filled 2019-02-27: qty 2

## 2019-02-27 MED ORDER — IOHEXOL 300 MG/ML  SOLN
50.0000 mL | Freq: Once | INTRAMUSCULAR | Status: AC | PRN
  Administered 2019-02-27: 16:00:00 30 mL via ORAL

## 2019-02-27 MED ORDER — LIDOCAINE VISCOUS HCL 2 % MT SOLN
OROMUCOSAL | Status: AC
  Administered 2019-02-27: 16:00:00 4 mL via OROMUCOSAL
  Filled 2019-02-27: qty 15

## 2019-02-27 MED ORDER — PANTOPRAZOLE SODIUM 40 MG PO TBEC
40.0000 mg | DELAYED_RELEASE_TABLET | Freq: Every day | ORAL | Status: DC
  Administered 2019-02-27 – 2019-03-05 (×6): 40 mg via ORAL
  Filled 2019-02-27 (×8): qty 1

## 2019-02-27 MED ORDER — ACETAMINOPHEN 160 MG/5ML PO SOLN
650.0000 mg | Freq: Four times a day (QID) | ORAL | Status: DC
  Administered 2019-02-27 – 2019-03-05 (×24): 650 mg via ORAL
  Filled 2019-02-27 (×26): qty 20.3

## 2019-02-27 MED ORDER — LIDOCAINE VISCOUS HCL 2 % MT SOLN
15.0000 mL | Freq: Once | OROMUCOSAL | Status: AC
  Administered 2019-02-27: 16:00:00 4 mL via OROMUCOSAL

## 2019-02-27 MED ORDER — MAGNESIUM SULFATE 2 GM/50ML IV SOLN
2.0000 g | Freq: Once | INTRAVENOUS | Status: AC
  Administered 2019-02-27: 09:00:00 2 g via INTRAVENOUS
  Filled 2019-02-27: qty 50

## 2019-02-27 MED ORDER — LEVETIRACETAM 100 MG/ML PO SOLN
1000.0000 mg | Freq: Two times a day (BID) | ORAL | Status: DC
  Administered 2019-02-27 – 2019-02-28 (×3): 1000 mg via ORAL
  Filled 2019-02-27 (×5): qty 10

## 2019-02-27 MED ORDER — SODIUM CHLORIDE 0.9% IV SOLUTION: Freq: Once | INTRAVENOUS | Status: DC

## 2019-02-27 MED ORDER — METOPROLOL TARTRATE 25 MG/10 ML ORAL SUSPENSION
12.5000 mg | Freq: Two times a day (BID) | ORAL | Status: DC
  Administered 2019-02-27 – 2019-02-28 (×3): 12.5 mg via ORAL
  Filled 2019-02-27 (×3): qty 10
  Filled 2019-02-27: qty 5

## 2019-02-27 MED ORDER — LINEZOLID 600 MG PO TABS
600.0000 mg | ORAL_TABLET | Freq: Two times a day (BID) | ORAL | Status: AC
  Administered 2019-02-27 – 2019-02-28 (×4): 600 mg via ORAL
  Filled 2019-02-27 (×5): qty 1

## 2019-02-27 NOTE — Progress Notes (Signed)
Cortrak clogged. Stopped TF and clamped. Attempted to unclog with no success.   Weldon Inches, RN

## 2019-02-27 NOTE — Progress Notes (Signed)
Physical Therapy Treatment Patient Details Name: Molly [name] MRN: [id number] DOB: 11/11/1985 Today's Date: 02/27/2019    History of Present Illness Pt adm for acute respiratory failure due to Septic pulmonary embolus due to MRSA tricuspid vegetation. Pt intubated 01/20/19. Pt with multiple seizures 4/28-4/29. PMH - polysubstance abuse, inadequately treated MRSA endocarditis having signed out AMA  in July 2019, osteomyelitis of spine    PT Comments    Patient seen for activity progression and OOB mobility. Tolerated session well with stable VS but limited activity due to significant incontinence of stool and bladder, hygiene and pericare performed. Current POC remains appropriate.   Follow Up Recommendations  CIR     Equipment Recommendations  Other (comment)(To be determined)    Recommendations for Other Services       Precautions / Restrictions Precautions Precautions: Fall Precaution Comments: trach Restrictions Weight Bearing Restrictions: No    Mobility  Bed Mobility Overal bed mobility: Needs Assistance Bed Mobility: Supine to Sit;Sit to Supine;Rolling     Supine to sit: Max assist     General bed mobility comments: assist for elevation of trunk to upright and LEs to EOB. pt grimacing and reporting back pain upon sitting  Transfers Overall transfer level: Needs assistance Equipment used: 1 person hand held assist Transfers: Sit to/from [facility] Sit to Stand: Min assist Stand pivot transfers: Min assist       General transfer comment: Min assist for power up to standing with face to face positioning, assist for support during transitional movement from bed to chair.  Ambulation/Gait             General Gait Details: limited to transfers due to incontinence of stool   Stairs             Wheelchair Mobility    Modified Rankin (Stroke Patients Only)       Balance Overall balance assessment: Needs assistance   Sitting  balance-Leahy Scale: Fair     Standing balance support: Bilateral upper extremity supported Standing balance-Leahy Scale: Poor Standing balance comment: reliant on external assit                            Cognition Arousal/Alertness: Awake/alert Behavior During Therapy: Flat affect Overall Cognitive Status: Difficult to assess Area of Impairment: Safety/judgement;Awareness                       Following Commands: Follows one step commands with increased time Safety/Judgement: Decreased awareness of safety Awareness: Intellectual Problem Solving: Slow processing;Decreased initiation;Requires verbal cues;Requires tactile cues General Comments: pt hitting table to communicate her displeasure, refused to use whiteboard to communicate, per nurse, she does write on night shift      Exercises      General Comments General comments (skin integrity, edema, etc.): tolerated hygiene and pericare with mutliple sit<> stnads      Pertinent Vitals/Pain Pain Assessment: Faces Faces Pain Scale: Hurts even more Pain Location: back Pain Descriptors / Indicators: Grimacing Pain Intervention(s): Monitored during session;Repositioned;Patient requesting pain meds-RN notified    Home Living                      Prior Function            PT Goals (current goals can now be found in the care plan section) Acute Rehab PT Goals Patient Stated Goal: Pt unable PT Goal Formulation: Patient unable to  participate in goal setting Time For Goal Achievement: 03/01/19 Potential to Achieve Goals: Fair Progress towards PT goals: Progressing toward goals    Frequency    Min 3X/week      PT Plan Current plan remains appropriate    Co-evaluation              AM-PAC PT "6 Clicks" Mobility   Outcome Measure  Help needed turning from your back to your side while in a flat bed without using bedrails?: A Little Help needed moving from lying on your back to  sitting on the side of a flat bed without using bedrails?: A Little Help needed moving to and from a bed to a chair (including a wheelchair)?: A Little Help needed standing up from a chair using your arms (e.g., wheelchair or bedside chair)?: A Lot Help needed to walk in hospital room?: A Lot Help needed climbing 3-5 steps with a railing? : Total 6 Click Score: 14    End of Session Equipment Utilized During Treatment: Oxygen(trach collar) Activity Tolerance: Patient limited by fatigue Patient left: in bed;with bed alarm set;with call bell/phone within reach Nurse Communication: Mobility status PT Visit Diagnosis: Other abnormalities of gait and mobility (R26.89);Muscle weakness (generalized) (M62.81);Unsteadiness on feet (R26.81)     Time: 8588-5027 PT Time Calculation (min) (ACUTE ONLY): 25 min  Charges:  $Therapeutic Activity: 8-22 mins $Self Care/Home Management: 8-22                     Alben Deeds, PT DPT  Board Certified Neurologic Specialist Acute Rehabilitation Services Pager [phone] Office [place] 02/27/2019, 9:07 AM

## 2019-02-27 NOTE — Progress Notes (Signed)
Assisted tele visit to patient with mother.  Molly [name], [name], RN

## 2019-02-27 NOTE — Progress Notes (Signed)
eLink Physician-Brief Progress Note Patient Name: Molly [name] DOB: 1986/09/07 MRN: [id number]   Date of Service  02/27/2019  HPI/Events of Note  Anemia - Hgb = 5.8.  eICU Interventions  Will transfuse 2 units PRBC.     Intervention Category Major Interventions: Other:  Lysle Dingwall 02/27/2019, 3:58 AM

## 2019-02-27 NOTE — Progress Notes (Signed)
Pt coretrack was clogged this AM speech came and evaluated pt to see about changing her diet and she passed swallow eval. All of pt medications changed to oral RN gave pt oral medications and she was able to hold them down for a couple of minutes then she threw up all her morning medications. RN paged MD and he placed an order for IR to fix the coretrack or replace it.

## 2019-02-27 NOTE — Progress Notes (Signed)
  Speech Language Pathology Treatment: Dysphagia  Patient Details Name: Molly [name] MRN: [id number] DOB: 12-23-1985 Today's Date: 02/27/2019 Time: 0920-1000 SLP Time Calculation (min) (ACUTE ONLY): 40 min  Assessment / Plan / Recommendation Clinical Impression  Patient seen to address dysphagia goals and to determine readiness to return to regular solids (had been downgraded to just nectar thick liquids recently due to changes in alertness). Patient was sitting up in recliner upon SLP arrival and was able to self-feed thin liquids via straw sips and regular solids (graham cracker) without any overt s/s of aspiration or penetration and with trace to no oral residuals post swallow. SLP feels that patient is ready to return to regular solids, continue with nectar thick liquids.   RN stated MD plans to change from #6 cuffed to #6 cuffless trach.    HPI HPI: Molly [name] 33 year old female who presented to the emergency department with altered mental status.  Patient was admitted to the hospital with working diagnosis of sepsis with multiorgan failure and possible DIC.  Patient condition deteriorated and required invasive mechanical ventilation 4/4.  CT of the chest was done which was positive for septic emboli and right base pneumonia.  Blood cultures grew MSSA.  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  Patient has history of polysubstance/IV drug abuse.  Patient has been on antibiotic therapy with cefazolin.  Underwent tracheostomy on 8/67, which was complicated by bleeding.      SLP Plan  Continue with current plan of care       Recommendations  Diet recommendations: Regular;Nectar-thick liquid Liquids provided via: Cup;Straw Supervision: Patient able to self feed;Intermittent supervision to cue for compensatory strategies Compensations: Minimize environmental distractions Postural Changes and/or Swallow Maneuvers: Seated upright 90 degrees;Upright 30-60 min  after meal                Oral Care Recommendations: Oral care BID Follow up Recommendations: Inpatient Rehab SLP Visit Diagnosis: Dysphagia, oropharyngeal phase (R13.12) Plan: Continue with current plan of care       GO                Molly [name] 02/27/2019, 4:25 PM    Sonia Baller, MA, [place] [facility] Pager: [phone]

## 2019-02-27 NOTE — Progress Notes (Addendum)
NAME:  Molly [name], MRN:  [id number], DOB:  13-May-1986, LOS: 50 ADMISSION DATE:  01/20/2019, CONSULTATION DATE: 01/20/2019 REFERRING MD:  Bonner Puna, CHIEF COMPLAINT:  Sepsis  Brief History   33 y/o F with a history of IV drug abuse/polysubstance abuse (presented with urine drug screen positive for opiates and amphetamines) and a previous history in June or July 2019 of inadequately treated MRSA endocarditis having signed out AMA at that time.  She had a tricuspid vegetation at that time.  Admitted 4/4 hypotensive and in respiratory distress requiring intubation at [facility].   Admitted for acute respiratory failure/septic shock/ AKI due to septic emboli with MSSA bacteremia.  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 Awake / FC. Looks best in many days. Failed SBT 4/12 Failed SBT.  AKI improving, tx PRBC.  Less alert on precedex, methadone  4/13 More alert, FC. On precedex. Failed SBT. ID recommending PICC for antibiotcs > mother not sure about goals of care. S/p 1 unit PRBC   4/17 Trach placement- 6 cuffed 4/21 Tolerating trach collar trials. 4/23 cdiff result positive overnight with continued diarrhea 4/26 seizure 4/28 status epilepticus 5/7- Tolerating trach collar 5/8- Trach replaced- 6 cuffed 5/12- 2 units PRBCs, trach changed to 4 cuff less  Consults:  Nephrology Neurology  Procedures:  ETT 4/4 >> 4/17 R Femoral line 4/4 >> 4/8 R IJ Tunneled CVC (IR) 4/20 >>  Trach 4/17 >>  Significant  Diagnostic Tests:  Echocardiogram 4/4 >> TV vegetation measures 1.5 mm x 0.6 mm., severe TR CT Chest 4/4 >> bilateral pulmonary nodules, peripheral and cavitated.  LE venous duplex 4/20 >> negative MRI brain 4/26 >> scattered T2/FLAIR most in parieto-occipital region b/l EEG 4/27 >> no evidence of seizures  Micro Data:  Blood cultures x2 4/4 >> MSSA  BCx2 4/8 >> negative   C diff 4/22 >> antigen positive, toxin negative, PCR positive 4/28 urine cx: neg 5/4 trach cx: ecoli, VRE  Antimicrobials:  Vancomycin Zosyn Teflaro >> stopped Ancef 4/4 > 5/5 Diflucan 4/21 >> 4/24 Vancomycin po 4/23 >> 5/5    Cefepime 5/5>> Linezolid 5/7 >>  Interim history/subjective:  Continues on tach collar with no issues Has moderate secretions Transfused 2 units PRBCs for low hemoglobin.  Objective   Blood pressure (!) 127/96, pulse (!) 104, temperature 97.6 F (36.4 C), temperature source Axillary, resp. rate (!) 27, height 5\' 1"  (1.549 m), weight 54.3 kg, SpO2 100 %.    FiO2 (%):  [28 %] 28 %   Intake/Output Summary (Last 24 hours) at 02/27/2019 0815 Last data filed at 02/27/2019 0600 Gross per 24 hour  Intake 1254.58 ml  Output 2850 ml  Net -1595.42 ml   Filed Weights   02/24/19 0500 02/25/19 0343 02/26/19 0500  Weight: 55 kg 54 kg 54.3 kg   Gen:      Thin frail. HEENT:  EOMI, sclera anicteric Neck:     No masses; no thyromegaly, Trachh Lungs:    Clear to auscultation bilaterally; normal respiratory effort CV:  Regular rate and rhythm; no murmurs Abd:      + bowel sounds; soft, non-tender; no palpable masses, no distension Ext:    No edema; adequate peripheral perfusion Skin:      Warm and dry; no rash Neuro: alert and oriented x 3 Psych: normal mood and affect   Resolved Hospital Problem list   Septic Shock, thrush  Active Problem list   New onset seizure 4/26 Status epilepticus 4/28 Continue Keppra per neurology's instructions Dilantin stopped on 02/22/2019 per  neurology's input  MSSA bacteremia with tricuspid valve endocarditis E coli and VRE PNA Finish 7 days of linezolid and cefepime Switch back to Ancef for 7 days per ID.  C. difficile colitis s/p tx  Acute resp failure with hypoxia s/p trach Septic pulmonary emboi Current switch to 4 cuffless as she remains off the vent  Severe protein calorie malnutrition Dysphagia Continue continue tube feeding due to severe malnourishment Continue dysphagia diet in the setting of malnourishment  AKI:  Monitor creatine  Hypokalemia Monitor replete as needed  Metabolic acidosis:  Continue bicarbonate tablets  Anemia of chronic illness Transfuse per protocol  Thrombocytopenia: improving 101->125 Leukopenia 3.5->3.9 Continue to monitor  Best practice:  Diet: tube feeds and PO nectar thick liquids DVT prophylaxis: SQ heparin GI prophylaxis: Protonix Mobility: oob with assistance Code Status: Limited code Disposition: ICU  Labs:   CMP Latest Ref Rng & Units 02/27/2019 02/25/2019 02/24/2019  Glucose 70 - 99 mg/dL 101(H) 113(H) 114(H)  BUN 6 - 20 mg/dL 54(H) 65(H) 71(H)  Creatinine 0.44 - 1.00 mg/dL 1.11(H) 1.23(H) 1.28(H)  Sodium 135 - 145 mmol/L 143 145 146(H)  Potassium 3.5 - 5.1 mmol/L 4.4 4.2 4.0  Chloride 98 - 111 mmol/L 115(H) 119(H) 116(H)  CO2 22 - 32 mmol/L 17(L) 17(L) 15(L)  Calcium 8.9 - 10.3 mg/dL 7.8(L) 7.9(L) 7.9(L)  Total Protein 6.5 - 8.1 g/dL - - -  Total Bilirubin 0.3 - 1.2 mg/dL - - -  Alkaline Phos 38 - 126 U/L - - -  AST 15 - 41 U/L - - -  ALT 0 - 44 U/L - - -   CBC Latest Ref Rng & Units 02/27/2019 02/25/2019 02/24/2019  WBC 4.0 - 10.5 K/uL 4.4 5.1 3.7(L)  Hemoglobin 12.0 - 15.0 g/dL 5.8(LL) 7.1(L) 7.2(L)  Hematocrit 36.0 - 46.0 % 18.8(L) 22.4(L) 22.6(L)  Platelets 150 - 400 K/uL 141(L) 166 147(L)   ABG    Component Value Date/Time   PHART 7.439 02/13/2019 0941   PCO2ART 30.5 (L) 02/13/2019 0941   PO2ART 115.0 (H) 02/13/2019 0941   HCO3 20.7 02/13/2019  0941   TCO2 22 02/13/2019 0941   ACIDBASEDEF 3.0 (H) 02/13/2019 0941   O2SAT 99.0 02/13/2019 0941   The patient is critically ill with multiple organ system failure and requires high complexity decision making for assessment and support, frequent evaluation and titration of therapies, advanced monitoring, review of radiographic studies and interpretation of complex data.   Critical Care Time devoted to patient care services, exclusive of separately billable procedures, described in this note is 35 minutes.   Marshell Garfinkel MD [facility] Pager [phone] If no answer call [phone] 02/27/2019, 8:34 AM

## 2019-02-27 NOTE — Progress Notes (Signed)
Trach changed to #4 uncuffed Shiley.  Placement verified with ETCO2 detector with positive color change.  Pt tolerated procedure well with no signs of distress.  RN @ bedside.

## 2019-02-27 NOTE — Progress Notes (Addendum)
PROGRESS NOTE  Molly [name] JJH:[id number] DOB: May 10, 1986 DOA: 01/20/2019 PCP: Patient, No Pcp Per   LOS: 38 days   Brief narrative: 33 y/o F with a history of IV drug abuse/polysubstance abuse (presented with urine drug screen positive for opiates and amphetamines) and a previous history in June or July 2019 of inadequately treated MRSA endocarditis having signed out AMA at that time.  She had a tricuspid vegetation at that time.  Admitted 4/4 hypotensive and in respiratory distress requiring intubation at [facility].  Admitted for acute respiratory failure/septic shock/ AKI due to septic emboli with MSSA bacteremia.  Significant Hospital Events    4/04 Admit with hypotension / resp distress, intubated  4/05 Family discussion > concern terminal based on sepsis, AKI, septic emboli pancytopenia  4/10 UOP improving, off pressors, low grade fevers  4/11 Agitated, PRN versed. Awake / FC. Looks best in many days. Failed SBT 4/12 Failed SBT.  AKI improving, tx PRBC.  Less alert on precedex, methadone  4/13 More alert, FC. On precedex. Failed SBT. ID recommending PICC for antibiotcs > mother not sure about goals of care. S/p 1 unit PRBC    4/17 Trach placement 4/21 tolerating trach collar trials. 4/23 cdiff result positive overnight with continued diarrhea 4/26 seizure 4/28 status epilepticus  Subjective: Nursing staff reported that the cortrex tube was not functioning.  Patient had one episode of vomiting after taking her medication.  Denies any shortness of breath.  She does have mild lower back pain.  Assessment/Plan:  Principal Problem:   Sepsis with multiple organ dysfunction (MOD) (HCC) Active Problems:   Intravenous drug abuse, continuous (HCC)   Acute respiratory failure (HCC)   Chronic anemia   Polysubstance abuse ([place])   Encounter for orogastric (OG) tube placement   Acute kidney failure ([place])   MSSA bacteremia   Acute septic pulmonary embolism with acute cor pulmonale  (June 2019)   Acute encephalopathy   Thrombocytopenia (HCC)   Sepsis with disseminated intravascular coagulopathy (DIC) ([place])   Sepsis ([place])   Acute cystitis with hematuria   DIC (disseminated intravascular coagulation) ([place])   Multifocal pneumonia   Goals of care, counseling/discussion   Advanced care planning/counseling discussion   Leukopenia   Pressure injury of skin   Status post tracheostomy ([place])  Probably occluded cortrak tube. I spoke with interventional radiology for placing the new cortrak.  Will initiate tube feeding after placement of new cortrak catheter today.    New onset seizure 4/26/ Status epilepticus 4/28, PRES. No further seizures reported.  Keppra as per neurology.  Dilantin was stopped on 02/22/2019.  Will need neurology follow-up as outpatient for discharge.  Anxiety:  PRN Klonopin,controlled.    Essential hypertension Continue on metoprolol.  Will closely monitor.  Diastolic blood pressure seems to be slightly elevated.  MSSA bacteremia with tricuspid valve endocarditis On cefepime and linezolid.  Infectious disease was consulted and appreciated input.  As per the infectious disease recommendation, we will transition to Ancef after completion cefepime and linezolid, total of 7 days.  Linezolid/cefepime day 6 today. Ancef to finish out course of treatment through may 19th.  C. difficile colitis Treated, some loose stool could be from tube feeding.   ecoli/enterococcus pna:  on cefepime and Zyvox day 6  Acute resp failure with hypoxia s/p trach Septic pulmonary emboi Ecoli pna  on cefepime and Zyvox, trach management as per PCCM  Severe protein calorie malnutrition/Dysphagia Continue tube feeding.  Will need new cortrak tube today. Patient is taking clear  liquids.  Acute kidney injury We will closely monitor.  Creatinine 1.1   Non-anion gap metabolic acidosis:  Continue bicarbonate tablets  Anemia of chronic disease.  Hemoglobin 5.8  today.  No bleeding reported but will do stool occult blood.  II units of PRBC today.    Nutrition tube feeding on Jevity.  Dietary on board.  Advance orally as tolerated.  Thrombocytopenia: improving  We will continue to monitor.  Platelet count of 141 today.  VTE Prophylaxis:  Heparin subq-we will hold due to decreasing hemoglobin.  Code Status: Full code  Family Communication: None today.  Unable to reach the patient's mother on the phone.  Disposition Plan: CIR/SNF.  Difficult disposition.  Social worker  on board.  Consultants: Nephrology Neurology   Procedures: ETT 4/4 >> 4/17 R Femoral line 4/4 >> 4/8 R IJ Tunneled CVC (IR) 4/21 >>  Trach 4/17 >>  Antibiotics: Anti-infectives (From admission, onward)   Start     Dose/Rate Route Frequency Ordered Stop   02/27/19 1000  linezolid (ZYVOX) tablet 600 mg     600 mg Oral Every 12 hours 02/27/19 0940     02/22/19 1015  linezolid (ZYVOX) tablet 600 mg  Status:  Discontinued     600 mg Per Tube Every 12 hours 02/22/19 1012 02/27/19 0940   02/22/19 1000  meropenem (MERREM) 2 g in sodium chloride 0.9 % 100 mL IVPB  Status:  Discontinued     2 g 200 mL/hr over 30 Minutes Intravenous Every 12 hours 02/22/19 0853 02/22/19 0934   02/22/19 1000  ceFEPIme (MAXIPIME) 2 g in sodium chloride 0.9 % 100 mL IVPB     2 g 200 mL/hr over 30 Minutes Intravenous Every 12 hours 02/22/19 0935     02/22/19 1000  linezolid (ZYVOX) 100 MG/5ML suspension 600 mg  Status:  Discontinued     600 mg Per Tube Every 12 hours 02/22/19 0938 02/22/19 1008   02/20/19 2200  ceFEPIme (MAXIPIME) 2 g in sodium chloride 0.9 % 100 mL IVPB  Status:  Discontinued     2 g 200 mL/hr over 30 Minutes Intravenous Every 12 hours 02/20/19 1444 02/22/19 0848   02/09/19 1400  ceFAZolin (ANCEF) IVPB 2g/100 mL premix  Status:  Discontinued     2 g 200 mL/hr over 30 Minutes Intravenous Every 8 hours 02/09/19 0902 02/20/19 1444   02/08/19 1000  vancomycin (VANCOCIN) 50 mg/mL  oral solution 125 mg  Status:  Discontinued     125 mg Per Tube 4 times daily 02/08/19 0921 02/20/19 0823   02/06/19 1200  fluconazole (DIFLUCAN) tablet 100 mg     100 mg Oral Daily 02/06/19 1146 02/08/19 0956   01/29/19 1000  ceFAZolin (ANCEF) IVPB 2g/100 mL premix  Status:  Discontinued     2 g 200 mL/hr over 30 Minutes Intravenous Every 12 hours 01/28/19 2145 02/09/19 0902   01/28/19 2200  ceFAZolin (ANCEF) IVPB 1 g/50 mL premix     1 g 100 mL/hr over 30 Minutes Intravenous  Once 01/28/19 2145 01/28/19 2248   01/28/19 2130  ceFAZolin (ANCEF) IVPB 2g/100 mL premix  Status:  Discontinued     2 g 200 mL/hr over 30 Minutes Intravenous Every 12 hours 01/28/19 2121 01/28/19 2145   01/20/19 2000  ceFAZolin (ANCEF) IVPB 1 g/50 mL premix  Status:  Discontinued     1 g 100 mL/hr over 30 Minutes Intravenous Every 24 hours 01/20/19 1831 01/28/19 2121   01/20/19 1400  piperacillin-tazobactam (ZOSYN)  IVPB 2.25 g  Status:  Discontinued    Note to Pharmacy:  Pharmacy to dose   2.25 g 66.7 mL/hr over 30 Minutes Intravenous Every 8 hours 01/20/19 1234 01/20/19 1239   01/20/19 1400  piperacillin-tazobactam (ZOSYN) IVPB 2.25 g  Status:  Discontinued     2.25 g 100 mL/hr over 30 Minutes Intravenous Every 8 hours 01/20/19 1239 01/20/19 1831   01/20/19 0545  ceftaroline (TEFLARO) injection SOLR 300 mg  Status:  Discontinued     300 mg Intravenous  Once 01/20/19 0534 01/20/19 0535   01/20/19 0545  ceftaroline (TEFLARO) 300 mg in sodium chloride 0.9 % 250 mL IVPB     300 mg 250 mL/hr over 60 Minutes Intravenous  Once 01/20/19 0539 01/20/19 0727   01/20/19 0430  vancomycin (VANCOCIN) IVPB 1000 mg/200 mL premix  Status:  Discontinued     1,000 mg 200 mL/hr over 60 Minutes Intravenous  Once 01/20/19 0348 01/20/19 0519   01/20/19 0400  piperacillin-tazobactam (ZOSYN) IVPB 3.375 g     3.375 g 100 mL/hr over 30 Minutes Intravenous  Once 01/20/19 0348 01/20/19 0423     Objective: Vitals:   02/27/19 1200  02/27/19 1220  BP: (!) 131/107 (!) 131/107  Pulse: (!) 102   Resp: (!) 29 (!) 24  Temp:  98.9 F (37.2 C)  SpO2: 100%     Intake/Output Summary (Last 24 hours) at 02/27/2019 1229 Last data filed at 02/27/2019 1220 Gross per 24 hour  Intake 1763.41 ml  Output 2500 ml  Net -736.59 ml   Filed Weights   02/24/19 0500 02/25/19 0343 02/26/19 0500  Weight: 55 kg 54 kg 54.3 kg   Body mass index is 22.62 kg/m.   Physical Exam: General: Thinly built, mildly anxious.  Not in obvious distress. HENT: Normocephalic, pupils equally reacting to light and accommodation.  No scleral pallor or icterus noted. Oral mucosa is moist.  Trach collar in place.  Cortrak tube in place.  Right-sided central venous catheter. Chest:  Clear breath sounds.  Diminished breath sounds bilaterally. No crackles or wheezes.  CVS: S1 &S2 heard. No murmur.  Regular rate and rhythm. Abdomen: Soft, nontender, nondistended.  Bowel sounds are heard.  Liver is not palpable, no abdominal mass palpated.  External urinary catheter in place. Extremities: No cyanosis, clubbing or edema.  Peripheral pulses are palpable. Psych: Alert, awake and oriented, normal mood CNS:  No cranial nerve deficits.  Power equal in all extremities.  No sensory deficits noted.  No cerebellar signs.  Generalized weakness noted.  Skin: Warm and dry.    Data Review: I have personally reviewed the following laboratory data and studies,  CBC: Recent Labs  Lab 02/21/19 0352 02/22/19 0420 02/24/19 0511 02/25/19 0323 02/27/19 0330  WBC 3.4* 3.9* 3.7* 5.1 4.4  NEUTROABS 2.2 2.6  --   --   --   HGB 7.0* 7.1* 7.2* 7.1* 5.8*  HCT 22.5* 22.7* 22.6* 22.4* 18.8*  MCV 92.2 92.3 93.0 94.1 92.2  PLT 101* 125* 147* 166 983*   Basic Metabolic Panel: Recent Labs  Lab 02/21/19 0352 02/22/19 0420 02/24/19 0511 02/25/19 0323 02/27/19 0330  NA 144 144 146* 145 143  K 3.7 3.7 4.0 4.2 4.4  CL 112* 115* 116* 119* 115*  CO2 16* 15* 15* 17* 17*  GLUCOSE  113* 123* 114* 113* 101*  BUN 77* 78* 71* 65* 54*  CREATININE 1.50* 1.39* 1.28* 1.23* 1.11*  CALCIUM 7.8* 7.7* 7.9* 7.9* 7.8*  MG  --   --  1.4* 1.7 1.5*  PHOS  --   --  3.0  --   --    Liver Function Tests: No results for input(s): AST, ALT, ALKPHOS, BILITOT, PROT, ALBUMIN in the last 168 hours. No results for input(s): LIPASE, AMYLASE in the last 168 hours. No results for input(s): AMMONIA in the last 168 hours. Cardiac Enzymes: No results for input(s): CKTOTAL, CKMB, CKMBINDEX, TROPONINI in the last 168 hours. BNP (last 3 results) Recent Labs    04/07/18 2200 04/11/18 0900 04/22/18 1449  BNP 75.2 518.4* 357.0*    ProBNP (last 3 results) No results for input(s): PROBNP in the last 8760 hours.  CBG: Recent Labs  Lab 02/26/19 0746 02/26/19 1546 02/27/19 0024 02/27/19 0751 02/27/19 1127  GLUCAP 103* 94 111* 102* 112*   Recent Results (from the past 240 hour(s))  Culture, respiratory (non-expectorated)     Status: None   Collection Time: 02/18/19 11:38 AM  Result Value Ref Range Status   Specimen Description TRACHEAL ASPIRATE  Final   Special Requests Normal  Final   Gram Stain   Final    ABUNDANT WBC PRESENT, PREDOMINANTLY PMN RARE SQUAMOUS EPITHELIAL CELLS PRESENT RARE GRAM NEGATIVE RODS Performed at [facility], [place] [address], [place], [place] [address]    Culture MODERATE ESCHERICHIA COLI  Final   Report Status 02/20/2019 FINAL  Final   Organism ID, Bacteria ESCHERICHIA COLI  Final      Susceptibility   Escherichia coli - MIC*    AMPICILLIN >=32 RESISTANT Resistant     CEFAZOLIN >=64 RESISTANT Resistant     CEFEPIME <=1 SENSITIVE Sensitive     CEFTAZIDIME <=1 SENSITIVE Sensitive     CEFTRIAXONE <=1 SENSITIVE Sensitive     CIPROFLOXACIN >=4 RESISTANT Resistant     GENTAMICIN <=1 SENSITIVE Sensitive     IMIPENEM <=0.25 SENSITIVE Sensitive     TRIMETH/SULFA >=320 RESISTANT Resistant     AMPICILLIN/SULBACTAM >=32 RESISTANT Resistant     PIP/TAZO  >=128 RESISTANT Resistant     * MODERATE ESCHERICHIA COLI  Culture, respiratory (non-expectorated)     Status: None   Collection Time: 02/19/19 11:08 AM  Result Value Ref Range Status   Specimen Description TRACHEAL ASPIRATE  Final   Special Requests Normal  Final   Gram Stain   Final    MODERATE WBC PRESENT, PREDOMINANTLY PMN RARE GRAM POSITIVE COCCI Performed at [facility], [address] [place], [place] [address]    Culture   Final    FEW ESCHERICHIA COLI FEW VANCOMYCIN RESISTANT ENTEROCOCCUS    Report Status 02/22/2019 FINAL  Final   Organism ID, Bacteria ESCHERICHIA COLI  Final   Organism ID, Bacteria VANCOMYCIN RESISTANT ENTEROCOCCUS  Final      Susceptibility   Escherichia coli - MIC*    AMPICILLIN >=32 RESISTANT Resistant     CEFAZOLIN >=64 RESISTANT Resistant     CEFEPIME <=1 SENSITIVE Sensitive     CEFTAZIDIME <=1 SENSITIVE Sensitive     CEFTRIAXONE <=1 SENSITIVE Sensitive     CIPROFLOXACIN >=4 RESISTANT Resistant     GENTAMICIN <=1 SENSITIVE Sensitive     IMIPENEM <=0.25 SENSITIVE Sensitive     TRIMETH/SULFA >=320 RESISTANT Resistant     AMPICILLIN/SULBACTAM >=32 RESISTANT Resistant     PIP/TAZO >=128 RESISTANT Resistant     Extended ESBL NEGATIVE Sensitive     * FEW ESCHERICHIA COLI   Vancomycin resistant enterococcus - MIC*    AMPICILLIN >=32 RESISTANT Resistant  VANCOMYCIN >=32 RESISTANT Resistant     GENTAMICIN SYNERGY SENSITIVE Sensitive     LINEZOLID 2 SENSITIVE Sensitive     * FEW VANCOMYCIN RESISTANT ENTEROCOCCUS  MRSA PCR Screening     Status: None   Collection Time: 02/22/19 11:26 AM  Result Value Ref Range Status   MRSA by PCR NEGATIVE NEGATIVE Final    Comment:        The GeneXpert MRSA Assay (FDA approved for NASAL specimens only), is one component of a comprehensive MRSA colonization surveillance program. It is not intended to diagnose MRSA infection nor to guide or monitor treatment for MRSA infections. Performed at [facility], [place] [address], [place], [place] [address]      Studies: No results found.  Scheduled Meds: . sodium chloride   Intravenous Once  . acetaminophen  650 mg Oral Q6H  . chlorhexidine  15 mL Mouth Rinse BID  . Chlorhexidine Gluconate Cloth  6 each Topical Daily  . feeding supplement (ENSURE ENLIVE)  237 mL Oral TID BM  . feeding supplement (PRO-STAT SUGAR FREE 64)  30 mL Oral Daily  . fentaNYL  1 patch Transdermal Q72H  . free water  100 mL Per Tube Q4H  . heparin injection (subcutaneous)  5,000 Units Subcutaneous Q12H  . levETIRAcetam  1,000 mg Oral Q12H  . linezolid  600 mg Oral Q12H  . mouth rinse  15 mL Mouth Rinse q12n4p  . metoprolol tartrate  12.5 mg Oral BID  . midazolam  2 mg Intravenous Once  . multivitamin  15 mL Oral Daily  . pantoprazole  40 mg Oral Daily  . sodium chloride flush  10-40 mL Intracatheter Q12H  . vitamin C  250 mg Oral BID    Continuous Infusions: . sodium chloride 5 mL/hr at 02/27/19 1200  . ceFEPime (MAXIPIME) IV Stopped (02/27/19 1102)  . feeding supplement (JEVITY 1.2 CAL) 1,000 mL (02/26/19 1524)     Flora Lipps, MD  [facility] 02/27/2019

## 2019-02-27 NOTE — Progress Notes (Signed)
CRITICAL VALUE ALERT  Critical Value:  Hgb 5.8  Date & Time Notified:  02/27/2019 03:51AM  Provider Notified: Althea Grimmer  Orders Received/Actions taken: Awaiting new orders.  Clint Bolder, RN 02/27/19 3:51 AM

## 2019-02-28 DIAGNOSIS — I368 Other nonrheumatic tricuspid valve disorders: Secondary | ICD-10-CM

## 2019-02-28 LAB — BPAM RBC
Blood Product Expiration Date: 202005142359
Blood Product Expiration Date: 202005142359
ISSUE DATE / TIME: 202005120604
ISSUE DATE / TIME: 202005120910
Unit Type and Rh: 9500
Unit Type and Rh: 9500

## 2019-02-28 LAB — BASIC METABOLIC PANEL
Anion gap: 9 (ref 5–15)
BUN: 45 mg/dL — ABNORMAL HIGH (ref 6–20)
CO2: 17 mmol/L — ABNORMAL LOW (ref 22–32)
Calcium: 7.9 mg/dL — ABNORMAL LOW (ref 8.9–10.3)
Chloride: 111 mmol/L (ref 98–111)
Creatinine, Ser: 1.25 mg/dL — ABNORMAL HIGH (ref 0.44–1.00)
GFR calc Af Amer: >60 mL/min (ref >60)
GFR calc non Af Amer: 57 mL/min — ABNORMAL LOW (ref >60)
Glucose, Bld: 110 mg/dL — ABNORMAL HIGH (ref 70–99)
Potassium: 4.7 mmol/L (ref 3.5–5.1)
Sodium: 137 mmol/L (ref 135–145)

## 2019-02-28 LAB — TYPE AND SCREEN
ABO/RH(D): O NEG
Antibody Screen: NEGATIVE
Unit division: 0
Unit division: 0

## 2019-02-28 LAB — CBC
HCT: 27.6 % — ABNORMAL LOW (ref 36.0–46.0)
Hemoglobin: 8.8 g/dL — ABNORMAL LOW (ref 12.0–15.0)
MCH: 29.8 pg (ref 26.0–34.0)
MCHC: 31.9 g/dL (ref 30.0–36.0)
MCV: 93.6 fL (ref 80.0–100.0)
Platelets: 159 10*3/uL (ref 150–400)
RBC: 2.95 MIL/uL — ABNORMAL LOW (ref 3.87–5.11)
RDW: 18 % — ABNORMAL HIGH (ref 11.5–15.5)
WBC: 6.8 10*3/uL (ref 4.0–10.5)
nRBC: 0 % (ref 0.0–0.2)

## 2019-02-28 LAB — GLUCOSE, CAPILLARY
Glucose-Capillary: 108 mg/dL — ABNORMAL HIGH (ref 70–99)
Glucose-Capillary: 89 mg/dL (ref 70–99)
Glucose-Capillary: 94 mg/dL (ref 70–99)

## 2019-02-28 LAB — MAGNESIUM: Magnesium: 1.7 mg/dL (ref 1.7–2.4)

## 2019-02-28 MED ORDER — METOPROLOL TARTRATE 12.5 MG HALF TABLET
12.5000 mg | ORAL_TABLET | Freq: Two times a day (BID) | ORAL | Status: DC
  Administered 2019-02-28 – 2019-03-02 (×4): 12.5 mg via ORAL
  Filled 2019-02-28 (×4): qty 1

## 2019-02-28 MED ORDER — CEFAZOLIN SODIUM-DEXTROSE 2-4 GM/100ML-% IV SOLN
2.0000 g | Freq: Three times a day (TID) | INTRAVENOUS | Status: DC
  Administered 2019-03-01 – 2019-03-06 (×15): 2 g via INTRAVENOUS
  Filled 2019-02-28 (×18): qty 100

## 2019-02-28 MED ORDER — LEVETIRACETAM ER 500 MG PO TB24
1000.0000 mg | ORAL_TABLET | Freq: Two times a day (BID) | ORAL | Status: DC
  Administered 2019-02-28 – 2019-03-05 (×10): 1000 mg via ORAL
  Filled 2019-02-28 (×13): qty 2

## 2019-02-28 NOTE — Progress Notes (Signed)
Patient agreed to let us place her on the progressive care monitor, but still states she wants to go home. Patient says she will stay tonight if we get her something to eat soon. Tech preparing frozen dinner for patient at this time.   Patient is refusing tube feeds and will not allow this RN to start the tube feeds.  Will continue to monitor.

## 2019-02-28 NOTE — Progress Notes (Signed)
SLP Cancellation Note  Patient Details Name: Margaretta Chittum MRN: [id number] DOB: November 18, 1985   Cancelled treatment:       Reason Eval/Treat Not Completed: Patient's level of consciousness. SLP attempted to see patient this AM but she was sleeping in bed and only opened eyes briefly when named called. Will attempt next date.   Nadara Mode Tarrell 02/28/2019, 4:42 PM   Sonia Baller, MA, CCC-SLP Speech Therapy [facility] Pager: [phone]

## 2019-02-28 NOTE — Progress Notes (Signed)
PROGRESS NOTE  Molly [name] ZOX:[id number] DOB: 1986/05/20 DOA: 01/20/2019 PCP: Patient, No Pcp Per   LOS: 39 days   Patient is from: Home  Brief Narrative / Interim history: 33 y/o F with a history of IV drug abuse/polysubstance abuse (presented with urine drug screen positive for opiates and amphetamines) and a previous history in June or July 2019 of inadequately treated MRSA endocarditis having signed out AMA at that time. She had a tricuspid vegetation at that time. Admitted 4/4 hypotensive and in respiratory distress requiring intubation at [facility] long. Admitted for acute respiratory failure/septic shock/ AKI due to septic emboli with MSSA bacteremia.  Significant events 4/04 Admit with hypotension / resp distress, intubated  4/05 Family discussion >concern terminal based on sepsis, AKI, septic emboli pancytopenia  4/10 UOP improving, off pressors, low grade fevers  4/11 Agitated, PRN versed. Awake / FC. Looks best in many days. Failed SBT 4/12 Failed SBT. AKI improving, tx PRBC. Less alert on precedex, methadone  4/13 More alert, FC. On precedex. Failed SBT. ID recommending PICC for antibiotcs >mother not sure about goals of care. S/p 1 unit PRBC   4/17 Trach placement 4/21 tolerating trach collar trials. 4/23 cdiff result positive overnight with continued diarrhea 4/26 seizure 4/28 status epilepticus  5/7- Tolerating trach collar  5/8- Trach replaced- 6 cuffed  5/12- 2 units PRBCs, trach changed to 4 cuff less  Subjective: No major events overnight of this morning.  Complains about neck and back pain.  She asks when she will be released.  Also asking about insurance.  Communicating by sign and writing.  Denies shortness of breath.  Tolerating feeding  Assessment & Plan: MSSA bacteremia with tricuspid valve endocarditis and septic pulmonary emboli E. coli pneumonia -Appreciate infectious disease input -Cefepime and linezolid 5/6-5/13, and ancef through 03/06/2019 per  ID  Tachycardia: Heart rate in 110s. Could be due to anemia and anxiety -We will check thyroid panel although limited utility in acute setting  Respiratory failure with hypoxia status post trach -Trach management per PCCM  AKI: Serum creatinine 7.12 on admission.  Unknown baseline.  Likely due to septic shock.  Improved -We will monitor PRN  Severe protein calorie malnutrition/dysphagia -Tolerating oral and tube feeding -Appreciate nutrition and SLP input -Continue multivitamin  C. difficile colitis -Completed treatment  Seizure activity/status epilepticus/PRES -Seizure-like activity on 4/26 and status epilepticus on 4/28 -No further seizures -Keppra per neurology -Dilantin discontinued 5/7 -Outpatient neurology follow-up  Anemia of chronic disease/positive Hemoccult: Hemoglobin 5.8 on 5/12 partly dilutional.  Hemoccult positive.  -2 units on 5/12.  Posttransfusion Hgb 8.8. -We will continue monitoring -GI consult if further drop.  Thrombocytopenia: Resolved  Essential hypertension: Blood pressure fairly controlled -Continue home medications  Anxiety -Continue Klonopin  Scheduled Meds: . sodium chloride   Intravenous Once  . acetaminophen  650 mg Oral Q6H  . chlorhexidine  15 mL Mouth Rinse BID  . Chlorhexidine Gluconate Cloth  6 each Topical Daily  . feeding supplement (ENSURE ENLIVE)  237 mL Oral TID BM  . feeding supplement (PRO-STAT SUGAR FREE 64)  30 mL Oral Daily  . fentaNYL  1 patch Transdermal Q72H  . free water  100 mL Per Tube Q4H  . levETIRAcetam  1,000 mg Oral Q12H  . linezolid  600 mg Oral Q12H  . mouth rinse  15 mL Mouth Rinse q12n4p  . metoprolol tartrate  12.5 mg Oral BID  . multivitamin  15 mL Oral Daily  . pantoprazole  40 mg Oral Daily  .  sodium chloride flush  10-40 mL Intracatheter Q12H  . vitamin C  250 mg Oral BID   Continuous Infusions: . sodium chloride 10 mL/hr at 02/28/19 0700  . ceFEPime (MAXIPIME) IV Stopped (02/27/19 2152)  .  feeding supplement (JEVITY 1.2 CAL) 60 mL/hr at 02/28/19 0300   PRN Meds:.sodium chloride, clonazepam, guaiFENesin, labetalol, LORazepam, [DISCONTINUED] ondansetron **OR** ondansetron (ZOFRAN) IV, Resource ThickenUp Clear, sodium chloride, sodium chloride flush   DVT prophylaxis: SCD Code Status: Partial Family Communication: Attempted to call patient's mother but no answer.  Did not leave voicemail. Disposition Plan: Transfer to progressive unit.  Final disposition SNF/LTAC  Consultants:   PCCM  ID  Neurology  Palliative  Nephrology  Procedures:  ETT 4/4 >> 4/17 R Femoral line 4/4 >> 4/8 R IJ Tunneled CVC (IR) 4/21 >> Trach 4/17 >>  Microbiology: . Blood culture MSSA on 4/4 . Urine culture MSSA on 4/4 . Blood culture on 4/8-negative . Respiratory culture on 5/4 with multidrug-resistant E. coli  Antimicrobials: Anti-infectives (From admission, onward)   Start     Dose/Rate Route Frequency Ordered Stop   02/27/19 1000  linezolid (ZYVOX) tablet 600 mg     600 mg Oral Every 12 hours 02/27/19 0940     02/22/19 1015  linezolid (ZYVOX) tablet 600 mg  Status:  Discontinued     600 mg Per Tube Every 12 hours 02/22/19 1012 02/27/19 0940   02/22/19 1000  meropenem (MERREM) 2 g in sodium chloride 0.9 % 100 mL IVPB  Status:  Discontinued     2 g 200 mL/hr over 30 Minutes Intravenous Every 12 hours 02/22/19 0853 02/22/19 0934   02/22/19 1000  ceFEPIme (MAXIPIME) 2 g in sodium chloride 0.9 % 100 mL IVPB     2 g 200 mL/hr over 30 Minutes Intravenous Every 12 hours 02/22/19 0935     02/22/19 1000  linezolid (ZYVOX) 100 MG/5ML suspension 600 mg  Status:  Discontinued     600 mg Per Tube Every 12 hours 02/22/19 0938 02/22/19 1008   02/20/19 2200  ceFEPIme (MAXIPIME) 2 g in sodium chloride 0.9 % 100 mL IVPB  Status:  Discontinued     2 g 200 mL/hr over 30 Minutes Intravenous Every 12 hours 02/20/19 1444 02/22/19 0848   02/09/19 1400  ceFAZolin (ANCEF) IVPB 2g/100 mL premix  Status:   Discontinued     2 g 200 mL/hr over 30 Minutes Intravenous Every 8 hours 02/09/19 0902 02/20/19 1444   02/08/19 1000  vancomycin (VANCOCIN) 50 mg/mL oral solution 125 mg  Status:  Discontinued     125 mg Per Tube 4 times daily 02/08/19 0921 02/20/19 0823   02/06/19 1200  fluconazole (DIFLUCAN) tablet 100 mg     100 mg Oral Daily 02/06/19 1146 02/08/19 0956   01/29/19 1000  ceFAZolin (ANCEF) IVPB 2g/100 mL premix  Status:  Discontinued     2 g 200 mL/hr over 30 Minutes Intravenous Every 12 hours 01/28/19 2145 02/09/19 0902   01/28/19 2200  ceFAZolin (ANCEF) IVPB 1 g/50 mL premix     1 g 100 mL/hr over 30 Minutes Intravenous  Once 01/28/19 2145 01/28/19 2248   01/28/19 2130  ceFAZolin (ANCEF) IVPB 2g/100 mL premix  Status:  Discontinued     2 g 200 mL/hr over 30 Minutes Intravenous Every 12 hours 01/28/19 2121 01/28/19 2145   01/20/19 2000  ceFAZolin (ANCEF) IVPB 1 g/50 mL premix  Status:  Discontinued     1 g 100 mL/hr over  30 Minutes Intravenous Every 24 hours 01/20/19 1831 01/28/19 2121   01/20/19 1400  piperacillin-tazobactam (ZOSYN) IVPB 2.25 g  Status:  Discontinued    Note to Pharmacy:  Pharmacy to dose   2.25 g 66.7 mL/hr over 30 Minutes Intravenous Every 8 hours 01/20/19 1234 01/20/19 1239   01/20/19 1400  piperacillin-tazobactam (ZOSYN) IVPB 2.25 g  Status:  Discontinued     2.25 g 100 mL/hr over 30 Minutes Intravenous Every 8 hours 01/20/19 1239 01/20/19 1831   01/20/19 0545  ceftaroline (TEFLARO) injection SOLR 300 mg  Status:  Discontinued     300 mg Intravenous  Once 01/20/19 0534 01/20/19 0535   01/20/19 0545  ceftaroline (TEFLARO) 300 mg in sodium chloride 0.9 % 250 mL IVPB     300 mg 250 mL/hr over 60 Minutes Intravenous  Once 01/20/19 0539 01/20/19 0727   01/20/19 0430  vancomycin (VANCOCIN) IVPB 1000 mg/200 mL premix  Status:  Discontinued     1,000 mg 200 mL/hr over 60 Minutes Intravenous  Once 01/20/19 0348 01/20/19 0519   01/20/19 0400  piperacillin-tazobactam  (ZOSYN) IVPB 3.375 g     3.375 g 100 mL/hr over 30 Minutes Intravenous  Once 01/20/19 0348 01/20/19 0423       Objective: Vitals:   02/28/19 0600 02/28/19 0700 02/28/19 0747 02/28/19 0836  BP: (!) 133/109 (!) 131/96  (!) 130/109  Pulse: (!) 112 (!) 114  (!) 109  Resp: (!) 25 (!) 28  (!) 21  Temp:   98.9 F (37.2 C)   TempSrc:   Oral   SpO2: 100% 100%  100%  Weight:      Height:        Intake/Output Summary (Last 24 hours) at 02/28/2019 0942 Last data filed at 02/28/2019 0700 Gross per 24 hour  Intake 2655.34 ml  Output 1850 ml  Net 805.34 ml   Filed Weights   02/25/19 0343 02/26/19 0500 02/28/19 0500  Weight: 54 kg 54.3 kg 55 kg    Examination:  GENERAL: No acute distress.  Appears well.  HEENT: MMM.  Vision and hearing grossly intact.  NECK: Trach collar in place. LUNGS:  No IWOB. Good air movement bilaterally. HEART: Tachycardic to 110s.  Regular rhythm. S1 and S2 audible. ABD: Bowel sounds present. Soft. Non tender.  MSK/EXT:  Moves all extremities. No apparent deformity. No edema bilaterally.  SKIN: no apparent skin lesion or wound NEURO: Awake, alert and oriented appropriately.  No gross deficit.  PSYCH: Calm. Normal affect.    Data Reviewed: I have independently reviewed following labs and imaging studies  CBC: Recent Labs  Lab 02/22/19 0420 02/24/19 0511 02/25/19 0323 02/27/19 0330 02/28/19 0517  WBC 3.9* 3.7* 5.1 4.4 6.8  NEUTROABS 2.6  --   --   --   --   HGB 7.1* 7.2* 7.1* 5.8* 8.8*  HCT 22.7* 22.6* 22.4* 18.8* 27.6*  MCV 92.3 93.0 94.1 92.2 93.6  PLT 125* 147* 166 141* 672   Basic Metabolic Panel: Recent Labs  Lab 02/22/19 0420 02/24/19 0511 02/25/19 0323 02/27/19 0330 02/28/19 0907  NA 144 146* 145 143 137  K 3.7 4.0 4.2 4.4 4.7  CL 115* 116* 119* 115* 111  CO2 15* 15* 17* 17* 17*  GLUCOSE 123* 114* 113* 101* 110*  BUN 78* 71* 65* 54* 45*  CREATININE 1.39* 1.28* 1.23* 1.11* 1.25*  CALCIUM 7.7* 7.9* 7.9* 7.8* 7.9*  MG  --  1.4*  1.7 1.5* 1.7  PHOS  --  3.0  --   --   --  GFR: Estimated Creatinine Clearance: 49.2 mL/min (A) (by C-G formula based on SCr of 1.25 mg/dL (H)). Liver Function Tests: No results for input(s): AST, ALT, ALKPHOS, BILITOT, PROT, ALBUMIN in the last 168 hours. No results for input(s): LIPASE, AMYLASE in the last 168 hours. No results for input(s): AMMONIA in the last 168 hours. Coagulation Profile: No results for input(s): INR, PROTIME in the last 168 hours. Cardiac Enzymes: No results for input(s): CKTOTAL, CKMB, CKMBINDEX, TROPONINI in the last 168 hours. BNP (last 3 results) No results for input(s): PROBNP in the last 8760 hours. HbA1C: No results for input(s): HGBA1C in the last 72 hours. CBG: Recent Labs  Lab 02/27/19 0751 02/27/19 1127 02/27/19 1726 02/28/19 0033 02/28/19 0743  GLUCAP 102* 112* 92 108* 94   Lipid Profile: No results for input(s): CHOL, HDL, LDLCALC, TRIG, CHOLHDL, LDLDIRECT in the last 72 hours. Thyroid Function Tests: No results for input(s): TSH, T4TOTAL, FREET4, T3FREE, THYROIDAB in the last 72 hours. Anemia Panel: No results for input(s): VITAMINB12, FOLATE, FERRITIN, TIBC, IRON, RETICCTPCT in the last 72 hours. Urine analysis:    Component Value Date/Time   COLORURINE AMBER (A) 01/20/2019 0252   APPEARANCEUR CLOUDY (A) 01/20/2019 0252   LABSPEC 1.020 01/20/2019 0252   PHURINE 5.0 01/20/2019 0252   GLUCOSEU NEGATIVE 01/20/2019 0252   HGBUR LARGE (A) 01/20/2019 0252   BILIRUBINUR NEGATIVE 01/20/2019 0252   KETONESUR NEGATIVE 01/20/2019 0252   PROTEINUR 100 (A) 01/20/2019 0252   NITRITE NEGATIVE 01/20/2019 0252   LEUKOCYTESUR TRACE (A) 01/20/2019 0252   Sepsis Labs: Invalid input(s): PROCALCITONIN, LACTICIDVEN  Recent Results (from the past 240 hour(s))  Culture, respiratory (non-expectorated)     Status: None   Collection Time: 02/18/19 11:38 AM  Result Value Ref Range Status   Specimen Description TRACHEAL ASPIRATE  Final   Special  Requests Normal  Final   Gram Stain   Final    ABUNDANT WBC PRESENT, PREDOMINANTLY PMN RARE SQUAMOUS EPITHELIAL CELLS PRESENT RARE GRAM NEGATIVE RODS Performed at [facility], [address] [place], [place] [address]    Culture MODERATE ESCHERICHIA COLI  Final   Report Status 02/20/2019 FINAL  Final   Organism ID, Bacteria ESCHERICHIA COLI  Final      Susceptibility   Escherichia coli - MIC*    AMPICILLIN >=32 RESISTANT Resistant     CEFAZOLIN >=64 RESISTANT Resistant     CEFEPIME <=1 SENSITIVE Sensitive     CEFTAZIDIME <=1 SENSITIVE Sensitive     CEFTRIAXONE <=1 SENSITIVE Sensitive     CIPROFLOXACIN >=4 RESISTANT Resistant     GENTAMICIN <=1 SENSITIVE Sensitive     IMIPENEM <=0.25 SENSITIVE Sensitive     TRIMETH/SULFA >=320 RESISTANT Resistant     AMPICILLIN/SULBACTAM >=32 RESISTANT Resistant     PIP/TAZO >=128 RESISTANT Resistant     * MODERATE ESCHERICHIA COLI  Culture, respiratory (non-expectorated)     Status: None   Collection Time: 02/19/19 11:08 AM  Result Value Ref Range Status   Specimen Description TRACHEAL ASPIRATE  Final   Special Requests Normal  Final   Gram Stain   Final    MODERATE WBC PRESENT, PREDOMINANTLY PMN RARE GRAM POSITIVE COCCI Performed at [facility], [address] [place], [place] [address]    Culture   Final    FEW ESCHERICHIA COLI FEW VANCOMYCIN RESISTANT ENTEROCOCCUS    Report Status 02/22/2019 FINAL  Final   Organism ID, Bacteria ESCHERICHIA COLI  Final   Organism ID, Bacteria VANCOMYCIN RESISTANT  ENTEROCOCCUS  Final      Susceptibility   Escherichia coli - MIC*    AMPICILLIN >=32 RESISTANT Resistant     CEFAZOLIN >=64 RESISTANT Resistant     CEFEPIME <=1 SENSITIVE Sensitive     CEFTAZIDIME <=1 SENSITIVE Sensitive     CEFTRIAXONE <=1 SENSITIVE Sensitive     CIPROFLOXACIN >=4 RESISTANT Resistant     GENTAMICIN <=1 SENSITIVE Sensitive     IMIPENEM <=0.25 SENSITIVE Sensitive     TRIMETH/SULFA >=320 RESISTANT Resistant      AMPICILLIN/SULBACTAM >=32 RESISTANT Resistant     PIP/TAZO >=128 RESISTANT Resistant     Extended ESBL NEGATIVE Sensitive     * FEW ESCHERICHIA COLI   Vancomycin resistant enterococcus - MIC*    AMPICILLIN >=32 RESISTANT Resistant     VANCOMYCIN >=32 RESISTANT Resistant     GENTAMICIN SYNERGY SENSITIVE Sensitive     LINEZOLID 2 SENSITIVE Sensitive     * FEW VANCOMYCIN RESISTANT ENTEROCOCCUS  MRSA PCR Screening     Status: None   Collection Time: 02/22/19 11:26 AM  Result Value Ref Range Status   MRSA by PCR NEGATIVE NEGATIVE Final    Comment:        The GeneXpert MRSA Assay (FDA approved for NASAL specimens only), is one component of a comprehensive MRSA colonization surveillance program. It is not intended to diagnose MRSA infection nor to guide or monitor treatment for MRSA infections. Performed at [facility], [place] [address] [place], [place] [address]       Radiology Studies: Dg Abd 1 View  Result Date: 02/27/2019 CLINICAL DATA:  Feeding tube replacement. EXAM: ABDOMEN - 1 VIEW; DG NASO G TUBE PLC W/FL-NO RAD COMPARISON:  Abdominal x-ray dated February 06, 2019. FLUOROSCOPY TIME:  8 minutes, 24 seconds. FINDINGS: Feeding tube tip near the ligament of Treitz with contrast opacification of small bowel. IMPRESSION: Successful feeding tube replacement with tip near the ligament of Treitz. Electronically Signed   By: Titus Dubin M.D.   On: 02/27/2019 16:53   Dg Addison Bailey G Tube Plc W/fl-no Rad  Result Date: 02/27/2019 CLINICAL DATA:  Feeding tube replacement. EXAM: ABDOMEN - 1 VIEW; DG NASO G TUBE PLC W/FL-NO RAD COMPARISON:  Abdominal x-ray dated February 06, 2019. FLUOROSCOPY TIME:  8 minutes, 24 seconds. FINDINGS: Feeding tube tip near the ligament of Treitz with contrast opacification of small bowel. IMPRESSION: Successful feeding tube replacement with tip near the ligament of Treitz. Electronically Signed   By: Titus Dubin M.D.   On: 02/27/2019 16:53    35 minutes  with more than 50% spent in reviewing records, counseling patient and coordinating care.   Swade Shonka T. [facility] Pager [phone]  If 7PM-7AM, please contact night-coverage [email or web address] Password [facility] 02/28/2019, 9:42 AM

## 2019-02-28 NOTE — Progress Notes (Signed)
Patient transferred from 31m. Patient is refusing progressive care monitor. I explained to the patient the importance of the monitor. Patient states she has an understanding of the monitor and its purpose. States she does not want it and wants to go home.

## 2019-02-28 NOTE — Progress Notes (Signed)
Occupational Therapy Treatment Patient Details Name: Molly [name] MRN: [id number] DOB: [birth year] Today's Date: 02/28/2019    History of present illness Pt adm for acute respiratory failure due to Septic pulmonary embolus due to MRSA tricuspid vegetation. Pt intubated 01/20/19. Pt with multiple seizures 4/28-4/29. PMH - polysubstance abuse, inadequately treated MRSA endocarditis having signed out AMA  in July 2019, osteomyelitis of spine   OT comments  Pt progressing with therapy. Pt has been downsized to size #4 trach and is starting to talk. Pt maxA for bed mobility and modA for squat pivot transfer. Pt able to take a few guided steps with transfers. Pt sitting up in recliner. O2 sats 100% on RA. Pt sipping on thickened drink provided. RN about to take pt to a stepdown unit. Pt would greatly benefit from continued OT skilled services for ADL, mobility and safety in CIR vs [place] setting. OT to follow acutely.    Follow Up Recommendations  CIR;Supervision/Assistance - 24 hour    Equipment Recommendations  Other (comment)(to be determined)    Recommendations for Other Services      Precautions / Restrictions Precautions Precautions: Fall Precaution Comments: trach, cortrak Restrictions Weight Bearing Restrictions: No       Mobility Bed Mobility Overal bed mobility: Needs Assistance Bed Mobility: Supine to Sit;Sit to Supine;Rolling Rolling: Min assist   Supine to sit: Max assist     General bed mobility comments: assist for elevation of trunk to upright and LEs to EOB. pt grimacing and reporting back pain upon sitting  Transfers Overall transfer level: Needs assistance Equipment used: 1 person hand held assist Transfers: Squat Pivot Transfers Sit to Stand: Mod assist   Squat pivot transfers: Mod assist     General transfer comment: Mod assist for power up to standing with face to face positioning, assist for support during transitional movement from bed to chair.     Balance     Sitting balance-Leahy Scale: Fair Sitting balance - Comments: sat x 5 minutes with mod assist initially, progressed to min assist Postural control: Posterior lean Standing balance support: Bilateral upper extremity supported Standing balance-Leahy Scale: Poor                             ADL either performed or assessed with clinical judgement   ADL Overall ADL's : Needs assistance/impaired                                     Functional mobility during ADLs: Maximal assistance General ADL Comments: Pt sitting EOB and transferred to recliner to drink her drink with set-upA     Vision   Vision Assessment?: Vision impaired- to be further tested in functional context   Perception     Praxis      Cognition Arousal/Alertness: Awake/alert Behavior During Therapy: Flat affect Overall Cognitive Status: Difficult to assess                         Following Commands: Follows one step commands consistently   Awareness: Intellectual            Exercises General Exercises - Lower Extremity Ankle Circles/Pumps: AROM;Both;10 reps;Supine   Shoulder Instructions       General Comments 100% O2 on RA    Pertinent Vitals/ Pain       Pain Assessment: Faces Faces  Pain Scale: Hurts little more Pain Location: low back with bed mobility Pain Descriptors / Indicators: Discomfort Pain Intervention(s): Repositioned  Home Living                                          Prior Functioning/Environment              Frequency  Min 3X/week        Progress Toward Goals  OT Goals(current goals can now be found in the care plan section)  Progress towards OT goals: Progressing toward goals  Acute Rehab OT Goals Patient Stated Goal: Pt unable OT Goal Formulation: With patient Time For Goal Achievement: 03/14/19 Potential to Achieve Goals: Fair ADL Goals Pt Will Perform Grooming: with set-up;with  supervision;sitting Pt Will Perform Upper Body Dressing: with set-up;with supervision;sitting Pt Will Perform Lower Body Dressing: with set-up;with supervision;sit to/from stand Pt Will Transfer to Toilet: with set-up;with supervision;ambulating Additional ADL Goal #1: Pt will peform bed mobility with supervision in preparation for ADLs  Plan Discharge plan remains appropriate    Co-evaluation                 AM-PAC OT "6 Clicks" Daily Activity     Outcome Measure   Help from another person eating meals?: A Little Help from another person taking care of personal grooming?: A Lot Help from another person toileting, which includes using toliet, bedpan, or urinal?: Total Help from another person bathing (including washing, rinsing, drying)?: Total Help from another person to put on and taking off regular upper body clothing?: A Lot Help from another person to put on and taking off regular lower body clothing?: Total 6 Click Score: 10    End of Session Equipment Utilized During Treatment: Oxygen  OT Visit Diagnosis: Other abnormalities of gait and mobility (R26.89);Unsteadiness on feet (R26.81);Muscle weakness (generalized) (M62.81);Other symptoms and signs involving cognitive function   Activity Tolerance Patient limited by fatigue   Patient Left in chair;with call bell/phone within reach;with chair alarm set   Nurse Communication Mobility status        Time: 7616-0737 OT Time Calculation (min): 30 min  Charges: OT General Charges $OT Visit: 1 Visit OT Treatments $Therapeutic Activity: 8-22 mins $Neuromuscular Re-education: 8-22 mins  Darryl Nestle) Marsa Aris OTR/L Acute Rehabilitation Services Pager: [phone] Office: [place] 02/28/2019, 5:23 PM

## 2019-03-01 LAB — HEMOGLOBIN AND HEMATOCRIT, BLOOD
HCT: 26.1 % — ABNORMAL LOW (ref 36.0–46.0)
Hemoglobin: 8.3 g/dL — ABNORMAL LOW (ref 12.0–15.0)

## 2019-03-01 LAB — GLUCOSE, CAPILLARY: Glucose-Capillary: 99 mg/dL (ref 70–99)

## 2019-03-01 LAB — TSH: TSH: 5.552 u[IU]/mL — ABNORMAL HIGH (ref 0.350–4.500)

## 2019-03-01 LAB — T4, FREE: Free T4: 0.72 ng/dL — ABNORMAL LOW (ref 0.82–1.77)

## 2019-03-01 MED ORDER — ADULT MULTIVITAMIN W/MINERALS CH
1.0000 | ORAL_TABLET | Freq: Every day | ORAL | Status: DC
  Administered 2019-03-02 – 2019-03-05 (×4): 1 via ORAL
  Filled 2019-03-01 (×5): qty 1

## 2019-03-01 NOTE — Progress Notes (Signed)
  Speech Language Pathology Treatment: Nada Boozer Speaking valve  Patient Details Name: Molly [name] MRN: [id number] DOB: 11-27-1985 Today's Date: 03/01/2019 Time: 6269-4854 SLP Time Calculation (min) (ACUTE ONLY): 30 min  Assessment / Plan / Recommendation Clinical Impression  Patient seen to address speech/voice goals with PMV in place. Patient had her trach downsized from #6 to #4 cuffless on 5/12. Patient was achieving voicing around trach and with PMV placement, voicing was stronger. RR was initially 30 but averaged out to 25 overall during 25 minute trial, SpO2 remained at 100% throughout session. No CO2 trapping observed, patient did not exhibit any overt s/s of increase WOB and patient did not complain of any discomfort or anxiety with PMV in place. SLP changed PMV usage to all waking hours with intermittent supervision. Patient was agitated and stating "Im going home today.Marland KitchenMarland KitchenAMA!" She called her Uncle on her cell phone and was telling him that she was ready to leave and was giving him her room number so he could come and pick her up. RN discussed further with patient, and RN then spoke with patient's attending MD.   HPI HPI: Molly [name] 33 year old female who presented to the emergency department with altered mental status.  Patient was admitted to the hospital with working diagnosis of sepsis with multiorgan failure and possible DIC.  Patient condition deteriorated and required invasive mechanical ventilation 4/4.  CT of the chest was done which was positive for septic emboli and right base pneumonia.  Blood cultures grew MSSA.  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  Patient has history of polysubstance/IV drug abuse.  Patient has been on antibiotic therapy with cefazolin.  Underwent tracheostomy on 6/27, which was complicated by bleeding. Patient;s trach was downsized to a #4 cuffless on 5/12.      SLP Plan  Continue with current plan of care        Recommendations         Patient may use Passy-Muir Speech Valve: During all waking hours (remove during sleep) PMSV Supervision: Intermittent         Oral Care Recommendations: Oral care BID Follow up Recommendations: Inpatient Rehab SLP Visit Diagnosis: Aphonia (R49.1) Plan: Continue with current plan of care       GO                Dannial Monarch 03/01/2019, 12:23 PM  Sonia Baller, MA, [place] [facility] Pager: [phone]

## 2019-03-01 NOTE — Progress Notes (Signed)
PROGRESS NOTE  Loralye Loberg KTG:[id number] DOB: 1986/09/19 DOA: 01/20/2019 PCP: Patient, No Pcp Per   LOS: 40 days   Patient is from: Home  Brief Narrative / Interim history: 33 y/o F with a history of IV drug abuse/polysubstance abuse (presented with urine drug screen positive for opiates and amphetamines) and a previous history in June or July 2019 of inadequately treated MRSA endocarditis having signed out AMA at that time. She had a tricuspid vegetation at that time. Admitted 4/4 hypotensive and in respiratory distress requiring intubation at [facility] long. Admitted for acute respiratory failure/septic shock/ AKI due to septic emboli with MSSA bacteremia.  Significant events 4/04 Admit with hypotension / resp distress, intubated  4/05 Family discussion >concern terminal based on sepsis, AKI, septic emboli pancytopenia  4/10 UOP improving, off pressors, low grade fevers  4/11 Agitated, PRN versed. Awake / FC. Looks best in many days. Failed SBT 4/12 Failed SBT. AKI improving, tx PRBC. Less alert on precedex, methadone  4/13 More alert, FC. On precedex. Failed SBT. ID recommending PICC for antibiotcs >mother not sure about goals of care. S/p 1 unit PRBC   4/17 Trach placement 4/21 tolerating trach collar trials. 4/23 cdiff result positive overnight with continued diarrhea 4/26 seizure 4/28 status epilepticus  5/7- Tolerating trach collar  5/8- Trach replaced- 6 cuffed  5/12- 2 units PRBCs, trach changed to 4 cuff less  Subjective: Patient has been asking to go home since last night.  She says she just want to go home.  She understands about the seriousness of her condition, risk and benefit of treatment options including no treatment. She asked why she cannot go home and complete the antibiotic course at home. Unfortunately, this is not a safe option due to her history of IV drug use.  She is obviously not in agreement with CIR after her treatment course.  She has been refusing  tube feeding as well but oral intake has improved. I have called and talked to patient's mother about this.  Patient's mother was asking about sedation until she completed the antibiotic course.  Unfortunately, this cannot be the option as patient has capacity although she is making poor judgment.  Patient's mother voices understanding this.  She said she will call her and talk to her to to see if she can convince her to stay until she completes antibiotic course.  Patient's mother stated that she is familiar with trach care at home she had trach herself in the past.  Assessment & Plan: MSSA bacteremia with tricuspid valve endocarditis and septic pulmonary emboli E. coli pneumonia -Appreciate infectious disease input -Cefepime and linezolid 5/6-5/13, and ancef through 03/06/2019 per ID  Tachycardia: Heart rate in 100s to 110s. Could be due to anemia and anxiety.  Thyroid panel not that impressive. -Manage anemia as below  Respiratory failure with hypoxia status post trach -Trach management per PCCM-tolerating trach collar with Passy-Muir valve  AKI: Serum creatinine 7.12 on admission.  Unknown baseline.  Likely due to septic shock.  Improved -We will monitor PRN  Severe protein calorie malnutrition/dysphagia -Tolerating oral and tube feeding -Appreciate nutrition and SLP input -Continue multivitamin  C. difficile colitis -Completed treatment  Seizure activity/status epilepticus/PRES -Seizure-like activity on 4/26 and status epilepticus on 4/28 -No further seizures -Keppra per neurology -Dilantin discontinued 5/7 -Outpatient neurology follow-up  Anemia of chronic disease/positive Hemoccult: Hemoglobin 5.8 on 5/12 partly dilutional.  Hemoccult positive.  Received 2 units on 5/12 with appropriate response. - H&H stable. -We will continue monitoring -  GI consult if further drop.  Thrombocytopenia: Resolved  Essential hypertension: Blood pressure fairly controlled -Continue home  medications  Anxiety -Continue Klonopin and PRN  Noncompliance: Previously left AMA when she was hospitalized with bacteremia and endocarditis.  Again threatens to leave AMA.  Patient has capacity but making poor judgments.  Patient's mother to talk to patient to try to convince her.   Scheduled Meds: . acetaminophen  650 mg Oral Q6H  . Chlorhexidine Gluconate Cloth  6 each Topical Daily  . fentaNYL  1 patch Transdermal Q72H  . levETIRAcetam  1,000 mg Oral Q12H  . metoprolol tartrate  12.5 mg Oral BID  . multivitamin  15 mL Oral Daily  . pantoprazole  40 mg Oral Daily  . sodium chloride flush  10-40 mL Intracatheter Q12H  . vitamin C  250 mg Oral BID   Continuous Infusions: . sodium chloride Stopped (02/28/19 1732)  .  ceFAZolin (ANCEF) IV 2 g (03/01/19 0953)  . feeding supplement (JEVITY 1.2 CAL) 1,000 mL (02/28/19 1246)   PRN Meds:.sodium chloride, clonazepam, guaiFENesin, labetalol, LORazepam, [DISCONTINUED] ondansetron **OR** ondansetron (ZOFRAN) IV, Resource ThickenUp Clear, sodium chloride, sodium chloride flush   DVT prophylaxis: SCD Code Status: Partial Family Communication: Talk to patient's mother over the phone Disposition Plan: Remains inpatient for IV antibiotics and trach collar management.  Final disposition likely home as patient refuses CIR  Consultants:   PCCM  ID  Neurology  Palliative  Nephrology  Procedures:  ETT 4/4 >> 4/17 R Femoral line 4/4 >> 4/8 R IJ Tunneled CVC (IR) 4/21 >> Trach 4/17 >>  Microbiology: . Blood culture MSSA on 4/4 . Urine culture MSSA on 4/4 . Blood culture on 4/8-negative . Respiratory culture on 5/4 with multidrug-resistant E. coli  Antimicrobials: Anti-infectives (From admission, onward)   Start     Dose/Rate Route Frequency Ordered Stop   03/01/19 1000  ceFAZolin (ANCEF) IVPB 2g/100 mL premix     2 g 200 mL/hr over 30 Minutes Intravenous Every 8 hours 02/28/19 1453 03/06/19 2359   02/27/19 1000  linezolid  (ZYVOX) tablet 600 mg     600 mg Oral Every 12 hours 02/27/19 0940 02/28/19 2258   02/22/19 1015  linezolid (ZYVOX) tablet 600 mg  Status:  Discontinued     600 mg Per Tube Every 12 hours 02/22/19 1012 02/27/19 0940   02/22/19 1000  meropenem (MERREM) 2 g in sodium chloride 0.9 % 100 mL IVPB  Status:  Discontinued     2 g 200 mL/hr over 30 Minutes Intravenous Every 12 hours 02/22/19 0853 02/22/19 0934   02/22/19 1000  ceFEPIme (MAXIPIME) 2 g in sodium chloride 0.9 % 100 mL IVPB     2 g 200 mL/hr over 30 Minutes Intravenous Every 12 hours 02/22/19 0935 03/01/19 0959   02/22/19 1000  linezolid (ZYVOX) 100 MG/5ML suspension 600 mg  Status:  Discontinued     600 mg Per Tube Every 12 hours 02/22/19 0938 02/22/19 1008   02/20/19 2200  ceFEPIme (MAXIPIME) 2 g in sodium chloride 0.9 % 100 mL IVPB  Status:  Discontinued     2 g 200 mL/hr over 30 Minutes Intravenous Every 12 hours 02/20/19 1444 02/22/19 0848   02/09/19 1400  ceFAZolin (ANCEF) IVPB 2g/100 mL premix  Status:  Discontinued     2 g 200 mL/hr over 30 Minutes Intravenous Every 8 hours 02/09/19 0902 02/20/19 1444   02/08/19 1000  vancomycin (VANCOCIN) 50 mg/mL oral solution 125 mg  Status:  Discontinued  125 mg Per Tube 4 times daily 02/08/19 0921 02/20/19 0823   02/06/19 1200  fluconazole (DIFLUCAN) tablet 100 mg     100 mg Oral Daily 02/06/19 1146 02/08/19 0956   01/29/19 1000  ceFAZolin (ANCEF) IVPB 2g/100 mL premix  Status:  Discontinued     2 g 200 mL/hr over 30 Minutes Intravenous Every 12 hours 01/28/19 2145 02/09/19 0902   01/28/19 2200  ceFAZolin (ANCEF) IVPB 1 g/50 mL premix     1 g 100 mL/hr over 30 Minutes Intravenous  Once 01/28/19 2145 01/28/19 2248   01/28/19 2130  ceFAZolin (ANCEF) IVPB 2g/100 mL premix  Status:  Discontinued     2 g 200 mL/hr over 30 Minutes Intravenous Every 12 hours 01/28/19 2121 01/28/19 2145   01/20/19 2000  ceFAZolin (ANCEF) IVPB 1 g/50 mL premix  Status:  Discontinued     1 g 100 mL/hr over  30 Minutes Intravenous Every 24 hours 01/20/19 1831 01/28/19 2121   01/20/19 1400  piperacillin-tazobactam (ZOSYN) IVPB 2.25 g  Status:  Discontinued    Note to Pharmacy:  Pharmacy to dose   2.25 g 66.7 mL/hr over 30 Minutes Intravenous Every 8 hours 01/20/19 1234 01/20/19 1239   01/20/19 1400  piperacillin-tazobactam (ZOSYN) IVPB 2.25 g  Status:  Discontinued     2.25 g 100 mL/hr over 30 Minutes Intravenous Every 8 hours 01/20/19 1239 01/20/19 1831   01/20/19 0545  ceftaroline (TEFLARO) injection SOLR 300 mg  Status:  Discontinued     300 mg Intravenous  Once 01/20/19 0534 01/20/19 0535   01/20/19 0545  ceftaroline (TEFLARO) 300 mg in sodium chloride 0.9 % 250 mL IVPB     300 mg 250 mL/hr over 60 Minutes Intravenous  Once 01/20/19 0539 01/20/19 0727   01/20/19 0430  vancomycin (VANCOCIN) IVPB 1000 mg/200 mL premix  Status:  Discontinued     1,000 mg 200 mL/hr over 60 Minutes Intravenous  Once 01/20/19 0348 01/20/19 0519   01/20/19 0400  piperacillin-tazobactam (ZOSYN) IVPB 3.375 g     3.375 g 100 mL/hr over 30 Minutes Intravenous  Once 01/20/19 0348 01/20/19 0423      Objective: Vitals:   03/01/19 0900 03/01/19 0948 03/01/19 1000 03/01/19 1100  BP:      Pulse: (!) 107 (!) 108 (!) 107 (!) 106  Resp: (!) 31  (!) 24 (!) 29  Temp:      TempSrc:      SpO2: 99%  99% 100%  Weight:      Height:        Intake/Output Summary (Last 24 hours) at 03/01/2019 1155 Last data filed at 03/01/2019 1100 Gross per 24 hour  Intake 1220.01 ml  Output 1450 ml  Net -229.99 ml   Filed Weights   02/25/19 0343 02/26/19 0500 02/28/19 0500  Weight: 54 kg 54.3 kg 55 kg    Examination:  GENERAL: No acute distress.  Appears well.  HEENT: MMM.  Vision and hearing grossly intact.  NECK: Trach collar in place. LUNGS:  No IWOB.  Fair air movement bilaterally HEART: Heart rate ranges from 100-110s.  S1 and S2 audible.  Regular rhythm. ABD: Bowel sounds present. Soft. Non tender.  MSK/EXT:  Moves all  extremities. No apparent deformity. No edema bilaterally.  SKIN: no apparent skin lesion or wound NEURO: Awake, alert and oriented appropriately.  No gross deficit.  PSYCH: Calm. Normal affect.  Data Reviewed: I have independently reviewed following labs and imaging studies  CBC: Recent Labs  Lab 02/24/19 0511 02/25/19 0323 02/27/19 0330 02/28/19 0517 03/01/19 0419  WBC 3.7* 5.1 4.4 6.8  --   HGB 7.2* 7.1* 5.8* 8.8* 8.3*  HCT 22.6* 22.4* 18.8* 27.6* 26.1*  MCV 93.0 94.1 92.2 93.6  --   PLT 147* 166 141* 159  --    Basic Metabolic Panel: Recent Labs  Lab 02/24/19 0511 02/25/19 0323 02/27/19 0330 02/28/19 0907  NA 146* 145 143 137  K 4.0 4.2 4.4 4.7  CL 116* 119* 115* 111  CO2 15* 17* 17* 17*  GLUCOSE 114* 113* 101* 110*  BUN 71* 65* 54* 45*  CREATININE 1.28* 1.23* 1.11* 1.25*  CALCIUM 7.9* 7.9* 7.8* 7.9*  MG 1.4* 1.7 1.5* 1.7  PHOS 3.0  --   --   --    GFR: Estimated Creatinine Clearance: 49.2 mL/min (A) (by C-G formula based on SCr of 1.25 mg/dL (H)). Liver Function Tests: No results for input(s): AST, ALT, ALKPHOS, BILITOT, PROT, ALBUMIN in the last 168 hours. No results for input(s): LIPASE, AMYLASE in the last 168 hours. No results for input(s): AMMONIA in the last 168 hours. Coagulation Profile: No results for input(s): INR, PROTIME in the last 168 hours. Cardiac Enzymes: No results for input(s): CKTOTAL, CKMB, CKMBINDEX, TROPONINI in the last 168 hours. BNP (last 3 results) No results for input(s): PROBNP in the last 8760 hours. HbA1C: No results for input(s): HGBA1C in the last 72 hours. CBG: Recent Labs  Lab 02/27/19 1726 02/28/19 0033 02/28/19 0743 02/28/19 1532 03/01/19 0120  GLUCAP 92 108* 94 89 99   Lipid Profile: No results for input(s): CHOL, HDL, LDLCALC, TRIG, CHOLHDL, LDLDIRECT in the last 72 hours. Thyroid Function Tests: Recent Labs    03/01/19 0419  TSH 5.552*  FREET4 0.72*   Anemia Panel: No results for input(s): VITAMINB12,  FOLATE, FERRITIN, TIBC, IRON, RETICCTPCT in the last 72 hours. Urine analysis:    Component Value Date/Time   COLORURINE AMBER (A) 01/20/2019 0252   APPEARANCEUR CLOUDY (A) 01/20/2019 0252   LABSPEC 1.020 01/20/2019 0252   PHURINE 5.0 01/20/2019 0252   GLUCOSEU NEGATIVE 01/20/2019 0252   HGBUR LARGE (A) 01/20/2019 0252   BILIRUBINUR NEGATIVE 01/20/2019 0252   KETONESUR NEGATIVE 01/20/2019 0252   PROTEINUR 100 (A) 01/20/2019 0252   NITRITE NEGATIVE 01/20/2019 0252   LEUKOCYTESUR TRACE (A) 01/20/2019 0252   Sepsis Labs: Invalid input(s): PROCALCITONIN, LACTICIDVEN  Recent Results (from the past 240 hour(s))  MRSA PCR Screening     Status: None   Collection Time: 02/22/19 11:26 AM  Result Value Ref Range Status   MRSA by PCR NEGATIVE NEGATIVE Final    Comment:        The GeneXpert MRSA Assay (FDA approved for NASAL specimens only), is one component of a comprehensive MRSA colonization surveillance program. It is not intended to diagnose MRSA infection nor to guide or monitor treatment for MRSA infections. Performed at [facility], [place] [address] [place], [place] [address]       Radiology Studies: No results found.  Aleksa Collinsworth T. [facility] Pager [phone]  If 7PM-7AM, please contact night-coverage [email or web address] Password [facility] 03/01/2019, 11:55 AM

## 2019-03-01 NOTE — Progress Notes (Signed)
This RN paged Dr. Cyndia Skeeters to see if he would round on the patient and call her mother. The MD had just rounded on the patient. He encouraged her to stay for the remaining 5 days of her ABX treatments. Pt mother called, no answer. Pt is very addiment about leaving AMA. RN will continue to monitor patient at this time and encourage her to stay in hospital.

## 2019-03-01 NOTE — Progress Notes (Signed)
Patient wants MD to call her mom so she can go home today. She is aware that she has not been discharged and needs IV antibiotics but would like to have the doctor talk with her mom about going home today.

## 2019-03-01 NOTE — Progress Notes (Signed)
PT Cancellation Note  Patient Details Name: Molly [name] MRN: [id number] DOB: 06-Mar-1986   Cancelled Treatment:    Reason Eval/Treat Not Completed: Fatigue/lethargy limiting ability to participate(pt stated she wants to sleep right now, she declined PT. Will follow. )  Philomena Doheny PT 03/01/2019  Acute Rehabilitation Services Pager [phone] Office [phone]

## 2019-03-01 NOTE — Progress Notes (Signed)
Pt asleep at time of scheduled trach check. No obvious respiratory distress noted. Extra trach left at bedside. RT will continue to monitor.

## 2019-03-01 NOTE — Progress Notes (Signed)
Nutrition Follow-up  DOCUMENTATION CODES:   Not applicable  INTERVENTION:   -D/c liquid MVI -MVI with minerals daily -If pt agreeable, resume TF:   Jevity 1.2 @ 60 ml/hr via post-pyloric tube 30 ml Prostat daily.    Tube feeding regimen provides 1828 kcal (100% of needs), 95 grams of protein, and 162 ml of H2O.   -Continue 250 mg vitamin C BID  NUTRITION DIAGNOSIS:   Inadequate oral intake related to acute illness, dysphagia, other (see comment)(current diet order) as evidenced by other (comment)(CLD, nectar-thick).  Ongoing  GOAL:   Patient will meet greater than or equal to 90% of their needs  Progressing  MONITOR:   TF tolerance, PO intake, Supplement acceptance, Diet advancement, Labs, Weight trends, Skin  REASON FOR ASSESSMENT:   Ventilator    ASSESSMENT:    33 y/o female with h/o IVDU/polysubstance abuse and MRSA bacteremia/TV endocarditis/spinal osteomyelitis/septic pulmonary emboli (left AMA July 2019) who presented to the ED with AMS found to be septic with hypothermia, hypotension, and hypoxia.   4/04 - admit, intubated 4/17- trach placed, Cortrak placed at LOT 4/21 - Cortrak tube tip pulled back to gastric placement 4/23 - MBS with diet advancement to Regular with nectar-thick liquids; C.diff + 4/26 - seizures 4/28 - status epilepticus, back on vent 5/01 - diet advanced from NPO to FLD, nectar thick 5/03 - diet changed to CLD, nectar thick d/t lactose intolerance 5/11- cortrack tube clogged, replaced by IR 5/12- trach downsized to #4 uncuffed shiley 5/13- transferred from ICU to PCU  Reviewed I/O's: +182 ml x 24 hours and +3.4 L since admission  UOP: 1.1 L  In an effort to preserve PPE, RD did not enter room to evaluate pt. Discussed case with previous RD who was following pt while she was in the ICU; pt experiencing intermittent vomiting and Juven was d/c last week secondary to poor tolerance.   Case discussed with nurse tech, RN, and SLP. Pt  started refusing treatments once she transferred from ICU yesterday. Pt greatly desires to go home, although she need to complete course of IV antibiotics for another 5 days. SLP suspects pt is refusing care in attempt to discharge.   SLP reports that pt's intake has improved- estimated pt consumed 50-75% of breakfast tray. Pt routinely consumes 2-3 nectar thick juices in one sitting. SLP reports potential plan for pt to undergo repeat MBSS later today to determine if pt can advance to thin liquids- pt with improved vocal quality and secretions since last trach change.   Noted that Ensure was d/c due to pt's poor acceptance. Pt will require addition of nutritional supplements due to increased nutrient needs related to wound healing and debility, especially if she continues to refuse TF. Agree with plan for MBSS, as pt would have access to increased supplement options on cthin liquids. Currently, due to nectar thick liquids, pt's supplements needs are limited to Ensure due to prior poor acceptance of Juven and lactose intolerance.   Labs reviewed: CBGS: 89-99.   Diet Order:   Diet Order            Diet regular Room service appropriate? Yes with Assist; Fluid consistency: Nectar Thick  Diet effective now              EDUCATION NEEDS:   Not appropriate for education at this time  Skin:  Skin Assessment: Skin Integrity Issues: Skin Integrity Issues:: Stage II, DTI DTI: sacrum- epithelialized Stage II: back Other: MASD medial groin  Last  BM:  03/01/19  Height:   Ht Readings from Last 1 Encounters:  02/28/19 5\' 1"  (1.549 m)    Weight:   Wt Readings from Last 1 Encounters:  02/28/19 55 kg    Ideal Body Weight:  47.7 kg  BMI:  Body mass index is 22.91 kg/m.  Estimated Nutritional Needs:   Kcal:  1795-1960 kcal  Protein:  90-100 grams  Fluid:  >/= 1.7 L    Keyana Guevara A. Jimmye Norman, RD, LDN, [place] Registered Dietitian II Certified Diabetes Care and Education  Specialist Pager: [phone] After hours Pager: [phone]

## 2019-03-01 NOTE — Progress Notes (Addendum)
NAME:  Molly [name], MRN:  [id number], DOB:  12-19-1985, LOS: 8 ADMISSION DATE:  01/20/2019, CONSULTATION DATE: 01/20/2019 REFERRING MD:  Bonner Puna, CHIEF COMPLAINT:  Sepsis  Brief History   33 y/o F with a history of IV drug abuse/polysubstance abuse (presented with urine drug screen positive for opiates and amphetamines) and a previous history in June or July 2019 of inadequately treated MRSA endocarditis having signed out AMA at that time.  She had a tricuspid vegetation at that time.  Admitted 4/4 hypotensive and in respiratory distress requiring intubation at [facility] long.   Admitted for acute respiratory failure/septic shock/ AKI due to septic emboli with MSSA bacteremia.  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 Awake / FC. Looks best in many days. Failed SBT 4/12 Failed SBT.  AKI improving, tx PRBC.  Less alert on precedex, methadone  4/13 More alert, FC. On precedex. Failed SBT. ID recommending PICC for antibiotcs > mother not sure about goals of care. S/p 1 unit PRBC   4/17 Trach placement- 6 cuffed 4/21 Tolerating trach collar trials. 4/23 cdiff result positive overnight with continued diarrhea 4/26 seizure 4/28 status epilepticus 5/7- Tolerating trach collar 5/8- Trach replaced- 6 cuffed 5/12- 2 units PRBCs, trach changed to 4 cuff less  Consults:  Nephrology Neurology  Procedures:  ETT 4/4 >> 4/17 R Femoral line 4/4 >> 4/8 R IJ Tunneled CVC (IR) 4/20 >>  Trach 4/17 >>  Significant  Diagnostic Tests:  Echocardiogram 4/4 >> TV vegetation measures 1.5 mm x 0.6 mm., severe TR CT Chest 4/4 >> bilateral pulmonary nodules, peripheral and cavitated.  LE venous duplex 4/20 >> negative MRI brain 4/26 >> scattered T2/FLAIR most in parieto-occipital region b/l EEG 4/27 >> no evidence of seizures  Micro Data:  Blood cultures x2 4/4 >> MSSA  BCx2 4/8 >> negative   C diff 4/22 >> antigen positive, toxin negative, PCR positive 4/28 urine cx: neg 5/4 trach cx: ecoli, VRE  Antimicrobials:  Vancomycin Zosyn Teflaro >> stopped Ancef 4/4 > 5/5 Diflucan 4/21 >> 4/24 Vancomycin po 4/23 >> 5/5    Cefepime 5/5>> Linezolid 5/7 >>  Interim history/subjective:  Continues on tach collar with no issues Has moderate secretions Transfused 2 units PRBCs for low hemoglobin.  Objective   Blood pressure (!) 129/106, pulse (!) 106, temperature 98.4 F (36.9 C), temperature source Oral, resp. rate (!) 29, height 5\' 1"  (1.549 m), weight 55 kg, SpO2 100 %.    FiO2 (%):  [21 %-28 %] 21 %   Intake/Output Summary (Last 24 hours) at 03/01/2019 1145 Last data filed at 03/01/2019 1100 Gross per 24 hour  Intake 1220.01 ml  Output 1450 ml  Net -229.99 ml   Filed Weights   02/25/19 0343 02/26/19 0500 02/28/19 0500  Weight: 54 kg 54.3 kg 55 kg   General: Cachectic female who wants to go home HEENT: Tracheostomy in place with Passy-Muir valve with a strong voice Neuro: Grossly intact CV: s1s2 rrr, no m/r/g PULM: even/non-labored, lungs bilaterally rhonchi DG:UYQI, non-tender, bsx4 active  Extremities: warm/dry,  negative edema  Skin: no rashes or lesions    Resolved Hospital Problem list   Septic Shock, thrush  Active Problem list   New onset seizure 4/26 Status epilepticus 4/28 Continue Keppra per neurology's instructions Dilantin stopped on 02/22/2019 per neurology's input  MSSA bacteremia with tricuspid valve endocarditis E coli and VRE PNA Biotics per ID  C. difficile colitis  s/p tx  Acute resp failure with hypoxia s/p trach Septic pulmonary emboi Currently tolerating trach collar with Passy-Muir valve may be near time for decannulation Pulmonary critical care will evaluate again next week.  Severe protein calorie malnutrition Dysphagia Continue tube feedings due to severe malnutrition Continue dysphagia diet  AKI:  Per primary  Hypokalemia Per primary  Metabolic acidosis:  Per primary  Anemia of chronic illness Per primary  Thrombocytopenia: improving 101->125 resolved with platelets 156 Leukopenia 3.5->3.9 resolved with white blood cells 6.8 Per primary  Best practice:  Diet: tube feeds and PO nectar thick liquids DVT prophylaxis: SQ heparin GI prophylaxis: Protonix Mobility: oob with assistance Code Status: Limited code Disposition: ICU  Labs:   CMP Latest Ref Rng & Units 02/28/2019 02/27/2019 02/25/2019  Glucose 70 - 99 mg/dL 110(H) 101(H) 113(H)  BUN 6 - 20 mg/dL 45(H) 54(H) 65(H)  Creatinine 0.44 - 1.00 mg/dL 1.25(H) 1.11(H) 1.23(H)  Sodium 135 - 145 mmol/L 137 143 145  Potassium 3.5 - 5.1 mmol/L 4.7 4.4 4.2  Chloride 98 - 111 mmol/L 111 115(H) 119(H)  CO2 22 - 32 mmol/L 17(L) 17(L) 17(L)  Calcium 8.9 - 10.3 mg/dL 7.9(L) 7.8(L) 7.9(L)  Total Protein 6.5 - 8.1 g/dL - - -  Total Bilirubin 0.3 - 1.2 mg/dL - - -  Alkaline Phos 38 - 126 U/L - - -  AST 15 - 41 U/L - - -  ALT 0 - 44 U/L - - -   CBC Latest Ref Rng & Units 03/01/2019 02/28/2019 02/27/2019  WBC 4.0 - 10.5 K/uL - 6.8 4.4  Hemoglobin 12.0 - 15.0 g/dL 8.3(L) 8.8(L) 5.8(LL)  Hematocrit 36.0 - 46.0 % 26.1(L) 27.6(L) 18.8(L)  Platelets 150 - 400 K/uL - 159 141(L)   ABG    Component Value Date/Time   PHART 7.439 02/13/2019 0941   PCO2ART 30.5 (L) 02/13/2019 0941   PO2ART 115.0 (H) 02/13/2019 0941   HCO3 20.7 02/13/2019 0941   TCO2 22 02/13/2019 0941   ACIDBASEDEF 3.0 (H) 02/13/2019 0941   O2SAT 99.0 02/13/2019 0941   Richardson Landry  ACNP Maryanna Shape PCCM Pager [phone]  till 1 pm If no answer page [phone] 03/01/2019, 11:45 AM

## 2019-03-02 DIAGNOSIS — L89112 Pressure ulcer of right upper back, stage 2: Secondary | ICD-10-CM

## 2019-03-02 LAB — BASIC METABOLIC PANEL
Anion gap: 8 (ref 5–15)
BUN: 30 mg/dL — ABNORMAL HIGH (ref 6–20)
CO2: 21 mmol/L — ABNORMAL LOW (ref 22–32)
Calcium: 7.8 mg/dL — ABNORMAL LOW (ref 8.9–10.3)
Chloride: 108 mmol/L (ref 98–111)
Creatinine, Ser: 1.21 mg/dL — ABNORMAL HIGH (ref 0.44–1.00)
GFR calc Af Amer: >60 mL/min (ref >60)
GFR calc non Af Amer: 60 mL/min — ABNORMAL LOW (ref >60)
Glucose, Bld: 98 mg/dL (ref 70–99)
Potassium: 3.8 mmol/L (ref 3.5–5.1)
Sodium: 137 mmol/L (ref 135–145)

## 2019-03-02 LAB — HEMOGLOBIN AND HEMATOCRIT, BLOOD
HCT: 29 % — ABNORMAL LOW (ref 36.0–46.0)
Hemoglobin: 9.4 g/dL — ABNORMAL LOW (ref 12.0–15.0)

## 2019-03-02 LAB — MAGNESIUM: Magnesium: 1.4 mg/dL — ABNORMAL LOW (ref 1.7–2.4)

## 2019-03-02 MED ORDER — HYDROCODONE-ACETAMINOPHEN 5-325 MG PO TABS: 2.0000 | ORAL_TABLET | Freq: Once | ORAL | Status: DC

## 2019-03-02 MED ORDER — METOPROLOL TARTRATE 25 MG PO TABS
25.0000 mg | ORAL_TABLET | Freq: Two times a day (BID) | ORAL | Status: DC
  Administered 2019-03-02 – 2019-03-04 (×5): 25 mg via ORAL
  Filled 2019-03-02 (×6): qty 1

## 2019-03-02 MED ORDER — KETOROLAC TROMETHAMINE 30 MG/ML IJ SOLN
30.0000 mg | Freq: Once | INTRAMUSCULAR | Status: AC
  Administered 2019-03-02: 30 mg via INTRAVENOUS
  Filled 2019-03-02: qty 1

## 2019-03-02 MED ORDER — MAGNESIUM SULFATE 2 GM/50ML IV SOLN
2.0000 g | Freq: Once | INTRAVENOUS | Status: AC
  Administered 2019-03-02: 16:00:00 2 g via INTRAVENOUS
  Filled 2019-03-02: qty 50

## 2019-03-02 MED ORDER — CYCLOBENZAPRINE HCL 10 MG PO TABS
10.0000 mg | ORAL_TABLET | Freq: Once | ORAL | Status: AC
  Administered 2019-03-02: 20:00:00 10 mg via ORAL
  Filled 2019-03-02: qty 1

## 2019-03-02 NOTE — Progress Notes (Signed)
PMV misplaced earlier, pt able to turn and PMV found underneath her. PMV placed back on trach per pt's request, and 21% ATC removed at this time. Pt tolerating PMV well. RT will continue to monitor.

## 2019-03-02 NOTE — Progress Notes (Signed)
Physical Therapy Treatment Patient Details Name: Molly [name] MRN: [id number] DOB: 1985-12-09 Today's Date: 03/02/2019    History of Present Illness Pt adm for acute respiratory failure due to Septic pulmonary embolus due to MRSA tricuspid vegetation. Pt intubated 01/20/19. Pt with multiple seizures 4/28-4/29. 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 Pt stood with RW for 60 seconds x 2 trials, standing tolerance limited by BLE fatigue. Performed BLE strengthening exercises in sitting and standing. HR 123 with activity, SaO2 100% on room air. Pt became teary and stated she, "really misses her mom" and that she wants to go outside.   Follow Up Recommendations  CIR     Equipment Recommendations  Other (comment)(TBD at next venue)    Recommendations for Other Services       Precautions / Restrictions Precautions Precautions: Fall Precaution Comments: trach, cortrak Restrictions Weight Bearing Restrictions: No    Mobility  Bed Mobility               General bed mobility comments: up in recliner  Transfers Overall transfer level: Needs assistance Equipment used: Rolling walker (2 wheeled) Transfers: Sit to/from Stand Sit to Stand: Min assist         General transfer comment: sit to stand x 2 trials with RW, VCs hand placement, pt stood for 60 seconds x 2 with RW, standing tolerance limited by BLE fatigue, HR 123 with standing, 113 at rest  Ambulation/Gait                 Stairs             Wheelchair Mobility    Modified Rankin (Stroke Patients Only)       Balance   Sitting-balance support: Feet supported;Single extremity supported Sitting balance-Leahy Scale: Fair     Standing balance support: Bilateral upper extremity supported Standing balance-Leahy Scale: Poor                               Cognition Arousal/Alertness: Awake/alert Behavior During Therapy: Flat affect Overall Cognitive Status: No family/caregiver present to determine baseline cognitive functioning                         Following Commands: Follows one step commands consistently Safety/Judgement: Decreased awareness of safety Awareness: Intellectual Problem Solving: Requires verbal cues General Comments: decreased recall of safety precautions, pt verbalized frustration at not being able to see her mother and became teary about this, she also requested to go outside      Exercises General Exercises - Lower Extremity Ankle Circles/Pumps: AROM;Both;10 reps;Seated Long Arc Quad: AROM;Both;15 reps;Seated Hip Flexion/Marching: AROM;20 reps;Standing Heel Raises: AROM;10 reps;Standing;Both    General Comments        Pertinent Vitals/Pain Faces Pain Scale: Hurts little more Pain Location: low back (pt sitting with significantly slouched position in recliner, repositioned) Pain Descriptors / Indicators: Discomfort Pain Intervention(s): Limited activity within patient's tolerance;Monitored during session;Repositioned    Home Living                      Prior Function            PT Goals (current goals can now be found in the care plan section) Acute Rehab PT Goals Patient Stated Goal: wants  to see her mom and to go outside PT Goal Formulation: With patient Time For Goal Achievement: 03/01/19 Potential to Achieve Goals: Fair Progress towards PT goals: Progressing toward goals    Frequency    Min 3X/week      PT Plan Current plan remains appropriate    Co-evaluation              AM-PAC PT "6 Clicks" Mobility   Outcome Measure  Help needed turning from your back to your side while in a flat bed without using bedrails?: A Little Help needed moving from lying on your back to sitting on the side of a flat bed without using bedrails?: A Little Help needed moving  to and from a bed to a chair (including a wheelchair)?: A Little Help needed standing up from a chair using your arms (e.g., wheelchair or bedside chair)?: A Little Help needed to walk in hospital room?: A Lot Help needed climbing 3-5 steps with a railing? : Total 6 Click Score: 15    End of Session   Activity Tolerance: Patient tolerated treatment well Patient left: in chair;with call bell/phone within reach;with chair alarm set Nurse Communication: Mobility status PT Visit Diagnosis: Other abnormalities of gait and mobility (R26.89);Muscle weakness (generalized) (M62.81);Unsteadiness on feet (R26.81);Difficulty in walking, not elsewhere classified (R26.2)     Time: 6644-0347 PT Time Calculation (min) (ACUTE ONLY): 20 min  Charges:  $Therapeutic Activity: 8-22 mins                     Blondell Reveal Kistler PT 03/02/2019  Acute Rehabilitation Services Pager [phone] Office [phone]

## 2019-03-02 NOTE — Progress Notes (Signed)
PROGRESS NOTE  Chakara Bognar JQB:[id number] DOB: 10/31/1985 DOA: 01/20/2019 PCP: Patient, No Pcp Per   LOS: 41 days   Patient is from: Home  Brief Narrative / Interim history: 33 y/o F with a history of IV drug abuse/polysubstance abuse (presented with urine drug screen positive for opiates and amphetamines) and a previous history in June or July 2019 of inadequately treated MRSA endocarditis having signed out AMA at that time. She had a tricuspid vegetation at that time. Admitted 4/4 hypotensive and in respiratory distress requiring intubation at [facility] long. Admitted for acute respiratory failure/septic shock/ AKI due to septic emboli with MSSA bacteremia.  Significant events 4/04 Admit with hypotension / resp distress, intubated  4/05 Family discussion >concern terminal based on sepsis, AKI, septic emboli pancytopenia  4/10 UOP improving, off pressors, low grade fevers  4/11 Agitated, PRN versed. Awake / FC. Looks best in many days. Failed SBT 4/12 Failed SBT. AKI improving, tx PRBC. Less alert on precedex, methadone  4/13 More alert, FC. On precedex. Failed SBT. ID recommending PICC for antibiotcs >mother not sure about goals of care. S/p 1 unit PRBC   4/17 Trach placement 4/21 tolerating trach collar trials. 4/23 cdiff result positive overnight with continued diarrhea 4/26 seizure 4/28 status epilepticus  5/7- Tolerating trach collar  5/8- Trach replaced- 6 cuffed  5/12- 2 units PRBCs, trach changed to 4 cuff less  Subjective: No major events overnight of this morning.  She says she had few episodes of emesis.  Also complains about back pain  Assessment & Plan: MSSA bacteremia with tricuspid valve endocarditis and septic pulmonary emboli E. coli pneumonia -Appreciate infectious disease input -Cefepime and linezolid 5/6-5/13, and ancef through 03/06/2019 per ID  Tachycardia: Heart rate in 100s to 110s. Could be due to anemia and anxiety.  Thyroid panel not that  impressive. -Manage anemia as below -Start low-dose metoprolol  Respiratory failure with hypoxia status post trach -Trach management per PCCM-tolerating trach collar with Passy-Muir valve  AKI: Serum creatinine 7.12 on admission.  Unknown baseline.  Likely due to septic shock.  Improved -We will monitor PRN  Severe protein calorie malnutrition/dysphagia -Tolerating some oral intake but has been refusing tube feed. -Appreciate nutrition and SLP input -Continue multivitamin  C. difficile colitis -Completed treatment  Seizure activity/status epilepticus/PRES -Seizure-like activity on 4/26 and status epilepticus on 4/28 -No further seizures -Keppra per neurology -Dilantin discontinued 5/7 -Outpatient neurology follow-up  Anemia of chronic disease/positive Hemoccult: Hemoglobin 5.8 on 5/12 partly dilutional.  Hemoccult positive.  Received 2 units on 5/12 with appropriate response. - H&H stable. -We will continue monitoring -GI consult if further drop.  Thrombocytopenia: Resolved  Essential hypertension: Blood pressure fairly controlled -Continue home medications  Anxiety -Continue Klonopin and PRN  Stage II pressure skin injury: over his mid back. Not able to determine if present on admission -No fluctuance or signs of infection on exam. -We will continue dressing to offload pressure  Nausea and emesis -PRN Zofran -We will consider Reglan if no improvement with Zofran.  Scheduled Meds: . acetaminophen  650 mg Oral Q6H  . Chlorhexidine Gluconate Cloth  6 each Topical Daily  . fentaNYL  1 patch Transdermal Q72H  . levETIRAcetam  1,000 mg Oral Q12H  . metoprolol tartrate  12.5 mg Oral BID  . multivitamin with minerals  1 tablet Oral Daily  . pantoprazole  40 mg Oral Daily  . sodium chloride flush  10-40 mL Intracatheter Q12H  . vitamin C  250 mg Oral BID  Continuous Infusions: . sodium chloride Stopped (02/28/19 1732)  .  ceFAZolin (ANCEF) IV 2 g (03/02/19 0514)   . feeding supplement (JEVITY 1.2 CAL) 1,000 mL (02/28/19 1246)   PRN Meds:.sodium chloride, clonazepam, guaiFENesin, labetalol, LORazepam, [DISCONTINUED] ondansetron **OR** ondansetron (ZOFRAN) IV, Resource ThickenUp Clear, sodium chloride, sodium chloride flush   DVT prophylaxis: SCD Code Status: Partial Family Communication: Talk to patient's mother over the phone on 5/14 Disposition Plan: Remains inpatient for IV antibiotics and trach collar management.  Final disposition likely home as patient refuses CIR  Consultants:   PCCM  ID  Neurology  Palliative  Nephrology  Procedures:  ETT 4/4 >> 4/17 R Femoral line 4/4 >> 4/8 R IJ Tunneled CVC (IR) 4/21 >> Trach 4/17 >>  Microbiology: . Blood culture MSSA on 4/4 . Urine culture MSSA on 4/4 . Blood culture on 4/8-negative . Respiratory culture on 5/4 with multidrug-resistant E. coli  Antimicrobials: Anti-infectives (From admission, onward)   Start     Dose/Rate Route Frequency Ordered Stop   03/01/19 1000  ceFAZolin (ANCEF) IVPB 2g/100 mL premix     2 g 200 mL/hr over 30 Minutes Intravenous Every 8 hours 02/28/19 1453 03/06/19 2359   02/27/19 1000  linezolid (ZYVOX) tablet 600 mg     600 mg Oral Every 12 hours 02/27/19 0940 02/28/19 2258   02/22/19 1015  linezolid (ZYVOX) tablet 600 mg  Status:  Discontinued     600 mg Per Tube Every 12 hours 02/22/19 1012 02/27/19 0940   02/22/19 1000  meropenem (MERREM) 2 g in sodium chloride 0.9 % 100 mL IVPB  Status:  Discontinued     2 g 200 mL/hr over 30 Minutes Intravenous Every 12 hours 02/22/19 0853 02/22/19 0934   02/22/19 1000  ceFEPIme (MAXIPIME) 2 g in sodium chloride 0.9 % 100 mL IVPB     2 g 200 mL/hr over 30 Minutes Intravenous Every 12 hours 02/22/19 0935 03/01/19 0959   02/22/19 1000  linezolid (ZYVOX) 100 MG/5ML suspension 600 mg  Status:  Discontinued     600 mg Per Tube Every 12 hours 02/22/19 0938 02/22/19 1008   02/20/19 2200  ceFEPIme (MAXIPIME) 2 g in sodium  chloride 0.9 % 100 mL IVPB  Status:  Discontinued     2 g 200 mL/hr over 30 Minutes Intravenous Every 12 hours 02/20/19 1444 02/22/19 0848   02/09/19 1400  ceFAZolin (ANCEF) IVPB 2g/100 mL premix  Status:  Discontinued     2 g 200 mL/hr over 30 Minutes Intravenous Every 8 hours 02/09/19 0902 02/20/19 1444   02/08/19 1000  vancomycin (VANCOCIN) 50 mg/mL oral solution 125 mg  Status:  Discontinued     125 mg Per Tube 4 times daily 02/08/19 0921 02/20/19 0823   02/06/19 1200  fluconazole (DIFLUCAN) tablet 100 mg     100 mg Oral Daily 02/06/19 1146 02/08/19 0956   01/29/19 1000  ceFAZolin (ANCEF) IVPB 2g/100 mL premix  Status:  Discontinued     2 g 200 mL/hr over 30 Minutes Intravenous Every 12 hours 01/28/19 2145 02/09/19 0902   01/28/19 2200  ceFAZolin (ANCEF) IVPB 1 g/50 mL premix     1 g 100 mL/hr over 30 Minutes Intravenous  Once 01/28/19 2145 01/28/19 2248   01/28/19 2130  ceFAZolin (ANCEF) IVPB 2g/100 mL premix  Status:  Discontinued     2 g 200 mL/hr over 30 Minutes Intravenous Every 12 hours 01/28/19 2121 01/28/19 2145   01/20/19 2000  ceFAZolin (ANCEF) IVPB 1  g/50 mL premix  Status:  Discontinued     1 g 100 mL/hr over 30 Minutes Intravenous Every 24 hours 01/20/19 1831 01/28/19 2121   01/20/19 1400  piperacillin-tazobactam (ZOSYN) IVPB 2.25 g  Status:  Discontinued    Note to Pharmacy:  Pharmacy to dose   2.25 g 66.7 mL/hr over 30 Minutes Intravenous Every 8 hours 01/20/19 1234 01/20/19 1239   01/20/19 1400  piperacillin-tazobactam (ZOSYN) IVPB 2.25 g  Status:  Discontinued     2.25 g 100 mL/hr over 30 Minutes Intravenous Every 8 hours 01/20/19 1239 01/20/19 1831   01/20/19 0545  ceftaroline (TEFLARO) injection SOLR 300 mg  Status:  Discontinued     300 mg Intravenous  Once 01/20/19 0534 01/20/19 0535   01/20/19 0545  ceftaroline (TEFLARO) 300 mg in sodium chloride 0.9 % 250 mL IVPB     300 mg 250 mL/hr over 60 Minutes Intravenous  Once 01/20/19 0539 01/20/19 0727   01/20/19  0430  vancomycin (VANCOCIN) IVPB 1000 mg/200 mL premix  Status:  Discontinued     1,000 mg 200 mL/hr over 60 Minutes Intravenous  Once 01/20/19 0348 01/20/19 0519   01/20/19 0400  piperacillin-tazobactam (ZOSYN) IVPB 3.375 g     3.375 g 100 mL/hr over 30 Minutes Intravenous  Once 01/20/19 0348 01/20/19 0423      Objective: Vitals:   03/02/19 0200 03/02/19 0346 03/02/19 0437 03/02/19 0509  BP:    (!) 119/98  Pulse:  (!) 108  (!) 110  Resp:  (!) 28  (!) 27  Temp: 98.4 F (36.9 C)  98.5 F (36.9 C) 98.2 F (36.8 C)  TempSrc: Oral  Oral Oral  SpO2:  100%  100%  Weight:      Height:        Intake/Output Summary (Last 24 hours) at 03/02/2019 0723 Last data filed at 03/02/2019 0514 Gross per 24 hour  Intake 1222.01 ml  Output 1900 ml  Net -677.99 ml   Filed Weights   02/25/19 0343 02/26/19 0500 02/28/19 0500  Weight: 54 kg 54.3 kg 55 kg    Examination:  GENERAL: No acute distress.  Appears well.  HEENT: MMM.  Vision and hearing grossly intact.  NECK: Trach collar in place. LUNGS:  No IWOB. Good air movement bilaterally. HEART: Heart rate 100-110.  S1 and S2 audible.  Regular rhythm. ABD: Bowel sounds present. Soft. Non tender.  MSK/EXT:  Moves all extremities. No apparent deformity. No edema bilaterally.  SKIN: 2 pressure ulcer over mid back.  No fluctuance or drainage.  No surrounding erythema.  See picture below for more. NEURO: Awake, alert and oriented appropriately.  No gross deficit.  PSYCH: Calm. Normal affect.     Data Reviewed: I have independently reviewed following labs and imaging studies  CBC: Recent Labs  Lab 02/24/19 0511 02/25/19 0323 02/27/19 0330 02/28/19 0517 03/01/19 0419  WBC 3.7* 5.1 4.4 6.8  --   HGB 7.2* 7.1* 5.8* 8.8* 8.3*  HCT 22.6* 22.4* 18.8* 27.6* 26.1*  MCV 93.0 94.1 92.2 93.6  --   PLT 147* 166 141* 159  --    Basic Metabolic Panel: Recent Labs  Lab 02/24/19 0511 02/25/19 0323 02/27/19 0330 02/28/19 0907  NA 146* 145  143 137  K 4.0 4.2 4.4 4.7  CL 116* 119* 115* 111  CO2 15* 17* 17* 17*  GLUCOSE 114* 113* 101* 110*  BUN 71* 65* 54* 45*  CREATININE 1.28* 1.23* 1.11* 1.25*  CALCIUM 7.9* 7.9* 7.8* 7.9*  MG 1.4* 1.7 1.5* 1.7  PHOS 3.0  --   --   --    GFR: Estimated Creatinine Clearance: 49.2 mL/min (A) (by C-G formula based on SCr of 1.25 mg/dL (H)). Liver Function Tests: No results for input(s): AST, ALT, ALKPHOS, BILITOT, PROT, ALBUMIN in the last 168 hours. No results for input(s): LIPASE, AMYLASE in the last 168 hours. No results for input(s): AMMONIA in the last 168 hours. Coagulation Profile: No results for input(s): INR, PROTIME in the last 168 hours. Cardiac Enzymes: No results for input(s): CKTOTAL, CKMB, CKMBINDEX, TROPONINI in the last 168 hours. BNP (last 3 results) No results for input(s): PROBNP in the last 8760 hours. HbA1C: No results for input(s): HGBA1C in the last 72 hours. CBG: Recent Labs  Lab 02/27/19 1726 02/28/19 0033 02/28/19 0743 02/28/19 1532 03/01/19 0120  GLUCAP 92 108* 94 89 99   Lipid Profile: No results for input(s): CHOL, HDL, LDLCALC, TRIG, CHOLHDL, LDLDIRECT in the last 72 hours. Thyroid Function Tests: Recent Labs    03/01/19 0419  TSH 5.552*  FREET4 0.72*   Anemia Panel: No results for input(s): VITAMINB12, FOLATE, FERRITIN, TIBC, IRON, RETICCTPCT in the last 72 hours. Urine analysis:    Component Value Date/Time   COLORURINE AMBER (A) 01/20/2019 0252   APPEARANCEUR CLOUDY (A) 01/20/2019 0252   LABSPEC 1.020 01/20/2019 0252   PHURINE 5.0 01/20/2019 0252   GLUCOSEU NEGATIVE 01/20/2019 0252   HGBUR LARGE (A) 01/20/2019 0252   BILIRUBINUR NEGATIVE 01/20/2019 0252   KETONESUR NEGATIVE 01/20/2019 0252   PROTEINUR 100 (A) 01/20/2019 0252   NITRITE NEGATIVE 01/20/2019 0252   LEUKOCYTESUR TRACE (A) 01/20/2019 0252   Sepsis Labs: Invalid input(s): PROCALCITONIN, LACTICIDVEN  Recent Results (from the past 240 hour(s))  MRSA PCR Screening      Status: None   Collection Time: 02/22/19 11:26 AM  Result Value Ref Range Status   MRSA by PCR NEGATIVE NEGATIVE Final    Comment:        The GeneXpert MRSA Assay (FDA approved for NASAL specimens only), is one component of a comprehensive MRSA colonization surveillance program. It is not intended to diagnose MRSA infection nor to guide or monitor treatment for MRSA infections. Performed at [facility], [place] [address], [place], [place] [address]       Radiology Studies: No results found.  Freddi Schrager T. [facility] Pager [phone]  If 7PM-7AM, please contact night-coverage [email or web address] Password [facility] 03/02/2019, 7:23 AM

## 2019-03-02 NOTE — Progress Notes (Signed)
  Speech Language Pathology Treatment: Dysphagia;Passy Muir Speaking valve  Patient Details Name: Molly [name] MRN: [id number] DOB: 03/27/1986 Today's Date: 03/02/2019 Time: 8182-9937 SLP Time Calculation (min) (ACUTE ONLY): 12 min  Assessment / Plan / Recommendation Clinical Impression  Pt had PMV in place upon SLP arrival, producing intelligible speech with mild-moderate hoarseness noted. She subjectively reports that her voice does not sound as good to her today as it did on previous date. Pt consumed small amounts of thin liquids (water) that were already located on her tray. Attempted to provide education about current diet recommendation, rationale for trying thin liquids with SLP. Pt however was fluctuating between demanding food and refusing additional POs because she would "throw up." She would not consume more than approximately 2 ounces of water. Suspect that her airway protection would be improved with PMV now in place, but would recommend additional diagnostic trials with SLP before making diet advancements.    HPI HPI: Molly [name] 33 year old female who presented to the emergency department with altered mental status.  Patient was admitted to the hospital with working diagnosis of sepsis with multiorgan failure and possible DIC.  Patient condition deteriorated and required invasive mechanical ventilation 4/4.  CT of the chest was done which was positive for septic emboli and right base pneumonia.  Blood cultures grew MSSA.  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  Patient has history of polysubstance/IV drug abuse.  Patient has been on antibiotic therapy with cefazolin.  Underwent tracheostomy on 1/69, which was complicated by bleeding. Patient;s trach was downsized to a #4 cuffless on 5/13.       SLP Plan  Continue with current plan of care       Recommendations  Diet recommendations: Regular;Nectar-thick liquid Liquids provided via:  Cup;Straw Medication Administration: Whole meds with liquid Supervision: Patient able to self feed;Intermittent supervision to cue for compensatory strategies Compensations: Minimize environmental distractions Postural Changes and/or Swallow Maneuvers: Seated upright 90 degrees;Upright 30-60 min after meal      Patient may use Passy-Muir Speech Valve: During all waking hours (remove during sleep) PMSV Supervision: Intermittent         Oral Care Recommendations: Oral care BID Follow up Recommendations: Inpatient Rehab SLP Visit Diagnosis: Aphonia (R49.1);Dysphagia, unspecified (R13.10) Plan: Continue with current plan of care       Molly [name] 03/02/2019, 1:41 PM  [name], M.A. [place] Acute Environmental education officer [phone] Office [phone]

## 2019-03-02 NOTE — Progress Notes (Signed)
Occupational Therapy Treatment Patient Details Name: Molly [name] MRN: [id number] DOB: June 05, 1986 Today's Date: 03/02/2019    History of present illness Pt adm for acute respiratory failure due to Septic pulmonary embolus due to MRSA tricuspid vegetation. Pt intubated 01/20/19. Pt with multiple seizures 4/28-4/29. 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 MaxA for bed mobility and modA for stand pivot transfer taking a few steps from bed to recliner. Pt performing ADL functional mobility with hand held assist. Pt sat in recliner supported sitting for grooming with set-upA. Pt requires OTR to open containers. Pt tolerating session well and necessities in reach. O2 sats >95% on RA. PMV in for speaking! Pt greatly benefits from continued OT skilled services for ADL, mobility and CIR. OT to follow acutely.     Follow Up Recommendations  CIR;Supervision/Assistance - 24 hour    Equipment Recommendations  Other (comment)    Recommendations for Other Services      Precautions / Restrictions Precautions Precautions: Fall Precaution Comments: trach, cortrak Restrictions Weight Bearing Restrictions: No       Mobility Bed Mobility Overal bed mobility: Needs Assistance Bed Mobility: Supine to Sit;Rolling Rolling: Min assist   Supine to sit: Max assist     General bed mobility comments: up in recliner  Transfers Overall transfer level: Needs assistance Equipment used: Rolling walker (2 wheeled) Transfers: Sit to/from Stand Sit to Stand: Mod assist Stand pivot transfers: Mod assist       General transfer comment: requires cues to move feet    Balance Overall balance assessment: Needs assistance Sitting-balance support: Feet supported;Single extremity supported Sitting balance-Leahy Scale: Fair     Standing balance support: Bilateral upper extremity  supported Standing balance-Leahy Scale: Poor                             ADL either performed or assessed with clinical judgement   ADL Overall ADL's : Needs assistance/impaired     Grooming: Set up                               Functional mobility during ADLs: Maximal assistance General ADL Comments: Pt sitting EOB and transferred to recliner to grooming w/ set-upA     Vision   Vision Assessment?: Vision impaired- to be further tested in functional context   Perception     Praxis      Cognition Arousal/Alertness: Awake/alert Behavior During Therapy: Flat affect Overall Cognitive Status: No family/caregiver present to determine baseline cognitive functioning Area of Impairment: Safety/judgement;Awareness                       Following Commands: Follows one step commands consistently Safety/Judgement: Decreased awareness of safety Awareness: Intellectual Problem Solving: Requires verbal cues General Comments: decreased recall of safety precautions, pt verbalized frustration at not being able to see her mother and became teary about this, she also requested to go outside        Exercises General Exercises - Lower Extremity Ankle Circles/Pumps: AROM;Both;10 reps;Seated Long Arc Quad: AROM;Both;15 reps;Seated Hip Flexion/Marching: AROM;20 reps;Standing Heel Raises: AROM;10 reps;Standing;Both   Shoulder Instructions       General Comments VSS. O2 at 100% on RA with exertion     Pertinent Vitals/ Pain  Pain Assessment: Faces Faces Pain Scale: Hurts little more Pain Location: low back in sitting EOB Pain Descriptors / Indicators: Discomfort Pain Intervention(s): Limited activity within patient's tolerance;Monitored during session  Home Living                                          Prior Functioning/Environment              Frequency  Min 2X/week        Progress Toward Goals  OT  Goals(current goals can now be found in the care plan section)  Progress towards OT goals: Progressing toward goals  Acute Rehab OT Goals Patient Stated Goal: wants to see her mom and to go outside OT Goal Formulation: With patient Time For Goal Achievement: 03/14/19 Potential to Achieve Goals: Fair ADL Goals Pt Will Perform Grooming: with set-up;with supervision;sitting Pt Will Perform Upper Body Dressing: with set-up;with supervision;sitting Pt Will Perform Lower Body Dressing: with set-up;with supervision;sit to/from stand Pt Will Transfer to Toilet: with set-up;with supervision;ambulating Additional ADL Goal #1: Pt will peform bed mobility with supervision in preparation for ADLs  Plan Discharge plan remains appropriate    Co-evaluation                 AM-PAC OT "6 Clicks" Daily Activity     Outcome Measure   Help from another person eating meals?: A Little Help from another person taking care of personal grooming?: A Little Help from another person toileting, which includes using toliet, bedpan, or urinal?: Total Help from another person bathing (including washing, rinsing, drying)?: Total Help from another person to put on and taking off regular upper body clothing?: A Lot Help from another person to put on and taking off regular lower body clothing?: Total 6 Click Score: 11    End of Session Equipment Utilized During Treatment: Oxygen;Gait belt  OT Visit Diagnosis: Other abnormalities of gait and mobility (R26.89);Unsteadiness on feet (R26.81);Muscle weakness (generalized) (M62.81);Other symptoms and signs involving cognitive function   Activity Tolerance Patient limited by fatigue   Patient Left in chair;with call bell/phone within reach;with chair alarm set   Nurse Communication Mobility status        Time: 0160-1093 OT Time Calculation (min): 41 min  Charges: OT General Charges $OT Visit: 1 Visit OT Treatments $Self Care/Home Management : 38-52  mins  Darryl Nestle) Marsa Aris OTR/L Acute Rehabilitation Services Pager: [phone] Office: [place] 03/02/2019, 4:12 PM

## 2019-03-02 NOTE — Progress Notes (Signed)
Cortrak Tube Team Note:  Consult received to place a Cortrak feeding tube.  55 in Cortrak tube placed in IR on 5/12. Spoke with RN. Tube remains in place but is not being utilized at pt request. Pt is now on Regular diet.   If the tube becomes dislodged please keep the tube and contact the Cortrak team at [email or web address] (password [email or web address]) for replacement.  If after hours and replacement cannot be delayed, place a NG tube and confirm placement with an abdominal x-ray.    [place], [place], [place] Pager [phone] After Hours Pager

## 2019-03-03 LAB — HEMOGLOBIN AND HEMATOCRIT, BLOOD
HCT: 26.8 % — ABNORMAL LOW (ref 36.0–46.0)
Hemoglobin: 8.7 g/dL — ABNORMAL LOW (ref 12.0–15.0)

## 2019-03-03 LAB — MAGNESIUM: Magnesium: 2 mg/dL (ref 1.7–2.4)

## 2019-03-03 NOTE — Progress Notes (Signed)
Pt taken off 21% ATC.  PMV is already on and pt denies SOB and states breathing is fine. VS stable. RT will continue  To monitor.

## 2019-03-03 NOTE — Plan of Care (Signed)
  Problem: Health Behavior/Discharge Planning: Goal: Ability to manage health-related needs will improve Outcome: Progressing   Problem: Clinical Measurements: Goal: Ability to maintain clinical measurements within normal limits will improve Outcome: Progressing Goal: Will remain free from infection Outcome: Progressing Goal: Diagnostic test results will improve Outcome: Progressing Goal: Respiratory complications will improve Outcome: Progressing Goal: Cardiovascular complication will be avoided Outcome: Progressing   Problem: Activity: Goal: Risk for activity intolerance will decrease Outcome: Progressing   Problem: Nutrition: Goal: Adequate nutrition will be maintained Outcome: Progressing   Problem: Coping: Goal: Level of anxiety will decrease Outcome: Progressing   Problem: Elimination: Goal: Will not experience complications related to bowel motility Outcome: Progressing Goal: Will not experience complications related to urinary retention Outcome: Progressing   Problem: Pain Managment: Goal: General experience of comfort will improve Outcome: Progressing   Problem: Skin Integrity: Goal: Risk for impaired skin integrity will decrease Outcome: Progressing   Problem: Education: Goal: Knowledge about tracheostomy care/management will improve Outcome: Progressing   Problem: Activity: Goal: Ability to tolerate increased activity will improve Outcome: Progressing   Problem: Health Behavior/Discharge Planning: Goal: Ability to manage tracheostomy will improve Outcome: Progressing   Problem: Respiratory: Goal: Patent airway maintenance will improve Outcome: Progressing   Problem: Role Relationship: Goal: Ability to communicate will improve Outcome: Progressing

## 2019-03-03 NOTE — Progress Notes (Signed)
PROGRESS NOTE  Molly [name] KXF:[id number] DOB: 1986-05-05 DOA: 01/20/2019 PCP: Patient, No Pcp Per   LOS: 42 days   Patient is from: Home  Brief Narrative / Interim history: 33 y/o F with a history of IV drug abuse/polysubstance abuse (presented with urine drug screen positive for opiates and amphetamines) and a previous history in June or July 2019 of inadequately treated MRSA endocarditis having signed out AMA at that time. She had a tricuspid vegetation at that time. Admitted 4/4 hypotensive and in respiratory distress requiring intubation at [facility]. Admitted for acute respiratory failure/septic shock/ AKI due to septic emboli with MSSA bacteremia.  Significant events 4/04 Admit with hypotension / resp distress, intubated  4/05 Family discussion >concern terminal based on sepsis, AKI, septic emboli pancytopenia  4/10 UOP improving, off pressors, low grade fevers  4/11 Agitated, PRN versed. Awake / FC. Looks best in many days. Failed SBT 4/12 Failed SBT. AKI improving, tx PRBC. Less alert on precedex, methadone  4/13 More alert, FC. On precedex. Failed SBT. ID recommending PICC for antibiotcs >mother not sure about goals of care. S/p 1 unit PRBC   4/17 Trach placement 4/21 tolerating trach collar trials. 4/23 cdiff result positive overnight with continued diarrhea 4/26 seizure 4/28 status epilepticus  5/7- Tolerating trach collar  5/8- Trach replaced- 6 cuffed  5/12- 2 units PRBCs, trach changed to 4 cuff less  Subjective: No major events overnight of this morning.  She is just counting on her days to go home.  She is not considering CIR at all.  No complaint this morning.  Assessment & Plan: MSSA bacteremia with tricuspid valve endocarditis and septic pulmonary emboli E. coli pneumonia -Appreciate infectious disease input -Cefepime and linezolid 5/6-5/13, and ancef through 03/06/2019 per ID  Tachycardia: Heart rate 90s to 100.  Thyroid panel not that impressive.   Anemia could contribute. -Increased metoprolol to 25 mg twice daily  Respiratory failure with hypoxia status post trach -Trach management per PCCM-tolerating trach collar with Passy-Muir valve  AKI: Serum creatinine 7.12 on admission.  Unknown baseline.  Likely due to septic shock.  Improved -We will monitor PRN  Severe protein calorie malnutrition/dysphagia -Tolerating some oral intake but has been refusing tube feed. -Appreciate nutrition and SLP input -Continue multivitamin  C. difficile colitis -Completed treatment  Seizure activity/status epilepticus/PRES -Seizure-like activity on 4/26 and status epilepticus on 4/28 -No further seizures -Keppra per neurology -Dilantin discontinued 5/7 -Outpatient neurology follow-up  Anemia of chronic disease/positive Hemoccult: Hemoglobin 5.8 on 5/12 partly dilutional.  Hemoccult positive.  Received 2 units on 5/12 with appropriate response. - H&H stable. -We will continue monitoring -GI consult if further drop.  Thrombocytopenia: Resolved  Essential hypertension: Blood pressure fairly controlled -Continue home medications  Anxiety -Continue Klonopin and PRN  Stage II pressure skin injury: over his mid back. Not able to determine if present on admission -No fluctuance or signs of infection on exam. -We will continue dressing to offload pressure  Nausea and emesis -PRN Zofran -We will consider Reglan if no improvement with Zofran.  Scheduled Meds: . acetaminophen  650 mg Oral Q6H  . Chlorhexidine Gluconate Cloth  6 each Topical Daily  . fentaNYL  1 patch Transdermal Q72H  . levETIRAcetam  1,000 mg Oral Q12H  . metoprolol tartrate  25 mg Oral BID  . multivitamin with minerals  1 tablet Oral Daily  . pantoprazole  40 mg Oral Daily  . sodium chloride flush  10-40 mL Intracatheter Q12H  . vitamin C  250 mg Oral BID   Continuous Infusions: . sodium chloride Stopped (02/28/19 1732)  .  ceFAZolin (ANCEF) IV 2 g (03/03/19 0600)   . feeding supplement (JEVITY 1.2 CAL) 1,000 mL (02/28/19 1246)   PRN Meds:.sodium chloride, clonazepam, guaiFENesin, labetalol, LORazepam, [DISCONTINUED] ondansetron **OR** ondansetron (ZOFRAN) IV, Resource ThickenUp Clear, sodium chloride, sodium chloride flush   DVT prophylaxis: SCD Code Status: Partial Family Communication: Attempted to call patient's mother but no answer.  Did not leave voicemail Disposition Plan: Remains inpatient for IV antibiotics and trach collar management.  Final disposition likely home as patient refuses CIR  Consultants:   PCCM  ID  Neurology  Palliative  Nephrology  Procedures:  ETT 4/4 >> 4/17 R Femoral line 4/4 >> 4/8 R IJ Tunneled CVC (IR) 4/21 >> Trach 4/17 >>  Microbiology: . Blood culture MSSA on 4/4 . Urine culture MSSA on 4/4 . Blood culture on 4/8-negative . Respiratory culture on 5/4 with multidrug-resistant E. coli  Antimicrobials: Anti-infectives (From admission, onward)   Start     Dose/Rate Route Frequency Ordered Stop   03/01/19 1000  ceFAZolin (ANCEF) IVPB 2g/100 mL premix     2 g 200 mL/hr over 30 Minutes Intravenous Every 8 hours 02/28/19 1453 03/06/19 2359   02/27/19 1000  linezolid (ZYVOX) tablet 600 mg     600 mg Oral Every 12 hours 02/27/19 0940 02/28/19 2258   02/22/19 1015  linezolid (ZYVOX) tablet 600 mg  Status:  Discontinued     600 mg Per Tube Every 12 hours 02/22/19 1012 02/27/19 0940   02/22/19 1000  meropenem (MERREM) 2 g in sodium chloride 0.9 % 100 mL IVPB  Status:  Discontinued     2 g 200 mL/hr over 30 Minutes Intravenous Every 12 hours 02/22/19 0853 02/22/19 0934   02/22/19 1000  ceFEPIme (MAXIPIME) 2 g in sodium chloride 0.9 % 100 mL IVPB     2 g 200 mL/hr over 30 Minutes Intravenous Every 12 hours 02/22/19 0935 03/01/19 0959   02/22/19 1000  linezolid (ZYVOX) 100 MG/5ML suspension 600 mg  Status:  Discontinued     600 mg Per Tube Every 12 hours 02/22/19 0938 02/22/19 1008   02/20/19 2200   ceFEPIme (MAXIPIME) 2 g in sodium chloride 0.9 % 100 mL IVPB  Status:  Discontinued     2 g 200 mL/hr over 30 Minutes Intravenous Every 12 hours 02/20/19 1444 02/22/19 0848   02/09/19 1400  ceFAZolin (ANCEF) IVPB 2g/100 mL premix  Status:  Discontinued     2 g 200 mL/hr over 30 Minutes Intravenous Every 8 hours 02/09/19 0902 02/20/19 1444   02/08/19 1000  vancomycin (VANCOCIN) 50 mg/mL oral solution 125 mg  Status:  Discontinued     125 mg Per Tube 4 times daily 02/08/19 0921 02/20/19 0823   02/06/19 1200  fluconazole (DIFLUCAN) tablet 100 mg     100 mg Oral Daily 02/06/19 1146 02/08/19 0956   01/29/19 1000  ceFAZolin (ANCEF) IVPB 2g/100 mL premix  Status:  Discontinued     2 g 200 mL/hr over 30 Minutes Intravenous Every 12 hours 01/28/19 2145 02/09/19 0902   01/28/19 2200  ceFAZolin (ANCEF) IVPB 1 g/50 mL premix     1 g 100 mL/hr over 30 Minutes Intravenous  Once 01/28/19 2145 01/28/19 2248   01/28/19 2130  ceFAZolin (ANCEF) IVPB 2g/100 mL premix  Status:  Discontinued     2 g 200 mL/hr over 30 Minutes Intravenous Every 12 hours 01/28/19 2121 01/28/19  2145   01/20/19 2000  ceFAZolin (ANCEF) IVPB 1 g/50 mL premix  Status:  Discontinued     1 g 100 mL/hr over 30 Minutes Intravenous Every 24 hours 01/20/19 1831 01/28/19 2121   01/20/19 1400  piperacillin-tazobactam (ZOSYN) IVPB 2.25 g  Status:  Discontinued    Note to Pharmacy:  Pharmacy to dose   2.25 g 66.7 mL/hr over 30 Minutes Intravenous Every 8 hours 01/20/19 1234 01/20/19 1239   01/20/19 1400  piperacillin-tazobactam (ZOSYN) IVPB 2.25 g  Status:  Discontinued     2.25 g 100 mL/hr over 30 Minutes Intravenous Every 8 hours 01/20/19 1239 01/20/19 1831   01/20/19 0545  ceftaroline (TEFLARO) injection SOLR 300 mg  Status:  Discontinued     300 mg Intravenous  Once 01/20/19 0534 01/20/19 0535   01/20/19 0545  ceftaroline (TEFLARO) 300 mg in sodium chloride 0.9 % 250 mL IVPB     300 mg 250 mL/hr over 60 Minutes Intravenous  Once  01/20/19 0539 01/20/19 0727   01/20/19 0430  vancomycin (VANCOCIN) IVPB 1000 mg/200 mL premix  Status:  Discontinued     1,000 mg 200 mL/hr over 60 Minutes Intravenous  Once 01/20/19 0348 01/20/19 0519   01/20/19 0400  piperacillin-tazobactam (ZOSYN) IVPB 3.375 g     3.375 g 100 mL/hr over 30 Minutes Intravenous  Once 01/20/19 0348 01/20/19 0423      Objective: Vitals:   03/03/19 0002 03/03/19 0010 03/03/19 0331 03/03/19 0410  BP:  (!) 119/94  116/83  Pulse: (!) 101 98 95 94  Resp: (!) 26 (!) 31 (!) 24 (!) 23  Temp:  98.3 F (36.8 C)  98 F (36.7 C)  TempSrc:  Oral  Oral  SpO2: 99% 100% 98% 99%  Weight:      Height:        Intake/Output Summary (Last 24 hours) at 03/03/2019 0750 Last data filed at 03/02/2019 1625 Gross per 24 hour  Intake 250 ml  Output 850 ml  Net -600 ml   Filed Weights   02/25/19 0343 02/26/19 0500 02/28/19 0500  Weight: 54 kg 54.3 kg 55 kg    Examination:  GENERAL: No acute distress.  Appears well.  HEENT: MMM.  Vision and hearing grossly intact.  NECK: Supple.  No JVD.  LUNGS:  No IWOB. Good air movement bilaterally. HEART: Heart rate in the range of 90-100.  S1 and S2 audible.  Regular rhythm. ABD: Bowel sounds present. Soft. Non tender.  MSK/EXT:  Moves all extremities. No apparent deformity. No edema bilaterally.  SKIN: Pressure ulcer over mid back.  Dressing in place. NEURO: Awake, alert and oriented appropriately.  No gross deficit.  PSYCH: Calm. Normal affect.   Data Reviewed: I have independently reviewed following labs and imaging studies  CBC: Recent Labs  Lab 02/25/19 0323 02/27/19 0330 02/28/19 0517 03/01/19 0419 03/02/19 0800  WBC 5.1 4.4 6.8  --   --   HGB 7.1* 5.8* 8.8* 8.3* 9.4*  HCT 22.4* 18.8* 27.6* 26.1* 29.0*  MCV 94.1 92.2 93.6  --   --   PLT 166 141* 159  --   --    Basic Metabolic Panel: Recent Labs  Lab 02/25/19 0323 02/27/19 0330 02/28/19 0907 03/02/19 0800 03/03/19 0435  NA 145 143 137 137  --   K  4.2 4.4 4.7 3.8  --   CL 119* 115* 111 108  --   CO2 17* 17* 17* 21*  --   GLUCOSE 113* 101* 110*  98  --   BUN 65* 54* 45* 30*  --   CREATININE 1.23* 1.11* 1.25* 1.21*  --   CALCIUM 7.9* 7.8* 7.9* 7.8*  --   MG 1.7 1.5* 1.7 1.4* 2.0   GFR: Estimated Creatinine Clearance: 50.8 mL/min (A) (by C-G formula based on SCr of 1.21 mg/dL (H)). Liver Function Tests: No results for input(s): AST, ALT, ALKPHOS, BILITOT, PROT, ALBUMIN in the last 168 hours. No results for input(s): LIPASE, AMYLASE in the last 168 hours. No results for input(s): AMMONIA in the last 168 hours. Coagulation Profile: No results for input(s): INR, PROTIME in the last 168 hours. Cardiac Enzymes: No results for input(s): CKTOTAL, CKMB, CKMBINDEX, TROPONINI in the last 168 hours. BNP (last 3 results) No results for input(s): PROBNP in the last 8760 hours. HbA1C: No results for input(s): HGBA1C in the last 72 hours. CBG: Recent Labs  Lab 02/27/19 1726 02/28/19 0033 02/28/19 0743 02/28/19 1532 03/01/19 0120  GLUCAP 92 108* 94 89 99   Lipid Profile: No results for input(s): CHOL, HDL, LDLCALC, TRIG, CHOLHDL, LDLDIRECT in the last 72 hours. Thyroid Function Tests: Recent Labs    03/01/19 0419  TSH 5.552*  FREET4 0.72*   Anemia Panel: No results for input(s): VITAMINB12, FOLATE, FERRITIN, TIBC, IRON, RETICCTPCT in the last 72 hours. Urine analysis:    Component Value Date/Time   COLORURINE AMBER (A) 01/20/2019 0252   APPEARANCEUR CLOUDY (A) 01/20/2019 0252   LABSPEC 1.020 01/20/2019 0252   PHURINE 5.0 01/20/2019 0252   GLUCOSEU NEGATIVE 01/20/2019 0252   HGBUR LARGE (A) 01/20/2019 0252   BILIRUBINUR NEGATIVE 01/20/2019 0252   KETONESUR NEGATIVE 01/20/2019 0252   PROTEINUR 100 (A) 01/20/2019 0252   NITRITE NEGATIVE 01/20/2019 0252   LEUKOCYTESUR TRACE (A) 01/20/2019 0252   Sepsis Labs: Invalid input(s): PROCALCITONIN, LACTICIDVEN  Recent Results (from the past 240 hour(s))  MRSA PCR Screening      Status: None   Collection Time: 02/22/19 11:26 AM  Result Value Ref Range Status   MRSA by PCR NEGATIVE NEGATIVE Final    Comment:        The GeneXpert MRSA Assay (FDA approved for NASAL specimens only), is one component of a comprehensive MRSA colonization surveillance program. It is not intended to diagnose MRSA infection nor to guide or monitor treatment for MRSA infections. Performed at [facility], [place] [address], [place], [place] [address]       Radiology Studies: No results found.  [facility] [place] [facility] Pager [phone]  If 7PM-7AM, please contact night-coverage [email or web address] Password [email or web address] 03/03/2019, 7:50 AM

## 2019-03-04 ENCOUNTER — Inpatient Hospital Stay (HOSPITAL_COMMUNITY): Payer: Medicaid Other

## 2019-03-04 DIAGNOSIS — E876 Hypokalemia: Secondary | ICD-10-CM

## 2019-03-04 DIAGNOSIS — R7982 Elevated C-reactive protein (CRP): Secondary | ICD-10-CM

## 2019-03-04 DIAGNOSIS — R700 Elevated erythrocyte sedimentation rate: Secondary | ICD-10-CM

## 2019-03-04 LAB — HEMOGLOBIN AND HEMATOCRIT, BLOOD
HCT: 26.2 % — ABNORMAL LOW (ref 36.0–46.0)
Hemoglobin: 8.4 g/dL — ABNORMAL LOW (ref 12.0–15.0)

## 2019-03-04 LAB — BASIC METABOLIC PANEL
Anion gap: 8 (ref 5–15)
BUN: 25 mg/dL — ABNORMAL HIGH (ref 6–20)
CO2: 18 mmol/L — ABNORMAL LOW (ref 22–32)
Calcium: 7.7 mg/dL — ABNORMAL LOW (ref 8.9–10.3)
Chloride: 108 mmol/L (ref 98–111)
Creatinine, Ser: 1.36 mg/dL — ABNORMAL HIGH (ref 0.44–1.00)
GFR calc Af Amer: 60 mL/min — ABNORMAL LOW (ref >60)
GFR calc non Af Amer: 52 mL/min — ABNORMAL LOW (ref >60)
Glucose, Bld: 101 mg/dL — ABNORMAL HIGH (ref 70–99)
Potassium: 2.9 mmol/L — ABNORMAL LOW (ref 3.5–5.1)
Sodium: 134 mmol/L — ABNORMAL LOW (ref 135–145)

## 2019-03-04 LAB — SEDIMENTATION RATE: Sed Rate: 85 mm/hr — ABNORMAL HIGH (ref 0–22)

## 2019-03-04 LAB — C-REACTIVE PROTEIN: CRP: 7.7 mg/dL — ABNORMAL HIGH (ref <1.0)

## 2019-03-04 LAB — MAGNESIUM: Magnesium: 1.7 mg/dL (ref 1.7–2.4)

## 2019-03-04 MED ORDER — ENSURE ENLIVE PO LIQD
237.0000 mL | Freq: Two times a day (BID) | ORAL | Status: DC
  Administered 2019-03-04 – 2019-03-05 (×3): 237 mL via ORAL

## 2019-03-04 MED ORDER — POTASSIUM CHLORIDE 10 MEQ/100ML IV SOLN
10.0000 meq | INTRAVENOUS | Status: AC
  Administered 2019-03-04 (×4): 10 meq via INTRAVENOUS
  Filled 2019-03-04 (×4): qty 100

## 2019-03-04 MED ORDER — IOPAMIDOL (ISOVUE-300) INJECTION 61%
75.0000 mL | Freq: Once | INTRAVENOUS | Status: AC | PRN
  Administered 2019-03-04: 16:00:00 75 mL via INTRAVENOUS

## 2019-03-04 MED ORDER — POTASSIUM CHLORIDE CRYS ER 20 MEQ PO TBCR
40.0000 meq | EXTENDED_RELEASE_TABLET | ORAL | Status: DC
  Administered 2019-03-04: 40 meq via ORAL
  Filled 2019-03-04: qty 2

## 2019-03-04 MED ORDER — SODIUM CHLORIDE 0.9 % IV SOLN
INTRAVENOUS | Status: DC
  Administered 2019-03-04: 14:00:00 via INTRAVENOUS

## 2019-03-04 MED ORDER — MAGNESIUM SULFATE 2 GM/50ML IV SOLN
2.0000 g | Freq: Once | INTRAVENOUS | Status: AC
  Administered 2019-03-04: 2 g via INTRAVENOUS
  Filled 2019-03-04: qty 50

## 2019-03-04 NOTE — Progress Notes (Signed)
PROGRESS NOTE  Molly [name] OTL:[id number] DOB: Nov 13, 1985 DOA: 01/20/2019 PCP: Patient, No Pcp Per   LOS: 43 days   Patient is from: Home  Brief Narrative / Interim history: 33 y/o F with a history of IV drug abuse/polysubstance abuse (presented with urine drug screen positive for opiates and amphetamines) and a previous history in June or July 2019 of inadequately treated MRSA endocarditis having signed out AMA at that time. She had a tricuspid vegetation at that time. Admitted 4/4 hypotensive and in respiratory distress requiring intubation at [facility] long. Admitted for acute respiratory failure/septic shock/ AKI due to septic emboli with MSSA bacteremia.  Significant events 4/04 Admit with hypotension / resp distress, intubated  4/05 Family discussion >concern terminal based on sepsis, AKI, septic emboli pancytopenia  4/10 UOP improving, off pressors, low grade fevers  4/11 Agitated, PRN versed. Awake / FC. Looks best in many days. Failed SBT 4/12 Failed SBT. AKI improving, tx PRBC. Less alert on precedex, methadone  4/13 More alert, FC. On precedex. Failed SBT. ID recommending PICC for antibiotcs >mother not sure about goals of care. S/p 1 unit PRBC   4/17 Trach placement 4/21 tolerating trach collar trials. 4/23 cdiff result positive overnight with continued diarrhea 4/26 seizure 4/28 status epilepticus  5/7- Tolerating trach collar  5/8- Trach replaced- 6 cuffed  5/12- 2 units PRBCs, trach changed to 4 cuff less  Subjective: No major events overnight.  Reports having good night's sleep until she was woken up for vital signs early this morning.  Reports some pain in her back.  No chest pain or dyspnea.  She asks if the cortrak could be taken out.  She says it is interfering when she tried to eat or drink.  Patient's mother asking if she can be started and discharged on Suboxone.  Assessment & Plan: MSSA bacteremia with tricuspid valve endocarditis and septic pulmonary  emboli E. coli pneumonia -Appreciate infectious disease input -Cefepime and linezolid 5/6-5/13, and ancef through 03/06/2019 per ID -CRP and ESR elevated. -Will curbside ID  Tachycardia: Heart rate 90s to 100.  Thyroid panel not that impressive.  Anemia could contribute. -Increased metoprolol to 25 mg twice daily  On 5/15  Respiratory failure with hypoxia status post trach -Trach management per PCCM-tolerating trach collar with Passy-Muir valve  AKI: Serum creatinine 7.12 on admission.  Unknown baseline.  Likely due to septic shock.  AKI improved but serum creatinine trended up again today. -IV normal saline at 60 cc/h  Hypokalemia/hypomagnesemia: K 2.9.  Mag 1.7 -K-Dur 403 -We will give 2 g magnesium  Severe protein calorie malnutrition/dysphagia -Tolerating some oral intake. -Appreciate nutrition and SLP input -Continue multivitamin -Will discontinue Cortrak  C. difficile colitis -Completed treatment  Seizure activity/status epilepticus/PRES -Seizure-like activity on 4/26 and status epilepticus on 4/28 -No further seizures -Keppra per neurology -Dilantin discontinued 5/7 -Outpatient neurology follow-up  Anemia of chronic disease/positive Hemoccult: Hemoglobin 5.8 on 5/12 partly dilutional.  Hemoccult positive.  Received 2 units on 5/12 with appropriate response. - H&H stable. -We will continue monitoring -GI consult if further drop.  Thrombocytopenia: Resolved  Essential hypertension: Blood pressure fairly controlled -Continue home medications  Anxiety -Continue Klonopin and PRN  Stage II pressure skin injury: over his mid back. Not able to determine if present on admission.  Improving. -No fluctuance or signs of infection on exam. -We will continue dressing to offload pressure -Consult wound care.  Nausea and emesis -PRN Zofran -We will consider Reglan if no improvement with Zofran.  Scheduled  Meds: . acetaminophen  650 mg Oral Q6H  . Chlorhexidine  Gluconate Cloth  6 each Topical Daily  . fentaNYL  1 patch Transdermal Q72H  . levETIRAcetam  1,000 mg Oral Q12H  . metoprolol tartrate  25 mg Oral BID  . multivitamin with minerals  1 tablet Oral Daily  . pantoprazole  40 mg Oral Daily  . sodium chloride flush  10-40 mL Intracatheter Q12H  . vitamin C  250 mg Oral BID   Continuous Infusions: . sodium chloride Stopped (02/28/19 1732)  .  ceFAZolin (ANCEF) IV 2 g (03/04/19 0540)  . feeding supplement (JEVITY 1.2 CAL) 1,000 mL (02/28/19 1246)   PRN Meds:.sodium chloride, clonazepam, guaiFENesin, labetalol, LORazepam, [DISCONTINUED] ondansetron **OR** ondansetron (ZOFRAN) IV, Resource ThickenUp Clear, sodium chloride, sodium chloride flush   DVT prophylaxis: SCD Code Status: Partial Family Communication: Updated patient's mother on 5/16 Disposition Plan: Remains inpatient for IV antibiotics and trach collar management.  Final disposition likely home as patient refuses CIR  Consultants:   PCCM  ID  Neurology  Palliative  Nephrology  Procedures:  ETT 4/4 >> 4/17 R Femoral line 4/4 >> 4/8 R IJ Tunneled CVC (IR) 4/21 >> Trach 4/17 >>  Microbiology: . Blood culture MSSA on 4/4 . Urine culture MSSA on 4/4 . Blood culture on 4/8-negative . Respiratory culture on 5/4 with multidrug-resistant E. coli  Antimicrobials: Anti-infectives (From admission, onward)   Start     Dose/Rate Route Frequency Ordered Stop   03/01/19 1000  ceFAZolin (ANCEF) IVPB 2g/100 mL premix     2 g 200 mL/hr over 30 Minutes Intravenous Every 8 hours 02/28/19 1453 03/06/19 2359   02/27/19 1000  linezolid (ZYVOX) tablet 600 mg     600 mg Oral Every 12 hours 02/27/19 0940 02/28/19 2258   02/22/19 1015  linezolid (ZYVOX) tablet 600 mg  Status:  Discontinued     600 mg Per Tube Every 12 hours 02/22/19 1012 02/27/19 0940   02/22/19 1000  meropenem (MERREM) 2 g in sodium chloride 0.9 % 100 mL IVPB  Status:  Discontinued     2 g 200 mL/hr over 30 Minutes  Intravenous Every 12 hours 02/22/19 0853 02/22/19 0934   02/22/19 1000  ceFEPIme (MAXIPIME) 2 g in sodium chloride 0.9 % 100 mL IVPB     2 g 200 mL/hr over 30 Minutes Intravenous Every 12 hours 02/22/19 0935 03/01/19 0959   02/22/19 1000  linezolid (ZYVOX) 100 MG/5ML suspension 600 mg  Status:  Discontinued     600 mg Per Tube Every 12 hours 02/22/19 0938 02/22/19 1008   02/20/19 2200  ceFEPIme (MAXIPIME) 2 g in sodium chloride 0.9 % 100 mL IVPB  Status:  Discontinued     2 g 200 mL/hr over 30 Minutes Intravenous Every 12 hours 02/20/19 1444 02/22/19 0848   02/09/19 1400  ceFAZolin (ANCEF) IVPB 2g/100 mL premix  Status:  Discontinued     2 g 200 mL/hr over 30 Minutes Intravenous Every 8 hours 02/09/19 0902 02/20/19 1444   02/08/19 1000  vancomycin (VANCOCIN) 50 mg/mL oral solution 125 mg  Status:  Discontinued     125 mg Per Tube 4 times daily 02/08/19 0921 02/20/19 0823   02/06/19 1200  fluconazole (DIFLUCAN) tablet 100 mg     100 mg Oral Daily 02/06/19 1146 02/08/19 0956   01/29/19 1000  ceFAZolin (ANCEF) IVPB 2g/100 mL premix  Status:  Discontinued     2 g 200 mL/hr over 30 Minutes Intravenous Every 12  hours 01/28/19 2145 02/09/19 0902   01/28/19 2200  ceFAZolin (ANCEF) IVPB 1 g/50 mL premix     1 g 100 mL/hr over 30 Minutes Intravenous  Once 01/28/19 2145 01/28/19 2248   01/28/19 2130  ceFAZolin (ANCEF) IVPB 2g/100 mL premix  Status:  Discontinued     2 g 200 mL/hr over 30 Minutes Intravenous Every 12 hours 01/28/19 2121 01/28/19 2145   01/20/19 2000  ceFAZolin (ANCEF) IVPB 1 g/50 mL premix  Status:  Discontinued     1 g 100 mL/hr over 30 Minutes Intravenous Every 24 hours 01/20/19 1831 01/28/19 2121   01/20/19 1400  piperacillin-tazobactam (ZOSYN) IVPB 2.25 g  Status:  Discontinued    Note to Pharmacy:  Pharmacy to dose   2.25 g 66.7 mL/hr over 30 Minutes Intravenous Every 8 hours 01/20/19 1234 01/20/19 1239   01/20/19 1400  piperacillin-tazobactam (ZOSYN) IVPB 2.25 g  Status:   Discontinued     2.25 g 100 mL/hr over 30 Minutes Intravenous Every 8 hours 01/20/19 1239 01/20/19 1831   01/20/19 0545  ceftaroline (TEFLARO) injection SOLR 300 mg  Status:  Discontinued     300 mg Intravenous  Once 01/20/19 0534 01/20/19 0535   01/20/19 0545  ceftaroline (TEFLARO) 300 mg in sodium chloride 0.9 % 250 mL IVPB     300 mg 250 mL/hr over 60 Minutes Intravenous  Once 01/20/19 0539 01/20/19 0727   01/20/19 0430  vancomycin (VANCOCIN) IVPB 1000 mg/200 mL premix  Status:  Discontinued     1,000 mg 200 mL/hr over 60 Minutes Intravenous  Once 01/20/19 0348 01/20/19 0519   01/20/19 0400  piperacillin-tazobactam (ZOSYN) IVPB 3.375 g     3.375 g 100 mL/hr over 30 Minutes Intravenous  Once 01/20/19 0348 01/20/19 0423      Objective: Vitals:   03/04/19 0200 03/04/19 0211 03/04/19 0347 03/04/19 0453  BP:  (!) 133/105    Pulse: 100 (!) 101 (!) 102   Resp: (!) 25 (!) 26 (!) 25   Temp:    98.4 F (36.9 C)  TempSrc:    Oral  SpO2: 100% 100% 100%   Weight:      Height:        Intake/Output Summary (Last 24 hours) at 03/04/2019 0742 Last data filed at 03/03/2019 1335 Gross per 24 hour  Intake 400 ml  Output -  Net 400 ml   Filed Weights   02/25/19 0343 02/26/19 0500 02/28/19 0500  Weight: 54 kg 54.3 kg 55 kg    Examination:  GENERAL: No acute distress.  Appears well.  HEENT: MMM.  Vision and hearing grossly intact.  NECK: Supple.  No apparent JVD. LUNGS:  No IWOB. Good air movement bilaterally. HEART: Heart rate in the range of 90-100. Heart sounds normal.  ABD: Bowel sounds present. Soft. Non tender.  MSK/EXT:  Moves all extremities. No apparent deformity. No edema bilaterally.  SKIN: Stage II pressure ulcer over lumbar spine about 2 cm in diameter without fluctuance or signs of infection.  See picture below for more. NEURO: Awake, alert and oriented appropriately.  No gross deficit.  PSYCH: Calm. Normal affect.  On 03/02/19   On 03/04/19    Data Reviewed: I  have independently reviewed following labs and imaging studies  CBC: Recent Labs  Lab 02/27/19 0330 02/28/19 0517 03/01/19 0419 03/02/19 0800 03/03/19 0820  WBC 4.4 6.8  --   --   --   HGB 5.8* 8.8* 8.3* 9.4* 8.7*  HCT 18.8* 27.6*  26.1* 29.0* 26.8*  MCV 92.2 93.6  --   --   --   PLT 141* 159  --   --   --    Basic Metabolic Panel: Recent Labs  Lab 02/27/19 0330 02/28/19 0907 03/02/19 0800 03/03/19 0435  NA 143 137 137  --   K 4.4 4.7 3.8  --   CL 115* 111 108  --   CO2 17* 17* 21*  --   GLUCOSE 101* 110* 98  --   BUN 54* 45* 30*  --   CREATININE 1.11* 1.25* 1.21*  --   CALCIUM 7.8* 7.9* 7.8*  --   MG 1.5* 1.7 1.4* 2.0   GFR: Estimated Creatinine Clearance: 50.8 mL/min (A) (by C-G formula based on SCr of 1.21 mg/dL (H)). Liver Function Tests: No results for input(s): AST, ALT, ALKPHOS, BILITOT, PROT, ALBUMIN in the last 168 hours. No results for input(s): LIPASE, AMYLASE in the last 168 hours. No results for input(s): AMMONIA in the last 168 hours. Coagulation Profile: No results for input(s): INR, PROTIME in the last 168 hours. Cardiac Enzymes: No results for input(s): CKTOTAL, CKMB, CKMBINDEX, TROPONINI in the last 168 hours. BNP (last 3 results) No results for input(s): PROBNP in the last 8760 hours. HbA1C: No results for input(s): HGBA1C in the last 72 hours. CBG: Recent Labs  Lab 02/27/19 1726 02/28/19 0033 02/28/19 0743 02/28/19 1532 03/01/19 0120  GLUCAP 92 108* 94 89 99   Lipid Profile: No results for input(s): CHOL, HDL, LDLCALC, TRIG, CHOLHDL, LDLDIRECT in the last 72 hours. Thyroid Function Tests: No results for input(s): TSH, T4TOTAL, FREET4, T3FREE, THYROIDAB in the last 72 hours. Anemia Panel: No results for input(s): VITAMINB12, FOLATE, FERRITIN, TIBC, IRON, RETICCTPCT in the last 72 hours. Urine analysis:    Component Value Date/Time   COLORURINE AMBER (A) 01/20/2019 0252   APPEARANCEUR CLOUDY (A) 01/20/2019 0252   LABSPEC 1.020  01/20/2019 0252   PHURINE 5.0 01/20/2019 0252   GLUCOSEU NEGATIVE 01/20/2019 0252   HGBUR LARGE (A) 01/20/2019 0252   BILIRUBINUR NEGATIVE 01/20/2019 0252   KETONESUR NEGATIVE 01/20/2019 0252   PROTEINUR 100 (A) 01/20/2019 0252   NITRITE NEGATIVE 01/20/2019 0252   LEUKOCYTESUR TRACE (A) 01/20/2019 0252   Sepsis Labs: Invalid input(s): PROCALCITONIN, LACTICIDVEN  Recent Results (from the past 240 hour(s))  MRSA PCR Screening     Status: None   Collection Time: 02/22/19 11:26 AM  Result Value Ref Range Status   MRSA by PCR NEGATIVE NEGATIVE Final    Comment:        The GeneXpert MRSA Assay (FDA approved for NASAL specimens only), is one component of a comprehensive MRSA colonization surveillance program. It is not intended to diagnose MRSA infection nor to guide or monitor treatment for MRSA infections. Performed at [facility], [place] [address] [place], [place] [address]       Radiology Studies: No results found.  Illias Pantano T. [facility] Pager [phone]  If 7PM-7AM, please contact night-coverage [email or web address] Password [facility] 03/04/2019, 7:42 AM

## 2019-03-04 NOTE — Progress Notes (Signed)
I requested that pt use the bedpan and then sit in the recliner chair for lunch.  During these activities pt called me a bitch twice and used the "f"word.  I told the patient that it was not okay to swear around or at me, and she told me that she did not want me as her nurse.  I told her that was not her choice and that someone else would help her get in the chair and that I would come back after she had settled down. Charge nurse, Olegario Shearer was informed.

## 2019-03-05 LAB — BASIC METABOLIC PANEL
Anion gap: 9 (ref 5–15)
BUN: 21 mg/dL — ABNORMAL HIGH (ref 6–20)
CO2: 16 mmol/L — ABNORMAL LOW (ref 22–32)
Calcium: 7.7 mg/dL — ABNORMAL LOW (ref 8.9–10.3)
Chloride: 110 mmol/L (ref 98–111)
Creatinine, Ser: 1.41 mg/dL — ABNORMAL HIGH (ref 0.44–1.00)
GFR calc Af Amer: 57 mL/min — ABNORMAL LOW (ref >60)
GFR calc non Af Amer: 50 mL/min — ABNORMAL LOW (ref >60)
Glucose, Bld: 158 mg/dL — ABNORMAL HIGH (ref 70–99)
Potassium: 3.5 mmol/L (ref 3.5–5.1)
Sodium: 135 mmol/L (ref 135–145)

## 2019-03-05 LAB — HEMOGLOBIN AND HEMATOCRIT, BLOOD
HCT: 27.4 % — ABNORMAL LOW (ref 36.0–46.0)
Hemoglobin: 8.9 g/dL — ABNORMAL LOW (ref 12.0–15.0)

## 2019-03-05 LAB — MAGNESIUM: Magnesium: 2.1 mg/dL (ref 1.7–2.4)

## 2019-03-05 MED ORDER — COLLAGENASE 250 UNIT/GM EX OINT
TOPICAL_OINTMENT | Freq: Every day | CUTANEOUS | Status: DC
  Administered 2019-03-05 – 2019-03-06 (×2): via TOPICAL
  Filled 2019-03-05 (×2): qty 30

## 2019-03-05 MED ORDER — OXYCODONE HCL 5 MG PO TABS
5.0000 mg | ORAL_TABLET | Freq: Four times a day (QID) | ORAL | Status: DC | PRN
  Administered 2019-03-06: 5 mg via ORAL
  Filled 2019-03-05 (×2): qty 1

## 2019-03-05 MED ORDER — BOOST / RESOURCE BREEZE PO LIQD CUSTOM
1.0000 | Freq: Three times a day (TID) | ORAL | Status: DC
  Administered 2019-03-05: 17:00:00 1 via ORAL

## 2019-03-05 MED ORDER — METOPROLOL SUCCINATE ER 50 MG PO TB24
50.0000 mg | ORAL_TABLET | Freq: Every day | ORAL | Status: DC
  Administered 2019-03-05: 11:00:00 50 mg via ORAL
  Filled 2019-03-05 (×2): qty 1

## 2019-03-05 NOTE — Discharge Instructions (Addendum)
[facility] Stay Proper nutrition can help your body recover from illness and injury.   Foods and beverages high in protein, vitamins, and minerals help rebuild muscle loss, promote healing, & reduce fall risk.   In addition to eating healthy foods, a nutrition shake is an easy, delicious way to get the nutrition you need during and after your hospital stay  It is recommended that you continue to drink 3 bottles per day of:       Ensure Plus for at least 1 month (30 days) after your hospital stay   Tips for adding a nutrition shake into your routine: As allowed, drink one with vitamins or medications instead of water or juice Enjoy one as a tasty mid-morning or afternoon snack Drink cold or make a milkshake out of it Drink one instead of milk with cereal or snacks Use as a coffee creamer   Available at the following grocery stores and pharmacies:           * [place] [phone]            For COUPONS visit: www.ensure.com/join or http://dawson-may.com/   Suggested Substitutions Ensure Plus = Boost Plus = Carnation Breakfast Essentials = Boost Compact Ensure Active Clear = Boost Breeze Glucerna Shake = Boost Glucose Control = Carnation Breakfast Essentials SUGAR FREE  Suggestions For Increasing Calories And Protein  Several small meals a day are easier to eat and digest than three large ones. Space meals about 2 to 3 hours apart to maximize comfort.  Stop eating 2 to 3 hours before bed and sleep with your head elevated if gastric reflux (GERD) and heartburn are problems.  Do not eat your favorite foods if you are feeling bad. Save them for when you feel good!  Eat breakfast-type foods at any meal. Eggs are usually easy to eat and are great any time of the day. (The same goes for pancakes and  waffles.)  Eat when you feel hungry. Most people have the greatest appetite in the morning because they have not eaten all night. If this is the best meal for you, then pile on those calories and other nutrients in the morning and at lunch. Then you can have a smaller dinner without losing total calories for the day.  Eat leftovers or nutritious snacks in the afternoon and early evening to round out your day.  Try homemade or commercially prepared nutrition bars and puddings, as well as calorie- and protein-rich liquid nutritional supplements. Benefits of Physical Activity Talk to your doctor about physical activity. Light or moderate physical activity can help maintain muscle and promote an appetite. Walking in the neighborhood or the local mall is a great way to get up, get out, and get moving. If you are unsteady on your feet, try walking around the dining room table. Save Room for [employer]! Drink most fluids between meals instead of with meals. (It is fine to have a sip to help swallow food at meal time.) Fluids (which usually have fewer calories and nutrients than solid food) can take up valuable space in your stomach.  Foods Recommended High-Protein Foods Milk products Add cheese to toast, crackers, sandwiches, baked potatoes, vegetables, soups,  noodles, meat, and fruit. Use reduced-fat (2%) or whole milk in place of water when cooking cereal and cream soups. Include cream sauces on vegetables and pasta. Add powdered milk to cream soups and mashed potatoes.  Eggs Have hard-cooked eggs readily available in the refrigerator. Chop and add to salads, casseroles, soups, and vegetables. Make a quick egg salad. All eggs should be well cooked to avoid the risk of harmful bacteria.  Meats, poultry, and fish Add leftover cooked meats to soups, casseroles, salads, and omelets. Make dip by mixing diced, chopped, or shredded meat with sour cream and spices.  Beans, legumes, nuts, and seeds  Sprinkle nuts and seeds on cereals, fruit, and desserts such as ice cream, pudding, and custard. Also serve nuts and seeds on vegetables, salads, and pasta. Spread peanut butter on toast, bread, English muffins, and fruit, or blend it in a milk shake. Add beans and peas to salads, soups, casseroles, and vegetable dishes.  High-Calorie Foods Butter, margarine, and  oils Melt butter or margarine over potatoes, rice, pasta, and cooked vegetables. Add melted butter or margarine into soups and casseroles and spread on bread for sandwiches before spreading sandwich spread or peanut butter. Saut or stir-fry vegetables, meats, chicken and fish such as shrimp/scallops in olive or canola oil. A variety of oils add calories and can be used to [employer], chicken, or fish.  Milk products Add whipping cream to desserts, pancakes, waffles, fruit, and hot chocolate, and fold it into soups and casseroles. Add sour cream to baked potatoes and vegetables.  Salad dressing Use regular (not low-fat or diet) mayonnaise and salad dressing on sandwiches and in dips with vegetables and fruit.   Sweets Add jelly and honey to bread and crackers. Add jam to fruit and ice cream and as a topping over cake.   Copyright 2020  Academy of Nutrition and Dietetics. All rights reserved.  Buprenorphine-naloxone instructions  Instruction for starting buprenorphine-naloxone (Suboxone) at home  You should not mix buprenorphine-naloxone with other drugs especially large amounts of alcohol or benzodiazepines (Valium, Klonopin, Xanax, Ativan). If you have taken any of these medication, please tell your healthcare team and do not take buprenorphine-naloxone.   You must wait until you are feeling signs of withdrawal from opiates (heroin, pain pills) before you take buprenorphine-naloxone.  If you do not wait long enough the medication will make you sicker.  If you do take it too soon and get sicker then wait until later when you feel signs  of withdrawal listed below and then try again.   Signs that you are withdrawing: ? Anxiety, restlessness, cant sit still ? Aches ? Nausea or sick to your stomach ? Goose-bumps ? Racing heart   You should have ALL of these symptoms before you start taking your first dose of buprenorphine-naloxone. If you are not sure call your healthcare team.    When its time to take your first dose 1. Split your pill or film in half 2. Make sure your mouth is empty of everything (no candy/gum/etc) 3. Sit or stand, but do not lie down 4. Swallow a sip of water to wet your mouth  5. Put the half of the tablet or film under your tongue. Do not suck or swallow it. It must stay there until it is completely dissolved. Try to not even swallow your spit during this time. Anything that you swallow will not make you feel better.   In 20 minutes: You should start feeling a little better. If  you feel worse then you started too early so you would wait a few hours and then try again later.    In one hour: You can take the other half of the pill or film the same way you took the first one.   In 2 hours: if you are still feeling symptoms of withdrawal listed above you can take another half a pill or film. You can repeat this if needed until you take a total of 2 pills or 2 films (16mg ). You may need less than this to control your symptoms.  You should adjust your dose so that you are taking one and half or two pills or films per day (12-16mg  per day). At this dose you should have cut down on cravings and help with any withdrawal symptoms.   The next day:  In the morning you can take the same amount you took yesterday all at one time in the morning.  Expect a call from your team to see how you are doing.   If you have any questions or concerns at any time call your healthcare team.  Clinic Number:  [place]: [phone] After Hours Number: [phone] - Leave your number and expect a call back from a  physician.    After Tracheostomy removal Instructions:  -keep current dressing in place for next 48 hrs -after 48 hrs place a sterile bandage over stoma until completely closed.  -do not submerge yourself under water until trach stoma completely closed  -routine skin care w/ soap and water around stoma site.  -may need to place finger over bandage to assist w/ voice quality for next 24-48hrs -please feel free to call trach clinic @ [phone] w/ questions or concerns  -call trach clinic at [phone] if trach stoma not completely closed after 2 weeks. You will need to be seen if this is the case and we will need to refer you to ENT.    Erick Colace ACNP-BC [place]

## 2019-03-05 NOTE — Consult Note (Signed)
Physical Medicine and Rehabilitation Consult   Reason for Consult: Debility Referring Physician: Dr. Cyndia Skeeters   HPI: Molly [name] is a 33 y.o. 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 She was intubated in ED and required pressors. UDS positive for opiates and amphetamines. Work up revealed MSSA bacteremia and CT chest showed bilateral pulmonary  nodules c/w septic emboli as well as concerns of RLL aspiration PNA. She was started on broad spectrum antibiotics and 2 D echo done revealing tricuspid vegetation with severe TVR.  Dr. Jonnie Finner recommended supportive care for management of AKI as not a candidate for HD.   BLE dopplers 4/20 done due to ongoing fevers and were negative for DVT.  ID consulted for input and recommended 6 weeks antibiotic regimen--narrowed to Cefazolin with end date 5/19.  She developed diarrhea with stools positive for C diff on 4/23.  Family consulted for input on [place] and elected limited code. She had issues with agitation with difficulty and difficulty with vent wean requiring trach 4/17. She did have decline in MS due to seizures with status epilepticus--MRI brain 4/26 showed scattered abnormal flare involving bilateral cerebral and cerebellar hemispheres most compatible with PRES and associated scattered petechial hemorrhages.  Dr. Rory Percy consulted and patient loaded with Keppra and required addition of dilantin X 7 days. Recommendations to maintain patient on 1000 mg  bid with recommendations to follow up with neurology on outpatient basis. She tolerated extubation to ATC and was downsized to CFS#4 by 5/12 and is tolerating PMSV.  Plans to start plugging trach with  decannulation in 1-2 days. Her diet has been advanced to regular diet with nectar thick liquids 5/15. Therapy ongoing and patient noted to be severely deconditioned. HR trending to 120's with standing attempts. CIR recommended due to functional decline.     Review of Systems  Constitutional: Negative for chills and fever.  HENT: Negative for hearing loss.   Eyes: Negative for blurred vision and double vision.  Respiratory: Negative for cough.   Cardiovascular: Negative for chest pain.  Gastrointestinal: Positive for diarrhea. Negative for constipation.  Genitourinary: Negative for dysuria, frequency and urgency.  Musculoskeletal: Negative for myalgias.  Skin: Negative for itching and rash.  Neurological: Negative for dizziness and headaches.     Past Medical History:  Diagnosis Date  . 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 Abscess of skin    buttock - most recently 2009/10  . CKD (chronic kidney disease) stage 3, GFR 30-59 ml/min (HCC)   . Drug-seeking behavior   . Hypersplenism   . IV drug user   . MRSA (methicillin resistant Staphylococcus aureus) infection   . MRSA bacteremia   . Pancytopenia ([place])   . 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 Septic pulmonary embolism ([place])   . Tobacco abuse     Past Surgical History:  Procedure Laterality Date  . IR FLUORO GUIDE CV LINE RIGHT  02/05/2019  . IR GENERIC HISTORICAL  07/19/2016   IR LUMBAR DISC ASPIRATION W/IMG GUIDE 07/19/2016 Arne Cleveland, MD [facility]INTERV RAD  . IR US GUIDE VASC ACCESS RIGHT  02/05/2019  . RADIOLOGY WITH ANESTHESIA N/A 04/11/2018   Procedure: MRI OF LUMBAR AND THORACIC SPINE WITHOUT CONTRAST WITH ANESTHESIA;  Surgeon: Radiologist, Medication, MD;  Location: [facility];  Service: Radiology;  Laterality: N/A;  . TEE WITHOUT CARDIOVERSION N/A 07/04/2013   Procedure: TRANSESOPHAGEAL ECHOCARDIOGRAM (TEE);  Surgeon: Larey Dresser, MD;  Location: [facility] ENDOSCOPY;  Service: Cardiovascular;  Laterality: N/A;  .  TONSILLECTOMY     33 years old    Family History  Problem Relation Age of Onset  . Alcoholism Father     Social History: Lives with mother. Independent PTA. She  reports that she has been smoking cigarettes. She has a 3.50 pack-year smoking history. She has never used smokeless tobacco. Per reports current drug use. Drugs: IV and Oxycodone. She reports that she does not drink alcohol.   Allergies  Allergen Reactions  . Lactose Intolerance (Gi)   . Vancomycin Other (See Comments)    Possible contributor to AKI and thrombocytopenia    Medications Prior to Admission  Medication Sig Dispense Refill  . ibuprofen (ADVIL,MOTRIN) 200 MG tablet Take 200-400 mg by mouth every 6 (six) hours as needed for moderate pain.     Marland Kitchen senna-docusate (SENOKOT-S) 8.6-50 MG tablet Take 1 tablet by mouth at bedtime as needed for mild constipation. (Patient not taking: Reported on 07/12/2017) 15 tablet 0    Home: Home Living Family/patient expects to be discharged to:: Private residence Living Arrangements: Parent Additional Comments: Pt unable to give details of set up  Functional History: Prior Function Level of Independence: Independent Functional Status:  Mobility: Bed Mobility Overal bed mobility: Needs Assistance Bed Mobility: Supine to Sit, Rolling Rolling: Min assist Supine to sit: Max assist Sit to supine: Max assist Sit to sidelying: Max assist, +2 for physical assistance, +2 for safety/equipment General bed mobility comments: up in recliner Transfers Overall transfer level: Needs assistance Equipment used: Rolling walker (2 wheeled) Transfer via Lift Equipment: Stedy Transfers: Sit to/from Stand Sit to Stand: Mod assist Stand pivot transfers: Mod assist Squat pivot transfers: Mod assist General transfer comment: requires cues to move feet Ambulation/Gait Ambulation/Gait assistance: Mod assist, +2 physical assistance, +2 safety/equipment Gait Distance (Feet): 5 Feet Assistive  device: 2 person hand held assist Gait Pattern/deviations: Step-to pattern, Leaning posteriorly, Trunk flexed General Gait Details: limited to transfers due to incontinence of stool Gait velocity: Decreased    ADL: ADL Overall ADL's : Needs assistance/impaired Eating/Feeding: Set up, Bed level(HOB up) Eating/Feeding Details (indicate cue type and reason): broth with spoon Grooming: Set up Upper Body Bathing: Maximal assistance, Bed level Lower Body Bathing: Total assistance, Bed level Upper Body Dressing : Moderate assistance Lower Body Dressing: Total assistance, Bed level Lower Body Dressing Details (indicate cue type and reason): Pt able to bring ankles to knees while supine in bed and don socks with significant amount of time Toilet Transfer: Total assistance Toilet Transfer Details (indicate cue type and reason): Mod A +2 to power up and maintain standing. Use of stedy. simulated to [place] and Hygiene: Total assistance, Sitting/lateral lean Toileting - Clothing Manipulation Details (indicate cue type and reason): Pt standing with Min A +2 and then requiring Max A for toilet hygiene with bowel Functional mobility during ADLs: Maximal assistance General ADL Comments: Pt sitting EOB and transferred to recliner to grooming w/ set-upA  Cognition: Cognition Overall Cognitive Status: No family/caregiver present to determine baseline cognitive functioning Orientation Level: Oriented X4 Cognition Arousal/Alertness: Awake/alert Behavior During Therapy: Flat affect Overall Cognitive Status: No family/caregiver present to determine baseline cognitive functioning Area of Impairment: Safety/judgement, Awareness Following Commands: Follows one step commands consistently Safety/Judgement:  Decreased awareness of safety Awareness: Intellectual Problem Solving: Requires verbal cues General Comments: decreased recall of safety precautions, pt verbalized  frustration at not being able to see her mother and became teary about this, she also requested to go outside Difficult to assess due to: Tracheostomy   Blood pressure (!) 133/109, pulse (!) 108, temperature 99 F (37.2 C), temperature source Oral, resp. rate (!) 26, height 5\' 1"  (1.549 m), weight 55 kg, SpO2 98 %. Physical Exam  Nursing note and vitals reviewed. Constitutional: She is oriented to person, place, and time. She appears well-developed and well-nourished.  CFS in place. [place] in place--multiple soda cans with a cup of ice on bedside table. Up in bed asking about going home tomorrow.   HENT:  Head: Normocephalic.  Eyes: Pupils are equal, round, and reactive to light.  Neck:  Trach in place with PMSV.   Cardiovascular: Tachycardia present. Exam reveals no friction rub.  No murmur heard. Respiratory: Effort normal. No respiratory distress. She has no wheezes.  GI: Soft. She exhibits no distension. There is no abdominal tenderness.  Musculoskeletal:        General: No edema.     Comments: Healed lesions LLE.   Neurological: She is alert and oriented to person, place, and time.  Moves all 4's  3-4/5. Senses pain. Follows basic commands. Decreased insight and awareness. Oriented to person, hospital.   Skin: Skin is warm.  Psychiatric:  Flat, distracted at times    Results for orders placed or performed during the hospital encounter of 01/20/19 (from the past 24 hour(s))  C-reactive protein     Status: Abnormal   Collection Time: 03/04/19 10:15 AM  Result Value Ref Range   CRP 7.7 (H) <1.0 mg/dL   No results found.   Assessment/Plan: Diagnosis: Functional deficits secondary to debility after endocarditis, VDRF, and c diff colitis, and multiple medical; PRES, seizures.  1. Does the need for close, 24 hr/day medical supervision in concert with the patient's rehab needs mak3e it unreasonable for this patient to be served in a less intensive setting? Yes 2. Co-Morbidities  requiring supervision/potential complications: polysubstance abuse, trach in place, nutrition/dysphagia 3. Due to bladder management, bowel management, safety, skin/wound care, disease management, medication administration, pain management and patient education, does the patient require 24 hr/day rehab nursing? Yes 4. Does the patient require coordinated care of a physician, rehab nurse, PT (1-2 hrs/day, 5 days/week), OT (1-2 hrs/day, 5 days/week) and SLP (1-2 hrs/day, 5 days/week) to address physical and functional deficits in the context of the above medical diagnosis(es)? Yes Addressing deficits in the following areas: balance, endurance, locomotion, strength, transferring, bowel/bladder control, bathing, dressing, feeding, grooming, toileting, cognition, speech, swallowing and psychosocial support 5. Can the patient actively participate in an intensive therapy program of at least 3 hrs of therapy per day at least 5 days per week? Yes 6. The potential for patient to make measurable gains while on inpatient rehab is excellent 7. Anticipated functional outcomes upon discharge from inpatient rehab are supervision and min assist  with PT, supervision and min assist with OT, supervision with SLP. 8. Estimated rehab length of stay to reach the above functional goals is: 20-25 days 9. Anticipated D/C setting: Home 10. Anticipated post D/C treatments: [facility] therapy 11. Overall Rehab/Functional Prognosis: excellent  RECOMMENDATIONS: This patient's condition is appropriate for continued rehabilitative care in the following setting: CIR Patient has agreed to participate in recommended program. Yes Note that insurance prior authorization may be required for reimbursement  for recommended care.  Comment: Rehab Admissions Coordinator to follow up.  Thanks,  Meredith Staggers, MD, Mellody Drown  I have personally performed a face to face diagnostic evaluation of this patient. Additionally, I have examined pertinent  labs and radiographic images. I have reviewed and concur with the physician assistant's documentation above.  03/05/2019    Bary Leriche, PA-C 03/05/2019

## 2019-03-05 NOTE — Progress Notes (Addendum)
Nutrition Follow-up  RD working remotely.  DOCUMENTATION CODES:   Not applicable  INTERVENTION:   -Continue MVI with minerals daily -Continue Ensure Enlive po BID, each supplement provides 350 kcal and 20 grams of protein -Boost Breeze po TID, each supplement provides 250 kcal and 9 grams of protein -Continue 250 mg vitamin C BID -D/c Jevity 1.2, as pt no longer has feeding access -Provided "Nutrition [facility] Stay" handout to pt on suggestions of appropriate nutritional supplements and where to purchase at discharge; added text to AVS/discharge instructions -Provided "Suggestions for Increasing Calories and Protein" handout from [facility]; added text to AVS/discharge instructions  NUTRITION DIAGNOSIS:   Inadequate oral intake related to acute illness, dysphagia, other (see comment)(current diet order) as evidenced by other (comment)(CLD, nectar-thick).  Progressing; advanced to regular diet with thin liquids  GOAL:   Patient will meet greater than or equal to 90% of their needs  Progressing  MONITOR:   PO intake, Supplement acceptance, Labs, Weight trends, Skin, I & O's  REASON FOR ASSESSMENT:   Ventilator    ASSESSMENT:    33 y/o female with h/o IVDU/polysubstance abuse and MRSA bacteremia/TV endocarditis/spinal osteomyelitis/septic pulmonary emboli (left AMA July 2019) who presented to the ED with AMS found to be septic with hypothermia, hypotension, and hypoxia.   4/04- admit, intubated 4/17- trach placed, Cortrak placed at LOT 4/21- Cortrak tube tip pulled back to gastric placement 4/23-MBS with diet advancement to Regularwith nectar-thick liquids; C.diff + 4/26- seizures 4/28- status epilepticus, back on vent 5/01 - diet advanced from NPO to FLD, nectar thick 5/03 - diet changed to CLD, nectar thick d/t lactose intolerance 5/11- cortrack tube clogged, replaced by IR 5/12- trach downsized to #4 uncuffed shiley 5/13- transferred from  ICU to PCU 5/18- s/p CWOCN c/s- unstageable pressure injury to lumbar spine; s/p BSE- advanced to regular diet with thin liquids   Reviewed I/O's: +570 ml x 24 hours and +1.4 L since 02/19/19  UOP: 900 ml x 24 hours  Pt remains on trach collar; per PCCM notes, plan to start capping trials.   Per SLP notes, pt had been consuming clear liquids without difficulty over the weekend. She has been consuming 25-50% of meals, however, complains of irritation of cortrak tube when trying to eat.   Pt has been refusing tube feedings since transfer to floor on 02/28/19. Now that pt has been advanced to thin liquids, there are many more oral nutrition supplements available to her that will assist her in meeting her needs. Pt has been consuming a lot of sodas and juices off of her trays.   Spoke with MD, who reports plan to remove cortrak tube today. Pt will likely be discharged home tomorrow. She is refusing CIR or SNF.   Labs reviewed: Na: 134, K: 2.9.   Diet Order:   Diet Order            Diet regular Room service appropriate? Yes with Assist; Fluid consistency: Thin  Diet effective now              EDUCATION NEEDS:   Not appropriate for education at this time  Skin:  Skin Assessment: Skin Integrity Issues: Skin Integrity Issues:: Unstageable DTI: sacrum- epithelialized Stage II: back Unstageable: lumbar spine Other: MASD medial groin  Last BM:  03/02/19  Height:   Ht Readings from Last 1 Encounters:  02/28/19 5\' 1"  (1.549 m)    Weight:   Wt Readings from Last 1 Encounters:  02/28/19 55  kg    Ideal Body Weight:  47.7 kg  BMI:  Body mass index is 22.91 kg/m.  Estimated Nutritional Needs:   Kcal:  1700-1900  Protein:  110-125 grams  Fluid:  > 2 L    Margaret Cockerill A. Jimmye Norman, RD, LDN, [place] Registered Dietitian II Certified Diabetes Care and Education Specialist Pager: [phone] After hours Pager: [phone]

## 2019-03-05 NOTE — Progress Notes (Signed)
NAME:  Molly [name], MRN:  [id number], DOB:  12-11-1985, LOS: 99 ADMISSION DATE:  01/20/2019, CONSULTATION DATE: 01/20/2019 REFERRING MD:  Bonner Puna, CHIEF COMPLAINT:  Sepsis  Brief History   33 y/o F with a history of IV drug abuse/polysubstance abuse (presented with urine drug screen positive for opiates and amphetamines) and a previous history in June or July 2019 of inadequately treated MRSA endocarditis having signed out AMA at that time.  She had a tricuspid vegetation at that time.  Admitted 4/4 hypotensive and in respiratory distress requiring intubation at [facility] long.   Admitted for acute respiratory failure/septic shock/ AKI due to septic emboli with MSSA bacteremia.  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 Awake / FC. Looks best in many days. Failed SBT 4/12 Failed SBT.  AKI improving, tx PRBC.  Less alert on precedex, methadone  4/13 More alert, FC. On precedex. Failed SBT. ID recommending PICC for antibiotcs > mother not sure about goals of care. S/p 1 unit PRBC   4/17 Trach placement- 6 cuffed 4/21 Tolerating trach collar trials. 4/23 cdiff result positive overnight with continued diarrhea 4/26 seizure 4/28 status epilepticus 5/7- Tolerating trach collar 5/8- Trach replaced- 6 cuffed 5/12- 2 units PRBCs, trach changed to 4 cuff less  Consults:  Nephrology Neurology  Procedures:  ETT 4/4 >> 4/17 R Femoral line 4/4 >> 4/8 R IJ Tunneled CVC (IR) 4/20 >>  Trach 4/17 >>  Significant  Diagnostic Tests:  Echocardiogram 4/4 >> TV vegetation measures 1.5 mm x 0.6 mm., severe TR CT Chest 4/4 >> bilateral pulmonary nodules, peripheral and cavitated.  LE venous duplex 4/20 >> negative MRI brain 4/26 >> scattered T2/FLAIR most in parieto-occipital region b/l EEG 4/27 >> no evidence of seizures  Micro Data:  Blood cultures x2 4/4 >> MSSA  BCx2 4/8 >> negative   C diff 4/22 >> antigen positive, toxin negative, PCR positive 4/28 urine cx: neg 5/4 trach cx: ecoli, VRE  Antimicrobials:  Vancomycin Zosyn Teflaro >> stopped Ancef 4/4 > 5/5 Diflucan 4/21 >> 4/24 Vancomycin po 4/23 >> 5/5   Cefepime 5/5>> 5/13 Linezolid 5/7 >> 5/13 Ancef 5/13 >>   Interim history/subjective:  Remains on ATC with no issues. Anxious and states she is wanting to go home tomorrow 5/19.  Objective   Blood pressure (!) 133/109, pulse (!) 112, temperature 99 F (37.2 C), temperature source Oral, resp. rate (!) 22, height 5\' 1"  (1.549 m), weight 55 kg, SpO2 90 %.    FiO2 (%):  [21 %] 21 %   Intake/Output Summary (Last 24 hours) at 03/05/2019 0835 Last data filed at 03/05/2019 0000 Gross per 24 hour  Intake 1470 ml  Output 900 ml  Net 570 ml   Filed Weights   02/25/19 0343 02/26/19 0500 02/28/19 0500  Weight: 54 kg 54.3 kg 55 kg   Exam: General: Cachectic female, resting comfortably HEENT: Tracheostomy C/D/I, PMV in place, strong phonation Neuro: Grossly intact CV: RRR, no m/r/g PULM: even/non-labored, lungs bilaterally rhonchi LH:TDSK, non-tender, bsx4 active  Extremities: warm/dry, negative edema  Skin: no rashes or lesions   Resolved Hospital Problem list   Septic Shock, thrush  Active Problem list   Acute resp failure with hypoxia s/p trach - tolerating ATC without issues Septic pulmonary emboi Start capping trials, if tolerates for 48 hours then can decannulate Continue bronchial hygiene  Severe protein calorie malnutrition Dysphagia Continue dysphagia diet and  supplement tube feedings due to severe malnutrition  New onset seizure 4/26 Status epilepticus 4/28 Continue Keppra per neurology's instructions Dilantin stopped on 02/22/2019 per neurology's input  MSSA bacteremia with tricuspid valve endocarditis E coli and VRE PNA C. difficile colitis - s/p treatment Antibiotics per ID  Rest per primary team.  Best practice:  Diet: dysphagia + tube feed supplement DVT prophylaxis: SQ heparin GI prophylaxis: Protonix Mobility: oob with assistance Code Status: Limited code Disposition: Progressive   PCCM will see again 5/20.  If has problems during capping trial, please call back sooner.  Montey Hora, [place] [facility] Pager: [phone].  If no answer, [phone] 03/05/2019, 9:18 AM

## 2019-03-05 NOTE — Progress Notes (Signed)
Pt trach is capped.  Pt is tolerating fine.

## 2019-03-05 NOTE — Progress Notes (Addendum)
Inpatient Rehabilitation Admissions Coordinator  Inpatient rehab consult received. I met with patient at bedside for assessment. She states she is going home in a few days. Does not want to discuss rehab. She gave me permission to contact her Mom, which I have left a voicemail. Noted plans to de cannulate and d/c cortrak. I will follow up tomorrow.  Danne Baxter, RN, MSN Rehab Admissions Coordinator [phone] 03/05/2019 4:41 PM   I spoke with pt's Mom by phone and she is taking FMLA to care for daughter when she is d/c'd . Mom is aware that pt refuses any rehab and Mom is prepared to take her home once she is de cannulated. We will sign off at this time.

## 2019-03-05 NOTE — Progress Notes (Signed)
Physical Therapy Treatment Patient Details Name: Molly [name] MRN: [id number] DOB: [birth year] Today's Date: 03/05/2019    History of Present Illness Pt adm for acute respiratory failure due to Septic pulmonary embolus due to MRSA tricuspid vegetation. Pt intubated 01/20/19. Pt with multiple seizures 4/28-4/29. 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 Pt tolerated gait training distance of 100 ft with use of RW and requires min/mod A for OOB. Pt will continue to benefit from further skilled PT services to maximize independence and safety with mobility.     Follow Up Recommendations  CIR     Equipment Recommendations  Other (comment)(TBD at next venue)    Recommendations for Other Services       Precautions / Restrictions Precautions Precautions: Fall Precaution Comments: trach, cortrak    Mobility  Bed Mobility Overal bed mobility: Needs Assistance Bed Mobility: Supine to Sit;Rolling Rolling: Min guard   Supine to sit: Min assist;HOB elevated     General bed mobility comments: cues for use of rail; pt reaching for therapist to assist with sitting up; assistance required to elevate trunk into sitting  Transfers Overall transfer level: Needs assistance Equipment used: 1 person hand held assist;None Transfers: Sit to/from [facility] Sit to Stand: Min assist Stand pivot transfers: Min assist       General transfer comment: assist to steady; cues for safety when pivoting to [facility]  Ambulation/Gait Ambulation/Gait assistance: Min assist;Mod assist Gait Distance (Feet): 100 Feet Assistive device: Rolling walker (2 wheeled) Gait Pattern/deviations: Step-to pattern;Trunk flexed;Decreased step length - right;Decreased step length - left;Narrow base of support Gait velocity: Decreased   General Gait Details: cues for  upright posture and navigating environment; assist for balance and to guide RW   Stairs             Wheelchair Mobility    Modified Rankin (Stroke Patients Only)       Balance Overall balance assessment: Needs assistance Sitting-balance support: Feet supported;Single extremity supported Sitting balance-Leahy Scale: Fair     Standing balance support: Bilateral upper extremity supported Standing balance-Leahy Scale: Poor                              Cognition Arousal/Alertness: Awake/alert Behavior During Therapy: Flat affect Overall Cognitive Status: No family/caregiver present to determine baseline cognitive functioning Area of Impairment: Safety/judgement;Problem solving                         Safety/Judgement: Decreased awareness of safety   Problem Solving: Requires verbal cues        Exercises      General Comments General comments (skin integrity, edema, etc.): elevated HR throughout at rest and with mobility      Pertinent Vitals/Pain Pain Assessment: Faces Faces Pain Scale: Hurts little more Pain Descriptors / Indicators: Grimacing;Guarding Pain Intervention(s): Limited activity within patient's tolerance;Monitored during session;Repositioned;Premedicated before session    Home Living                      Prior Function            PT Goals (current goals can now be found in the care plan section) Progress towards PT goals: Progressing toward goals    Frequency    Min 3X/week  PT Plan Current plan remains appropriate    Co-evaluation              AM-PAC PT "6 Clicks" Mobility   Outcome Measure  Help needed turning from your back to your side while in a flat bed without using bedrails?: None Help needed moving from lying on your back to sitting on the side of a flat bed without using bedrails?: A Little Help needed moving to and from a bed to a chair (including a wheelchair)?: A Little Help  needed standing up from a chair using your arms (e.g., wheelchair or bedside chair)?: A Little Help needed to walk in hospital room?: A Little Help needed climbing 3-5 steps with a railing? : A Little 6 Click Score: 19    End of Session Equipment Utilized During Treatment: Gait belt Activity Tolerance: Patient tolerated treatment well Patient left: in chair;with call bell/phone within reach;with chair alarm set Nurse Communication: Mobility status PT Visit Diagnosis: Other abnormalities of gait and mobility (R26.89);Muscle weakness (generalized) (M62.81);Unsteadiness on feet (R26.81);Difficulty in walking, not elsewhere classified (R26.2)     Time: 2956-2130 PT Time Calculation (min) (ACUTE ONLY): 29 min  Charges:  $Gait Training: 23-37 mins                     Molly [name], Molly [name] Acute Rehabilitation Services Pager: [phone] Office: [phone]     Molly [name] 03/05/2019, 1:16 PM

## 2019-03-05 NOTE — Progress Notes (Signed)
  Speech Language Pathology Treatment: Dysphagia  Patient Details Name: Molly [name] MRN: [id number] DOB: 1986-08-09 Today's Date: 03/05/2019 Time: 5427-0623 SLP Time Calculation (min) (ACUTE ONLY): 8 min  Assessment / Plan / Recommendation Clinical Impression  Pt has been consuming thin liquids over the weekend without difficulty. She doesn't eat much on her tray but has been tolerating regular snacks well. There is no persistent dysphagia and main complaint is a sensation of irritation from her Cortrak. Discussed with MD who will plan to remove today. [name] will also be capped as pt is tolerating PMSV all waking hours and is likely sleeping with PMSV in place despite instructions to remove. Pt has met all SLP goals and has not further needs at this time. Will sign off.   HPI HPI: Molly [name] 33 year old female who presented to the emergency department with altered mental status.  Patient was admitted to the hospital with working diagnosis of sepsis with multiorgan failure and possible DIC.  Patient condition deteriorated and required invasive mechanical ventilation 4/4.  CT of the chest was done which was positive for septic emboli and right base pneumonia.  Blood cultures grew MSSA.  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  Patient has history of polysubstance/IV drug abuse.  Patient has been on antibiotic therapy with cefazolin.  Underwent tracheostomy on 7/62, which was complicated by bleeding. Patient;s trach was downsized to a #4 cuffless on 5/13.       SLP Plan  All goals met       Recommendations  Diet recommendations: Regular;Thin liquid Liquids provided via: Cup;Straw Medication Administration: Whole meds with liquid Supervision: Patient able to self feed                Plan: All goals met       GO               Herbie Baltimore, MA CCC-SLP  Acute Rehabilitation Services Pager [phone] Office [phone]  Lynann Beaver 03/05/2019, 10:32 AM

## 2019-03-05 NOTE — Consult Note (Signed)
[facility] Nurse wound consult note Reason for Consult: pressure injury Wound type: Unstageable pressure injury lumbar spine Pressure Injury POA: Yes Measurement: 0.5cm x 0.5cm x 0.1cm  Wound bed: 100% yellow Drainage (amount, consistency, odor)minimal, no odor Periwound:  Intact, frail  Dressing procedure/placement/frequency: Enzymatic debridement to the spinal wound, change daily.   Patient turns, will not implement specialty bed.   Discussed POC with patient and bedside nurse.  Re consult if needed, will not follow at this time. Thanks  Connie Lasater [employer], RN,CWOCN, CNS, [place] [phone])

## 2019-03-05 NOTE — Progress Notes (Signed)
PROGRESS NOTE  Molly [name] AST:[id number] DOB: 1986/01/15 DOA: 01/20/2019 PCP: Patient, No Pcp Per   LOS: 44 days   Patient is from: Home  Brief Narrative / Interim history: 33 y/o F with a history of IV drug abuse/polysubstance abuse (presented with urine drug screen positive for opiates and amphetamines) and a previous history in June or July 2019 of inadequately treated MRSA endocarditis having signed out AMA at that time. She had a tricuspid vegetation at that time. Admitted 4/4 hypotensive and in respiratory distress requiring intubation at [facility] long. Admitted for acute respiratory failure/septic shock/ AKI due to septic emboli with MSSA bacteremia.  Significant events 4/04 Admit with hypotension / resp distress, intubated  4/05 Family discussion >concern terminal based on sepsis, AKI, septic emboli pancytopenia  4/10 UOP improving, off pressors, low grade fevers  4/11 Agitated, PRN versed. Awake / FC. Looks best in many days. Failed SBT 4/12 Failed SBT. AKI improving, tx PRBC. Less alert on precedex, methadone  4/13 More alert, FC. On precedex. Failed SBT. ID recommending PICC for antibiotcs >mother not sure about goals of care. S/p 1 unit PRBC   4/17 Trach placement 4/21 tolerating trach collar trials. 4/23 cdiff result positive overnight with continued diarrhea 4/26 seizure 4/28 status epilepticus  5/7- Tolerating trach collar  5/8- Trach replaced- 6 cuffed  5/12- 2 units PRBCs, trach changed to 4 cuff less 5/14-TRH assumed care.  Subjective: No major events overnight of this morning.  She is fixating on going home.  Again emphasized importance of staying on finishing antibiotic course.  He is finally agreeable to this.  She is also interested in Suboxone.  Assessment & Plan: MSSA bacteremia with tricuspid valve endocarditis and septic pulmonary emboli in patient with history of IVDU E. coli pneumonia -Appreciate infectious disease input -Cefepime and linezolid  5/6-5/13, and ancef through 03/06/2019 per ID -CRP and ESR elevated-discussed with Dr. Megan Salon from [place] on 03/04/2019, and no more Antibiotics after 5/19.  Respiratory failure with hypoxia status post trach -Trach management per PCCM-plan for decannulation in the next 48 hours  Severe protein calorie malnutrition/dysphagia -Tolerating some oral intake. -Appreciate nutrition and SLP input -Continue multivitamin -Discontinue Cortrak  History of IVDU/back pain: Both patient and mother are interested in Suboxone. -Discussed with Dr. Daryll Drown from IM who will kindly see the patient the morning of 5/19. -Discontinue fentanyl patch -PRN oxycodone for moderate to severe pain.  Seizure activity/status epilepticus/PRES -Seizure-like activity on 4/26 and status epilepticus on 4/28 -No further seizures -Keppra per neurology -Dilantin discontinued 5/7 -Outpatient neurology follow-up  Essential hypertension/tachycardia: Blood pressure fairly controlled.  Heart rate 100-110s. -Change metoprolol to 50 mg ER  AKI: Serum creatinine 7.12 on admission.  Unknown baseline.  Likely due to septic shock. -IV normal saline at 60 cc/h -Follow BMP  Unstageable pressure skin injury: over his mid back. Not able to determine if present on admission.  Improving. -No fluctuance or signs of infection on exam. -We will continue dressing to offload pressure -Consult wound care-appreciate input. -Follow CT lumbar spine Hypokalemia:  -Replenish.  Recheck.  Hypomagnesemia: Resolved  C. difficile colitis -Completed treatment  Anemia of chronic disease/positive Hemoccult: Hemoglobin 5.8 on 5/12 partly dilutional.  Hemoccult positive.  Received 2 units on 5/12 with appropriate response. - H&H stable. -We will continue monitoring -GI consult if further drop.  Thrombocytopenia: Resolved  Anxiety -Continue Klonopin and PRN  Nausea and emesis -PRN Zofran -DC cortrack  Scheduled Meds: . acetaminophen  650 mg  Oral Q6H  .  Chlorhexidine Gluconate Cloth  6 each Topical Daily  . collagenase   Topical Daily  . feeding supplement  1 Container Oral TID WC  . feeding supplement (ENSURE ENLIVE)  237 mL Oral BID BM  . levETIRAcetam  1,000 mg Oral Q12H  . metoprolol succinate  50 mg Oral Daily  . multivitamin with minerals  1 tablet Oral Daily  . pantoprazole  40 mg Oral Daily  . sodium chloride flush  10-40 mL Intracatheter Q12H  . vitamin C  250 mg Oral BID   Continuous Infusions: . sodium chloride Stopped (02/28/19 1732)  . sodium chloride 60 mL/hr at 03/05/19 0400  .  ceFAZolin (ANCEF) IV 2 g (03/05/19 1339)   PRN Meds:.sodium chloride, clonazepam, guaiFENesin, labetalol, LORazepam, [DISCONTINUED] ondansetron **OR** ondansetron (ZOFRAN) IV, oxyCODONE, Resource ThickenUp Clear, sodium chloride, sodium chloride flush   DVT prophylaxis: SCD Code Status: Partial Family Communication: Attempted to call patient's mother for update but no answer.  Did not leave voicemail. Disposition Plan: Remains inpatient for IV antibiotics and trach collar management.  Final disposition likely home the next 48 hours.  Patient refuses CIR.   Consultants:   PCCM  ID  Neurology  Palliative  Nephrology  Procedures:  ETT 4/4 >> 4/17 R Femoral line 4/4 >> 4/8 R IJ Tunneled CVC (IR) 4/21 >> Trach 4/17 >>  Microbiology: . Blood culture MSSA on 4/4 . Urine culture MSSA on 4/4 . Blood culture on 4/8-negative . Respiratory culture on 5/4 with multidrug-resistant E. coli  Antimicrobials: Anti-infectives (From admission, onward)   Start     Dose/Rate Route Frequency Ordered Stop   03/01/19 1000  ceFAZolin (ANCEF) IVPB 2g/100 mL premix     2 g 200 mL/hr over 30 Minutes Intravenous Every 8 hours 02/28/19 1453 03/06/19 2359   02/27/19 1000  linezolid (ZYVOX) tablet 600 mg     600 mg Oral Every 12 hours 02/27/19 0940 02/28/19 2258   02/22/19 1015  linezolid (ZYVOX) tablet 600 mg  Status:  Discontinued      600 mg Per Tube Every 12 hours 02/22/19 1012 02/27/19 0940   02/22/19 1000  meropenem (MERREM) 2 g in sodium chloride 0.9 % 100 mL IVPB  Status:  Discontinued     2 g 200 mL/hr over 30 Minutes Intravenous Every 12 hours 02/22/19 0853 02/22/19 0934   02/22/19 1000  ceFEPIme (MAXIPIME) 2 g in sodium chloride 0.9 % 100 mL IVPB     2 g 200 mL/hr over 30 Minutes Intravenous Every 12 hours 02/22/19 0935 03/01/19 0959   02/22/19 1000  linezolid (ZYVOX) 100 MG/5ML suspension 600 mg  Status:  Discontinued     600 mg Per Tube Every 12 hours 02/22/19 0938 02/22/19 1008   02/20/19 2200  ceFEPIme (MAXIPIME) 2 g in sodium chloride 0.9 % 100 mL IVPB  Status:  Discontinued     2 g 200 mL/hr over 30 Minutes Intravenous Every 12 hours 02/20/19 1444 02/22/19 0848   02/09/19 1400  ceFAZolin (ANCEF) IVPB 2g/100 mL premix  Status:  Discontinued     2 g 200 mL/hr over 30 Minutes Intravenous Every 8 hours 02/09/19 0902 02/20/19 1444   02/08/19 1000  vancomycin (VANCOCIN) 50 mg/mL oral solution 125 mg  Status:  Discontinued     125 mg Per Tube 4 times daily 02/08/19 0921 02/20/19 0823   02/06/19 1200  fluconazole (DIFLUCAN) tablet 100 mg     100 mg Oral Daily 02/06/19 1146 02/08/19 0956   01/29/19 1000  ceFAZolin (ANCEF) IVPB 2g/100 mL premix  Status:  Discontinued     2 g 200 mL/hr over 30 Minutes Intravenous Every 12 hours 01/28/19 2145 02/09/19 0902   01/28/19 2200  ceFAZolin (ANCEF) IVPB 1 g/50 mL premix     1 g 100 mL/hr over 30 Minutes Intravenous  Once 01/28/19 2145 01/28/19 2248   01/28/19 2130  ceFAZolin (ANCEF) IVPB 2g/100 mL premix  Status:  Discontinued     2 g 200 mL/hr over 30 Minutes Intravenous Every 12 hours 01/28/19 2121 01/28/19 2145   01/20/19 2000  ceFAZolin (ANCEF) IVPB 1 g/50 mL premix  Status:  Discontinued     1 g 100 mL/hr over 30 Minutes Intravenous Every 24 hours 01/20/19 1831 01/28/19 2121   01/20/19 1400  piperacillin-tazobactam (ZOSYN) IVPB 2.25 g  Status:  Discontinued    Note  to Pharmacy:  Pharmacy to dose   2.25 g 66.7 mL/hr over 30 Minutes Intravenous Every 8 hours 01/20/19 1234 01/20/19 1239   01/20/19 1400  piperacillin-tazobactam (ZOSYN) IVPB 2.25 g  Status:  Discontinued     2.25 g 100 mL/hr over 30 Minutes Intravenous Every 8 hours 01/20/19 1239 01/20/19 1831   01/20/19 0545  ceftaroline (TEFLARO) injection SOLR 300 mg  Status:  Discontinued     300 mg Intravenous  Once 01/20/19 0534 01/20/19 0535   01/20/19 0545  ceftaroline (TEFLARO) 300 mg in sodium chloride 0.9 % 250 mL IVPB     300 mg 250 mL/hr over 60 Minutes Intravenous  Once 01/20/19 0539 01/20/19 0727   01/20/19 0430  vancomycin (VANCOCIN) IVPB 1000 mg/200 mL premix  Status:  Discontinued     1,000 mg 200 mL/hr over 60 Minutes Intravenous  Once 01/20/19 0348 01/20/19 0519   01/20/19 0400  piperacillin-tazobactam (ZOSYN) IVPB 3.375 g     3.375 g 100 mL/hr over 30 Minutes Intravenous  Once 01/20/19 0348 01/20/19 0423      Objective: Vitals:   03/04/19 2325 03/05/19 0816 03/05/19 1027 03/05/19 1253  BP:   (!) 140/112   Pulse: (!) 108 (!) 112 (!) 114   Resp: (!) 26 (!) 22 18   Temp:    98.5 F (36.9 C)  TempSrc:    Oral  SpO2: 98% 90% 100%   Weight:      Height:        Intake/Output Summary (Last 24 hours) at 03/05/2019 1358 Last data filed at 03/05/2019 0000 Gross per 24 hour  Intake 1110 ml  Output 900 ml  Net 210 ml   Filed Weights   02/25/19 0343 02/26/19 0500 02/28/19 0500  Weight: 54 kg 54.3 kg 55 kg    Examination:  GENERAL: No acute distress.  HEENT: MMM.  Vision and hearing grossly intact.  NECK: Supple.  No apparent JVD LUNGS:  No IWOB. Good air movement bilaterally. HEART: Tachycardic to 110s.  Regular rhythm. Heart sounds normal.  ABD: Bowel sounds present. Soft. Non tender.  MSK/EXT:  Moves all extremities. No apparent deformity. No edema bilaterally.  SKIN: Unstageable pressure ulcer over his buttock.  See picture below. NEURO: Awake, alert and oriented  appropriately.  No gross deficit.   On 03/02/19   On 03/04/19    Data Reviewed: I have independently reviewed following labs and imaging studies  CBC: Recent Labs  Lab 02/27/19 0330 02/28/19 0517 03/01/19 0419 03/02/19 0800 03/03/19 0820 03/04/19 0745 03/05/19 0740  WBC 4.4 6.8  --   --   --   --   --  HGB 5.8* 8.8* 8.3* 9.4* 8.7* 8.4* 8.9*  HCT 18.8* 27.6* 26.1* 29.0* 26.8* 26.2* 27.4*  MCV 92.2 93.6  --   --   --   --   --   PLT 141* 159  --   --   --   --   --    Basic Metabolic Panel: Recent Labs  Lab 02/27/19 0330 02/28/19 0907 03/02/19 0800 03/03/19 0435 03/04/19 0745 03/05/19 0740  NA 143 137 137  --  134*  --   K 4.4 4.7 3.8  --  2.9*  --   CL 115* 111 108  --  108  --   CO2 17* 17* 21*  --  18*  --   GLUCOSE 101* 110* 98  --  101*  --   BUN 54* 45* 30*  --  25*  --   CREATININE 1.11* 1.25* 1.21*  --  1.36*  --   CALCIUM 7.8* 7.9* 7.8*  --  7.7*  --   MG 1.5* 1.7 1.4* 2.0 1.7 2.1   GFR: Estimated Creatinine Clearance: 45.2 mL/min (A) (by C-G formula based on SCr of 1.36 mg/dL (H)). Liver Function Tests: No results for input(s): AST, ALT, ALKPHOS, BILITOT, PROT, ALBUMIN in the last 168 hours. No results for input(s): LIPASE, AMYLASE in the last 168 hours. No results for input(s): AMMONIA in the last 168 hours. Coagulation Profile: No results for input(s): INR, PROTIME in the last 168 hours. Cardiac Enzymes: No results for input(s): CKTOTAL, CKMB, CKMBINDEX, TROPONINI in the last 168 hours. BNP (last 3 results) No results for input(s): PROBNP in the last 8760 hours. HbA1C: No results for input(s): HGBA1C in the last 72 hours. CBG: Recent Labs  Lab 02/27/19 1726 02/28/19 0033 02/28/19 0743 02/28/19 1532 03/01/19 0120  GLUCAP 92 108* 94 89 99   Lipid Profile: No results for input(s): CHOL, HDL, LDLCALC, TRIG, CHOLHDL, LDLDIRECT in the last 72 hours. Thyroid Function Tests: No results for input(s): TSH, T4TOTAL, FREET4, T3FREE, THYROIDAB in  the last 72 hours. Anemia Panel: No results for input(s): VITAMINB12, FOLATE, FERRITIN, TIBC, IRON, RETICCTPCT in the last 72 hours. Urine analysis:    Component Value Date/Time   COLORURINE AMBER (A) 01/20/2019 0252   APPEARANCEUR CLOUDY (A) 01/20/2019 0252   LABSPEC 1.020 01/20/2019 0252   PHURINE 5.0 01/20/2019 0252   GLUCOSEU NEGATIVE 01/20/2019 0252   HGBUR LARGE (A) 01/20/2019 0252   BILIRUBINUR NEGATIVE 01/20/2019 0252   KETONESUR NEGATIVE 01/20/2019 0252   PROTEINUR 100 (A) 01/20/2019 0252   NITRITE NEGATIVE 01/20/2019 0252   LEUKOCYTESUR TRACE (A) 01/20/2019 0252   Sepsis Labs: Invalid input(s): PROCALCITONIN, LACTICIDVEN  No results found for this or any previous visit (from the past 240 hour(s)).    Radiology Studies: No results found.   T. [facility] Pager [phone]  If 7PM-7AM, please contact night-coverage [email or web address] Password [email or web address] 03/05/2019, 1:58 PM

## 2019-03-06 ENCOUNTER — Inpatient Hospital Stay (HOSPITAL_COMMUNITY): Payer: Medicaid Other

## 2019-03-06 ENCOUNTER — Encounter (HOSPITAL_COMMUNITY): Payer: Self-pay | Admitting: Student

## 2019-03-06 HISTORY — PX: IR REMOVAL TUN CV CATH W/O FL: IMG2289

## 2019-03-06 LAB — BASIC METABOLIC PANEL
Anion gap: 10 (ref 5–15)
BUN: 22 mg/dL — ABNORMAL HIGH (ref 6–20)
CO2: 17 mmol/L — ABNORMAL LOW (ref 22–32)
Calcium: 8 mg/dL — ABNORMAL LOW (ref 8.9–10.3)
Chloride: 110 mmol/L (ref 98–111)
Creatinine, Ser: 1.48 mg/dL — ABNORMAL HIGH (ref 0.44–1.00)
GFR calc Af Amer: 54 mL/min — ABNORMAL LOW (ref >60)
GFR calc non Af Amer: 47 mL/min — ABNORMAL LOW (ref >60)
Glucose, Bld: 103 mg/dL — ABNORMAL HIGH (ref 70–99)
Potassium: 4.3 mmol/L (ref 3.5–5.1)
Sodium: 137 mmol/L (ref 135–145)

## 2019-03-06 LAB — MAGNESIUM: Magnesium: 1.8 mg/dL (ref 1.7–2.4)

## 2019-03-06 LAB — HEMOGLOBIN AND HEMATOCRIT, BLOOD
HCT: 27.4 % — ABNORMAL LOW (ref 36.0–46.0)
Hemoglobin: 8.9 g/dL — ABNORMAL LOW (ref 12.0–15.0)

## 2019-03-06 MED ORDER — BUPRENORPHINE HCL-NALOXONE HCL 2-0.5 MG SL SUBL: 2.0000 | SUBLINGUAL_TABLET | Freq: Two times a day (BID) | SUBLINGUAL | Status: DC

## 2019-03-06 MED ORDER — BUPRENORPHINE HCL-NALOXONE HCL 2-0.5 MG SL SUBL: 2.0000 | SUBLINGUAL_TABLET | Freq: Two times a day (BID) | SUBLINGUAL | 0 refills | Status: AC

## 2019-03-06 MED ORDER — ADULT MULTIVITAMIN W/MINERALS CH: 1.0000 | ORAL_TABLET | Freq: Every day | ORAL | 0 refills | Status: DC

## 2019-03-06 MED ORDER — PANTOPRAZOLE SODIUM 40 MG PO TBEC: 40.0000 mg | DELAYED_RELEASE_TABLET | Freq: Every day | ORAL | 0 refills | Status: DC

## 2019-03-06 MED ORDER — CLONAZEPAM 0.5 MG PO TABS: 0.5000 mg | ORAL_TABLET | Freq: Two times a day (BID) | ORAL | 0 refills | Status: AC | PRN

## 2019-03-06 MED ORDER — ASCORBIC ACID 250 MG PO TABS: 250.0000 mg | ORAL_TABLET | Freq: Two times a day (BID) | ORAL | 0 refills | Status: DC

## 2019-03-06 MED ORDER — LEVETIRACETAM ER 500 MG PO TB24: 500.0000 mg | ORAL_TABLET | Freq: Two times a day (BID) | ORAL | 0 refills | Status: DC

## 2019-03-06 MED ORDER — METOPROLOL SUCCINATE ER 50 MG PO TB24: 50.0000 mg | ORAL_TABLET | Freq: Every day | ORAL | 0 refills | Status: DC

## 2019-03-06 MED ORDER — ENSURE ENLIVE PO LIQD: 237.0000 mL | Freq: Two times a day (BID) | ORAL | 0 refills | Status: DC

## 2019-03-06 NOTE — Plan of Care (Signed)
  Problem: Health Behavior/Discharge Planning: Goal: Ability to manage health-related needs will improve Outcome: Progressing   Problem: Clinical Measurements: Goal: Ability to maintain clinical measurements within normal limits will improve Outcome: Progressing Goal: Will remain free from infection Outcome: Progressing Goal: Diagnostic test results will improve Outcome: Progressing Goal: Respiratory complications will improve Outcome: Progressing   Problem: Activity: Goal: Risk for activity intolerance will decrease Outcome: Progressing   Problem: Nutrition: Goal: Adequate nutrition will be maintained Outcome: Progressing   Problem: Pain Managment: Goal: General experience of comfort will improve Outcome: Progressing   Problem: Skin Integrity: Goal: Risk for impaired skin integrity will decrease Outcome: Progressing   Problem: Activity: Goal: Ability to tolerate increased activity will improve Outcome: Progressing   Problem: Respiratory: Goal: Patent airway maintenance will improve Outcome: Progressing

## 2019-03-06 NOTE — Procedures (Signed)
PROCEDURE SUMMARY:  Successful removal of tunneled right IJ CVC. No immediate complications.  EBL = 0 mL. Patient tolerated well.  Pressure dressing applied to site.  Earley Abide PA-C 03/06/2019 3:42 PM

## 2019-03-06 NOTE — Progress Notes (Signed)
Arrived to room to flush+cap central line. Pt reported to her RN she plans to leave AMA. Informed RN pt has tunneled cath, those are not removed by nursing.

## 2019-03-06 NOTE — Progress Notes (Signed)
Respiratory Note: Shiley #4 CFS trach tube removed by Marni Griffon at approximately 1657 without complications.  Patient on Room Air SaO2 96-98%

## 2019-03-06 NOTE — Progress Notes (Signed)
NAME:  Molly [name], MRN:  [id number], DOB:  1986-01-31, LOS: 80 ADMISSION DATE:  01/20/2019, CONSULTATION DATE: 01/20/2019 REFERRING MD:  Molly [name], CHIEF COMPLAINT:  Sepsis  Brief History   33 y/o F with a history of IV drug abuse/polysubstance abuse (presented with urine drug screen positive for opiates and amphetamines) and a previous history in June or July 2019 of inadequately treated MRSA endocarditis having signed out AMA at that time.  She had a tricuspid vegetation at that time.  Admitted 4/4 hypotensive and in respiratory distress requiring intubation at [facility] long.   Admitted for acute respiratory failure/septic shock/ AKI due to septic emboli with MSSA bacteremia.  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 Awake / FC. Looks best in many days. Failed SBT 4/12 Failed SBT.  AKI improving, tx PRBC.  Less alert on precedex, methadone  4/13 More alert, FC. On precedex. Failed SBT. ID recommending PICC for antibiotcs > mother not sure about goals of care. S/p 1 unit PRBC   4/17 Trach placement- 6 cuffed 4/21 Tolerating trach collar trials. 4/23 cdiff result positive overnight with continued diarrhea 4/26 seizure 4/28 status epilepticus 5/7- Tolerating trach collar 5/8- Trach replaced- 6 cuffed 5/12- 2 units PRBCs, trach changed to 4 cuff less 5/19 decannulated  Consults:  Nephrology Neurology  Procedures:  ETT 4/4 >> 4/17 R Femoral line 4/4 >> 4/8 R IJ Tunneled CVC (IR) 4/20 >>  Trach 4/17  >>5/19  Significant Diagnostic Tests:  Echocardiogram 4/4 >> TV vegetation measures 1.5 mm x 0.6 mm., severe TR CT Chest 4/4 >> bilateral pulmonary nodules, peripheral and cavitated.  LE venous duplex 4/20 >> negative MRI brain 4/26 >> scattered T2/FLAIR most in parieto-occipital region b/l EEG 4/27 >> no evidence of seizures  Micro Data:  Blood cultures x2 4/4 >> MSSA  BCx2 4/8 >> negative   C diff 4/22 >> antigen positive, toxin negative, PCR positive 4/28 urine cx: neg 5/4 trach cx: ecoli, VRE  Antimicrobials:  Vancomycin Zosyn Teflaro >> stopped Ancef 4/4 > 5/5 Diflucan 4/21 >> 4/24 Vancomycin po 4/23 >> 5/5   Cefepime 5/5>> 5/13 Linezolid 5/7 >> 5/13 Ancef 5/13 >>   Interim history/subjective:  No issues w/ trach capping   Objective   Blood pressure (Abnormal) 137/116, pulse (Abnormal) 102, temperature 98.1 F (36.7 C), temperature source Oral, resp. rate 17, height 5\' 1"  (1.549 m), weight 55 kg, SpO2 99 %.    FiO2 (%):  [21 %] 21 %   Intake/Output Summary (Last 24 hours) at 03/06/2019 1340 Last data filed at 03/06/2019 0338 Gross per 24 hour  Intake 580 ml  Output 700 ml  Net -120 ml   Filed Weights   02/25/19 0343 02/26/19 0500 02/28/19 0500  Weight: 54 kg 54.3 kg 55 kg   Exam:  General: Is 33 year old white female she is resting in bed and in no acute distress.  She is tolerating capping trials without difficulty HEENT normocephalic atraumatic phonation is excellent with capped trach Pulmonary: Clear to auscultation Cardiac: Regular rate and rhythm Abdomen: Soft  nontender Extremities: Warm dry no edema strong pulses brisk capillary refill Neuro intact  Resolved Hospital Problem list   Septic Shock, thrush  Active Problem list   Acute resp failure with hypoxia s/p trach - tolerating ATC without issues Septic pulmonary emboi Severe protein calorie malnutrition Dysphagia New onset seizure 4/26 Status epilepticus 4/28 MSSA bacteremia with tricuspid  valve endocarditis E coli and VRE PNA C. 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   Discussion  Molly [name] has done excellent with PMV V trials and now has passed capping trials without difficulty for over 24 hours.  Her phonation quality is strong she has a strong cough and she is now ready for decannulation which I have completed at the bedside following decannulation her speech is strong she is having no difficulty.  I have discussed post tracheostomy care with the patient, and will include post tracheostomy discharge instructions. Plan Keep current tracheostomy stoma covered for 48 hours then can change daily every 24 hours with regular Band-Aid until stoma closed Encouraged her to splint stoma site with finger when coughing Instructed not to submerge underwater until stoma completely closed Will include respiratory therapy department contact number, she has been instructed that should tracheostomy stoma remain open after 2 weeks I will need to see her in the tracheostomy clinic at which time I will refer her to ENT  Molly [name] ACNP-BC [place] Pager # [phone] OR # [phone] if no answer

## 2019-03-06 NOTE — Progress Notes (Signed)
Late entry for 03/05/19 During shift change call came out for a nurse to come to patient's room. When entering room with Cyril Mourning the night charge nurse two nurse techs were in room already with patient and stated that Ms. Schader said she wanted to leave and had legs hanging out of the bed. Nurse techs were trying to get her safely back into the bed. She did finally pull her legs back up into the bed.  Patient had no gown on and I asked her to cover herself so that we could talk to her.  I asked her how much longer she was going to be here and why not complete her treatment before leaving.  I talked her into to staying and waiting to see the doctor this morning.  She had pulled off all of the monitor leads and I was reaching down into the bed to pick up the leads when the patient hit my right forearm.  I told her that she was not to hit me or anyone else.  She said "Well you were going to touch me!" I said no I was reaching for the wires in the bed.  She then stated, "Get the "fuck" out of my room!".

## 2019-03-06 NOTE — Progress Notes (Signed)
Pt refused to let nurse hang last dose of iv abx prior to getting her IJ removed in IR. Explained to pt the importance of taking the medication. Pt still refused and requested to leave. Provider aware that pt wanting to leave and stated "it's okay to remove pt's IJ". Will continue to monitor pt.

## 2019-03-06 NOTE — Progress Notes (Signed)
PROGRESS NOTE  Molly [name] PJK:[id number] DOB: [birth year] DOA: 01/20/2019 PCP: Patient, No Pcp Per   LOS: 45 days   Patient is from: Home  Brief Narrative / Interim history: 33 y/o F with a history of IV drug abuse/polysubstance abuse (presented with urine drug screen positive for opiates and amphetamines) and a previous history in June or July 2019 of inadequately treated MRSA endocarditis having signed out AMA at that time. She had a tricuspid vegetation at that time. Admitted 4/4 hypotensive and in respiratory distress requiring intubation at [facility] long. Admitted for acute respiratory failure/septic shock/ AKI due to septic emboli with MSSA bacteremia.  Significant events 4/04 Admit with hypotension / resp distress, intubated  4/05 Family discussion >concern terminal based on sepsis, AKI, septic emboli pancytopenia  4/10 UOP improving, off pressors, low grade fevers  4/11 Agitated, PRN versed. Awake / FC. Looks best in many days. Failed SBT 4/12 Failed SBT. AKI improving, tx PRBC. Less alert on precedex, methadone  4/13 More alert, FC. On precedex. Failed SBT. ID recommending PICC for antibiotcs >mother not sure about goals of care. S/p 1 unit PRBC   4/17 Trach placement 4/21 tolerating trach collar trials. 4/23 cdiff result positive overnight with continued diarrhea 4/26 seizure 4/28 status epilepticus  5/7- Tolerating trach collar  5/8- Trach replaced- 6 cuffed  5/12- 2 units PRBCs, trach changed to 4 cuff less 5/14-TR[facility] assumed care.  Subjective: Reportedly inappropriate with nurses.  Again she is fixated on leaving today.  I explained to her why she should stay tonight.  I told her CCM is planning to decannulate her trach tomorrow so she won't  be going home with trach.   Assessment & Plan: MSSA bacteremia with tricuspid valve endocarditis and septic pulmonary emboli in patient with history of IVDU E. coli pneumonia -Appreciate infectious disease input -Cefepime  and linezolid 5/6-5/13, and ancef through 03/06/2019 per ID -CRP and ESR elevated-discussed with Dr. Megan Salon from [facility] on 03/04/2019, and no more Antibiotics after 5/19.  Respiratory failure with hypoxia status post trach -Trach management per PCCM-plan for decannulation on 5/20.  Severe protein calorie malnutrition/dysphagia -Cortrak discontinued on 5/18 -Tolerating oral. -Appreciate nutrition and SLP input-continue multivitamins and supplements. -May benefit from outpatient nutrition follow-up  History of IVDU/back pain: Both patient and mother are interested in Suboxone. -Dr. Daryll Drown from IM kindly saw her on 5/19 and started on Suboxone.  She will send a prescription to her pharmacy -Patient will follow-up with Dr. Daryll Drown outpatient. -Oxycodone discontinued.  Anxiety: has been on Klonopin and PRN Ativan here -I have sent Rx for Klonopin to her pharmacy  Seizure activity/status epilepticus/PRES -Seizure-like activity on 4/26 and status epilepticus on 4/28-no further seizures. -Keppra per neurology -Dilantin discontinued 5/7 -Outpatient neurology follow-up  Essential hypertension/tachycardia: Blood pressure fairly controlled.  Heart rate 100-110s.  I think this is driven by anxiety and anemia. -Kidney metoprolol ER 50 mg daily  AKI: Serum creatinine 7.12 on admission.  Unknown baseline.  Likely due to septic shock.  Serum creatinine slightly up trending. -Continue monitoring.  Unstageable pressure skin injury: over her mid back. Not able to determine if present on admission.  Improving. -No fluctuance or signs of infection on exam.  -CT lumbar spine 5/17 with endplate irregularity above and below T12-L1 consistent with healed discitis. -We will continue dressing to offload pressure -Consult wound care-appreciate input.   Hypokalemia: Resolved. -Continue monitoring  Hypomagnesemia: Resolved  C. difficile colitis -Completed treatment  Anemia of chronic disease/positive  Hemoccult: Hemoglobin 5.8 on  5/12 partly dilutional.  Hemoccult positive.  Received 2 units on 5/12 with appropriate response. - H&H stable. -GI consult if further drop.  Thrombocytopenia: Resolved  Nausea and emesis -PRN Zofran  Scheduled Meds:  acetaminophen  650 mg Oral Q6H   [START ON 03/07/2019] buprenorphine-naloxone  2 tablet Sublingual BID   Chlorhexidine Gluconate Cloth  6 each Topical Daily   collagenase   Topical Daily   feeding supplement  1 Container Oral TID WC   feeding supplement (ENSURE ENLIVE)  237 mL Oral BID BM   levETIRAcetam  1,000 mg Oral Q12H   metoprolol succinate  50 mg Oral Daily   multivitamin with minerals  1 tablet Oral Daily   pantoprazole  40 mg Oral Daily   sodium chloride flush  10-40 mL Intracatheter Q12H   vitamin C  250 mg Oral BID   Continuous Infusions:  sodium chloride Stopped (02/28/19 1732)    ceFAZolin (ANCEF) IV 2 g (03/06/19 0629)   PRN Meds:.sodium chloride, clonazepam, guaiFENesin, labetalol, LORazepam, [DISCONTINUED] ondansetron **OR** ondansetron (ZOFRAN) IV, Resource ThickenUp Clear, sodium chloride, sodium chloride flush   DVT prophylaxis: SCD Code Status: Partial Family Communication: Updated patient's mother over the phone on 5/19. Disposition Plan: Remains inpatient for IV antibiotics and trach collar management.  Plan for decannulation on 5/20 and possible discharge with [facility]  Consultants:   PCCM  ID  Neurology  Palliative  Nephrology  IM  Procedures:  ETT 4/4 >> 4/17 R Femoral line 4/4 >> 4/8 R IJ Tunneled CVC (IR) 4/21 >> Trach 4/17 >>  Microbiology:  Blood culture MSSA on 4/4  Urine culture MSSA on 4/4  Blood culture on 4/8-negative  Respiratory culture on 5/4 with multidrug-resistant E. coli  Antimicrobials: Anti-infectives (From admission, onward)   Start     Dose/Rate Route Frequency Ordered Stop   03/01/19 1000  ceFAZolin (ANCEF) IVPB 2g/100 mL premix     2 g 200 mL/hr over  30 Minutes Intravenous Every 8 hours 02/28/19 1453 03/06/19 2359   02/27/19 1000  linezolid (ZYVOX) tablet 600 mg     600 mg Oral Every 12 hours 02/27/19 0940 02/28/19 2258   02/22/19 1015  linezolid (ZYVOX) tablet 600 mg  Status:  Discontinued     600 mg Per Tube Every 12 hours 02/22/19 1012 02/27/19 0940   02/22/19 1000  meropenem (MERREM) 2 g in sodium chloride 0.9 % 100 mL IVPB  Status:  Discontinued     2 g 200 mL/hr over 30 Minutes Intravenous Every 12 hours 02/22/19 0853 02/22/19 0934   02/22/19 1000  ceFEPIme (MAXIPIME) 2 g in sodium chloride 0.9 % 100 mL IVPB     2 g 200 mL/hr over 30 Minutes Intravenous Every 12 hours 02/22/19 0935 03/01/19 0959   02/22/19 1000  linezolid (ZYVOX) 100 MG/5ML suspension 600 mg  Status:  Discontinued     600 mg Per Tube Every 12 hours 02/22/19 0938 02/22/19 1008   02/20/19 2200  ceFEPIme (MAXIPIME) 2 g in sodium chloride 0.9 % 100 mL IVPB  Status:  Discontinued     2 g 200 mL/hr over 30 Minutes Intravenous Every 12 hours 02/20/19 1444 02/22/19 0848   02/09/19 1400  ceFAZolin (ANCEF) IVPB 2g/100 mL premix  Status:  Discontinued     2 g 200 mL/hr over 30 Minutes Intravenous Every 8 hours 02/09/19 0902 02/20/19 1444   02/08/19 1000  vancomycin (VANCOCIN) 50 mg/mL oral solution 125 mg  Status:  Discontinued     125  mg Per Tube 4 times daily 02/08/19 0921 02/20/19 0823   02/06/19 1200  fluconazole (DIFLUCAN) tablet 100 mg     100 mg Oral Daily 02/06/19 1146 02/08/19 0956   01/29/19 1000  ceFAZolin (ANCEF) IVPB 2g/100 mL premix  Status:  Discontinued     2 g 200 mL/hr over 30 Minutes Intravenous Every 12 hours 01/28/19 2145 02/09/19 0902   01/28/19 2200  ceFAZolin (ANCEF) IVPB 1 g/50 mL premix     1 g 100 mL/hr over 30 Minutes Intravenous  Once 01/28/19 2145 01/28/19 2248   01/28/19 2130  ceFAZolin (ANCEF) IVPB 2g/100 mL premix  Status:  Discontinued     2 g 200 mL/hr over 30 Minutes Intravenous Every 12 hours 01/28/19 2121 01/28/19 2145   01/20/19  2000  ceFAZolin (ANCEF) IVPB 1 g/50 mL premix  Status:  Discontinued     1 g 100 mL/hr over 30 Minutes Intravenous Every 24 hours 01/20/19 1831 01/28/19 2121   01/20/19 1400  piperacillin-tazobactam (ZOSYN) IVPB 2.25 g  Status:  Discontinued    Note to Pharmacy:  Pharmacy to dose   2.25 g 66.7 mL/hr over 30 Minutes Intravenous Every 8 hours 01/20/19 1234 01/20/19 1239   01/20/19 1400  piperacillin-tazobactam (ZOSYN) IVPB 2.25 g  Status:  Discontinued     2.25 g 100 mL/hr over 30 Minutes Intravenous Every 8 hours 01/20/19 1239 01/20/19 1831   01/20/19 0545  ceftaroline (TEFLARO) injection SOLR 300 mg  Status:  Discontinued     300 mg Intravenous  Once 01/20/19 0534 01/20/19 0535   01/20/19 0545  ceftaroline (TEFLARO) 300 mg in sodium chloride 0.9 % 250 mL IVPB     300 mg 250 mL/hr over 60 Minutes Intravenous  Once 01/20/19 0539 01/20/19 0727   01/20/19 0430  vancomycin (VANCOCIN) IVPB 1000 mg/200 mL premix  Status:  Discontinued     1,000 mg 200 mL/hr over 60 Minutes Intravenous  Once 01/20/19 0348 01/20/19 0519   01/20/19 0400  piperacillin-tazobactam (ZOSYN) IVPB 3.375 g     3.375 g 100 mL/hr over 30 Minutes Intravenous  Once 01/20/19 0348 01/20/19 0423      Objective: Vitals:   03/06/19 0145 03/06/19 0409 03/06/19 0731 03/06/19 1130  BP:  (!) 137/116    Pulse:  (!) 106 (!) 102   Resp:  16 17   Temp: 98.1 F (36.7 C) 98.1 F (36.7 C)    TempSrc: Oral Oral    SpO2:   98% 99%  Weight:      Height:        Intake/Output Summary (Last 24 hours) at 03/06/2019 1240 Last data filed at 03/06/2019 0338 Gross per 24 hour  Intake 580 ml  Output 700 ml  Net -120 ml   Filed Weights   02/25/19 0343 02/26/19 0500 02/28/19 0500  Weight: 54 kg 54.3 kg 55 kg    Examination:  GENERAL: No acute distress.  HEENT: MMM.  Vision and hearing grossly intact.  NECK: Supple.  No apparent JVD LUNGS:  No IWOB. Good air movement bilaterally. HEART: Tachycardic to 110s.  Regular rhythm. Heart  sounds normal.  ABD: Bowel sounds present. Soft. Non tender.  MSK/EXT:  Moves all extremities. No apparent deformity. No edema bilaterally.  SKIN: Unstageable pressure ulcer over his buttock.  See picture below. NEURO: Awake, alert and oriented appropriately.  No gross deficit.   GENERAL: No acute distress.  Upset about not going home today. HEENT: MMM.  Vision and hearing grossly intact.  NECK: Supple.  No apparent JVD. LUNGS:  No IWOB. Good air movement bilaterally. HEART: HR 100-110.  Regular rhythm. Heart sounds normal.  1+ pitting edema bilaterally. ABD: Bowel sounds present. Soft. Non tender.  MSK/EXT:  Moves all extremities. No apparent deformity. No edema bilaterally.  SKIN: Unstageable pressure ulcer over her buttock.  No signs of active infection.  See picture below. NEURO: Awake, alert and oriented appropriately.  No gross deficit.  PSYCH: Upset.  Labile  On 03/02/19   On 03/04/19    Data Reviewed: I have independently reviewed following labs and imaging studies  CBC: Recent Labs  Lab 02/28/19 0517  03/02/19 0800 03/03/19 0820 03/04/19 0745 03/05/19 0740 03/06/19 0819  WBC 6.8  --   --   --   --   --   --   HGB 8.8*   < > 9.4* 8.7* 8.4* 8.9* 8.9*  HCT 27.6*   < > 29.0* 26.8* 26.2* 27.4* 27.4*  MCV 93.6  --   --   --   --   --   --   PLT 159  --   --   --   --   --   --    < > = values in this interval not displayed.   Basic Metabolic Panel: Recent Labs  Lab 02/28/19 0907 03/02/19 0800 03/03/19 0435 03/04/19 0745 03/05/19 0740 03/05/19 1844 03/06/19 0819  NA 137 137  --  134*  --  135 137  K 4.7 3.8  --  2.9*  --  3.5 4.3  CL 111 108  --  108  --  110 110  CO2 17* 21*  --  18*  --  16* 17*  GLUCOSE 110* 98  --  101*  --  158* 103*  BUN 45* 30*  --  25*  --  21* 22*  CREATININE 1.25* 1.21*  --  1.36*  --  1.41* 1.48*  CALCIUM 7.9* 7.8*  --  7.7*  --  7.7* 8.0*  MG 1.7 1.4* 2.0 1.7 2.1  --  1.8   GFR: Estimated Creatinine Clearance: 41.6 mL/min (A)  (by C-G formula based on SCr of 1.48 mg/dL (H)). Liver Function Tests: No results for input(s): AST, ALT, ALKPHOS, BILITOT, PROT, ALBUMIN in the last 168 hours. No results for input(s): LIPASE, AMYLASE in the last 168 hours. No results for input(s): AMMONIA in the last 168 hours. Coagulation Profile: No results for input(s): INR, PROTIME in the last 168 hours. Cardiac Enzymes: No results for input(s): CKTOTAL, CKMB, CKMBINDEX, TROPONINI in the last 168 hours. BNP (last 3 results) No results for input(s): PROBNP in the last 8760 hours. HbA1C: No results for input(s): HGBA1C in the last 72 hours. CBG: Recent Labs  Lab 02/27/19 1726 02/28/19 0033 02/28/19 0743 02/28/19 1532 03/01/19 0120  GLUCAP 92 108* 94 89 99   Lipid Profile: No results for input(s): CHOL, HDL, LDLCALC, TRIG, CHOLHDL, LDLDIRECT in the last 72 hours. Thyroid Function Tests: No results for input(s): TSH, T4TOTAL, FREET4, T3FREE, THYROIDAB in the last 72 hours. Anemia Panel: No results for input(s): VITAMINB12, FOLATE, FERRITIN, TIBC, IRON, RETICCTPCT in the last 72 hours. Urine analysis:    Component Value Date/Time   COLORURINE AMBER (A) 01/20/2019 0252   APPEARANCEUR CLOUDY (A) 01/20/2019 0252   LABSPEC 1.020 01/20/2019 0252   PHURINE 5.0 01/20/2019 0252   GLUCOSEU NEGATIVE 01/20/2019 0252   HGBUR LARGE (A) 01/20/2019 0252   BILIRUBINUR NEGATIVE 01/20/2019 0252   KETONESUR NEGATIVE  01/20/2019 0252   PROTEINUR 100 (A) 01/20/2019 0252   NITRITE NEGATIVE 01/20/2019 0252   LEUKOCYTESUR TRACE (A) 01/20/2019 0252   Sepsis Labs: Invalid input(s): PROCALCITONIN, LACTICIDVEN  No results found for this or any previous visit (from the past 240 hour(s)).    Radiology Studies: No results found.  Sherika Kubicki T. [facility] Pager [phone]  If 7PM-7AM, please contact night-coverage [email or web address] Password [email or web address] 03/06/2019, 12:40 PM

## 2019-03-06 NOTE — Progress Notes (Signed)
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 Pt intubated 01/20/19. Pt with multiple seizures 4/28-4/29. 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 She was quite agitated about discharging and getting trach removed and getting saline stopped because she feels that it is causing her to swell. Ambulation initiated and pt did well until she had to put mask on to go in hallway, after 10' she became very anxious, reported that she could not breathe with mask, and could not go any further. Returned to room and chair. . Follow    Follow Up Recommendations  CIR     Equipment Recommendations  None recommended by PT    Recommendations for Other Services       Precautions / Restrictions Precautions Precautions: Fall Precaution Comments: trach, cortrak Restrictions Weight Bearing Restrictions: No    Mobility  Bed Mobility Overal bed mobility: Needs Assistance Bed Mobility: Supine to Sit;Rolling Rolling: Min guard   Supine to sit: Min assist;HOB elevated     General bed mobility comments: heavy use of bed rail, pt with c/o back pain, min A for elevation of trunk to final sitting, increased time needed  Transfers Overall transfer level: Needs assistance Equipment used: Rolling walker (2 wheeled) Transfers: Sit to/from Stand Sit to Stand: Min guard         General transfer comment: min-guard for safety, RW used for support  Ambulation/Gait Ambulation/Gait assistance: Herbalist (Feet): 40 Feet Assistive device: Rolling walker (2 wheeled) Gait Pattern/deviations: Trunk flexed;Decreased step length -  right;Decreased step length - left;Narrow base of support;Step-through pattern;Decreased stride length Gait velocity: Decreased Gait velocity interpretation: <1.8 ft/sec, indicate of risk for recurrent falls General Gait Details: pt ambulated in room with min A, given face mask for out of room and went only short distance before she reported that she could not breathe with the mask and needed to get back in the room to take off. Pt quite anxious at that point.    Stairs             Wheelchair Mobility    Modified Rankin (Stroke Patients Only)       Balance Overall balance assessment: Needs assistance Sitting-balance support: Feet supported;Single extremity supported Sitting balance-Leahy Scale: Fair Sitting balance - Comments: maintainted sitting EOB x7 min   Standing balance support: Bilateral upper extremity supported Standing balance-Leahy Scale: Poor Standing balance comment: posterior LOB when she let go of RW to don mask, min A to correct                            Cognition Arousal/Alertness: Awake/alert Behavior During Therapy: Agitated Overall Cognitive Status: No family/caregiver present to determine baseline cognitive functioning Area of Impairment: Safety/judgement;Problem solving                       Following Commands: Follows one step commands consistently Safety/Judgement: Decreased awareness of safety Awareness: Intellectual Problem Solving: Requires verbal cues General Comments: emotionally labile today, escalates quickly when relaying her frustrations      Exercises General Exercises - Lower Extremity Ankle Circles/Pumps: AROM;Both;Seated;15  reps Long Arc Quad: AROM;Both;15 reps;Seated Hip Flexion/Marching: AROM;20 reps;Seated    General Comments General comments (skin integrity, edema, etc.): pt fatigued quickly with activity, went to chair after session to await breakfast tray which arrived as session ended       Pertinent Vitals/Pain Pain Assessment: Faces Faces Pain Scale: Hurts little more Pain Location: back Pain Descriptors / Indicators: Grimacing;Guarding Pain Intervention(s): Limited activity within patient's tolerance;Monitored during session    Home Living                      Prior Function            PT Goals (current goals can now be found in the care plan section) Acute Rehab PT Goals Patient Stated Goal: wants to go home PT Goal Formulation: With patient Time For Goal Achievement: 03/15/19 Potential to Achieve Goals: Fair Progress towards PT goals: Not progressing toward goals - comment(agitation and anxiety)    Frequency    Min 3X/week      PT Plan Current plan remains appropriate    Co-evaluation              AM-PAC PT "6 Clicks" Mobility   Outcome Measure  Help needed turning from your back to your side while in a flat bed without using bedrails?: None Help needed moving from lying on your back to sitting on the side of a flat bed without using bedrails?: A Little Help needed moving to and from a bed to a chair (including a wheelchair)?: A Little Help needed standing up from a chair using your arms (e.g., wheelchair or bedside chair)?: A Little Help needed to walk in hospital room?: A Little Help needed climbing 3-5 steps with a railing? : A Lot 6 Click Score: 18    End of Session Equipment Utilized During Treatment: Gait belt Activity Tolerance: Patient tolerated treatment well Patient left: in chair;with call bell/phone within reach;with chair alarm set Nurse Communication: Mobility status PT Visit Diagnosis: Other abnormalities of gait and mobility (R26.89);Muscle weakness (generalized) (M62.81);Unsteadiness on feet (R26.81);Difficulty in walking, not elsewhere classified (R26.2)     Time: 7356-7014 PT Time Calculation (min) (ACUTE ONLY): 36 min  Charges:  $Gait Training: 8-22 mins $Therapeutic Activity: 8-22 mins                      Leighton Roach, PT  Acute Rehab Services  Pager [phone] Office [place] 03/06/2019, 11:35 AM

## 2019-03-06 NOTE — Progress Notes (Signed)
Pt refusing to let staff care for her and insists that she's signing out AMA. Spoke with pt several times to educate on the importance of finishing her treatment and the plan of care. Will continue to monitor pt.

## 2019-03-06 NOTE — Progress Notes (Signed)
Pt threatening to leave AMA. Pt has called her ride that is downstairs in the facility waiting for her. Paged provider to see if it's okay to place the order to remove the patient's IJ and let her leave AMA. Provider spoke with pt as well as her mother in regards to her staying and finishing her treatment. Nurse spoke with the patient and advised her to stay and receive her treatment. Nurse also updated on the plan of care. Charge nurse at the bedside also spoke with pt regarding the POC. Will continue to monitor pt.

## 2019-03-06 NOTE — Progress Notes (Signed)
Patient refused to take any of her morning medication. She states she will take it with lunch.

## 2019-03-06 NOTE — Plan of Care (Signed)
  Problem: Health Behavior/Discharge Planning: Goal: Ability to manage health-related needs will improve Outcome: Adequate for Discharge   Problem: Clinical Measurements: Goal: Ability to maintain clinical measurements within normal limits will improve Outcome: Adequate for Discharge Goal: Will remain free from infection Outcome: Adequate for Discharge Goal: Diagnostic test results will improve Outcome: Adequate for Discharge Goal: Respiratory complications will improve Outcome: Adequate for Discharge Goal: Cardiovascular complication will be avoided Outcome: Adequate for Discharge   Problem: Activity: Goal: Risk for activity intolerance will decrease Outcome: Adequate for Discharge   Problem: Nutrition: Goal: Adequate nutrition will be maintained Outcome: Adequate for Discharge   Problem: Coping: Goal: Level of anxiety will decrease Outcome: Adequate for Discharge   Problem: Elimination: Goal: Will not experience complications related to bowel motility Outcome: Adequate for Discharge Goal: Will not experience complications related to urinary retention Outcome: Adequate for Discharge   Problem: Pain Managment: Goal: General experience of comfort will improve Outcome: Adequate for Discharge   Problem: Skin Integrity: Goal: Risk for impaired skin integrity will decrease Outcome: Adequate for Discharge   Problem: Education: Goal: Knowledge about tracheostomy care/management will improve Outcome: Adequate for Discharge   Problem: Activity: Goal: Ability to tolerate increased activity will improve Outcome: Adequate for Discharge   Problem: Health Behavior/Discharge Planning: Goal: Ability to manage tracheostomy will improve Outcome: Adequate for Discharge   Problem: Respiratory: Goal: Patent airway maintenance will improve Outcome: Adequate for Discharge   Problem: Role Relationship: Goal: Ability to communicate will improve Outcome: Adequate for Discharge

## 2019-03-06 NOTE — Discharge Summary (Signed)
Physician Discharge Summary  Sherrise Liberto YIR:[id number] DOB: 1986-06-25 DOA: 01/20/2019  PCP: Patient, No Pcp Per  Admit date: 01/20/2019 Discharge date: 03/06/2019  Admitted From: Home Disposition: Home  Recommendations for Outpatient Follow-up:  1. Follow up with PCP in 1-2 weeks 2. Follow-up with infectious disease and Suboxone clinic in 2 weeks 3. Please obtain CBC/BMP/Mag at follow up 4. Please follow up on the following pending results: None  Home Health: [facility] PT/OT/RN Equipment/Devices: None  Discharge Condition: Stable CODE STATUS: Full code  Hospital Course: 33 y/o F with a history of IV drug abuse/polysubstance abuse (presented with urine drug screen positive for opiates and amphetamines) and a previous history in June or July 2019 of inadequately treated MRSA endocarditis having signed out AMA at that time. She had a tricuspid vegetation at that time. Admitted 4/4 hypotensive and in respiratory distress requiring intubation at [facility] long. Admitted for acute respiratory failure/septic shock/ AKI due to septic emboli with MSSA bacteremia.  Started on IV antibiotics for MSSA bacteremia, tricuspid valve endocarditis and septic emboli.  Hospital course complicated by seizure, AKI, anemia and thrombocytopenia.  Significant events 4/04 Admit with hypotension / resp distress, intubated  4/05 Family discussion >concern terminal based on sepsis, AKI, septic emboli pancytopenia  4/10 UOP improving, off pressors, low grade fevers  4/11-4/17-failed multiple SBT attempts. 4/17 Trach placement 4/21 Tolerated trach collar trials. 4/23 cdiff result positive overnight with continued diarrhea-started on oral vancomycin 4/26 seizure 4/28 status epilepticus-started on Keppra  5/8- Trach replaced- 6 cuffed  5/12- 2 units PRBCs, trach changed to 4 cuff less  5/14-TRH assumed care.  5/19-decannulated by Marni Griffon at approximately 5009 without complications.  About 1630, patient on   saturating 96 to 98% on room air without respiratory distress.  See individual problem list below for more. Discharge Diagnoses:  MSSA bacteremia with tricuspid valve endocarditis as noted on Echo. See below Septic pulmonary emboli  IVDU E. coli pneumonia -Appreciate infectious disease input -Cefepime and linezolid 5/6-5/13, and ancef through 03/06/2019 per ID -CRP and ESR elevated-no further antibiotic per ID, Dr. Megan Salon after 03/06/2019. -ID follow-up in 2 weeks after discharge.  Respiratory failure with hypoxia status post trach -Prolonged intubation leading to trach -Trach collar 4/17-5/19 -Decannulated on 03/06/2019 maintained oxygen saturation of 96 to 98% on room air  Severe protein calorie malnutrition/dysphagia -Cortrak discontinued on 03/05/2019 -Tolerated oral intake well -Discharge on supplements, multivitamin and vitamin C per nutrition recommendation -Evaluated and cleared by speech. -May benefit from outpatient nutrition follow-up  History of IVDU/back pain: Both patient and mother are interested in Suboxone. -Dr. Daryll Drown from IM kindly saw her on 5/19 and started on Suboxone.   -Discharge on Suboxone -Patient will follow-up with Dr. Daryll Drown outpatient. -Oxycodone discontinued.  Anxiety: has been on Klonopin and PRN Ativan here -I have sent Rx for Klonopin to her pharmacy  Seizure activity/status epilepticus/PRES -Seizure-like activity on 4/26 and status epilepticus on 4/28-no further seizures. -Started and discharged on Keppra 500 mg twice daily. -Dilantin discontinued 5/7 -Outpatient neurology follow-up  Essential hypertension/tachycardia: Blood pressure fairly controlled.  Heart rate 100-110s.  I think this is driven by anxiety and anemia. -Discharged on metoprolol ER 50 mg daily  AKI: Serum creatinine 7.12 on admission.  Unknown baseline.  Likely due to septic shock.  Serum creatinine slightly up trending. -Home ibuprofen discontinued on  discharge -Repeat renal function in a week  Unstageable pressure skin injury: over her mid back. Not able to determine if present on admission.  -No  fluctuance or signs of infection on exam.  -CT lumbar spine 5/17 with endplate irregularity above and below T12-L1 consistent with healed discitis. -Home health nurse ordered on discharge for wound care  Hypokalemia: Resolved. -Continue monitoring  Hypomagnesemia: Resolved  C. difficile colitis -Completed treatment  Anemia of chronic disease/positive Hemoccult: Hemoglobin 5.8 on 5/12 partly dilutional.  Hemoccult positive.  Received 2 units on 5/12 with appropriate response. H&H stable. -Recommend repeat CBC at follow-up  Thrombocytopenia: resolved  Nausea and emesis: resolved. -PRN Zofran  Discharge Instructions  Discharge Instructions    Call MD for:  difficulty breathing, headache or visual disturbances   Complete by:  As directed    Call MD for:  extreme fatigue   Complete by:  As directed    Call MD for:  persistant dizziness or light-headedness   Complete by:  As directed    Call MD for:  persistant nausea and vomiting   Complete by:  As directed    Call MD for:  redness, tenderness, or signs of infection (pain, swelling, redness, odor or green/yellow discharge around incision site)   Complete by:  As directed    Call MD for:  severe uncontrolled pain   Complete by:  As directed    Call MD for:  temperature >100.4   Complete by:  As directed    Diet general   Complete by:  As directed    Increase activity slowly   Complete by:  As directed      Allergies as of 03/06/2019      Reactions   Lactose Intolerance (gi)    Vancomycin Other (See Comments)   Possible contributor to AKI and thrombocytopenia      Medication List    STOP taking these medications   ibuprofen 200 MG tablet Commonly known as:  ADVIL     TAKE these medications   ascorbic acid 250 MG tablet Commonly known as:  VITAMIN C Take 1  tablet (250 mg total) by mouth 2 (two) times daily.   buprenorphine-naloxone 2-0.5 mg Subl SL tablet Commonly known as:  SUBOXONE Place 2 tablets under the tongue 2 (two) times a day. Start taking on:  Mar 07, 2019   clonazePAM 0.5 MG tablet Commonly known as:  KlonoPIN Take 1 tablet (0.5 mg total) by mouth 2 (two) times daily as needed for anxiety.   feeding supplement (ENSURE ENLIVE) Liqd Take 237 mLs by mouth 2 (two) times daily between meals. Start taking on:  Mar 07, 2019   levETIRAcetam 500 MG 24 hr tablet Commonly known as:  KEPPRA XR Take 1 tablet (500 mg total) by mouth every 12 (twelve) hours.   metoprolol succinate 50 MG 24 hr tablet Commonly known as:  TOPROL-XL Take 1 tablet (50 mg total) by mouth daily. Start taking on:  Mar 07, 2019   multivitamin with minerals Tabs tablet Take 1 tablet by mouth daily. Start taking on:  Mar 07, 2019   pantoprazole 40 MG tablet Commonly known as:  PROTONIX Take 1 tablet (40 mg total) by mouth daily. Start taking on:  Mar 07, 2019   senna-docusate 8.6-50 MG tablet Commonly known as:  Senokot-S Take 1 tablet by mouth at bedtime as needed for mild constipation.      Follow-up [place] Follow up on 03/09/2019.   Why:  9:30 for hospital follow up Contact information: [address] [place] [employer] [place] [address] [phone]  [place]. Go in 2 week(s).   Why:  Someone from our clinic will call you with exact time and date Contact information: [address] [place] [phone]          Consultations:  PCCM  Infectious disease  Gastroenterology  Nutrition  Procedures/Studies:  2D Echo: 1. The left ventricle has hyperdynamic systolic function, with an ejection fraction of >65%. The cavity size was normal. There is mildly increased left ventricular wall thickness. Left ventricular diastolic  parameters were normal.  2. The right ventricle has hyperdynamic systolic function. The cavity was mildly enlarged. There is no increase in right ventricular wall thickness.  3. Moderately thickened tricuspid valve leaflets.  4. The mitral valve is abnormal. Mild thickening of the mitral valve leaflet.  5. The tricuspid valve is abnormal. Tricuspid valve regurgitation is moderate-severe.  6. Moderately sized vegetation on the tricuspid valve.  7. The aortic valve is tricuspid.  8. The inferior vena cava was normal in size with <50% respiratory variability.  9. When compared to the prior study: 04/08/2018: LVEF 65-70%, 1.25 x 0.34 cm mobile vegetation with mild to moderate regurgitation.  Dg Abd 1 View  Result Date: 02/27/2019 CLINICAL DATA:  Feeding tube replacement. EXAM: ABDOMEN - 1 VIEW; DG NASO G TUBE PLC W/FL-NO RAD COMPARISON:  Abdominal x-ray dated February 06, 2019. FLUOROSCOPY TIME:  8 minutes, 24 seconds. FINDINGS: Feeding tube tip near the ligament of Treitz with contrast opacification of small bowel. IMPRESSION: Successful feeding tube replacement with tip near the ligament of Treitz. Electronically Signed   By: Titus Dubin M.D.   On: 02/27/2019 16:53   Dg Abd 1 View  Result Date: 02/06/2019 CLINICAL DATA:  33 year old female with gastric tube placement EXAM: ABDOMEN - 1 VIEW COMPARISON:  02/04/2019 FINDINGS: Weighted tip enteric feeding tube terminates in the pyloric region of the stomach. Intrauterine device in place. Opacities at the lung bases, poorly characterized. No evidence of abnormally distended small bowel. IMPRESSION: Weighted tip enteric feeding tube terminates at the pyloric region of the stomach. Electronically Signed   By: Corrie Mckusick D.O.   On: 02/06/2019 14:11   Ct Head Wo Contrast  Result Date: 02/11/2019 CLINICAL DATA:  Seizure EXAM: CT HEAD WITHOUT CONTRAST TECHNIQUE: Contiguous axial images were obtained from the base of the skull through the vertex without  intravenous contrast. COMPARISON:  CT head 01/20/2019 FINDINGS: Brain: Interval development of hypodensity in the occipital lobe bilaterally which is relatively symmetric and appears to involve cortex and white matter. No associated hemorrhage. Ventricle size normal. Small amount of hypodensity extends into the posterior parietal lobe bilaterally. Given the symmetric findings and edema, PRES considered most likely. Vascular: Negative for hyperdense vessel Skull: Negative Sinuses/Orbits: Bilateral mastoid effusion have developed since the prior study. Mild mucosal edema right posterior ethmoid sinus. Negative orbit Other: None IMPRESSION: Interval development of considerable edema in the occipital lobes bilaterally extending into the posterior parietal lobes. This is symmetric. No associated hemorrhage. Favor PRES. Recommend MRI for further evaluation These results were called by telephone at the time of interpretation on 02/11/2019 at 6:38 pm to Dr. Rory Percy , who verbally acknowledged these results. Electronically Signed   By: Franchot Gallo M.D.   On: 02/11/2019 18:39   Ct Lumbar Spine W Contrast  Result Date: 03/06/2019 CLINICAL DATA:  Patient states possible infection in back. History of IV drug abuse with MRSA endocarditis. Back pain. EXAM: CT LUMBAR SPINE WITH CONTRAST TECHNIQUE: Multidetector  CT imaging of the lumbar spine was performed with intravenous contrast administration. CONTRAST:  12m ISOVUE-300 IOPAMIDOL (ISOVUE-300) INJECTION 61% COMPARISON:  MRI lumbar spine 04/11/2018 FINDINGS: Segmentation: Standard.  Incompletely evaluated. Alignment: Exaggerated lumbar lordosis. Trace retrolisthesis L2-L3. Spontaneous arthrodesis across L1-L2, possible old infection. Lateral translation L1 on L2, fused in a position of 7 mm rightward displacement. Vertebrae: Irregular endplates at TX32-T5 which were abnormal and essentially unchanged from 04/11/2018, consistent with old diskitis. Associated slight wedging L1,  superior endplate depression. Schmorl's nodes L2-L3 and L3-L4 appear non worrisome. No sacral erosion. Paraspinal and other soft tissues: Paravertebral muscle atrophy. No visible paravertebral abscess. No pelvic abscess is observed. Visible vessels are patent. There is contrast excretion by both kidneys. Unremarkable visualized bowel loops. There is mild stranding of the superficial subcutaneous fat over the back, likely due to prolonged recumbency, but no worrisome features for abscess. Dialysis catheter. Disc levels: L1-L2: Spontaneous arthrodesis. RIGHT foraminal narrowing is stable. No stenosis. L2-L3:  Trace retrolisthesis.  No impingement. L3-L4:  Unremarkable. L4-L5:  Annular bulge.  Facet arthropathy.  No impingement. L5-S1:  Unremarkable. IMPRESSION: Endplate irregularity above and below T12-L1, consistent with healed diskitis. No current signs of T12 or L1 osteomyelitis. Spontaneous arthrodesis L1-L2, sequelae of old infection. No visible paravertebral or intraspinal abscess/fluid collection. Minor lumbar spondylosis elsewhere. Electronically Signed   By: JStaci RighterM.D.   On: 03/06/2019 08:44   Mr Brain Wo Contrast  Result Date: 02/11/2019 CLINICAL DATA:  Initial evaluation for acute altered mental status. EXAM: MRI HEAD WITHOUT CONTRAST TECHNIQUE: Multiplanar, multiecho pulse sequences of the brain and surrounding structures were obtained without intravenous contrast. COMPARISON:  Prior CT from earlier the same day. FINDINGS: Brain: Examination mildly degraded by motion artifact. Cerebral volume within normal limits for age. Extensive T2/FLAIR signal abnormality seen involving the bilateral cerebral and cerebellar hemispheres, with most prominent involvement at the bilateral parieto-occipital regions. Involvement of both the cortical gray matter and underlying subcortical white matter. Patchy involvement of the right thalamus and periventricular white matter. Overall appearance and distribution  most compatible with PRES. Associated scattered areas of susceptibility artifact compatible with associated petechial hemorrhage/breakdown of the blood brain barrier. No frank intraparenchymal hemorrhage or hemorrhagic transformation. No significant regional mass effect or edema. No evidence for acute or subacute infarct. Gray-white matter differentiation otherwise maintained. No encephalomalacia to suggest chronic infarction. No mass lesion or midline shift. No hydrocephalus. No extra-axial fluid collection. Pituitary gland suprasellar region normal. Midline structures intact. Vascular: Major intracranial vascular flow voids are well maintained. Skull and upper cervical spine: Craniocervical junction normal. Upper cervical spine within normal limits. Bone marrow signal intensity normal. No scalp soft tissue abnormality. Sinuses/Orbits: Globes orbital soft tissues within normal limits. Mild opacification of the posterior right ethmoidal air cells. Paranasal sinuses are otherwise clear. Bilateral mastoid effusions with opacification of the middle ear cavities bilaterally. Other: None. IMPRESSION: 1. Scattered abnormal T2/FLAIR signal abnormality involving the bilateral cerebral and cerebellar hemispheres, with most notable involvement involving the parieto-occipital regions bilaterally. Findings most consistent with PRES. Associated scattered areas of susceptibility effect compatible with petechial hemorrhage/blood-brain barrier breakdown without frank hemorrhagic transformation. 2. Bilateral mastoid effusions with opacification of the middle ear cavities, indeterminate. Correlation with physical exam recommended. Electronically Signed   By: BJeannine BogaM.D.   On: 02/11/2019 23:05   Ir Fluoro Guide Cv Line Right  Result Date: 02/05/2019 CLINICAL DATA:  Sepsis, endocarditis and need for long-term IV antibiotics. EXAM: TUNNELED CENTRAL VENOUS CATHETER PLACEMENT WITH ULTRASOUND AND FLUOROSCOPIC  GUIDANCE  ANESTHESIA/SEDATION: None MEDICATIONS: None FLUOROSCOPY TIME:  12 seconds.  3.2 mGy. PROCEDURE: The procedure, risks, benefits, and alternatives were explained to the patient's mother. Questions regarding the procedure were encouraged and answered. The patient's mother understands and consents to the procedure. A timeout was performed prior to initiating the procedure. The right neck and chest were prepped with chlorhexidine in a sterile fashion, and a sterile drape was applied covering the operative field. Maximum barrier sterile technique with sterile gowns and gloves were used for the procedure. Local anesthesia was provided with 1% lidocaine. Ultrasound was used to confirm patency of the right internal jugular vein. After creating a small venotomy incision, a 21 gauge needle was advanced into the right internal jugular vein under direct, real-time ultrasound guidance. Ultrasound image documentation was performed. After securing guidewire access, a 6 French peel-away sheath was placed. A wire was kinked to measure appropriate catheter length. A 6 Fr, dual lumen Power line tunneled central venous catheter was chosen for placement. This was tunneled in a retrograde fashion from the chest wall to the venotomy incision. The catheter was cut to 21 cm based on guidewire measurement. The catheter was then placed through the peel-away sheath and the sheath removed. Final catheter positioning was confirmed and documented with a fluoroscopic spot image. The catheter was aspirated and flushed with saline. The venotomy incision was closed with subcutaneous 4-0 Vicryl. Dermabond was applied to the incision. The catheter exit site was secured with Prolene retention sutures. COMPLICATIONS: None.  No pneumothorax. FINDINGS: After catheter placement, the tip lies at the SVC/RA junction. The catheter aspirates normally and is ready for immediate use. IMPRESSION: Placement of tunneled central venous catheter via the right internal  jugular vein. The catheter tip lies at the SVC/RA junction. The catheter is ready for immediate use. Electronically Signed   By: Aletta Edouard M.D.   On: 02/05/2019 16:55   Ir Removal Tun Cv Cath W/o Fl  Result Date: 03/06/2019 INDICATION: Patient with history of sepsis and endocarditis requiring long-term IV antibiotics s/p placement of tunneled right IJ CVC in IR 02/05/2019 by Dr. Kathlene Cote. Patient is up for discharge. Request is made for removal of tunneled CVC. EXAM: REMOVAL OF TUNNELED CENTRAL VENOUS CATHETER MEDICATIONS: None COMPLICATIONS: None immediate. PROCEDURE: Informed written consent was obtained from the patient following an explanation of the procedure, risks, benefits and alternatives to treatment. A time out was performed prior to the initiation of the procedure. Maximal barrier sterile technique was utilized including caps, mask, sterile gloves, large sterile drape, hand hygiene, and chlorhexidine. Utilizing gentle traction, the catheter was removed intact. Hemostasis was obtained with manual compression. A dressing was placed. The patient tolerated the procedure well without immediate post procedural complication. IMPRESSION: Successful removal of tunneled central venous catheter. Read by: Earley Abide, PA-C Electronically Signed   By: Markus Daft M.D.   On: 03/06/2019 15:54   Ir US Guide Vasc Access Right  Result Date: 02/05/2019 CLINICAL DATA:  Sepsis, endocarditis and need for long-term IV antibiotics. EXAM: TUNNELED CENTRAL VENOUS CATHETER PLACEMENT WITH ULTRASOUND AND FLUOROSCOPIC GUIDANCE ANESTHESIA/SEDATION: None MEDICATIONS: None FLUOROSCOPY TIME:  12 seconds.  3.2 mGy. PROCEDURE: The procedure, risks, benefits, and alternatives were explained to the patient's mother. Questions regarding the procedure were encouraged and answered. The patient's mother understands and consents to the procedure. A timeout was performed prior to initiating the procedure. The right neck and chest  were prepped with chlorhexidine in a sterile fashion, and a sterile drape was applied  covering the operative field. Maximum barrier sterile technique with sterile gowns and gloves were used for the procedure. Local anesthesia was provided with 1% lidocaine. Ultrasound was used to confirm patency of the right internal jugular vein. After creating a small venotomy incision, a 21 gauge needle was advanced into the right internal jugular vein under direct, real-time ultrasound guidance. Ultrasound image documentation was performed. After securing guidewire access, a 6 French peel-away sheath was placed. A wire was kinked to measure appropriate catheter length. A 6 Fr, dual lumen Power line tunneled central venous catheter was chosen for placement. This was tunneled in a retrograde fashion from the chest wall to the venotomy incision. The catheter was cut to 21 cm based on guidewire measurement. The catheter was then placed through the peel-away sheath and the sheath removed. Final catheter positioning was confirmed and documented with a fluoroscopic spot image. The catheter was aspirated and flushed with saline. The venotomy incision was closed with subcutaneous 4-0 Vicryl. Dermabond was applied to the incision. The catheter exit site was secured with Prolene retention sutures. COMPLICATIONS: None.  No pneumothorax. FINDINGS: After catheter placement, the tip lies at the SVC/RA junction. The catheter aspirates normally and is ready for immediate use. IMPRESSION: Placement of tunneled central venous catheter via the right internal jugular vein. The catheter tip lies at the SVC/RA junction. The catheter is ready for immediate use. Electronically Signed   By: Aletta Edouard M.D.   On: 02/05/2019 16:55   Dg Chest Port 1 View  Result Date: 02/19/2019 CLINICAL DATA:  Dyspnea EXAM: PORTABLE CHEST 1 VIEW COMPARISON:  Two days ago FINDINGS: Tracheostomy tube in place. Tunneled central line with tip at the upper right atrium.  Feeding tube at least reaches the stomach. Low volume chest with hazy opacity and cavities on the left. There is lower volumes with increased hazy density at the bases. Borderline heart size that is stable. No apically pneumothorax. IMPRESSION: 1. Stable hardware positioning. 2. Septic pulmonary emboli. Mild increase in lower chest opacification in the setting of lower volumes. Electronically Signed   By: Monte Fantasia M.D.   On: 02/19/2019 05:35   Dg Chest Port 1 View  Result Date: 02/17/2019 CLINICAL DATA:  Respiratory failure. EXAM: PORTABLE CHEST 1 VIEW COMPARISON:  Chest x-ray from yesterday. FINDINGS: Unchanged tracheostomy and feeding tubes. Unchanged tunneled right internal jugular central venous catheter. Stable normal cardiomediastinal silhouette. Unchanged mild diffuse reticulonodular interstitial thickening and cavitary nodules in the right upper lobe and left mid lung. Unchanged trace bilateral pleural effusions. No pneumothorax. No acute osseous abnormality. IMPRESSION: 1. Stable chest. No interval change in septic pulmonary emboli and trace bilateral pleural effusions. Electronically Signed   By: Titus Dubin M.D.   On: 02/17/2019 06:57   Dg Chest Port 1 View  Result Date: 02/16/2019 CLINICAL DATA:  Respiratory failure. EXAM: PORTABLE CHEST 1 VIEW COMPARISON:  Radiograph of February 02, 2019. FINDINGS: Stable cardiomediastinal silhouette. Tracheostomy tube is unchanged in position. Interval placement of feeding tube which is seen entering stomach. Interval placement of right internal jugular catheter with distal tip in expected position of right atrium. No pneumothorax is noted. Stable bilateral lung opacities are noted concerning for pneumonia. Minimal bilateral pleural effusions are noted. Bony thorax is unremarkable. IMPRESSION: Interval placement of feeding tube and right internal jugular catheter. Stable bilateral lung opacities and minimal pleural effusions are noted. Electronically  Signed   By: Marijo Conception M.D.   On: 02/16/2019 07:23   Dg Loyce Dys Tube  Plc W/fl-no Rad  Result Date: 02/27/2019 CLINICAL DATA:  Feeding tube replacement. EXAM: ABDOMEN - 1 VIEW; DG NASO G TUBE PLC W/FL-NO RAD COMPARISON:  Abdominal x-ray dated February 06, 2019. FLUOROSCOPY TIME:  8 minutes, 24 seconds. FINDINGS: Feeding tube tip near the ligament of Treitz with contrast opacification of small bowel. IMPRESSION: Successful feeding tube replacement with tip near the ligament of Treitz. Electronically Signed   By: Titus Dubin M.D.   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   Abscess of skin   buttock - most recently 2009/10  CKD (chronic kidney disease) stage 3, GFR 30-59 ml/min (HCC)   Drug-seeking behavior   Hypersplenism   IV drug user   MRSA (methicillin resistant Staphylococcus aureus) infection   MRSA bacteremia   Pancytopenia ([place])   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)   Septic pulmonary embolism ([place])   Tobacco abuse  Past Surgical History: Past Surgical History: Procedure Laterality Date  IR FLUORO GUIDE CV LINE RIGHT  02/05/2019  IR GENERIC HISTORICAL  07/19/2016  IR LUMBAR DISC ASPIRATION W/IMG GUIDE 07/19/2016 Arne Cleveland, MD [facility]  IR US GUIDE VASC ACCESS RIGHT  02/05/2019  RADIOLOGY WITH ANESTHESIA N/A 04/11/2018  Procedure: MRI OF LUMBAR AND THORACIC SPINE WITHOUT CONTRAST WITH ANESTHESIA;  Surgeon: Radiologist, Medication, MD;  Location: [place];  Service: Radiology;  Laterality: N/A;  TEE WITHOUT CARDIOVERSION N/A  07/04/2013  Procedure: TRANSESOPHAGEAL ECHOCARDIOGRAM (TEE);  Surgeon: Larey Dresser, MD;  Location: [facility] ENDOSCOPY;  Service: Cardiovascular;  Laterality: N/A;  TONSILLECTOMY    33 years old HPI: Carianne Taira 33 year old female who presented to the emergency department with altered mental status.  Patient was admitted to the hospital with working diagnosis of sepsis with multiorgan failure and possible DIC.  Patient condition deteriorated and required invasive mechanical ventilation 4/4.  CT of the chest was done which was positive for septic emboli and right base pneumonia.  Blood cultures grew MSSA.  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  Patient has history of polysubstance/IV drug abuse.  Patient has been on antibiotic therapy with cefazolin.  Underwent tracheostomy on 6/37, which was complicated by bleeding.  No data recorded Assessment / Plan / Recommendation CHL IP CLINICAL IMPRESSIONS 02/08/2019 Clinical Impression Pt demonstrates normal oral phase, strong pharyngeal/laryngeal musculature with but intermittent delayed swallow response. In many instances pt demonstrates immediate timely swallow resposne, but in some cases with necta rand thin, liquids spill to pyriforms prior to laryngeal elevation/hyoid burst. In one instance thin liquid pooled in the pyriforms was aspirated via interarytenoid space as swallow initiated. Presence of NG tube may have contributed. There was a delayed cough, but given no redirection or airflow above trach (pt cannot tolerate PMSV), aspirate was not mobilized. Trials of solids, puree and nectar, even with consecutive straw sips, were tolerated without incident. Recommend pt initiate a regular texture diet and nectar thick liquids. She will likely become even more timely and automatic with intake and NG tube removal. Pt will best protect her airway with thin liquids if she can cough effectively; recommend downsize of trach to 4  cuffed for potential  PMSV tolerance.  SLP Visit Diagnosis Dysphagia, oropharyngeal phase (R13.12) Attention and concentration deficit following -- Frontal lobe and executive function deficit following -- Impact on safety and function Moderate aspiration risk   CHL IP TREATMENT RECOMMENDATION 02/08/2019 Treatment Recommendations Therapy as outlined in treatment plan below   No flowsheet data found. CHL IP DIET RECOMMENDATION 02/08/2019 SLP Diet Recommendations Regular solids;Nectar thick liquid Liquid Administration via Cup;Straw Medication Administration Whole meds with liquid Compensations Minimize environmental distractions Postural Changes Seated upright at 90 degrees   CHL IP OTHER RECOMMENDATIONS 02/08/2019 Recommended Consults -- Oral Care Recommendations Oral care BID Other Recommendations Order thickener from pharmacy;Have oral suction available   CHL IP FOLLOW UP RECOMMENDATIONS 02/08/2019 Follow up Recommendations Inpatient Rehab   CHL IP FREQUENCY AND DURATION 02/08/2019 Speech Therapy Frequency (ACUTE ONLY) min 2x/week Treatment Duration 2 weeks      CHL IP ORAL PHASE 02/08/2019 Oral Phase WFL Oral - Pudding Teaspoon -- Oral - Pudding Cup -- Oral - Honey Teaspoon -- Oral - Honey Cup -- Oral - Nectar Teaspoon -- Oral - Nectar Cup -- Oral - Nectar Straw -- Oral - Thin Teaspoon -- Oral - Thin Cup -- Oral - Thin Straw -- Oral - Puree -- Oral - Mech Soft -- Oral - Regular -- Oral - Multi-Consistency -- Oral - Pill -- Oral Phase - Comment --  CHL IP PHARYNGEAL PHASE 02/08/2019 Pharyngeal Phase Impaired Pharyngeal- Pudding Teaspoon -- Pharyngeal -- Pharyngeal- Pudding Cup -- Pharyngeal -- Pharyngeal- Honey Teaspoon -- Pharyngeal -- Pharyngeal- Honey Cup -- Pharyngeal -- Pharyngeal- Nectar Teaspoon -- Pharyngeal -- Pharyngeal- Nectar Cup -- Pharyngeal -- Pharyngeal- Nectar Straw Delayed swallow initiation-pyriform sinuses Pharyngeal -- Pharyngeal- Thin Teaspoon -- Pharyngeal -- Pharyngeal- Thin Cup Delayed swallow initiation-pyriform  sinuses Pharyngeal -- Pharyngeal- Thin Straw Delayed swallow initiation-pyriform sinuses;Penetration/Aspiration during swallow;Penetration/Aspiration before swallow;Moderate aspiration Pharyngeal Material enters airway, passes BELOW cords and not ejected out despite cough attempt by patient;Material does not enter airway Pharyngeal- Puree Delayed swallow initiation-vallecula Pharyngeal -- Pharyngeal- Mechanical Soft -- Pharyngeal -- Pharyngeal- Regular Delayed swallow initiation-vallecula Pharyngeal -- Pharyngeal- Multi-consistency -- Pharyngeal -- Pharyngeal- Pill -- Pharyngeal -- Pharyngeal Comment --  No flowsheet data found. Herbie Baltimore, MA CCC-SLP Acute Rehabilitation Services Pager [phone] Office [phone] Lynann Beaver 02/08/2019, 11:41 AM              Vas [place] Lower Extremity Venous (dvt)  Result Date: 02/05/2019  Lower Venous Study Indications: Swelling, and Polysubstance abuse. IV drug abuse. Septic pulmonary emboli.  Performing Technologist: Maudry Mayhew RDMS, RVT, RDCS  Examination Guidelines: A complete evaluation includes B-mode imaging, spectral Doppler, color Doppler, and power Doppler as needed of all accessible portions of each vessel. Bilateral testing is considered an integral part of a complete examination. Limited examinations for reoccurring indications may be performed as noted.  +---------+---------------+---------+-----------+----------+--------------+  RIGHT     Compressibility Phasicity Spontaneity Properties Summary         +---------+---------------+---------+-----------+----------+--------------+  CFV       Full            No        Yes                    Pulsatile flow  +---------+---------------+---------+-----------+----------+--------------+  SFJ       Full                                                             +---------+---------------+---------+-----------+----------+--------------+  FV Prox   Full                                                              +---------+---------------+---------+-----------+----------+--------------+  FV Mid    Full                                                             +---------+---------------+---------+-----------+----------+--------------+  FV Distal Full                                                             +---------+---------------+---------+-----------+----------+--------------+  PFV       Full                                                             +---------+---------------+---------+-----------+----------+--------------+  POP       Full            No        Yes                    Pulsatile flow  +---------+---------------+---------+-----------+----------+--------------+  PTV       Full                                                             +---------+---------------+---------+-----------+----------+--------------+  PERO      Full                                                             +---------+---------------+---------+-----------+----------+--------------+   +---------+---------------+---------+-----------+----------+--------------+  LEFT      Compressibility Phasicity Spontaneity Properties Summary         +---------+---------------+---------+-----------+----------+--------------+  CFV       Full            No        Yes                    Pulsatile flow  +---------+---------------+---------+-----------+----------+--------------+  SFJ       Full                                                             +---------+---------------+---------+-----------+----------+--------------+  FV Prox   Full                                                             +---------+---------------+---------+-----------+----------+--------------+  FV Mid    Full                                                             +---------+---------------+---------+-----------+----------+--------------+  FV Distal Full                                                              +---------+---------------+---------+-----------+----------+--------------+  PFV       Full                                                             +---------+---------------+---------+-----------+----------+--------------+  POP       Full            No        Yes                    Pulsatile flow  +---------+---------------+---------+-----------+----------+--------------+  PTV       Full                                                             +---------+---------------+---------+-----------+----------+--------------+  PERO      Full                                                             +---------+---------------+---------+-----------+----------+--------------+     Summary: Right: There is no evidence of deep vein thrombosis in the lower extremity. No cystic structure found in the popliteal fossa. Left: There is no evidence of deep vein thrombosis in the lower extremity. No cystic structure found in the popliteal fossa.  Bilateral lower extremity veins exhibit pulsatile flow, suggestive of elevated right-sided heart pressure. *See table(s) above for measurements and observations. Electronically signed by Harold Barban MD on 02/05/2019 at 1:02:23 PM.    Final    [place] Ekg Site Rite  Result Date: 02/20/2019 If Site Rite image not attached, placement could not be confirmed due to current cardiac rhythm.     Subjective: Very eager to go home.  She threatened to leave AMA in the morning when she was told to stay for decannulation until tomorrow.  She was seen by PCCM later in the day who went ahead and decannulated the trach.  She remained stable after decannulation and discharged home.   Discharge Exam: Vitals:   03/06/19 0731 03/06/19 1130  BP:    Pulse: (!) 102   Resp: 17   Temp:    SpO2: 98% 99%    GENERAL: No acute distress.  Upset about not going home today. HEENT: MMM.  Vision and hearing grossly intact.  NECK: Supple.  No apparent JVD. LUNGS:  No IWOB. Good air movement  bilaterally. HEART: HR 100-110.  Regular rhythm. Heart sounds normal.  1+ pitting edema bilaterally. ABD: Bowel sounds present. Soft. Non tender.  MSK/EXT:  Moves all extremities. No apparent deformity. No edema bilaterally.  SKIN: Unstageable pressure ulcer over her buttock.  No signs of active infection.  See picture below. NEURO: Awake, alert and oriented appropriately.  No gross deficit.  PSYCH: Upset.  Labile  Mid back region on 03/04/2019    Mid back region on 03/02/2019     The results of significant diagnostics from this hospitalization (including imaging, microbiology, ancillary and laboratory) are listed below for reference.     Microbiology: No results found for this or any previous visit (from the past 240 hour(s)).   Labs: BNP (last 3 results) Recent Labs    04/07/18 2200 04/11/18 0900 04/22/18 1449  BNP 75.2 518.4* 035.4*   Basic Metabolic Panel: Recent Labs  Lab 02/28/19 0907 03/02/19 0800 03/03/19 0435 03/04/19 0745 03/05/19 0740 03/05/19 1844 03/06/19 0819  NA 137 137  --  134*  --  135 137  K 4.7 3.8  --  2.9*  --  3.5 4.3  CL 111 108  --  108  --  110 110  CO2 17* 21*  --  18*  --  16* 17*  GLUCOSE 110* 98  --  101*  --  158* 103*  BUN 45* 30*  --  25*  --  21* 22*  CREATININE 1.25* 1.21*  --  1.36*  --  1.41* 1.48*  CALCIUM 7.9* 7.8*  --  7.7*  --  7.7* 8.0*  MG 1.7 1.4* 2.0 1.7 2.1  --  1.8   Liver Function Tests: No results for input(s): AST, ALT, ALKPHOS, BILITOT, PROT, ALBUMIN in the last 168 hours. No results for input(s): LIPASE, AMYLASE in the last 168 hours. No results for input(s): AMMONIA in the last 168 hours. CBC: Recent Labs  Lab 02/28/19 0517  03/02/19 0800 03/03/19 0820 03/04/19 0745 03/05/19 0740 03/06/19 0819  WBC 6.8  --   --   --   --   --   --   HGB 8.8*   < > 9.4* 8.7* 8.4* 8.9* 8.9*  HCT 27.6*   < > 29.0* 26.8* 26.2* 27.4* 27.4*  MCV 93.6  --   --   --   --   --   --   PLT 159  --   --   --   --   --   --     < > = values in this interval not displayed.   Cardiac Enzymes: No results for input(s): CKTOTAL, CKMB, CKMBINDEX, TROPONINI in the last 168 hours. BNP: Invalid input(s): POCBNP CBG: Recent Labs  Lab 02/27/19 1726 02/28/19 0033 02/28/19 0743 02/28/19 1532 03/01/19 0120  GLUCAP 92 108* 94 89 99   D-Dimer No results for input(s): DDIMER in the last 72 hours. Hgb A1c No results for input(s): HGBA1C in  the last 72 hours. Lipid Profile No results for input(s): CHOL, HDL, LDLCALC, TRIG, CHOLHDL, LDLDIRECT in the last 72 hours. Thyroid function studies No results for input(s): TSH, T4TOTAL, T3FREE, THYROIDAB in the last 72 hours.  Invalid input(s): FREET3 Anemia work up No results for input(s): VITAMINB12, FOLATE, FERRITIN, TIBC, IRON, RETICCTPCT in the last 72 hours. Urinalysis    Component Value Date/Time   COLORURINE AMBER (A) 01/20/2019 0252   APPEARANCEUR CLOUDY (A) 01/20/2019 0252   LABSPEC 1.020 01/20/2019 0252   PHURINE 5.0 01/20/2019 0252   GLUCOSEU NEGATIVE 01/20/2019 0252   HGBUR LARGE (A) 01/20/2019 0252   BILIRUBINUR NEGATIVE 01/20/2019 [place] 01/20/2019 0252   PROTEINUR 100 (A) 01/20/2019 0252   NITRITE NEGATIVE 01/20/2019 0252   LEUKOCYTESUR TRACE (A) 01/20/2019 0252   Sepsis Labs Invalid input(s): PROCALCITONIN,  WBC,  LACTICIDVEN   Time coordinating discharge: 45 minutes  SIGNED:  Mercy Riding, MD  [facility] 03/06/2019, 4:48 PM Pager [phone]  If 7PM-7AM, please contact night-coverage [email or web address] Password [email or web address]

## 2019-03-06 NOTE — Consult Note (Signed)
Consult note - initiation of buprenorphine-naloxone   03/06/2019  Molly [name] is in the hospital.  I was requested to see Molly [name] to assess her for initiation of buprenorphine-naloxone and entry into our clinic.    I have reviewed EPIC data including labwork which was available.  The salient points were confirmed with the patient.    Review of substance use history (first use, substances used, any illicit purchases): Molly [name] reports that she started using opiates at 33 years old.  She was on pills for ovarian cysts which were very painful for her. The medication that she was using was discontinued and then she switched to heroin.  She was using this medication IV for a while, but then switched to snorting heroin most recently.  She would use 3 times per day and bought about $20 of heroin per day.  She has been doing this for a few years now. She has used other substances such as cocaine and pot, however, she prefers heroin.  Her drug screen on admission also showed methamphetamine.  She reports no history of overdose.  She has tried to quit before and has used multiple different versions of buprenorphine including Zubsolv, Suboxone (films and tablets) and Subutex.  She reports that the longest she has ever been able to be clean is this hospitalization (Admitted 01/20/19 - no heroin use for >6 weeks while she has been here).  She has a medical history of an enlarged spleen and her recent infections including tricuspid valve endocarditis, septic emboli to the lungs, sacral osteomyelitis.  She currently has a tracheostomy in place and has had a prolonged hospitalization.  Her main fear with being treated is going in to withdrawal.  Her dose of Suboxone previously was 8-2mg  BID, but she states this caused her nausea and she thinks it was too strong.    Last substance used: Oxycodone 5mg  this AM at 1018AM.  Has been on opiates of some form since being admitted.    Mental Health History: She reports a  history of anxiety, depression and bipolar disorder.  She was treated with medication in high school, but has not been on anything since that time.  She reports that the medications she was on did not agree with her so she stopped taking them.    Current counseling/behavioural health provider: None  This patient has Opioid Use Disorder by following DSM-V criteria:  - Opioids taken in larger amounts or over a longer period than intended - Persistent desire to cut down - A great deal of time is spent to obtain/use/recover from the opioid - Cravings to use opioids - Use resulting in a failure to fulfill major role obligations - Continue opioid use despite persistent social or interpersonal problems - Important activities are given up or reduced because of opioid use - Recurrent opioid use in situations in which it is physically hazardous - Use despite knowledge of health problems caused by opioids - Tolerance - Withdrawal  Past Medical History:  Diagnosis Date  . 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 Abscess of skin    buttock - most recently 2009/10  . CKD (chronic kidney disease) stage 3, GFR 30-59 ml/min (HCC)   . Drug-seeking behavior   . Hypersplenism   . IV drug user   . MRSA (methicillin resistant Staphylococcus aureus) infection   . MRSA bacteremia   . Pancytopenia ([place])   . 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 Septic pulmonary embolism ([place])   .  Tobacco abuse     No current facility-administered medications on file prior to encounter.    Current Outpatient Medications on File Prior to Encounter  Medication Sig Dispense Refill  . ibuprofen (ADVIL,MOTRIN) 200 MG tablet Take 200-400 mg by mouth every 6 (six) hours as needed for moderate pain.     Marland Kitchen senna-docusate (SENOKOT-S) 8.6-50 MG tablet Take 1 tablet by mouth at bedtime as needed for mild constipation. (Patient not taking: Reported on 07/12/2017) 15 tablet 0    Physical Exam  Vitals:   03/05/19  2338 03/06/19 0145 03/06/19 0409 03/06/19 0731  BP:   (!) 137/116   Pulse: (!) 101  (!) 106 (!) 102  Resp: 18  16 17   Temp:  98.1 F (36.7 C) 98.1 F (36.7 C)   TempSrc:  Oral Oral   SpO2: 100%   98%  Weight:      Height:       General: Sitting in chair, alert, oriented Neck: Trach and trach collar in place Pulm: Breathing comfortably, no audible wheezing Psych: Temperamental, frustrated when told she might not go home today.    Clinical Opiate Withdrawal Scale: bold applicable COWS scoring   - Resting HR:    - 0 for < 80   - 1 for 81 - 100   - 2 for 101 - 120   - 4 for > 120  - Sweating:   - 0 for no chills/flushing   - 1 for subjective chills/flushing   - 3 for beads of sweat on brow/face   - 4 for sweat streaming off of face  - Restlessness:    - 0 for able to sit still   - 1 for subjective difficulty sitting still   - 3 for frequent shifting or extraneous movement   - 5 for unable to sit still for more than a few seconds  - Pupil size:    - 0 for pinpoint or normal   - 1 for possibly larger than normal   - 2 for moderately dilated   - 5 for only iris rim visible  - Bone/joint pain:    - 0 for not present   - 1 for mild diffuse discomfort   - 2 severe diffuse aching   - 4 for objectively rubbing joints/muscles and obviously in pain  - Runny nose/tearing:    - 0 for not present   - 1 for stuffy nose/moist eyes   - 2 for nose running/tearing   - 4 for nose constantly running or tears streaming down cheeks  - GI Upset:    - 0 for no GI symptoms   - 1 for stomach cramps   - 2 for nausea or loose stool   - 3 for vomiting or diarrhea   - 5 for multiple episodes of vomiting or diarrhea  - Tremor observation of outstretched hands:    - 0 for no tremor   - 1 for tremor can be felt but not observed   - 2 for slight tremor observable   - 4 for gross tremor or muscle twitching  - Yawning:    - 0 for no yawning   - 1 for yawning once or twice during assessment   -  2 for yawning three or more times during assessment   - 4 for yawning several times per minute  - Anxiety or irritability:    - 0 for none   - 1 for patient reports increasing irritability or anxiousness   -  2 for patient obviously irritable/anxious   - 4 for patient so irritable/anxious that assessment is difficult  - Gooseflesh:    - 0 for skin is smooth   - 3 for piloerection of skin can be felt or seen   - 5 for prominent piloerection  TOTAL: 3.  She just had oxycodone 20 min prior to my assessment.   Assessment/Plan:   Based on a review of the patient's medical history including substance use and mental health factors, and physical exam, Molly [name] is a suitable candidate for OBOT with buprenorphine/naloxone.  I discussed with her our clinic and expectations.  She is from [place], but would be able to follow up here in [place].  She notes that she would not like to start buprenorphine until tomorrow morning.  She feels that she will go into precipitated withdrawal if she took it sooner than 24 hours past her last oxycodone dose.  I explained to her that we would stop her oxycodone now and have her take buprenorphine tomorrow morning.  We will start her on 4-1mg  of buprenorphine-naloxone given her previous issues with nausea.  If she is in the hospital tomorrow, I will check on her after the first dose and we can re-dose with 4-2mg  in 1 hour if she is still having symptoms of withdrawal.  If she is home, I will send her with instructions for home induction.  She is aware of how to take the medication, including the SL administration and allowing the medication to dissolve.  She would prefer the films.  I printed a discount card for her to her preferred pharmacy, [facility].  If she is on the 4-1mg  BID dosing at time of discharge, she will have a 2 weeks supply.  I will give her instructions to call the clinic if she runs out of medication.   Our clinic is starting in person appointments  for OUD on 6/2.  I will schedule her to be seen by Dr. Evette Doffing on that day.    HIV non reaction 04/08/2018 HCV not detected 04/08/2018 LFTs normal on 02/17/19  Intervisit Care:  We discussed this medication must be kept in a safe place and away from children.   We will see the patient back in 2 weeks in clinic, with options for a sooner appointment based on patient and provider preference.   Patient was encouraged to call the office and speak with the MD on call for any urgent concerns.    Sid Falcon, MD 03/06/2019 11:11 AM

## 2019-03-09 ENCOUNTER — Encounter: Payer: Self-pay | Admitting: Nurse Practitioner

## 2019-03-09 ENCOUNTER — Ambulatory Visit: Payer: Self-pay | Attending: Nurse Practitioner | Admitting: Nurse Practitioner

## 2019-03-09 ENCOUNTER — Other Ambulatory Visit: Payer: Self-pay

## 2019-03-09 NOTE — Progress Notes (Signed)
CMA was asking patient question for new patient and screening for PHQ9 and GAD7.  Pt. Was being irritated and frustrated over the phone and hung up.

## 2019-03-09 NOTE — Progress Notes (Signed)
This encounter was created in error - please disregard.

## 2019-03-13 ENCOUNTER — Telehealth: Payer: Self-pay

## 2019-03-13 NOTE — Telephone Encounter (Signed)
Also, we do not address acute issues such as those listed.  She will need to call her PCP or ID doctor for these issues.  Agree with emergent care.  The only thing our clinic can address for her is her buprenorphine prescription and active opiate use disorder.  No pain medications beyond buprenorphine will be provided and chronic care management needs to go through her PCP.  We have not accepted her into the Internal Medicine clinic for chronic health care.   If she or her mother calls back, can you make this clear to her?  She will need to call the appropriate clinic going forward.    Thank you!

## 2019-03-13 NOTE — Telephone Encounter (Signed)
[name] mother calls and states pt is having blood in her urine, lots of pain in her back and in general all over. She states pt also has an open place on her back but it is drying up but still open. States she needs medicine for all the pain. She is informed that pt needs to go to ED asap if she is having blood in her urine as much as described and the pain is as great as pt has voiced to her. She is reminded of appt in oud, time and date. She states she will talk to pt about ED visit. Stressed again that pt could be having serious problems that need emergent care.

## 2019-03-13 NOTE — Telephone Encounter (Signed)
[name] mother requesting to speak with a nurse about OUD appt. Please call back.

## 2019-03-13 NOTE — Telephone Encounter (Signed)
When nurse spoke to pt's mother, pcp zelda fleming and id clinic dr hatcher were suggested to mother for contacting and emphasized that imc addresses the oud problem only. She was not pleased but she was agreeable w/ hesitancy.

## 2019-03-14 ENCOUNTER — Other Ambulatory Visit: Payer: Self-pay

## 2019-03-14 ENCOUNTER — Emergency Department (HOSPITAL_COMMUNITY)
Admission: EM | Admit: 2019-03-14 | Discharge: 2019-03-14 | Discharge status: Against Medical Advice | Payer: Medicaid Other | Attending: Emergency Medicine | Admitting: Emergency Medicine

## 2019-03-14 ENCOUNTER — Encounter (HOSPITAL_COMMUNITY): Payer: Self-pay | Admitting: Student

## 2019-03-14 ENCOUNTER — Emergency Department (HOSPITAL_COMMUNITY): Payer: Medicaid Other

## 2019-03-14 DIAGNOSIS — Z79899 Other long term (current) drug therapy: Secondary | ICD-10-CM | POA: Insufficient documentation

## 2019-03-14 DIAGNOSIS — D649 Anemia, unspecified: Secondary | ICD-10-CM | POA: Insufficient documentation

## 2019-03-14 DIAGNOSIS — N183 Chronic kidney disease, stage 3 (moderate): Secondary | ICD-10-CM | POA: Diagnosis not present

## 2019-03-14 DIAGNOSIS — R609 Edema, unspecified: Secondary | ICD-10-CM | POA: Insufficient documentation

## 2019-03-14 DIAGNOSIS — F17210 Nicotine dependence, cigarettes, uncomplicated: Secondary | ICD-10-CM | POA: Insufficient documentation

## 2019-03-14 DIAGNOSIS — E8809 Other disorders of plasma-protein metabolism, not elsewhere classified: Secondary | ICD-10-CM | POA: Diagnosis not present

## 2019-03-14 DIAGNOSIS — R600 Localized edema: Secondary | ICD-10-CM

## 2019-03-14 LAB — COMPREHENSIVE METABOLIC PANEL
ALT: 29 U/L (ref 0–44)
AST: 124 U/L — ABNORMAL HIGH (ref 15–41)
Albumin: 2.1 g/dL — ABNORMAL LOW (ref 3.5–5.0)
Alkaline Phosphatase: 152 U/L — ABNORMAL HIGH (ref 38–126)
Anion gap: 13 (ref 5–15)
BUN: 18 mg/dL (ref 6–20)
CO2: 19 mmol/L — ABNORMAL LOW (ref 22–32)
Calcium: 7.5 mg/dL — ABNORMAL LOW (ref 8.9–10.3)
Chloride: 105 mmol/L (ref 98–111)
Creatinine, Ser: 1.84 mg/dL — ABNORMAL HIGH (ref 0.44–1.00)
GFR calc Af Amer: 42 mL/min — ABNORMAL LOW (ref >60)
GFR calc non Af Amer: 36 mL/min — ABNORMAL LOW (ref >60)
Glucose, Bld: 105 mg/dL — ABNORMAL HIGH (ref 70–99)
Potassium: 3.3 mmol/L — ABNORMAL LOW (ref 3.5–5.1)
Sodium: 137 mmol/L (ref 135–145)
Total Bilirubin: 0.6 mg/dL (ref 0.3–1.2)
Total Protein: 7 g/dL (ref 6.5–8.1)

## 2019-03-14 LAB — URINALYSIS, ROUTINE W REFLEX MICROSCOPIC
Bilirubin Urine: NEGATIVE
Glucose, UA: NEGATIVE mg/dL
Ketones, ur: NEGATIVE mg/dL
Nitrite: NEGATIVE
Protein, ur: 100 mg/dL — AB
RBC / HPF: >50 RBC/hpf — ABNORMAL HIGH (ref 0–5)
Specific Gravity, Urine: 1.009 (ref 1.005–1.030)
pH: 6 (ref 5.0–8.0)

## 2019-03-14 LAB — CBC
HCT: 25.5 % — ABNORMAL LOW (ref 36.0–46.0)
Hemoglobin: 8 g/dL — ABNORMAL LOW (ref 12.0–15.0)
MCH: 30.3 pg (ref 26.0–34.0)
MCHC: 31.4 g/dL (ref 30.0–36.0)
MCV: 96.6 fL (ref 80.0–100.0)
Platelets: 206 10*3/uL (ref 150–400)
RBC: 2.64 MIL/uL — ABNORMAL LOW (ref 3.87–5.11)
RDW: 18.9 % — ABNORMAL HIGH (ref 11.5–15.5)
WBC: 7.5 10*3/uL (ref 4.0–10.5)
nRBC: 0.7 % — ABNORMAL HIGH (ref 0.0–0.2)

## 2019-03-14 LAB — I-STAT BETA HCG BLOOD, ED (MC, WL, AP ONLY): I-stat hCG, quantitative: <5 m[IU]/mL (ref <5)

## 2019-03-14 NOTE — ED Triage Notes (Signed)
Pt was recently admitted and discharged may 19. Upon d/c she had some swelling in the left foot. Swelling is now bilateral. Pt also reports pain.

## 2019-03-14 NOTE — ED Provider Notes (Signed)
[facility] EMERGENCY DEPARTMENT Provider Note   CSN: [id number] Arrival date & time: 03/14/19  1846  History   Chief Complaint Chief Complaint  Patient presents with  . Leg Swelling    HPI Molly [name] is a 33 y.o. female with a hx of tobacco abuse, IVDU, multiple spinal infections including osteomyelitis, discitis, & epidural abscesses, aortic endocarditis, septic emboli, pulmonary HTN, anemia, & hepatitis C who presents to the ED w/ complaints of leg swelling that has been progressively worsening since discharge from the hospital 05/19. Patient states at time of discharge she had some mild swelling to the R foot- this has since progressively worsened w/ development of LLE swelling. Swelling is most prominent to the feet and goes into the calves. She has discomfort associated w/ this which she describes as a tightness. Worse with weightbearing. No alleviating factors. No intervention PTA. Has not been taking vitamin she was discharged home w/ after hospital stay and has not been taking 1 other medicine she was prescribed but is unsure which one suspect metoprolol. Denies numbness, weakness, color change, fever, chills, warmth to the legs, chest pain, shortness of breath, nausea, vomiting, diarrhea, dysuria, injury, or syncope. Denies alcohol or drug use since discharge.   Recent hospital admission 01/20/19-03/06/19 for acute respiratory failure requiring intubation, septic shock, and AKI due to septic emboli w/ MSSA bacteremia-- IV abx for MSSA bacteremia, tricuspid valve endocarditis, and septic emboli. Hospital course complicated by seizure, AKI, anemia and thrombocytopenia.      HPI  Past Medical History:  Diagnosis Date  . 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 Abscess of skin    buttock - most recently 2009/10  . CKD (chronic kidney disease) stage 3, GFR 30-59 ml/min (HCC)   . Drug-seeking behavior   . Hypersplenism   . IV drug user   . MRSA  (methicillin resistant Staphylococcus aureus) infection   . MRSA bacteremia   . Pancytopenia ([place])   . 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 Septic pulmonary embolism ([place])   . Tobacco abuse     Patient Active Problem List   Diagnosis Date Noted  . Tracheostomy status ([place])   . Pressure injury of skin 02/15/2019  . Leukopenia   . Goals of care, counseling/discussion   . Advanced care planning/counseling discussion   . Acute cystitis with hematuria   . DIC (disseminated intravascular coagulation) ([place])   . Multifocal pneumonia   . Acute encephalopathy 01/20/2019  . Thrombocytopenia ([place]) 01/20/2019  . Sepsis with disseminated intravascular coagulopathy (DIC) ([place]) 01/20/2019  . Sepsis ([place]) 01/20/2019  . Pancytopenia, acquired ([place]) 04/22/2018  . 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 CKD (chronic kidney disease) stage 3, GFR 30-59 ml/min (HCC) 04/22/2018  . IV drug user 04/22/2018  . MSSA bacteremia 04/22/2018  . Acute septic pulmonary embolism with acute cor pulmonale (June 2019) 04/22/2018  .  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 Tobacco abuse 04/22/2018  . Noncompliance 04/22/2018  . Abnormal urinalysis 04/22/2018  . 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 Addiction to drug ([place])   . Acute kidney failure ([place])   . Epidural abscess   . Acute bacterial endocarditis   . Chronic thoracic back pain   . Palliative care by specialist   . Sepsis with multiple organ dysfunction (MOD) ([place]) 04/08/2018  . Hyponatremia 04/08/2018  . Eczema 07/12/2017  . Acute upper respiratory infection 11/02/2016  . Hepatitis C antibody test positive 08/24/2016  .  Discitis of lumbosacral region 07/19/2016  . Splenomegaly   . Osteomyelitis of vertebra of thoracolumbar region ([place])   . Polysubstance abuse ([place])   . Encounter for orogastric (OG) tube placement   . Cocaine use 11/28/2015  . Pancytopenia ([place]) 11/27/2015  . AKI (acute kidney injury) ([place])  11/27/2015  . Anasarca 11/27/2015  . Cellulitis of multiple sites of right hand and fingers 07/07/2013  . Acute respiratory failure ([place]) 07/04/2013  . Chronic anemia 07/04/2013  . Pulmonary HTN ([place]) 07/04/2013  . 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 Septic pulmonary embolism ([place]) 07/03/2013  . Intravenous drug abuse, continuous ([place]) 07/03/2013  . MRSA bacteremia 07/03/2013    Past Surgical History:  Procedure Laterality Date  . IR FLUORO GUIDE CV LINE RIGHT  02/05/2019  . IR GENERIC HISTORICAL  07/19/2016   IR LUMBAR DISC ASPIRATION W/IMG GUIDE 07/19/2016 Arne Cleveland, MD [facility]-INTERV RAD  . IR REMOVAL TUN CV CATH W/O FL  03/06/2019  . IR US GUIDE VASC ACCESS RIGHT  02/05/2019  . RADIOLOGY WITH ANESTHESIA N/A 04/11/2018   Procedure: MRI OF LUMBAR AND THORACIC SPINE WITHOUT CONTRAST WITH ANESTHESIA;  Surgeon: Radiologist, Medication, MD;  Location: [facility];  Service: Radiology;  Laterality: N/A;  . TEE WITHOUT CARDIOVERSION N/A 07/04/2013   Procedure: TRANSESOPHAGEAL ECHOCARDIOGRAM (TEE);  Surgeon: Larey Dresser, MD;  Location: [facility] ENDOSCOPY;  Service: Cardiovascular;  Laterality: N/A;  . TONSILLECTOMY     33 years old     OB History   No obstetric history on file.      Home Medications    Prior to Admission medications   Medication Sig Start Date End Date Taking? Authorizing Provider  buprenorphine-naloxone (SUBOXONE) 2-0.5 mg SUBL SL tablet Place 2 tablets under the tongue 2 (two) times a day. 03/07/19   Sid Falcon, MD  clonazePAM (KLONOPIN) 0.5 MG tablet Take 1 tablet (0.5 mg total) by mouth 2 (two) times daily as needed for anxiety. 03/06/19 03/05/20  Mercy Riding, MD  feeding supplement, ENSURE ENLIVE, (ENSURE ENLIVE) LIQD Take 237 mLs by mouth 2 (two) times daily between meals. 03/07/19   Mercy Riding, MD  levETIRAcetam (KEPPRA XR) 500 MG 24 hr tablet Take 1 tablet (500 mg total) by mouth every 12 (twelve) hours. Patient  not taking: Reported on 03/09/2019 03/06/19   Mercy Riding, MD  metoprolol succinate (TOPROL-XL) 50 MG 24 hr tablet Take 1 tablet (50 mg total) by mouth daily. 03/07/19   Mercy Riding, MD  Multiple Vitamin (MULTIVITAMIN WITH MINERALS) TABS tablet Take 1 tablet by mouth daily. 03/07/19   Mercy Riding, MD  pantoprazole (PROTONIX) 40 MG tablet Take 1 tablet (40 mg total) by mouth daily. 03/07/19   Mercy Riding, MD  senna-docusate (SENOKOT-S) 8.6-50 MG tablet Take 1 tablet by mouth at bedtime as needed for mild constipation. Patient not taking: Reported on 07/12/2017 07/21/16   Florinda Marker, MD  vitamin C (VITAMIN C) 250 MG tablet Take 1 tablet (250 mg total) by mouth 2 (two) times daily. 03/06/19   Mercy Riding, MD    Family History Family History  Problem Relation Age of [place]  . Alcoholism Father     Social History Social History   Tobacco Use  . Smoking status: Current Every Day Smoker    Packs/day: 0.50    Years: 7.00    Pack years: 3.50    Types: Cigarettes  . Smokeless tobacco:  Never Used  . Tobacco comment: slowing down  Substance Use Topics  . Alcohol use: No  . Drug use: Yes    Types: IV, Oxycodone    Comment: heroin     Allergies   Lactose intolerance (gi) and Vancomycin   Review of Systems Review of Systems  Constitutional: Negative for chills and fever.  Respiratory: Negative for shortness of breath.   Cardiovascular: Positive for leg swelling. Negative for chest pain and palpitations.  Gastrointestinal: Negative for constipation, diarrhea, nausea and vomiting.  Genitourinary: Negative for dysuria.  Musculoskeletal: Positive for myalgias.  Neurological: Negative for syncope, weakness and numbness.  All other systems reviewed and are negative.    Physical Exam Updated Vital Signs BP (!) 135/107   Pulse (!) 115   Temp 99.2 F (37.3 C) (Oral)   Resp 18   SpO2 98%   Physical Exam Vitals signs and nursing note reviewed.  Constitutional:      General:  She is not in acute distress.    Appearance: She is not toxic-appearing.     Comments: Chronically ill appearing.   HENT:     Head: Normocephalic and atraumatic.  Eyes:     General:        Right eye: No discharge.        Left eye: No discharge.     Conjunctiva/sclera: Conjunctivae normal.  Neck:     Musculoskeletal: Neck supple.  Cardiovascular:     Rate and Rhythm: Regular rhythm. Tachycardia present.     Comments: 2+ symmetric DP/PT pulses.  2+ symmetric radial pulses. Pulmonary:     Effort: Pulmonary effort is normal. No respiratory distress.     Breath sounds: Normal breath sounds. No wheezing, rhonchi or rales.  Abdominal:     General: There is no distension.     Palpations: Abdomen is soft.     Tenderness: There is no abdominal tenderness. There is no guarding or rebound.  Musculoskeletal:     Comments: Back: She has a skin lesion mid back that appears consistent with images in chart at time of discharge from the hospital during recent admission.  No signs of superimposed infection. Lower extremities: Patient has 2-3+ pitting edema which extends to just below the knees.  Edema is symmetric.  There is no overlying erythema or warmth.  No significant open wounds.  Diffuse mild tenderness to areas of edema.  No point/focal bony tenderness.  Compartments are soft.  Skin:    General: Skin is warm and dry.     Findings: No rash.  Neurological:     Mental Status: She is alert.     Comments: Clear speech.  Sensation grossly intact bilateral lower extremities.  5-5 symmetric strength with plantar dorsiflexion bilaterally.  Psychiatric:        Behavior: Behavior normal.    ED Treatments / Results  Labs (all labs ordered are listed, but only abnormal results are displayed) Labs Reviewed  COMPREHENSIVE METABOLIC PANEL - Abnormal; Notable for the following components:      Result Value   Potassium 3.3 (*)    CO2 19 (*)    Glucose, Bld 105 (*)    Creatinine, Ser 1.84 (*)     Calcium 7.5 (*)    Albumin 2.1 (*)    AST 124 (*)    Alkaline Phosphatase 152 (*)    GFR calc non Af Amer 36 (*)    GFR calc Af Amer 42 (*)    All other components within normal  limits  CBC - Abnormal; Notable for the following components:   RBC 2.64 (*)    Hemoglobin 8.0 (*)    HCT 25.5 (*)    RDW 18.9 (*)    nRBC 0.7 (*)    All other components within normal limits  URINALYSIS, ROUTINE W REFLEX MICROSCOPIC - Abnormal; Notable for the following components:   APPearance HAZY (*)    Hgb urine dipstick LARGE (*)    Protein, ur 100 (*)    Leukocytes,Ua TRACE (*)    RBC / HPF >50 (*)    Bacteria, UA RARE (*)    All other components within normal limits  URINE CULTURE  I-STAT BETA HCG BLOOD, ED ([facility], [facility], [facility] ONLY)    EKG EKG Interpretation  Date/Time:  Wednesday Mar 14 2019 20:48:41 EDT Ventricular Rate:  115 PR Interval:    QRS Duration: 85 QT Interval:  325 QTC Calculation: 450 R Axis:   93 Text Interpretation:  Sinus tachycardia Borderline right axis deviation Borderline T wave abnormalities Confirmed by Dene Gentry [phone]) on 03/14/2019 8:52:50 PM   Radiology No results found.  Procedures Procedures (including critical care time)  Medications Ordered in ED Medications - No data to display   Initial Impression / Assessment and Plan / ED Course  I have reviewed the triage vital signs and the nursing notes.  Pertinent labs & imaging results that were available during my care of the patient were reviewed by me and considered in my medical decision making (see chart for details).   Patient presents to the ED for bilateral lower extremity edema which has progressively worsened since hospitalization discharge 05/19. Nontoxic but is chronically ill appearing. Her vitals are notable for tachycardia and mildly elevated BP- this may be secondary to her possibly not taking her metorprolol- unclear definitive etiology- EKG w/ sinus tach. 2-3+ pitting edema noted which is  symmetric- discharge note 05/19 notes 1+ edema bilaterally, given bilateral & overall presentation feel DVT is less likely. Plan for labs & CXR.   CBC: slightly worsened anemia since discharge hgb 8.9-->8.0 today. No leukocytosis.  CMP: MIld hypokalemia & hypocalcemia. Bicarb low. Hypoalbuminemia. Creatinine increased to 1.84 from 1.4 range @ discharge. AST increased @ 124.  Preg test: negative UA: Similar to prior with proteinuria & hematuria.   Overall suspect patient's edema is multifactorial w/ her poor nutrition, hypoalbuminemia, renal function, now w/ AST elevation. No overlying erythema/warmth, fever, or leukocytosis to suggest infectious process such as cellulitis, osteomyelitis, or septic joint. As discussed above doubt DVT.   Recommended admission to the hospital for diuresis w/ careful fluid balance to ensure renal protection as well as to monitor her tachycardia- patient refused.   The patient and I discussed the nature and purpose, risks and benefits, as well as, the alternatives of treatment. Time was given to allow the opportunity to ask questions and consider options. After the discussion, the patient decided to refuse the offerred treatment. The patient was informed that refusal could lead to, but was not limited to, death, permanent disability, or severe pain. If present, I asked the relatives and/or significant others to dissuade the patient without success. Prior to refusing, I determined that the patient had the capacity to make their decision and understood the consequences of that decision. After refusal, I made every reasonable effort to treat them to the best of my ability.  The patient was notified that they may return to the emergency department at any time for further treatment.  I discussed findings & plan of care w/ supervising physician Dr. Francia Greaves who has personally evaluated patient- in agreement w/ plan for admission, we discussed patient refusal and AMA sign out,  given her elevated creatinine would not recommend giving 1 time dose of diuretic in ED or starting her on outpatient diuresis without being able to closely monitor this, recommends elevation & compression, outpatient nephrology information be provided, and informing patient she may return at any time as discussed above. I am in agreement. Plan carried out as discussed. I encouraged her to take all medications as prescribed. I discussed results, treatment plan, need for follow-up, and return precautions with the patient. Provided opportunity for questions, patient confirmed understanding and is in agreement with plan.   Final Clinical Impressions(s) / ED Diagnoses   Final diagnoses:  Bilateral lower extremity edema    ED Discharge Orders    None       Leafy Kindle 03/14/19 2233    Valarie Merino, MD 03/20/19 1452

## 2019-03-14 NOTE — Discharge Instructions (Signed)
You are seen in the emergency department today for lower extremity swelling.  Your lab work showed that you have an elevation in your kidney function, 1 of your liver function test was elevated, and your albumin is low.  Suspect each of the above is contributing to your swelling.  As discussed we recommended admitting you to the hospital, if you wish to return and be admitted please come back to the emergency department for evaluation at any time.  In the meantime we would like you to try using compression stockings and keeping your legs elevated as best possible.  We would like you to follow-up with a nephrologist, a kidney specialist, within the next 48 hours, please call Dr. Deterding's office tomorrow morning, also follow-up with your primary care provider.  Your hemoglobin was also a bit low this will need to be rechecked and your heart rate is elevated this will need to be rechecked.  Again return to the emergency department at any time for reevaluation, additionally please return immediately for new or worsening symptoms or any other concerns.

## 2019-03-14 NOTE — ED Notes (Signed)
Pt elected to leave AMA. Paperwork reviewed. Follow up discussed. Pt signed out AMA and taken to the lobby to wait for her ride via wheelchair.

## 2019-03-15 ENCOUNTER — Telehealth: Payer: Self-pay | Admitting: Nurse Practitioner

## 2019-03-15 NOTE — Telephone Encounter (Signed)
New Message   Pt is requesting a call back but does not want to disclose the reason. Please f/u

## 2019-03-15 NOTE — Telephone Encounter (Signed)
Please call back patient. She did not establish care with PCP Geryl Rankins. Patient need to establish care with one of the PCP here if she want to continue care.

## 2019-03-16 LAB — URINE CULTURE: Culture: <10000 — AB

## 2019-03-21 ENCOUNTER — Encounter: Payer: Self-pay | Admitting: Infectious Disease

## 2019-03-21 ENCOUNTER — Other Ambulatory Visit: Payer: Self-pay

## 2019-03-21 ENCOUNTER — Ambulatory Visit (INDEPENDENT_AMBULATORY_CARE_PROVIDER_SITE_OTHER): Payer: Self-pay | Admitting: Infectious Disease

## 2019-03-21 DIAGNOSIS — I2601 Septic pulmonary embolism with acute cor pulmonale: Secondary | ICD-10-CM

## 2019-03-21 DIAGNOSIS — N183 Chronic kidney disease, stage 3 unspecified: Secondary | ICD-10-CM

## 2019-03-21 DIAGNOSIS — G062 Extradural and subdural abscess, unspecified: Secondary | ICD-10-CM

## 2019-03-21 DIAGNOSIS — N179 Acute kidney failure, unspecified: Secondary | ICD-10-CM

## 2019-03-21 DIAGNOSIS — R600 Localized edema: Secondary | ICD-10-CM | POA: Insufficient documentation

## 2019-03-21 DIAGNOSIS — M4625 Osteomyelitis of vertebra, thoracolumbar region: Secondary | ICD-10-CM

## 2019-03-21 DIAGNOSIS — G061 Intraspinal abscess and granuloma: Secondary | ICD-10-CM

## 2019-03-21 DIAGNOSIS — R7881 Bacteremia: Secondary | ICD-10-CM

## 2019-03-21 DIAGNOSIS — B958 Unspecified staphylococcus as the cause of diseases classified elsewhere: Secondary | ICD-10-CM

## 2019-03-21 DIAGNOSIS — F1910 Other psychoactive substance abuse, uncomplicated: Secondary | ICD-10-CM

## 2019-03-21 DIAGNOSIS — I330 Acute and subacute infective endocarditis: Secondary | ICD-10-CM

## 2019-03-21 HISTORY — DX: Localized edema: R60.0

## 2019-03-21 NOTE — Progress Notes (Signed)
Virtual Visit via Telephone Note  I connected with Molly [name] on 03/21/19 at 10:45 AM EDT by telephone and verified that I am speaking with the correct person using two identifiers.  Location: Patient: Home Provider: [facility]   I discussed the limitations, risks, security and privacy concerns of performing an evaluation and management service by telephone and the availability of in person appointments. I also discussed with the patient that there may be a patient responsible charge related to this service. The patient expressed understanding and agreed to proceed.   History of Present Illness:   Molly [name] is a 33 y.o. female with  with history of IVDU and MRSA TV endocarditis, epidural abscess not fully treated in July 2019 due to patient leaving AMA who presented 4/4 with MSSA bacteremia, acute kidney failure, DIC, septic shock requiring pressors, and worsened TV regurgitation.  During this last hospitalization she was so critically ill with severe renal failure that withdrawal of care and pure comfort care was considered.  She was not placed on hemodialysis which was felt to not be appropriate for her in her condition that she was at the time.  She did however recover and come off the ventilator and recover much of her kidney function.  She finished a course of cefazolin while in the hospital and was ultimately discharged in May.  Since discharge she has been complaining of bilateral lower extremity edema as well as facial edema.  She came to the emergency department last week for evaluation of these problems.  They offer her admission so that she could be diuresed..  She refused admission.  Note her labs did show worsening of her kidney function from when she had left the hospital with a bump in her creatinine to 1.84 from a value of 1.48 on the 19th.  She is apparently going to see a nephrologist today at 3:30 PM.  In addition to the complaints about facial swelling and lower  extremity edema she has had severe back pain.  Of note she did have a prior epidural abscess in her back was not imaged during her hospitalization.  I do not of them is aware of this back pain.  Certainly when I saw her she was critically ill and on the ventilator as I mentioned potentially headed towards the end of her life.  Her mother who got on the phone with the patient's permission states that the patient had "pus coming out of her back at some point during this illness.  I am going to arrange for an MRI of her lower spine to reassess this region.  With regards to her edema I expect this is indeed multifactorial as the ER physicians noted in the potentially be due to her kidney injury, protein malnutrition and potentially heart function.  Certainly also other things such as thyroid dysfunction or other endocrinologic trickle causes could be considered.    She is currently without fevers chills nausea malaise.  She is not in a substance treatment program or at least is not taking the Suboxone that she was prescribed.  She states that this will prevent her from receiving medications for her back pain.  Her mother and the patient claimed that she is abstaining from drugs because she does not want to use them anymore.  They claim that she is staying away from people who would put her at risk of using drugs.      Observations/Objective:  Recurrent methicillin sensitive staph aureus bacteremia with tricuspid valve endocarditis with  severe tricuspid valve regurgitation and large vegetation  Epidural abscess seen last summer with now back pain again but without imaging during this last hospitalization  Acute kidney injury due to her sepsis and DIC felt not to be a dialysis candidate but who ultimately recovered now with worsening renal function yet again and facial and lower extremity edema  Facial and lower extremity edema  Intravenous drug use   Assessment and Plan:  Recurrent  methicillin sensitive staph aureus bacteremia with tricuspid valve endocarditis severe tricuspid valve regurgitation and large vegetation: She is not an operative candidate but she did receive sufficient antimicrobial therapy the concern now is whether she might have a discitis that is still present or epidural abscess  History of epidural abscess now with back pain that was not evaluated I will get an MRI  without contrast, I am also scheduling her to see me next week in person next Wednesday.  Acute on chronic kidney disease: Seeing nephrology today and perhaps she can be diuresed to help with her facial extremity edema  Facial large from edema again this could be due to chronic kidney disease or acute on chronic kidney disease she has been taking some ibuprofen which have asked her to stop taking.  Could be other entities as described above.  Intravenous drug use have encouraged her to get into a substance abuse program regardless whether she feels that Suboxone is a right opiate replacement therapy for her.    Follow Up Instructions:    I discussed the assessment and treatment plan with the patient. The patient was provided an opportunity to ask questions and all were answered. The patient agreed with the plan and demonstrated an understanding of the instructions.   The patient was advised to call back or seek an in-person evaluation if the symptoms worsen or if the condition fails to improve as anticipated.  I provided 30 minutes of non-face-to-face time during this encounter.   Alcide Evener, MD

## 2019-03-27 ENCOUNTER — Telehealth: Payer: Self-pay | Admitting: *Deleted

## 2019-03-27 NOTE — Telephone Encounter (Signed)
Dr Hollie Salk at [facility] faxed urine culture results to Dr Tommy Medal asking for antibiotic advice for resistant e coli in setting of history of endocarditis, epidural abscesses, iv drug use.  Per Dr Tommy Medal, he recommends fosfomycin 3 gm by mouth one time only IF patient is symptomatic. RN called [facility], left message for Dr Bishop Dublin CMA Cathie Beams with the recommendation.   Landis Gandy, RN

## 2019-03-28 ENCOUNTER — Ambulatory Visit (HOSPITAL_COMMUNITY): Admission: RE | Admit: 2019-03-28 | Payer: Self-pay | Source: Ambulatory Visit

## 2019-03-28 NOTE — Telephone Encounter (Signed)
Received a call from [facility] regarding message from RN. Office calling to confirm medication and directions. Informed CMA that Dr. Tommy Medal recommended fosfomycin 3 gm by mouth one time only IF patient is symptomatic. CMA confirmed spelling and directions of medication before ending call. [place]

## 2019-04-02 ENCOUNTER — Other Ambulatory Visit: Payer: Self-pay | Admitting: Infectious Disease

## 2019-04-03 ENCOUNTER — Other Ambulatory Visit: Payer: Self-pay | Admitting: Infectious Disease

## 2019-04-03 DIAGNOSIS — R7881 Bacteremia: Secondary | ICD-10-CM

## 2019-04-04 ENCOUNTER — Other Ambulatory Visit: Payer: Self-pay

## 2019-04-04 ENCOUNTER — Ambulatory Visit (HOSPITAL_COMMUNITY)
Admission: RE | Admit: 2019-04-04 | Discharge: 2019-04-04 | Disposition: A | Discharge status: Home / Self Care | Payer: Medicaid Other | Source: Ambulatory Visit | Attending: Infectious Disease | Admitting: Infectious Disease

## 2019-04-04 ENCOUNTER — Encounter (HOSPITAL_COMMUNITY): Payer: Self-pay | Admitting: Radiology

## 2019-04-04 DIAGNOSIS — R7881 Bacteremia: Secondary | ICD-10-CM | POA: Diagnosis not present

## 2019-04-16 ENCOUNTER — Telehealth: Payer: Self-pay | Admitting: *Deleted

## 2019-04-16 ENCOUNTER — Other Ambulatory Visit: Payer: Self-pay

## 2019-04-16 ENCOUNTER — Inpatient Hospital Stay (HOSPITAL_COMMUNITY)
Admission: EM | Admit: 2019-04-16 | Discharge: 2019-05-19 | DRG: 871 | Disposition: E | Payer: Medicaid Other | Attending: Family Medicine | Admitting: Family Medicine

## 2019-04-16 ENCOUNTER — Emergency Department (HOSPITAL_COMMUNITY): Payer: Medicaid Other

## 2019-04-16 ENCOUNTER — Encounter (HOSPITAL_COMMUNITY): Payer: Self-pay | Admitting: Emergency Medicine

## 2019-04-16 DIAGNOSIS — G894 Chronic pain syndrome: Secondary | ICD-10-CM | POA: Diagnosis present

## 2019-04-16 DIAGNOSIS — F17210 Nicotine dependence, cigarettes, uncomplicated: Secondary | ICD-10-CM | POA: Diagnosis not present

## 2019-04-16 DIAGNOSIS — Z7189 Other specified counseling: Secondary | ICD-10-CM | POA: Diagnosis not present

## 2019-04-16 DIAGNOSIS — I5021 Acute systolic (congestive) heart failure: Secondary | ICD-10-CM | POA: Diagnosis not present

## 2019-04-16 DIAGNOSIS — Z9119 Patient's noncompliance with other medical treatment and regimen: Secondary | ICD-10-CM

## 2019-04-16 DIAGNOSIS — J189 Pneumonia, unspecified organism: Secondary | ICD-10-CM | POA: Diagnosis present

## 2019-04-16 DIAGNOSIS — Z8614 Personal history of Methicillin resistant Staphylococcus aureus infection: Secondary | ICD-10-CM | POA: Diagnosis not present

## 2019-04-16 DIAGNOSIS — K7200 Acute and subacute hepatic failure without coma: Secondary | ICD-10-CM

## 2019-04-16 DIAGNOSIS — R161 Splenomegaly, not elsewhere classified: Secondary | ICD-10-CM | POA: Diagnosis present

## 2019-04-16 DIAGNOSIS — N183 Chronic kidney disease, stage 3 (moderate): Secondary | ICD-10-CM | POA: Diagnosis present

## 2019-04-16 DIAGNOSIS — I361 Nonrheumatic tricuspid (valve) insufficiency: Secondary | ICD-10-CM | POA: Diagnosis not present

## 2019-04-16 DIAGNOSIS — Z8619 Personal history of other infectious and parasitic diseases: Secondary | ICD-10-CM | POA: Diagnosis not present

## 2019-04-16 DIAGNOSIS — I38 Endocarditis, valve unspecified: Secondary | ICD-10-CM | POA: Diagnosis not present

## 2019-04-16 DIAGNOSIS — E876 Hypokalemia: Secondary | ICD-10-CM | POA: Diagnosis present

## 2019-04-16 DIAGNOSIS — Z515 Encounter for palliative care: Secondary | ICD-10-CM | POA: Diagnosis not present

## 2019-04-16 DIAGNOSIS — Z66 Do not resuscitate: Secondary | ICD-10-CM | POA: Diagnosis not present

## 2019-04-16 DIAGNOSIS — A4153 Sepsis due to Serratia: Secondary | ICD-10-CM | POA: Diagnosis present

## 2019-04-16 DIAGNOSIS — R6521 Severe sepsis with septic shock: Secondary | ICD-10-CM | POA: Diagnosis not present

## 2019-04-16 DIAGNOSIS — F1110 Opioid abuse, uncomplicated: Secondary | ICD-10-CM | POA: Diagnosis present

## 2019-04-16 DIAGNOSIS — F1190 Opioid use, unspecified, uncomplicated: Secondary | ICD-10-CM | POA: Diagnosis not present

## 2019-04-16 DIAGNOSIS — R601 Generalized edema: Secondary | ICD-10-CM | POA: Diagnosis not present

## 2019-04-16 DIAGNOSIS — Z881 Allergy status to other antibiotic agents status: Secondary | ICD-10-CM

## 2019-04-16 DIAGNOSIS — Z6820 Body mass index (BMI) 20.0-20.9, adult: Secondary | ICD-10-CM

## 2019-04-16 DIAGNOSIS — E871 Hypo-osmolality and hyponatremia: Secondary | ICD-10-CM | POA: Diagnosis present

## 2019-04-16 DIAGNOSIS — O85 Puerperal sepsis: Secondary | ICD-10-CM

## 2019-04-16 DIAGNOSIS — R7881 Bacteremia: Secondary | ICD-10-CM | POA: Diagnosis not present

## 2019-04-16 DIAGNOSIS — N390 Urinary tract infection, site not specified: Secondary | ICD-10-CM | POA: Diagnosis not present

## 2019-04-16 DIAGNOSIS — I079 Rheumatic tricuspid valve disease, unspecified: Secondary | ICD-10-CM | POA: Insufficient documentation

## 2019-04-16 DIAGNOSIS — R569 Unspecified convulsions: Secondary | ICD-10-CM

## 2019-04-16 DIAGNOSIS — A4102 Sepsis due to Methicillin resistant Staphylococcus aureus: Secondary | ICD-10-CM | POA: Diagnosis present

## 2019-04-16 DIAGNOSIS — J9601 Acute respiratory failure with hypoxia: Secondary | ICD-10-CM | POA: Diagnosis not present

## 2019-04-16 DIAGNOSIS — I2690 Septic pulmonary embolism without acute cor pulmonale: Secondary | ICD-10-CM | POA: Diagnosis present

## 2019-04-16 DIAGNOSIS — R011 Cardiac murmur, unspecified: Secondary | ICD-10-CM | POA: Diagnosis not present

## 2019-04-16 DIAGNOSIS — N182 Chronic kidney disease, stage 2 (mild): Secondary | ICD-10-CM | POA: Diagnosis not present

## 2019-04-16 DIAGNOSIS — K759 Inflammatory liver disease, unspecified: Secondary | ICD-10-CM | POA: Diagnosis not present

## 2019-04-16 DIAGNOSIS — E46 Unspecified protein-calorie malnutrition: Secondary | ICD-10-CM | POA: Diagnosis present

## 2019-04-16 DIAGNOSIS — B9620 Unspecified Escherichia coli [E. coli] as the cause of diseases classified elsewhere: Secondary | ICD-10-CM | POA: Diagnosis not present

## 2019-04-16 DIAGNOSIS — D649 Anemia, unspecified: Secondary | ICD-10-CM | POA: Diagnosis not present

## 2019-04-16 DIAGNOSIS — L89152 Pressure ulcer of sacral region, stage 2: Secondary | ICD-10-CM | POA: Diagnosis present

## 2019-04-16 DIAGNOSIS — R918 Other nonspecific abnormal finding of lung field: Secondary | ICD-10-CM | POA: Diagnosis not present

## 2019-04-16 DIAGNOSIS — F1910 Other psychoactive substance abuse, uncomplicated: Secondary | ICD-10-CM | POA: Diagnosis not present

## 2019-04-16 DIAGNOSIS — D65 Disseminated intravascular coagulation [defibrination syndrome]: Secondary | ICD-10-CM | POA: Diagnosis present

## 2019-04-16 DIAGNOSIS — E739 Lactose intolerance, unspecified: Secondary | ICD-10-CM | POA: Diagnosis present

## 2019-04-16 DIAGNOSIS — N179 Acute kidney failure, unspecified: Secondary | ICD-10-CM

## 2019-04-16 DIAGNOSIS — I071 Rheumatic tricuspid insufficiency: Secondary | ICD-10-CM | POA: Diagnosis present

## 2019-04-16 DIAGNOSIS — N189 Chronic kidney disease, unspecified: Secondary | ICD-10-CM

## 2019-04-16 DIAGNOSIS — Z20828 Contact with and (suspected) exposure to other viral communicable diseases: Secondary | ICD-10-CM | POA: Diagnosis present

## 2019-04-16 DIAGNOSIS — Z79899 Other long term (current) drug therapy: Secondary | ICD-10-CM

## 2019-04-16 DIAGNOSIS — D631 Anemia in chronic kidney disease: Secondary | ICD-10-CM | POA: Diagnosis present

## 2019-04-16 DIAGNOSIS — A419 Sepsis, unspecified organism: Secondary | ICD-10-CM | POA: Diagnosis not present

## 2019-04-16 DIAGNOSIS — R6520 Severe sepsis without septic shock: Secondary | ICD-10-CM | POA: Diagnosis present

## 2019-04-16 DIAGNOSIS — I5041 Acute combined systolic (congestive) and diastolic (congestive) heart failure: Secondary | ICD-10-CM | POA: Diagnosis not present

## 2019-04-16 DIAGNOSIS — M7989 Other specified soft tissue disorders: Secondary | ICD-10-CM | POA: Diagnosis not present

## 2019-04-16 DIAGNOSIS — D696 Thrombocytopenia, unspecified: Secondary | ICD-10-CM

## 2019-04-16 DIAGNOSIS — F1990 Other psychoactive substance use, unspecified, uncomplicated: Secondary | ICD-10-CM | POA: Diagnosis not present

## 2019-04-16 DIAGNOSIS — R0603 Acute respiratory distress: Secondary | ICD-10-CM | POA: Diagnosis not present

## 2019-04-16 DIAGNOSIS — R6510 Systemic inflammatory response syndrome (SIRS) of non-infectious origin without acute organ dysfunction: Secondary | ICD-10-CM

## 2019-04-16 DIAGNOSIS — I368 Other nonrheumatic tricuspid valve disorders: Secondary | ICD-10-CM | POA: Diagnosis not present

## 2019-04-16 DIAGNOSIS — B9689 Other specified bacterial agents as the cause of diseases classified elsewhere: Secondary | ICD-10-CM | POA: Diagnosis not present

## 2019-04-16 DIAGNOSIS — B9562 Methicillin resistant Staphylococcus aureus infection as the cause of diseases classified elsewhere: Secondary | ICD-10-CM | POA: Diagnosis not present

## 2019-04-16 HISTORY — DX: Encounter for palliative care: Z51.5

## 2019-04-16 HISTORY — DX: Acute cystitis with hematuria: N30.01

## 2019-04-16 HISTORY — DX: Acute kidney failure, unspecified: N17.9

## 2019-04-16 HISTORY — DX: Sepsis, unspecified organism: R65.20

## 2019-04-16 HISTORY — DX: Splenomegaly, not elsewhere classified: R16.1

## 2019-04-16 HISTORY — DX: Encounter for fitting and adjustment of other gastrointestinal appliance and device: Z46.59

## 2019-04-16 HISTORY — DX: Pneumonia, unspecified organism: J18.9

## 2019-04-16 HISTORY — DX: Sepsis, unspecified organism: A41.9

## 2019-04-16 HISTORY — DX: Decreased white blood cell count, unspecified: D72.819

## 2019-04-16 HISTORY — DX: Disseminated intravascular coagulation (defibrination syndrome): D65

## 2019-04-16 LAB — CBC WITH DIFFERENTIAL/PLATELET
Abs Immature Granulocytes: 0.63 10*3/uL — ABNORMAL HIGH (ref 0.00–0.07)
Basophils Absolute: 0 10*3/uL (ref 0.0–0.1)
Basophils Relative: 0 %
Eosinophils Absolute: 0 10*3/uL (ref 0.0–0.5)
Eosinophils Relative: 0 %
HCT: 27.2 % — ABNORMAL LOW (ref 36.0–46.0)
Hemoglobin: 8.6 g/dL — ABNORMAL LOW (ref 12.0–15.0)
Immature Granulocytes: 13 %
Lymphocytes Relative: 5 %
Lymphs Abs: 0.2 10*3/uL — ABNORMAL LOW (ref 0.7–4.0)
MCH: 29.6 pg (ref 26.0–34.0)
MCHC: 31.6 g/dL (ref 30.0–36.0)
MCV: 93.5 fL (ref 80.0–100.0)
Monocytes Absolute: 0.1 10*3/uL (ref 0.1–1.0)
Monocytes Relative: 2 %
Neutro Abs: 3.9 10*3/uL (ref 1.7–7.7)
Neutrophils Relative %: 80 %
Platelets: 8 10*3/uL — CL (ref 150–400)
RBC: 2.91 MIL/uL — ABNORMAL LOW (ref 3.87–5.11)
RDW: 17.1 % — ABNORMAL HIGH (ref 11.5–15.5)
WBC: 4.8 10*3/uL (ref 4.0–10.5)
nRBC: 0 % (ref 0.0–0.2)

## 2019-04-16 LAB — TYPE AND SCREEN
ABO/RH(D): O NEG
Antibody Screen: NEGATIVE

## 2019-04-16 LAB — URINALYSIS, ROUTINE W REFLEX MICROSCOPIC
Bilirubin Urine: NEGATIVE
Glucose, UA: NEGATIVE mg/dL
Ketones, ur: NEGATIVE mg/dL
Nitrite: NEGATIVE
Protein, ur: 100 mg/dL — AB
RBC / HPF: >50 RBC/hpf — ABNORMAL HIGH (ref 0–5)
Specific Gravity, Urine: 1.012 (ref 1.005–1.030)
pH: 6 (ref 5.0–8.0)

## 2019-04-16 LAB — I-STAT CHEM 8, ED
BUN: 71 mg/dL — ABNORMAL HIGH (ref 6–20)
Calcium, Ion: 0.78 mmol/L — CL (ref 1.15–1.40)
Chloride: 101 mmol/L (ref 98–111)
Creatinine, Ser: 2.2 mg/dL — ABNORMAL HIGH (ref 0.44–1.00)
Glucose, Bld: 73 mg/dL (ref 70–99)
HCT: 27 % — ABNORMAL LOW (ref 36.0–46.0)
Hemoglobin: 9.2 g/dL — ABNORMAL LOW (ref 12.0–15.0)
Potassium: 6.4 mmol/L (ref 3.5–5.1)
Sodium: 125 mmol/L — ABNORMAL LOW (ref 135–145)
TCO2: 17 mmol/L — ABNORMAL LOW (ref 22–32)

## 2019-04-16 LAB — COMPREHENSIVE METABOLIC PANEL
ALT: 8 U/L (ref 0–44)
AST: 21 U/L (ref 15–41)
Albumin: 1.6 g/dL — ABNORMAL LOW (ref 3.5–5.0)
Alkaline Phosphatase: 80 U/L (ref 38–126)
Anion gap: 14 (ref 5–15)
BUN: 53 mg/dL — ABNORMAL HIGH (ref 6–20)
CO2: 16 mmol/L — ABNORMAL LOW (ref 22–32)
Calcium: 7.3 mg/dL — ABNORMAL LOW (ref 8.9–10.3)
Chloride: 97 mmol/L — ABNORMAL LOW (ref 98–111)
Creatinine, Ser: 2.39 mg/dL — ABNORMAL HIGH (ref 0.44–1.00)
GFR calc Af Amer: 30 mL/min — ABNORMAL LOW (ref >60)
GFR calc non Af Amer: 26 mL/min — ABNORMAL LOW (ref >60)
Glucose, Bld: 79 mg/dL (ref 70–99)
Potassium: 2.8 mmol/L — ABNORMAL LOW (ref 3.5–5.1)
Sodium: 127 mmol/L — ABNORMAL LOW (ref 135–145)
Total Bilirubin: 5.4 mg/dL — ABNORMAL HIGH (ref 0.3–1.2)
Total Protein: 5.2 g/dL — ABNORMAL LOW (ref 6.5–8.1)

## 2019-04-16 LAB — LACTIC ACID, PLASMA: Lactic Acid, Venous: 2.5 mmol/L (ref 0.5–1.9)

## 2019-04-16 LAB — PROTIME-INR
INR: 1.8 — ABNORMAL HIGH (ref 0.8–1.2)
Prothrombin Time: 20.7 seconds — ABNORMAL HIGH (ref 11.4–15.2)

## 2019-04-16 LAB — I-STAT BETA HCG BLOOD, ED (MC, WL, AP ONLY): I-stat hCG, quantitative: 8.5 m[IU]/mL — ABNORMAL HIGH (ref <5)

## 2019-04-16 LAB — SARS CORONAVIRUS 2 BY RT PCR (HOSPITAL ORDER, PERFORMED IN ~~LOC~~ HOSPITAL LAB): SARS Coronavirus 2: NEGATIVE

## 2019-04-16 MED ORDER — FENTANYL CITRATE (PF) 100 MCG/2ML IJ SOLN
25.0000 ug | INTRAMUSCULAR | Status: DC | PRN
  Administered 2019-04-17 – 2019-04-19 (×16): 50 ug via INTRAVENOUS
  Filled 2019-04-16 (×17): qty 2

## 2019-04-16 MED ORDER — SODIUM CHLORIDE 0.9 % IV SOLN
1.0000 g | INTRAVENOUS | Status: DC
  Administered 2019-04-16 – 2019-04-18 (×3): 1 g via INTRAVENOUS
  Filled 2019-04-16 (×4): qty 1

## 2019-04-16 MED ORDER — POTASSIUM CHLORIDE 10 MEQ/100ML IV SOLN
10.0000 meq | INTRAVENOUS | Status: AC
  Administered 2019-04-16 – 2019-04-17 (×4): 10 meq via INTRAVENOUS
  Filled 2019-04-16 (×4): qty 100

## 2019-04-16 MED ORDER — POTASSIUM CHLORIDE CRYS ER 20 MEQ PO TBCR
20.0000 meq | EXTENDED_RELEASE_TABLET | Freq: Once | ORAL | Status: AC
  Administered 2019-04-17: 20 meq via ORAL
  Filled 2019-04-16: qty 1

## 2019-04-16 MED ORDER — SODIUM CHLORIDE 0.9% IV SOLUTION
Freq: Once | INTRAVENOUS | Status: AC
  Administered 2019-04-18: 22:00:00 via INTRAVENOUS

## 2019-04-16 MED ORDER — FUROSEMIDE 10 MG/ML IJ SOLN
40.0000 mg | Freq: Two times a day (BID) | INTRAMUSCULAR | Status: DC
  Administered 2019-04-17 (×2): 40 mg via INTRAVENOUS
  Filled 2019-04-16 (×2): qty 4

## 2019-04-16 MED ORDER — HYDROMORPHONE HCL 1 MG/ML IJ SOLN
1.0000 mg | Freq: Once | INTRAMUSCULAR | Status: AC
  Administered 2019-04-16: 1 mg via INTRAVENOUS
  Filled 2019-04-16: qty 1

## 2019-04-16 MED ORDER — SODIUM CHLORIDE 0.9 % IV BOLUS
1000.0000 mL | Freq: Once | INTRAVENOUS | Status: AC
  Administered 2019-04-16: 1000 mL via INTRAVENOUS

## 2019-04-16 MED ORDER — LINEZOLID 600 MG/300ML IV SOLN
600.0000 mg | Freq: Once | INTRAVENOUS | Status: AC
  Administered 2019-04-16: 600 mg via INTRAVENOUS
  Filled 2019-04-16: qty 300

## 2019-04-16 NOTE — ED Notes (Signed)
Attempted report 

## 2019-04-16 NOTE — ED Notes (Signed)
Attempted IV access on pt. Pt is a difficult IV stick. IV team consulted

## 2019-04-16 NOTE — Telephone Encounter (Signed)
That sounds totally reasonable

## 2019-04-16 NOTE — H&P (Signed)
History and Physical    Molly [name] ZOX:[id number] DOB: [birth year] DOA: 03/19/2019  PCP: Patient, No Pcp Per   Patient coming from: Home   Chief Complaint: Aches, swelling, SOB   HPI: Molly [name] is a 33 y.o. 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 She saw ID in the clinic earlier this month, was complaining of increasing back pain, and had MRI performed without any acute discitis-osteomyelitis.  She denies any bleeding, denies any recent substance use, and denies any vomiting.  ED Course: Upon arrival to the ED, patient is found to be afebrile, saturating low 90s on room air, tachypneic, tachycardic, and with SBP in the 90s.  EKG demonstrates sinus tachycardia with rate 128 and chest x-ray is concerning for multifocal airspace opacities bilaterally, suggestive of multifocal pneumonia, as well as cardiomegaly with generalized volume overload.  Chemistry panel features a sodium of 127, potassium 2.8, bicarbonate 16, BUN 53, creatinine 2.39, albumin 1.6, and bilirubin 5.4.  CBC demonstrates a stable normocytic anemia with hemoglobin 8.6 and a new thrombocytopenia with platelets 8000.  Lactic acid is elevated to 2.5 and INR elevated to 1.8.  Blood cultures were collected, type and screen performed, and the patient was treated with a liter of normal saline, cefepime, linezolid, and IV potassium.  Hospitalists are asked to admit.  Review of Systems:  All other systems reviewed and apart from HPI, are negative.  Past Medical History:  Diagnosis Date  . 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 Abscess of skin    buttock - most recently 2009/10  . CKD (chronic kidney disease) stage 3, GFR  30-59 ml/min (HCC)   . Drug-seeking behavior   . Facial edema 03/21/2019  . Hypersplenism   . IV drug user   . Leg edema 03/21/2019  . MRSA (methicillin resistant Staphylococcus aureus) infection   . MRSA bacteremia   . Pancytopenia ([place])   . 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 Septic pulmonary embolism ([place])   . Tobacco abuse     Past Surgical History:  Procedure Laterality Date  . IR FLUORO GUIDE CV LINE RIGHT  02/05/2019  . IR GENERIC HISTORICAL  07/19/2016   IR LUMBAR DISC ASPIRATION W/IMG GUIDE 07/19/2016 Arne Cleveland, MD [facility]-INTERV RAD  . IR REMOVAL TUN CV CATH W/O FL  03/06/2019  . IR US GUIDE VASC ACCESS RIGHT  02/05/2019  . RADIOLOGY WITH ANESTHESIA N/A 04/11/2018   Procedure: MRI OF LUMBAR AND THORACIC SPINE WITHOUT CONTRAST WITH ANESTHESIA;  Surgeon: Radiologist, Medication, MD;  Location: [facility] [facility];  Service: Radiology;  Laterality: N/A;  . TEE WITHOUT CARDIOVERSION N/A 07/04/2013   Procedure: TRANSESOPHAGEAL ECHOCARDIOGRAM (TEE);  Surgeon: Larey Dresser, MD;  Location: [facility] ENDOSCOPY;  Service: Cardiovascular;  Laterality: N/A;  . TONSILLECTOMY     33 years old     reports that she has been smoking cigarettes. She has a 3.50 pack-year smoking history. She has never used smokeless tobacco. She reports current drug use. Drugs: IV and Oxycodone. She reports that she does not drink alcohol.  Allergies  Allergen Reactions  . Lactose Intolerance (Gi)   . Vancomycin Other (See Comments)    Possible contributor to AKI and thrombocytopenia    Family History  Problem  Relation Age of Onset  . Alcoholism Father      Prior to Admission medications   Medication Sig Start Date End Date Taking? Authorizing Provider  buprenorphine-naloxone (SUBOXONE) 2-0.5 mg SUBL SL tablet Place 2 tablets under the tongue 2 (two) times a day. 03/07/19   Sid Falcon, MD  clonazePAM (KLONOPIN) 0.5 MG tablet Take 1 tablet (0.5 mg total) by mouth 2 (two) times daily as needed for anxiety.  03/06/19 03/05/20  Mercy Riding, MD  feeding supplement, ENSURE ENLIVE, (ENSURE ENLIVE) LIQD Take 237 mLs by mouth 2 (two) times daily between meals. Patient not taking: Reported on 03/21/2019 03/07/19   Mercy Riding, MD  levETIRAcetam (KEPPRA XR) 500 MG 24 hr tablet Take 1 tablet (500 mg total) by mouth every 12 (twelve) hours. 03/06/19   Mercy Riding, MD  metoprolol succinate (TOPROL-XL) 50 MG 24 hr tablet Take 1 tablet (50 mg total) by mouth daily. Patient not taking: Reported on 03/21/2019 03/07/19   Mercy Riding, MD  Multiple Vitamin (MULTIVITAMIN WITH MINERALS) TABS tablet Take 1 tablet by mouth daily. 03/07/19   Mercy Riding, MD  pantoprazole (PROTONIX) 40 MG tablet Take 1 tablet (40 mg total) by mouth daily. Patient not taking: Reported on 03/21/2019 03/07/19   Mercy Riding, MD  senna-docusate (SENOKOT-S) 8.6-50 MG tablet Take 1 tablet by mouth at bedtime as needed for mild constipation. Patient not taking: Reported on 07/12/2017 07/21/16   Florinda Marker, MD  vitamin C (VITAMIN C) 250 MG tablet Take 1 tablet (250 mg total) by mouth 2 (two) times daily. Patient not taking: Reported on 03/21/2019 03/06/19   Mercy Riding, MD    Physical Exam: Vitals:   04/01/2019 2130 03/23/2019 2145 03/31/2019 2200 03/28/2019 2230  BP: 1'02/77 94/76 97/75 ' 94/74  Pulse: (!) 122 (!) 119 (!) 118 (!) 119  Resp: (!) 52 (!) 53 (!) 56 (!) 62  Temp:      TempSrc:      SpO2: 92% 92% 97% 97%  Weight:      Height:        Constitutional: Tachypneic, jaundice, appears uncomfortable  Eyes: PERTLA, lids and conjunctivae normal ENMT: Mucous membranes are moist. Posterior pharynx clear of any exudate or lesions.   Neck: normal, supple, no masses, no thyromegaly Respiratory:Tachypnea. Scattered rhonchi bilaterally. Increased WOB.   Cardiovascular: Rate ~120 and regular. Pitting edema to bilateral LE's. Abdomen: No distension, no tenderness, no masses palpated. Bowel sounds normal.  Musculoskeletal: no clubbing / cyanosis. No  joint deformity upper and lower extremities.    Skin: no significant rashes, lesions, ulcers. Jaundiced. Neurologic: CN 2-12 grossly intact. Sensation to light touch. Moving all extremities.  Psychiatric: Alert and oriented to person, place, and situation. Calm, cooperative.     Labs on Admission: I have personally reviewed following labs and imaging studies  CBC: Recent Labs  Lab 04/17/2019 2039 03/25/2019 2053  WBC  --  4.8  NEUTROABS  --  3.9  HGB 9.2* 8.6*  HCT 27.0* 27.2*  MCV  --  93.5  PLT  --  8*   Basic Metabolic Panel: Recent Labs  Lab 04/02/2019 2039 04/10/2019 2053  NA 125* 127*  K 6.4* 2.8*  CL 101 97*  CO2  --  16*  GLUCOSE 73 79  BUN 71* 53*  CREATININE 2.20* 2.39*  CALCIUM  --  7.3*   GFR: Estimated Creatinine Clearance: 25.7 mL/min (A) (by C-G formula based on SCr of 2.39 mg/dL (H)).  Liver Function Tests: Recent Labs  Lab 04/06/2019 2053  AST 21  ALT 8  ALKPHOS 80  BILITOT 5.4*  PROT 5.2*  ALBUMIN 1.6*   No results for input(s): LIPASE, AMYLASE in the last 168 hours. No results for input(s): AMMONIA in the last 168 hours. Coagulation Profile: Recent Labs  Lab 04/15/2019 2053  INR 1.8*   Cardiac Enzymes: No results for input(s): CKTOTAL, CKMB, CKMBINDEX, TROPONINI in the last 168 hours. BNP (last 3 results) No results for input(s): PROBNP in the last 8760 hours. HbA1C: No results for input(s): HGBA1C in the last 72 hours. CBG: No results for input(s): GLUCAP in the last 168 hours. Lipid Profile: No results for input(s): CHOL, HDL, LDLCALC, TRIG, CHOLHDL, LDLDIRECT in the last 72 hours. Thyroid Function Tests: No results for input(s): TSH, T4TOTAL, FREET4, T3FREE, THYROIDAB in the last 72 hours. Anemia Panel: No results for input(s): VITAMINB12, FOLATE, FERRITIN, TIBC, IRON, RETICCTPCT in the last 72 hours. Urine analysis:    Component Value Date/Time   COLORURINE AMBER (A) 04/04/2019 1950   APPEARANCEUR HAZY (A) 04/13/2019 1950   LABSPEC  1.012 03/22/2019 1950   PHURINE 6.0 03/26/2019 1950   GLUCOSEU NEGATIVE 04/03/2019 1950   HGBUR LARGE (A) 03/20/2019 1950   BILIRUBINUR NEGATIVE 03/26/2019 [place] NEGATIVE 04/07/2019 1950   PROTEINUR 100 (A) 04/03/2019 1950   NITRITE NEGATIVE 04/15/2019 1950   LEUKOCYTESUR LARGE (A) 03/25/2019 1950   Sepsis Labs: '@LABRCNTIP' (procalcitonin:4,lacticidven:4) ) Recent Results (from the past 240 hour(s))  SARS Coronavirus 2 (CEPHEID - Performed in [place] hospital lab), Hosp Order     Status: None   Collection Time: 04/15/2019  7:18 PM   Specimen: Nasopharyngeal Swab  Result Value Ref Range Status   SARS Coronavirus 2 NEGATIVE NEGATIVE Final    Comment: (NOTE) If result is NEGATIVE SARS-CoV-2 target nucleic acids are NOT DETECTED. The SARS-CoV-2 RNA is generally detectable in upper and lower  respiratory specimens during the acute phase of infection. The lowest  concentration of SARS-CoV-2 viral copies this assay can detect is 250  copies / mL. A negative result does not preclude SARS-CoV-2 infection  and should not be used as the sole basis for treatment or other  patient management decisions.  A negative result may occur with  improper specimen collection / handling, submission of specimen other  than nasopharyngeal swab, presence of viral mutation(s) within the  areas targeted by this assay, and inadequate number of viral copies  (<250 copies / mL). A negative result must be combined with clinical  observations, patient history, and epidemiological information. If result is POSITIVE SARS-CoV-2 target nucleic acids are DETECTED. The SARS-CoV-2 RNA is generally detectable in upper and lower  respiratory specimens dur ing the acute phase of infection.  Positive  results are indicative of active infection with SARS-CoV-2.  Clinical  correlation with patient history and other diagnostic information is  necessary to determine patient infection status.  Positive results do   not rule out bacterial infection or co-infection with other viruses. If result is PRESUMPTIVE POSTIVE SARS-CoV-2 nucleic acids MAY BE PRESENT.   A presumptive positive result was obtained on the submitted specimen  and confirmed on repeat testing.  While 2019 novel coronavirus  (SARS-CoV-2) nucleic acids may be present in the submitted sample  additional confirmatory testing may be necessary for epidemiological  and / or clinical management purposes  to differentiate between  SARS-CoV-2 and other Sarbecovirus currently known to infect humans.  If clinically indicated additional testing  with an alternate test  methodology [phone]) is advised. The SARS-CoV-2 RNA is generally  detectable in upper and lower respiratory sp ecimens during the acute  phase of infection. The expected result is Negative. Fact Sheet for Patients:  StrictlyIdeas.no Fact Sheet for Healthcare Providers: BankingDealers.co.za This test is not yet approved or cleared by the [place] FDA and has been authorized for detection and/or diagnosis of SARS-CoV-2 by FDA under an Emergency Use Authorization (EUA).  This EUA will remain in effect (meaning this test can be used) for the duration of the COVID-19 declaration under Section 564(b)(1) of the Act, 21 U.S.C. section 360bbb-3(b)(1), unless the authorization is terminated or revoked sooner. Performed at [facility], [place] [address] [place], [place] [address]   Blood Culture (routine x 2)     Status: None (Preliminary result)   Collection Time: 04/17/2019  8:53 PM   Specimen: BLOOD RIGHT FOREARM  Result Value Ref Range Status   Specimen Description BLOOD RIGHT FOREARM  Final   Special Requests   Final    BOTTLES DRAWN AEROBIC ONLY Blood Culture results may not be optimal due to an inadequate volume of blood received in culture bottles Performed at [facility] [address], [place], [place] [address]     Culture PENDING  Incomplete   Report Status PENDING  Incomplete     Radiological Exams on Admission: Dg Chest Port 1 View  Result Date: 03/25/2019 CLINICAL DATA:  Shortness of breath EXAM: PORTABLE CHEST 1 VIEW COMPARISON:  Feb 19, 2019 FINDINGS: The previously noted tunneled central venous catheter, tracheostomy tube, and enteric tube have all been removed. The heart size is enlarged. There are multifocal airspace opacities bilaterally. There is volume overload. There are small bilateral pleural effusions, right greater than left. There is no pneumothorax. There is no acute osseous abnormality. There appear to be some old right-sided rib fractures. IMPRESSION: 1. Multifocal airspace opacities bilaterally concerning for multifocal pneumonia (viral or bacterial). 2. Cardiomegaly with generalized volume overload. 3. Small bilateral pleural effusions are noted. Electronically Signed   By: Constance Holster M.D.   On: 03/22/2019 19:08    EKG: Independently reviewed. Sinus tachycardia (rqate 128).   Assessment/Plan   1. Severe sepsis secondary to PNA  - Presents with aches, malaise, increasing swelling despite Lasix, and SOB - She is afebrile, tachycardic, markedly tachypneic, with normal WBC, lactate 2.5, urine suggestive of possible infection, and CXR concerning for multifocal PNA  - COVID-19 is negative; she has hx of epidural abscess and discitis but recent MRI negative for acute findings  - She has hx of MRSA bacteremia and MRSA TV endocarditis with vancomycin allergy  - Blood cultures were collected in ED and cefepime and linezolid were initiated in consultation with pharmacy  - Continue empiric antibiotics, follow cultures and clinical course    2. Thrombocytopenia  - Platelets 8,000 on admission with bleeding identified; was 159k in May 2020  - Hgb appears stable; no schistocytes reported on smear  - Check DIC panel, treat sepsis, confirm platelet count and transfuse platelets if <10k  as she is at high risk for spontaneous bleeding    3. 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  Hyperbilirubinemia  - Total bilirubin is 5.4 on admission, having been normal in April 2020; albumin is 1.6 and INR 1.8 with normal transaminases and normal alk phos   - Concern for hemolysis and/or liver failure  - Fractionate bilirubin, check abd [place] and viral hepatitis panel, treat sepsis, repeat CMP in am    5. Hyponatremia  - Serum sodium is 127 on admission in setting of hypervolemia  - Diurese, follow serial sodium levels    6. Hypokalemia  - Serum potassium is 2.8 in ED  - Replaced with IV and oral potassium  - Repeat chem panel in am   7. Anasarca  - Presents with increased swelling despite taking Lasix at home  - EF and diastolic parameters were normal in April 2020  - Likely multifactorial with albumin only 1.6 and acute on chronic renal failure  - She was given a liter NS in ED in setting of sepsis  - SLIV, diurese with Lasix 40 mg IV q12h, will need close monitor renal function and electrolytes closely during diuresis     PPE: Mask, face shield. Patient wearing mask.  DVT prophylaxis: SCD's  Code Status: Full  Family Communication: Discussed with patient  Consults called: None Admission status: Inpatient     Vianne Bulls, MD [facility] Pager [phone]  If 7PM-7AM, please contact night-coverage [email or web address] Password [facility]  04/11/2019, 10:32 PM

## 2019-04-16 NOTE — Telephone Encounter (Signed)
Patient's mother called, asking for an appointment on Wednesday for her daughter. She states her daughter is "swelling more than when she was hospitalized 6 weeks ago", has been incontinent of bowels/bladder since Thursday - this is new. Her mother has been calling the nephrologists office as well. She states that Shalise was given a prescription of lasix, but does not think she is taking it right. Her mother wants Ahley to go to the emergency room, but Taneika does not want to go. Per Dr Megan Salon (only physician available in clinic at the time of the call), Molly [name] needs to go to [facility] emergency room to have her changes evaluated emergently. RN called [facility] [place], left voicemail with the instructions, called her mother, left voicemail with same instructions. Asked them to call back and confirm. Landis Gandy, RN

## 2019-04-16 NOTE — Progress Notes (Signed)
Pharmacy Antibiotic Note  Molly [name] is a 33 y.o. female admitted on 04/05/2019 with pneumonia.  Pharmacy has been consulted for Cefepime and Linezolid dosing. Patient with allergy to Vancomycin.   Plan: Cefepime 1 gram IV q24hr Linezolid 600 mg IV x 1 dose - Will need to consult ID for further Linezolid doses F/u on further Linezolid doses, renal function and C&S  Height: 5\' 1"  (154.9 cm) Weight: 108 lb (49 kg) IBW/kg (Calculated) : 47.8  Temp (24hrs), Avg:98.2 F (36.8 C), Min:98.2 F (36.8 C), Max:98.2 F (36.8 C)  Recent Labs  Lab 04/02/2019 2039  CREATININE 2.20*    Estimated Creatinine Clearance: 28 mL/min (A) (by C-G formula based on SCr of 2.2 mg/dL (H)).    Allergies  Allergen Reactions  . Lactose Intolerance (Gi)   . Vancomycin Other (See Comments)    Possible contributor to AKI and thrombocytopenia    Antimicrobials this admission: Cefepime 6/29 >>  Linezolid x 1 6/29  Thank you for allowing pharmacy to be a part of this patient's care.  Alanda Slim, PharmD, [facility] Clinical Pharmacist Please see AMION for all Pharmacists' Contact Phone Numbers 04/01/2019, 9:05 PM

## 2019-04-16 NOTE — ED Provider Notes (Signed)
[facility] EMERGENCY DEPARTMENT Provider Note   CSN: [id number] Arrival date & time: 04/13/2019  1753     History   Chief Complaint Chief Complaint  Patient presents with   Leg Pain    HPI Molly [name] is a 33 y.o. female.  Very complicated young lady with IV drug use, epidural abscess, osteomyelitis, septic PEs.  She had a prolonged ICU stay and is currently home living with her mother.  It sounds like she has had some progressive renal issues and was possibly going to require dialysis but the nephrologist did not think her heart was stable enough.  She is here with complaints of continued weakness whole body pain and increased swelling on her legs and unable to walk.  She is also had some diarrhea.  She denies any fevers.  She said she last shot up some heroin yesterday.     The history is provided by the patient.  Weakness Severity:  Severe Onset quality:  Gradual Timing:  Constant Progression:  Worsening Chronicity:  New Relieved by:  Nothing Worsened by:  Activity Ineffective treatments:  None tried Associated symptoms: diarrhea, difficulty walking, myalgias, nausea and vomiting   Associated symptoms: no abdominal pain, no chest pain, no cough, no dysuria, no fever and no shortness of breath     Past Medical History:  Diagnosis Date   Abscess in epidural space of T12/L1 spine    Abscess of skin    buttock - most recently 2009/10   CKD (chronic kidney disease) stage 3, GFR 30-59 ml/min (HCC)    Drug-seeking behavior    Facial edema 03/21/2019   Hypersplenism    IV drug user    Leg edema 03/21/2019   MRSA (methicillin resistant Staphylococcus aureus) infection    MRSA bacteremia    Pancytopenia ([place])    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)    Septic pulmonary embolism ([place])    Tobacco abuse     Patient Active Problem List   Diagnosis Date Noted   Leg edema 03/21/2019   Facial edema 03/21/2019   Tracheostomy status  ([place])    Pressure injury of skin 02/15/2019   Leukopenia    Goals of care, counseling/discussion    Advanced care planning/counseling discussion    Acute cystitis with hematuria    DIC (disseminated intravascular coagulation) ([place])    Multifocal pneumonia    Acute encephalopathy 01/20/2019   Thrombocytopenia ([place]) 01/20/2019   Sepsis with disseminated intravascular coagulopathy (DIC) ([place]) 01/20/2019   Sepsis ([place]) 01/20/2019   Pancytopenia, acquired ([place]) 04/22/2018   Hypersplenism 04/22/2018   CKD (chronic kidney disease) stage 3, GFR 30-59 ml/min ([place]) 04/22/2018   IV drug user 04/22/2018   MSSA bacteremia 04/22/2018   Acute septic pulmonary embolism with acute cor pulmonale (June 2019) 04/22/2018    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 and T12/L1 04/22/2018   Tobacco abuse 04/22/2018   Noncompliance 04/22/2018   Abnormal urinalysis 04/22/2018   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   Addiction to drug ([place])    Acute kidney failure (HCC)    Epidural abscess    Acute bacterial endocarditis    Chronic thoracic back pain    Palliative care by specialist    Sepsis with multiple organ dysfunction (MOD) ([place]) 04/08/2018   Hyponatremia 04/08/2018   Eczema 07/12/2017   Acute upper respiratory infection 11/02/2016   Hepatitis C antibody test positive 08/24/2016   Discitis of lumbosacral region 07/19/2016   Splenomegaly    Osteomyelitis of  vertebra of thoracolumbar region [facility])    Polysubstance abuse ([place])    Encounter for orogastric (OG) tube placement    Cocaine use 11/28/2015   Pancytopenia ([place]) 11/27/2015   AKI (acute kidney injury) ([place]) 11/27/2015   Anasarca 11/27/2015   Cellulitis of multiple sites of right hand and fingers 07/07/2013   Acute respiratory failure ([place]) 07/04/2013   Chronic anemia 07/04/2013   Pulmonary HTN ([place]) 07/04/2013   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 07/04/2013   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   Septic pulmonary embolism ([place]) 07/03/2013   Intravenous drug abuse, continuous ([place]) 07/03/2013   MRSA bacteremia 07/03/2013    Past Surgical History:  Procedure Laterality Date   IR FLUORO GUIDE CV LINE RIGHT  02/05/2019   IR GENERIC HISTORICAL  07/19/2016   IR LUMBAR DISC ASPIRATION W/IMG GUIDE 07/19/2016 Arne Cleveland, MD [facility]-INTERV RAD   IR REMOVAL TUN CV CATH W/O FL  03/06/2019   IR US GUIDE VASC ACCESS RIGHT  02/05/2019   RADIOLOGY WITH ANESTHESIA N/A 04/11/2018   Procedure: MRI OF LUMBAR AND THORACIC SPINE WITHOUT CONTRAST WITH ANESTHESIA;  Surgeon: Radiologist, Medication, MD;  Location: [place];  Service: Radiology;  Laterality: N/A;   TEE WITHOUT CARDIOVERSION N/A 07/04/2013   Procedure: TRANSESOPHAGEAL ECHOCARDIOGRAM (TEE);  Surgeon: Larey Dresser, MD;  Location: [facility] ENDOSCOPY;  Service: Cardiovascular;  Laterality: N/A;   TONSILLECTOMY     33 years old     OB History   No obstetric history on file.      Home Medications    Prior to Admission medications   Medication Sig Start Date End Date Taking? Authorizing Provider  buprenorphine-naloxone (SUBOXONE) 2-0.5 mg SUBL SL tablet Place 2 tablets under the tongue 2 (two) times a day. 03/07/19   Sid Falcon, MD  clonazePAM (KLONOPIN) 0.5 MG tablet Take 1 tablet (0.5 mg total) by mouth 2 (two) times daily as needed for anxiety. 03/06/19 03/05/20  Mercy Riding, MD  feeding supplement, ENSURE ENLIVE, (ENSURE ENLIVE) LIQD Take 237 mLs by mouth 2 (two) times daily between meals. Patient not taking: Reported on 03/21/2019 03/07/19   Mercy Riding, MD  levETIRAcetam (KEPPRA XR) 500 MG 24 hr tablet Take 1 tablet (500 mg total) by mouth every 12 (twelve) hours. 03/06/19   Mercy Riding, MD  metoprolol succinate (TOPROL-XL) 50 MG 24 hr tablet Take 1 tablet (50 mg total) by mouth daily. Patient not taking: Reported on 03/21/2019 03/07/19   Mercy Riding, MD  Multiple Vitamin (MULTIVITAMIN WITH MINERALS) TABS  tablet Take 1 tablet by mouth daily. 03/07/19   Mercy Riding, MD  pantoprazole (PROTONIX) 40 MG tablet Take 1 tablet (40 mg total) by mouth daily. Patient not taking: Reported on 03/21/2019 03/07/19   Mercy Riding, MD  senna-docusate (SENOKOT-S) 8.6-50 MG tablet Take 1 tablet by mouth at bedtime as needed for mild constipation. Patient not taking: Reported on 07/12/2017 07/21/16   Florinda Marker, MD  vitamin C (VITAMIN C) 250 MG tablet Take 1 tablet (250 mg total) by mouth 2 (two) times daily. Patient not taking: Reported on 03/21/2019 03/06/19   Mercy Riding, MD    Family History Family History  Problem Relation Age of Onset   Alcoholism Father     Social History Social History   Tobacco Use   Smoking status: Current Every Day Smoker    Packs/day: 0.50    Years: 7.00    Pack years: 3.50    Types:  Cigarettes   Smokeless tobacco: Never Used   Tobacco comment: slowing down  Substance Use Topics   Alcohol use: No   Drug use: Yes    Types: IV, Oxycodone    Comment: heroin     Allergies   Lactose intolerance (gi) and Vancomycin   Review of Systems Review of Systems  Constitutional: Positive for activity change. Negative for fever.  HENT: Negative for sore throat.   Eyes: Negative for visual disturbance.  Respiratory: Negative for cough and shortness of breath.   Cardiovascular: Negative for chest pain.  Gastrointestinal: Positive for diarrhea, nausea and vomiting. Negative for abdominal pain.  Genitourinary: Negative for dysuria.  Musculoskeletal: Positive for back pain, gait problem and myalgias.  Skin: Negative for rash.  Neurological: Positive for weakness.     Physical Exam Updated Vital Signs BP (!) 118/92 (BP Location: Left Arm)    Pulse (!) 129    Temp 98.2 F (36.8 C) (Oral)    Resp 16    Ht 5\' 1"  (1.549 m)    Wt 49 kg    SpO2 92%    BMI 20.41 kg/m   Physical Exam Vitals signs and nursing note reviewed.  Constitutional:      General: She is not in  acute distress.    Appearance: She is well-developed. She is toxic-appearing.  HENT:     Head: Normocephalic and atraumatic.  Eyes:     Conjunctiva/sclera: Conjunctivae normal.  Neck:     Musculoskeletal: Neck supple.  Cardiovascular:     Rate and Rhythm: Regular rhythm. Tachycardia present.     Heart sounds: No murmur.  Pulmonary:     Effort: Tachypnea and accessory muscle usage present. No respiratory distress.     Breath sounds: Normal breath sounds.  Abdominal:     Palpations: Abdomen is soft.     Tenderness: There is no abdominal tenderness.  Musculoskeletal:        General: Swelling and tenderness present. No deformity.     Right lower leg: Edema present.     Left lower leg: Edema present.     Comments: Is a lot of bruising and track marks on her right forearm.  Skin:    General: Skin is warm and dry.     Capillary Refill: Capillary refill takes less than 2 seconds.  Neurological:     General: No focal deficit present.     Mental Status: She is alert and oriented to person, place, and time.      ED Treatments / Results  Labs (all labs ordered are listed, but only abnormal results are displayed) Labs Reviewed  LACTIC ACID, PLASMA - Abnormal; Notable for the following components:      Result Value   Lactic Acid, Venous 2.5 (*)    All other components within normal limits  URINALYSIS, ROUTINE W REFLEX MICROSCOPIC - Abnormal; Notable for the following components:   Color, Urine AMBER (*)    APPearance HAZY (*)    Hgb urine dipstick LARGE (*)    Protein, ur 100 (*)    Leukocytes,Ua LARGE (*)    RBC / HPF >50 (*)    Bacteria, UA MANY (*)    Non Squamous Epithelial 0-5 (*)    All other components within normal limits  CBC WITH DIFFERENTIAL/PLATELET - Abnormal; Notable for the following components:   RBC 2.91 (*)    Hemoglobin 8.6 (*)    HCT 27.2 (*)    RDW 17.1 (*)    Platelets 8 (*)  Lymphs Abs 0.2 (*)    Abs Immature Granulocytes 0.63 (*)    All other  components within normal limits  COMPREHENSIVE METABOLIC PANEL - Abnormal; Notable for the following components:   Sodium 127 (*)    Potassium 2.8 (*)    Chloride 97 (*)    CO2 16 (*)    BUN 53 (*)    Creatinine, Ser 2.39 (*)    Calcium 7.3 (*)    Total Protein 5.2 (*)    Albumin 1.6 (*)    Total Bilirubin 5.4 (*)    GFR calc non Af Amer 26 (*)    GFR calc Af Amer 30 (*)    All other components within normal limits  PROTIME-INR - Abnormal; Notable for the following components:   Prothrombin Time 20.7 (*)    INR 1.8 (*)    All other components within normal limits  I-STAT CHEM 8, ED - Abnormal; Notable for the following components:   Sodium 125 (*)    Potassium 6.4 (*)    BUN 71 (*)    Creatinine, Ser 2.20 (*)    Calcium, Ion 0.78 (*)    TCO2 17 (*)    Hemoglobin 9.2 (*)    HCT 27.0 (*)    All other components within normal limits  I-STAT BETA HCG BLOOD, ED ([facility], [facility], [facility] ONLY) - Abnormal; Notable for the following components:   I-stat hCG, quantitative 8.5 (*)    All other components within normal limits  CULTURE, BLOOD (ROUTINE X 2)  SARS CORONAVIRUS 2 (HOSPITAL ORDER, [place] [facility])  CULTURE, BLOOD (ROUTINE X 2)  LACTIC ACID, PLASMA  CBC WITH DIFFERENTIAL/PLATELET  RAPID URINE DRUG SCREEN, HOSP PERFORMED  TYPE AND SCREEN    EKG EKG Interpretation  Date/Time:  Monday April 16 2019 18:26:52 EDT Ventricular Rate:  128 PR Interval:  142 QRS Duration: 90 QT Interval:  300 QTC Calculation: 438 R Axis:   93 Text Interpretation:  Sinus tachycardia Biatrial enlargement Rightward axis Cannot rule out Anterior infarct , age undetermined Abnormal ECG increased rate and nonspecific st/ts compared with prior 5/20 Confirmed by Aletta Edouard [phone]) on 04/09/2019 6:40:04 PM   Radiology Dg Chest Port 1 View  Result Date: 04/04/2019 CLINICAL DATA:  Shortness of breath EXAM: PORTABLE CHEST 1 VIEW COMPARISON:  Feb 19, 2019 FINDINGS: The previously noted  tunneled central venous catheter, tracheostomy tube, and enteric tube have all been removed. The heart size is enlarged. There are multifocal airspace opacities bilaterally. There is volume overload. There are small bilateral pleural effusions, right greater than left. There is no pneumothorax. There is no acute osseous abnormality. There appear to be some old right-sided rib fractures. IMPRESSION: 1. Multifocal airspace opacities bilaterally concerning for multifocal pneumonia (viral or bacterial). 2. Cardiomegaly with generalized volume overload. 3. Small bilateral pleural effusions are noted. Electronically Signed   By: Constance Holster M.D.   On: 03/19/2019 19:08    Procedures .Critical Care Performed by: Hayden Rasmussen, MD Authorized by: Hayden Rasmussen, MD   Critical care provider statement:    Critical care time (minutes):  45   Critical care time was exclusive of:  Separately billable procedures and treating other patients   Critical care was necessary to treat or prevent imminent or life-threatening deterioration of the following conditions:  Metabolic crisis, renal failure and sepsis   Critical care was time spent personally by me on the following activities:  Discussions with consultants, evaluation of patient's response  to treatment, examination of patient, ordering and performing treatments and interventions, ordering and review of laboratory studies, ordering and review of radiographic studies, pulse oximetry, re-evaluation of patient's condition, obtaining history from patient or surrogate, review of old charts and development of treatment plan with patient or surrogate   I assumed direction of critical care for this patient from another provider in my specialty: no   Ultrasound ED Peripheral IV (Provider)  Date/Time: 03/23/2019 8:10 PM Performed by: Hayden Rasmussen, MD Authorized by: Hayden Rasmussen, MD   Procedure details:    Indications: poor IV access     Skin Prep:  chlorhexidine gluconate     Location:  Right forearm   Angiocath:  20 G   Bedside Ultrasound Guided: Yes     Images: not archived     Patient tolerated procedure without complications: Yes     Dressing applied: Yes     (including critical care time)  Medications Ordered in ED Medications  ceFEPIme (MAXIPIME) 1 g in sodium chloride 0.9 % 100 mL IVPB (0 g Intravenous Stopped 04/05/2019 2234)  linezolid (ZYVOX) IVPB 600 mg (600 mg Intravenous New Bag/Given 03/27/2019 2234)  potassium chloride 10 mEq in 100 mL IVPB (10 mEq Intravenous New Bag/Given 03/28/2019 2231)  fentaNYL (SUBLIMAZE) injection 25-50 mcg (has no administration in time range)  potassium chloride SA (K-DUR) CR tablet 20 mEq (has no administration in time range)  furosemide (LASIX) injection 40 mg (has no administration in time range)  sodium chloride 0.9 % bolus 1,000 mL (0 mLs Intravenous Stopped 03/29/2019 2234)  HYDROmorphone (DILAUDID) injection 1 mg (1 mg Intravenous Given 04/10/2019 2139)     Initial Impression / Assessment and Plan / ED Course  I have reviewed the triage vital signs and the nursing notes.  Pertinent labs & imaging results that were available during my care of the patient were reviewed by me and considered in my medical decision making (see chart for details).  Clinical Course as of Apr 15 2306  Mon Apr 15, 5121  2315 33 year old female here with progressive peripheral edema and whole body pain.  Differential diagnosis includes sepsis, renal failure, bacteremia, epidural abscess, pneumonia, Covid   [MB]  1916 Chest x-ray showing possible multifocal pneumonia.  Placed a pharmacy consult in for help in starting some antibiotics on her.   [MB]  2041 I put a peripheral IV and get some blood work but ultimately was not able to use it for any IV fluids.  Phlebotomy and IV team are down now trying to get some more blood work and have talked with pharmacy regarding some empiric antibiotics.   [MB]  2129 Patient's  i-STAT potassium was 6.4.  Her EKG does not show any peaking so can hold off and wait until her chemistry results.  Hemoglobin low but within her normal baseline.     [MB]  2220 Patient's CMP shows a low potassium at 2.8.  She is got some worsening of her creatinine and a new elevated bili.  I have ordered her some IV potassium.  Her platelets are critically low at 8 although she is not exhibiting any gross bleeding but does have some signs of blood in her urine.  Antibiotics have been given and Covid negative.  I talked with hospitalist Dr. Gladys Damme who will evaluate the patient for admission.   [MB]    Clinical Course User Index [MB] Hayden Rasmussen, MD   Molly [name] was evaluated in Emergency Department on 03/19/2019 for the  symptoms described in the history of present illness. She was evaluated in the context of the global COVID-19 pandemic, which necessitated consideration that the patient might be at risk for infection with the SARS-CoV-2 virus that causes COVID-19. Institutional protocols and algorithms that pertain to the evaluation of patients at risk for COVID-19 are in a state of rapid change based on information released by regulatory bodies including the CDC and federal and state organizations. These policies and algorithms were followed during the patient's care in the ED.      Final Clinical Impressions(s) / ED Diagnoses   Final diagnoses:  Thrombocytopenia (HCC)  SIRS (systemic inflammatory response syndrome) (HCC)  Multifocal pneumonia  Chronic kidney disease, unspecified CKD stage  Hypokalemia    ED Discharge Orders    None       Hayden Rasmussen, MD 04/02/2019 2308

## 2019-04-16 NOTE — ED Notes (Signed)
ED TO INPATIENT HANDOFF REPORT  ED Nurse Name and Phone #: Sherrine Maples [phone]  S Name/Age/Gender Sherron Flemings 33 y.o. female Room/Bed: 027C/027C  Code Status   Code Status: Prior  Home/SNF/Other Home Patient oriented to: self, place, time and situation Is this baseline? Yes   Triage Complete: Triage complete  Chief Complaint end stage renal stage  Triage Note Pt here due to her muscles in her body hurting and diarrhea. Pt is not able to walk or bear weight    Allergies Allergies  Allergen Reactions  . Lactose Intolerance (Gi)   . Vancomycin Other (See Comments)    Possible contributor to AKI and thrombocytopenia    Level of Care/Admitting Diagnosis ED Disposition    ED Disposition Condition [facility]: [place] [[id number]]  Level of Care: Progressive [102]  Covid Evaluation: Confirmed COVID Negative  Diagnosis: Sepsis with multi-organ dysfunction [facility]) [[id number]]  Admitting Physician: Vianne Bulls [[id number]]  Attending Physician: Vianne Bulls [[id number]]  Estimated length of stay: past midnight tomorrow  Certification:: I certify this patient will need inpatient services for at least 2 midnights  PT Class (Do Not Modify): Inpatient [101]  PT Acc Code (Do Not Modify): Private [1]       B Medical/Surgery History Past Medical History:  Diagnosis Date  . 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 Abscess of skin    buttock - most recently 2009/10  . CKD (chronic kidney disease) stage 3, GFR 30-59 ml/min (HCC)   . Drug-seeking behavior   . Facial edema 03/21/2019  . Hypersplenism   . IV drug user   . Leg edema 03/21/2019  . MRSA (methicillin resistant Staphylococcus aureus) infection   . MRSA bacteremia   . Pancytopenia ([place])   . 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 Septic pulmonary embolism ([place])   . Tobacco abuse    Past Surgical History:  Procedure Laterality Date  . IR FLUORO GUIDE CV LINE RIGHT   02/05/2019  . IR GENERIC HISTORICAL  07/19/2016   IR LUMBAR DISC ASPIRATION W/IMG GUIDE 07/19/2016 Arne Cleveland, MD [facility]-INTERV RAD  . IR REMOVAL TUN CV CATH W/O FL  03/06/2019  . IR US GUIDE VASC ACCESS RIGHT  02/05/2019  . RADIOLOGY WITH ANESTHESIA N/A 04/11/2018   Procedure: MRI OF LUMBAR AND THORACIC SPINE WITHOUT CONTRAST WITH ANESTHESIA;  Surgeon: Radiologist, Medication, MD;  Location: [facility];  Service: Radiology;  Laterality: N/A;  . TEE WITHOUT CARDIOVERSION N/A 07/04/2013   Procedure: TRANSESOPHAGEAL ECHOCARDIOGRAM (TEE);  Surgeon: Larey Dresser, MD;  Location: [facility] ENDOSCOPY;  Service: Cardiovascular;  Laterality: N/A;  . TONSILLECTOMY     33 years old     A IV Location/Drains/Wounds Patient Lines/Drains/Airways Status   Active Line/Drains/Airways    Name:   Placement date:   Placement time:   Site:   Days:   Peripheral IV 03/26/2019 Right;Anterior Forearm   04/13/2019    2046    Forearm   less than 1   External Urinary Catheter   03/01/19    0044    -   46   External Urinary Catheter   04/14/2019    1958    -   less than 1   Pressure Injury 02/13/19 Stage II -  Partial thickness loss of dermis presenting as a shallow open ulcer with a red, pink wound bed without slough.   02/13/19    2100     62  Pressure Injury 02/16/19 Deep Tissue Injury - Purple or maroon localized area of discolored intact skin or blood-filled blister due to damage of underlying soft tissue from pressure and/or shear.   02/16/19    0000     59          Intake/Output Last 24 hours No intake or output data in the 24 hours ending 03/19/2019 2245  Labs/Imaging Results for orders placed or performed during the hospital encounter of 03/21/2019 (from the past 48 hour(s))  SARS Coronavirus 2 (CEPHEID - Performed in [facility] hospital lab), Hosp Order     Status: None   Collection Time: 04/14/2019  7:18 PM   Specimen: Nasopharyngeal Swab  Result Value Ref Range   SARS Coronavirus 2 NEGATIVE NEGATIVE    Comment: (NOTE) If  result is NEGATIVE SARS-CoV-2 target nucleic acids are NOT DETECTED. The SARS-CoV-2 RNA is generally detectable in upper and lower  respiratory specimens during the acute phase of infection. The lowest  concentration of SARS-CoV-2 viral copies this assay can detect is 250  copies / mL. A negative result does not preclude SARS-CoV-2 infection  and should not be used as the sole basis for treatment or other  patient management decisions.  A negative result may occur with  improper specimen collection / handling, submission of specimen other  than nasopharyngeal swab, presence of viral mutation(s) within the  areas targeted by this assay, and inadequate number of viral copies  (<250 copies / mL). A negative result must be combined with clinical  observations, patient history, and epidemiological information. If result is POSITIVE SARS-CoV-2 target nucleic acids are DETECTED. The SARS-CoV-2 RNA is generally detectable in upper and lower  respiratory specimens dur ing the acute phase of infection.  Positive  results are indicative of active infection with SARS-CoV-2.  Clinical  correlation with patient history and other diagnostic information is  necessary to determine patient infection status.  Positive results do  not rule out bacterial infection or co-infection with other viruses. If result is PRESUMPTIVE POSTIVE SARS-CoV-2 nucleic acids MAY BE PRESENT.   A presumptive positive result was obtained on the submitted specimen  and confirmed on repeat testing.  While 2019 novel coronavirus  (SARS-CoV-2) nucleic acids may be present in the submitted sample  additional confirmatory testing may be necessary for epidemiological  and / or clinical management purposes  to differentiate between  SARS-CoV-2 and other Sarbecovirus currently known to infect humans.  If clinically indicated additional testing with an alternate test  methodology [phone]) is advised. The SARS-CoV-2 RNA is generally   detectable in upper and lower respiratory sp ecimens during the acute  phase of infection. The expected result is Negative. Fact Sheet for Patients:  StrictlyIdeas.no Fact Sheet for Healthcare Providers: BankingDealers.co.za This test is not yet approved or cleared by the [place] FDA and has been authorized for detection and/or diagnosis of SARS-CoV-2 by FDA under an Emergency Use Authorization (EUA).  This EUA will remain in effect (meaning this test can be used) for the duration of the COVID-19 declaration under Section 564(b)(1) of the Act, 21 U.S.C. section 360bbb-3(b)(1), unless the authorization is terminated or revoked sooner. Performed at [facility], [place] [address], [place],  [address]   Urinalysis, Routine w reflex microscopic     Status: Abnormal   Collection Time: 04/02/2019  7:50 PM  Result Value Ref Range   Color, Urine AMBER (A) YELLOW    Comment: BIOCHEMICALS MAY BE AFFECTED BY COLOR  APPearance HAZY (A) CLEAR   Specific Gravity, Urine 1.012 1.005 - 1.030   pH 6.0 5.0 - 8.0   Glucose, UA NEGATIVE NEGATIVE mg/dL   Hgb urine dipstick LARGE (A) NEGATIVE   Bilirubin Urine NEGATIVE NEGATIVE   Ketones, ur NEGATIVE NEGATIVE mg/dL   Protein, ur 100 (A) NEGATIVE mg/dL   Nitrite NEGATIVE NEGATIVE   Leukocytes,Ua LARGE (A) NEGATIVE   RBC / HPF >50 (H) 0 - 5 RBC/hpf   WBC, UA 21-50 0 - 5 WBC/hpf   Bacteria, UA MANY (A) NONE SEEN   WBC Clumps PRESENT    Granular Casts, UA PRESENT    Non Squamous Epithelial 0-5 (A) NONE SEEN    Comment: Performed at [place] [address], [place], [place] [address]  I-Stat beta hCG blood, ED     Status: Abnormal   Collection Time: 04/10/2019  8:37 PM  Result Value Ref Range   I-stat hCG, quantitative 8.5 (H) <5 mIU/mL   Comment 3            Comment:   GEST. AGE      CONC.  (mIU/mL)   <=1 WEEK        5 - 50     2 WEEKS       50 - 500     3 WEEKS       100 -  10,000     4 WEEKS     1,000 - 30,000        FEMALE AND NON-PREGNANT FEMALE:     LESS THAN 5 mIU/mL   I-stat chem 8, ED (not at [facility] or [facility])     Status: Abnormal   Collection Time: 04/15/2019  8:39 PM  Result Value Ref Range   Sodium 125 (L) 135 - 145 mmol/L   Potassium 6.4 (HH) 3.5 - 5.1 mmol/L   Chloride 101 98 - 111 mmol/L   BUN 71 (H) 6 - 20 mg/dL   Creatinine, Ser 2.20 (H) 0.44 - 1.00 mg/dL   Glucose, Bld 73 70 - 99 mg/dL   Calcium, Ion 0.78 (LL) 1.15 - 1.40 mmol/L   TCO2 17 (L) 22 - 32 mmol/L   Hemoglobin 9.2 (L) 12.0 - 15.0 g/dL   HCT 27.0 (L) 36.0 - 46.0 %   Comment NOTIFIED PHYSICIAN   Type and screen [place]     Status: None   Collection Time: 04/08/2019  8:43 PM  Result Value Ref Range   ABO/RH(D) O NEG    Antibody Screen NEG    Sample Expiration      04/19/2019,2359 Performed at [facility], [address], [place], [place] [address]   Lactic acid, plasma     Status: Abnormal   Collection Time: 03/29/2019  8:50 PM  Result Value Ref Range   Lactic Acid, Venous 2.5 (HH) 0.5 - 1.9 mmol/L    Comment: CRITICAL RESULT CALLED TO, READ BACK BY AND VERIFIED WITH: Donata Clay 2148 03/19/2019 WBOND Performed at [facility], [place] [address], [place], [place] [address]   Blood Culture (routine x 2)     Status: None (Preliminary result)   Collection Time: 04/02/2019  8:53 PM   Specimen: BLOOD RIGHT FOREARM  Result Value Ref Range   Specimen Description BLOOD RIGHT FOREARM    Special Requests      BOTTLES DRAWN AEROBIC ONLY Blood Culture results may not be optimal due to an inadequate volume of blood received in  culture bottles Performed at [facility], [place] [address], [place], [place] [address]    Culture PENDING    Report Status PENDING   CBC with Differential/Platelet     Status: Abnormal   Collection Time: 04/02/2019  8:53 PM  Result Value Ref Range   WBC 4.8 4.0 - 10.5 K/uL   RBC 2.91 (L) 3.87 - 5.11 MIL/uL   Hemoglobin 8.6 (L) 12.0  - 15.0 g/dL   HCT 27.2 (L) 36.0 - 46.0 %   MCV 93.5 80.0 - 100.0 fL   MCH 29.6 26.0 - 34.0 pg   MCHC 31.6 30.0 - 36.0 g/dL   RDW 17.1 (H) 11.5 - 15.5 %   Platelets 8 (LL) 150 - 400 K/uL    Comment: REPEATED TO VERIFY Immature Platelet Fraction may be clinically indicated, consider ordering this additional test [id number] THIS CRITICAL RESULT HAS VERIFIED AND BEEN CALLED TO MALORIE Bradrick Kamau RN BY ZACH ROBINSON ON 06 29 2020 AT 2136, AND HAS BEEN READ BACK.     nRBC 0.0 0.0 - 0.2 %   Neutrophils Relative % 80 %   Neutro Abs 3.9 1.7 - 7.7 K/uL   Lymphocytes Relative 5 %   Lymphs Abs 0.2 (L) 0.7 - 4.0 K/uL   Monocytes Relative 2 %   Monocytes Absolute 0.1 0.1 - 1.0 K/uL   Eosinophils Relative 0 %   Eosinophils Absolute 0.0 0.0 - 0.5 K/uL   Basophils Relative 0 %   Basophils Absolute 0.0 0.0 - 0.1 K/uL   WBC Morphology MORPHOLOGY UNREMARKABLE    Immature Granulocytes 13 %   Abs Immature Granulocytes 0.63 (H) 0.00 - 0.07 K/uL    Comment: Performed at [facility] [address], [place], [place] [address]  Comprehensive metabolic panel     Status: Abnormal   Collection Time: 04/06/2019  8:53 PM  Result Value Ref Range   Sodium 127 (L) 135 - 145 mmol/L   Potassium 2.8 (L) 3.5 - 5.1 mmol/L   Chloride 97 (L) 98 - 111 mmol/L   CO2 16 (L) 22 - 32 mmol/L   Glucose, Bld 79 70 - 99 mg/dL   BUN 53 (H) 6 - 20 mg/dL   Creatinine, Ser 2.39 (H) 0.44 - 1.00 mg/dL   Calcium 7.3 (L) 8.9 - 10.3 mg/dL   Total Protein 5.2 (L) 6.5 - 8.1 g/dL   Albumin 1.6 (L) 3.5 - 5.0 g/dL   AST 21 15 - 41 U/L   ALT 8 0 - 44 U/L   Alkaline Phosphatase 80 38 - 126 U/L   Total Bilirubin 5.4 (H) 0.3 - 1.2 mg/dL   GFR calc non Af Amer 26 (L) >60 mL/min   GFR calc Af Amer 30 (L) >60 mL/min   Anion gap 14 5 - 15    Comment: Performed at [facility], [place] [address], [place], [place] [address]  Protime-INR     Status: Abnormal   Collection Time: 03/21/2019  8:53 PM  Result Value Ref Range   Prothrombin Time  20.7 (H) 11.4 - 15.2 seconds   INR 1.8 (H) 0.8 - 1.2    Comment: (NOTE) INR goal varies based on device and disease states. Performed at [facility], [place] [address], [place], [place] [address]    Dg Chest Port 1 View  Result Date: 04/15/2019 CLINICAL DATA:  Shortness of breath EXAM: PORTABLE CHEST 1 VIEW COMPARISON:  Feb 19, 2019 FINDINGS: The previously noted tunneled central venous  catheter, tracheostomy tube, and enteric tube have all been removed. The heart size is enlarged. There are multifocal airspace opacities bilaterally. There is volume overload. There are small bilateral pleural effusions, right greater than left. There is no pneumothorax. There is no acute osseous abnormality. There appear to be some old right-sided rib fractures. IMPRESSION: 1. Multifocal airspace opacities bilaterally concerning for multifocal pneumonia (viral or bacterial). 2. Cardiomegaly with generalized volume overload. 3. Small bilateral pleural effusions are noted. Electronically Signed   By: Constance Holster M.D.   On: 04/15/2019 19:08    Pending Labs Unresulted Labs (From admission, onward)    Start     Ordered   04/04/2019 2233  Urine rapid drug screen (hosp performed)  Add-on,   AD     04/04/2019 2232   04/11/2019 1849  Lactic acid, plasma  Now then every 2 hours,   STAT     03/29/2019 1849   03/26/2019 1849  CBC WITH DIFFERENTIAL  ONCE - STAT,   STAT     04/08/2019 1849   03/20/2019 1849  Blood Culture (routine x 2)  BLOOD CULTURE X 2,   STAT     04/15/2019 1849   Signed and Held  HIV antibody (Routine Testing)  Tomorrow morning,   R     Signed and Held   Signed and Held  Lactic acid, plasma  Once,   STAT     Signed and Held   Visual merchandiser and Herbalist smear review  Once,   R     Signed and Held   Signed and Held  Comprehensive metabolic panel  Tomorrow morning,   R     Signed and Held   Visual merchandiser and Held  Magnesium  Tomorrow morning,   R     Signed and Held   Visual merchandiser and Held  Urine culture   Add-on,   R     Signed and Held   Signed and Held  [employer] morning,   R     Signed and Held   Signed and Held  Prepare Pheresed Platelets  (Adult Blood Administration - Platelets (Pheresed))  Once,   R    Question Answer Comment  Number of Apheresis Units 1 unit (6-10 packs)   Transfusion Indications PLT Count </=10,000/mm      Signed and Held   Signed and Held  Sodium, urine, random  Add-on,   R     Signed and Held   Signed and Held  Urea nitrogen, urine  Add-on,   R     Signed and Held   Signed and Held  Creatinine, urine, random  Add-on,   R     Signed and Held   Signed and Held  Hepatitis panel, acute  Once,   R     Signed and Held   Signed and Held  DIC (disseminated intravasc coag) panel  ONCE - STAT,   R     Signed and Held   Signed and Held  Sodium  Now then every 8 hours,   R     Signed and Held          Vitals/Pain Today's Vitals   04/17/2019 2145 04/09/2019 2200 03/29/2019 2230 04/06/2019 2231  BP: 94/76 97/75 94/74    Pulse: (!) 119 (!) 118 (!) 119   Resp: (!) 53 (!) 56 (!) 62   Temp:      TempSrc:      SpO2: 92% 97% 97%   Weight:  Height:      PainSc:    10-Worst pain ever    Isolation Precautions No active isolations  Medications Medications  ceFEPIme (MAXIPIME) 1 g in sodium chloride 0.9 % 100 mL IVPB (0 g Intravenous Stopped 03/19/2019 2234)  linezolid (ZYVOX) IVPB 600 mg (600 mg Intravenous New Bag/Given 03/19/2019 2234)  potassium chloride 10 mEq in 100 mL IVPB (10 mEq Intravenous New Bag/Given 03/22/2019 2231)  fentaNYL (SUBLIMAZE) injection 25-50 mcg (has no administration in time range)  sodium chloride 0.9 % bolus 1,000 mL (0 mLs Intravenous Stopped 03/26/2019 2234)  HYDROmorphone (DILAUDID) injection 1 mg (1 mg Intravenous Given 04/09/2019 2139)    Mobility walks Low fall risk   Focused Assessments    R Recommendations: See Admitting Provider Note  Report given to:   Additional Notes:

## 2019-04-16 NOTE — ED Triage Notes (Signed)
Pt here due to her muscles in her body hurting and diarrhea. Pt is not able to walk or bear weight

## 2019-04-17 ENCOUNTER — Inpatient Hospital Stay (HOSPITAL_COMMUNITY): Payer: Medicaid Other

## 2019-04-17 ENCOUNTER — Inpatient Hospital Stay: Payer: Self-pay

## 2019-04-17 ENCOUNTER — Encounter (HOSPITAL_COMMUNITY): Payer: Self-pay | Admitting: Oncology

## 2019-04-17 DIAGNOSIS — R011 Cardiac murmur, unspecified: Secondary | ICD-10-CM

## 2019-04-17 DIAGNOSIS — F17210 Nicotine dependence, cigarettes, uncomplicated: Secondary | ICD-10-CM

## 2019-04-17 DIAGNOSIS — B9689 Other specified bacterial agents as the cause of diseases classified elsewhere: Secondary | ICD-10-CM

## 2019-04-17 DIAGNOSIS — D649 Anemia, unspecified: Secondary | ICD-10-CM

## 2019-04-17 DIAGNOSIS — M7989 Other specified soft tissue disorders: Secondary | ICD-10-CM

## 2019-04-17 DIAGNOSIS — F1990 Other psychoactive substance use, unspecified, uncomplicated: Secondary | ICD-10-CM

## 2019-04-17 DIAGNOSIS — Z8614 Personal history of Methicillin resistant Staphylococcus aureus infection: Secondary | ICD-10-CM

## 2019-04-17 DIAGNOSIS — B9562 Methicillin resistant Staphylococcus aureus infection as the cause of diseases classified elsewhere: Secondary | ICD-10-CM

## 2019-04-17 DIAGNOSIS — F1190 Opioid use, unspecified, uncomplicated: Secondary | ICD-10-CM

## 2019-04-17 DIAGNOSIS — N179 Acute kidney failure, unspecified: Secondary | ICD-10-CM

## 2019-04-17 DIAGNOSIS — E739 Lactose intolerance, unspecified: Secondary | ICD-10-CM

## 2019-04-17 DIAGNOSIS — Z8679 Personal history of other diseases of the circulatory system: Secondary | ICD-10-CM

## 2019-04-17 DIAGNOSIS — R6520 Severe sepsis without septic shock: Secondary | ICD-10-CM

## 2019-04-17 DIAGNOSIS — R601 Generalized edema: Secondary | ICD-10-CM

## 2019-04-17 DIAGNOSIS — F1910 Other psychoactive substance abuse, uncomplicated: Secondary | ICD-10-CM

## 2019-04-17 DIAGNOSIS — Z881 Allergy status to other antibiotic agents status: Secondary | ICD-10-CM

## 2019-04-17 DIAGNOSIS — D696 Thrombocytopenia, unspecified: Secondary | ICD-10-CM

## 2019-04-17 DIAGNOSIS — Z8739 Personal history of other diseases of the musculoskeletal system and connective tissue: Secondary | ICD-10-CM

## 2019-04-17 DIAGNOSIS — D65 Disseminated intravascular coagulation [defibrination syndrome]: Secondary | ICD-10-CM

## 2019-04-17 DIAGNOSIS — N183 Chronic kidney disease, stage 3 (moderate): Secondary | ICD-10-CM

## 2019-04-17 DIAGNOSIS — A419 Sepsis, unspecified organism: Secondary | ICD-10-CM

## 2019-04-17 DIAGNOSIS — R7881 Bacteremia: Secondary | ICD-10-CM

## 2019-04-17 LAB — CBC WITH DIFFERENTIAL/PLATELET
Abs Immature Granulocytes: 0.12 10*3/uL — ABNORMAL HIGH (ref 0.00–0.07)
Abs Immature Granulocytes: 0.15 10*3/uL — ABNORMAL HIGH (ref 0.00–0.07)
Basophils Absolute: 0 10*3/uL (ref 0.0–0.1)
Basophils Absolute: 0 10*3/uL (ref 0.0–0.1)
Basophils Relative: 0 %
Basophils Relative: 1 %
Eosinophils Absolute: 0 10*3/uL (ref 0.0–0.5)
Eosinophils Absolute: 0 10*3/uL (ref 0.0–0.5)
Eosinophils Relative: 0 %
Eosinophils Relative: 0 %
HCT: 29.7 % — ABNORMAL LOW (ref 36.0–46.0)
HCT: 26.9 % — ABNORMAL LOW (ref 36.0–46.0)
Hemoglobin: 9.3 g/dL — ABNORMAL LOW (ref 12.0–15.0)
Hemoglobin: 8.8 g/dL — ABNORMAL LOW (ref 12.0–15.0)
Immature Granulocytes: 2 %
Immature Granulocytes: 2 %
Lymphocytes Relative: 9 %
Lymphocytes Relative: 9 %
Lymphs Abs: 0.5 10*3/uL — ABNORMAL LOW (ref 0.7–4.0)
Lymphs Abs: 0.6 10*3/uL — ABNORMAL LOW (ref 0.7–4.0)
MCH: 29.4 pg (ref 26.0–34.0)
MCH: 30.2 pg (ref 26.0–34.0)
MCHC: 31.3 g/dL (ref 30.0–36.0)
MCHC: 32.7 g/dL (ref 30.0–36.0)
MCV: 94 fL (ref 80.0–100.0)
MCV: 92.4 fL (ref 80.0–100.0)
Monocytes Absolute: 0.2 10*3/uL (ref 0.1–1.0)
Monocytes Absolute: 0.1 10*3/uL (ref 0.1–1.0)
Monocytes Relative: 3 %
Monocytes Relative: 2 %
Neutro Abs: 5 10*3/uL (ref 1.7–7.7)
Neutro Abs: 5.5 10*3/uL (ref 1.7–7.7)
Neutrophils Relative %: 86 %
Neutrophils Relative %: 86 %
Platelets: 9 10*3/uL — CL (ref 150–400)
Platelets: 7 10*3/uL — CL (ref 150–400)
RBC: 3.16 MIL/uL — ABNORMAL LOW (ref 3.87–5.11)
RBC: 2.91 MIL/uL — ABNORMAL LOW (ref 3.87–5.11)
RDW: 17.3 % — ABNORMAL HIGH (ref 11.5–15.5)
RDW: 17.2 % — ABNORMAL HIGH (ref 11.5–15.5)
Smear Review: NONE SEEN
WBC: 5.8 10*3/uL (ref 4.0–10.5)
WBC: 6.4 10*3/uL (ref 4.0–10.5)
nRBC: 0 % (ref 0.0–0.2)
nRBC: 0 % (ref 0.0–0.2)

## 2019-04-17 LAB — BLOOD CULTURE ID PANEL (REFLEXED)
Acinetobacter baumannii: NOT DETECTED
Candida albicans: NOT DETECTED
Candida glabrata: NOT DETECTED
Candida krusei: NOT DETECTED
Candida parapsilosis: NOT DETECTED
Candida tropicalis: NOT DETECTED
Carbapenem resistance: NOT DETECTED
Enterobacter cloacae complex: NOT DETECTED
Enterobacteriaceae species: DETECTED — AB
Enterococcus species: NOT DETECTED
Escherichia coli: NOT DETECTED
Haemophilus influenzae: NOT DETECTED
Klebsiella oxytoca: NOT DETECTED
Klebsiella pneumoniae: NOT DETECTED
Listeria monocytogenes: NOT DETECTED
Methicillin resistance: DETECTED — AB
Neisseria meningitidis: NOT DETECTED
Proteus species: NOT DETECTED
Pseudomonas aeruginosa: NOT DETECTED
Serratia marcescens: DETECTED — AB
Staphylococcus aureus (BCID): DETECTED — AB
Staphylococcus species: DETECTED — AB
Streptococcus agalactiae: NOT DETECTED
Streptococcus pneumoniae: NOT DETECTED
Streptococcus pyogenes: NOT DETECTED
Streptococcus species: NOT DETECTED

## 2019-04-17 LAB — HIV ANTIBODY (ROUTINE TESTING W REFLEX): HIV Screen 4th Generation wRfx: NONREACTIVE

## 2019-04-17 LAB — DIC (DISSEMINATED INTRAVASCULAR COAGULATION)PANEL
D-Dimer, Quant: 15.01 ug/mL-FEU — ABNORMAL HIGH (ref 0.00–0.50)
Fibrinogen: 141 mg/dL — ABNORMAL LOW (ref 210–475)
INR: 2.2 — ABNORMAL HIGH (ref 0.8–1.2)
Platelets: 9 10*3/uL — CL (ref 150–400)
Prothrombin Time: 24.4 seconds — ABNORMAL HIGH (ref 11.4–15.2)
Smear Review: NONE SEEN
aPTT: 51 seconds — ABNORMAL HIGH (ref 24–36)

## 2019-04-17 LAB — COMPREHENSIVE METABOLIC PANEL
ALT: 10 U/L (ref 0–44)
AST: 17 U/L (ref 15–41)
Albumin: 1.6 g/dL — ABNORMAL LOW (ref 3.5–5.0)
Alkaline Phosphatase: 61 U/L (ref 38–126)
Anion gap: 16 — ABNORMAL HIGH (ref 5–15)
BUN: 55 mg/dL — ABNORMAL HIGH (ref 6–20)
CO2: 13 mmol/L — ABNORMAL LOW (ref 22–32)
Calcium: 7.6 mg/dL — ABNORMAL LOW (ref 8.9–10.3)
Chloride: 101 mmol/L (ref 98–111)
Creatinine, Ser: 2.49 mg/dL — ABNORMAL HIGH (ref 0.44–1.00)
GFR calc Af Amer: 29 mL/min — ABNORMAL LOW (ref >60)
GFR calc non Af Amer: 25 mL/min — ABNORMAL LOW (ref >60)
Glucose, Bld: 76 mg/dL (ref 70–99)
Potassium: 3.9 mmol/L (ref 3.5–5.1)
Sodium: 130 mmol/L — ABNORMAL LOW (ref 135–145)
Total Bilirubin: 6.4 mg/dL — ABNORMAL HIGH (ref 0.3–1.2)
Total Protein: 5.2 g/dL — ABNORMAL LOW (ref 6.5–8.1)

## 2019-04-17 LAB — POCT I-STAT, CHEM 8
BUN: 71 mg/dL — ABNORMAL HIGH (ref 6–20)
Calcium, Ion: 0.78 mmol/L — CL (ref 1.15–1.40)
Chloride: 101 mmol/L (ref 98–111)
Creatinine, Ser: 2.2 mg/dL — ABNORMAL HIGH (ref 0.44–1.00)
Glucose, Bld: 73 mg/dL (ref 70–99)
HCT: 27 % — ABNORMAL LOW (ref 36.0–46.0)
Hemoglobin: 9.2 g/dL — ABNORMAL LOW (ref 12.0–15.0)
Potassium: 6.4 mmol/L (ref 3.5–5.1)
Sodium: 125 mmol/L — ABNORMAL LOW (ref 135–145)
TCO2: 17 mmol/L — ABNORMAL LOW (ref 22–32)

## 2019-04-17 LAB — RAPID URINE DRUG SCREEN, HOSP PERFORMED
Amphetamines: NOT DETECTED
Barbiturates: NOT DETECTED
Benzodiazepines: NOT DETECTED
Cocaine: NOT DETECTED
Opiates: POSITIVE — AB
Tetrahydrocannabinol: NOT DETECTED

## 2019-04-17 LAB — CREATININE, URINE, RANDOM: Creatinine, Urine: 78.03 mg/dL

## 2019-04-17 LAB — SODIUM
Sodium: 130 mmol/L — ABNORMAL LOW (ref 135–145)
Sodium: 131 mmol/L — ABNORMAL LOW (ref 135–145)

## 2019-04-17 LAB — SODIUM, URINE, RANDOM: Sodium, Ur: 46 mmol/L

## 2019-04-17 LAB — SAVE SMEAR(SSMR), FOR PROVIDER SLIDE REVIEW

## 2019-04-17 LAB — PROCALCITONIN: Procalcitonin: 46.86 ng/mL

## 2019-04-17 LAB — LACTIC ACID, PLASMA
Lactic Acid, Venous: 5.5 mmol/L (ref 0.5–1.9)
Lactic Acid, Venous: 3.9 mmol/L (ref 0.5–1.9)

## 2019-04-17 LAB — GLUCOSE, CAPILLARY: Glucose-Capillary: 73 mg/dL (ref 70–99)

## 2019-04-17 LAB — MAGNESIUM: Magnesium: 1.4 mg/dL — ABNORMAL LOW (ref 1.7–2.4)

## 2019-04-17 LAB — MRSA PCR SCREENING: MRSA by PCR: POSITIVE — AB

## 2019-04-17 MED ORDER — CHLORHEXIDINE GLUCONATE CLOTH 2 % EX PADS
6.0000 | MEDICATED_PAD | Freq: Every day | CUTANEOUS | Status: DC
  Administered 2019-04-17 – 2019-04-21 (×4): 6 via TOPICAL

## 2019-04-17 MED ORDER — PHYTONADIONE 5 MG PO TABS
10.0000 mg | ORAL_TABLET | Freq: Once | ORAL | Status: AC
  Administered 2019-04-17: 10 mg via ORAL
  Filled 2019-04-17: qty 2

## 2019-04-17 MED ORDER — LEVETIRACETAM ER 500 MG PO TB24
500.0000 mg | ORAL_TABLET | Freq: Two times a day (BID) | ORAL | Status: DC
  Administered 2019-04-17 (×2): 500 mg via ORAL
  Filled 2019-04-17 (×3): qty 1

## 2019-04-17 MED ORDER — SODIUM CHLORIDE 0.9% FLUSH
3.0000 mL | Freq: Two times a day (BID) | INTRAVENOUS | Status: DC
  Administered 2019-04-18 – 2019-04-19 (×2): 3 mL via INTRAVENOUS

## 2019-04-17 MED ORDER — SODIUM CHLORIDE 0.9% IV SOLUTION
Freq: Once | INTRAVENOUS | Status: AC
  Administered 2019-04-17: 21:00:00 via INTRAVENOUS

## 2019-04-17 MED ORDER — SODIUM CHLORIDE 0.9 % IV SOLN
250.0000 mL | INTRAVENOUS | Status: DC | PRN
  Administered 2019-04-19: 250 mL via INTRAVENOUS

## 2019-04-17 MED ORDER — SODIUM CHLORIDE 0.9% FLUSH: 3.0000 mL | INTRAVENOUS | Status: DC | PRN

## 2019-04-17 MED ORDER — PHYTONADIONE 5 MG PO TABS
10.0000 mg | ORAL_TABLET | Freq: Once | ORAL | Status: AC
  Administered 2019-04-18: 10 mg via ORAL
  Filled 2019-04-17: qty 2

## 2019-04-17 MED ORDER — ONDANSETRON HCL 4 MG/2ML IJ SOLN: 4.0000 mg | Freq: Four times a day (QID) | INTRAMUSCULAR | Status: DC | PRN

## 2019-04-17 MED ORDER — MUPIROCIN 2 % EX OINT
1.0000 "application " | TOPICAL_OINTMENT | Freq: Two times a day (BID) | CUTANEOUS | Status: DC
  Administered 2019-04-17 – 2019-04-21 (×8): 1 via NASAL
  Filled 2019-04-17 (×2): qty 22

## 2019-04-17 MED ORDER — SODIUM CHLORIDE 0.9 % IV SOLN
500.0000 mg | INTRAVENOUS | Status: DC
  Administered 2019-04-17: 500 mg via INTRAVENOUS
  Filled 2019-04-17 (×2): qty 10

## 2019-04-17 MED ORDER — RHO D IMMUNE GLOBULIN 1500 UNIT/2ML IJ SOSY
300.0000 ug | PREFILLED_SYRINGE | Freq: Once | INTRAMUSCULAR | Status: AC
  Administered 2019-04-17: 300 ug via INTRAMUSCULAR
  Filled 2019-04-17: qty 2

## 2019-04-17 MED ORDER — MAGNESIUM SULFATE 2 GM/50ML IV SOLN
2.0000 g | Freq: Once | INTRAVENOUS | Status: AC
  Administered 2019-04-17: 2 g via INTRAVENOUS
  Filled 2019-04-17: qty 50

## 2019-04-17 MED ORDER — SODIUM CHLORIDE 0.9% IV SOLUTION
Freq: Once | INTRAVENOUS | Status: AC
  Administered 2019-04-17: 02:00:00 via INTRAVENOUS

## 2019-04-17 MED ORDER — ONDANSETRON HCL 4 MG PO TABS: 4.0000 mg | ORAL_TABLET | Freq: Four times a day (QID) | ORAL | Status: DC | PRN

## 2019-04-17 MED ORDER — SODIUM CHLORIDE 0.9 % IV SOLN
INTRAVENOUS | Status: DC
  Administered 2019-04-17 (×2): via INTRAVENOUS

## 2019-04-17 MED ORDER — POLYETHYLENE GLYCOL 3350 17 G PO PACK
17.0000 g | PACK | Freq: Every day | ORAL | Status: DC | PRN
  Administered 2019-04-18: 17 g via ORAL
  Filled 2019-04-17: qty 1

## 2019-04-17 MED ORDER — DOXYCYCLINE HYCLATE 100 MG PO TABS
100.0000 mg | ORAL_TABLET | Freq: Two times a day (BID) | ORAL | Status: DC
  Administered 2019-04-17 – 2019-04-18 (×4): 100 mg via ORAL
  Filled 2019-04-17 (×4): qty 1

## 2019-04-17 MED ORDER — SODIUM CHLORIDE 0.9% FLUSH
3.0000 mL | Freq: Two times a day (BID) | INTRAVENOUS | Status: DC
  Administered 2019-04-17 – 2019-04-21 (×8): 3 mL via INTRAVENOUS

## 2019-04-17 NOTE — Progress Notes (Signed)
CRITICAL VALUE ALERT  Critical Value: lactic acid 3.9  Date & Time Notied: 04/17/19 1510  Provider Notified: Dr Tawanna Solo [id number]  Orders Received/Actions taken: awaiting orders, will continue to monitor

## 2019-04-17 NOTE — Progress Notes (Signed)
Bilateral lower extremity venous duplex completed. Refer to "CV Proc" under chart review to view preliminary results.  04/17/2019 2:00 PM Maudry Mayhew, MHA, RVT, RDCS, RDMS

## 2019-04-17 NOTE — Consult Note (Addendum)
[place]  [phone] [phone]    [place]  Referring MD: Dr. 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 She presented to the emergency room with progressive leg swelling, shortness of breath, and generalized aches and pains. In the ER, she was hypotensive and tachycardic. Work up in the ER included a CXR suggestive of multifocal pneumonia as well as cardiomegaly with generalized volume overload. Labs on admission showed a BUN 53, creatinine 2.39, albumin 1.6, bilirubin 5.4, white blood cell count was normal at 4.8, hemoglobin 8.6, and platelets were 8000.  Blood cultures were drawn.  She was started on IV antibiotics.  Blood cultures have resulted and are positive for MRSA and Serratia marcescens.  DIC panel was drawn this morning which showed a PT of 24.4, INR 2.2, PTT 51, fibrinogen that was low at 141, and elevated d-dimer at 15.01.  Prior labs have been reviewed.  She had a platelet count down to 7000 on January 22, 2019 when she was hospitalized for sepsis.  Her platelet count slowly improved during her hospitalization and normalized post hospitalization up to 206,000 on 03/14/2019.  Her baseline hemoglobin is in the 8-9 range.  When seen today, she denies bleeding.  She denies fevers or chills prior to admission.  She has been afebrile since admission.  Denies chest discomfort but reports shortness of breath.  Reports ongoing generalized weakness and swelling in her lower extremities.  Reports ongoing back pain.  The patient reports last IV drug use as 4 days ago.  However, her nurse reports that the patient stated her last IV drug use was a day and a half ago.  Hematology was asked  see the patient to make recommendations regarding her thrombocytopenia.   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)    Septic pulmonary embolism (HCC)    Tobacco abuse   :    Past Surgical History:  Procedure Laterality Date   IR FLUORO GUIDE CV LINE RIGHT  02/05/2019   IR GENERIC HISTORICAL  07/19/2016   IR LUMBAR DISC ASPIRATION W/IMG GUIDE 07/19/2016 Arne Cleveland, MD [facility]-INTERV RAD   IR REMOVAL TUN CV CATH W/O FL  03/06/2019   IR US GUIDE VASC ACCESS RIGHT  02/05/2019   RADIOLOGY WITH ANESTHESIA N/A 04/11/2018   Procedure: MRI OF LUMBAR AND THORACIC SPINE WITHOUT CONTRAST WITH ANESTHESIA;  Surgeon: Radiologist, Medication, MD;  Location: [facility];  Service: Radiology;  Laterality: N/A;   TEE WITHOUT CARDIOVERSION N/A 07/04/2013   Procedure: TRANSESOPHAGEAL ECHOCARDIOGRAM (TEE);  Surgeon: Larey Dresser, MD;  Location: [facility] ENDOSCOPY;  Service: Cardiovascular;  Laterality: N/A;   TONSILLECTOMY     33 years old  :   CURRENT MEDS: Current Facility-Administered Medications  Medication Dose Route  Frequency Provider Last Rate Last Dose   0.9 %  sodium chloride infusion (Manually program via Guardrails IV Fluids)   Intravenous Once Opyd, Ilene Qua, MD       0.9 %  sodium chloride infusion (Manually program via Guardrails IV Fluids)   Intravenous Once Adhikari, Amrit, MD       0.9 %  sodium chloride infusion  250 mL Intravenous PRN Opyd, Ilene Qua, MD       0.9 %  sodium chloride infusion   Intravenous Continuous Shelly Coss, MD 125 mL/hr at 04/17/19 1112      ceFEPIme (MAXIPIME) 1 g in sodium chloride 0.9 % 100 mL IVPB  1 g Intravenous Q24H Opyd, Ilene Qua, MD   Stopped at 04/08/2019 2234   Chlorhexidine Gluconate Cloth 2 % PADS 6 each  6 each Topical Q0600 Opyd, Ilene Qua, MD   6 each at 04/17/19 1138   DAPTOmycin (CUBICIN) 500 mg in sodium chloride 0.9 % IVPB  500 mg Intravenous Q48H Carlyle Basques, MD 220 mL/hr at 04/17/19 1215 500 mg at 04/17/19 1215   fentaNYL (SUBLIMAZE) injection 25-50 mcg  25-50 mcg Intravenous Q2H PRN Opyd, Ilene Qua, MD   50 mcg at 04/17/19 1113   mupirocin ointment (BACTROBAN) 2 % 1 application  1 application Nasal BID Opyd, Ilene Qua, MD   1 application at 40/98/11 1129   ondansetron (ZOFRAN) tablet 4 mg  4 mg Oral Q6H PRN Opyd, Ilene Qua, MD       Or   ondansetron (ZOFRAN) injection 4 mg  4 mg Intravenous Q6H PRN Opyd, Ilene Qua, MD       polyethylene glycol (MIRALAX / GLYCOLAX) packet 17 g  17 g Oral Daily PRN Opyd, Ilene Qua, MD       rho (d) immune globulin (RHIG/RHOPHYLAC) injection 300 mcg  300 mcg Intramuscular Once Kirby-Graham, Karsten Fells, NP       sodium chloride flush (NS) 0.9 % injection 3 mL  3 mL Intravenous Q12H Opyd, Ilene Qua, MD       sodium chloride flush (NS) 0.9 % injection 3 mL  3 mL Intravenous Q12H Opyd, Ilene Qua, MD   3 mL at 04/17/19 1138   sodium chloride flush (NS) 0.9 % injection 3 mL  3 mL Intravenous PRN Opyd, Ilene Qua, MD          Allergies  Allergen Reactions   Lactose Intolerance (Gi)    Vancomycin Other (See Comments)    Possible contributor to AKI and thrombocytopenia  :  Family History  Problem Relation Age of Onset   Alcoholism Father   :  Social History   Socioeconomic History   Marital status: Single    Spouse name: Not on file   Number of children: Not on file   Years of education: Not on file   Highest education level: Not on file  Occupational History   Not on file  Social Needs   Financial resource strain: Not on file   Food  insecurity    Worry: Not on file    Inability: Not on file   Transportation needs    Medical: Not on file    Non-medical: Not on file  Tobacco Use   Smoking status: Current Every Day Smoker    Packs/day: 0.50    Years: 7.00    Pack years: 3.50    Types: Cigarettes   Smokeless tobacco: Never Used   Tobacco comment: slowing down  Substance and Sexual Activity  Alcohol use: No   Drug use: Yes    Types: IV, Oxycodone    Comment: heroin   Sexual activity: Yes    Partners: Male    Birth control/protection: I.U.D.  Lifestyle   Physical activity    Days per week: Not on file    Minutes per session: Not on file   Stress: Not on file  Relationships   Social connections    Talks on phone: Not on file    Gets together: Not on file    Attends religious service: Not on file    Active member of club or organization: Not on file    Attends meetings of clubs or organizations: Not on file    Relationship status: Not on file   Intimate partner violence    Fear of current or ex partner: Not on file    Emotionally abused: Not on file    Physically abused: Not on file    Forced sexual activity: Not on file  Other Topics Concern   Not on file  Social History Narrative   Not on file  :  REVIEW OF SYSTEMS: A comprehensive 14 point review of systems was negative except as noted in the HPI.  Exam: Patient Vitals for the past 24 hrs:  BP Temp Temp src Pulse Resp SpO2 Height Weight  04/17/19 1136 -- 97.6 F (36.4 C) Axillary -- -- -- -- --  04/17/19 0400 96/74 97.8 F (36.6 C) Oral -- (!) 25 97 % -- --  04/17/19 0201 98/75 97.7 F (36.5 C) Oral -- (!) 26 94 % -- --  04/17/19 0142 95/69 98 F (36.7 C) Oral -- (!) 28 99 % -- --  04/17/19 0032 99/76 98.2 F (36.8 C) Oral (!) 120 -- 93 % 5\' 1"  (1.549 m) 113 lb 15.7 oz (51.7 kg)  03/30/2019 2315 100/71 -- -- (!) 120 (!) 64 95 % -- --  03/20/2019 2256 -- -- -- (!) 119 (!) 61 96 % -- --  04/05/2019 2245 92/76 -- -- (!) 119 (!) 54  97 % -- --  03/25/2019 2230 94/74 -- -- (!) 119 (!) 62 97 % -- --  04/07/2019 2200 97/75 -- -- (!) 118 (!) 56 97 % -- --  04/08/2019 2145 94/76 -- -- (!) 119 (!) 53 92 % -- --  04/08/2019 2130 102/77 -- -- (!) 122 (!) 52 92 % -- --  04/07/2019 2115 103/77 -- -- (!) 122 (!) 46 93 % -- --  04/15/2019 2100 101/81 -- -- (!) 123 (!) 59 93 % -- --  03/24/2019 2030 103/78 -- -- (!) 124 (!) 56 95 % -- --  04/02/2019 2015 101/79 -- -- (!) 123 (!) 49 98 % -- --  04/14/2019 2000 100/81 -- -- (!) 122 (!) 52 97 % -- --  04/14/2019 1921 -- -- -- (!) 124 (!) 45 94 % -- --  03/29/2019 1920 97/77 -- -- (!) 124 (!) 46 92 % -- --  04/13/2019 1842 -- -- -- -- -- -- 5\' 1"  (1.549 m) 108 lb (49 kg)  04/08/2019 1826 (!) 118/92 98.2 F (36.8 C) Oral (!) 129 16 92 % -- --    General:  Chronically ill appearing female. Moans when examined.   Eyes:  Scleral icterus noted.   ENT:  There were no oropharyngeal lesions.   Neck was without thyromegaly.   Lymphatics:  Negative cervical, supraclavicular or axillary adenopathy.    Cardiovascular:   Edema noted in all  extremities.   GI:  abdomen was soft, spleen palpable.   Musculoskeletal: Decreased strength in the upper and lower extremities.   Skin exam showed a few petechiae on her lower extremities.   Neuro exam was nonfocal.  Patient was alert and oriented. Speech was not pressured.  Thought content was not tangential.    LABS:  Lab Results  Component Value Date   WBC 5.8 04/17/2019   HGB 9.3 (L) 04/17/2019   HCT 29.7 (L) 04/17/2019   PLT 9 (LL) 04/17/2019   PLT 9 (LL) 04/17/2019   GLUCOSE 76 04/17/2019   TRIG 56 02/04/2019   ALT 10 04/17/2019   AST 17 04/17/2019   NA 130 (L) 04/17/2019   K 3.9 04/17/2019   CL 101 04/17/2019   CREATININE 2.49 (H) 04/17/2019   BUN 55 (H) 04/17/2019   CO2 13 (L) 04/17/2019   INR 2.2 (H) 04/17/2019    Mr Lumbar Spine Wo Contrast  Result Date: 04/05/2019 CLINICAL DATA:  Bacteremia, history of epidural abscess EXAM: MRI LUMBAR SPINE WITHOUT  CONTRAST TECHNIQUE: Multiplanar, multisequence MR imaging of the lumbar spine was performed. No intravenous contrast was administered. COMPARISON:  None. FINDINGS: Segmentation:  Standard. Alignment: No static listhesis. Severe dextroscoliosis of the thoracolumbar spine. Vertebrae: No fracture, evidence of acute discitis, or bone lesion. Old healed discitis at L1-2. Significant interval improvement in discitis at T12-L1 with resolution of the paravertebral and intra discal fluid with mild residual marrow edema along the anterior endplates which is likely reactive. Conus medullaris and cauda equina: Conus extends to the T12 level. Conus and cauda equina appear normal. Paraspinal and other soft tissues: No acute paraspinal abnormality. Diffuse paraspinal muscle atrophy. Disc levels: Disc spaces: Degenerative disc disease with severe disc height loss at L1-2 consistent with he will discitis. Mild degenerative disc disease with disc height loss and reactive endplate edema at [id number]-[id number]. T12-L1: Minimal broad-based disc bulge. No evidence of neural foraminal stenosis. No central canal stenosis. L1-L2: No significant disc bulge. No evidence of neural foraminal stenosis. No central canal stenosis. L2-L3: No significant disc bulge. No evidence of neural foraminal stenosis. No central canal stenosis. L3-L4: No significant disc bulge. No evidence of neural foraminal stenosis. No central canal stenosis. Mild right facet arthropathy. L4-L5: Mild broad-based disc bulge. Mild bilateral facet arthropathy. No evidence of neural foraminal stenosis. No central canal stenosis. L5-S1: No significant disc bulge. No evidence of neural foraminal stenosis. No central canal stenosis. IMPRESSION: 1. Old healed discitis at L1-2. Significant interval improvement in discitis at T12-L1 with resolution of the paravertebral and intradiscal fluid with mild residual marrow edema along the anterior endplates which is likely reactive. No evidence of acute  discitis-osteomyelitis of the lumbar spine. Electronically Signed   By: Kathreen Devoid   On: 04/05/2019 12:45   US Abdomen Complete  Result Date: 04/17/2019 CLINICAL DATA:  Acute renal liver failure.  IV drug user EXAM: ABDOMEN ULTRASOUND COMPLETE COMPARISON:  None. FINDINGS: Gallbladder: There is sludge in the gallbladder. The gallbladder wall thickness measures 3 mm which is borderline. No Murphy's sign. There is ascites in the, some of which abuts gallbladder. No gallstones noted. Common bile duct: Diameter: 5.6 mm Liver: No focal lesion identified. Within normal limits in parenchymal echogenicity. Portal vein is patent on color Doppler imaging with normal direction of blood flow towards the liver. IVC: No abnormality visualized. Pancreas: Visualized portion unremarkable. Spleen: The spleen measures 19.52 cm in length with a volume 1120 cc consistent with splenomegaly. Right Kidney: Length:  11.8 cm. Increased echogenicity. Mild pelviectasis. Left Kidney: Length: 11.9 cm. Increased echogenicity. Mild pelviectasis. Abdominal aorta: No aneurysm visualized. Other findings: Pleural effusions and ascites identified IMPRESSION: 1. There is sludge in the gallbladder. The gallbladder wall is borderline in thickness. No Murphy's sign or stones. There is ascites in the abdomen, some of which abuts the gallbladder. If there is concern for acute cholecystitis, recommend a HIDA scan. 2. Splenomegaly. 3. Increased echogenicity in both kidneys consistent with history of chronic renal disease. There is mild bilateral pelviectasis without gross hydronephrosis. 4. Pleural effusions and ascites. Electronically Signed   By: Dorise Bullion III M.D   On: 04/17/2019 10:59   Dg Chest Port 1 View  Result Date: 03/29/2019 CLINICAL DATA:  Shortness of breath EXAM: PORTABLE CHEST 1 VIEW COMPARISON:  Feb 19, 2019 FINDINGS: The previously noted tunneled central venous catheter, tracheostomy tube, and enteric tube have all been removed.  The heart size is enlarged. There are multifocal airspace opacities bilaterally. There is volume overload. There are small bilateral pleural effusions, right greater than left. There is no pneumothorax. There is no acute osseous abnormality. There appear to be some old right-sided rib fractures. IMPRESSION: 1. Multifocal airspace opacities bilaterally concerning for multifocal pneumonia (viral or bacterial). 2. Cardiomegaly with generalized volume overload. 3. Small bilateral pleural effusions are noted. Electronically Signed   By: Constance Holster M.D.   On: 03/27/2019 19:08   Peripheral Blood Smear: Increased polys and bands.  Neutrophils with toxic granulations.  No blasts.  Markedly decreased platelets, large platelets present.  No platelet clumping.  Numerous burr cells noted.  Teardrops and ovalocytes present.  Rare schistocytes.  Polychromasia not increased.  ASSESSMENT AND PLAN:   1. Thrombocytopenia secondary to DIC and sepsis 2.  Bacteremia in the setting of IV drug abuse with a past medical history including bacteremia and endocarditis.  Blood cultures positive for MRSA and Serratia marcescens. 3.  Hyperbilirubinemia secondary to hemolysis 4.  Acute kidney injury superimposed on chronic kidney disease 5.  Anemia secondary to bacteremia, DIC, chronic disease, and renal failure 6.  Splenomegaly-chronic 7.  Tricuspid endocarditis with tricuspid regurgitation 8.  Anasarca  Ms. Jarnagin has severe thrombocytopenia likely due to DIC and sepsis.  Peripheral smear was reviewed and consistent with this.  The patient has bacteremia with positive blood cultures.  1.  Treat underlying infection.  Infectious disease is following this patient. 2.  She is already received 1 dose of vitamin K and will administer another dose this evening and a third dose tomorrow morning. 3.  Recommend platelet transfusion for platelet count less than 10,000 or active bleeding. 4.  Recommend FFP/cryoprecipitate if  active bleeding or if fibrinogen is less than 100.  We will repeat a DIC panel in the morning. 5.  We will check reticulocyte count in the morning. 6.  Anemia is likely multifactorial related to her acute illness, chronic kidney disease, and hemolysis.  Her hemoglobin is consistent with baseline.  No transfusion is indicated today.   The patient's mother was on the telephone at the time of our visit.  She was updated regarding our plan.  Thank you for this referral.  Mikey Bussing, DNP, AGPCNP-BC, AOCNP Ms. Royall was interviewed and examined.  I reviewed the peripheral blood smear. She is admitted with polymicrobial bacteremia in the setting of ongoing IV drug abuse and a history of endocarditis.  She has acute kidney injury.  She has severe thrombocytopenia and coagulopathy consistent with DIC.  The hyperbilirubinemia is  likely secondary to hemolysis and a hepatic effect from sepsis.  The thrombocytopenia and coagulopathy should respond to treatment of the bacteremia.  We will administer vitamin K and provide transfusion support as indicated.  There is no apparent bleeding at present.  We will repeat a CBC and DIC panel in the a.m. on 04/18/2019.

## 2019-04-17 NOTE — Progress Notes (Signed)
Pharmacy Antibiotic Note  Molly [name] is a 33 y.o. female admitted on 03/26/2019 with MRSA and serratia bacteremia. Noted that patient has a recent history of MSSA TV endocarditis in April of 2020.  Pharmacy has been consulted for daptomycin dosing due to acute AKI. Current SCr- 2.39 with CrCl ~ 24 ml/min.    Plan: Daptomycin 500 mg (~ 10 mg/kg) every 48 hours Weekly CK starting 7/1 and every Wednesday  Monitor cultures, renal function and CKs  Height: 5\' 1"  (154.9 cm) Weight: 113 lb 15.7 oz (51.7 kg) IBW/kg (Calculated) : 47.8  Temp (24hrs), Avg:98 F (36.7 C), Min:97.7 F (36.5 C), Max:98.2 F (36.8 C)  Recent Labs  Lab 03/28/2019 2039 03/29/2019 2050 04/13/2019 2053 04/17/19 0721 04/17/19 0742  WBC  --   --  4.8 5.8  --   CREATININE 2.20*  --  2.39*  --   --   LATICACIDVEN  --  2.5*  --   --  5.5*    Estimated Creatinine Clearance: 25.7 mL/min (A) (by C-G formula based on SCr of 2.39 mg/dL (H)).    Allergies  Allergen Reactions  . Lactose Intolerance (Gi)   . Vancomycin Other (See Comments)    Possible contributor to AKI and thrombocytopenia    Antimicrobials this admission: Linezolid x 1  6/29 Cefepime >> 6/30 Dapto>>   Microbiology results: 6/29 BCx: MRSA and serratia in 3/4 blood cultures   Thank you for allowing pharmacy to be a part of this patient's care.  Jimmy Footman, PharmD, BCPS, BCIDP Infectious Diseases Clinical Pharmacist Phone: [phone] 04/17/2019 8:59 AM

## 2019-04-17 NOTE — Consult Note (Signed)
[facility] Nurse wound consult note Reason for Consult: Healing full thickness wound in mid-thoracic area, healing stage 2 pressure injury to coccyx Wound type: Full thickness, pressure plus moisture Pressure Injury POA: Yes Measurement: Mid-thoracic:  0.4cm round x 0.1cm with thin layer of epithelialization occurring, no drainage Coccyx: 0.5cm x 0.2cm x 0.1cm with pink, moist wound bed, scant serous exudate. Wound bed:As described above Drainage (amount, consistency, odor) As described above Periwound:intact, dry Dressing procedure/placement/frequency: Nursing has already placed silicone foam dressings over the two affected areas and I concur with that POC.  Patient would benefit from being assisted into a side lying position while in bed as tolerated.  [facility] nursing team will not follow, but will remain available to this patient, the nursing and medical teams.  Please re-consult if needed. Thanks, Maudie Flakes, MSN, RN, [place], [facility]  Pager# [phone]

## 2019-04-17 NOTE — Progress Notes (Signed)
CRITICAL VALUE ALERT  Critical Value: lactic acid 5.5  Date & Time Notied: 04/17/19 0815  Provider Notified: Dr Tawanna Solo, [id number]  Orders Received/Actions taken: NS 163mL ordered

## 2019-04-17 NOTE — Progress Notes (Signed)
PROGRESS NOTE    Molly [name]  GYB:[id number] DOB: 02-11-1986 DOA: 04/15/2019 PCP: Patient, No Pcp Per   Brief Narrative: Patient is a 33 year old female with history of IV drug abuse, MRSA tricuspid valve endocarditis, history of epidural abscess, chronic renal insufficiency, seizures, noncompliance who presents to the emergency department for the evaluation of progressive leg swelling, shortness of breath and generalized body aches and pains.  On presentation, she was found to be afebrile, tachycardic, tachypneic and hypotensive.  Chest x-ray was concerning for multifocal airspace opacities bilaterally suggesting multifocal pneumonia as well as cardiomegaly and volume overload.  She was found to have hyponatremia, hypokalemia, acute kidney injury.  New thrombocytopenia with platelets of 8000, anemia, elevated lactic acid and elevated INR. Blood cultures have been sent which has shown MRSA and Serratia.  She has been started on broad-spectrum antibiotics.  ID already consulted. She has picture of DIC most likely associated with sepsis.  I have requested for hematology consultation.  Also requested consult for PCCM today.  Assessment & Plan:   Principal Problem:   Sepsis with multiple organ dysfunction (MOD) (HCC) Active Problems:   Acute renal failure superimposed on stage 2 chronic kidney disease (HCC)   Anasarca   Polysubstance abuse (HCC)   Hyponatremia   Thrombocytopenia (HCC)   Sepsis with disseminated intravascular coagulopathy (DIC) ([place])   Sepsis with multi-organ dysfunction (HCC)   Seizure ([place])   DIC: Has severe thrombocytopenia (new), elevated Ddimer,elevated INR, low fibrinogen, elevated APTT.  We will give her a dose of vitamin K.  Will transfuse her with 1 unit of platelets.  Hematology will follow. Her DIC is associated with sepsis.  Thrombocytopenia: Continue platelet transfusion if platelets less than 10,000.  Bacteremia: Blood cultures growing MRSA and Serratia.   Continue current broad-spectrum antibiotics.  Follow-up final blood culture report.  ID following.  Severe sepsis: Presented with hypotension, elevated lactate level, leukocytosis.  Continue fluids, IV antibiotics.  Follow-up cultures  Multifocal pneumonia:  Continue current antibiotics.  Monitor respiratory status closely.  COVID-19 screening is negative.  Recent history of epidural abscess/discitis/back pain: Recent MRI negative for acute findings.  AKI on CKD stage II: Continue IV fluids for now.  Hold Lasix at home.  Has edema in the lower extremities.  Hyperbilirubinemia: Most likely secondary to hemolysis.  Normal transaminases and alkaline phosphatase.  Hyponatremia: Continue IV fluids  Hypokalemia/hypomagnesemia: Supplemented.  Anasarca: Multifactorial.  Presented with bilateral lower extremity edema.  Bibasilar with hypoalbuminemia.  She is on Lasix at home.  But due to current sepsis,continue IV fluids. Will check bilateral lower extremity Doppler for DVTs.  IV drug abuse: Injects fentanyl/heroin.  Last intake day before  Yesterday.  Pressure ulcers: Stage I-II sacral ulcer.  Wound care Following           DVT prophylaxis:SCD Code Status: Full Family Communication: Called mother,father twice.Call not received Disposition Plan: Undetermined at this point   Consultants: PCCM, hematology, ID  Procedures: None  Antimicrobials:  Anti-infectives (From admission, onward)   Start     Dose/Rate Route Frequency Ordered Stop   04/17/19 1200  DAPTOmycin (CUBICIN) 500 mg in sodium chloride 0.9 % IVPB     500 mg 220 mL/hr over 30 Minutes Intravenous Every 48 hours 04/17/19 0847     03/20/2019 2115  ceFEPIme (MAXIPIME) 1 g in sodium chloride 0.9 % 100 mL IVPB     1 g 200 mL/hr over 30 Minutes Intravenous Every 24 hours 03/25/2019 2100     04/05/2019 2115  linezolid (ZYVOX) IVPB 600 mg     600 mg 300 mL/hr over 60 Minutes Intravenous Once 03/21/2019 2100 04/13/2019 2344       Subjective: Patient seen and examined at bedside this morning.  Blood pressure soft.  Complains of severe back pain.  Alert and oriented.  Denies any chest pain, shortness of breath or abdominal pain.  Objective: Vitals:   04/17/19 0032 04/17/19 0142 04/17/19 0201 04/17/19 0400  BP: 99/76 95/69 98/75  96/74  Pulse: (!) 120     Resp:  (!) 28 (!) 26 (!) 25  Temp: 98.2 F (36.8 C) 98 F (36.7 C) 97.7 F (36.5 C) 97.8 F (36.6 C)  TempSrc: Oral Oral Oral Oral  SpO2: 93% 99% 94% 97%  Weight: 51.7 kg     Height: 5\' 1"  (1.549 m)       Intake/Output Summary (Last 24 hours) at 04/17/2019 0935 Last data filed at 04/17/2019 0400 Gross per 24 hour  Intake 627.33 ml  Output 150 ml  Net 477.33 ml   Filed Weights   03/20/2019 1842 04/17/19 0032  Weight: 49 kg 51.7 kg    Examination:  General exam: Weak, chronically ill looking HEENT:PERRL,Oral mucosa moist, Ear/Nose normal on gross exam Respiratory system: Bilateral equal air entry, normal vesicular breath sounds, no wheezes or crackles  Cardiovascular system: S1 & S2 heard, RRR. No JVD, murmurs, rubs, gallops or clicks. Severe bilateral lower extremity edema. Gastrointestinal system: Abdomen is mildly distended, soft and nontender. No organomegaly or masses felt. Normal bowel sounds heard. Central nervous system: Alert and oriented. No focal neurological deficits. Extremities: Bilateral lower extremity edema, no clubbing ,no cyanosis, distal peripheral pulses palpable. Skin: icterus,pressure ulcers on the   Data Reviewed: I have personally reviewed following labs and imaging studies  CBC: Recent Labs  Lab 03/27/2019 2039 03/23/2019 2053 04/17/19 0721  WBC  --  4.8 5.8  NEUTROABS  --  3.9 5.0  HGB 9.2* 8.6* 9.3*  HCT 27.0* 27.2* 29.7*  MCV  --  93.5 94.0  PLT  --  8* 9*  9*   Basic Metabolic Panel: Recent Labs  Lab 04/13/2019 2039 04/12/2019 2053 04/17/19 0721  NA 125* 127* 130*  K 6.4* 2.8* 3.9  CL 101 97* 101  CO2  --  16*  13*  GLUCOSE 73 79 76  BUN 71* 53* 55*  CREATININE 2.20* 2.39* 2.49*  CALCIUM  --  7.3* 7.6*  MG  --   --  1.4*   GFR: Estimated Creatinine Clearance: 24.7 mL/min (A) (by C-G formula based on SCr of 2.49 mg/dL (H)). Liver Function Tests: Recent Labs  Lab 04/14/2019 2053 04/17/19 0721  AST 21 17  ALT 8 10  ALKPHOS 80 61  BILITOT 5.4* 6.4*  PROT 5.2* 5.2*  ALBUMIN 1.6* 1.6*   No results for input(s): LIPASE, AMYLASE in the last 168 hours. No results for input(s): AMMONIA in the last 168 hours. Coagulation Profile: Recent Labs  Lab 03/22/2019 2053 04/17/19 0721  INR 1.8* 2.2*   Cardiac Enzymes: No results for input(s): CKTOTAL, CKMB, CKMBINDEX, TROPONINI in the last 168 hours. BNP (last 3 results) No results for input(s): PROBNP in the last 8760 hours. HbA1C: No results for input(s): HGBA1C in the last 72 hours. CBG: Recent Labs  Lab 04/17/19 0753  GLUCAP 73   Lipid Profile: No results for input(s): CHOL, HDL, LDLCALC, TRIG, CHOLHDL, LDLDIRECT in the last 72 hours. Thyroid Function Tests: No results for input(s): TSH, T4TOTAL, FREET4, T3FREE, THYROIDAB in  the last 72 hours. Anemia Panel: No results for input(s): VITAMINB12, FOLATE, FERRITIN, TIBC, IRON, RETICCTPCT in the last 72 hours. Sepsis Labs: Recent Labs  Lab 03/20/2019 2050 04/17/19 0742  LATICACIDVEN 2.5* 5.5*    Recent Results (from the past 240 hour(s))  Blood Culture (routine x 2)     Status: None (Preliminary result)   Collection Time: 03/28/2019  6:54 PM   Specimen: BLOOD RIGHT ARM  Result Value Ref Range Status   Specimen Description BLOOD RIGHT ARM  Final   Special Requests   Final    BOTTLES DRAWN AEROBIC AND ANAEROBIC Blood Culture results may not be optimal due to an inadequate volume of blood received in culture bottles   Culture  Setup Time   Final    GRAM POSITIVE COCCI IN CLUSTERS GRAM NEGATIVE RODS IN BOTH AEROBIC AND ANAEROBIC BOTTLES Organism ID to follow CRITICAL RESULT CALLED TO,  READ BACK BY AND VERIFIED WITH: Dala Dock SINCLAIR [id number] MLM Performed at [facility], [place] [address] [place], [place] [address]    Culture GRAM POSITIVE COCCI GRAM NEGATIVE RODS   Final   Report Status PENDING  Incomplete  Blood Culture ID Panel (Reflexed)     Status: Abnormal   Collection Time: 03/29/2019  6:54 PM  Result Value Ref Range Status   Enterococcus species NOT DETECTED NOT DETECTED Final   Listeria monocytogenes NOT DETECTED NOT DETECTED Final   Staphylococcus species DETECTED (A) NOT DETECTED Final    Comment: PHARMD E SINCLAIR [id number] 0830 MLM   Staphylococcus aureus (BCID) DETECTED (A) NOT DETECTED Final    Comment: Methicillin (oxacillin)-resistant Staphylococcus aureus (MRSA). MRSA is predictably resistant to beta-lactam antibiotics (except ceftaroline). Preferred therapy is vancomycin unless clinically contraindicated. Patient requires contact precautions if  hospitalized. PHARMD E SINCLAIR [id number] 0830 MLM    Methicillin resistance DETECTED (A) NOT DETECTED Final    Comment: CRITICAL RESULT CALLED TO, READ BACK BY AND VERIFIED WITH: PHARMD E SINCLAIR [id number] 0830 MLM    Streptococcus species NOT DETECTED NOT DETECTED Final   Streptococcus agalactiae NOT DETECTED NOT DETECTED Final   Streptococcus pneumoniae NOT DETECTED NOT DETECTED Final   Streptococcus pyogenes NOT DETECTED NOT DETECTED Final   Acinetobacter baumannii NOT DETECTED NOT DETECTED Final   Enterobacteriaceae species DETECTED (A) NOT DETECTED Final    Comment: Enterobacteriaceae represent a large family of gram-negative bacteria, not a single organism. PHARMD E SINCLAIR [id number] 0830 MLM    Enterobacter cloacae complex NOT DETECTED NOT DETECTED Final   Escherichia coli NOT DETECTED NOT DETECTED Final   Klebsiella oxytoca NOT DETECTED NOT DETECTED Final   Klebsiella pneumoniae NOT DETECTED NOT DETECTED Final   Proteus species NOT DETECTED NOT DETECTED Final   Serratia marcescens DETECTED (A)  NOT DETECTED Final    Comment: PHARMD E SINCLAIR [id number] 0830 MLM   Carbapenem resistance NOT DETECTED NOT DETECTED Final   Haemophilus influenzae NOT DETECTED NOT DETECTED Final   Neisseria meningitidis NOT DETECTED NOT DETECTED Final   Pseudomonas aeruginosa NOT DETECTED NOT DETECTED Final   Candida albicans NOT DETECTED NOT DETECTED Final   Candida glabrata NOT DETECTED NOT DETECTED Final   Candida krusei NOT DETECTED NOT DETECTED Final   Candida parapsilosis NOT DETECTED NOT DETECTED Final   Candida tropicalis NOT DETECTED NOT DETECTED Final    Comment: Performed at [facility], [place] [address] [place], [place] [address]  SARS Coronavirus 2 (CEPHEID - Performed in [facility] hospital lab), [facility]  Status: None   Collection Time: 03/24/2019  7:18 PM   Specimen: Nasopharyngeal Swab  Result Value Ref Range Status   SARS Coronavirus 2 NEGATIVE NEGATIVE Final    Comment: (NOTE) If result is NEGATIVE SARS-CoV-2 target nucleic acids are NOT DETECTED. The SARS-CoV-2 RNA is generally detectable in upper and lower  respiratory specimens during the acute phase of infection. The lowest  concentration of SARS-CoV-2 viral copies this assay can detect is 250  copies / mL. A negative result does not preclude SARS-CoV-2 infection  and should not be used as the sole basis for treatment or other  patient management decisions.  A negative result may occur with  improper specimen collection / handling, submission of specimen other  than nasopharyngeal swab, presence of viral mutation(s) within the  areas targeted by this assay, and inadequate number of viral copies  (<250 copies / mL). A negative result must be combined with clinical  observations, patient history, and epidemiological information. If result is POSITIVE SARS-CoV-2 target nucleic acids are DETECTED. The SARS-CoV-2 RNA is generally detectable in upper and lower  respiratory specimens dur ing the acute phase of infection.   Positive  results are indicative of active infection with SARS-CoV-2.  Clinical  correlation with patient history and other diagnostic information is  necessary to determine patient infection status.  Positive results do  not rule out bacterial infection or co-infection with other viruses. If result is PRESUMPTIVE POSTIVE SARS-CoV-2 nucleic acids MAY BE PRESENT.   A presumptive positive result was obtained on the submitted specimen  and confirmed on repeat testing.  While 2019 novel coronavirus  (SARS-CoV-2) nucleic acids may be present in the submitted sample  additional confirmatory testing may be necessary for epidemiological  and / or clinical management purposes  to differentiate between  SARS-CoV-2 and other Sarbecovirus currently known to infect humans.  If clinically indicated additional testing with an alternate test  methodology [phone]) is advised. The SARS-CoV-2 RNA is generally  detectable in upper and lower respiratory sp ecimens during the acute  phase of infection. The expected result is Negative. Fact Sheet for Patients:  StrictlyIdeas.no Fact Sheet for Healthcare Providers: BankingDealers.co.za This test is not yet approved or cleared by the [place] FDA and has been authorized for detection and/or diagnosis of SARS-CoV-2 by FDA under an Emergency Use Authorization (EUA).  This EUA will remain in effect (meaning this test can be used) for the duration of the COVID-19 declaration under Section 564(b)(1) of the Act, 21 U.S.C. section 360bbb-3(b)(1), unless the authorization is terminated or revoked sooner. Performed at [facility], [place] [address] [place], [place] [address]   Blood Culture (routine x 2)     Status: None (Preliminary result)   Collection Time: 03/19/2019  8:53 PM   Specimen: BLOOD RIGHT FOREARM  Result Value Ref Range Status   Specimen Description BLOOD RIGHT FOREARM  Final   Special Requests    Final    BOTTLES DRAWN AEROBIC ONLY Blood Culture results may not be optimal due to an inadequate volume of blood received in culture bottles   Culture  Setup Time   Final    GRAM POSITIVE COCCI GRAM NEGATIVE RODS AEROBIC BOTTLE ONLY Performed at [facility], [place] [address], [place], [place] [address]    Culture GRAM POSITIVE COCCI GRAM NEGATIVE RODS   Final   Report Status PENDING  Incomplete  MRSA PCR Screening     Status: Abnormal   Collection Time: 04/17/19  1:10 AM   Specimen: Nasal Mucosa; Nasopharyngeal  Result Value Ref Range Status   MRSA by PCR POSITIVE (A) NEGATIVE Final    Comment:        The GeneXpert MRSA Assay (FDA approved for NASAL specimens only), is one component of a comprehensive MRSA colonization surveillance program. It is not intended to diagnose MRSA infection nor to guide or monitor treatment for MRSA infections. RESULT CALLED TO, READ BACK BY AND VERIFIED WITH: BUENDIA,A RN [phone] 04/17/2019 MITCHELL,L Performed at [facility], [place] [address], [place], [place] [address]          Radiology Studies: Dg Chest Port 1 View  Result Date: 03/21/2019 CLINICAL DATA:  Shortness of breath EXAM: PORTABLE CHEST 1 VIEW COMPARISON:  Feb 19, 2019 FINDINGS: The previously noted tunneled central venous catheter, tracheostomy tube, and enteric tube have all been removed. The heart size is enlarged. There are multifocal airspace opacities bilaterally. There is volume overload. There are small bilateral pleural effusions, right greater than left. There is no pneumothorax. There is no acute osseous abnormality. There appear to be some old right-sided rib fractures. IMPRESSION: 1. Multifocal airspace opacities bilaterally concerning for multifocal pneumonia (viral or bacterial). 2. Cardiomegaly with generalized volume overload. 3. Small bilateral pleural effusions are noted. Electronically Signed   By: Constance Holster M.D.   On: 04/11/2019 19:08         Scheduled Meds: . sodium chloride   Intravenous Once  . sodium chloride   Intravenous Once  . Chlorhexidine Gluconate Cloth  6 each Topical Q0600  . furosemide  40 mg Intravenous Q12H  . levETIRAcetam  500 mg Oral Q12H  . mupirocin ointment  1 application Nasal BID  . phytonadione  10 mg Oral Once  . rho (d) immune globulin  300 mcg Intramuscular Once  . sodium chloride flush  3 mL Intravenous Q12H  . sodium chloride flush  3 mL Intravenous Q12H   Continuous Infusions: . sodium chloride    . sodium chloride    . ceFEPime (MAXIPIME) IV Stopped (04/08/2019 2234)  . DAPTOmycin (CUBICIN)  IV    . magnesium sulfate bolus IVPB       LOS: 1 day    Time spent: 55 mins.More than 50% of that time was spent in counseling and/or coordination of care.      Shelly Coss, MD [facility] Pager [phone]  If 7PM-7AM, please contact night-coverage [email or web address] Password [email or web address] 04/17/2019, 9:35 AM

## 2019-04-17 NOTE — Consult Note (Signed)
[facility]    Date of Admission:  04/08/2019     Total days of antibiotics 2               Reason for Consult: MRSA / Serratia Bacteremia  Referring Provider: Tivis Ringer / [place] Primary Care Provider: Patient, No Pcp Per   Assessment/Plan:   Molly [name] is a sickly 33 year old female admitted with weakness, whole body pain and decreased ability to walk found to have sepsis complicated by polymicrobial bacteremia with MRSA and Serratia marcescens, DIC and . Relapsed with drug use with heroin prior to admission following recent treatment for TV endocarditis complicated by MSSA bacteremia, acute kidney injury and need for tracheotomy. Hematology recommended FFP and DIC/thrombocytopenia likely related to sepsis.   Polymicrobial Bacteremia - Repeat blood cultures to ensure clearance of bacteremia. She has no central lines or external sources at present. Will need TTE to check tricuspid valve function with significant history of endocarditis. Continue current dose of daptomycin and cefepime. Will add doxycycline for gram positive lung coverage as daptomycin is not as effective in the lungs.  DIC - Sepsis related with new onset thrombocytopenia, elevated INR and low fibrinogen.  Thrombocytopenia - Related to DIC and sepsis. Hematology following and recommends platelet transfusion for <10,000.  Anasarca - Ongoing since previous office visit and likely multifactorial including renal and cardiovascular origin. Continue management per primary team.  Acute Kidney Injury on CKD Stage III: Elevated creatinine above baseline. Primary team providing IV fluids and hold Lasix. Daptomycin chosen to reduce risk of vancomycin nephrotoxicity.      Principal Problem:   Sepsis with multiple organ dysfunction (MOD) (HCC) Active Problems:   Acute renal failure superimposed on stage 2 chronic kidney disease (HCC)   Anasarca   Polysubstance abuse ([place])   Hyponatremia  Thrombocytopenia (HCC)   Sepsis with disseminated intravascular coagulopathy (DIC) ([place])   Sepsis with multi-organ dysfunction ([place])   Seizure ([place])   . sodium chloride   Intravenous Once  . sodium chloride   Intravenous Once  . Chlorhexidine Gluconate Cloth  6 each Topical Q0600  . mupirocin ointment  1 application Nasal BID  . rho (d) immune globulin  300 mcg Intramuscular Once  . sodium chloride flush  3 mL Intravenous Q12H  . sodium chloride flush  3 mL Intravenous Q12H     HPI: Molly [name] is a 33 y.o. 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 She noted heroin usage 1 day prior to arrival.  Afebrile in in the ED and tachycardic. Bilateral lower extremity edema present. Lab work with lactic acid of 2.5, sodium 127, potassium 2.8, creatinine 2.39 (baseline ), calcium 7.3, platelets 8 and hemoglobin 8.6. Sars-Cov-2 negative. Chest x-ray with multifocal airspace opacities bilaterally concerning for multifocal pneumonia. Most recent MRI on 04/04/19 with old healed discitis at L1-2 and significant improve in discitis at T12-L1 with resolution of paravertebral and intradiscal fluid and without evidence of acute discitis-osteomyelitis.   Molly [name] has been afebrile since admission. Blood cultures positive for MRSA and Serratia marcescens in 3/4  bottles. Initially treated with cefepime and linezolid. Antibiotics have been changed to Daptomycin and cefepime.  Critical Care and Hematology consulted and following.   Molly [name] has been feeling bad  for the past several days and continues to have throughout her body with the most severe pain in her back. Have decreased ability to walk over the past couple of days.      Review of Systems: Review of Systems  Constitutional: Negative for chills, fever and weight loss.  Respiratory: Positive for shortness of breath. Negative for cough and wheezing.   Cardiovascular: Positive for leg swelling. Negative for chest pain.  Gastrointestinal: Negative for abdominal pain, constipation, diarrhea, nausea and vomiting.  Musculoskeletal: Positive for back pain.  Skin: Negative for rash.  Neurological: Positive for weakness. Negative for dizziness.     Past Medical History:  Diagnosis Date  . 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 Abscess of skin    buttock - most recently 2009/10  . CKD (chronic kidney disease) stage 3, GFR 30-59 ml/min (HCC)   . Drug-seeking behavior   . Facial edema 03/21/2019  . Hypersplenism   . IV drug user   . Leg edema 03/21/2019  . MRSA (methicillin resistant Staphylococcus aureus) infection   . MRSA bacteremia   . Pancytopenia ([place])   . 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 Septic pulmonary embolism ([place])   . Tobacco abuse     Social History   Tobacco Use  . Smoking status: Current Every Day Smoker    Packs/day: 0.50    Years: 7.00    Pack years: 3.50    Types: Cigarettes  . Smokeless tobacco: Never Used  . Tobacco comment: slowing down  Substance Use Topics  . Alcohol use: No  . Drug use: Yes    Types: IV, Oxycodone    Comment: heroin    Family History  Problem Relation Age of Onset  . Alcoholism Father     Allergies  Allergen Reactions  . Lactose Intolerance ([place])   . Vancomycin Other (See Comments)    Possible contributor to AKI and thrombocytopenia    OBJECTIVE: Blood pressure 94/78, pulse 99, temperature 97.7 F (36.5 C), temperature source Oral, resp. rate (!) 25, height 5\' 1"  (1.549 m), weight 51.7 kg, SpO2 96 %.   Physical Exam Constitutional:      General: She is not in acute distress.    Appearance: She is well-developed. She is ill-appearing and toxic-appearing.     Interventions: Nasal cannula in place.  Cardiovascular:     Rate and Rhythm: Regular rhythm. Tachycardia present.     Heart sounds: Murmur present.     Comments: 1-2+ pitting edema of bilateral lower extremities.  Pulmonary:     Effort: Pulmonary effort is normal. Tachypnea present.     Breath sounds: Decreased breath sounds present. No wheezing or rhonchi.  Abdominal:     General: Bowel sounds are normal.     Palpations: There is no mass.     Tenderness: There is no guarding or rebound.  Skin:    General: Skin is warm and dry.     Comments: Multiple needle sticks on and around the right hand.   Neurological:     Mental Status: She is oriented to person, place, and time. She is lethargic.  Psychiatric:        Mood and Affect: Mood normal.     Lab Results Lab Results  Component Value Date   WBC  5.8 04/17/2019   HGB 9.3 (L) 04/17/2019   HCT 29.7 (L) 04/17/2019   MCV 94.0 04/17/2019   PLT 9 (LL) 04/17/2019   PLT 9 (LL) 04/17/2019    Lab Results  Component Value Date   CREATININE 2.49 (H) 04/17/2019   BUN 55 (H) 04/17/2019   NA 130 (L) 04/17/2019   K 3.9 04/17/2019   CL 101 04/17/2019   CO2 13 (L) 04/17/2019    Lab Results  Component Value Date   ALT 10 04/17/2019   AST 17 04/17/2019   ALKPHOS 61 04/17/2019   BILITOT 6.4 (H) 04/17/2019     Microbiology: Recent Results (from the past 240 hour(s))  Blood Culture (routine x 2)     Status: None (Preliminary result)   Collection Time: 04/01/2019  6:54 PM   Specimen: BLOOD RIGHT ARM  Result Value Ref Range Status   Specimen Description BLOOD RIGHT ARM  Final   Special Requests   Final    BOTTLES DRAWN AEROBIC AND ANAEROBIC Blood Culture results may not be optimal due to an inadequate volume of blood received in culture bottles   Culture  Setup Time   Final     GRAM POSITIVE COCCI IN CLUSTERS GRAM NEGATIVE RODS IN BOTH AEROBIC AND ANAEROBIC BOTTLES Organism ID to follow CRITICAL RESULT CALLED TO, READ BACK BY AND VERIFIED WITH: Dala Dock SINCLAIR [id number] 0830 MLM Performed at [facility], [place] [address], [place], [place] [address]    Culture GRAM POSITIVE COCCI GRAM NEGATIVE RODS   Final   Report Status PENDING  Incomplete  Blood Culture ID Panel (Reflexed)     Status: Abnormal   Collection Time: 04/15/2019  6:54 PM  Result Value Ref Range Status   Enterococcus species NOT DETECTED NOT DETECTED Final   Listeria monocytogenes NOT DETECTED NOT DETECTED Final   Staphylococcus species DETECTED (A) NOT DETECTED Final    Comment: PHARMD E SINCLAIR [id number] 0830 MLM   Staphylococcus aureus (BCID) DETECTED (A) NOT DETECTED Final    Comment: Methicillin (oxacillin)-resistant Staphylococcus aureus (MRSA). MRSA is predictably resistant to beta-lactam antibiotics (except ceftaroline). Preferred therapy is vancomycin unless clinically contraindicated. Patient requires contact precautions if  hospitalized. PHARMD E SINCLAIR [id number] 0830 MLM    Methicillin resistance DETECTED (A) NOT DETECTED Final    Comment: CRITICAL RESULT CALLED TO, READ BACK BY AND VERIFIED WITH: PHARMD E SINCLAIR [id number] 0830 MLM    Streptococcus species NOT DETECTED NOT DETECTED Final   Streptococcus agalactiae NOT DETECTED NOT DETECTED Final   Streptococcus pneumoniae NOT DETECTED NOT DETECTED Final   Streptococcus pyogenes NOT DETECTED NOT DETECTED Final   Acinetobacter baumannii NOT DETECTED NOT DETECTED Final   Enterobacteriaceae species DETECTED (A) NOT DETECTED Final    Comment: Enterobacteriaceae represent a large family of gram-negative bacteria, not a single organism. PHARMD E SINCLAIR [id number] 0830 MLM    Enterobacter cloacae complex NOT DETECTED NOT DETECTED Final   Escherichia coli NOT DETECTED NOT DETECTED Final   Klebsiella oxytoca NOT DETECTED NOT DETECTED Final    Klebsiella pneumoniae NOT DETECTED NOT DETECTED Final   Proteus species NOT DETECTED NOT DETECTED Final   Serratia marcescens DETECTED (A) NOT DETECTED Final    Comment: PHARMD E SINCLAIR [id number] 0830 MLM   Carbapenem resistance NOT DETECTED NOT DETECTED Final   Haemophilus influenzae NOT DETECTED NOT DETECTED Final   Neisseria meningitidis NOT DETECTED NOT DETECTED Final   Pseudomonas aeruginosa NOT DETECTED NOT DETECTED Final   Candida albicans NOT DETECTED NOT  DETECTED Final   Candida glabrata NOT DETECTED NOT DETECTED Final   Candida krusei NOT DETECTED NOT DETECTED Final   Candida parapsilosis NOT DETECTED NOT DETECTED Final   Candida tropicalis NOT DETECTED NOT DETECTED Final    Comment: Performed at [facility], [place] [address] [place], [place] [address]  SARS Coronavirus 2 (CEPHEID - Performed in [facility] lab), Hosp Order     Status: None   Collection Time: 03/19/2019  7:18 PM   Specimen: Nasopharyngeal Swab  Result Value Ref Range Status   SARS Coronavirus 2 NEGATIVE NEGATIVE Final    Comment: (NOTE) If result is NEGATIVE SARS-CoV-2 target nucleic acids are NOT DETECTED. The SARS-CoV-2 RNA is generally detectable in upper and lower  respiratory specimens during the acute phase of infection. The lowest  concentration of SARS-CoV-2 viral copies this assay can detect is 250  copies / mL. A negative result does not preclude SARS-CoV-2 infection  and should not be used as the sole basis for treatment or other  patient management decisions.  A negative result may occur with  improper specimen collection / handling, submission of specimen other  than nasopharyngeal swab, presence of viral mutation(s) within the  areas targeted by this assay, and inadequate number of viral copies  (<250 copies / mL). A negative result must be combined with clinical  observations, patient history, and epidemiological information. If result is POSITIVE SARS-CoV-2 target nucleic acids  are DETECTED. The SARS-CoV-2 RNA is generally detectable in upper and lower  respiratory specimens dur ing the acute phase of infection.  Positive  results are indicative of active infection with SARS-CoV-2.  Clinical  correlation with patient history and other diagnostic information is  necessary to determine patient infection status.  Positive results do  not rule out bacterial infection or co-infection with other viruses. If result is PRESUMPTIVE POSTIVE SARS-CoV-2 nucleic acids MAY BE PRESENT.   A presumptive positive result was obtained on the submitted specimen  and confirmed on repeat testing.  While 2019 novel coronavirus  (SARS-CoV-2) nucleic acids may be present in the submitted sample  additional confirmatory testing may be necessary for epidemiological  and / or clinical management purposes  to differentiate between  SARS-CoV-2 and other Sarbecovirus currently known to infect humans.  If clinically indicated additional testing with an alternate test  methodology [phone]) is advised. The SARS-CoV-2 RNA is generally  detectable in upper and lower respiratory sp ecimens during the acute  phase of infection. The expected result is Negative. Fact Sheet for Patients:  StrictlyIdeas.no Fact Sheet for Healthcare Providers: BankingDealers.co.za This test is not yet approved or cleared by the [place] FDA and has been authorized for detection and/or diagnosis of SARS-CoV-2 by FDA under an Emergency Use Authorization (EUA).  This EUA will remain in effect (meaning this test can be used) for the duration of the COVID-19 declaration under Section 564(b)(1) of the Act, 21 U.S.C. section 360bbb-3(b)(1), unless the authorization is terminated or revoked sooner. Performed at [facility], [place] [address] [place], [place] [address]   Blood Culture (routine x 2)     Status: None (Preliminary result)   Collection Time: 03/25/2019   8:53 PM   Specimen: BLOOD RIGHT FOREARM  Result Value Ref Range Status   Specimen Description BLOOD RIGHT FOREARM  Final   Special Requests   Final    BOTTLES DRAWN AEROBIC ONLY Blood Culture results may not be optimal due to an inadequate volume of blood received  in culture bottles   Culture  Setup Time   Final    GRAM POSITIVE COCCI GRAM NEGATIVE RODS AEROBIC BOTTLE ONLY Performed at [facility], [place] [address], [place], [place] [address]    Culture GRAM POSITIVE COCCI GRAM NEGATIVE RODS   Final   Report Status PENDING  Incomplete  MRSA PCR Screening     Status: Abnormal   Collection Time: 04/17/19  1:10 AM   Specimen: Nasal Mucosa; Nasopharyngeal  Result Value Ref Range Status   MRSA by PCR POSITIVE (A) NEGATIVE Final    Comment:        The GeneXpert MRSA Assay (FDA approved for NASAL specimens only), is one component of a comprehensive MRSA colonization surveillance program. It is not intended to diagnose MRSA infection nor to guide or monitor treatment for MRSA infections. RESULT CALLED TO, READ BACK BY AND VERIFIED WITH: BUENDIA,A RN [phone] 04/17/2019 MITCHELL,L Performed at [facility], [place] [address] [place],  [address]      Terri Piedra, [facility] [phone] Pager  04/17/2019  2:10 PM

## 2019-04-17 NOTE — Progress Notes (Signed)
PHARMACY - PHYSICIAN COMMUNICATION CRITICAL VALUE ALERT - BLOOD CULTURE IDENTIFICATION (BCID)  Molly [name] is an 33 y.o. female who presented to [facility] on 03/26/2019 with a chief complaint of leg swelling and shortness of breath.   Assessment:  33 year old female with a recent history of MSSA TV endocarditis in April of this year and  now admitted with MRSA and serratia bacteremia.   Name of physician (or Provider) Contacted: Sharlyne Pacas   Current antibiotics: Cefepime- Received 1 dose of linezolid   Changes to prescribed antibiotics recommended:  Start Daptomycin- Will discuss lung coverage with ID team  Leave Cefepime for now   Results for orders placed or performed during the hospital encounter of 03/26/2019  Blood Culture ID Panel (Reflexed) (Collected: 04/08/2019  6:54 PM)  Result Value Ref Range   Enterococcus species NOT DETECTED NOT DETECTED   Listeria monocytogenes NOT DETECTED NOT DETECTED   Staphylococcus species DETECTED (A) NOT DETECTED   Staphylococcus aureus (BCID) DETECTED (A) NOT DETECTED   Methicillin resistance DETECTED (A) NOT DETECTED   Streptococcus species NOT DETECTED NOT DETECTED   Streptococcus agalactiae NOT DETECTED NOT DETECTED   Streptococcus pneumoniae NOT DETECTED NOT DETECTED   Streptococcus pyogenes NOT DETECTED NOT DETECTED   Acinetobacter baumannii NOT DETECTED NOT DETECTED   Enterobacteriaceae species DETECTED (A) NOT DETECTED   Enterobacter cloacae complex NOT DETECTED NOT DETECTED   Escherichia coli NOT DETECTED NOT DETECTED   Klebsiella oxytoca NOT DETECTED NOT DETECTED   Klebsiella pneumoniae NOT DETECTED NOT DETECTED   Proteus species NOT DETECTED NOT DETECTED   Serratia marcescens DETECTED (A) NOT DETECTED   Carbapenem resistance NOT DETECTED NOT DETECTED   Haemophilus influenzae NOT DETECTED NOT DETECTED   Neisseria meningitidis NOT DETECTED NOT DETECTED   Pseudomonas aeruginosa NOT DETECTED NOT DETECTED   Candida albicans  NOT DETECTED NOT DETECTED   Candida glabrata NOT DETECTED NOT DETECTED   Candida krusei NOT DETECTED NOT DETECTED   Candida parapsilosis NOT DETECTED NOT DETECTED   Candida tropicalis NOT DETECTED NOT DETECTED    Jimmy Footman, PharmD, BCPS, BCIDP Infectious Diseases Clinical Pharmacist Phone: [phone] 04/17/2019  8:35 AM

## 2019-04-17 NOTE — Consult Note (Signed)
PULMONARY / CRITICAL CARE MEDICINE   NAME:  Molly [name], MRN:  [id number], DOB:  [birth year], LOS: 1 ADMISSION DATE:  03/31/2019, CONSULTATION DATE: 04/17/2019 REFERRING MD: [facility]ad, CHIEF COMPLAINT: Pain  BRIEF HISTORY:    History of polysubstance abuse with multiple interventions for MRSA bacteremia discitis. HISTORY OF PRESENT ILLNESS   33 year old with a long complicated history of polysubstance abuse leading to bacteremia, discitis, intubation and tracheostomy and long history of multiple antimicrobial therapy interventions.  She was recently discharged home in May 2020 and returns have not resumed IV drug abuse.  Blood cultures are positive for MRSA and Serratia.  She complains of back pain and pain all over.  She is awake alert in no acute distress at time of examination on 04/17/2019.  Her past medical history as well documented below.  We suspect that she will become worse and require return to the ICU in the future if she proves refractory to antimicrobial interventions.  She will most likely need a transesophageal echocardiogram again.  Infectious diseases following.  Pulmonary critical care will follow along. SIGNIFICANT PAST MEDICAL HISTORY   Polysubstance abuse Endocarditis Bacteremia Tracheostomy Failure to thrive Renal failure  SIGNIFICANT EVENTS:   STUDIES:   630 abdominal ultrasound>> CULTURES:  627 blood cultures x2+ for MRSA Serratia  ANTIBIOTICS:  629 Maxipime>> 629 Cubicin>> 629 Zyvox>> LINES/TUBES:    CONSULTANTS:  03/21/2019 pulmonary critical care SUBJECTIVE:  Frail 33 year old who is returned to IV drug abuse  CONSTITUTIONAL: BP 96/74   Pulse (!) 120   Temp 97.8 F (36.6 C) (Oral)   Resp (!) 25   Ht 5\' 1"  (1.549 m)   Wt 51.7 kg   SpO2 97%   BMI 21.54 kg/m   I/O last 3 completed shifts: In: 627.3 [Blood:204; IV Piggyback:423.3] Out: 150 [Urine:150]        PHYSICAL EXAM: General: Frail wasted teary female who is in no acute distress Neuro:  Complains of excruciating pain in the low back. HEENT: Tracheostomy scars well-healed Cardiovascular: Heart sounds are distant Lungs: Rhonchi bilaterally Abdomen: Soft positive bowel sounds Musculoskeletal: Intact Skin: Right hand needle marks noted.  Edema in all extremities.  Posterior examined no overt wounds or drainage noted.  Old areas of erosion from discitis are noted along the lumbar spine area.  RESOLVED PROBLEM LIST   ASSESSMENT AND PLAN   Bacteremia in the setting of IV drug abuse with a past medical history of bacteremia and endocarditis Blood cultures positive for staph aureus methicillin resistance Enterobacter Serratia marcescens Questionable sepsis Antimicrobial therapy per ID Currently hemodynamically stable tolerating on the floor but low threshold to transfer to more intensive care setting should she defervesced. Check procalcitonin for completeness Note she is on IV Lasix twice daily with adequate blood pressure which is not consistent with sepsis picture.  Thrombocytopenia questionable origin.  Platelets noted to be 8 with normal platelets 1 month ago of 202 Questionable secondary to bacteremia Monitor platelets Avoid anticoagulation Platelet infusions only if evidence of bleeding Consider hematology consult Noted to have no schistocytes  Chronic pain syndrome positive history of spinal infections osteomyelitis from IV drug abuse Careful with use of narcotics in somebody who is resistant to narcotics. Non-narcotic alternatives to pain relief  Renal insufficiency Lab Results  Component Value Date   CREATININE 2.49 (H) 04/17/2019   CREATININE 2.39 (H) 04/09/2019   CREATININE 2.20 (H) 03/24/2019   CREATININE 2.20 (H) 03/28/2019   CREATININE 0.97 11/02/2016   CREATININE 0.79 08/24/2016  Monitor creatinine Avoid nephrotoxins May  be secondary to malnourishment coupled with IV antibiotics Hydration as tolerated Avoid diuresis  History of vent dependent  respiratory failure requiring tracheostomy with decannulation in May 2020.  Questionable component of pneumonia versus IV drug abuse 04/17/2019 not currently respiratory distress O2 to keep sats greater than 92% Antibiotics for questionable pneumonia If she worsens she will need to go to the intensive care unit.    SUMMARY OF TODAY'S PLAN:  Evaluated by pulmonary critical care Noted to have a multiple bacterial species in blood Infectious diseases following Platelet count is 8 consider hematology consult She is gone back to doing IV drugs.   Best Practice / Goals of Care / Disposition.   DVT PROPHYLAXIS: No anticoagulation secondary to thrombocytopenia with platelets of 80 SUP: PPI as needed NUTRITION: Diet as tolerated MOBILITY: Currently on bedrest GOALS OF CARE: Currently full code FAMILY DISCUSSIONS: Patient spoke to at length at bedside no family at bedside DISPOSITION 04/17/2019 currently in 6 e 21.  LABS  Glucose Recent Labs  Lab 04/17/19 0753  GLUCAP 73    BMET Recent Labs  Lab 04/08/2019 2039 03/30/2019 2053 04/17/19 0721  NA 125*  125* 127* 130*  K 6.4*  6.4* 2.8* 3.9  CL 101  101 97* 101  CO2  --  16* 13*  BUN 71*  71* 53* 55*  CREATININE 2.20*  2.20* 2.39* 2.49*  GLUCOSE 73  73 79 76    Liver Enzymes Recent Labs  Lab 03/24/2019 2053 04/17/19 0721  AST 21 17  ALT 8 10  ALKPHOS 80 61  BILITOT 5.4* 6.4*  ALBUMIN 1.6* 1.6*    Electrolytes Recent Labs  Lab 04/04/2019 2053 04/17/19 0721  CALCIUM 7.3* 7.6*  MG  --  1.4*    CBC Recent Labs  Lab 03/28/2019 2039 04/01/2019 2053 04/17/19 0721  WBC  --  4.8 5.8  HGB 9.2*  9.2* 8.6* 9.3*  HCT 27.0*  27.0* 27.2* 29.7*  PLT  --  8* 9*  9*    ABG No results for input(s): PHART, PCO2ART, PO2ART in the last 168 hours.  Coag's Recent Labs  Lab 04/04/2019 2053 04/17/19 0721  APTT  --  51*  INR 1.8* 2.2*    Sepsis Markers Recent Labs  Lab 04/02/2019 2050 04/17/19 0742  LATICACIDVEN 2.5*  5.5*    Cardiac Enzymes No results for input(s): TROPONINI, PROBNP in the last 168 hours.  PAST MEDICAL HISTORY :   She  has a past medical history of Abscess in epidural space of T12/L1 spine, Abscess of skin, CKD (chronic kidney disease) stage 3, GFR 30-59 ml/min ([place]), Drug-seeking behavior, Facial edema (03/21/2019), Hypersplenism, IV drug user, Leg edema (03/21/2019), MRSA (methicillin resistant Staphylococcus aureus) infection, MRSA bacteremia, Pancytopenia ([place]), 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), Septic pulmonary embolism ([place]), and Tobacco abuse.  PAST SURGICAL HISTORY:  She  has a past surgical history that includes TEE without cardioversion (N/A, 07/04/2013); Tonsillectomy; ir generic historical (07/19/2016); Radiology with anesthesia (N/A, 04/11/2018); IR US Guide Vasc Access Right (02/05/2019); IR Fluoro Guide CV Line Right (02/05/2019); and IR Removal Tun Cv Cath W/O FL (03/06/2019).  Allergies  Allergen Reactions  . Lactose Intolerance (Gi)   . Vancomycin Other (See Comments)    Possible contributor to AKI and thrombocytopenia    No current facility-administered medications on file prior to encounter.    Current Outpatient Medications on File Prior to Encounter  Medication Sig  . buprenorphine-naloxone (SUBOXONE) 2-0.5 mg SUBL SL tablet Place 2 tablets under the tongue  2 (two) times a day.  . clonazePAM (KLONOPIN) 0.5 MG tablet Take 1 tablet (0.5 mg total) by mouth 2 (two) times daily as needed for anxiety.    FAMILY HISTORY:   Her family history includes Alcoholism in her father.  SOCIAL HISTORY:  She  reports that she has been smoking cigarettes. She has a 3.50 pack-year smoking history. She has never used smokeless tobacco. She reports current drug use. Drugs: IV and Oxycodone. She reports that she does not drink alcohol.  REVIEW OF SYSTEMS:    10 point review of system taken, please see HPI for positives and negatives.    Richardson Landry  ACNP [facility] Pager  [phone] till 1 pm If no answer page [phone] 04/17/2019, 10:48 AM

## 2019-04-18 ENCOUNTER — Encounter (HOSPITAL_COMMUNITY): Payer: Self-pay | Admitting: Internal Medicine

## 2019-04-18 ENCOUNTER — Inpatient Hospital Stay (HOSPITAL_COMMUNITY): Payer: Medicaid Other

## 2019-04-18 DIAGNOSIS — N179 Acute kidney failure, unspecified: Secondary | ICD-10-CM

## 2019-04-18 DIAGNOSIS — J189 Pneumonia, unspecified organism: Secondary | ICD-10-CM

## 2019-04-18 DIAGNOSIS — I079 Rheumatic tricuspid valve disease, unspecified: Secondary | ICD-10-CM | POA: Insufficient documentation

## 2019-04-18 DIAGNOSIS — N182 Chronic kidney disease, stage 2 (mild): Secondary | ICD-10-CM

## 2019-04-18 DIAGNOSIS — Z8619 Personal history of other infectious and parasitic diseases: Secondary | ICD-10-CM

## 2019-04-18 DIAGNOSIS — I38 Endocarditis, valve unspecified: Secondary | ICD-10-CM

## 2019-04-18 DIAGNOSIS — A4102 Sepsis due to Methicillin resistant Staphylococcus aureus: Secondary | ICD-10-CM | POA: Diagnosis present

## 2019-04-18 DIAGNOSIS — R0603 Acute respiratory distress: Secondary | ICD-10-CM

## 2019-04-18 DIAGNOSIS — R918 Other nonspecific abnormal finding of lung field: Secondary | ICD-10-CM

## 2019-04-18 DIAGNOSIS — I361 Nonrheumatic tricuspid (valve) insufficiency: Secondary | ICD-10-CM

## 2019-04-18 DIAGNOSIS — K759 Inflammatory liver disease, unspecified: Secondary | ICD-10-CM

## 2019-04-18 DIAGNOSIS — A4153 Sepsis due to Serratia: Secondary | ICD-10-CM | POA: Diagnosis present

## 2019-04-18 DIAGNOSIS — D65 Disseminated intravascular coagulation [defibrination syndrome]: Secondary | ICD-10-CM | POA: Diagnosis present

## 2019-04-18 DIAGNOSIS — D696 Thrombocytopenia, unspecified: Secondary | ICD-10-CM

## 2019-04-18 LAB — COMPREHENSIVE METABOLIC PANEL
ALT: 10 U/L (ref 0–44)
AST: 14 U/L — ABNORMAL LOW (ref 15–41)
Albumin: 1.4 g/dL — ABNORMAL LOW (ref 3.5–5.0)
Alkaline Phosphatase: 58 U/L (ref 38–126)
Anion gap: 15 (ref 5–15)
BUN: 66 mg/dL — ABNORMAL HIGH (ref 6–20)
CO2: 14 mmol/L — ABNORMAL LOW (ref 22–32)
Calcium: 7.5 mg/dL — ABNORMAL LOW (ref 8.9–10.3)
Chloride: 104 mmol/L (ref 98–111)
Creatinine, Ser: 2.61 mg/dL — ABNORMAL HIGH (ref 0.44–1.00)
GFR calc Af Amer: 27 mL/min — ABNORMAL LOW (ref >60)
GFR calc non Af Amer: 24 mL/min — ABNORMAL LOW (ref >60)
Glucose, Bld: 66 mg/dL — ABNORMAL LOW (ref 70–99)
Potassium: 3.9 mmol/L (ref 3.5–5.1)
Sodium: 133 mmol/L — ABNORMAL LOW (ref 135–145)
Total Bilirubin: 8 mg/dL — ABNORMAL HIGH (ref 0.3–1.2)
Total Protein: 5 g/dL — ABNORMAL LOW (ref 6.5–8.1)

## 2019-04-18 LAB — CBC WITH DIFFERENTIAL/PLATELET
Abs Immature Granulocytes: 0.41 10*3/uL — ABNORMAL HIGH (ref 0.00–0.07)
Basophils Absolute: 0 10*3/uL (ref 0.0–0.1)
Basophils Relative: 0 %
Eosinophils Absolute: 0 10*3/uL (ref 0.0–0.5)
Eosinophils Relative: 0 %
HCT: 28.1 % — ABNORMAL LOW (ref 36.0–46.0)
Hemoglobin: 9 g/dL — ABNORMAL LOW (ref 12.0–15.0)
Immature Granulocytes: 7 %
Lymphocytes Relative: 9 %
Lymphs Abs: 0.5 10*3/uL — ABNORMAL LOW (ref 0.7–4.0)
MCH: 29.2 pg (ref 26.0–34.0)
MCHC: 32 g/dL (ref 30.0–36.0)
MCV: 91.2 fL (ref 80.0–100.0)
Monocytes Absolute: 0.1 10*3/uL (ref 0.1–1.0)
Monocytes Relative: 2 %
Neutro Abs: 5 10*3/uL (ref 1.7–7.7)
Neutrophils Relative %: 82 %
Platelets: 10 10*3/uL — CL (ref 150–400)
RBC: 3.08 MIL/uL — ABNORMAL LOW (ref 3.87–5.11)
RDW: 17.2 % — ABNORMAL HIGH (ref 11.5–15.5)
WBC: 6.1 10*3/uL (ref 4.0–10.5)
nRBC: 0.3 % — ABNORMAL HIGH (ref 0.0–0.2)

## 2019-04-18 LAB — PREPARE PLATELET PHERESIS
Unit division: 0
Unit division: 0

## 2019-04-18 LAB — RHOGAM INJECTION: Unit division: 0

## 2019-04-18 LAB — DIC (DISSEMINATED INTRAVASCULAR COAGULATION)PANEL
D-Dimer, Quant: 11.8 ug/mL-FEU — ABNORMAL HIGH (ref 0.00–0.50)
Fibrinogen: 114 mg/dL — ABNORMAL LOW (ref 210–475)
INR: 2.2 — ABNORMAL HIGH (ref 0.8–1.2)
Platelets: 11 10*3/uL — CL (ref 150–400)
Prothrombin Time: 24.1 seconds — ABNORMAL HIGH (ref 11.4–15.2)
Smear Review: NONE SEEN
aPTT: 44 seconds — ABNORMAL HIGH (ref 24–36)

## 2019-04-18 LAB — HEPATITIS PANEL, ACUTE
HCV Ab: >11 s/co ratio — ABNORMAL HIGH (ref 0.0–0.9)
Hep A IgM: NEGATIVE
Hep B C IgM: NEGATIVE
Hepatitis B Surface Ag: NEGATIVE

## 2019-04-18 LAB — BPAM PLATELET PHERESIS
Blood Product Expiration Date: 202007012359
Blood Product Expiration Date: 202007032359
ISSUE DATE / TIME: 202006300142
ISSUE DATE / TIME: 202006301413
Unit Type and Rh: 5100
Unit Type and Rh: 5100

## 2019-04-18 LAB — GLUCOSE, CAPILLARY: Glucose-Capillary: 71 mg/dL (ref 70–99)

## 2019-04-18 LAB — ECHOCARDIOGRAM COMPLETE
Height: 61 in
Weight: 1869.5 oz

## 2019-04-18 LAB — PROTIME-INR
INR: 2.1 — ABNORMAL HIGH (ref 0.8–1.2)
Prothrombin Time: 23.4 seconds — ABNORMAL HIGH (ref 11.4–15.2)

## 2019-04-18 LAB — CK: Total CK: 21 U/L — ABNORMAL LOW (ref 38–234)

## 2019-04-18 LAB — RETICULOCYTES
Immature Retic Fract: 6.5 % (ref 2.3–15.9)
RBC.: 3.08 MIL/uL — ABNORMAL LOW (ref 3.87–5.11)
Retic Count, Absolute: 14.8 10*3/uL — ABNORMAL LOW (ref 19.0–186.0)
Retic Ct Pct: 0.5 % (ref 0.4–3.1)

## 2019-04-18 LAB — LACTIC ACID, PLASMA: Lactic Acid, Venous: 3.4 mmol/L (ref 0.5–1.9)

## 2019-04-18 LAB — PROCALCITONIN: Procalcitonin: 47.37 ng/mL

## 2019-04-18 MED ORDER — SODIUM BICARBONATE 8.4 % IV SOLN
INTRAVENOUS | Status: DC
  Administered 2019-04-18: 09:00:00 via INTRAVENOUS
  Filled 2019-04-18: qty 150

## 2019-04-18 MED ORDER — FUROSEMIDE 10 MG/ML IJ SOLN
40.0000 mg | Freq: Two times a day (BID) | INTRAMUSCULAR | Status: DC
  Administered 2019-04-18 – 2019-04-20 (×5): 40 mg via INTRAVENOUS
  Filled 2019-04-18 (×5): qty 4

## 2019-04-18 MED ORDER — SODIUM BICARBONATE 650 MG PO TABS: 650.0000 mg | ORAL_TABLET | Freq: Three times a day (TID) | ORAL | Status: DC

## 2019-04-18 MED ORDER — SODIUM BICARBONATE 650 MG PO TABS
650.0000 mg | ORAL_TABLET | Freq: Three times a day (TID) | ORAL | Status: DC
  Administered 2019-04-18 – 2019-04-20 (×7): 650 mg via ORAL
  Filled 2019-04-18 (×7): qty 1

## 2019-04-18 NOTE — Progress Notes (Signed)
[facility] for Infectious Disease  Date of Admission:  04/02/2019   Total days of antibiotics 3        Day 1: cefepime, linezolid        Day 2-3: cefepime, dapto, doxy       Principal Problem:   Sepsis due to methicillin resistant Staphylococcus aureus (MRSA) with disseminated intravascular coagulation ([place]) Active Problems:   Acute renal failure superimposed on stage 2 chronic kidney disease ([place])   Polysubstance abuse ([place])   . sodium chloride   Intravenous Once  . Chlorhexidine Gluconate Cloth  6 each Topical Q0600  . doxycycline  100 mg Oral Q12H  . furosemide  40 mg Intravenous Q12H  . mupirocin ointment  1 application Nasal BID  . sodium bicarbonate  650 mg Oral TID  . sodium chloride flush  3 mL Intravenous Q12H  . sodium chloride flush  3 mL Intravenous Q12H   ASSESSMENT: Mrs.Drozdowski is a 33 yo F w/ PMH of IV drug use, MRSA tricuspid valve endocarditis, epidural abscess, CKD and seizures admit for Sepsis 2/2 MRSA, Serratia marcescens on hospital day 2  Severe sepsis 2/2 MRSA/Serratia marcescens: Critically ill with multi organ failure including AKI, Hepatitis, and DIC. Currently awaiting echocardiogram to evaluate for endocarditis. Blood culture growing staph aureus and gram negative rods. On cefepime and daptomycin day 2. Currently blood pressure is stable but high risk for decompensation and pressure support. Likely will need transfer to ICU if no improvement - C/w cefepime, daptomycin - F/u repeat blood culture  - Awaiting sensitivities for serratia - could transition to ceftaroline for more efficient dosing  DIC 2/2 sepsis: Due to MRSA bacteremia. Heme/onc following. S/p Vitamin K x3, 1 unit platelets. PT 24.1, INR 2.2, aPTT 44. Platelets 11 this am - Appreciate Heme/onc recs  Multifocal Pneumonia: Tachypneic, appear in respiratory distress. 94% on 2L. X-ray with findings of multifocal pneumonia.  - C/w doxycycline 100mg  BID - F/u cultures  Hx of  endocarditis: Prior echocardiogram in 01/2019 showing tricuspid vegetation with regurgitation. Concerning for re-occurrence due to continued IV opioid use.  - F/u echocardiogram - Likely will need TEE when stable  SUBJECTIVE:  Ms.Selbe was examined and evaluated at bedside this AM. She was noted to be disoriented and yelling out in pain with blood draw but unable to answer questions. She was evaluated with nursing at bedside who mentions she has been complaining of back pain.   Review of Systems: Review of Systems  Reason unable to perform ROS: Disoriented and unable to answer orientation questions.    Past Medical History:  Diagnosis Date  . 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 Abscess of skin    buttock - most recently 2009/10  . CKD (chronic kidney disease) stage 3, GFR 30-59 ml/min ([facility])   . Drug-seeking behavior   . Facial edema 03/21/2019  . Hypersplenism   . IV drug user   . Leg edema 03/21/2019  . MRSA (methicillin resistant Staphylococcus aureus) infection   . MRSA bacteremia   . Pancytopenia ([place])   . 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 Sepsis with disseminated intravascular coagulopathy (DIC) ([place]) 01/20/2019  . Sepsis with multi-organ dysfunction ([place]) 04/03/2019  . Sepsis with multiple organ dysfunction (MOD) ([place]) 04/08/2018  . Septic pulmonary embolism ([place])   . Tobacco abuse     Social History   Tobacco Use  . Smoking status: Current Every Day Smoker  Packs/day: 0.50    Years: 7.00    Pack years: 3.50    Types: Cigarettes  . Smokeless tobacco: Never Used  . Tobacco comment: slowing down  Substance Use Topics  . Alcohol use: No  . Drug use: Yes    Types: IV, Oxycodone    Comment: heroin    Family History  Problem Relation Age of Onset  . Alcoholism Father    Allergies  Allergen Reactions  . Lactose Intolerance (Gi)   . Vancomycin Other (See Comments)    Possible contributor to AKI and thrombocytopenia   OBJECTIVE: Vitals:    04/18/19 0050 04/18/19 0333 04/18/19 0415 04/18/19 0753  BP: 106/74 109/76 107/85 (!) 103/92  Pulse: (!) 101  (!) 102 (!) 101  Resp: (!) 32  (!) 26   Temp: 97.9 F (36.6 C)  97.6 F (36.4 C) 97.6 F (36.4 C)  TempSrc: Axillary  Axillary Oral  SpO2: 96% 93% 94% 100%  Weight:   53 kg   Height:       Body mass index is 22.08 kg/m.  Physical Exam Constitutional:      General: She is in acute distress.     Appearance: She is toxic-appearing.  Eyes:     General: Scleral icterus present.  Cardiovascular:     Rate and Rhythm: Regular rhythm. Tachycardia present.  Pulmonary:     Effort: Respiratory distress (accessory muscle use) present.     Comments: Tachypneic Musculoskeletal: Normal range of motion.     Right lower leg: Edema present.     Left lower leg: Edema present.  Skin:    General: Skin is warm and dry.     Coloration: Skin is jaundiced.     Findings: Bruising (Multiple areas of ecchymosis around injection sites) present.  Neurological:     Mental Status: She is disoriented.    Lab Results Lab Results  Component Value Date   WBC 6.1 04/18/2019   HGB 9.0 (L) 04/18/2019   HCT 28.1 (L) 04/18/2019   MCV 91.2 04/18/2019   PLT 10 (LL) 04/18/2019   PLT 11 (LL) 04/18/2019    Lab Results  Component Value Date   CREATININE 2.61 (H) 04/18/2019   BUN 66 (H) 04/18/2019   NA 133 (L) 04/18/2019   K 3.9 04/18/2019   CL 104 04/18/2019   CO2 14 (L) 04/18/2019    Lab Results  Component Value Date   ALT 10 04/18/2019   AST 14 (L) 04/18/2019   ALKPHOS 58 04/18/2019   BILITOT 8.0 (H) 04/18/2019     Microbiology: Recent Results (from the past 240 hour(s))  Blood Culture (routine x 2)     Status: Abnormal (Preliminary result)   Collection Time: 04/15/2019  6:54 PM   Specimen: BLOOD RIGHT ARM  Result Value Ref Range Status   Specimen Description BLOOD RIGHT ARM  Final   Special Requests   Final    BOTTLES DRAWN AEROBIC AND ANAEROBIC Blood Culture results may not be  optimal due to an inadequate volume of blood received in culture bottles   Culture  Setup Time   Final    GRAM POSITIVE COCCI IN CLUSTERS GRAM NEGATIVE RODS IN BOTH AEROBIC AND ANAEROBIC BOTTLES CRITICAL RESULT CALLED TO, READ BACK BY AND VERIFIED WITH: PHARMD E SINCLAIR [id number] 0830 MLM    Culture (A)  Final    STAPHYLOCOCCUS AUREUS SERRATIA MARCESCENS SUSCEPTIBILITIES TO FOLLOW Performed at [facility], [place] [address], [place], [place] [address]  Report Status PENDING  Incomplete  Blood Culture ID Panel (Reflexed)     Status: Abnormal   Collection Time: 03/29/2019  6:54 PM  Result Value Ref Range Status   Enterococcus species NOT DETECTED NOT DETECTED Final   Listeria monocytogenes NOT DETECTED NOT DETECTED Final   Staphylococcus species DETECTED (A) NOT DETECTED Final    Comment: PHARMD E SINCLAIR [id number] 0830 MLM   Staphylococcus aureus (BCID) DETECTED (A) NOT DETECTED Final    Comment: Methicillin (oxacillin)-resistant Staphylococcus aureus (MRSA). MRSA is predictably resistant to beta-lactam antibiotics (except ceftaroline). Preferred therapy is vancomycin unless clinically contraindicated. Patient requires contact precautions if  hospitalized. PHARMD E SINCLAIR [id number] 0830 MLM    Methicillin resistance DETECTED (A) NOT DETECTED Final    Comment: CRITICAL RESULT CALLED TO, READ BACK BY AND VERIFIED WITH: PHARMD E SINCLAIR [id number] 0830 MLM    Streptococcus species NOT DETECTED NOT DETECTED Final   Streptococcus agalactiae NOT DETECTED NOT DETECTED Final   Streptococcus pneumoniae NOT DETECTED NOT DETECTED Final   Streptococcus pyogenes NOT DETECTED NOT DETECTED Final   Acinetobacter baumannii NOT DETECTED NOT DETECTED Final   Enterobacteriaceae species DETECTED (A) NOT DETECTED Final    Comment: Enterobacteriaceae represent a large family of gram-negative bacteria, not a single organism. PHARMD E SINCLAIR [id number] 0830 MLM    Enterobacter cloacae complex NOT DETECTED NOT  DETECTED Final   Escherichia coli NOT DETECTED NOT DETECTED Final   Klebsiella oxytoca NOT DETECTED NOT DETECTED Final   Klebsiella pneumoniae NOT DETECTED NOT DETECTED Final   Proteus species NOT DETECTED NOT DETECTED Final   Serratia marcescens DETECTED (A) NOT DETECTED Final    Comment: PHARMD E SINCLAIR [id number] 0830 MLM   Carbapenem resistance NOT DETECTED NOT DETECTED Final   Haemophilus influenzae NOT DETECTED NOT DETECTED Final   Neisseria meningitidis NOT DETECTED NOT DETECTED Final   Pseudomonas aeruginosa NOT DETECTED NOT DETECTED Final   Candida albicans NOT DETECTED NOT DETECTED Final   Candida glabrata NOT DETECTED NOT DETECTED Final   Candida krusei NOT DETECTED NOT DETECTED Final   Candida parapsilosis NOT DETECTED NOT DETECTED Final   Candida tropicalis NOT DETECTED NOT DETECTED Final    Comment: Performed at [facility], [place] [address] [place], [place] [address]  SARS Coronavirus 2 (CEPHEID - Performed in [facility] hospital lab), Hosp Order     Status: None   Collection Time: 04/13/2019  7:18 PM   Specimen: Nasopharyngeal Swab  Result Value Ref Range Status   SARS Coronavirus 2 NEGATIVE NEGATIVE Final    Comment: (NOTE) If result is NEGATIVE SARS-CoV-2 target nucleic acids are NOT DETECTED. The SARS-CoV-2 RNA is generally detectable in upper and lower  respiratory specimens during the acute phase of infection. The lowest  concentration of SARS-CoV-2 viral copies this assay can detect is 250  copies / mL. A negative result does not preclude SARS-CoV-2 infection  and should not be used as the sole basis for treatment or other  patient management decisions.  A negative result may occur with  improper specimen collection / handling, submission of specimen other  than nasopharyngeal swab, presence of viral mutation(s) within the  areas targeted by this assay, and inadequate number of viral copies  (<250 copies / mL). A negative result must be combined with  clinical  observations, patient history, and epidemiological information. If result is POSITIVE SARS-CoV-2 target nucleic acids are DETECTED. The SARS-CoV-2 RNA is generally detectable in upper and lower  respiratory specimens dur ing the  acute phase of infection.  Positive  results are indicative of active infection with SARS-CoV-2.  Clinical  correlation with patient history and other diagnostic information is  necessary to determine patient infection status.  Positive results do  not rule out bacterial infection or co-infection with other viruses. If result is PRESUMPTIVE POSTIVE SARS-CoV-2 nucleic acids MAY BE PRESENT.   A presumptive positive result was obtained on the submitted specimen  and confirmed on repeat testing.  While 2019 novel coronavirus  (SARS-CoV-2) nucleic acids may be present in the submitted sample  additional confirmatory testing may be necessary for epidemiological  and / or clinical management purposes  to differentiate between  SARS-CoV-2 and other Sarbecovirus currently known to infect humans.  If clinically indicated additional testing with an alternate test  methodology [phone]) is advised. The SARS-CoV-2 RNA is generally  detectable in upper and lower respiratory sp ecimens during the acute  phase of infection. The expected result is Negative. Fact Sheet for Patients:  StrictlyIdeas.no Fact Sheet for Healthcare Providers: BankingDealers.co.za This test is not yet approved or cleared by the [place] FDA and has been authorized for detection and/or diagnosis of SARS-CoV-2 by FDA under an Emergency Use Authorization (EUA).  This EUA will remain in effect (meaning this test can be used) for the duration of the COVID-19 declaration under Section 564(b)(1) of the Act, 21 U.S.C. section 360bbb-3(b)(1), unless the authorization is terminated or revoked sooner. Performed at [facility], [place]  [address], [place], [place] [address]   Blood Culture (routine x 2)     Status: Abnormal (Preliminary result)   Collection Time: 04/17/2019  8:53 PM   Specimen: BLOOD RIGHT FOREARM  Result Value Ref Range Status   Specimen Description BLOOD RIGHT FOREARM  Final   Special Requests   Final    BOTTLES DRAWN AEROBIC ONLY Blood Culture results may not be optimal due to an inadequate volume of blood received in culture bottles   Culture  Setup Time   Final    GRAM POSITIVE COCCI GRAM NEGATIVE RODS AEROBIC BOTTLE ONLY Performed at [facility], [place] [address] [place], [place] [address]    Culture STAPHYLOCOCCUS AUREUS GRAM NEGATIVE RODS  (A)  Final   Report Status PENDING  Incomplete  MRSA PCR Screening     Status: Abnormal   Collection Time: 04/17/19  1:10 AM   Specimen: Nasal Mucosa; Nasopharyngeal  Result Value Ref Range Status   MRSA by PCR POSITIVE (A) NEGATIVE Final    Comment:        The GeneXpert MRSA Assay (FDA approved for NASAL specimens only), is one component of a comprehensive MRSA colonization surveillance program. It is not intended to diagnose MRSA infection nor to guide or monitor treatment for MRSA infections. RESULT CALLED TO, READ BACK BY AND VERIFIED WITH: BUENDIA,A RN [phone] 04/17/2019 MITCHELL,L Performed at [facility], [place] [address] [place], [place] [address]     Mosetta Anis, [facility] [place] [phone] pager   [phone] cell 04/18/2019, 11:47 AM

## 2019-04-18 NOTE — Progress Notes (Addendum)
PROGRESS NOTE    Molly [name]  YPP:[id number] DOB: 1986/04/26 DOA: 04/15/2019 PCP: Patient, No Pcp Per   Brief Narrative: Patient is a 33 year old female with history of IV drug abuse, MRSA tricuspid valve endocarditis, history of epidural abscess, chronic renal insufficiency, seizures, noncompliance who presents to the emergency department for the evaluation of progressive leg swelling, shortness of breath and generalized body aches and pains.  On presentation, she was found to be afebrile, tachycardic, tachypneic and hypotensive.  Chest x-ray was concerning for multifocal airspace opacities bilaterally suggesting multifocal pneumonia as well as cardiomegaly and volume overload.  She was found to have hyponatremia, hypokalemia, acute kidney injury.  New thrombocytopenia with platelets of 8000, anemia, elevated lactic acid and elevated INR. Blood cultures have been sent which has shown MRSA and Serratia.  She has been started on broad-spectrum antibiotics.  ID already consulted. She has picture of DIC most likely associated with sepsis.  ID,PCCM and hematology following.  7/1: Started on lasix for severe peripheral edema  Assessment & Plan:   Principal Problem:   Sepsis with multiple organ dysfunction (MOD) (HCC) Active Problems:   Acute renal failure superimposed on stage 2 chronic kidney disease (HCC)   Anasarca   Polysubstance abuse ([place])   Hyponatremia   Chronic kidney disease   Thrombocytopenia (HCC)   Sepsis with disseminated intravascular coagulopathy (DIC) ([place])   Sepsis with multi-organ dysfunction (HCC)   Seizure ([place])   DIC: Has severe thrombocytopenia (new), elevated Ddimer,elevated INR, low fibrinogen, elevated APTT.  Given vitamin K.  S/P transfusion with 1 unit of platelets.  Hematology following.  Her DIC is associated with sepsis.  Monitor d-dimer, fibrillation, INR  Thrombocytopenia: Continue platelet transfusion if platelets less than 10,000.Platelets 10000  today.  Bacteremia: Blood cultures growing MRSA and Serratia.  Continue current broad-spectrum antibiotics.  Follow-up final blood culture report.  ID following.  Currently on vancomycin, cefepime and doxycycline. We will get echocardiogram.  Severe sepsis: Presented with hypotension, elevated lactate level, leukocytosis.  Continue  IV antibiotics.  Follow-up cultures  Multifocal pneumonia:  Continue current antibiotics.  Monitor respiratory status closely.  COVID-19 screening is negative.  Continue supplemental oxygen as needed.  Recent history of epidural abscess/discitis/back pain: Recent MRI negative for acute findings.  AKI on CKD stage II:Has severe bilateral lower extremity edema.  Bicarb of 14..  Started on Lasix and sodium bicarb tablets.  Will get ultrasound of the kidneys.  Foley placed.  Hyperbilirubinemia: Most likely secondary to hemolysis.  Normal transaminases and alkaline phosphatase.  Hypokalemia/hypomagnesemia: Supplemented.  Anasarca: Multifactorial.  Presented with bilateral lower extremity edema.  Bibasilar with hypoalbuminemia.  She is on Lasix at home.bilateral lower extremity Doppler negative for DVT.  IV drug abuse: Injects fentanyl/heroin.  Last intake a day before admission.  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  Pressure ulcers: Stage I-II sacral ulcer.  Wound care Following           DVT prophylaxis:SCD Code Status: Full Family Communication: Discussed with mother Disposition Plan: Undetermined at this point.  Patient is critically ill.  Low threshold to move to intensive care unit.   Consultants: PCCM, hematology, ID  Procedures: None  Antimicrobials:  Anti-infectives (From admission, onward)   Start     Dose/Rate Route Frequency Ordered Stop   04/17/19 1515  doxycycline (VIBRA-TABS) tablet 100 mg     100 mg Oral Every 12  hours 04/17/19 1506     04/17/19 1200  DAPTOmycin (CUBICIN) 500 mg in sodium chloride 0.9 % IVPB     500 mg 220 mL/hr over 30 Minutes Intravenous Every 48 hours 04/17/19 0847     03/23/2019 2115  ceFEPIme (MAXIPIME) 1 g in sodium chloride 0.9 % 100 mL IVPB     1 g 200 mL/hr over 30 Minutes Intravenous Every 24 hours 03/30/2019 2100     04/10/2019 2115  linezolid (ZYVOX) IVPB 600 mg     600 mg 300 mL/hr over 60 Minutes Intravenous Once 03/28/2019 2100 04/09/2019 2344      Subjective: Patient seen and examined at bedside this morning.  Awake, lying on the bed.  Icteric.  Complains of severe back pain.  Objective: Vitals:   04/18/19 0050 04/18/19 0333 04/18/19 0415 04/18/19 0753  BP: 106/74 109/76 107/85 (!) 103/92  Pulse: (!) 101  (!) 102 (!) 101  Resp: (!) 32  (!) 26   Temp: 97.9 F (36.6 C)  97.6 F (36.4 C) 97.6 F (36.4 C)  TempSrc: Axillary  Axillary Oral  SpO2: 96% 93% 94% 100%  Weight:   53 kg   Height:        Intake/Output Summary (Last 24 hours) at 04/18/2019 1050 Last data filed at 04/17/2019 1554 Gross per 24 hour  Intake 842.28 ml  Output --  Net 842.28 ml   Filed Weights   04/03/2019 1842 04/17/19 0032 04/18/19 0415  Weight: 49 kg 51.7 kg 53 kg    Examination:  General exam: Weak, chronically ill looking, icteric  HEENT:PERRL,Oral mucosa moist Respiratory system: Bilateral equal air entry, normal vesicular breath sounds, no wheezes or crackles  Cardiovascular system: S1 & S2 heard, RRR. Gastrointestinal system: Abdomen is mildly distended, soft and nontender. No organomegaly or masses felt. Normal bowel sounds heard. Central nervous system: Alert and oriented. Extremities: Severe bilateral lower extremity edema Skin: Icterus, pressure ulcers on the sacral area    Data Reviewed: I have personally reviewed following labs and imaging studies  CBC: Recent Labs  Lab 03/30/2019 2039 03/26/2019 2053 04/17/19 0721 04/17/19 1422 04/18/19 0201  WBC  --  4.8 5.8 6.4  6.1  NEUTROABS  --  3.9 5.0 5.5 5.0  HGB 9.2*   9.2* 8.6* 9.3* 8.8* 9.0*  HCT 27.0*   27.0* 27.2* 29.7* 26.9* 28.1*  MCV  --  93.5 94.0 92.4 91.2  PLT  --  8* 9*   9* 7* 11*   10*   Basic Metabolic Panel: Recent Labs  Lab 03/19/2019 2039 04/11/2019 2053 04/17/19 0721 04/17/19 1422 04/17/19 2119 04/18/19 0201  NA 125*   125* 127* 130* 130* 131* 133*  K 6.4*   6.4* 2.8* 3.9  --   --  3.9  CL 101   101 97* 101  --   --  104  CO2  --  16* 13*  --   --  14*  GLUCOSE 73   73 79 76  --   --  66*  BUN 71*   71* 53* 55*  --   --  66*  CREATININE 2.20*   2.20* 2.39* 2.49*  --   --  2.61*  CALCIUM  --  7.3* 7.6*  --   --  7.5*  MG  --   --  1.4*  --   --   --    GFR: Estimated Creatinine Clearance: 23.6 mL/min (A) (by C-G formula based on SCr of 2.61 mg/dL (H)).  Liver Function Tests: Recent Labs  Lab 04/13/2019 2053 04/17/19 0721 04/18/19 0201  AST 21 17 14*  ALT 8 10 10   ALKPHOS 80 61 58  BILITOT 5.4* 6.4* 8.0*  PROT 5.2* 5.2* 5.0*  ALBUMIN 1.6* 1.6* 1.4*   No results for input(s): LIPASE, AMYLASE in the last 168 hours. No results for input(s): AMMONIA in the last 168 hours. Coagulation Profile: Recent Labs  Lab 04/12/2019 2053 04/17/19 0721 04/18/19 0201  INR 1.8* 2.2* 2.2*   2.1*   Cardiac Enzymes: Recent Labs  Lab 04/18/19 0201  CKTOTAL 21*   BNP (last 3 results) No results for input(s): PROBNP in the last 8760 hours. HbA1C: No results for input(s): HGBA1C in the last 72 hours. CBG: Recent Labs  Lab 04/17/19 0753 04/18/19 0751  GLUCAP 73 71   Lipid Profile: No results for input(s): CHOL, HDL, LDLCALC, TRIG, CHOLHDL, LDLDIRECT in the last 72 hours. Thyroid Function Tests: No results for input(s): TSH, T4TOTAL, FREET4, T3FREE, THYROIDAB in the last 72 hours. Anemia Panel: Recent Labs    04/18/19 0201  RETICCTPCT 0.5   Sepsis Labs: Recent Labs  Lab 04/15/2019 2050 04/17/19 0721 04/17/19 0742 04/17/19 1440 04/18/19 0201  PROCALCITON  --  46.86  --   --   47.37  LATICACIDVEN 2.5*  --  5.5* 3.9* 3.4*    Recent Results (from the past 240 hour(s))  Blood Culture (routine x 2)     Status: Abnormal (Preliminary result)   Collection Time: 03/21/2019  6:54 PM   Specimen: BLOOD RIGHT ARM  Result Value Ref Range Status   Specimen Description BLOOD RIGHT ARM  Final   Special Requests   Final    BOTTLES DRAWN AEROBIC AND ANAEROBIC Blood Culture results may not be optimal due to an inadequate volume of blood received in culture bottles   Culture  Setup Time   Final    GRAM POSITIVE COCCI IN CLUSTERS GRAM NEGATIVE RODS IN BOTH AEROBIC AND ANAEROBIC BOTTLES CRITICAL RESULT CALLED TO, READ BACK BY AND VERIFIED WITH: PHARMD E SINCLAIR [id number] 0830 MLM    Culture (A)  Final    STAPHYLOCOCCUS AUREUS SERRATIA MARCESCENS SUSCEPTIBILITIES TO FOLLOW Performed at [facility], [place] [address], [place], [place] [address]    Report Status PENDING  Incomplete  Blood Culture ID Panel (Reflexed)     Status: Abnormal   Collection Time: 03/19/2019  6:54 PM  Result Value Ref Range Status   Enterococcus species NOT DETECTED NOT DETECTED Final   Listeria monocytogenes NOT DETECTED NOT DETECTED Final   Staphylococcus species DETECTED (A) NOT DETECTED Final    Comment: PHARMD E SINCLAIR [id number] 0830 MLM   Staphylococcus aureus (BCID) DETECTED (A) NOT DETECTED Final    Comment: Methicillin (oxacillin)-resistant Staphylococcus aureus (MRSA). MRSA is predictably resistant to beta-lactam antibiotics (except ceftaroline). Preferred therapy is vancomycin unless clinically contraindicated. Patient requires contact precautions if  hospitalized. PHARMD E SINCLAIR [id number] 0830 MLM    Methicillin resistance DETECTED (A) NOT DETECTED Final    Comment: CRITICAL RESULT CALLED TO, READ BACK BY AND VERIFIED WITH: PHARMD E SINCLAIR [id number] 0830 MLM    Streptococcus species NOT DETECTED NOT DETECTED Final   Streptococcus agalactiae NOT DETECTED NOT DETECTED Final   Streptococcus  pneumoniae NOT DETECTED NOT DETECTED Final   Streptococcus pyogenes NOT DETECTED NOT DETECTED Final   Acinetobacter baumannii NOT DETECTED NOT DETECTED Final   Enterobacteriaceae species DETECTED (A) NOT DETECTED Final    Comment: Enterobacteriaceae represent a large family  of gram-negative bacteria, not a single organism. PHARMD E SINCLAIR [id number] 0830 MLM    Enterobacter cloacae complex NOT DETECTED NOT DETECTED Final   Escherichia coli NOT DETECTED NOT DETECTED Final   Klebsiella oxytoca NOT DETECTED NOT DETECTED Final   Klebsiella pneumoniae NOT DETECTED NOT DETECTED Final   Proteus species NOT DETECTED NOT DETECTED Final   Serratia marcescens DETECTED (A) NOT DETECTED Final    Comment: PHARMD E SINCLAIR [id number] 0830 MLM   Carbapenem resistance NOT DETECTED NOT DETECTED Final   Haemophilus influenzae NOT DETECTED NOT DETECTED Final   Neisseria meningitidis NOT DETECTED NOT DETECTED Final   Pseudomonas aeruginosa NOT DETECTED NOT DETECTED Final   Candida albicans NOT DETECTED NOT DETECTED Final   Candida glabrata NOT DETECTED NOT DETECTED Final   Candida krusei NOT DETECTED NOT DETECTED Final   Candida parapsilosis NOT DETECTED NOT DETECTED Final   Candida tropicalis NOT DETECTED NOT DETECTED Final    Comment: Performed at [facility], [place] [address] [place], [place] [address]  SARS Coronavirus 2 (CEPHEID - Performed in [place] hospital lab), Hosp Order     Status: None   Collection Time: 03/22/2019  7:18 PM   Specimen: Nasopharyngeal Swab  Result Value Ref Range Status   SARS Coronavirus 2 NEGATIVE NEGATIVE Final    Comment: (NOTE) If result is NEGATIVE SARS-CoV-2 target nucleic acids are NOT DETECTED. The SARS-CoV-2 RNA is generally detectable in upper and lower  respiratory specimens during the acute phase of infection. The lowest  concentration of SARS-CoV-2 viral copies this assay can detect is 250  copies / mL. A negative result does not preclude SARS-CoV-2  infection  and should not be used as the sole basis for treatment or other  patient management decisions.  A negative result may occur with  improper specimen collection / handling, submission of specimen other  than nasopharyngeal swab, presence of viral mutation(s) within the  areas targeted by this assay, and inadequate number of viral copies  (<250 copies / mL). A negative result must be combined with clinical  observations, patient history, and epidemiological information. If result is POSITIVE SARS-CoV-2 target nucleic acids are DETECTED. The SARS-CoV-2 RNA is generally detectable in upper and lower  respiratory specimens dur ing the acute phase of infection.  Positive  results are indicative of active infection with SARS-CoV-2.  Clinical  correlation with patient history and other diagnostic information is  necessary to determine patient infection status.  Positive results do  not rule out bacterial infection or co-infection with other viruses. If result is PRESUMPTIVE POSTIVE SARS-CoV-2 nucleic acids MAY BE PRESENT.   A presumptive positive result was obtained on the submitted specimen  and confirmed on repeat testing.  While 2019 novel coronavirus  (SARS-CoV-2) nucleic acids may be present in the submitted sample  additional confirmatory testing may be necessary for epidemiological  and / or clinical management purposes  to differentiate between  SARS-CoV-2 and other Sarbecovirus currently known to infect humans.  If clinically indicated additional testing with an alternate test  methodology [phone]) is advised. The SARS-CoV-2 RNA is generally  detectable in upper and lower respiratory sp ecimens during the acute  phase of infection. The expected result is Negative. Fact Sheet for Patients:  StrictlyIdeas.no Fact Sheet for Healthcare Providers: BankingDealers.co.za This test is not yet approved or cleared by the [place]  FDA and has been authorized for detection and/or diagnosis of SARS-CoV-2 by FDA under an Emergency Use Authorization (EUA).  This  EUA will remain in effect (meaning this test can be used) for the duration of the COVID-19 declaration under Section 564(b)(1) of the Act, 21 U.S.C. section 360bbb-3(b)(1), unless the authorization is terminated or revoked sooner. Performed at [facility], [place] [address] [place], [place] [address]   Blood Culture (routine x 2)     Status: Abnormal (Preliminary result)   Collection Time: 04/12/2019  8:53 PM   Specimen: BLOOD RIGHT FOREARM  Result Value Ref Range Status   Specimen Description BLOOD RIGHT FOREARM  Final   Special Requests   Final    BOTTLES DRAWN AEROBIC ONLY Blood Culture results may not be optimal due to an inadequate volume of blood received in culture bottles   Culture  Setup Time   Final    GRAM POSITIVE COCCI GRAM NEGATIVE RODS AEROBIC BOTTLE ONLY Performed at [facility], [place] [address], [place], [place] [address]    Culture STAPHYLOCOCCUS AUREUS GRAM NEGATIVE RODS  (A)  Final   Report Status PENDING  Incomplete  MRSA PCR Screening     Status: Abnormal   Collection Time: 04/17/19  1:10 AM   Specimen: Nasal Mucosa; Nasopharyngeal  Result Value Ref Range Status   MRSA by PCR POSITIVE (A) NEGATIVE Final    Comment:        The GeneXpert MRSA Assay (FDA approved for NASAL specimens only), is one component of a comprehensive MRSA colonization surveillance program. It is not intended to diagnose MRSA infection nor to guide or monitor treatment for MRSA infections. RESULT CALLED TO, READ BACK BY AND VERIFIED WITH: BUENDIA,A RN [phone] 04/17/2019 MITCHELL,L Performed at [facility], [place] [address], [place], [place] [address]          Radiology Studies: US Abdomen Complete  Result Date: 04/17/2019 CLINICAL DATA:  Acute renal liver failure.  IV drug user EXAM: ABDOMEN ULTRASOUND COMPLETE COMPARISON:  None.  FINDINGS: Gallbladder: There is sludge in the gallbladder. The gallbladder wall thickness measures 3 mm which is borderline. No Murphy's sign. There is ascites in the, some of which abuts gallbladder. No gallstones noted. Common bile duct: Diameter: 5.6 mm Liver: No focal lesion identified. Within normal limits in parenchymal echogenicity. Portal vein is patent on color Doppler imaging with normal direction of blood flow towards the liver. IVC: No abnormality visualized. Pancreas: Visualized portion unremarkable. Spleen: The spleen measures 19.52 cm in length with a volume 1120 cc consistent with splenomegaly. Right Kidney: Length: 11.8 cm. Increased echogenicity. Mild pelviectasis. Left Kidney: Length: 11.9 cm. Increased echogenicity. Mild pelviectasis. Abdominal aorta: No aneurysm visualized. Other findings: Pleural effusions and ascites identified IMPRESSION: 1. There is sludge in the gallbladder. The gallbladder wall is borderline in thickness. No Murphy's sign or stones. There is ascites in the abdomen, some of which abuts the gallbladder. If there is concern for acute cholecystitis, recommend a HIDA scan. 2. Splenomegaly. 3. Increased echogenicity in both kidneys consistent with history of chronic renal disease. There is mild bilateral pelviectasis without gross hydronephrosis. 4. Pleural effusions and ascites. Electronically Signed   By: Dorise Bullion III M.D   On: 04/17/2019 10:59   Dg Chest Port 1 View  Result Date: 04/04/2019 CLINICAL DATA:  Shortness of breath EXAM: PORTABLE CHEST 1 VIEW COMPARISON:  Feb 19, 2019 FINDINGS: The previously noted tunneled central venous catheter, tracheostomy tube, and enteric tube have all been removed. The heart size is enlarged. There are multifocal airspace opacities bilaterally. There is volume overload. There are small  bilateral pleural effusions, right greater than left. There is no pneumothorax. There is no acute osseous abnormality. There appear to be some old  right-sided rib fractures. IMPRESSION: 1. Multifocal airspace opacities bilaterally concerning for multifocal pneumonia (viral or bacterial). 2. Cardiomegaly with generalized volume overload. 3. Small bilateral pleural effusions are noted. Electronically Signed   By: Constance Holster M.D.   On: 03/26/2019 19:08   Vas [place] Lower Extremity Venous (dvt)  Result Date: 04/17/2019  Lower Venous Study Indications: Swelling. Other Indications: Polysubstance abuse, renal failure, failure to thrive,. Limitations: Poor ultrasound/tissue interface and patient unable to tolerate compression maneuvers. Comparison Study: 02/05/2019 Performing Technologist: Maudry Mayhew MHA, RDMS, RVT, RDCS  Examination Guidelines: A complete evaluation includes B-mode imaging, spectral Doppler, color Doppler, and power Doppler as needed of all accessible portions of each vessel. Bilateral testing is considered an integral part of a complete examination. Limited examinations for reoccurring indications may be performed as noted.  +---------+---------------+---------+-----------+----------+--------------+  RIGHT     Compressibility Phasicity Spontaneity Properties Summary         +---------+---------------+---------+-----------+----------+--------------+  CFV                       No        Yes                    Pulsatile flow  +---------+---------------+---------+-----------+----------+--------------+  FV Prox                             Yes                                    +---------+---------------+---------+-----------+----------+--------------+  FV Mid                              Yes                                    +---------+---------------+---------+-----------+----------+--------------+  FV Distal                           Yes                                    +---------+---------------+---------+-----------+----------+--------------+  PFV                                 Yes                                     +---------+---------------+---------+-----------+----------+--------------+  POP                       No        Yes                    Pulsatile flow  +---------+---------------+---------+-----------+----------+--------------+  PTV  Yes                                    +---------+---------------+---------+-----------+----------+--------------+  PERO                                Yes                                    +---------+---------------+---------+-----------+----------+--------------+   +---------+---------------+---------+-----------+----------+--------------+  LEFT      Compressibility Phasicity Spontaneity Properties Summary         +---------+---------------+---------+-----------+----------+--------------+  CFV                       No        Yes                    Pulsatile flow  +---------+---------------+---------+-----------+----------+--------------+  FV Prox                             Yes                                    +---------+---------------+---------+-----------+----------+--------------+  FV Mid                              Yes                                    +---------+---------------+---------+-----------+----------+--------------+  FV Distal                           Yes                                    +---------+---------------+---------+-----------+----------+--------------+  PFV                                 Yes                                    +---------+---------------+---------+-----------+----------+--------------+  POP                       No        Yes                    Pulsatile flow  +---------+---------------+---------+-----------+----------+--------------+  PTV                                 Yes                                    +---------+---------------+---------+-----------+----------+--------------+  PERO  Yes                                     +---------+---------------+---------+-----------+----------+--------------+  Summary: Right: No obvious evidence of acute thrombosis by color Doppler. A cystic structure is found in the popliteal fossa. Left: No obvious evidence of acute thrombosis by color Doppler. A cystic structure is found in the popliteal fossa.  Pulsatile venous flow is suggestive of possible elevated right heart pressure. No significant change when compared to prior study. *See table(s) above for measurements and observations. Electronically signed by Harold Barban MD on 04/17/2019 at 2:34:36 PM.    Final    [place] Ekg Site Rite  Result Date: 04/17/2019 If Site Rite image not attached, placement could not be confirmed due to current cardiac rhythm.       Scheduled Meds:  sodium chloride   Intravenous Once   Chlorhexidine Gluconate Cloth  6 each Topical Q0600   doxycycline  100 mg Oral Q12H   furosemide  40 mg Intravenous Q12H   mupirocin ointment  1 application Nasal BID   sodium bicarbonate  650 mg Oral TID   sodium chloride flush  3 mL Intravenous Q12H   sodium chloride flush  3 mL Intravenous Q12H   Continuous Infusions:  sodium chloride     ceFEPime (MAXIPIME) IV 1 g (04/17/19 2126)   DAPTOmycin (CUBICIN)  IV 500 mg (04/17/19 1215)     LOS: 2 days    Time spent: 35 mins.More than 50% of that time was spent in counseling and/or coordination of care.      Shelly Coss, MD [facility] Pager [phone]  If 7PM-7AM, please contact night-coverage [email or web address] Password [email or web address] 04/18/2019, 10:50 AM

## 2019-04-18 NOTE — Progress Notes (Addendum)
PULMONARY / CRITICAL CARE MEDICINE   NAME:  Molly [name], MRN:  [id number], DOB:  1986/08/27, LOS: 2 ADMISSION DATE:  04/06/2019, CONSULTATION DATE: 04/17/2019 REFERRING MD: [facility]ad, CHIEF COMPLAINT: Pain  BRIEF HISTORY:    History of polysubstance abuse with multiple interventions for MRSA bacteremia discitis. HISTORY OF PRESENT ILLNESS   33 year old with a long complicated history of polysubstance abuse leading to bacteremia, discitis, intubation and tracheostomy and long history of multiple antimicrobial therapy interventions.  She was recently discharged home in May 2020 and returns have not resumed IV drug abuse.  Blood cultures are positive for MRSA and Serratia.  She complains of back pain and pain all over.  She is awake alert in no acute distress at time of examination on 04/17/2019.  Her past medical history as well documented below.  We suspect that she will become worse and require return to the ICU in the future if she proves refractory to antimicrobial interventions.  She will most likely need a transesophageal echocardiogram again.  Infectious diseases following.  Pulmonary critical care will follow along. SIGNIFICANT PAST MEDICAL HISTORY   Polysubstance abuse Endocarditis Bacteremia Tracheostomy Failure to thrive Renal failure  SIGNIFICANT EVENTS:   STUDIES:   630 abdominal ultrasound>> sludge in gallbladder.  Ascites in abdomen somewhat which is adjacent to gallbladder.  Splenomegaly is noted.  Increased echogenicity of both kidneys consistent with chronic renal disease.  Pleural effusions and ascites CULTURES:  627 blood cultures x2+ for MRSA Serratia  ANTIBIOTICS:  629 Maxipime>> 629 Cubicin>> 629 Zyvox>> LINES/TUBES:    CONSULTANTS:  03/21/2019 pulmonary critical care SUBJECTIVE:  Frail 33 year old who is returned to IV drug abuse  CONSTITUTIONAL: BP (!) 103/92 (BP Location: Left Arm)   Pulse (!) 101   Temp 97.6 F (36.4 C) (Oral)   Resp (!) 26   Ht 5\' 1"   (1.549 m)   Wt 53 kg   SpO2 100%   BMI 22.08 kg/m   I/O last 3 completed shifts: In: 1469.6 [I.V.:272.3; Blood:604; IV Piggyback:593.3] Out: 150 [Urine:150]        PHYSICAL EXAM: General: Frail female appears much older than stated age 23: Old trach scar is noted Neuro: Awake alert moving all extremities crying out in pain CV: s1s2 rrr, no m/r/g PULM: even/non-labored, lungs bilaterally diminished in bases WC:BJSE, non-tender, bsx4 active  Extremities: warm/dry, 1+ edema  Skin: Old wounds on spine well-healed   RESOLVED PROBLEM LIST   ASSESSMENT AND PLAN   Bacteremia in the setting of IV drug abuse with a past medical history of bacteremia and endocarditis Blood cultures positive for staph aureus methicillin resistance Enterobacter Serratia marcescens Questionable sepsis Antimicrobial therapy per ID Hemodynamically stable on progressive care unit.  Transition to ICU if needed. Procalcitonin noted to be elevated Avoid diuresis   Juandice bilirubin is noted to be 8.0 Monitor LFTs       Thrombocytopenia questionable origin.  Platelets noted to be 8 with normal platelets 1 month ago of 202.  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  Questionable component of pneumonia versus IV drug  abuse 04/18/2019 no respiratory distress Maintain O2 sats greater than 92% Currently on antimicrobial therapy for questionable pneumonia Should she worsen she can be transferred to intensive care unit.    SUMMARY OF TODAY'S PLAN:  Evaluated by pulmonary critical care Elevated procalcitonin noted Hemodynamically stable Awake alert  Best Practice / Goals of Care / Disposition.   DVT PROPHYLAXIS: No anticoagulation secondary to thrombocytopenia with platelets of 80 SUP: PPI as needed NUTRITION: Diet as tolerated MOBILITY: Currently on bedrest GOALS OF CARE: Currently full code FAMILY DISCUSSIONS: 04/18/2019 patient updated at bedside DISPOSITION 04/17/2019 currently in 6 e 21.  04/18/2019 remains in 60s awake alert hemodynamically stable  LABS  Glucose Recent Labs  Lab 04/17/19 0753 04/18/19 0751  GLUCAP 73 71    BMET Recent Labs  Lab 04/04/2019 2053 04/17/19 0721 04/17/19 1422 04/17/19 2119 04/18/19 0201  NA 127* 130* 130* 131* 133*  K 2.8* 3.9  --   --  3.9  CL 97* 101  --   --  104  CO2 16* 13*  --   --  14*  BUN 53* 55*  --   --  66*  CREATININE 2.39* 2.49*  --   --  2.61*  GLUCOSE 79 76  --   --  66*    Liver Enzymes Recent Labs  Lab 03/27/2019 2053 04/17/19 0721 04/18/19 0201  AST 21 17 14*  ALT 8 10 10   ALKPHOS 80 61 58  BILITOT 5.4* 6.4* 8.0*  ALBUMIN 1.6* 1.6* 1.4*    Electrolytes Recent Labs  Lab 03/23/2019 2053 04/17/19 0721 04/18/19 0201  CALCIUM 7.3* 7.6* 7.5*  MG  --  1.4*  --     CBC Recent Labs  Lab 04/17/19 0721 04/17/19 1422 04/18/19 0201  WBC 5.8 6.4 6.1  HGB 9.3* 8.8* 9.0*  HCT 29.7* 26.9* 28.1*  PLT 9*  9* 7* 11*  10*    ABG No results for input(s): PHART, PCO2ART, PO2ART in the last 168 hours.  Coag's Recent Labs  Lab 04/15/2019 2053 04/17/19 0721 04/18/19 0201  APTT  --  51* 44*  INR 1.8* 2.2* 2.2*  2.1*    Sepsis Markers Recent Labs  Lab 04/17/19 0721 04/17/19 0742 04/17/19 1440 04/18/19 0201   LATICACIDVEN  --  5.5* 3.9* 3.4*  PROCALCITON 46.86  --   --  47.37   Cardiac Enzymes No results for input(s): TROPONINI, PROBNP in the last 168 hours.  Richardson Landry Minor ACNP Maryanna Shape PCCM Pager [phone] till 1 pm If no answer page [phone] 04/18/2019, 11:39 AM  Attending Note:  33 year old IVDA female who presents with endocarditis again.  No events overnight, down to 2L [place].  On exam, lungs with coarse BS diffusely.  I reviewed CXR myself, infiltrate noted.  Discussed with PCCM-NP.  Hypoxemia:  - Titrate O2 for sat of 88-92%  - May need an ambulatory desaturation study prior to going home for home O2  Endocarditis:  - Abx as ordered  - F/U on cultures  - ?CVTS involvement  Pulmonary infiltrate:  - Abx as ordered  IVDA:  - Will need rehab prior to discharge  PCCM will sign off, please call back if needed  Patient seen and examined, agree with above note.  I dictated the care and orders written for this patient under  my direction.  Rush Farmer, [place]

## 2019-04-18 NOTE — Progress Notes (Addendum)
HEMATOLOGY-ONCOLOGY PROGRESS NOTE  SUBJECTIVE: Reports that pain is better compared to yesterday. Denies bleeding. Nursing has not noticed any bleeding either. Remains afebrile.   REVIEW OF SYSTEMS:   Constitutional: Denies fevers, chills  Respiratory: Denies cough, dyspnea or wheezes Cardiovascular: Denies palpitation, chest discomfort Gastrointestinal:  Denies nausea, heartburn or change in bowel habits Skin: Denies abnormal skin rashes  Extremities: Has ongoing LE edema.  MSK: still having back pain, but better compared with yesterday.  All other systems were reviewed with the patient and are negative.  I have reviewed the past medical history, past surgical history, social history and family history with the patient and they are unchanged from previous note.   PHYSICAL EXAMINATION:  Vitals:   04/18/19 1151 04/18/19 1159  BP: (!) 150/109 (!) 154/107  Pulse: (!) 106   Resp:    Temp: 97.6 F (36.4 C)   SpO2: 93% 94%   Filed Weights   04/12/2019 1842 04/17/19 0032 04/18/19 0415  Weight: 108 lb (49 kg) 113 lb 15.7 oz (51.7 kg) 116 lb 13.5 oz (53 kg)    Intake/Output from previous day: 06/30 0701 - 07/01 0700 In: 842.3 [I.V.:272.3; Blood:400; IV Piggyback:170] Out: -   GENERAL: Alert, chronically ill appearing. No distress.  EYES: icteric OROPHARYNX: No thrush, no bleeding LUNGS: clear to auscultation and percussion with normal breathing effort HEART: Tachycardic. Still has BLE edema, but improved ABDOMEN:abdomen soft, spleen palpable  NEURO: alert & oriented x 3   LABORATORY DATA:  I have reviewed the data as listed CMP Latest Ref Rng & Units 04/18/2019 04/17/2019 04/17/2019  Glucose 70 - 99 mg/dL 66(L) - -  BUN 6 - 20 mg/dL 66(H) - -  Creatinine 0.44 - 1.00 mg/dL 2.61(H) - -  Sodium 135 - 145 mmol/L 133(L) 131(L) 130(L)  Potassium 3.5 - 5.1 mmol/L 3.9 - -  Chloride 98 - 111 mmol/L 104 - -  CO2 22 - 32 mmol/L 14(L) - -  Calcium 8.9 - 10.3 mg/dL 7.5(L) - -  Total  Protein 6.5 - 8.1 g/dL 5.0(L) - -  Total Bilirubin 0.3 - 1.2 mg/dL 8.0(H) - -  Alkaline Phos 38 - 126 U/L 58 - -  AST 15 - 41 U/L 14(L) - -  ALT 0 - 44 U/L 10 - -    Lab Results  Component Value Date   WBC 6.1 04/18/2019   HGB 9.0 (L) 04/18/2019   HCT 28.1 (L) 04/18/2019   MCV 91.2 04/18/2019   PLT 10 (LL) 04/18/2019   PLT 11 (LL) 04/18/2019   NEUTROABS 5.0 04/18/2019    Mr Lumbar Spine Wo Contrast  Result Date: 04/05/2019 CLINICAL DATA:  Bacteremia, history of epidural abscess EXAM: MRI LUMBAR SPINE WITHOUT CONTRAST TECHNIQUE: Multiplanar, multisequence MR imaging of the lumbar spine was performed. No intravenous contrast was administered. COMPARISON:  None. FINDINGS: Segmentation:  Standard. Alignment: No static listhesis. Severe dextroscoliosis of the thoracolumbar spine. Vertebrae: No fracture, evidence of acute discitis, or bone lesion. Old healed discitis at L1-2. Significant interval improvement in discitis at T12-L1 with resolution of the paravertebral and intra discal fluid with mild residual marrow edema along the anterior endplates which is likely reactive. Conus medullaris and cauda equina: Conus extends to the T12 level. Conus and cauda equina appear normal. Paraspinal and other soft tissues: No acute paraspinal abnormality. Diffuse paraspinal muscle atrophy. Disc levels: Disc spaces: Degenerative disc disease with severe disc height loss at L1-2 consistent with he will discitis. Mild degenerative disc disease with disc height  loss and reactive endplate edema at W10-U7. T12-L1: Minimal broad-based disc bulge. No evidence of neural foraminal stenosis. No central canal stenosis. L1-L2: No significant disc bulge. No evidence of neural foraminal stenosis. No central canal stenosis. L2-L3: No significant disc bulge. No evidence of neural foraminal stenosis. No central canal stenosis. L3-L4: No significant disc bulge. No evidence of neural foraminal stenosis. No central canal stenosis.  Mild right facet arthropathy. L4-L5: Mild broad-based disc bulge. Mild bilateral facet arthropathy. No evidence of neural foraminal stenosis. No central canal stenosis. L5-S1: No significant disc bulge. No evidence of neural foraminal stenosis. No central canal stenosis. IMPRESSION: 1. Old healed discitis at L1-2. Significant interval improvement in discitis at T12-L1 with resolution of the paravertebral and intradiscal fluid with mild residual marrow edema along the anterior endplates which is likely reactive. No evidence of acute discitis-osteomyelitis of the lumbar spine. Electronically Signed   By: Kathreen Devoid   On: 04/05/2019 12:45   US Abdomen Complete  Result Date: 04/17/2019 CLINICAL DATA:  Acute renal liver failure.  IV drug user EXAM: ABDOMEN ULTRASOUND COMPLETE COMPARISON:  None. FINDINGS: Gallbladder: There is sludge in the gallbladder. The gallbladder wall thickness measures 3 mm which is borderline. No Murphy's sign. There is ascites in the, some of which abuts gallbladder. No gallstones noted. Common bile duct: Diameter: 5.6 mm Liver: No focal lesion identified. Within normal limits in parenchymal echogenicity. Portal vein is patent on color Doppler imaging with normal direction of blood flow towards the liver. IVC: No abnormality visualized. Pancreas: Visualized portion unremarkable. Spleen: The spleen measures 19.52 cm in length with a volume 1120 cc consistent with splenomegaly. Right Kidney: Length: 11.8 cm. Increased echogenicity. Mild pelviectasis. Left Kidney: Length: 11.9 cm. Increased echogenicity. Mild pelviectasis. Abdominal aorta: No aneurysm visualized. Other findings: Pleural effusions and ascites identified IMPRESSION: 1. There is sludge in the gallbladder. The gallbladder wall is borderline in thickness. No Murphy's sign or stones. There is ascites in the abdomen, some of which abuts the gallbladder. If there is concern for acute cholecystitis, recommend a HIDA scan. 2.  Splenomegaly. 3. Increased echogenicity in both kidneys consistent with history of chronic renal disease. There is mild bilateral pelviectasis without gross hydronephrosis. 4. Pleural effusions and ascites. Electronically Signed   By: Dorise Bullion III M.D   On: 04/17/2019 10:59   Dg Chest Port 1 View  Result Date: 03/22/2019 CLINICAL DATA:  Shortness of breath EXAM: PORTABLE CHEST 1 VIEW COMPARISON:  Feb 19, 2019 FINDINGS: The previously noted tunneled central venous catheter, tracheostomy tube, and enteric tube have all been removed. The heart size is enlarged. There are multifocal airspace opacities bilaterally. There is volume overload. There are small bilateral pleural effusions, right greater than left. There is no pneumothorax. There is no acute osseous abnormality. There appear to be some old right-sided rib fractures. IMPRESSION: 1. Multifocal airspace opacities bilaterally concerning for multifocal pneumonia (viral or bacterial). 2. Cardiomegaly with generalized volume overload. 3. Small bilateral pleural effusions are noted. Electronically Signed   By: Constance Holster M.D.   On: 04/09/2019 19:08   Vas [place] Lower Extremity Venous (dvt)  Result Date: 04/17/2019  Lower Venous Study Indications: Swelling. Other Indications: Polysubstance abuse, renal failure, failure to thrive,. Limitations: Poor ultrasound/tissue interface and patient unable to tolerate compression maneuvers. Comparison Study: 02/05/2019 Performing Technologist: Maudry Mayhew MHA, RDMS, RVT, RDCS  Examination Guidelines: A complete evaluation includes B-mode imaging, spectral Doppler, color Doppler, and power Doppler as needed of all accessible portions of each  vessel. Bilateral testing is considered an integral part of a complete examination. Limited examinations for reoccurring indications may be performed as noted.  +---------+---------------+---------+-----------+----------+--------------+ RIGHT     CompressibilityPhasicitySpontaneityPropertiesSummary        +---------+---------------+---------+-----------+----------+--------------+ CFV                     No       Yes                  Pulsatile flow +---------+---------------+---------+-----------+----------+--------------+ FV Prox                          Yes                                 +---------+---------------+---------+-----------+----------+--------------+ FV Mid                           Yes                                 +---------+---------------+---------+-----------+----------+--------------+ FV Distal                        Yes                                 +---------+---------------+---------+-----------+----------+--------------+ PFV                              Yes                                 +---------+---------------+---------+-----------+----------+--------------+ POP                     No       Yes                  Pulsatile flow +---------+---------------+---------+-----------+----------+--------------+ PTV                              Yes                                 +---------+---------------+---------+-----------+----------+--------------+ PERO                             Yes                                 +---------+---------------+---------+-----------+----------+--------------+   +---------+---------------+---------+-----------+----------+--------------+ LEFT     CompressibilityPhasicitySpontaneityPropertiesSummary        +---------+---------------+---------+-----------+----------+--------------+ CFV                     No       Yes                  Pulsatile flow +---------+---------------+---------+-----------+----------+--------------+ FV Prox  Yes                                 +---------+---------------+---------+-----------+----------+--------------+ FV Mid                           Yes                                  +---------+---------------+---------+-----------+----------+--------------+ FV Distal                        Yes                                 +---------+---------------+---------+-----------+----------+--------------+ PFV                              Yes                                 +---------+---------------+---------+-----------+----------+--------------+ POP                     No       Yes                  Pulsatile flow +---------+---------------+---------+-----------+----------+--------------+ PTV                              Yes                                 +---------+---------------+---------+-----------+----------+--------------+ PERO                             Yes                                 +---------+---------------+---------+-----------+----------+--------------+  Summary: Right: No obvious evidence of acute thrombosis by color Doppler. A cystic structure is found in the popliteal fossa. Left: No obvious evidence of acute thrombosis by color Doppler. A cystic structure is found in the popliteal fossa.  Pulsatile venous flow is suggestive of possible elevated right heart pressure. No significant change when compared to prior study. *See table(s) above for measurements and observations. Electronically signed by Harold Barban MD on 04/17/2019 at 74:34:36 PM.    Final    [place] Ekg Site Rite  Result Date: 04/17/2019 If Site Rite image not attached, placement could not be confirmed due to current cardiac rhythm.   ASSESSMENT AND PLAN: 1. Thrombocytopenia secondary to DIC and sepsis 2.  Bacteremia in the setting of IV drug abuse with a past medical history including bacteremia and endocarditis.  Blood cultures positive for MRSA and Serratia marcescens. 3.  Hyperbilirubinemia secondary to hemolysis 4.  Acute kidney injury superimposed on chronic kidney disease 5.  Anemia secondary to bacteremia, DIC, chronic disease, and renal failure 6.   Splenomegaly-chronic 7.  Tricuspid endocarditis with tricuspid regurgitation 8.  Anasarca 9.  Coagulopathy secondary to DIC, antibiotics, and malnutrition  Molly [name] appears  stable. She has no bleeding. Hemoglobin remains stable. Platelets with slight improvement following 2 units of platelets on 04/17/2019. Creatinine and T Bilirubin are slightly worse today.  1.  Treat underlying infection.  Infectious disease is following this patient. 2.  Recommend platelet transfusion for platelet count less than 10,000 or active bleeding. No transfusion is indicated today. 3.  Recommend FFP/cryoprecipitate if active bleeding or if fibrinogen is less than 100.  No FFP or cryoprecipitate needed today. Will recheck DIC panel again in the morning. 4.  Anemia is likely multifactorial related to her acute illness, chronic kidney disease, and hemolysis. Absolute reticulocyte count is low.  Her hemoglobin is consistent with baseline.  No transfusion is indicated today.    LOS: 2 days   Mikey Bussing, DNP, AGPCNP-BC, AOCNP 04/18/19 Molly [name] appears unchanged.  No bleeding.  The severe thrombocytopenia is very likely related to consumption with DIC and bone marrow suppression from sepsis.  She had similar severe thrombocytopenia when she was admitted with sepsis in April.  The coagulopathy is secondary to DIC, polypharmacy, and malnutrition.  She is not bleeding at present.  There is no indication for transfusion support today.

## 2019-04-18 NOTE — Progress Notes (Signed)
  Echocardiogram 2D Echocardiogram has been performed.  Molly [name] 04/18/2019, 2:27 PM

## 2019-04-18 DEATH — deceased

## 2019-04-19 ENCOUNTER — Encounter (HOSPITAL_COMMUNITY): Payer: Self-pay | Admitting: Internal Medicine

## 2019-04-19 ENCOUNTER — Inpatient Hospital Stay: Payer: Self-pay

## 2019-04-19 ENCOUNTER — Inpatient Hospital Stay (HOSPITAL_COMMUNITY): Payer: Medicaid Other

## 2019-04-19 DIAGNOSIS — I5041 Acute combined systolic (congestive) and diastolic (congestive) heart failure: Secondary | ICD-10-CM

## 2019-04-19 DIAGNOSIS — R6521 Severe sepsis with septic shock: Secondary | ICD-10-CM

## 2019-04-19 DIAGNOSIS — I368 Other nonrheumatic tricuspid valve disorders: Secondary | ICD-10-CM

## 2019-04-19 HISTORY — PX: IR US GUIDE VASC ACCESS RIGHT: IMG2390

## 2019-04-19 HISTORY — PX: IR FLUORO GUIDE CV LINE RIGHT: IMG2283

## 2019-04-19 LAB — COMPREHENSIVE METABOLIC PANEL
ALT: 8 U/L (ref 0–44)
AST: 14 U/L — ABNORMAL LOW (ref 15–41)
Albumin: 1.4 g/dL — ABNORMAL LOW (ref 3.5–5.0)
Alkaline Phosphatase: 48 U/L (ref 38–126)
Anion gap: 13 (ref 5–15)
BUN: 85 mg/dL — ABNORMAL HIGH (ref 6–20)
CO2: 19 mmol/L — ABNORMAL LOW (ref 22–32)
Calcium: 7.6 mg/dL — ABNORMAL LOW (ref 8.9–10.3)
Chloride: 104 mmol/L (ref 98–111)
Creatinine, Ser: 2.67 mg/dL — ABNORMAL HIGH (ref 0.44–1.00)
GFR calc Af Amer: 27 mL/min — ABNORMAL LOW (ref >60)
GFR calc non Af Amer: 23 mL/min — ABNORMAL LOW (ref >60)
Glucose, Bld: 85 mg/dL (ref 70–99)
Potassium: 3.5 mmol/L (ref 3.5–5.1)
Sodium: 136 mmol/L (ref 135–145)
Total Bilirubin: 6.3 mg/dL — ABNORMAL HIGH (ref 0.3–1.2)
Total Protein: 5.3 g/dL — ABNORMAL LOW (ref 6.5–8.1)

## 2019-04-19 LAB — CULTURE, BLOOD (ROUTINE X 2)

## 2019-04-19 LAB — CBC WITH DIFFERENTIAL/PLATELET
Abs Immature Granulocytes: 0 10*3/uL (ref 0.00–0.07)
Basophils Absolute: 0 10*3/uL (ref 0.0–0.1)
Basophils Relative: 0 %
Eosinophils Absolute: 0 10*3/uL (ref 0.0–0.5)
Eosinophils Relative: 0 %
HCT: 23.8 % — ABNORMAL LOW (ref 36.0–46.0)
Hemoglobin: 7.6 g/dL — ABNORMAL LOW (ref 12.0–15.0)
Lymphocytes Relative: 14 %
Lymphs Abs: 0.5 10*3/uL — ABNORMAL LOW (ref 0.7–4.0)
MCH: 29.7 pg (ref 26.0–34.0)
MCHC: 31.9 g/dL (ref 30.0–36.0)
MCV: 93 fL (ref 80.0–100.0)
Monocytes Absolute: 0.1 10*3/uL (ref 0.1–1.0)
Monocytes Relative: 2 %
Neutro Abs: 3.2 10*3/uL (ref 1.7–7.7)
Neutrophils Relative %: 84 %
Platelets: 5 10*3/uL — CL (ref 150–400)
RBC: 2.56 MIL/uL — ABNORMAL LOW (ref 3.87–5.11)
RDW: 17.4 % — ABNORMAL HIGH (ref 11.5–15.5)
WBC: 3.8 10*3/uL — ABNORMAL LOW (ref 4.0–10.5)
nRBC: 0.5 % — ABNORMAL HIGH (ref 0.0–0.2)

## 2019-04-19 LAB — BILIRUBIN, FRACTIONATED(TOT/DIR/INDIR)
Bilirubin, Direct: 3.7 mg/dL — ABNORMAL HIGH (ref 0.0–0.2)
Indirect Bilirubin: 2.7 mg/dL — ABNORMAL HIGH (ref 0.3–0.9)
Total Bilirubin: 6.4 mg/dL — ABNORMAL HIGH (ref 0.3–1.2)

## 2019-04-19 LAB — PATHOLOGIST SMEAR REVIEW

## 2019-04-19 LAB — PROCALCITONIN: Procalcitonin: 34.92 ng/mL

## 2019-04-19 LAB — PROTIME-INR
INR: 1.7 — ABNORMAL HIGH (ref 0.8–1.2)
Prothrombin Time: 19.8 seconds — ABNORMAL HIGH (ref 11.4–15.2)

## 2019-04-19 LAB — DIC (DISSEMINATED INTRAVASCULAR COAGULATION)PANEL
D-Dimer, Quant: 16.27 ug/mL-FEU — ABNORMAL HIGH (ref 0.00–0.50)
Fibrinogen: 101 mg/dL — ABNORMAL LOW (ref 210–475)
INR: 1.7 — ABNORMAL HIGH (ref 0.8–1.2)
Platelets: 6 10*3/uL — CL (ref 150–400)
Prothrombin Time: 19.9 seconds — ABNORMAL HIGH (ref 11.4–15.2)
Smear Review: NONE SEEN
aPTT: 45 seconds — ABNORMAL HIGH (ref 24–36)

## 2019-04-19 LAB — LACTIC ACID, PLASMA: Lactic Acid, Venous: 1.6 mmol/L (ref 0.5–1.9)

## 2019-04-19 MED ORDER — LIDOCAINE HCL 1 % IJ SOLN
INTRAMUSCULAR | Status: AC
  Filled 2019-04-19: qty 20

## 2019-04-19 MED ORDER — SODIUM CHLORIDE 0.9 % IV SOLN
300.0000 mg | Freq: Three times a day (TID) | INTRAVENOUS | Status: DC
  Administered 2019-04-19 – 2019-04-20 (×3): 300 mg via INTRAVENOUS
  Filled 2019-04-19 (×7): qty 300

## 2019-04-19 MED ORDER — LIDOCAINE HCL 1 % IJ SOLN
INTRAMUSCULAR | Status: DC | PRN
  Administered 2019-04-19: 10 mL

## 2019-04-19 MED ORDER — SODIUM CHLORIDE 0.9% IV SOLUTION
Freq: Once | INTRAVENOUS | Status: AC
  Administered 2019-04-19: 22:00:00 via INTRAVENOUS

## 2019-04-19 NOTE — Progress Notes (Signed)
Patient has an order for PICC placement today. Text message Dr. Tawanna Solo that IR does her PICC and he will placed the order for PICC placement with IR. Floor RN notified.

## 2019-04-19 NOTE — Progress Notes (Signed)
Late Entry:  10am: Patient drowsy but arousable, answered orientation questions during AM assessments.  Spoke to Dr Tawanna Solo at the nurse's station about this.  He stated this is not a change.

## 2019-04-19 NOTE — Progress Notes (Addendum)
HEMATOLOGY-ONCOLOGY PROGRESS NOTE  SUBJECTIVE: Continues to deny bleeding.  Still having ongoing pain.  Labs from this morning have not yet been drawn.  The patient was stuck 3 times and they were unable to obtain blood samples.  PICC line placement is pending.  REVIEW OF SYSTEMS:   Constitutional: Denies fevers, chills  Respiratory: Denies cough, dyspnea or wheezes Cardiovascular: Denies palpitation, chest discomfort Gastrointestinal:  Denies nausea, heartburn or change in bowel habits Skin: Denies abnormal skin rashes  Extremities: Has ongoing LE edema.  MSK: still having back pain, but better compared with yesterday.  All other systems were reviewed with the patient and are negative.  I have reviewed the past medical history, past surgical history, social history and family history with the patient and they are unchanged from previous note.   PHYSICAL EXAMINATION:  Vitals:   04/18/19 2032 04/19/19 0530  BP: (!) 129/97 110/87  Pulse: (!) 111 (!) 115  Resp: (!) 35 (!) 32  Temp: 98.3 F (36.8 C) 98.9 F (37.2 C)  SpO2: 95% 97%   Filed Weights   04/17/19 0032 04/18/19 0415 04/19/19 0535  Weight: 113 lb 15.7 oz (51.7 kg) 116 lb 13.5 oz (53 kg) 118 lb 2.7 oz (53.6 kg)    Intake/Output from previous day: 07/01 0701 - 07/02 0700 In: 120 [P.O.:120] Out: 925 [Urine:925]  GENERAL: Alert, chronically ill appearing. No distress.  EYES: icteric OROPHARYNX: No thrush, no bleeding LUNGS: clear to auscultation and percussion with normal breathing effort HEART: Tachycardic. Still has BLE edema, but improved ABDOMEN:abdomen soft, spleen palpable  NEURO: alert & oriented x 3  Skin: No petechiae, small ecchymoses at IV puncture sites  LABORATORY DATA:  I have reviewed the data as listed CMP Latest Ref Rng & Units 04/18/2019 04/17/2019 04/17/2019  Glucose 70 - 99 mg/dL 66(L) - -  BUN 6 - 20 mg/dL 66(H) - -  Creatinine 0.44 - 1.00 mg/dL 2.61(H) - -  Sodium 135 - 145 mmol/L 133(L) 131(L)  130(L)  Potassium 3.5 - 5.1 mmol/L 3.9 - -  Chloride 98 - 111 mmol/L 104 - -  CO2 22 - 32 mmol/L 14(L) - -  Calcium 8.9 - 10.3 mg/dL 7.5(L) - -  Total Protein 6.5 - 8.1 g/dL 5.0(L) - -  Total Bilirubin 0.3 - 1.2 mg/dL 8.0(H) - -  Alkaline Phos 38 - 126 U/L 58 - -  AST 15 - 41 U/L 14(L) - -  ALT 0 - 44 U/L 10 - -    Lab Results  Component Value Date   WBC 6.1 04/18/2019   HGB 9.0 (L) 04/18/2019   HCT 28.1 (L) 04/18/2019   MCV 91.2 04/18/2019   PLT 10 (LL) 04/18/2019   PLT 11 (LL) 04/18/2019   NEUTROABS 5.0 04/18/2019    Mr Lumbar Spine Wo Contrast  Result Date: 04/05/2019 CLINICAL DATA:  Bacteremia, history of epidural abscess EXAM: MRI LUMBAR SPINE WITHOUT CONTRAST TECHNIQUE: Multiplanar, multisequence MR imaging of the lumbar spine was performed. No intravenous contrast was administered. COMPARISON:  None. FINDINGS: Segmentation:  Standard. Alignment: No static listhesis. Severe dextroscoliosis of the thoracolumbar spine. Vertebrae: No fracture, evidence of acute discitis, or bone lesion. Old healed discitis at L1-2. Significant interval improvement in discitis at T12-L1 with resolution of the paravertebral and intra discal fluid with mild residual marrow edema along the anterior endplates which is likely reactive. Conus medullaris and cauda equina: Conus extends to the T12 level. Conus and cauda equina appear normal. Paraspinal and other soft tissues: No  acute paraspinal abnormality. Diffuse paraspinal muscle atrophy. Disc levels: Disc spaces: Degenerative disc disease with severe disc height loss at L1-2 consistent with he will discitis. Mild degenerative disc disease with disc height loss and reactive endplate edema at S96-G8. T12-L1: Minimal broad-based disc bulge. No evidence of neural foraminal stenosis. No central canal stenosis. L1-L2: No significant disc bulge. No evidence of neural foraminal stenosis. No central canal stenosis. L2-L3: No significant disc bulge. No evidence of  neural foraminal stenosis. No central canal stenosis. L3-L4: No significant disc bulge. No evidence of neural foraminal stenosis. No central canal stenosis. Mild right facet arthropathy. L4-L5: Mild broad-based disc bulge. Mild bilateral facet arthropathy. No evidence of neural foraminal stenosis. No central canal stenosis. L5-S1: No significant disc bulge. No evidence of neural foraminal stenosis. No central canal stenosis. IMPRESSION: 1. Old healed discitis at L1-2. Significant interval improvement in discitis at T12-L1 with resolution of the paravertebral and intradiscal fluid with mild residual marrow edema along the anterior endplates which is likely reactive. No evidence of acute discitis-osteomyelitis of the lumbar spine. Electronically Signed   By: Kathreen Devoid   On: 04/05/2019 12:45   US Abdomen Complete  Result Date: 04/17/2019 CLINICAL DATA:  Acute renal liver failure.  IV drug user EXAM: ABDOMEN ULTRASOUND COMPLETE COMPARISON:  None. FINDINGS: Gallbladder: There is sludge in the gallbladder. The gallbladder wall thickness measures 3 mm which is borderline. No Murphy's sign. There is ascites in the, some of which abuts gallbladder. No gallstones noted. Common bile duct: Diameter: 5.6 mm Liver: No focal lesion identified. Within normal limits in parenchymal echogenicity. Portal vein is patent on color Doppler imaging with normal direction of blood flow towards the liver. IVC: No abnormality visualized. Pancreas: Visualized portion unremarkable. Spleen: The spleen measures 19.52 cm in length with a volume 1120 cc consistent with splenomegaly. Right Kidney: Length: 11.8 cm. Increased echogenicity. Mild pelviectasis. Left Kidney: Length: 11.9 cm. Increased echogenicity. Mild pelviectasis. Abdominal aorta: No aneurysm visualized. Other findings: Pleural effusions and ascites identified IMPRESSION: 1. There is sludge in the gallbladder. The gallbladder wall is borderline in thickness. No Murphy's sign or  stones. There is ascites in the abdomen, some of which abuts the gallbladder. If there is concern for acute cholecystitis, recommend a HIDA scan. 2. Splenomegaly. 3. Increased echogenicity in both kidneys consistent with history of chronic renal disease. There is mild bilateral pelviectasis without gross hydronephrosis. 4. Pleural effusions and ascites. Electronically Signed   By: Dorise Bullion III M.D   On: 04/17/2019 10:59   US Renal  Result Date: 04/18/2019 CLINICAL DATA:  Acute on chronic renal injury EXAM: RENAL / URINARY TRACT ULTRASOUND COMPLETE COMPARISON:  01/20/2019 FINDINGS: Right Kidney: Renal measurements: 11.8 x 4.7 x 5.8 cm = volume: 169 mL. Diffuse increased echogenicity is identified. Mild pelviectasis is noted although no definitive obstructive changes are seen. Mild ascites is seen Left Kidney: Renal measurements: 11.8 x 5.0 x 6.3 cm = volume: 196 mL. Diffuse increased echogenicity is noted. Mild pelviectasis is noted although no definitive obstructive changes noted. Bladder: Not well visualized IMPRESSION: Increased echogenicity bilaterally similar to that seen on the prior exam. Mild ascites is noted. Note is also made of splenomegaly and gallbladder sludge similar to that seen on recent ultrasound of the abdomen (04/17/2019). Electronically Signed   By: Inez Catalina M.D.   On: 04/18/2019 17:30   Dg Chest Port 1 View  Result Date: 03/27/2019 CLINICAL DATA:  Shortness of breath EXAM: PORTABLE CHEST 1 VIEW COMPARISON:  Feb 19, 2019 FINDINGS: The previously noted tunneled central venous catheter, tracheostomy tube, and enteric tube have all been removed. The heart size is enlarged. There are multifocal airspace opacities bilaterally. There is volume overload. There are small bilateral pleural effusions, right greater than left. There is no pneumothorax. There is no acute osseous abnormality. There appear to be some old right-sided rib fractures. IMPRESSION: 1. Multifocal airspace opacities  bilaterally concerning for multifocal pneumonia (viral or bacterial). 2. Cardiomegaly with generalized volume overload. 3. Small bilateral pleural effusions are noted. Electronically Signed   By: Constance Holster M.D.   On: 03/23/2019 19:08   Vas [place] Lower Extremity Venous (dvt)  Result Date: 04/17/2019  Lower Venous Study Indications: Swelling. Other Indications: Polysubstance abuse, renal failure, failure to thrive,. Limitations: Poor ultrasound/tissue interface and patient unable to tolerate compression maneuvers. Comparison Study: 02/05/2019 Performing Technologist: Maudry Mayhew MHA, RDMS, RVT, RDCS  Examination Guidelines: A complete evaluation includes B-mode imaging, spectral Doppler, color Doppler, and power Doppler as needed of all accessible portions of each vessel. Bilateral testing is considered an integral part of a complete examination. Limited examinations for reoccurring indications may be performed as noted.  +---------+---------------+---------+-----------+----------+--------------+ RIGHT    CompressibilityPhasicitySpontaneityPropertiesSummary        +---------+---------------+---------+-----------+----------+--------------+ CFV                     No       Yes                  Pulsatile flow +---------+---------------+---------+-----------+----------+--------------+ FV Prox                          Yes                                 +---------+---------------+---------+-----------+----------+--------------+ FV Mid                           Yes                                 +---------+---------------+---------+-----------+----------+--------------+ FV Distal                        Yes                                 +---------+---------------+---------+-----------+----------+--------------+ PFV                              Yes                                 +---------+---------------+---------+-----------+----------+--------------+ POP                      No       Yes                  Pulsatile flow +---------+---------------+---------+-----------+----------+--------------+ PTV                              Yes                                 +---------+---------------+---------+-----------+----------+--------------+  PERO                             Yes                                 +---------+---------------+---------+-----------+----------+--------------+   +---------+---------------+---------+-----------+----------+--------------+ LEFT     CompressibilityPhasicitySpontaneityPropertiesSummary        +---------+---------------+---------+-----------+----------+--------------+ CFV                     No       Yes                  Pulsatile flow +---------+---------------+---------+-----------+----------+--------------+ FV Prox                          Yes                                 +---------+---------------+---------+-----------+----------+--------------+ FV Mid                           Yes                                 +---------+---------------+---------+-----------+----------+--------------+ FV Distal                        Yes                                 +---------+---------------+---------+-----------+----------+--------------+ PFV                              Yes                                 +---------+---------------+---------+-----------+----------+--------------+ POP                     No       Yes                  Pulsatile flow +---------+---------------+---------+-----------+----------+--------------+ PTV                              Yes                                 +---------+---------------+---------+-----------+----------+--------------+ PERO                             Yes                                 +---------+---------------+---------+-----------+----------+--------------+  Summary: Right: No obvious evidence of acute thrombosis by  color Doppler. A cystic structure is found in the popliteal fossa. Left: No obvious evidence of acute thrombosis by color Doppler. A cystic structure is found in the popliteal fossa.  Pulsatile venous flow is suggestive of possible  elevated right heart pressure. No significant change when compared to prior study. *See table(s) above for measurements and observations. Electronically signed by Harold Barban MD on 04/17/2019 at 24:34:36 PM.    Final    [place] Ekg Site Rite  Result Date: 04/19/2019 If Site Rite image not attached, placement could not be confirmed due to current cardiac rhythm.  [place] Ekg Site Rite  Result Date: 04/17/2019 If Site Rite image not attached, placement could not be confirmed due to current cardiac rhythm.   ASSESSMENT AND PLAN: 1. Thrombocytopenia secondary to DIC and sepsis 2.  Bacteremia in the setting of IV drug abuse with a past medical history including bacteremia and endocarditis.  Blood cultures positive for MRSA and Serratia marcescens. 3.  Hyperbilirubinemia secondary to hemolysis 4.  Acute kidney injury superimposed on chronic kidney disease 5.  Anemia secondary to bacteremia, DIC, chronic disease, and renal failure 6.  Splenomegaly-chronic 7.  Tricuspid endocarditis with tricuspid regurgitation 8.  Anasarca 9.  Coagulopathy secondary to DIC, antibiotics, and malnutrition  Ms. Weinmann appears unchanged. She has no bleeding.  She received 2 units of platelets on 04/17/2019.  Labs from today are pending.  PICC line placement is pending.  1.  Continue to treat underlying infection.  Infectious disease is following this patient.  Repeat blood cultures from 04/18/2019 are negative to date. 2.   The severe thrombocytopenia is very likely related to consumption with DIC and bone marrow suppression from sepsis.  She had similar severe thrombocytopenia when she was admitted with sepsis in April.Recommend platelet transfusion for platelet count less than 10,000 or active  bleeding.  CBC from today is pending. 3.  Recommend FFP/cryoprecipitate if active bleeding or if fibrinogen is less than 100.   DIC panel is pending. 4.  Anemia is likely multifactorial related to her acute illness, chronic kidney disease, and hemolysis. Absolute reticulocyte count is low.  Her hemoglobin has been consistent with her baseline.    CBC from today is pending.    LOS: 3 days   Mikey Bussing, DNP, AGPCNP-BC, AOCNP 04/19/19  Ms. Wernli was interviewed and examined.  There is no apparent bleeding.  Labs are pending today.  She will require PICC placement for IV access and lab draws.  We will follow-up the CBC and coagulation parameters later today.

## 2019-04-19 NOTE — Consult Note (Signed)
Cardiology Consultation:   Patient ID: Molly [name] MRN: [id number]; DOB: 25-Feb-1986  Admit date: 03/25/2019 Date of Consult: 04/19/2019  Primary Care Provider: Patient, No Pcp Per Primary Cardiologist: Johnsie Cancel  Primary Electrophysiologist:  None    Patient Profile:   Molly [name] is a 33 y.o. female with a hx of IV drug abuse, MRSA tricuspid valve endocarditis, history of epidural abscess, chronic renal insufficiency, seizures, noncompliance who presents to the emergency department for the evaluation of progressive leg swelling, shortness of breath and generalized body aches and pains who is being seen today for the evaluation of systolic heart failure at the request of Dr. Tawanna Solo.  History of Present Illness:   Molly [name] is a 33 year old with a long complicated history of polysubstance abuse leading to bacteremia, discitis, intubation and tracheostomy and long history of multiple antimicrobial therapy interventions.    Cardiology was consulted several days into her admission for newly diagnosed reduced ejection fraction in the setting of DIC and sepsis, with evidence of endocarditis on transthoracic echo.  Patient is a 33 year old female with history of IV drug abuse, MRSA tricuspid valve endocarditis, history of epidural abscess, chronic renal insufficiency, seizures, noncompliance who presents to the emergency department for the evaluation of progressive leg swelling, shortness of breath and generalized body aches and pains.  On presentation, she was found to be afebrile, tachycardic, tachypneic and hypotensive.  Chest x-ray was concerning for multifocal airspace opacities bilaterally suggesting multifocal pneumonia as well as cardiomegaly and volume overload.  She was found to have hyponatremia, hypokalemia, acute kidney injury.  New thrombocytopenia with platelets of 8000, anemia, elevated lactic acid and elevated INR. Blood cultures have been sent which has shown MRSA and Serratia.   She has been started on broad-spectrum antibiotics.  She is unable to provide any history on exam today, though she appears alert she is confused and at times somnolent.  She does withdraw to pain but is not able to appropriately respond to questions.  Her cardiovascular history includes previous tricuspid valve endocarditis on both the echocardiogram from June 2019 as well as April 2020 she has evidence of a large mobile vegetation on the tricuspid valve.  This is likely in keeping with her history of intravenous drug abuse.  Her ejection fraction in April 2020 was preserved and hyperdynamic.  However now appears to be reduced, around 25 to 30%.  She presents with anasarca in the setting of DIC and sepsis.  She has received fluids initially and then due to diffuse edema began to receive Lasix while in hospital.  She has responded to Lasix per nursing, but continues to have weeping skin.  She has severe tricuspid valve regurgitation as a result of damaged leaflets and again noted large mobile tricuspid valve vegetation.  No other valvular vegetations are noted. Heart Pathway Score:     Past Medical History:  Diagnosis Date   Abnormal urinalysis 04/22/2018   Abscess in epidural space of T12/L1 spine    Abscess of skin    buttock - most recently 2009/10   Acute cystitis with hematuria    Acute kidney failure (HCC)    Acute septic pulmonary embolism with acute cor pulmonale (June 2019) 04/22/2018   Cellulitis of multiple sites of right hand and fingers 07/07/2013   CKD (chronic kidney disease) stage 3, GFR 30-59 ml/min (HCC)    DIC (disseminated intravascular coagulation) ([place])    Drug-seeking behavior    Encounter for orogastric (OG) tube placement    Facial edema 03/21/2019  Hypersplenism    IV drug user    Leg edema 03/21/2019   Leukopenia    MRSA (methicillin resistant Staphylococcus aureus) infection    MRSA bacteremia    Multifocal pneumonia    Noncompliance 04/22/2018    Palliative care by specialist    Pancytopenia ([place])    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)    Sepsis ([place]) 01/20/2019   Sepsis with disseminated intravascular coagulopathy (DIC) ([place]) 01/20/2019   Sepsis with multi-organ dysfunction ([place]) 03/28/2019   Sepsis with multiple organ dysfunction (MOD) ([place]) 04/08/2018   Septic pulmonary embolism ([place])    Splenomegaly    Tobacco abuse     Past Surgical History:  Procedure Laterality Date   IR FLUORO GUIDE CV LINE RIGHT  02/05/2019   IR GENERIC HISTORICAL  07/19/2016   IR LUMBAR DISC ASPIRATION W/IMG GUIDE 07/19/2016 Arne Cleveland, MD [facility]-INTERV RAD   IR REMOVAL TUN CV CATH W/O FL  03/06/2019   IR US GUIDE VASC ACCESS RIGHT  02/05/2019   RADIOLOGY WITH ANESTHESIA N/A 04/11/2018   Procedure: MRI OF LUMBAR AND THORACIC SPINE WITHOUT CONTRAST WITH ANESTHESIA;  Surgeon: Radiologist, Medication, MD;  Location: [facility];  Service: Radiology;  Laterality: N/A;   TEE WITHOUT CARDIOVERSION N/A 07/04/2013   Procedure: TRANSESOPHAGEAL ECHOCARDIOGRAM (TEE);  Surgeon: Larey Dresser, MD;  Location[facility] ENDOSCOPY;  Service: Cardiovascular;  Laterality: N/A;   TONSILLECTOMY     33 years old     Home Medications:  Prior to Admission medications   Medication Sig Start Date End Date Taking? Authorizing Provider  buprenorphine-naloxone (SUBOXONE) 2-0.5 mg SUBL SL tablet Place 2 tablets under the tongue 2 (two) times a day. 03/07/19  Yes Sid Falcon, MD  clonazePAM (KLONOPIN) 0.5 MG tablet Take 1 tablet (0.5 mg total) by mouth 2 (two) times daily as needed for anxiety. 03/06/19 03/05/20 Yes Mercy Riding, MD    Inpatient Medications: Scheduled Meds:  sodium chloride   Intravenous Once   Chlorhexidine Gluconate Cloth  6 each Topical Q0600   furosemide  40 mg Intravenous Q12H   lidocaine       mupirocin ointment  1 application Nasal BID   sodium bicarbonate  650 mg Oral TID   sodium chloride flush  3 mL Intravenous Q12H   sodium chloride  flush  3 mL Intravenous Q12H   Continuous Infusions:  sodium chloride 250 mL (04/19/19 1248)   ceFTAROline (TEFLARO) IV 300 mg (04/19/19 1250)   PRN Meds: sodium chloride, fentaNYL (SUBLIMAZE) injection, lidocaine, ondansetron **OR** ondansetron (ZOFRAN) IV, polyethylene glycol, sodium chloride flush  Allergies:    Allergies  Allergen Reactions   Lactose Intolerance (Gi)    Vancomycin Other (See Comments)    Possible contributor to AKI and thrombocytopenia    Social History:   Social History   Socioeconomic History   Marital status: Single    Spouse name: Not on file   Number of children: Not on file   Years of education: Not on file   Highest education level: Not on file  Occupational History   Not on file  Social Needs   Financial resource strain: Not on file   Food insecurity    Worry: Not on file    Inability: Not on file   Transportation needs    Medical: Not on file    Non-medical: Not on file  Tobacco Use   Smoking status: Current Every Day Smoker    Packs/day: 0.50    Years: 7.00  Pack years: 3.50    Types: Cigarettes   Smokeless tobacco: Never Used   Tobacco comment: slowing down  Substance and Sexual Activity   Alcohol use: No   Drug use: Yes    Types: IV, Oxycodone    Comment: heroin   Sexual activity: Yes    Partners: Male    Birth control/protection: I.U.D.  Lifestyle   Physical activity    Days per week: Not on file    Minutes per session: Not on file   Stress: Not on file  Relationships   Social connections    Talks on phone: Not on file    Gets together: Not on file    Attends religious service: Not on file    Active member of club or organization: Not on file    Attends meetings of clubs or organizations: Not on file    Relationship status: Not on file   Intimate partner violence    Fear of current or ex partner: Not on file    Emotionally abused: Not on file    Physically abused: Not on file    Forced  sexual activity: Not on file  Other Topics Concern   Not on file  Social History Narrative   Not on file    Family History:    Family History  Problem Relation Age of Onset   Alcoholism Father      ROS:  Please see the history of present illness.   All other ROS reviewed and negative.     Physical Exam/Data:   Vitals:   04/18/19 2032 04/19/19 0530 04/19/19 0535 04/19/19 1744  BP: (!) 129/97 110/87  (!) 110/91  Pulse: (!) 111 (!) 115    Resp: (!) 35 (!) 32    Temp: 98.3 F (36.8 C) 98.9 F (37.2 C)  98.1 F (36.7 C)  TempSrc: Oral Oral  Oral  SpO2: 95% 97%  98%  Weight:   53.6 kg   Height:        Intake/Output Summary (Last 24 hours) at 04/19/2019 1855 Last data filed at 04/19/2019 1500 Gross per 24 hour  Intake --  Output 2000 ml  Net -2000 ml   Last 3 Weights 04/19/2019 04/18/2019 04/17/2019  Weight (lbs) 118 lb 2.7 oz 116 lb 13.5 oz 113 lb 15.7 oz  Weight (kg) 53.6 kg 53 kg 51.7 kg     Body mass index is 22.33 kg/m.  General:  Chronically ill appearing HEENT: normal Neck: JVP elevated while lying flat with V wave suggesting venous pressure reflection of severe TR. Vascular: No carotid bruits; FA pulses 2+ bilaterally without bruits  Cardiac:  normal S1, S2; RRR; 3/6 holosystolic murmur along left sternal border Lungs:  Diffuse crackles anteriorly Abd: soft, nontender, no hepatomegaly  Ext: 2+ bilateral edema Musculoskeletal:  No deformities, BUE and BLE strength normal and equal Skin: warm and dry, jaundiced Neuro:  no focal abnormalities noted, moves all extremities Psych:  Not oriented to person place or time. Confused. Mood unable to be assessed  EKG:  The EKG was personally reviewed and demonstrates:  Sinus tachycardia Telemetry:  Telemetry was personally reviewed and demonstrates:  Sinus tachycardia.   Relevant CV Studies: Echo 04/18/2019 1. The left ventricle has severely reduced systolic function, with an ejection fraction of 25-30%. The cavity size  was normal. Left ventricular diastolic Doppler parameters are consistent with impaired relaxation. Left ventricular diffuse hypokinesis.  2. Small pericardial effusion.  3. All leaflets of the tricuspid valve and the  chordal structure appear involved by large, mobile vegetation. Severe tricuspid regurgitation in the setting of incomplete coaptation of the damaged leaflets.  4. The aortic valve is tricuspid. Mild calcification of the aortic valve. No stenosis of the aortic valve. No aortic valve vegetation.  5. The aortic root is normal in size and structure.  6. No evidence of mitral valve stenosis. Trivial mitral regurgitation. No vegetation on the mitral valve.  7. No vegetation on the pulmonic valve.  8. The right ventricle has normal systolic function. The cavity was mildly enlarged. There is no increase in right ventricular wall thickness. D-shaped interventricular septum is suggestive of RV pressure/volume overload.  9. The inferior vena cava was dilated in size with <50% respiratory variability. PA systolic pressure 63 mmHg.  Laboratory Data:  High Sensitivity Troponin:  No results for input(s): TROPONINIHS in the last 720 hours.   Cardiac EnzymesNo results for input(s): TROPONINI in the last 168 hours. No results for input(s): TROPIPOC in the last 168 hours.  Chemistry Recent Labs  Lab 04/17/19 0721  04/17/19 2119 04/18/19 0201 04/19/19 1720  NA 130*   < > 131* 133* 136  K 3.9  --   --  3.9 3.5  CL 101  --   --  104 104  CO2 13*  --   --  14* 19*  GLUCOSE 76  --   --  66* 85  BUN 55*  --   --  66* 85*  CREATININE 2.49*  --   --  2.61* 2.67*  CALCIUM 7.6*  --   --  7.5* 7.6*  GFRNONAA 25*  --   --  24* 23*  GFRAA 29*  --   --  27* 27*  ANIONGAP 16*  --   --  15 13   < > = values in this interval not displayed.    Recent Labs  Lab 04/17/19 0721 04/18/19 0201 04/19/19 1720  PROT 5.2* 5.0* 5.3*  ALBUMIN 1.6* 1.4* 1.4*  AST 17 14* 14*  ALT 10 10 8   ALKPHOS 61 58 48    BILITOT 6.4* 8.0* 6.4*   6.3*   Hematology Recent Labs  Lab 04/17/19 1422 04/18/19 0201 04/19/19 1720  WBC 6.4 6.1 3.8*  RBC 2.91* 3.08*   3.08* 2.56*  HGB 8.8* 9.0* 7.6*  HCT 26.9* 28.1* 23.8*  MCV 92.4 91.2 93.0  MCH 30.2 29.2 29.7  MCHC 32.7 32.0 31.9  RDW 17.2* 17.2* 17.4*  PLT 7* 11*   10* 5*   6*   BNPNo results for input(s): BNP, PROBNP in the last 168 hours.  DDimer  Recent Labs  Lab 04/17/19 0721 04/18/19 0201 04/19/19 1720  DDIMER 15.01* 11.80* 16.27*     Radiology/Studies:  US Abdomen Complete  Result Date: 04/17/2019 CLINICAL DATA:  Acute renal liver failure.  IV drug user EXAM: ABDOMEN ULTRASOUND COMPLETE COMPARISON:  None. FINDINGS: Gallbladder: There is sludge in the gallbladder. The gallbladder wall thickness measures 3 mm which is borderline. No Murphy's sign. There is ascites in the, some of which abuts gallbladder. No gallstones noted. Common bile duct: Diameter: 5.6 mm Liver: No focal lesion identified. Within normal limits in parenchymal echogenicity. Portal vein is patent on color Doppler imaging with normal direction of blood flow towards the liver. IVC: No abnormality visualized. Pancreas: Visualized portion unremarkable. Spleen: The spleen measures 19.52 cm in length with a volume 1120 cc consistent with splenomegaly. Right Kidney: Length: 11.8 cm. Increased echogenicity. Mild pelviectasis. Left Kidney: Length: 11.9  cm. Increased echogenicity. Mild pelviectasis. Abdominal aorta: No aneurysm visualized. Other findings: Pleural effusions and ascites identified IMPRESSION: 1. There is sludge in the gallbladder. The gallbladder wall is borderline in thickness. No Murphy's sign or stones. There is ascites in the abdomen, some of which abuts the gallbladder. If there is concern for acute cholecystitis, recommend a HIDA scan. 2. Splenomegaly. 3. Increased echogenicity in both kidneys consistent with history of chronic renal disease. There is mild bilateral  pelviectasis without gross hydronephrosis. 4. Pleural effusions and ascites. Electronically Signed   By: Dorise Bullion III M.D   On: 04/17/2019 10:59   US Renal  Result Date: 04/18/2019 CLINICAL DATA:  Acute on chronic renal injury EXAM: RENAL / URINARY TRACT ULTRASOUND COMPLETE COMPARISON:  01/20/2019 FINDINGS: Right Kidney: Renal measurements: 11.8 x 4.7 x 5.8 cm = volume: 169 mL. Diffuse increased echogenicity is identified. Mild pelviectasis is noted although no definitive obstructive changes are seen. Mild ascites is seen Left Kidney: Renal measurements: 11.8 x 5.0 x 6.3 cm = volume: 196 mL. Diffuse increased echogenicity is noted. Mild pelviectasis is noted although no definitive obstructive changes noted. Bladder: Not well visualized IMPRESSION: Increased echogenicity bilaterally similar to that seen on the prior exam. Mild ascites is noted. Note is also made of splenomegaly and gallbladder sludge similar to that seen on recent ultrasound of the abdomen (04/17/2019). Electronically Signed   By: Inez Catalina M.D.   On: 04/18/2019 17:30   Dg Chest Port 1 View  Result Date: 04/09/2019 CLINICAL DATA:  Shortness of breath EXAM: PORTABLE CHEST 1 VIEW COMPARISON:  Feb 19, 2019 FINDINGS: The previously noted tunneled central venous catheter, tracheostomy tube, and enteric tube have all been removed. The heart size is enlarged. There are multifocal airspace opacities bilaterally. There is volume overload. There are small bilateral pleural effusions, right greater than left. There is no pneumothorax. There is no acute osseous abnormality. There appear to be some old right-sided rib fractures. IMPRESSION: 1. Multifocal airspace opacities bilaterally concerning for multifocal pneumonia (viral or bacterial). 2. Cardiomegaly with generalized volume overload. 3. Small bilateral pleural effusions are noted. Electronically Signed   By: Constance Holster M.D.   On: 04/08/2019 19:08   Vas [place] Lower Extremity Venous  (dvt)  Result Date: 04/17/2019  Lower Venous Study Indications: Swelling. Other Indications: Polysubstance abuse, renal failure, failure to thrive,. Limitations: Poor ultrasound/tissue interface and patient unable to tolerate compression maneuvers. Comparison Study: 02/05/2019 Performing Technologist: Maudry Mayhew MHA, RDMS, RVT, RDCS  Examination Guidelines: A complete evaluation includes B-mode imaging, spectral Doppler, color Doppler, and power Doppler as needed of all accessible portions of each vessel. Bilateral testing is considered an integral part of a complete examination. Limited examinations for reoccurring indications may be performed as noted.  +---------+---------------+---------+-----------+----------+--------------+  RIGHT     Compressibility Phasicity Spontaneity Properties Summary         +---------+---------------+---------+-----------+----------+--------------+  CFV                       No        Yes                    Pulsatile flow  +---------+---------------+---------+-----------+----------+--------------+  FV Prox                             Yes                                    +---------+---------------+---------+-----------+----------+--------------+  FV Mid                              Yes                                    +---------+---------------+---------+-----------+----------+--------------+  FV Distal                           Yes                                    +---------+---------------+---------+-----------+----------+--------------+  PFV                                 Yes                                    +---------+---------------+---------+-----------+----------+--------------+  POP                       No        Yes                    Pulsatile flow  +---------+---------------+---------+-----------+----------+--------------+  PTV                                 Yes                                     +---------+---------------+---------+-----------+----------+--------------+  PERO                                Yes                                    +---------+---------------+---------+-----------+----------+--------------+   +---------+---------------+---------+-----------+----------+--------------+  LEFT      Compressibility Phasicity Spontaneity Properties Summary         +---------+---------------+---------+-----------+----------+--------------+  CFV                       No        Yes                    Pulsatile flow  +---------+---------------+---------+-----------+----------+--------------+  FV Prox                             Yes                                    +---------+---------------+---------+-----------+----------+--------------+  FV Mid                              Yes                                    +---------+---------------+---------+-----------+----------+--------------+  FV Distal                           Yes                                    +---------+---------------+---------+-----------+----------+--------------+  PFV                                 Yes                                    +---------+---------------+---------+-----------+----------+--------------+  POP                       No        Yes                    Pulsatile flow  +---------+---------------+---------+-----------+----------+--------------+  PTV                                 Yes                                    +---------+---------------+---------+-----------+----------+--------------+  PERO                                Yes                                    +---------+---------------+---------+-----------+----------+--------------+  Summary: Right: No obvious evidence of acute thrombosis by color Doppler. A cystic structure is found in the popliteal fossa. Left: No obvious evidence of acute thrombosis by color Doppler. A cystic structure is found in the popliteal fossa.  Pulsatile venous flow is  suggestive of possible elevated right heart pressure. No significant change when compared to prior study. *See table(s) above for measurements and observations. Electronically signed by Harold Barban MD on 04/17/2019 at 63:34:36 PM.    Final    [place] Ekg Site Rite  Result Date: 04/19/2019 If Site Rite image not attached, placement could not be confirmed due to current cardiac rhythm.  [place] Ekg Site Rite  Result Date: 04/17/2019 If Site Rite image not attached, placement could not be confirmed due to current cardiac rhythm.   Assessment and Plan:   Principal Problem:   Sepsis due to methicillin resistant Staphylococcus aureus (MRSA) with disseminated intravascular coagulation ([place]) Active Problems:   Septic pulmonary embolism (HCC)   MRSA bacteremia   Polysubstance abuse (HCC)   Endocarditis of tricuspid valve   Thrombocytopenia (HCC)   Sepsis due to Serratia ([place])   AKI (acute kidney injury) ([place])  Acute systolic heart failure in the setting of tricuspid valve endocarditis and severe tricuspid valve regurgitation- newly recognized reduced ejection fraction is likely secondary to critical illness.  Cannot exclude other causes at this time, however an ischemic evaluation is contraindicated at this time due to overall medical illness.  She will require a repeat echocardiogram if she improves, which can be performed in 4 to  6 weeks.  Challenging situation with volume management as she is actively septic with anasarca and third spacing.  She certainly has evidence of increased volume on exam, however she also likely has a very tight fluid window given her sepsis medical illness.  Can continue cautious diuresis in the setting of RV pressure and volume overload, however please plan to reassess this twice a day in the setting of sepsis.  She is critically ill and heart failure therapy is not able to be initiated at this time.  If she improves over the hospital stay would consider initiation of therapy for  systolic heart failure, however that will have to be a daily reassessment.  Tricuspid valve endocarditis- the patient has clear evidence of endocarditis on transthoracic echocardiogram.  There is no indication for transesophageal echocardiogram as no additional information is likely to be gleaned.  In addition platelet count is 5000 today which is a contraindication to performing TEE.  Please cancel planned TEE.  Antibiotics per infectious diseases, repeat transthoracic echocardiogram can be performed in 6 weeks, with plans for further follow-up or imaging to be determined at that time.  Very complex and challenging situation given continued IV drug abuse, repeated tricuspid valve endocarditis with evidence of valve destruction, and patient noncompliance.  Cardiology will follow.       For questions or updates, please contact [facility] Please consult [email or web address] for contact info under     Signed, Elouise Munroe, MD  04/19/2019 6:55 PM

## 2019-04-19 NOTE — Progress Notes (Addendum)
PROGRESS NOTE    Molly [name]  NFA:[id number] DOB: 26-Jul-1986 DOA: 04/11/2019 PCP: Patient, No Pcp Per   Brief Narrative: Patient is a 33 year old female with history of IV drug abuse, MRSA tricuspid valve endocarditis, history of epidural abscess, chronic renal insufficiency, seizures, noncompliance who presents to the emergency department for the evaluation of progressive leg swelling, shortness of breath and generalized body aches and pains.  On presentation, she was found to be afebrile, tachycardic, tachypneic and hypotensive.  Chest x-ray was concerning for multifocal airspace opacities bilaterally suggesting multifocal pneumonia as well as cardiomegaly and volume overload.  She was found to have hyponatremia, hypokalemia, acute kidney injury.  New thrombocytopenia with platelets of 8000, anemia, elevated lactic acid and elevated INR. Blood cultures have been sent which has shown MRSA and Serratia.  She has been started on broad-spectrum antibiotics.  ID already consulted. She has picture of DIC most likely associated with sepsis.  ID,PCCM and hematology following.  7/1: Started on lasix for severe peripheral edema 7/2: Echocardiogram shows ejection fraction of 25 to 30%.  Cardiology consulted.  Antibiotics changed to Teflaro.  Undergoing PICC line placement.  Labs still pending because lab technician were unable to get blood.  Assessment & Plan:   Principal Problem:   Sepsis due to methicillin resistant Staphylococcus aureus (MRSA) with disseminated intravascular coagulation ([place]) Active Problems:   Septic pulmonary embolism (HCC)   MRSA bacteremia   Polysubstance abuse ([place])   Endocarditis of tricuspid valve   Thrombocytopenia (HCC)   Sepsis due to Serratia ([place])   AKI (acute kidney injury) ([place])   DIC: Has severe thrombocytopenia (new), elevated Ddimer,elevated INR, low fibrinogen, elevated APTT.  Given vitamin K.  S/P transfusion with 1 unit of platelets.  Hematology  following.  Her DIC is associated with sepsis.  Monitor d-dimer, fibrillation, INR Today's labs are pending.  Thrombocytopenia: Continue platelet transfusion if platelets less than 10,000.labx spending.  Bacteremia/UTI: Blood cultures showed MRSA and Serratia.  Urine culture showing E. Coli. Started on Teflaro.  Follow-up final blood culture report.  ID following.   Echocardiogram showed ejection fraction of 25 to 30%, impaired left ventricular relaxation, mobile vegetation on the tricuspid valve, severe tricuspid regurgitation.  For TEE.  Cardiology consulted.  Her ejection fraction was normal as per echo in April 2020.  Severe sepsis: Presented with hypotension, elevated lactate level, leukocytosis.  Continue  IV antibiotics.  Follow-up cultures  Multifocal pneumonia:  Continue current antibiotics.  Monitor respiratory status closely.  COVID-19 screening is negative.  Continue supplemental oxygen as needed.  Acute systolic congestive heart failure: Ejection fraction of 25 to 30% as per echocardiogram.  Start on Lasix 40 mg twice daily.  Recent history of epidural abscess/discitis/back pain: Recent MRI negative for acute findings.  AKI on CKD stage II:Has severe bilateral lower extremity edema.  Bicarb was 14..  Started on Lasix and sodium bicarb tablets.  Ultrasound of the kidneys showed increased bilateral echogenicity.  Foley placed.  Hyperbilirubinemia: Most likely secondary to hemolysis.  Normal transaminases and alkaline phosphatase.  Hypokalemia/hypomagnesemia: Supplemented.  Anasarca: Multifactorial.   Secondary to severe congestive heart failure and hypoalbuminemia.  She is on Lasix at home.Bilateral lower extremity Doppler negative for DVT.  IV drug abuse: Injects fentanyl/heroin.  Last intake a day before admission.  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   Pressure ulcers: Stage I-II sacral ulcer.  Wound care  Following           DVT prophylaxis:SCD Code Status: Full Family Communication: Called mother ,call not received Disposition Plan: Undetermined at this point.  Patient is critically ill.  Low threshold to move to intensive care unit.   Consultants: PCCM, hematology, ID  Procedures: None  Antimicrobials:  Anti-infectives (From admission, onward)   Start     Dose/Rate Route Frequency Ordered Stop   04/19/19 1200  ceftaroline (TEFLARO) 300 mg in sodium chloride 0.9 % 250 mL IVPB     300 mg 250 mL/hr over 60 Minutes Intravenous Every 8 hours 04/19/19 0850     04/17/19 1515  doxycycline (VIBRA-TABS) tablet 100 mg  Status:  Discontinued     100 mg Oral Every 12 hours 04/17/19 1506 04/19/19 0850   04/17/19 1200  DAPTOmycin (CUBICIN) 500 mg in sodium chloride 0.9 % IVPB  Status:  Discontinued     500 mg 220 mL/hr over 30 Minutes Intravenous Every 48 hours 04/17/19 0847 04/19/19 0850   04/13/2019 2115  ceFEPIme (MAXIPIME) 1 g in sodium chloride 0.9 % 100 mL IVPB  Status:  Discontinued     1 g 200 mL/hr over 30 Minutes Intravenous Every 24 hours 04/10/2019 2100 04/19/19 0850   04/11/2019 2115  linezolid (ZYVOX) IVPB 600 mg     600 mg 300 mL/hr over 60 Minutes Intravenous Once 04/09/2019 2100 04/06/2019 2344      Subjective: Patient seen and examined at bedside this morning.  Awake, lying on the bed.  Icteric.  Very lethargic. complains of severe back pain.  Unable to get blood work today.  Undergoing PICC line placement  Objective: Vitals:   04/18/19 1752 04/18/19 2032 04/19/19 0530 04/19/19 0535  BP: 126/84 (!) 129/97 110/87   Pulse: (!) 106 (!) 111 (!) 115   Resp:  (!) 35 (!) 32   Temp: 97.7 F (36.5 C) 98.3 F (36.8 C) 98.9 F (37.2 C)   TempSrc:  Oral Oral   SpO2: 95% 95% 97%   Weight:    53.6 kg  Height:        Intake/Output Summary (Last 24 hours) at 04/19/2019 1521 Last data filed at 04/19/2019 1500 Gross per 24 hour   Intake 60 ml  Output 2275 ml  Net -2215 ml   Filed Weights   04/17/19 0032 04/18/19 0415 04/19/19 0535  Weight: 51.7 kg 53 kg 53.6 kg    Examination:  General exam: Weak, chronically ill looking, icteric  Respiratory system: Bilateral decreased air entry on the bases Cardiovascular system: S1 & S2 heard, RRR.  Anasarca gastrointestinal system: Abdomen is mildly distended, soft and nontender. No organomegaly or masses felt. Normal bowel sounds heard. Central nervous system: Alert and oriented.  Generalized weakness extremities: Severe bilateral lower extremity edema skin: Icterus, pressure ulcers on the sacral area   Data Reviewed: I have personally reviewed following labs and imaging studies  CBC: Recent Labs  Lab 04/08/2019 2039 04/07/2019 2053 04/17/19 0721 04/17/19 1422 04/18/19 0201  WBC  --  4.8 5.8 6.4 6.1  NEUTROABS  --  3.9 5.0 5.5 5.0  HGB 9.2*  9.2* 8.6* 9.3* 8.8* 9.0*  HCT 27.0*  27.0* 27.2* 29.7* 26.9* 28.1*  MCV  --  93.5 94.0 92.4 91.2  PLT  --  8* 9*  9* 7* 11*  10*   Basic Metabolic Panel: Recent Labs  Lab 03/21/2019 2039 03/22/2019 2053 04/17/19 0721 04/17/19 1422 04/17/19 2119 04/18/19 0201  NA 125*  125* 127* 130*  130* 131* 133*  K 6.4*  6.4* 2.8* 3.9  --   --  3.9  CL 101  101 97* 101  --   --  104  CO2  --  16* 13*  --   --  14*  GLUCOSE 73  73 79 76  --   --  66*  BUN 71*  71* 53* 55*  --   --  66*  CREATININE 2.20*  2.20* 2.39* 2.49*  --   --  2.61*  CALCIUM  --  7.3* 7.6*  --   --  7.5*  MG  --   --  1.4*  --   --   --    GFR: Estimated Creatinine Clearance: 23.6 mL/min (A) (by C-G formula based on SCr of 2.61 mg/dL (H)). Liver Function Tests: Recent Labs  Lab 04/17/2019 2053 04/17/19 0721 04/18/19 0201  AST 21 17 14*  ALT 8 10 10   ALKPHOS 80 61 58  BILITOT 5.4* 6.4* 8.0*  PROT 5.2* 5.2* 5.0*  ALBUMIN 1.6* 1.6* 1.4*   No results for input(s): LIPASE, AMYLASE in the last 168 hours. No results for input(s): AMMONIA in the  last 168 hours. Coagulation Profile: Recent Labs  Lab 04/10/2019 2053 04/17/19 0721 04/18/19 0201  INR 1.8* 2.2* 2.2*  2.1*   Cardiac Enzymes: Recent Labs  Lab 04/18/19 0201  CKTOTAL 21*   BNP (last 3 results) No results for input(s): PROBNP in the last 8760 hours. HbA1C: No results for input(s): HGBA1C in the last 72 hours. CBG: Recent Labs  Lab 04/17/19 0753 04/18/19 0751  GLUCAP 73 71   Lipid Profile: No results for input(s): CHOL, HDL, LDLCALC, TRIG, CHOLHDL, LDLDIRECT in the last 72 hours. Thyroid Function Tests: No results for input(s): TSH, T4TOTAL, FREET4, T3FREE, THYROIDAB in the last 72 hours. Anemia Panel: Recent Labs    04/18/19 0201  RETICCTPCT 0.5   Sepsis Labs: Recent Labs  Lab 04/02/2019 2050 04/17/19 0721 04/17/19 0742 04/17/19 1440 04/18/19 0201  PROCALCITON  --  46.86  --   --  47.37  LATICACIDVEN 2.5*  --  5.5* 3.9* 3.4*    Recent Results (from the past 240 hour(s))  Blood Culture (routine x 2)     Status: Abnormal   Collection Time: 04/11/2019  6:54 PM   Specimen: BLOOD RIGHT ARM  Result Value Ref Range Status   Specimen Description BLOOD RIGHT ARM  Final   Special Requests   Final    BOTTLES DRAWN AEROBIC AND ANAEROBIC Blood Culture results may not be optimal due to an inadequate volume of blood received in culture bottles   Culture  Setup Time   Final    GRAM POSITIVE COCCI IN CLUSTERS GRAM NEGATIVE RODS IN BOTH AEROBIC AND ANAEROBIC BOTTLES CRITICAL RESULT CALLED TO, READ BACK BY AND VERIFIED WITH: Dala Dock SINCLAIR [id number] 0830 MLM Performed at [facility], [place] [address] [place], [place] [address]    Culture (A)  Final    METHICILLIN RESISTANT STAPHYLOCOCCUS AUREUS SERRATIA MARCESCENS    Report Status 04/19/2019 FINAL  Final   Organism ID, Bacteria METHICILLIN RESISTANT STAPHYLOCOCCUS AUREUS  Final   Organism ID, Bacteria SERRATIA MARCESCENS  Final      Susceptibility   Methicillin resistant staphylococcus aureus -  MIC*    CIPROFLOXACIN >=8 RESISTANT Resistant     ERYTHROMYCIN >=8 RESISTANT Resistant     GENTAMICIN <=0.5 SENSITIVE Sensitive     OXACILLIN >=4 RESISTANT Resistant     TETRACYCLINE <=  1 SENSITIVE Sensitive     VANCOMYCIN 1 SENSITIVE Sensitive     TRIMETH/SULFA <=10 SENSITIVE Sensitive     CLINDAMYCIN >=8 RESISTANT Resistant     RIFAMPIN <=0.5 SENSITIVE Sensitive     Inducible Clindamycin NEGATIVE Sensitive     * METHICILLIN RESISTANT STAPHYLOCOCCUS AUREUS   Serratia marcescens - MIC*    CEFAZOLIN >=64 RESISTANT Resistant     CEFEPIME <=1 SENSITIVE Sensitive     CEFTAZIDIME <=1 SENSITIVE Sensitive     CEFTRIAXONE <=1 SENSITIVE Sensitive     CIPROFLOXACIN <=0.25 SENSITIVE Sensitive     GENTAMICIN <=1 SENSITIVE Sensitive     TRIMETH/SULFA <=20 SENSITIVE Sensitive     * SERRATIA MARCESCENS  Blood Culture ID Panel (Reflexed)     Status: Abnormal   Collection Time: 03/28/2019  6:54 PM  Result Value Ref Range Status   Enterococcus species NOT DETECTED NOT DETECTED Final   Listeria monocytogenes NOT DETECTED NOT DETECTED Final   Staphylococcus species DETECTED (A) NOT DETECTED Final    Comment: PHARMD E SINCLAIR [id number] 0830 MLM   Staphylococcus aureus (BCID) DETECTED (A) NOT DETECTED Final    Comment: Methicillin (oxacillin)-resistant Staphylococcus aureus (MRSA). MRSA is predictably resistant to beta-lactam antibiotics (except ceftaroline). Preferred therapy is vancomycin unless clinically contraindicated. Patient requires contact precautions if  hospitalized. PHARMD E SINCLAIR [id number] 0830 MLM    Methicillin resistance DETECTED (A) NOT DETECTED Final    Comment: CRITICAL RESULT CALLED TO, READ BACK BY AND VERIFIED WITH: PHARMD E SINCLAIR [id number] 0830 MLM    Streptococcus species NOT DETECTED NOT DETECTED Final   Streptococcus agalactiae NOT DETECTED NOT DETECTED Final   Streptococcus pneumoniae NOT DETECTED NOT DETECTED Final   Streptococcus pyogenes NOT DETECTED NOT DETECTED Final    Acinetobacter baumannii NOT DETECTED NOT DETECTED Final   Enterobacteriaceae species DETECTED (A) NOT DETECTED Final    Comment: Enterobacteriaceae represent a large family of gram-negative bacteria, not a single organism. PHARMD E SINCLAIR [id number] 0830 MLM    Enterobacter cloacae complex NOT DETECTED NOT DETECTED Final   Escherichia coli NOT DETECTED NOT DETECTED Final   Klebsiella oxytoca NOT DETECTED NOT DETECTED Final   Klebsiella pneumoniae NOT DETECTED NOT DETECTED Final   Proteus species NOT DETECTED NOT DETECTED Final   Serratia marcescens DETECTED (A) NOT DETECTED Final    Comment: PHARMD E SINCLAIR [id number] 0830 MLM   Carbapenem resistance NOT DETECTED NOT DETECTED Final   Haemophilus influenzae NOT DETECTED NOT DETECTED Final   Neisseria meningitidis NOT DETECTED NOT DETECTED Final   Pseudomonas aeruginosa NOT DETECTED NOT DETECTED Final   Candida albicans NOT DETECTED NOT DETECTED Final   Candida glabrata NOT DETECTED NOT DETECTED Final   Candida krusei NOT DETECTED NOT DETECTED Final   Candida parapsilosis NOT DETECTED NOT DETECTED Final   Candida tropicalis NOT DETECTED NOT DETECTED Final    Comment: Performed at [facility], [place] [address], [place], [place] [address]  SARS Coronavirus 2 (CEPHEID - Performed in [place] hospital lab), Hosp Order     Status: None   Collection Time: 04/06/2019  7:18 PM   Specimen: Nasopharyngeal Swab  Result Value Ref Range Status   SARS Coronavirus 2 NEGATIVE NEGATIVE Final    Comment: (NOTE) If result is NEGATIVE SARS-CoV-2 target nucleic acids are NOT DETECTED. The SARS-CoV-2 RNA is generally detectable in upper and lower  respiratory specimens during the acute phase of infection. The lowest  concentration of SARS-CoV-2 viral copies this assay can detect is 250  copies / mL. A negative result does not preclude SARS-CoV-2 infection  and should not be used as the sole basis for treatment or other  patient management decisions.  A  negative result may occur with  improper specimen collection / handling, submission of specimen other  than nasopharyngeal swab, presence of viral mutation(s) within the  areas targeted by this assay, and inadequate number of viral copies  (<250 copies / mL). A negative result must be combined with clinical  observations, patient history, and epidemiological information. If result is POSITIVE SARS-CoV-2 target nucleic acids are DETECTED. The SARS-CoV-2 RNA is generally detectable in upper and lower  respiratory specimens dur ing the acute phase of infection.  Positive  results are indicative of active infection with SARS-CoV-2.  Clinical  correlation with patient history and other diagnostic information is  necessary to determine patient infection status.  Positive results do  not rule out bacterial infection or co-infection with other viruses. If result is PRESUMPTIVE POSTIVE SARS-CoV-2 nucleic acids MAY BE PRESENT.   A presumptive positive result was obtained on the submitted specimen  and confirmed on repeat testing.  While 2019 novel coronavirus  (SARS-CoV-2) nucleic acids may be present in the submitted sample  additional confirmatory testing may be necessary for epidemiological  and / or clinical management purposes  to differentiate between  SARS-CoV-2 and other Sarbecovirus currently known to infect humans.  If clinically indicated additional testing with an alternate test  methodology [phone]) is advised. The SARS-CoV-2 RNA is generally  detectable in upper and lower respiratory sp ecimens during the acute  phase of infection. The expected result is Negative. Fact Sheet for Patients:  StrictlyIdeas.no Fact Sheet for Healthcare Providers: BankingDealers.co.za This test is not yet approved or cleared by the [place] FDA and has been authorized for detection and/or diagnosis of SARS-CoV-2 by FDA under an Emergency Use  Authorization (EUA).  This EUA will remain in effect (meaning this test can be used) for the duration of the COVID-19 declaration under Section 564(b)(1) of the Act, 21 U.S.C. section 360bbb-3(b)(1), unless the authorization is terminated or revoked sooner. Performed at [facility], [place] [address] [place], [place] [address]   Urine culture     Status: Abnormal (Preliminary result)   Collection Time: 04/13/2019  7:55 PM   Specimen: Urine, Random  Result Value Ref Range Status   Specimen Description URINE, RANDOM  Final   Special Requests   Final    NONE Performed at [facility], [place] [address], [place], [place] [address]    Culture >=100,000 COLONIES/mL ESCHERICHIA COLI (A)  Final   Report Status PENDING  Incomplete  Blood Culture (routine x 2)     Status: Abnormal   Collection Time: 04/09/2019  8:53 PM   Specimen: BLOOD RIGHT FOREARM  Result Value Ref Range Status   Specimen Description BLOOD RIGHT FOREARM  Final   Special Requests   Final    BOTTLES DRAWN AEROBIC ONLY Blood Culture results may not be optimal due to an inadequate volume of blood received in culture bottles   Culture  Setup Time   Final    GRAM POSITIVE COCCI GRAM NEGATIVE RODS AEROBIC BOTTLE ONLY    Culture (A)  Final    STAPHYLOCOCCUS AUREUS SERRATIA MARCESCENS SUSCEPTIBILITIES PERFORMED ON PREVIOUS CULTURE WITHIN THE LAST 5 DAYS. Performed at [facility], [place] [address] [place],  [address]    Report Status 04/19/2019 FINAL  Final  MRSA PCR Screening  Status: Abnormal   Collection Time: 04/17/19  1:10 AM   Specimen: Nasal Mucosa; Nasopharyngeal  Result Value Ref Range Status   MRSA by PCR POSITIVE (A) NEGATIVE Final    Comment:        The GeneXpert MRSA Assay (FDA approved for NASAL specimens only), is one component of a comprehensive MRSA colonization surveillance program. It is not intended to diagnose MRSA infection nor to guide or monitor treatment for MRSA  infections. RESULT CALLED TO, READ BACK BY AND VERIFIED WITH: BUENDIA,A RN [phone] 04/17/2019 MITCHELL,L Performed at [facility], [place] [address], [place], [place] [address]   Culture, blood (routine x 2)     Status: None (Preliminary result)   Collection Time: 04/18/19 10:50 AM   Specimen: BLOOD RIGHT HAND  Result Value Ref Range Status   Specimen Description BLOOD RIGHT HAND  Final   Special Requests   Final    BOTTLES DRAWN AEROBIC ONLY Blood Culture adequate volume   Culture   Final    NO GROWTH < 24 HOURS Performed at [facility], [place] [address] [place], [place] [address]    Report Status PENDING  Incomplete  Culture, blood (routine x 2)     Status: None (Preliminary result)   Collection Time: 04/18/19 11:05 AM   Specimen: BLOOD RIGHT HAND  Result Value Ref Range Status   Specimen Description BLOOD RIGHT HAND  Final   Special Requests   Final    BOTTLES DRAWN AEROBIC ONLY Blood Culture results may not be optimal due to an inadequate volume of blood received in culture bottles   Culture   Final    NO GROWTH < 24 HOURS Performed at [facility], [place] [address], [place], [place] [address]    Report Status PENDING  Incomplete         Radiology Studies: US Renal  Result Date: 04/18/2019 CLINICAL DATA:  Acute on chronic renal injury EXAM: RENAL / URINARY TRACT ULTRASOUND COMPLETE COMPARISON:  01/20/2019 FINDINGS: Right Kidney: Renal measurements: 11.8 x 4.7 x 5.8 cm = volume: 169 mL. Diffuse increased echogenicity is identified. Mild pelviectasis is noted although no definitive obstructive changes are seen. Mild ascites is seen Left Kidney: Renal measurements: 11.8 x 5.0 x 6.3 cm = volume: 196 mL. Diffuse increased echogenicity is noted. Mild pelviectasis is noted although no definitive obstructive changes noted. Bladder: Not well visualized IMPRESSION: Increased echogenicity bilaterally similar to that seen on the prior exam. Mild ascites is noted. Note is also  made of splenomegaly and gallbladder sludge similar to that seen on recent ultrasound of the abdomen (04/17/2019). Electronically Signed   By: Inez Catalina M.D.   On: 04/18/2019 17:30   [place] Ekg Site Rite  Result Date: 04/19/2019 If Site Rite image not attached, placement could not be confirmed due to current cardiac rhythm.  [place] Ekg Site Rite  Result Date: 04/17/2019 If Site Rite image not attached, placement could not be confirmed due to current cardiac rhythm.       Scheduled Meds: . Chlorhexidine Gluconate Cloth  6 each Topical Q0600  . furosemide  40 mg Intravenous Q12H  . mupirocin ointment  1 application Nasal BID  . sodium bicarbonate  650 mg Oral TID  . sodium chloride flush  3 mL Intravenous Q12H  . sodium chloride flush  3 mL Intravenous Q12H   Continuous Infusions: . sodium chloride 250 mL (04/19/19 1248)  . ceFTAROline (TEFLARO) IV 300 mg (04/19/19 1250)  LOS: 3 days    Time spent: 35 mins.More than 50% of that time was spent in counseling and/or coordination of care.      Shelly Coss, MD [facility] Pager [phone]  If 7PM-7AM, please contact night-coverage [email or web address] Password [email or web address] 04/19/2019, 3:21 PM

## 2019-04-19 NOTE — Progress Notes (Signed)
Molly [name] for Infectious Disease  Date of Admission:  04/06/2019   Total days of antibiotics 4        Day 1 of ceftaroline                Principal Problem:   Sepsis due to methicillin resistant Staphylococcus aureus (MRSA) with disseminated intravascular coagulation ([place]) Active Problems:   MRSA bacteremia   Endocarditis   Sepsis due to Serratia ([place])   Polysubstance abuse ([place])   Pulmonary infiltrate   Thrombocytopenia (HCC)   AKI (acute kidney injury) (HCC)    Chlorhexidine Gluconate Cloth  6 each Topical Q0600   furosemide  40 mg Intravenous Q12H   mupirocin ointment  1 application Nasal BID   sodium bicarbonate  650 mg Oral TID   sodium chloride flush  3 mL Intravenous Q12H   sodium chloride flush  3 mL Intravenous Q12H   ASSESSMENT: Molly [name] is a 33 yo F w/ PMH of IV drug use, MRSA tricuspid valve endocarditis, epidural abscess, CKD and seizures admit for MRSA and Serratia marcescens bacteremia on hospital day 3  PLAN: 1. MRSA/Serratia marcescens bacteremia w/ sepsis: Unable to get f/u labs this am due to poor stick. Scheduled for PICC line placement by IR per primary team. Blood culture showing serratia w/ sensitivity to most cephalosporins except first gen. Okay to transition to ceftaroline today. Repeat blood culture NGTD at 24 hrs - Start ceftaroline - D/c cefepime, daptomycin, doxycycline - F/u Blood cultures  2. Bacterial Endocarditis with Tricuspid vegetation: TTE 04/18/19 revealing EF of 25-30%, Large, mobile vegetation on tricuspid valve with severe tricuspid regurgitation. TEE ordered per primary. 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 Likely re-seeded by continued IV drug use. - F/u TEE - C/w abx therapy as above  3. DIC 2/2 sepsis: No morning labs due to poor stick. - Management per heme/onc   SUBJECTIVE: Molly [name] was examined and evaluated at bedside this AM. She was observed disoriented  and yelling out intermittently in pain. She complains primarily of back pain but unable to answer further questions or follow directions.   Review of Systems: Review of Systems  Reason unable to perform ROS: Unable to answer questions.    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)    Sepsis ([place]) 01/20/2019   Sepsis with disseminated intravascular coagulopathy (DIC) ([place]) 01/20/2019   Sepsis with multi-organ dysfunction ([place]) 04/01/2019   Sepsis with multiple organ dysfunction (MOD) ([place]) 04/08/2018   Septic pulmonary embolism (HCC)    Tobacco abuse     Social History   Tobacco Use   Smoking status: Current Every Day Smoker    Packs/day: 0.50    Years: 7.00    Pack years: 3.50    Types: Cigarettes   Smokeless tobacco: Never Used  Tobacco comment: slowing down  Substance Use Topics   Alcohol use: No   Drug use: Yes    Types: IV, Oxycodone    Comment: heroin    Family History  Problem Relation Age of Onset   Alcoholism Father    Allergies  Allergen Reactions   Lactose Intolerance ([place])    Vancomycin Other (See Comments)    Possible contributor to AKI and thrombocytopenia    OBJECTIVE: Vitals:   04/18/19 1752 04/18/19 2032 04/19/19 0530 04/19/19 0535  BP: 126/84 (!) 129/97 110/87   Pulse: (!) 106 (!) 111 (!) 115   Resp:  (!) 35 (!) 32   Temp: 97.7 F (36.5 C) 98.3 F (36.8 C) 98.9 F (37.2 C)     TempSrc:  Oral Oral   SpO2: 95% 95% 97%   Weight:    53.6 kg  Height:       Body mass index is 22.33 kg/m.  Physical Exam Constitutional:      General: She is in acute distress.     Appearance: She is toxic-appearing.  Cardiovascular:     Rate and Rhythm: Regular rhythm. Tachycardia present.     Pulses: Normal pulses.  Pulmonary:     Effort: Respiratory distress (Tachypneic) present.     Breath sounds: Rales present.  Abdominal:     General: Abdomen is flat. Bowel sounds are normal.     Tenderness: There is no abdominal tenderness.  Musculoskeletal: Normal range of motion.     Right lower leg: Edema (2+ up to knees) present.     Left lower leg: Edema (2+ up to knees) present.  Skin:    General: Skin is warm.     Coloration: Skin is jaundiced.  Neurological:     Mental Status: She is disoriented.     Lab Results Lab Results  Component Value Date   WBC 6.1 04/18/2019   HGB 9.0 (L) 04/18/2019   HCT 28.1 (L) 04/18/2019   MCV 91.2 04/18/2019   PLT 10 (LL) 04/18/2019   PLT 11 (LL) 04/18/2019    Lab Results  Component Value Date   CREATININE 2.61 (H) 04/18/2019   BUN 66 (H) 04/18/2019   NA 133 (L) 04/18/2019   K 3.9 04/18/2019   CL 104 04/18/2019   CO2 14 (L) 04/18/2019    Lab Results  Component Value Date   ALT 10 04/18/2019   AST 14 (L) 04/18/2019   ALKPHOS 58 04/18/2019   BILITOT 8.0 (H) 04/18/2019     Microbiology: Recent Results (from the past 240 hour(s))  Blood Culture (routine x 2)     Status: Abnormal   Collection Time: 04/14/2019  6:54 PM   Specimen: BLOOD RIGHT ARM  Result Value Ref Range Status   Specimen Description BLOOD RIGHT ARM  Final   Special Requests   Final    BOTTLES DRAWN AEROBIC AND ANAEROBIC Blood Culture results may not be optimal due to an inadequate volume of blood received in culture bottles   Culture  Setup Time   Final    GRAM POSITIVE COCCI IN CLUSTERS GRAM NEGATIVE RODS IN BOTH AEROBIC AND ANAEROBIC BOTTLES CRITICAL  RESULT CALLED TO, READ BACK BY AND VERIFIED WITH: Dala Dock SINCLAIR [id number] MLM Performed at [facility], [place] [address], [place], [place] [address]    Culture (A)  Final    METHICILLIN RESISTANT STAPHYLOCOCCUS AUREUS SERRATIA MARCESCENS    Report Status 04/19/2019 FINAL  Final   Organism ID, Bacteria METHICILLIN  RESISTANT STAPHYLOCOCCUS AUREUS  Final   Organism ID, Bacteria SERRATIA MARCESCENS  Final      Susceptibility   Methicillin resistant staphylococcus aureus - MIC*    CIPROFLOXACIN >=8 RESISTANT Resistant     ERYTHROMYCIN >=8 RESISTANT Resistant     GENTAMICIN <=0.5 SENSITIVE Sensitive     OXACILLIN >=4 RESISTANT Resistant     TETRACYCLINE <=1 SENSITIVE Sensitive     VANCOMYCIN 1 SENSITIVE Sensitive     TRIMETH/SULFA <=10 SENSITIVE Sensitive     CLINDAMYCIN >=8 RESISTANT Resistant     RIFAMPIN <=0.5 SENSITIVE Sensitive     Inducible Clindamycin NEGATIVE Sensitive     * METHICILLIN RESISTANT STAPHYLOCOCCUS AUREUS   Serratia marcescens - MIC*    CEFAZOLIN >=64 RESISTANT Resistant     CEFEPIME <=1 SENSITIVE Sensitive     CEFTAZIDIME <=1 SENSITIVE Sensitive     CEFTRIAXONE <=1 SENSITIVE Sensitive     CIPROFLOXACIN <=0.25 SENSITIVE Sensitive     GENTAMICIN <=1 SENSITIVE Sensitive     TRIMETH/SULFA <=20 SENSITIVE Sensitive     * SERRATIA MARCESCENS  Blood Culture ID Panel (Reflexed)     Status: Abnormal   Collection Time: 03/26/2019  6:54 PM  Result Value Ref Range Status   Enterococcus species NOT DETECTED NOT DETECTED Final   Listeria monocytogenes NOT DETECTED NOT DETECTED Final   Staphylococcus species DETECTED (A) NOT DETECTED Final    Comment: PHARMD E SINCLAIR [id number] MLM   Staphylococcus aureus (BCID) DETECTED (A) NOT DETECTED Final    Comment: Methicillin (oxacillin)-resistant Staphylococcus aureus (MRSA). MRSA is predictably resistant to beta-lactam antibiotics (except ceftaroline). Preferred therapy is vancomycin unless clinically contraindicated.  Patient requires contact precautions if  hospitalized. PHARMD E SINCLAIR [id number] 0830 MLM    Methicillin resistance DETECTED (A) NOT DETECTED Final    Comment: CRITICAL RESULT CALLED TO, READ BACK BY AND VERIFIED WITH: PHARMD E SINCLAIR [id number] 0830 MLM    Streptococcus species NOT DETECTED NOT DETECTED Final   Streptococcus agalactiae NOT DETECTED NOT DETECTED Final   Streptococcus pneumoniae NOT DETECTED NOT DETECTED Final   Streptococcus pyogenes NOT DETECTED NOT DETECTED Final   Acinetobacter baumannii NOT DETECTED NOT DETECTED Final   Enterobacteriaceae species DETECTED (A) NOT DETECTED Final    Comment: Enterobacteriaceae represent a large family of gram-negative bacteria, not a single organism. PHARMD E SINCLAIR [id number] 0830 MLM    Enterobacter cloacae complex NOT DETECTED NOT DETECTED Final   Escherichia coli NOT DETECTED NOT DETECTED Final   Klebsiella oxytoca NOT DETECTED NOT DETECTED Final   Klebsiella pneumoniae NOT DETECTED NOT DETECTED Final   Proteus species NOT DETECTED NOT DETECTED Final   Serratia marcescens DETECTED (A) NOT DETECTED Final    Comment: PHARMD E SINCLAIR [id number] 0830 MLM   Carbapenem resistance NOT DETECTED NOT DETECTED Final   Haemophilus influenzae NOT DETECTED NOT DETECTED Final   Neisseria meningitidis NOT DETECTED NOT DETECTED Final   Pseudomonas aeruginosa NOT DETECTED NOT DETECTED Final   Candida albicans NOT DETECTED NOT DETECTED Final   Candida glabrata NOT DETECTED NOT DETECTED Final   Candida krusei NOT DETECTED NOT DETECTED Final   Candida parapsilosis NOT DETECTED NOT DETECTED Final   Candida tropicalis NOT DETECTED NOT DETECTED Final    Comment: Performed at [facility], [place] [address], [place], [place] [address]  SARS Coronavirus 2 (CEPHEID - Performed in [facility] hospital lab), Hosp Order     Status: None   Collection Time: 03/31/2019  7:18 PM   Specimen: Nasopharyngeal Swab  Result Value Ref Range Status   SARS Coronavirus 2  NEGATIVE NEGATIVE Final    Comment: (NOTE) If result is NEGATIVE SARS-CoV-2 target nucleic acids are NOT DETECTED. The SARS-CoV-2 RNA is generally detectable in upper and lower  respiratory specimens during the acute phase of infection. The lowest  concentration of SARS-CoV-2 viral copies this assay can detect is 250  copies / mL. A negative result does not preclude SARS-CoV-2 infection  and should not be used as the sole basis for treatment or other  patient management decisions.  A negative result may occur with  improper specimen collection / handling, submission of specimen other  than nasopharyngeal swab, presence of viral mutation(s) within the  areas targeted by this assay, and inadequate number of viral copies  (<250 copies / mL). A negative result must be combined with clinical  observations, patient history, and epidemiological information. If result is POSITIVE SARS-CoV-2 target nucleic acids are DETECTED. The SARS-CoV-2 RNA is generally detectable in upper and lower  respiratory specimens dur ing the acute phase of infection.  Positive  results are indicative of active infection with SARS-CoV-2.  Clinical  correlation with patient history and other diagnostic information is  necessary to determine patient infection status.  Positive results do  not rule out bacterial infection or co-infection with other viruses. If result is PRESUMPTIVE POSTIVE SARS-CoV-2 nucleic acids MAY BE PRESENT.   A presumptive positive result was obtained on the submitted specimen  and confirmed on repeat testing.  While 2019 novel coronavirus  (SARS-CoV-2) nucleic acids may be present in the submitted sample  additional confirmatory testing may be necessary for epidemiological  and / or clinical management purposes  to differentiate between  SARS-CoV-2 and other Sarbecovirus currently known to infect humans.  If clinically indicated additional testing with an alternate test  methodology [phone])  is advised. The SARS-CoV-2 RNA is generally  detectable in upper and lower respiratory sp ecimens during the acute  phase of infection. The expected result is Negative. Fact Sheet for Patients:  StrictlyIdeas.no Fact Sheet for Healthcare Providers: BankingDealers.co.za This test is not yet approved or cleared by the [place] FDA and has been authorized for detection and/or diagnosis of SARS-CoV-2 by FDA under an Emergency Use Authorization (EUA).  This EUA will remain in effect (meaning this test can be used) for the duration of the COVID-19 declaration under Section 564(b)(1) of the Act, 21 U.S.C. section 360bbb-3(b)(1), unless the authorization is terminated or revoked sooner. Performed at [facility], [place] [address] [place], [place] [address]   Urine culture     Status: Abnormal (Preliminary result)   Collection Time: 03/20/2019  7:55 PM   Specimen: Urine, Random  Result Value Ref Range Status   Specimen Description URINE, RANDOM  Final   Special Requests   Final    NONE Performed at [facility], [place] [address] [place], [place] [address]    Culture >=100,000 COLONIES/mL GRAM NEGATIVE RODS (A)  Final   Report Status PENDING  Incomplete  Blood Culture (routine x 2)     Status: Abnormal   Collection Time: 03/30/2019  8:53 PM   Specimen: BLOOD RIGHT FOREARM  Result Value Ref Range Status   Specimen Description BLOOD RIGHT FOREARM  Final   Special Requests   Final    BOTTLES DRAWN AEROBIC ONLY Blood Culture results may not be optimal due to an inadequate volume of blood received in culture bottles   Culture  Setup Time  Final    GRAM POSITIVE COCCI GRAM NEGATIVE RODS AEROBIC BOTTLE ONLY    Culture (A)  Final    STAPHYLOCOCCUS AUREUS SERRATIA MARCESCENS SUSCEPTIBILITIES PERFORMED ON PREVIOUS CULTURE WITHIN THE LAST 5 DAYS. Performed at [facility], [place] [address], [place], [place] [address]    Report  Status 04/19/2019 FINAL  Final  MRSA PCR Screening     Status: Abnormal   Collection Time: 04/17/19  1:10 AM   Specimen: Nasal Mucosa; Nasopharyngeal  Result Value Ref Range Status   MRSA by PCR POSITIVE (A) NEGATIVE Final    Comment:        The GeneXpert MRSA Assay (FDA approved for NASAL specimens only), is one component of a comprehensive MRSA colonization surveillance program. It is not intended to diagnose MRSA infection nor to guide or monitor treatment for MRSA infections. RESULT CALLED TO, READ BACK BY AND VERIFIED WITH: BUENDIA,A RN [phone] 04/17/2019 MITCHELL,L Performed at [facility], [place] [address] [place], [place] [address]   Culture, blood (routine x 2)     Status: None (Preliminary result)   Collection Time: 04/18/19 10:50 AM   Specimen: BLOOD RIGHT HAND  Result Value Ref Range Status   Specimen Description BLOOD RIGHT HAND  Final   Special Requests   Final    BOTTLES DRAWN AEROBIC ONLY Blood Culture adequate volume   Culture   Final    NO GROWTH < 24 HOURS Performed at [facility], [place] [address] [place], [place] [address]    Report Status PENDING  Incomplete  Culture, blood (routine x 2)     Status: None (Preliminary result)   Collection Time: 04/18/19 11:05 AM   Specimen: BLOOD RIGHT HAND  Result Value Ref Range Status   Specimen Description BLOOD RIGHT HAND  Final   Special Requests   Final    BOTTLES DRAWN AEROBIC ONLY Blood Culture results may not be optimal due to an inadequate volume of blood received in culture bottles   Culture   Final    NO GROWTH < 24 HOURS Performed at [facility], [place] [address], [place], [place] [address]    Report Status PENDING  [place], [place] [facility] [phone] pager   [phone] cell 04/19/2019, 10:24 AM

## 2019-04-20 DIAGNOSIS — J9601 Acute respiratory failure with hypoxia: Secondary | ICD-10-CM

## 2019-04-20 DIAGNOSIS — Z515 Encounter for palliative care: Secondary | ICD-10-CM

## 2019-04-20 LAB — GLUCOSE, CAPILLARY: Glucose-Capillary: 93 mg/dL (ref 70–99)

## 2019-04-20 MED ORDER — GLYCOPYRROLATE 0.2 MG/ML IJ SOLN: 0.2000 mg | INTRAMUSCULAR | Status: DC | PRN

## 2019-04-20 MED ORDER — "THROMBI-PAD 3""X3"" EX PADS"
1.0000 | MEDICATED_PAD | Freq: Once | CUTANEOUS | Status: AC
  Administered 2019-04-20: 1 via TOPICAL
  Filled 2019-04-20: qty 1

## 2019-04-20 MED ORDER — SODIUM CHLORIDE 0.9 % IV SOLN
300.0000 mg | Freq: Three times a day (TID) | INTRAVENOUS | Status: DC
  Filled 2019-04-20 (×3): qty 300

## 2019-04-20 MED ORDER — ACETAMINOPHEN 650 MG RE SUPP: 650.0000 mg | Freq: Four times a day (QID) | RECTAL | Status: DC | PRN

## 2019-04-20 MED ORDER — POLYVINYL ALCOHOL 1.4 % OP SOLN
1.0000 [drp] | Freq: Four times a day (QID) | OPHTHALMIC | Status: DC | PRN
  Filled 2019-04-20: qty 15

## 2019-04-20 MED ORDER — DEXTROSE 5 % IV SOLN
INTRAVENOUS | Status: DC
  Administered 2019-04-20: 14:00:00 via INTRAVENOUS

## 2019-04-20 MED ORDER — LORAZEPAM 2 MG/ML IJ SOLN: 1.0000 mg | INTRAMUSCULAR | Status: DC | PRN

## 2019-04-20 MED ORDER — DIPHENHYDRAMINE HCL 50 MG/ML IJ SOLN: 25.0000 mg | INTRAMUSCULAR | Status: DC | PRN

## 2019-04-20 MED ORDER — MORPHINE SULFATE (PF) 2 MG/ML IV SOLN: 2.0000 mg | INTRAVENOUS | Status: DC | PRN

## 2019-04-20 MED ORDER — MORPHINE 100MG IN NS 100ML (1MG/ML) PREMIX INFUSION
0.0000 mg/h | INTRAVENOUS | Status: DC
  Administered 2019-04-20 – 2019-04-21 (×2): 5 mg/h via INTRAVENOUS
  Filled 2019-04-20 (×2): qty 100

## 2019-04-20 MED ORDER — GLYCOPYRROLATE 1 MG PO TABS
1.0000 mg | ORAL_TABLET | ORAL | Status: DC | PRN
  Filled 2019-04-20: qty 1

## 2019-04-20 MED ORDER — MORPHINE BOLUS VIA INFUSION
5.0000 mg | INTRAVENOUS | Status: DC | PRN
  Administered 2019-04-20: 5 mg via INTRAVENOUS
  Filled 2019-04-20: qty 5

## 2019-04-20 MED ORDER — ACETAMINOPHEN 325 MG PO TABS: 650.0000 mg | ORAL_TABLET | Freq: Four times a day (QID) | ORAL | Status: DC | PRN

## 2019-04-20 MED ORDER — HALOPERIDOL LACTATE 5 MG/ML IJ SOLN: 1.0000 mg | INTRAMUSCULAR | Status: DC | PRN

## 2019-04-20 NOTE — Progress Notes (Addendum)
PCCM Communication Note   I came to visit with the patient's mother and patient at the bedside. The patient's mother had questions regarding the plan of care after the patient was transitioned to comfort care.  I discussed my previous conversation between myself and the patient regarding severity of acute and chronic critical illness as well as code status. I shared the patient's words regarding desires not to be intubated, not to have CPR or cardioversion, not to receive pressors and not to receive invasive measures such as dialysis if they were indicated. We discussed the patient's previous mental status during this conversation. We discussed the patient's wish to focus medical interventions on comfort and symptom management.   The patient's mother verbalized understanding of this and was very thankful for clarification and discussion regarding previous conversations. I expressed my gratitude for allowing PCCM to care for her daughter and provided emotional support.   We discussed at length the patient's complex history with substance abuse and career goals in Human resources officer. We discussed the tremendous support and dedication the patient's mother has provided during such complicated medical courses. The patient's mother shared several fond memories of the patient and stated "You know, I think God has given her a lot of second chances but I think this time he is calling her home." I provided emotional comfort and support.  We again discussed present illness and comfort care measures. We discussed inpatient versus outpatient palliative options. The mother agreed that inpatient, comfort care is most appropriate given the patient's expressed wishes and current illness. The patient's mother shared more memories of the patient at which time we were joined by Dr. Domingo Cocking who provided emotional support for the patient's mother and expressed his dedication to caring for the patient and ensuring her symptoms are  managed.   After these conversations, the plan of care is determined to be:  Code Status: DNR/DNI Comfort Care: inpatient palliative care Palliative Care Medicine is following and we greatly appreciate their care.    Additional Critical Care Time : 53 minutes    Eliseo Gum MSN, AGACNP-BC [place] [phone] If no answer, [phone] 04/20/2019, 5:51 PM

## 2019-04-20 NOTE — Progress Notes (Signed)
Patient's mother, Jocelin Schuelke just called this RN and updated patient's condition.

## 2019-04-20 NOTE — Consult Note (Signed)
Palliative care progress note  Reason for consult: Terminal care in light of sepsis, endocarditis and DIC  Palliative care consult received.  Discussed with Dr. Nelda Marseille at length.  Ms. Melder is a 33 year old female who is well-known to the palliative medicine service from previous admissions.  She has a long history of polysubstance abuse with bacteremia, discitis, intubation and tracheostomy who compensated earlier today and was transitioned to ICU where conversation was had with patient by multiple providers and staff regarding care plan moving forward.  During this conversation, it was clear that Ms. Winiarski has stated that she would not want further aggressive interventions such as intubation or CPR.  Options for care moving forward were discussed and she elected to transition to comfort care.  Palliative medicine consulted for family support and continued care management for comfort when she is transitioned out of the ICU.  I received a call that patient's mother was at the bedside and is reported to disagree with plan for comfort care.  I met with her and we had a long discussion regarding the continued difficulty she has had and watching Scarlett struggle with her addiction and continued decline related to substance abuse.  She states that she relies on her faith and has been hopeful that God would spare her despite her continued relapses.  She then expressed concern that Lewanna told her that she did not want to die and she wants to ensure that Tiyanna understood conversation regarding plan for transition to comfort care.  We discussed how critically ill Thelia is at this point, and while no one would want to see her die, the reality is that she is approaching the end of her life regardless of interventions.   I asked her what she would like to see happen moving forward for [facility].  She reports that she would want for her to revert back to full code and continue with aggressive  interventions including mechanical ventilation, antibiotics, and aggressive medical management.  We discussed options for trying to determine best plan forward as she disagrees with plan for comfort and she requested that I call the ethics team to weigh in.  I called and received callback from Shon Baton with the ethics on-call pager.  We reviewed the case in detail.  As patient was alert and competent to make decisions, ethics supports honoring her stated wishes to critical care team to forego heroic interventions and focus only on comfort moving forward.  After discussing with ethics, I returned to the room where patient was speaking with Eliseo Gum, NP from critical care medicine.  As Shirlee Limerick was able to fully explain clinical course and prognosis to her mother, was one of the people who directly discussed care plan with Angelynn, and was able to relay Khristina's words to her mother, her mother is now in agreement with plan moving forward to focus on comfort.  We then provided emotional support to patient's mother and discussed anticipatory guidance on next steps moving forward.  Exam:  Gen: critically ill, jaundiced and does not respond to verbal or tactile stimulation.   Lungs: Breathing is nonlabored and she is not tachypneic at this time. CV: Tachycardic Abd: soft nondistended  Plan: -DNR/DNI -Full comfort care moving forward.  She is currently on morphine infusion of 5 mg/h and resting comfortably. -I have left my contact information with bedside RN.  I have asked staff to call me overnight [phone]) for new orders if there are any concerns about her comfort moving forward. -Anticipate  hospital death. - Appreciate PCCM and ethics input.  Total time: 95 minutes Greater than 50%  of this time was spent counseling and coordinating care related to the above assessment and plan.  Micheline Rough, MD [facility] [phone]

## 2019-04-20 NOTE — Progress Notes (Signed)
Palliative care progress note  Discussed case with Dr. Nelda Marseille.    Mother was at bedside and I came to meet with her, however, she has since left the bedside.    Attempted to call her cell phone and left a voicemail requesting return call.  I saw and examined Ms. Molly [name].  She did not arouse to verbal or tactile stimulation.  Appears comfortable on my exam.  Await return call.  Micheline Rough, MD [place] Team [phone]

## 2019-04-20 NOTE — Progress Notes (Signed)
Central line bleeding: Discussed with [place], Therapist, sports. Recommended reinforcing dressing as line was placed less than 24 hours ago. Noted pt's critically low PLT.  May need evaluation by IR if bleeding persists.

## 2019-04-20 NOTE — Consult Note (Addendum)
PULMONARY / CRITICAL CARE MEDICINE   NAME:  Molly [name], MRN:  [id number], DOB:  1986/01/11, LOS: 4 ADMISSION DATE:  04/15/2019, CONSULTATION DATE: 04/17/2019 REFERRING MD: [facility]ad, CHIEF COMPLAINT: Pain  BRIEF HISTORY:    History of polysubstance abuse with multiple interventions for MRSA bacteremia discitis. HISTORY OF PRESENT ILLNESS   33 year old with a long complicated history of polysubstance abuse leading to bacteremia, discitis, intubation and tracheostomy and long history of multiple antimicrobial therapy interventions.  She was recently discharged home in May 2020 and returns have not resumed IV drug abuse.  Blood cultures are positive for MRSA and Serratia.  She complains of back pain and pain all over.  She is awake alert in no acute distress at time of examination on 04/17/2019.  Her past medical history as well documented below.  We suspect that she will become worse and require return to the ICU in the future if she proves refractory to antimicrobial interventions.  She will most likely need a transesophageal echocardiogram again.  Infectious diseases following.  Pulmonary critical care will follow along.  SIGNIFICANT PAST MEDICAL HISTORY   Polysubstance abuse Endocarditis Bacteremia Tracheostomy Failure to thrive Renal failure  SIGNIFICANT EVENTS:  7/1 lasix for peripheral edema 7/2 ECHO 25%. PICC placed.  7/3 transfer to ICU for AMS  STUDIES:   7/2 ECHO: LVEF 25-30%. Diffuse LV hypokinesis, small pericardial effusion, Severe tricuspid regurg in setting of incomplete coaptation of damaged leaflets, D shaped interventricular septum--suggests Rv pressure and.or volume overload. PA systolic pressure 43PIRJ 7/1 Renal US: mile ascites, splenomegaly, gallbladder sludge. Increased renal echogenicity bilaterally which is similar to prior.  6/30 BLE Venous doppler: cystic structure in popliteal fossa. No obvious acute thrombosis  6/30 Abd US> gallbladder sludge, borderline  gallbladder thickening, splenomegaly 19.52cm in length with 1120 cc Volume. Increased renal echogenicity bilaterally. Mild bilateral pelvictasis without overt hydronephrosis. Pleural effusions. Ascites.  6/30 CXR> cardiomegaly, multifocal bilateral ASD   CULTURES:  6/27 blood cultures x2+ for MRSA Serratia 6/29 SARS CoV2 negative UCx 6/29> >100,000 ecoli (ceftriaxone,gent, imipenem, nitrofurantoin)  ANTIBIOTICS:  629 Maxipime>>7/2 629 Cubicin>>7/2 629 Zyvox>>7/2 7/2 Ceftaroline >>> LINES/TUBES:  PICC   CONSULTANTS:  ID Heme/Onc PCCM Cardiology  SUBJECTIVE:  Chronically ill, frail young adult female who is very lethargic   CONSTITUTIONAL: BP 112/84   Pulse (!) 113   Temp 98.7 F (37.1 C) (Axillary)   Resp (!) 50   Ht 5\' 1"  (1.549 m)   Wt 49.6 kg   SpO2 99%   BMI 20.66 kg/m   I/O last 3 completed shifts: In: 62 [P.O.:180; Blood:478; IV Piggyback:320] Out: 1884 [Urine:4925]        PHYSICAL EXAM: General: Chronically ill, frail, young adult female, NAD on NRB.  Neuro: Somnolent. Awakens to voice, oriented. Following commands. Weak BUE BLE. HEENT: NCAT. Healed tracheostomy scar. Pink mmm. Icteric sclera.  Cardiovascular: Distant heart sounds. RRR. Tr murmur. JVD.  Lungs: very shallow respirations, no accessory muscle recruitment. Symmetrical chest expansion on NRB  Abdomen: + splenomegaly. Round, soft abdomen. + bowel sounds  Musculoskeletal: No obvious joint deformity. Symmetrical bulk and tone. BLE edema  Skin: Jaundice in appearance. Clean, dry, cool.   RESOLVED PROBLEM LIST   ASSESSMENT AND PLAN    Goals of Care: I spoke at length with the patient at the bedside regarding severity of current illness and chronic illnesses. We then discussed code status.  When asked about intubation, patient stated, "Hell no." I confirmed that patient would decline intubation in event of respiratory failure,  respiratory distress. She confirmed No intubation.  We discussed  CPR, cardioversion. The patient stated "No, none of that." I confirmed No CPR, No Cardioversion in event of arrest. Patient confirmed.  Patient wishes are DNR/DNI  PCCM Attending discussed with patient and confirmed code status. PCCM Attending discussed goals of care with patient and patient's mother, patient wishes are DNR/DNI and to transition to comfort care. -initiate comfort care order set when mother arrives -will transfer patient to palliative care    Thank you for consulting PCCM. At this time we will sign off with [facility] to resume care.  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 Continues IVDU, recent septic emboli.  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  Questionable component of pneumonia versus IV drug abuse No evidence of current respiratory distress however mentation is declining Patient moved to ICU  After arriving to ICU, discussed code status as above and is now DNR/DNI   Splenomegaly, chronic P No intervention at this time, patient to transition to comfort care   Hyperbilirubinemia P No intervention needed, patient wishes transition to comfort care  Malnutrition P No intervention at this time in setting of comfort care   Best Practice / Goals of Care / Disposition.   DVT PROPHYLAXIS: None in setting of DIC  SUP: PPI as needed NUTRITION: Diet as tolerated MOBILITY: Currently on bedrest GOALS OF CARE: Currently full code FAMILY DISCUSSIONS: Patient spoke to at length at bedside no family at bedside DISPOSITION Transfer to ICU   LABS  Glucose Recent Labs  Lab 04/17/19 0753 04/18/19 0751 04/20/19 0748  GLUCAP 73 71 93    BMET Recent Labs  Lab 04/17/19 0721  04/17/19 2119 04/18/19 0201 04/19/19 1720  NA 130*   < > 131* 133* 136  K 3.9  --   --  3.9 3.5  CL 101  --   --  104 104  CO2 13*  --   --  14* 19*  BUN 55*  --   --  66* 85*  CREATININE 2.49*  --   --  2.61* 2.67*  GLUCOSE 76  --   --  66* 85   < > = values in this interval not displayed.    Liver Enzymes Recent Labs  Lab 04/17/19 0721 04/18/19 0201 04/19/19 1720  AST 17 14* 14*  ALT 10 10 8   ALKPHOS 61 58 48  BILITOT  6.4* 8.0* 6.4*  6.3*  ALBUMIN 1.6* 1.4* 1.4*    Electrolytes Recent Labs  Lab 04/17/19 0721 04/18/19 0201 04/19/19 1720  CALCIUM 7.6* 7.5* 7.6*  MG 1.4*  --   --     CBC Recent Labs  Lab 04/17/19 1422 04/18/19 0201 04/19/19 1720  WBC 6.4 6.1 3.8*  HGB 8.8* 9.0* 7.6*  HCT 26.9* 28.1* 23.8*  PLT 7* 11*  10* 5*  6*    ABG No results for input(s): PHART, PCO2ART, PO2ART in the last 168 hours.  Coag's Recent Labs  Lab 04/17/19 0721 04/18/19 0201 04/19/19 1720  APTT 51* 44* 45*  INR 2.2* 2.2*   2.1* 1.7*  1.7*    Sepsis Markers Recent Labs  Lab 04/17/19 0721  04/17/19 1440 04/18/19 0201 04/19/19 1700 04/19/19 1722  LATICACIDVEN  --    < > 3.9* 3.4* 1.6  --   PROCALCITON 46.86  --   --  47.37  --  34.92   < > = values in this interval not displayed.    Cardiac Enzymes No results for input(s): TROPONINI, PROBNP in the last 168 hours.    Critical Care Time 65 minutes  Molly Gum MSN, AGACNP-[facility] [phone] If no answer, [phone] 04/20/2019, 10:23 AM  Attending Note:  33 year old female with IVDA history how has been in and out of the hospital for months with endocarditis who is not a surgical candidate and continues to use.  She presents back to PCCM with respiratory failure and florid sepsis.  On exam, she is alert and oriented to self, place and time with coarse BS diffusely.  I reviewed CXR myself, diffuse infiltrate noted.  Discussed with PCCM-NP, TRH-MD and palliative care MD.  I spoke with the patient extensively, she does not wish for intubation, CPR, cardioversion and requesting medications for comfort only.  She is was made aware and made to repeat that if we are not to intubate her then she will die and that if we start any medication for comfort she will die.  She expressed understand.  I called mother Heaven Meeker to inform her of the patient's decisions.  She acknowledged that and informed me that she agrees with and will support the patient's decision but requested not starting comfort medications till she gets here to see her daughter which I gave her instructions on how to come in the hospital.  I communicated with Dr. Domingo Cocking from palliative that we will place transfer orders to the palliative care floor and that if mother arrives to the hospital while patient is physically in 33M then I will speak with her.  If she arrives after patient leaves for 6N then he will speak with her.  Will make patient a full DNR.  Place and  hold withdraw orderset to be released once the patient's mother is here and ready.  The patient is critically ill with multiple organ systems failure and requires high complexity decision making for assessment and support, frequent evaluation and titration of therapies, application of advanced monitoring technologies and extensive interpretation of multiple databases.   Critical Care Time devoted to patient care services described in this note is  45  Minutes. This time reflects time of care of this signee Dr [name]. This critical care time does not reflect procedure time, or teaching time or supervisory time of PA/NP/Med student/Med Resident etc but could involve care discussion time.  Rush Farmer, M.D. [facility] Pulmonary/Critical Care Medicine. Pager: [phone].  After hours pager: [phone]

## 2019-04-20 NOTE — Progress Notes (Signed)
Blood bank called and advised only Rh+ platelets are available. Spoke with Dr. Benay Spice that advised ok to administer. Will monitor patient

## 2019-04-20 NOTE — Progress Notes (Signed)
[facility]  Date of Admission:  04/13/2019   Total days of antibiotics 5        Day 2 of ceftaroline          Principal Problem:   Sepsis due to methicillin resistant Staphylococcus aureus (MRSA) with disseminated intravascular coagulation ([place]) Active Problems:   MRSA bacteremia   Endocarditis of tricuspid valve   Sepsis due to Serratia ([place])   Septic pulmonary embolism (HCC)   Polysubstance abuse ([place])   Thrombocytopenia ([place])   AKI (acute kidney injury) ([place])   . Chlorhexidine Gluconate Cloth  6 each Topical Q0600  . furosemide  40 mg Intravenous Q12H  . mupirocin ointment  1 application Nasal BID  . sodium bicarbonate  650 mg Oral TID  . sodium chloride flush  3 mL Intravenous Q12H  . sodium chloride flush  3 mL Intravenous Q12H  . Thrombi-Pad  1 each Topical Once   ASSESSMENT: Molly [name] is a 33 yo F w/ PMH of IV drug use, MRSA tricuspid valve endocarditis, epidural abscess, CKD and seizures admit for MRSA and Serratia marcescens bacteremia on hospital day 4  PLAN: 1. Serratia marcescens / MRSA bacteremia: Repeat blood culture following abx NGTD at 48 hours. Afebrile overnight. Blood pressure stable at 107/82 but continues to be tachycardic, tachypneic Procalcitonin checked overnight trend down from 47.37->34.92  - C/w IV ceftaroline  - C/w f/u repeat blood culture  2. Bacterial Endocarditis w/ Tricuspid Vegetation: Large mobile vegetation on tricuspid valve with severe regurgitation with reduced EF. Urinary output improving with improved swelling on exam. Poor candidate for TEE due to current DIC thrombocytopenia which worsened overnight.  - C/w abx therapy as above  3. DIC 2/2 sepsis: Platelet count down to 5. Receiving platelet transfusion overnight. Bleeding noted at IJ site planned for evaluation by IR  - Management per heme/onc: platelet transfusion, cryoprecipitate  SUBJECTIVE: Molly [name] was examined and evaluated at bedside  this AM. She was noted to be disoriented and somnolent with difficulty answering questions or following directions but appeared less distressed compared to yesterday. She was observed intremiteently yelling out 'ow.' Per nursing staff, she has had improved urinary output overnight but has been complaining of back pain less than yesterday.  Review of Systems: Review of Systems  Reason unable to perform ROS: AMS.   Past Medical History:  Diagnosis Date  . Abnormal urinalysis 04/22/2018  . 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 Abscess of skin    buttock - most recently 2009/10  . Acute cystitis with hematuria   . Acute kidney failure ([place])   . Acute septic pulmonary embolism with acute cor pulmonale (June 2019) 04/22/2018  . Cellulitis of multiple sites of right hand and fingers 07/07/2013  . CKD (chronic kidney disease) stage 3, GFR 30-59 ml/min (HCC)   . DIC (disseminated intravascular coagulation) ([place])   . Drug-seeking behavior   . Encounter for orogastric (OG) tube placement   . Facial edema 03/21/2019  . Hypersplenism   . IV drug user   . Leg edema 03/21/2019  . Leukopenia   . MRSA (methicillin resistant Staphylococcus aureus) infection   . MRSA bacteremia   . Multifocal pneumonia   . Noncompliance 04/22/2018  . Palliative care by specialist   . Pancytopenia ([place])   . 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 Sepsis ([place]) 01/20/2019  . Sepsis with disseminated intravascular coagulopathy (DIC) ([place]) 01/20/2019  . Sepsis  with multi-organ dysfunction ([place]) 04/02/2019  . Sepsis with multiple organ dysfunction (MOD) ([place]) 04/08/2018  . Septic pulmonary embolism ([place])   . Splenomegaly   . Tobacco abuse    Social History   Tobacco Use  . Smoking status: Current Every Day Smoker    Packs/day: 0.50    Years: 7.00    Pack years: 3.50    Types: Cigarettes  . Smokeless tobacco: Never Used  . Tobacco comment: slowing down  Substance Use Topics  . Alcohol use: No  . Drug use: Yes     Types: IV, Oxycodone    Comment: heroin   Family History  Problem Relation Age of Onset  . Alcoholism Father    Allergies  Allergen Reactions  . Lactose Intolerance ([place])   . Vancomycin Other (See Comments)    Possible contributor to AKI and thrombocytopenia   OBJECTIVE: Vitals:   04/20/19 0500 04/20/19 0632 04/20/19 0650 04/20/19 0752  BP:  107/83 110/79 107/82  Pulse:  (!) 113 (!) 110 (!) 113  Resp:  (!) 42 (!) 44 (!) 50  Temp:  99.7 F (37.6 C) 99 F (37.2 C) 98.6 F (37 C)  TempSrc:  Oral Oral Oral  SpO2:  97% 100% 99%  Weight: 49.6 kg     Height:       Body mass index is 20.66 kg/m.  Physical Exam Constitutional:      General: She is in acute distress.     Appearance: She is toxic-appearing.  HENT:     Mouth/Throat:     Mouth: Mucous membranes are dry.     Pharynx: Oropharynx is clear.  Cardiovascular:     Rate and Rhythm: Regular rhythm. Tachycardia present.     Pulses: Normal pulses.     Heart sounds: Normal heart sounds.  Pulmonary:     Effort: Respiratory distress present.     Breath sounds: Rales (bibasilar rales) present.  Abdominal:     General: Abdomen is flat.     Palpations: Abdomen is soft.  Musculoskeletal: Normal range of motion.        General: Swelling (2+ pitting edema bilateral lower extremities up to knees) present.  Skin:    General: Skin is warm and dry.     Coloration: Skin is jaundiced.     Findings: Rash (lower extremity petechiae) present.  Neurological:     Mental Status: She is disoriented.    Lab Results Lab Results  Component Value Date   WBC 3.8 (L) 04/19/2019   HGB 7.6 (L) 04/19/2019   HCT 23.8 (L) 04/19/2019   MCV 93.0 04/19/2019   PLT 5 (LL) 04/19/2019   PLT 6 (LL) 04/19/2019    Lab Results  Component Value Date   CREATININE 2.67 (H) 04/19/2019   BUN 85 (H) 04/19/2019   NA 136 04/19/2019   K 3.5 04/19/2019   CL 104 04/19/2019   CO2 19 (L) 04/19/2019    Lab Results  Component Value Date   ALT 8  04/19/2019   AST 14 (L) 04/19/2019   ALKPHOS 48 04/19/2019   BILITOT 6.3 (H) 04/19/2019   BILITOT 6.4 (H) 04/19/2019    Microbiology: Recent Results (from the past 240 hour(s))  Blood Culture (routine x 2)     Status: Abnormal   Collection Time: 03/24/2019  6:54 PM   Specimen: BLOOD RIGHT ARM  Result Value Ref Range Status   Specimen Description BLOOD RIGHT ARM  Final   Special Requests   Final  BOTTLES DRAWN AEROBIC AND ANAEROBIC Blood Culture results may not be optimal due to an inadequate volume of blood received in culture bottles   Culture  Setup Time   Final    GRAM POSITIVE COCCI IN CLUSTERS GRAM NEGATIVE RODS IN BOTH AEROBIC AND ANAEROBIC BOTTLES CRITICAL RESULT CALLED TO, READ BACK BY AND VERIFIED WITH: Dala Dock SINCLAIR [id number] MLM Performed at [facility], [place] [address], [place], [place] [address]    Culture (A)  Final    METHICILLIN RESISTANT STAPHYLOCOCCUS AUREUS SERRATIA MARCESCENS    Report Status 04/19/2019 FINAL  Final   Organism ID, Bacteria METHICILLIN RESISTANT STAPHYLOCOCCUS AUREUS  Final   Organism ID, Bacteria SERRATIA MARCESCENS  Final      Susceptibility   Methicillin resistant staphylococcus aureus - MIC*    CIPROFLOXACIN >=8 RESISTANT Resistant     ERYTHROMYCIN >=8 RESISTANT Resistant     GENTAMICIN <=0.5 SENSITIVE Sensitive     OXACILLIN >=4 RESISTANT Resistant     TETRACYCLINE <=1 SENSITIVE Sensitive     VANCOMYCIN 1 SENSITIVE Sensitive     TRIMETH/SULFA <=10 SENSITIVE Sensitive     CLINDAMYCIN >=8 RESISTANT Resistant     RIFAMPIN <=0.5 SENSITIVE Sensitive     Inducible Clindamycin NEGATIVE Sensitive     * METHICILLIN RESISTANT STAPHYLOCOCCUS AUREUS   Serratia marcescens - MIC*    CEFAZOLIN >=64 RESISTANT Resistant     CEFEPIME <=1 SENSITIVE Sensitive     CEFTAZIDIME <=1 SENSITIVE Sensitive     CEFTRIAXONE <=1 SENSITIVE Sensitive     CIPROFLOXACIN <=0.25 SENSITIVE Sensitive     GENTAMICIN <=1 SENSITIVE Sensitive      TRIMETH/SULFA <=20 SENSITIVE Sensitive     * SERRATIA MARCESCENS  Blood Culture ID Panel (Reflexed)     Status: Abnormal   Collection Time: 04/15/2019  6:54 PM  Result Value Ref Range Status   Enterococcus species NOT DETECTED NOT DETECTED Final   Listeria monocytogenes NOT DETECTED NOT DETECTED Final   Staphylococcus species DETECTED (A) NOT DETECTED Final    Comment: PHARMD E SINCLAIR [id number] 0830 MLM   Staphylococcus aureus (BCID) DETECTED (A) NOT DETECTED Final    Comment: Methicillin (oxacillin)-resistant Staphylococcus aureus (MRSA). MRSA is predictably resistant to beta-lactam antibiotics (except ceftaroline). Preferred therapy is vancomycin unless clinically contraindicated. Patient requires contact precautions if  hospitalized. PHARMD E SINCLAIR [id number] 0830 MLM    Methicillin resistance DETECTED (A) NOT DETECTED Final    Comment: CRITICAL RESULT CALLED TO, READ BACK BY AND VERIFIED WITH: PHARMD E SINCLAIR [id number] 0830 MLM    Streptococcus species NOT DETECTED NOT DETECTED Final   Streptococcus agalactiae NOT DETECTED NOT DETECTED Final   Streptococcus pneumoniae NOT DETECTED NOT DETECTED Final   Streptococcus pyogenes NOT DETECTED NOT DETECTED Final   Acinetobacter baumannii NOT DETECTED NOT DETECTED Final   Enterobacteriaceae species DETECTED (A) NOT DETECTED Final    Comment: Enterobacteriaceae represent a large family of gram-negative bacteria, not a single organism. PHARMD E SINCLAIR [id number] 0830 MLM    Enterobacter cloacae complex NOT DETECTED NOT DETECTED Final   Escherichia coli NOT DETECTED NOT DETECTED Final   Klebsiella oxytoca NOT DETECTED NOT DETECTED Final   Klebsiella pneumoniae NOT DETECTED NOT DETECTED Final   Proteus species NOT DETECTED NOT DETECTED Final   Serratia marcescens DETECTED (A) NOT DETECTED Final    Comment: PHARMD E SINCLAIR [id number] 0830 MLM   Carbapenem resistance NOT DETECTED NOT DETECTED Final   Haemophilus influenzae NOT DETECTED NOT DETECTED  Final   Neisseria  meningitidis NOT DETECTED NOT DETECTED Final   Pseudomonas aeruginosa NOT DETECTED NOT DETECTED Final   Candida albicans NOT DETECTED NOT DETECTED Final   Candida glabrata NOT DETECTED NOT DETECTED Final   Candida krusei NOT DETECTED NOT DETECTED Final   Candida parapsilosis NOT DETECTED NOT DETECTED Final   Candida tropicalis NOT DETECTED NOT DETECTED Final    Comment: Performed at [facility], [place] [address] [place], [place] [address]  SARS Coronavirus 2 (CEPHEID - Performed in [facility] hospital lab), Hosp Order     Status: None   Collection Time: 04/01/2019  7:18 PM   Specimen: Nasopharyngeal Swab  Result Value Ref Range Status   SARS Coronavirus 2 NEGATIVE NEGATIVE Final    Comment: (NOTE) If result is NEGATIVE SARS-CoV-2 target nucleic acids are NOT DETECTED. The SARS-CoV-2 RNA is generally detectable in upper and lower  respiratory specimens during the acute phase of infection. The lowest  concentration of SARS-CoV-2 viral copies this assay can detect is 250  copies / mL. A negative result does not preclude SARS-CoV-2 infection  and should not be used as the sole basis for treatment or other  patient management decisions.  A negative result may occur with  improper specimen collection / handling, submission of specimen other  than nasopharyngeal swab, presence of viral mutation(s) within the  areas targeted by this assay, and inadequate number of viral copies  (<250 copies / mL). A negative result must be combined with clinical  observations, patient history, and epidemiological information. If result is POSITIVE SARS-CoV-2 target nucleic acids are DETECTED. The SARS-CoV-2 RNA is generally detectable in upper and lower  respiratory specimens dur ing the acute phase of infection.  Positive  results are indicative of active infection with SARS-CoV-2.  Clinical  correlation with patient history and other diagnostic information is  necessary to  determine patient infection status.  Positive results do  not rule out bacterial infection or co-infection with other viruses. If result is PRESUMPTIVE POSTIVE SARS-CoV-2 nucleic acids MAY BE PRESENT.   A presumptive positive result was obtained on the submitted specimen  and confirmed on repeat testing.  While 2019 novel coronavirus  (SARS-CoV-2) nucleic acids may be present in the submitted sample  additional confirmatory testing may be necessary for epidemiological  and / or clinical management purposes  to differentiate between  SARS-CoV-2 and other Sarbecovirus currently known to infect humans.  If clinically indicated additional testing with an alternate test  methodology [phone]) is advised. The SARS-CoV-2 RNA is generally  detectable in upper and lower respiratory sp ecimens during the acute  phase of infection. The expected result is Negative. Fact Sheet for Patients:  StrictlyIdeas.no Fact Sheet for Healthcare Providers: BankingDealers.co.za This test is not yet approved or cleared by the [place] FDA and has been authorized for detection and/or diagnosis of SARS-CoV-2 by FDA under an Emergency Use Authorization (EUA).  This EUA will remain in effect (meaning this test can be used) for the duration of the COVID-19 declaration under Section 564(b)(1) of the Act, 21 U.S.C. section 360bbb-3(b)(1), unless the authorization is terminated or revoked sooner. Performed at [facility], [place] [address] [place], [place] [address]   Urine culture     Status: Abnormal (Preliminary result)   Collection Time: 03/21/2019  7:55 PM   Specimen: Urine, Random  Result Value Ref Range Status   Specimen Description URINE, RANDOM  Final   Special Requests NONE  Final   Culture >=100,000 COLONIES/mL ESCHERICHIA  COLI (A)  Final   Report Status PENDING  Incomplete   Organism ID, Bacteria ESCHERICHIA COLI (A)  Final      Susceptibility    Escherichia coli - MIC*    AMPICILLIN >=32 RESISTANT Resistant     CEFAZOLIN 32 INTERMEDIATE Intermediate     CEFTRIAXONE <=1 SENSITIVE Sensitive     CIPROFLOXACIN >=4 RESISTANT Resistant     GENTAMICIN <=1 SENSITIVE Sensitive     IMIPENEM <=0.25 SENSITIVE Sensitive     NITROFURANTOIN <=16 SENSITIVE Sensitive     TRIMETH/SULFA >=320 RESISTANT Resistant     AMPICILLIN/SULBACTAM >=32 RESISTANT Resistant     PIP/TAZO >=128 RESISTANT Resistant     Extended ESBL Value in next row Sensitive      NEGATIVEPerformed at [facility] [address] [place], [place] [address]    * >=100,000 COLONIES/mL ESCHERICHIA COLI  Blood Culture (routine x 2)     Status: Abnormal   Collection Time: 03/22/2019  8:53 PM   Specimen: BLOOD RIGHT FOREARM  Result Value Ref Range Status   Specimen Description BLOOD RIGHT FOREARM  Final   Special Requests   Final    BOTTLES DRAWN AEROBIC ONLY Blood Culture results may not be optimal due to an inadequate volume of blood received in culture bottles   Culture  Setup Time   Final    GRAM POSITIVE COCCI GRAM NEGATIVE RODS AEROBIC BOTTLE ONLY    Culture (A)  Final    STAPHYLOCOCCUS AUREUS SERRATIA MARCESCENS SUSCEPTIBILITIES PERFORMED ON PREVIOUS CULTURE WITHIN THE LAST 5 DAYS. Performed at [facility], [place] [address], [place], [place] [address]    Report Status 04/19/2019 FINAL  Final  MRSA PCR Screening     Status: Abnormal   Collection Time: 04/17/19  1:10 AM   Specimen: Nasal Mucosa; Nasopharyngeal  Result Value Ref Range Status   MRSA by PCR POSITIVE (A) NEGATIVE Final    Comment:        The GeneXpert MRSA Assay (FDA approved for NASAL specimens only), is one component of a comprehensive MRSA colonization surveillance program. It is not intended to diagnose MRSA infection nor to guide or monitor treatment for MRSA infections. RESULT CALLED TO, READ BACK BY AND VERIFIED WITH: BUENDIA,A RN [phone] 04/17/2019 MITCHELL,L Performed at [facility], [place] [address], [place], [place] [address]   Culture, blood (routine x 2)     Status: None (Preliminary result)   Collection Time: 04/18/19 10:50 AM   Specimen: BLOOD RIGHT HAND  Result Value Ref Range Status   Specimen Description BLOOD RIGHT HAND  Final   Special Requests   Final    BOTTLES DRAWN AEROBIC ONLY Blood Culture adequate volume   Culture   Final    NO GROWTH 2 DAYS Performed at [facility], [place] [address], [place], [place] [address]    Report Status PENDING  Incomplete  Culture, blood (routine x 2)     Status: None (Preliminary result)   Collection Time: 04/18/19 11:05 AM   Specimen: BLOOD RIGHT HAND  Result Value Ref Range Status   Specimen Description BLOOD RIGHT HAND  Final   Special Requests   Final    BOTTLES DRAWN AEROBIC ONLY Blood Culture results may not be optimal due to an inadequate volume of blood received in culture bottles   Culture   Final    NO GROWTH 2 DAYS Performed at [facility], [place] [address], [place], [place] [address]  Report Status PENDING  Incomplete    Mosetta Anis, [facility] [phone] pager   [phone] cell 04/20/2019, 8:07 AM

## 2019-04-20 NOTE — Progress Notes (Addendum)
PROGRESS NOTE    [employer]  ZHY:[id number] DOB: Feb 23, 1986 DOA: 03/28/2019 PCP: Patient, No Pcp Per   Brief Narrative: Patient is a 33 year old female with history of IV drug abuse, MRSA tricuspid valve endocarditis, history of epidural abscess, chronic renal insufficiency, seizures, noncompliance who presents to the emergency department for the evaluation of progressive leg swelling, shortness of breath and generalized body aches and pains.  On presentation, she was found to be afebrile, tachycardic, tachypneic and hypotensive.  Chest x-ray was concerning for multifocal airspace opacities bilaterally suggesting multifocal pneumonia as well as cardiomegaly and volume overload.  She was found to have hyponatremia, hypokalemia, acute kidney injury.  New thrombocytopenia with platelets of 8000, anemia, elevated lactic acid and elevated INR. Blood cultures have been sent which has shown MRSA and Serratia.  She has been started on broad-spectrum antibiotics.  ID already consulted. She has picture of DIC most likely associated with sepsis.  ID,PCCM and hematology following.  7/1: Started on lasix for severe peripheral edema 7/2: Echocardiogram shows ejection fraction of 25 to 30%.  Cardiology consulted.  Antibiotics changed to Teflaro.  Undergoing PICC line placement.  Labs still pending because lab technician were unable to get blood. 7/3: Patient became lethargic and tachypneic this morning.  Patient transferred to ICU.  She was made DNR over there.  Palliative care consulted.  Now initiating full comfort measures.  Assessment & Plan:   Principal Problem:   Sepsis due to methicillin resistant Staphylococcus aureus (MRSA) with disseminated intravascular coagulation ([place]) Active Problems:   Septic pulmonary embolism (HCC)   MRSA bacteremia   Polysubstance abuse ([place])   Endocarditis of tricuspid valve   Thrombocytopenia (HCC)   Sepsis due to Serratia ([place])   AKI (acute kidney injury) ([place])   DIC: Has severe thrombocytopenia (new), elevated Ddimer,elevated INR, low fibrinogen, elevated APTT.  Given vitamin K.  S/P transfusion with 1 unit of platelets.  Hematology was following.  Her DIC is associated with sepsis.  Bacteremia/UTI: Blood cultures showed MRSA and Serratia.  Urine culture showing E. Coli. Was on Teflaro.   Abx discontinued.   Echocardiogram showed ejection fraction of 25 to 30%, impaired left ventricular relaxation, mobile vegetation on the tricuspid valve, severe tricuspid regurgitation.    Severe sepsis: Presented with hypotension, elevated lactate level, leukocytosis.    Multifocal pneumonia:  Abx stopped  Acute systolic congestive heart failure: Ejection fraction of 25 to 30% as per echocardiogram.  She was on  Lasix .  Recent history of epidural abscess/discitis/back pain: Recent MRI negative for acute findings.  AKI on CKD stage II:Has severe bilateral lower extremity edema.Ultrasound of the kidneys showed increased bilateral echogenicity.   Hyperbilirubinemia: Most likely secondary to hemolysis.  Normal transaminases and alkaline phosphatase.  Anasarca: Multifactorial.   Secondary to severe congestive heart failure and hypoalbuminemia.    IV drug abuse: Injects fentanyl/heroin.  Last intake a day before admission.  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  Pressure ulcers: Stage I-II sacral           DVT prophylaxis:SCD Code Status: DNR Family Communication: Called mother on phone Disposition Plan: Full comfort care to be initiated after mother sees her. Anticipate hospital death   Consultants: PCCM, hematology, ID  Procedures: None  Antimicrobials:  Anti-infectives (From admission, onward)   Start     Dose/Rate Route Frequency Ordered Stop   04/19/19 1200  ceftaroline (  TEFLARO) 300 mg in sodium chloride 0.9 % 250 mL IVPB   Status:  Discontinued     300 mg 250 mL/hr over 60 Minutes Intravenous Every 8 hours 04/19/19 0850 04/20/19 1205   04/17/19 1515  doxycycline (VIBRA-TABS) tablet 100 mg  Status:  Discontinued     100 mg Oral Every 12 hours 04/17/19 1506 04/19/19 0850   04/17/19 1200  DAPTOmycin (CUBICIN) 500 mg in sodium chloride 0.9 % IVPB  Status:  Discontinued     500 mg 220 mL/hr over 30 Minutes Intravenous Every 48 hours 04/17/19 0847 04/19/19 0850   04/05/2019 2115  ceFEPIme (MAXIPIME) 1 g in sodium chloride 0.9 % 100 mL IVPB  Status:  Discontinued     1 g 200 mL/hr over 30 Minutes Intravenous Every 24 hours 04/10/2019 2100 04/19/19 0850   04/06/2019 2115  linezolid (ZYVOX) IVPB 600 mg     600 mg 300 mL/hr over 60 Minutes Intravenous Once 04/06/2019 2100 04/06/2019 2344      Subjective: Patient seen and examined at bedside this morning.  Very lethargic.  Icteric.  Hardly responsive this morning, tachypneic  Objective: Vitals:   04/20/19 0650 04/20/19 0752 04/20/19 1011 04/20/19 1100  BP: 110/79 107/82 112/84   Pulse: (!) 110 (!) 113  (!) 110  Resp: (!) 44 (!) 50  (!) 47  Temp: 99 F (37.2 C) 98.6 F (37 C) 98.7 F (37.1 C)   TempSrc: Oral Oral Axillary   SpO2: 100% 99%    Weight:      Height:        Intake/Output Summary (Last 24 hours) at 04/20/2019 1252 Last data filed at 04/20/2019 1010 Gross per 24 hour  Intake 1318 ml  Output 4275 ml  Net -2957 ml   Filed Weights   04/18/19 0415 04/19/19 0535 04/20/19 0500  Weight: 53 kg 53.6 kg 49.6 kg    Examination:  General exam: weak, lethargic  Respiratory system: Bilateral decreased air entry Cardiovascular system: S1 & S2 heard, RRR. Gastrointestinal system: Abdomen is mildly distended, soft and nontender. No organomegaly or masses felt. Normal bowel sounds heard. Central nervous system: ,weak and lethargic but arousable on calling her name or shaking her   Extremities: Bilateral lower extremity edema, anasarca Skin: Icterus, skin  breakdowns  Data Reviewed: I have personally reviewed following labs and imaging studies  CBC: Recent Labs  Lab 03/24/2019 2053 04/17/19 0721 04/17/19 1422 04/18/19 0201 04/19/19 1720  WBC 4.8 5.8 6.4 6.1 3.8*  NEUTROABS 3.9 5.0 5.5 5.0 3.2  HGB 8.6* 9.3* 8.8* 9.0* 7.6*  HCT 27.2* 29.7* 26.9* 28.1* 23.8*  MCV 93.5 94.0 92.4 91.2 93.0  PLT 8* 9*  9* 7* 11*  10* 5*  6*   Basic Metabolic Panel: Recent Labs  Lab 03/24/2019 2039 03/26/2019 2053 04/17/19 0721 04/17/19 1422 04/17/19 2119 04/18/19 0201 04/19/19 1720  NA 125*  125* 127* 130* 130* 131* 133* 136  K 6.4*  6.4* 2.8* 3.9  --   --  3.9 3.5  CL 101  101 97* 101  --   --  104 104  CO2  --  16* 13*  --   --  14* 19*  GLUCOSE 73  73 79 76  --   --  66* 85  BUN 71*  71* 53* 55*  --   --  66* 85*  CREATININE 2.20*  2.20* 2.39* 2.49*  --   --  2.61* 2.67*  CALCIUM  --  7.3* 7.6*  --   --  7.5* 7.6*  MG  --   --  1.4*  --   --   --   --    GFR: Estimated Creatinine Clearance: 23 mL/min (A) (by C-G formula based on SCr of 2.67 mg/dL (H)). Liver Function Tests: Recent Labs  Lab 04/04/2019 2053 04/17/19 0721 04/18/19 0201 04/19/19 1720  AST 21 17 14* 14*  ALT 8 10 10 8   ALKPHOS 80 61 58 48  BILITOT 5.4* 6.4* 8.0* 6.4*  6.3*  PROT 5.2* 5.2* 5.0* 5.3*  ALBUMIN 1.6* 1.6* 1.4* 1.4*   No results for input(s): LIPASE, AMYLASE in the last 168 hours. No results for input(s): AMMONIA in the last 168 hours. Coagulation Profile: Recent Labs  Lab 03/22/2019 2053 04/17/19 0721 04/18/19 0201 04/19/19 1720  INR 1.8* 2.2* 2.2*  2.1* 1.7*  1.7*   Cardiac Enzymes: Recent Labs  Lab 04/18/19 0201  CKTOTAL 21*   BNP (last 3 results) No results for input(s): PROBNP in the last 8760 hours. HbA1C: No results for input(s): HGBA1C in the last 72 hours. CBG: Recent Labs  Lab 04/17/19 0753 04/18/19 0751 04/20/19 0748  GLUCAP 73 71 93   Lipid Profile: No results for input(s): CHOL, HDL, LDLCALC, TRIG, CHOLHDL,  LDLDIRECT in the last 72 hours. Thyroid Function Tests: No results for input(s): TSH, T4TOTAL, FREET4, T3FREE, THYROIDAB in the last 72 hours. Anemia Panel: Recent Labs    04/18/19 0201  RETICCTPCT 0.5   Sepsis Labs: Recent Labs  Lab 04/17/19 0721 04/17/19 0742 04/17/19 1440 04/18/19 0201 04/19/19 1700 04/19/19 1722  PROCALCITON 46.86  --   --  47.37  --  34.92  LATICACIDVEN  --  5.5* 3.9* 3.4* 1.6  --     Recent Results (from the past 240 hour(s))  Blood Culture (routine x 2)     Status: Abnormal   Collection Time: 03/19/2019  6:54 PM   Specimen: BLOOD RIGHT ARM  Result Value Ref Range Status   Specimen Description BLOOD RIGHT ARM  Final   Special Requests   Final    BOTTLES DRAWN AEROBIC AND ANAEROBIC Blood Culture results may not be optimal due to an inadequate volume of blood received in culture bottles   Culture  Setup Time   Final    GRAM POSITIVE COCCI IN CLUSTERS GRAM NEGATIVE RODS IN BOTH AEROBIC AND ANAEROBIC BOTTLES CRITICAL RESULT CALLED TO, READ BACK BY AND VERIFIED WITH: Dala Dock SINCLAIR [id number] MLM Performed at [facility], [place] [address], [place], [place] [address]    Culture (A)  Final    METHICILLIN RESISTANT STAPHYLOCOCCUS AUREUS SERRATIA MARCESCENS    Report Status 04/19/2019 FINAL  Final   Organism ID, Bacteria METHICILLIN RESISTANT STAPHYLOCOCCUS AUREUS  Final   Organism ID, Bacteria SERRATIA MARCESCENS  Final      Susceptibility   Methicillin resistant staphylococcus aureus - MIC*    CIPROFLOXACIN >=8 RESISTANT Resistant     ERYTHROMYCIN >=8 RESISTANT Resistant     GENTAMICIN <=0.5 SENSITIVE Sensitive     OXACILLIN >=4 RESISTANT Resistant     TETRACYCLINE <=1 SENSITIVE Sensitive     VANCOMYCIN 1 SENSITIVE Sensitive     TRIMETH/SULFA <=10 SENSITIVE Sensitive     CLINDAMYCIN >=8 RESISTANT Resistant     RIFAMPIN <=0.5 SENSITIVE Sensitive     Inducible Clindamycin NEGATIVE Sensitive     * METHICILLIN RESISTANT STAPHYLOCOCCUS AUREUS    Serratia marcescens - MIC*    CEFAZOLIN >=64 RESISTANT Resistant     CEFEPIME <=1 SENSITIVE  Sensitive     CEFTAZIDIME <=1 SENSITIVE Sensitive     CEFTRIAXONE <=1 SENSITIVE Sensitive     CIPROFLOXACIN <=0.25 SENSITIVE Sensitive     GENTAMICIN <=1 SENSITIVE Sensitive     TRIMETH/SULFA <=20 SENSITIVE Sensitive     * SERRATIA MARCESCENS  Blood Culture ID Panel (Reflexed)     Status: Abnormal   Collection Time: 04/11/2019  6:54 PM  Result Value Ref Range Status   Enterococcus species NOT DETECTED NOT DETECTED Final   Listeria monocytogenes NOT DETECTED NOT DETECTED Final   Staphylococcus species DETECTED (A) NOT DETECTED Final    Comment: PHARMD E SINCLAIR [id number] 0830 MLM   Staphylococcus aureus (BCID) DETECTED (A) NOT DETECTED Final    Comment: Methicillin (oxacillin)-resistant Staphylococcus aureus (MRSA). MRSA is predictably resistant to beta-lactam antibiotics (except ceftaroline). Preferred therapy is vancomycin unless clinically contraindicated. Patient requires contact precautions if  hospitalized. PHARMD E SINCLAIR [id number] 0830 MLM    Methicillin resistance DETECTED (A) NOT DETECTED Final    Comment: CRITICAL RESULT CALLED TO, READ BACK BY AND VERIFIED WITH: PHARMD E SINCLAIR [id number] 0830 MLM    Streptococcus species NOT DETECTED NOT DETECTED Final   Streptococcus agalactiae NOT DETECTED NOT DETECTED Final   Streptococcus pneumoniae NOT DETECTED NOT DETECTED Final   Streptococcus pyogenes NOT DETECTED NOT DETECTED Final   Acinetobacter baumannii NOT DETECTED NOT DETECTED Final   Enterobacteriaceae species DETECTED (A) NOT DETECTED Final    Comment: Enterobacteriaceae represent a large family of gram-negative bacteria, not a single organism. PHARMD E SINCLAIR [id number] 0830 MLM    Enterobacter cloacae complex NOT DETECTED NOT DETECTED Final   Escherichia coli NOT DETECTED NOT DETECTED Final   Klebsiella oxytoca NOT DETECTED NOT DETECTED Final   Klebsiella pneumoniae NOT DETECTED NOT  DETECTED Final   Proteus species NOT DETECTED NOT DETECTED Final   Serratia marcescens DETECTED (A) NOT DETECTED Final    Comment: PHARMD E SINCLAIR [id number] 0830 MLM   Carbapenem resistance NOT DETECTED NOT DETECTED Final   Haemophilus influenzae NOT DETECTED NOT DETECTED Final   Neisseria meningitidis NOT DETECTED NOT DETECTED Final   Pseudomonas aeruginosa NOT DETECTED NOT DETECTED Final   Candida albicans NOT DETECTED NOT DETECTED Final   Candida glabrata NOT DETECTED NOT DETECTED Final   Candida krusei NOT DETECTED NOT DETECTED Final   Candida parapsilosis NOT DETECTED NOT DETECTED Final   Candida tropicalis NOT DETECTED NOT DETECTED Final    Comment: Performed at [facility], [place] [address], [place], [place] [address]  SARS Coronavirus 2 (CEPHEID - Performed in [place] [facility] lab), Hosp Order     Status: None   Collection Time: 03/31/2019  7:18 PM   Specimen: Nasopharyngeal Swab  Result Value Ref Range Status   SARS Coronavirus 2 NEGATIVE NEGATIVE Final    Comment: (NOTE) If result is NEGATIVE SARS-CoV-2 target nucleic acids are NOT DETECTED. The SARS-CoV-2 RNA is generally detectable in upper and lower  respiratory specimens during the acute phase of infection. The lowest  concentration of SARS-CoV-2 viral copies this assay can detect is 250  copies / mL. A negative result does not preclude SARS-CoV-2 infection  and should not be used as the sole basis for treatment or other  patient management decisions.  A negative result may occur with  improper specimen collection / handling, submission of specimen other  than nasopharyngeal swab, presence of viral mutation(s) within the  areas targeted by this assay, and inadequate number of viral copies  (<250 copies /  mL). A negative result must be combined with clinical  observations, patient history, and epidemiological information. If result is POSITIVE SARS-CoV-2 target nucleic acids are DETECTED. The SARS-CoV-2 RNA is  generally detectable in upper and lower  respiratory specimens dur ing the acute phase of infection.  Positive  results are indicative of active infection with SARS-CoV-2.  Clinical  correlation with patient history and other diagnostic information is  necessary to determine patient infection status.  Positive results do  not rule out bacterial infection or co-infection with other viruses. If result is PRESUMPTIVE POSTIVE SARS-CoV-2 nucleic acids MAY BE PRESENT.   A presumptive positive result was obtained on the submitted specimen  and confirmed on repeat testing.  While 2019 novel coronavirus  (SARS-CoV-2) nucleic acids may be present in the submitted sample  additional confirmatory testing may be necessary for epidemiological  and / or clinical management purposes  to differentiate between  SARS-CoV-2 and other Sarbecovirus currently known to infect humans.  If clinically indicated additional testing with an alternate test  methodology [phone]) is advised. The SARS-CoV-2 RNA is generally  detectable in upper and lower respiratory sp ecimens during the acute  phase of infection. The expected result is Negative. Fact Sheet for Patients:  StrictlyIdeas.no Fact Sheet for Healthcare Providers: BankingDealers.co.za This test is not yet approved or cleared by the [place] FDA and has been authorized for detection and/or diagnosis of SARS-CoV-2 by FDA under an Emergency Use Authorization (EUA).  This EUA will remain in effect (meaning this test can be used) for the duration of the COVID-19 declaration under Section 564(b)(1) of the Act, 21 U.S.C. section 360bbb-3(b)(1), unless the authorization is terminated or revoked sooner. Performed at [facility], [place] [address] [place], [place] [address]   Urine culture     Status: Abnormal (Preliminary result)   Collection Time: 03/26/2019  7:55 PM   Specimen: Urine, Random  Result Value  Ref Range Status   Specimen Description URINE, RANDOM  Final   Special Requests NONE  Final   Culture >=100,000 COLONIES/mL ESCHERICHIA COLI (A)  Final   Report Status PENDING  Incomplete   Organism ID, Bacteria ESCHERICHIA COLI (A)  Final      Susceptibility   Escherichia coli - MIC*    AMPICILLIN >=32 RESISTANT Resistant     CEFAZOLIN 32 INTERMEDIATE Intermediate     CEFTRIAXONE <=1 SENSITIVE Sensitive     CIPROFLOXACIN >=4 RESISTANT Resistant     GENTAMICIN <=1 SENSITIVE Sensitive     IMIPENEM <=0.25 SENSITIVE Sensitive     NITROFURANTOIN <=16 SENSITIVE Sensitive     TRIMETH/SULFA >=320 RESISTANT Resistant     AMPICILLIN/SULBACTAM >=32 RESISTANT Resistant     PIP/TAZO >=128 RESISTANT Resistant     Extended ESBL Value in next row Sensitive      NEGATIVEPerformed at [place] [address] [place], [place] [address]    * >=100,000 COLONIES/mL ESCHERICHIA COLI  Blood Culture (routine x 2)     Status: Abnormal   Collection Time: 04/05/2019  8:53 PM   Specimen: BLOOD RIGHT FOREARM  Result Value Ref Range Status   Specimen Description BLOOD RIGHT FOREARM  Final   Special Requests   Final    BOTTLES DRAWN AEROBIC ONLY Blood Culture results may not be optimal due to an inadequate volume of blood received in culture bottles   Culture  Setup Time   Final    GRAM POSITIVE COCCI GRAM NEGATIVE RODS AEROBIC BOTTLE ONLY  Culture (A)  Final    STAPHYLOCOCCUS AUREUS SERRATIA MARCESCENS SUSCEPTIBILITIES PERFORMED ON PREVIOUS CULTURE WITHIN THE LAST 5 DAYS. Performed at [facility], [place] [address] [place], [place] [address]    Report Status 04/19/2019 FINAL  Final  MRSA PCR Screening     Status: Abnormal   Collection Time: 04/17/19  1:10 AM   Specimen: Nasal Mucosa; Nasopharyngeal  Result Value Ref Range Status   MRSA by PCR POSITIVE (A) NEGATIVE Final    Comment:        The GeneXpert MRSA Assay (FDA approved for NASAL specimens only), is one component of a  comprehensive MRSA colonization surveillance program. It is not intended to diagnose MRSA infection nor to guide or monitor treatment for MRSA infections. RESULT CALLED TO, READ BACK BY AND VERIFIED WITH: BUENDIA,A RN [phone] 04/17/2019 MITCHELL,L Performed at [facility], [place] [address] [place], [place] [address]   Culture, blood (routine x 2)     Status: None (Preliminary result)   Collection Time: 04/18/19 10:50 AM   Specimen: BLOOD RIGHT HAND  Result Value Ref Range Status   Specimen Description BLOOD RIGHT HAND  Final   Special Requests   Final    BOTTLES DRAWN AEROBIC ONLY Blood Culture adequate volume   Culture   Final    NO GROWTH 2 DAYS Performed at [facility], [place] [address] [place], [place] [address]    Report Status PENDING  Incomplete  Culture, blood (routine x 2)     Status: None (Preliminary result)   Collection Time: 04/18/19 11:05 AM   Specimen: BLOOD RIGHT HAND  Result Value Ref Range Status   Specimen Description BLOOD RIGHT HAND  Final   Special Requests   Final    BOTTLES DRAWN AEROBIC ONLY Blood Culture results may not be optimal due to an inadequate volume of blood received in culture bottles   Culture   Final    NO GROWTH 2 DAYS Performed at [facility], [place] [address] [place],  [address]    Report Status PENDING  Incomplete         Radiology Studies: US Renal  Result Date: 04/18/2019 CLINICAL DATA:  Acute on chronic renal injury EXAM: RENAL / URINARY TRACT ULTRASOUND COMPLETE COMPARISON:  01/20/2019 FINDINGS: Right Kidney: Renal measurements: 11.8 x 4.7 x 5.8 cm = volume: 169 mL. Diffuse increased echogenicity is identified. Mild pelviectasis is noted although no definitive obstructive changes are seen. Mild ascites is seen Left Kidney: Renal measurements: 11.8 x 5.0 x 6.3 cm = volume: 196 mL. Diffuse increased echogenicity is noted. Mild pelviectasis is noted although no definitive obstructive changes noted. Bladder: Not  well visualized IMPRESSION: Increased echogenicity bilaterally similar to that seen on the prior exam. Mild ascites is noted. Note is also made of splenomegaly and gallbladder sludge similar to that seen on recent ultrasound of the abdomen (04/17/2019). Electronically Signed   By: Inez Catalina M.D.   On: 04/18/2019 17:30   [place] Ekg Site Rite  Result Date: 04/19/2019 If Site Rite image not attached, placement could not be confirmed due to current cardiac rhythm.       Scheduled Meds: . Chlorhexidine Gluconate Cloth  6 each Topical Q0600  . mupirocin ointment  1 application Nasal BID  . sodium chloride flush  3 mL Intravenous Q12H  . sodium chloride flush  3 mL Intravenous Q12H  . Thrombi-Pad  1 each Topical Once   Continuous Infusions: . sodium  chloride 250 mL (04/19/19 1248)     LOS: 4 days    Time spent: 55 mins.More than 50% of that time was spent in counseling and/or coordination of care.      Shelly Coss, MD [facility] Pager [phone]  If 7PM-7AM, please contact night-coverage [email or web address] Password [email or web address] 04/20/2019, 12:52 PM

## 2019-04-20 NOTE — Progress Notes (Signed)
Oozing of blood noted throughout the night from patient's PICC line site.  Pressure held and pressure dressing applied, site continued to ooze blood.  [facility] paged with this information.

## 2019-04-20 NOTE — Progress Notes (Signed)
HEMATOLOGY-ONCOLOGY PROGRESS NOTE  SUBJECTIVE: She is lethargic this morning.  Not answering questions..  I have reviewed the past medical history, past surgical history, social history and family history with the patient and they are unchanged from previous note.   PHYSICAL EXAMINATION:  Vitals:   04/20/19 0650 04/20/19 0752  BP: 110/79 107/82  Pulse: (!) 110 (!) 113  Resp: (!) 44 (!) 50  Temp: 99 F (37.2 C) 98.6 F (37 C)  SpO2: 100% 99%   Filed Weights   04/18/19 0415 04/19/19 0535 04/20/19 0500  Weight: 116 lb 13.5 oz (53 kg) 118 lb 2.7 oz (53.6 kg) 109 lb 5.6 oz (49.6 kg)    Intake/Output from previous day: 07/02 0701 - 07/03 0700 In: 978 [P.O.:180; Blood:478; IV Piggyback:320] Out: 3664 [Urine:4275]  GENERAL: Lethargic, arousable.  EYES: icteric OROPHARYNX: No thrush, no bleeding  ABDOMEN:abdomen soft, splenomegaly NEURO: Arousable, follows some commands Skin: No petechiae, small ecchymoses at IV puncture sites, oozing at right IJ site, jaundice Vascular: Anasarca-improved  LABORATORY DATA:  I have reviewed the data as listed CMP Latest Ref Rng & Units 04/19/2019 04/19/2019 04/18/2019  Glucose 70 - 99 mg/dL - 85 66(L)  BUN 6 - 20 mg/dL - 85(H) 66(H)  Creatinine 0.44 - 1.00 mg/dL - 2.67(H) 2.61(H)  Sodium 135 - 145 mmol/L - 136 133(L)  Potassium 3.5 - 5.1 mmol/L - 3.5 3.9  Chloride 98 - 111 mmol/L - 104 104  CO2 22 - 32 mmol/L - 19(L) 14(L)  Calcium 8.9 - 10.3 mg/dL - 7.6(L) 7.5(L)  Total Protein 6.5 - 8.1 g/dL - 5.3(L) 5.0(L)  Total Bilirubin 0.3 - 1.2 mg/dL 6.4(H) 6.3(H) 8.0(H)  Alkaline Phos 38 - 126 U/L - 48 58  AST 15 - 41 U/L - 14(L) 14(L)  ALT 0 - 44 U/L - 8 10    Lab Results  Component Value Date   WBC 3.8 (L) 04/19/2019   HGB 7.6 (L) 04/19/2019   HCT 23.8 (L) 04/19/2019   MCV 93.0 04/19/2019   PLT 5 (LL) 04/19/2019   PLT 6 (LL) 04/19/2019   NEUTROABS 3.2 04/19/2019    Mr Lumbar Spine Wo Contrast  Result Date: 04/05/2019 CLINICAL DATA:   Bacteremia, history of epidural abscess EXAM: MRI LUMBAR SPINE WITHOUT CONTRAST TECHNIQUE: Multiplanar, multisequence MR imaging of the lumbar spine was performed. No intravenous contrast was administered. COMPARISON:  None. FINDINGS: Segmentation:  Standard. Alignment: No static listhesis. Severe dextroscoliosis of the thoracolumbar spine. Vertebrae: No fracture, evidence of acute discitis, or bone lesion. Old healed discitis at L1-2. Significant interval improvement in discitis at T12-L1 with resolution of the paravertebral and intra discal fluid with mild residual marrow edema along the anterior endplates which is likely reactive. Conus medullaris and cauda equina: Conus extends to the T12 level. Conus and cauda equina appear normal. Paraspinal and other soft tissues: No acute paraspinal abnormality. Diffuse paraspinal muscle atrophy. Disc levels: Disc spaces: Degenerative disc disease with severe disc height loss at L1-2 consistent with he will discitis. Mild degenerative disc disease with disc height loss and reactive endplate edema at Q03-K7. T12-L1: Minimal broad-based disc bulge. No evidence of neural foraminal stenosis. No central canal stenosis. L1-L2: No significant disc bulge. No evidence of neural foraminal stenosis. No central canal stenosis. L2-L3: No significant disc bulge. No evidence of neural foraminal stenosis. No central canal stenosis. L3-L4: No significant disc bulge. No evidence of neural foraminal stenosis. No central canal stenosis. Mild right facet arthropathy. L4-L5: Mild broad-based disc bulge. Mild bilateral  facet arthropathy. No evidence of neural foraminal stenosis. No central canal stenosis. L5-S1: No significant disc bulge. No evidence of neural foraminal stenosis. No central canal stenosis. IMPRESSION: 1. Old healed discitis at L1-2. Significant interval improvement in discitis at T12-L1 with resolution of the paravertebral and intradiscal fluid with mild residual marrow edema  along the anterior endplates which is likely reactive. No evidence of acute discitis-osteomyelitis of the lumbar spine. Electronically Signed   By: Kathreen Devoid   On: 04/05/2019 12:45   US Abdomen Complete  Result Date: 04/17/2019 CLINICAL DATA:  Acute renal liver failure.  IV drug user EXAM: ABDOMEN ULTRASOUND COMPLETE COMPARISON:  None. FINDINGS: Gallbladder: There is sludge in the gallbladder. The gallbladder wall thickness measures 3 mm which is borderline. No Murphy's sign. There is ascites in the, some of which abuts gallbladder. No gallstones noted. Common bile duct: Diameter: 5.6 mm Liver: No focal lesion identified. Within normal limits in parenchymal echogenicity. Portal vein is patent on color Doppler imaging with normal direction of blood flow towards the liver. IVC: No abnormality visualized. Pancreas: Visualized portion unremarkable. Spleen: The spleen measures 19.52 cm in length with a volume 1120 cc consistent with splenomegaly. Right Kidney: Length: 11.8 cm. Increased echogenicity. Mild pelviectasis. Left Kidney: Length: 11.9 cm. Increased echogenicity. Mild pelviectasis. Abdominal aorta: No aneurysm visualized. Other findings: Pleural effusions and ascites identified IMPRESSION: 1. There is sludge in the gallbladder. The gallbladder wall is borderline in thickness. No Murphy's sign or stones. There is ascites in the abdomen, some of which abuts the gallbladder. If there is concern for acute cholecystitis, recommend a HIDA scan. 2. Splenomegaly. 3. Increased echogenicity in both kidneys consistent with history of chronic renal disease. There is mild bilateral pelviectasis without gross hydronephrosis. 4. Pleural effusions and ascites. Electronically Signed   By: Dorise Bullion III M.D   On: 04/17/2019 10:59   US Renal  Result Date: 04/18/2019 CLINICAL DATA:  Acute on chronic renal injury EXAM: RENAL / URINARY TRACT ULTRASOUND COMPLETE COMPARISON:  01/20/2019 FINDINGS: Right Kidney: Renal  measurements: 11.8 x 4.7 x 5.8 cm = volume: 169 mL. Diffuse increased echogenicity is identified. Mild pelviectasis is noted although no definitive obstructive changes are seen. Mild ascites is seen Left Kidney: Renal measurements: 11.8 x 5.0 x 6.3 cm = volume: 196 mL. Diffuse increased echogenicity is noted. Mild pelviectasis is noted although no definitive obstructive changes noted. Bladder: Not well visualized IMPRESSION: Increased echogenicity bilaterally similar to that seen on the prior exam. Mild ascites is noted. Note is also made of splenomegaly and gallbladder sludge similar to that seen on recent ultrasound of the abdomen (04/17/2019). Electronically Signed   By: Inez Catalina M.D.   On: 04/18/2019 17:30   Dg Chest Port 1 View  Result Date: 04/01/2019 CLINICAL DATA:  Shortness of breath EXAM: PORTABLE CHEST 1 VIEW COMPARISON:  Feb 19, 2019 FINDINGS: The previously noted tunneled central venous catheter, tracheostomy tube, and enteric tube have all been removed. The heart size is enlarged. There are multifocal airspace opacities bilaterally. There is volume overload. There are small bilateral pleural effusions, right greater than left. There is no pneumothorax. There is no acute osseous abnormality. There appear to be some old right-sided rib fractures. IMPRESSION: 1. Multifocal airspace opacities bilaterally concerning for multifocal pneumonia (viral or bacterial). 2. Cardiomegaly with generalized volume overload. 3. Small bilateral pleural effusions are noted. Electronically Signed   By: Constance Holster M.D.   On: 03/23/2019 19:08   Vas [place] Lower Extremity Venous (  dvt)  Result Date: 04/17/2019  Lower Venous Study Indications: Swelling. Other Indications: Polysubstance abuse, renal failure, failure to thrive,. Limitations: Poor ultrasound/tissue interface and patient unable to tolerate compression maneuvers. Comparison Study: 02/05/2019 Performing Technologist: Maudry Mayhew MHA, RDMS, RVT,  RDCS  Examination Guidelines: A complete evaluation includes B-mode imaging, spectral Doppler, color Doppler, and power Doppler as needed of all accessible portions of each vessel. Bilateral testing is considered an integral part of a complete examination. Limited examinations for reoccurring indications may be performed as noted.  +---------+---------------+---------+-----------+----------+--------------+ RIGHT    CompressibilityPhasicitySpontaneityPropertiesSummary        +---------+---------------+---------+-----------+----------+--------------+ CFV                     No       Yes                  Pulsatile flow +---------+---------------+---------+-----------+----------+--------------+ FV Prox                          Yes                                 +---------+---------------+---------+-----------+----------+--------------+ FV Mid                           Yes                                 +---------+---------------+---------+-----------+----------+--------------+ FV Distal                        Yes                                 +---------+---------------+---------+-----------+----------+--------------+ PFV                              Yes                                 +---------+---------------+---------+-----------+----------+--------------+ POP                     No       Yes                  Pulsatile flow +---------+---------------+---------+-----------+----------+--------------+ PTV                              Yes                                 +---------+---------------+---------+-----------+----------+--------------+ PERO                             Yes                                 +---------+---------------+---------+-----------+----------+--------------+   +---------+---------------+---------+-----------+----------+--------------+ LEFT     CompressibilityPhasicitySpontaneityPropertiesSummary         +---------+---------------+---------+-----------+----------+--------------+ CFV  No       Yes                  Pulsatile flow +---------+---------------+---------+-----------+----------+--------------+ FV Prox                          Yes                                 +---------+---------------+---------+-----------+----------+--------------+ FV Mid                           Yes                                 +---------+---------------+---------+-----------+----------+--------------+ FV Distal                        Yes                                 +---------+---------------+---------+-----------+----------+--------------+ PFV                              Yes                                 +---------+---------------+---------+-----------+----------+--------------+ POP                     No       Yes                  Pulsatile flow +---------+---------------+---------+-----------+----------+--------------+ PTV                              Yes                                 +---------+---------------+---------+-----------+----------+--------------+ PERO                             Yes                                 +---------+---------------+---------+-----------+----------+--------------+  Summary: Right: No obvious evidence of acute thrombosis by color Doppler. A cystic structure is found in the popliteal fossa. Left: No obvious evidence of acute thrombosis by color Doppler. A cystic structure is found in the popliteal fossa.  Pulsatile venous flow is suggestive of possible elevated right heart pressure. No significant change when compared to prior study. *See table(s) above for measurements and observations. Electronically signed by Harold Barban MD on 04/17/2019 at 79:34:36 PM.    Final    [place] Ekg Site Rite  Result Date: 04/19/2019 If Site Rite image not attached, placement could not be confirmed due to current cardiac  rhythm.  [place] Ekg Site Rite  Result Date: 04/17/2019 If Site Rite image not attached, placement could not be confirmed due to current cardiac rhythm.   ASSESSMENT AND PLAN: 1. Thrombocytopenia secondary to DIC and sepsis 2.  Bacteremia  in the setting of IV drug abuse with a past medical history including bacteremia and endocarditis.  Blood cultures positive for MRSA and Serratia marcescens. 3.  Hyperbilirubinemia secondary to hemolysis 4.  Acute kidney injury superimposed on chronic kidney disease 5.  Anemia secondary to bacteremia, DIC, chronic disease, and renal failure 6.  Splenomegaly-chronic 7.  Tricuspid endocarditis with tricuspid regurgitation 8.  Anasarca 9.  Coagulopathy secondary to DIC, antibiotics, and malnutrition  Ms. Twombly  has persistent severe thrombocytopenia and coagulopathy.  She is currently receiving a platelet transfusion.  Labs from today are pending.  She is oozing from the right IJ site, no other apparent bleeding.  I continue to suspect the severe thrombocytopenia is related to infection and DIC.  Chronic liver disease and polypharmacy may also play a role in the severe thrombocytopenia.  She has chronic splenomegaly, likely related to liver disease and recurrent left and right sided endocarditis.  1.    Continue antibiotics as recommended per infectious disease 2.    Follow-up daily platelet count and transfuse for a count of less than 10,000 or bleeding 3.    Cryoprecipitate today if the fibrinogen returns at less than 100. 4.    Transfuse packed red blood cells for hemoglobin of less than 7 5.    IV team/interventional radiology evaluation for persistent oozing at the IJ site   Hematology will continue following her daily.    LOS: 4 days   Betsy Coder, DNP, AGPCNP-BC, AOCNP 04/20/19

## 2019-04-20 NOTE — Progress Notes (Signed)
Called by bedside RN, mother is very upset about stopping abx.  She now states that patient did not know what she was talking about.  She does not want to speak with CCM anymore.  I spoke with palliative care MD.  He is going to see patient's mother right now.  Rush Farmer, M.D. [facility] Pulmonary/Critical Care Medicine. Pager: [phone]. After hours pager: [phone].

## 2019-04-20 NOTE — Progress Notes (Signed)
Primary Cardiologist:  Ceaira Ernster [place] :    Subjective:  Lethargic poor historian no obvious tachypnea  Objective:  Vitals:   04/20/19 0500 04/20/19 0632 04/20/19 0650 04/20/19 0752  BP:  107/83 110/79 107/82  Pulse:  (!) 113 (!) 110 (!) 113  Resp:  (!) 42 (!) 44 (!) 50  Temp:  99.7 F (37.6 C) 99 F (37.2 C) 98.6 F (37 C)  TempSrc:  Oral Oral Oral  SpO2:  97% 100% 99%  Weight: 49.6 kg     Height:        Intake/Output from previous day:  Intake/Output Summary (Last 24 hours) at 04/20/2019 0810 Last data filed at 04/20/2019 0425 Gross per 24 hour  Intake 888 ml  Output 4275 ml  Net -3387 ml    Physical Exam: Chronically ill female JVP elevated  TR murmnur  Basilar atelectasis LE edema Positive HJR  Lab Results: Basic Metabolic Panel: Recent Labs    04/18/19 0201 04/19/19 1720  NA 133* 136  K 3.9 3.5  CL 104 104  CO2 14* 19*  GLUCOSE 66* 85  BUN 66* 85*  CREATININE 2.61* 2.67*  CALCIUM 7.5* 7.6*   Liver Function Tests: Recent Labs    04/18/19 0201 04/19/19 1720  AST 14* 14*  ALT 10 8  ALKPHOS 58 48  BILITOT 8.0* 6.4*  6.3*  PROT 5.0* 5.3*  ALBUMIN 1.4* 1.4*   No results for input(s): LIPASE, AMYLASE in the last 72 hours. CBC: Recent Labs    04/18/19 0201 04/19/19 1720  WBC 6.1 3.8*  NEUTROABS 5.0 3.2  HGB 9.0* 7.6*  HCT 28.1* 23.8*  MCV 91.2 93.0  PLT 11*  10* 5*  6*   Cardiac Enzymes: Recent Labs    04/18/19 0201  CKTOTAL 21*   BNP: Invalid input(s): POCBNP D-Dimer: Recent Labs    04/18/19 0201 04/19/19 1720  DDIMER 11.80* 16.27*    Anemia Panel: Recent Labs    04/18/19 0201  RETICCTPCT 0.5    Imaging: US Renal  Result Date: 04/18/2019 CLINICAL DATA:  Acute on chronic renal injury EXAM: RENAL / URINARY TRACT ULTRASOUND COMPLETE COMPARISON:  01/20/2019 FINDINGS: Right Kidney: Renal measurements: 11.8 x 4.7 x 5.8 cm = volume: 169 mL. Diffuse increased echogenicity is identified. Mild pelviectasis is noted although  no definitive obstructive changes are seen. Mild ascites is seen Left Kidney: Renal measurements: 11.8 x 5.0 x 6.3 cm = volume: 196 mL. Diffuse increased echogenicity is noted. Mild pelviectasis is noted although no definitive obstructive changes noted. Bladder: Not well visualized IMPRESSION: Increased echogenicity bilaterally similar to that seen on the prior exam. Mild ascites is noted. Note is also made of splenomegaly and gallbladder sludge similar to that seen on recent ultrasound of the abdomen (04/17/2019). Electronically Signed   By: Inez Catalina M.D.   On: 04/18/2019 17:30   [place] Ekg Site Rite  Result Date: 04/19/2019 If Site Rite image not attached, placement could not be confirmed due to current cardiac rhythm.   Cardiac Studies:  ECG: ST rate 138  [id number]    Telemetry: SR / ST   Echo:  1. The left ventricle has severely reduced systolic function, with an ejection fraction of 25-30%. The cavity size was normal. Left ventricular diastolic Doppler parameters are consistent with impaired relaxation. Left ventricular diffuse hypokinesis.  2. Small pericardial effusion.  3. All leaflets of the tricuspid valve and the chordal structure appear involved by large, mobile vegetation. Severe tricuspid regurgitation in the setting  of incomplete coaptation of the damaged leaflets.  4. The aortic valve is tricuspid. Mild calcification of the aortic valve. No stenosis of the aortic valve. No aortic valve vegetation.  5. The aortic root is normal in size and structure.  6. No evidence of mitral valve stenosis. Trivial mitral regurgitation. No vegetation on the mitral valve.  7. No vegetation on the pulmonic valve.  8. The right ventricle has normal systolic function. The cavity was mildly enlarged. There is no increase in right ventricular wall thickness. D-shaped interventricular septum is suggestive of RV pressure/volume overload.  9. The inferior vena cava was dilated in size with <50% respiratory  variability. PA systolic pressure 63 mmHg.  Medications:   . Chlorhexidine Gluconate Cloth  6 each Topical Q0600  . furosemide  40 mg Intravenous Q12H  . mupirocin ointment  1 application Nasal BID  . sodium bicarbonate  650 mg Oral TID  . sodium chloride flush  3 mL Intravenous Q12H  . sodium chloride flush  3 mL Intravenous Q12H  . Thrombi-Pad  1 each Topical Once     . sodium chloride 250 mL (04/19/19 1248)  . [facility]Oline [facility]) IV 300 mg (04/20/19 5638)    Assessment/Plan:   Septic PE/TV SBE:  Continue bid Lasix Careful for hypotension given RV failure back off to daily if BP falls below 100 mmHg. Continue ceftaroline.  Not a candidate for TEE as all information seen on TTE and PLT count only 5K  She is not currently a surgical candidate with ongoing drug use issues and recent septic emboli risk of empyema Will need prolonged antibiotics She can f/u with Dr Martinique as outpatient    Jenkins Rouge 04/20/2019, 8:10 AM

## 2019-04-20 NOTE — Progress Notes (Addendum)
Pt arrived from floor to ICU. Pt RR in 40's and tachycardia with soft BP's. Pt lethargic but easily arousable. Pt asked by MD if she wanted to be palced back on ventilator and pt stated, "Hell no". Pt also asked by same physician if she wanted CPR if her heart would to stop, pt again said, no. Md called pts mother and further discussed code status and poor prognosis given pt not being a surgical candidate at this time. Pt made a DNR and family agreed to make pt comfort care. Pt started on Morphine drip and transferred to floor on comfort care. Mother at the bedside and support given. Pt transferred to 6N16. Report given to [place], RN and no additional questions at this time.

## 2019-04-20 NOTE — Progress Notes (Signed)
Mother arrived, extensive conversation with mother.  After a long discussion decision was made to proceed with morphine drip not only for comfort but for the respiratory effort that is very excessive.  Confirmed DNR status with mother.  Dr. Tawanna Solo to reassume care and patient is to be moved to 6N and Dr. Domingo Cocking will assume care from a palliative standpoint while [facility] for medical issues as primary admitting service.  Discussed with Dr. Domingo Cocking again and given the room number.  The patient is critically ill with multiple organ systems failure and requires high complexity decision making for assessment and support, frequent evaluation and titration of therapies, application of advanced monitoring technologies and extensive interpretation of multiple databases.   Critical Care Time devoted to patient care services described in this note is  45  Minutes. This time reflects time of care of this signee Dr [name]. This critical care time does not reflect procedure time, or teaching time or supervisory time of PA/NP/Med student/Med Resident etc but could involve care discussion time.  Rush Farmer, M.D. [facility] Pulmonary/Critical Care Medicine. Pager: [phone]. After hours pager: [phone].

## 2019-04-20 NOTE — Progress Notes (Addendum)
Received patient from ICU and upon transfer to the unit, mother is at bedside, this Probation officer is trying to update the mother with the plan of care and the mother asked this writer what kind of IV antibiotics is the patient on. This Probation officer explained to the mother about the plan and mother got upset saying so "they just put her here to die, I asked my daughter if she wants to die and she said no". Mother demanding to talk with MD specifically Dr. Christell Faith. Attending Md called, I was referred to call Dr. Nelda Marseille. Dr. Nelda Marseille talked with the mother but still wanting some explanation and wanting to talk with other MD.

## 2019-04-21 DIAGNOSIS — J9601 Acute respiratory failure with hypoxia: Secondary | ICD-10-CM

## 2019-04-21 DIAGNOSIS — Z515 Encounter for palliative care: Secondary | ICD-10-CM

## 2019-04-21 DIAGNOSIS — Z7189 Other specified counseling: Secondary | ICD-10-CM

## 2019-04-21 LAB — PREPARE PLATELET PHERESIS
Unit division: 0
Unit division: 0

## 2019-04-21 LAB — BPAM PLATELET PHERESIS
Blood Product Expiration Date: 202007032205
Blood Product Expiration Date: 202007042359
ISSUE DATE / TIME: 202007030013
ISSUE DATE / TIME: 202007030623
Unit Type and Rh: 5100
Unit Type and Rh: 6200

## 2019-04-21 LAB — CULTURE, BLOOD (ROUTINE X 2)

## 2019-04-21 MED ORDER — LORAZEPAM 2 MG/ML IJ SOLN: 1.0000 mg | INTRAMUSCULAR | Status: DC | PRN

## 2019-04-22 LAB — URINE CULTURE: Culture: >=100000 — AB

## 2019-04-23 LAB — CULTURE, BLOOD (ROUTINE X 2)
Culture: NO GROWTH
Special Requests: ADEQUATE

## 2019-04-23 LAB — TYPE AND SCREEN
ABO/RH(D): O NEG
Antibody Screen: POSITIVE
DAT, IgG: POSITIVE
Unit division: 0
Unit division: 0

## 2019-04-23 LAB — BPAM RBC
Blood Product Expiration Date: 202007302359
Blood Product Expiration Date: 202007302359
Unit Type and Rh: 9500
Unit Type and Rh: 9500

## 2019-04-23 SURGERY — ECHOCARDIOGRAM, TRANSESOPHAGEAL
Anesthesia: Monitor Anesthesia Care

## 2019-04-30 ENCOUNTER — Encounter (HOSPITAL_COMMUNITY): Payer: Self-pay | Admitting: Interventional Radiology

## 2019-05-19 NOTE — Progress Notes (Signed)
Pt noted to have no pulse , no breathing , pupils dilated, called another nurse, Celso and prounounced pt's time of death at 11:09pm. Patient's mother, Molly [name] called to inform and she stated that she will call back for funeral information. [facility] on call MD texted to inform.

## 2019-05-19 NOTE — Progress Notes (Signed)
Daily Progress Note   Patient Name: Nikka Hakimian       Date: 05/03/19 DOB: October 26, 1985  Age: 33 y.o. MRN#: [id number] Attending Physician: Nita Sells, MD Primary Care Physician: Patient, No Pcp Per Admit Date: 04/17/2019  Reason for Consultation/Follow-up: Terminal Care  Subjective: I saw and examined Tammee this morning.  She did not arouse to gentle verbal or tactile stimulation when I was in the room.  I called and spoke with her mother, Jan.  I let her know that I sent with Tewana this morning for a period of time in order to observe and ensure that I do not see any signs of distress.    Her mother reports planning to come and sit with her today. Length of Stay: 5  Current Medications: Scheduled Meds:  . Chlorhexidine Gluconate Cloth  6 each Topical Q0600  . mupirocin ointment  1 application Nasal BID  . sodium chloride flush  3 mL Intravenous Q12H  . sodium chloride flush  3 mL Intravenous Q12H    Continuous Infusions: . sodium chloride 250 mL (04/19/19 1248)  . dextrose 20 mL/hr at 04/20/19 1421  . morphine 5 mg/hr (2019/05/03 0554)    PRN Meds: sodium chloride, acetaminophen **OR** acetaminophen, diphenhydrAMINE, fentaNYL (SUBLIMAZE) injection, glycopyrrolate **OR** glycopyrrolate **OR** glycopyrrolate, haloperidol lactate, LORazepam, morphine injection, morphine, ondansetron **OR** ondansetron (ZOFRAN) IV, polyvinyl alcohol, sodium chloride flush  Physical Exam         General: Critically ill-appearing.  Jaundiced.  Some periods of apnea noted.  HEENT: central line in place. Heart: Tachycardic. No murmur appreciated. Lungs: Decreased air movement, scattered coarse Abdomen: Soft, nontender.  Ext: + edema Skin: Warm and dry Neuro: Does not arouse to  verbal or tactile stimulation  Vital Signs: BP (!) 73/41 (BP Location: Left Arm)   Pulse (!) 102   Temp 98.2 F (36.8 C) (Axillary)   Resp 20   Ht 5\' 1"  (1.549 m)   Wt 49.6 kg   SpO2 96%   BMI 20.66 kg/m  SpO2: SpO2: 96 % O2 Device: O2 Device: Nasal Cannula O2 Flow Rate: O2 Flow Rate (L/min): 2 L/min  Intake/output summary:   Intake/Output Summary (Last 24 hours) at 2019-05-03 1002 Last data filed at 05-03-2019 0601 Gross per 24 hour  Intake 727.66 ml  Output 350 ml  Net 377.66  ml   LBM: Last BM Date: 04/18/19 Baseline Weight: Weight: 49 kg Most recent weight: Weight: 49.6 kg       Palliative Assessment/Data:      Patient Active Problem List   Diagnosis Date Noted  . Palliative care encounter   . Acute respiratory failure with hypoxemia ([place])   . Sepsis due to methicillin resistant Staphylococcus aureus (MRSA) with disseminated intravascular coagulation ([place]) 04/18/2019  . Sepsis due to Serratia ([place]) 04/18/2019  . Endocarditis of tricuspid valve   . Thrombocytopenia ([place])   . AKI (acute kidney injury) ([place])   . Sepsis with multi-organ dysfunction ([place]) 03/27/2019  . Leg edema 03/21/2019  . Pressure injury of skin 02/15/2019  . Goals of care, counseling/discussion   . Advanced care planning/counseling discussion   . DIC (disseminated intravascular coagulation) ([place])   . Acute encephalopathy 01/20/2019  . Pancytopenia, acquired ([place]) 04/22/2018  . 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 IV drug user 04/22/2018  . MSSA bacteremia 04/22/2018  .  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 Tobacco abuse 04/22/2018  . 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 Addiction to drug ([place])   . Epidural abscess   . Acute bacterial endocarditis   . Chronic thoracic back pain   . Sepsis with multiple organ dysfunction (MOD) ([place]) 04/08/2018  . Eczema 07/12/2017  . Hepatitis C antibody test positive 08/24/2016  . Discitis of lumbosacral region  07/19/2016  . Osteomyelitis of vertebra of thoracolumbar region ([place])   . Polysubstance abuse ([place])   . Pancytopenia ([place]) 11/27/2015  . Acute renal failure superimposed on stage 2 chronic kidney disease ([place]) 11/27/2015  . Acute respiratory failure ([place]) 07/04/2013  . Chronic anemia 07/04/2013  . Pulmonary HTN ([place]) 07/04/2013  . 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 Septic pulmonary embolism ([place]) 07/03/2013  . MRSA bacteremia 07/03/2013    Palliative Care Assessment & Plan   Patient Profile: 33 year old female with past medical history of polysubstance abuse, bacteremia, sepsis, and DIC now on comfort care.  Recommendations/Plan:  Continue plan for comfort care.  Discussed with mother and provided anticipatory guidance on what to expect moving forward.  I do not think that she is clinically stable enough to consider transition from the hospital.  Anticipate hospital death.  Goals of Care and Additional Recommendations:  Limitations on Scope of Treatment: Full Comfort Care  Code Status:    Code Status Orders  (From admission, onward)         Start     Ordered   04/20/19 1331  DNR (Do not attempt resuscitation)  Continuous    Question Answer Comment  In the event of cardiac or respiratory ARREST Do not call a "code blue"   In the event of cardiac or respiratory ARREST Do not perform Intubation, CPR, defibrillation or ACLS   In the event of cardiac or respiratory ARREST Use medication by any route, position, wound care, and other measures to relive pain and suffering. May use oxygen, suction and manual treatment of airway obstruction as needed for comfort.   Comments DNR/DNI.      04/20/19 1330        Code Status History    Date Active Date Inactive Code Status Order ID Comments User Context   04/20/2019 [id number] 04/20/2019 1330 DNR [id number]  Cristal Generous, NP Inpatient   04/17/2019 0010 04/20/2019 1145 Full Code [id number]  Vianne Bulls, MD Inpatient    01/22/2019 1450 03/06/2019 2038 Partial  Code [id number] No cvvh. No MRI. Full medical care otherewise including pressors Brand Males, MD Inpatient   01/20/2019 1234 01/22/2019 1450 Full Code [id number]  Shellia Cleverly, MD Inpatient   01/20/2019 0603 01/20/2019 1234 Full Code [id number]  Etta Quill, DO ED   04/22/2018 1814 04/23/2018 2038 Full Code [id number]  Samella Parr, NP ED   04/08/2018 0106 04/12/2018 1854 Full Code [id number]  Vianne Bulls, MD ED   07/19/2016 1052 07/21/2016 1844 Full Code [id number]  Norman Herrlich, MD ED   12/17/2015 0217 12/22/2015 2004 Full Code [id number]  Bethena Roys, MD Inpatient   11/27/2015 2141 12/01/2015 0213 Full Code [id number]  Riccardo Dubin, MD Inpatient   07/03/2013 2143 07/09/2013 0422 Full Code [id number]  Etta Quill, DO ED   Advance Care Planning Activity       Prognosis:   Hours - Days  Discharge Planning:  Anticipated Hospital Death  Care plan was discussed with Dr. Verlon Au, mother  Thank you for allowing the Palliative Medicine Team to assist in the care of this patient.   Time In: 0920 Time Out: 0950 Total Time 30 Prolonged Time Billed No      Greater than 50%  of this time was spent counseling and coordinating care related to the above assessment and plan.  Micheline Rough, MD  Please contact Palliative Medicine Team phone at [phone] for questions and concerns.

## 2019-05-19 NOTE — Progress Notes (Signed)
  Palliative medicine progress note  Patient seen, chart reviewed.  No family at the bedside.  Patient is minimally responsive to voice and touch.  No longer able to take anything by mouth.  She is actively dying.  Per chart review, minimal PRN's needed for comfort.  No grimacing or nonverbal signs and symptoms of pain.  No tachypnea. Patient is beginning to mottle to her knees  Patient Profile: 33 year old female with past medical history of polysubstance abuse, bacteremia, sepsis, and DIC now on comfort care. Patient is actively dying.  Plan Continue morphine continuous infusion with bolus.  Monitor and titrate for effect Will uptitrate Ativan for any agitation, 1 to 2 mg every 2 hours as needed.  Monitor for need for scheduled dosing No upper airway secretions heard.  Continue with Robinul PRN.  Monitor for need for scheduled dosing  Prognosis Hours to days  Disposition Dissipate hospital death.  Patient is too unstable for transport  Thank you, Romona Curls, NP Total time: 20 minutes Greater than 50% of time was spent in counseling and coordination of care

## 2019-05-19 NOTE — Progress Notes (Signed)
Late entry  I had a 15 minute discussion with Mrs Foye Deer, mother of Darielys, after I received a call from nursing staff this evening.  I reached out to discuss the case with Dr. Domingo Cocking prior to calling Mrs. Sebastiano to get collateral information to determine what his discussion with her was on 7/3 and I reviewed the chart extensively, noting critical care medicine NP's note in addition to documentation from Dr. Domingo Cocking of palliative care and his discussion with ethics  She expressed concern about the patient's current care, and the trajectory of care--- specifically her concerns were that the patient did not understand what she was expressing to critical care when she made a decision not to pursue a curative trajectory.  I explained that documentation of events prior to me being attending physician and the decision for this careplan had been initiated prior to my being Attending of record  Further, based on my interpretation of the notes, it was very clear that the patient had made a sound decision when she had all of her mental faculties and was lucid, prior to initiation of morphine.  I informed Mrs. Janota of this and reminded her of her understanding of the same just yesterday when she spoke to the critical care NP who had been present when [facility] had made these decisions  The patient's mother wishes to transfer the patient to a different hospital and wishes to speak to someone and risk management-I informed her that discussion with risk manage this may be possible but it may not be possible over the holiday weekend to speak to risk management  Further I informed her that clinically based on my objective opinion and on the opinion of nursing staff and palliative care-the patient is moribound--- there is documentation by palliative NP later on this afternoon of her skin being mottled and cooling.  She is not stable for transfer to any other facility at this time if facility could be found by  the family and I do not think that reversal of the care plan to an aggressive attempt at resuscitation would make a large difference in overall very poor outcome.  I ended the conversation by telling her that I would reach out to Dr. Domingo Cocking of palliative medicine to ask him to chat with her about other concerns but at this time we will continue to pursue a comfort trajectory and it is hoped that the patient will not suffer through this  Verneita Griffes, MD [facility] 8:49 PM   > 40 minutes care coordination discussion with consultants and indirect patient time

## 2019-05-19 NOTE — Progress Notes (Signed)
Received call from Dr. Verlon Au that he has spoken with patient's mother regarding concerns with plan for Molly [name]'s care.  I offered to follow-up with her to see if I could offer further support.    I attempted to call her twice at [phone] but no answer and I left a voicemail.  I called the floor to see if she is present at the bedside so that I could come to speak with her, but she is not present.  Molly Rough, MD [facility] Team [phone]

## 2019-05-19 NOTE — Progress Notes (Signed)
  Late entry from this am  Seen examined , chart reviewed ~ 0800 this am  Discussed with RN--some bleeding from central line patient on Morphine gtt--not rousable, but stirs to stimulation BP (!) 73/41 (BP Location: Left Arm)   Pulse (!) 102   Temp 98.2 F (36.8 C) (Axillary)   Resp 20   Ht 5\' 1"  (1.549 m)   Wt 49.6 kg   SpO2 96%   BMI 20.66 kg/m   No distress --I didn't stimulate her too much Pulses are +, bounding--feet warm  Anticipate however hospital death--I called mom, didn;t get her on the phone when I did  NO charge-appreciate Dr. Domingo Cocking lead and care coordination  Verneita Griffes, MD [facility] 10:40 AM

## 2019-05-19 NOTE — Progress Notes (Signed)
Received a call from patient mother, demanding to talk with the chief officer of the hospital. When this writer asked whats going on , the mother stated that she do not agree with the treatment that the team is doing to her daughter and she wants to involve the risk management committee etc to the case of her daughter. I told the mother that I will address her concern . MD called .

## 2019-05-19 DEATH — deceased

## 2019-06-19 NOTE — Discharge Summary (Signed)
Death Summary  Sheriden Blakemore [id number] DOB: 19-Jun-1986 DOA: 04-20-2019  PCP: Patient, No Pcp Per  Admit date: 04/20/2019 Date of Death: 06-16-19 Time of Death: 11:09 pm Notification: Patient, No Pcp Per notified of death of 2019-06-16   History of present illness:  33 year old female with history of IV drug abuse, MRSA tricuspid valve endocarditis, history of epidural abscess, chronic renal insufficiency, seizures, noncompliance who presents to the emergency department for the evaluation of progressive leg swelling, shortness of breath and generalized body aches and pains.  On presentation, she was found to be afebrile, tachycardic, tachypneic and hypotensive.  Chest x-ray was concerning for multifocal airspace opacities bilaterally suggesting multifocal pneumonia as well as cardiomegaly and volume overload.  She was found to have hyponatremia, hypokalemia, acute kidney injury.  New thrombocytopenia with platelets of 8000, anemia, elevated lactic acid and elevated INR. Blood cultures have been sent which has shown MRSA and Serratia.  She has been started on broad-spectrum antibiotics.  ID already consulted. She has picture of DIC most likely associated with sepsis.  ID,PCCM and hematology following.  7/1: Started on lasix for severe peripheral edema 7/2: Echocardiogram shows ejection fraction of 25 to 30%.  Cardiology consulted.  Antibiotics changed to Teflaro.  Undergoing PICC line placement.  Labs still pending because lab technician were unable to get blood. 7/3: Patient became lethargic and tachypneic this morning.  Patient transferred to ICU.  She was made DNR over there.  Palliative care consulted.  Now initiating full comfort measures. 7/4: blood pressures dropping  --patient was comfortable on morphine Gtt--long and detailed conversation [see my extensve annotation from 7/4 pm] with the patients mother on the telephone at around 6 PM on this day-patient's mother seemed to vacillate  on comfort course of care-I explained to the patient mother as best as I could in addition to recapping critical care progress note where she had a discussion with Ms. Bowser who actually had been at the bedside and had confirmed when the patient was more coherent DNR status and the fact that she would not of wanted aggressive measurements-I did discuss also with palliative care physician Dr. Domingo Cocking regarding the patient and current status as I did not have much collateral and he was of the opinion that there was no specific indication to call ethics-the patient had made this decision with full clarity The patient subsequently expired at 11 PM that evening  Final Diagnoses:  1.   Overwhelming sepsis and DIC from MRSA and Serratia   The results of significant diagnostics from this hospitalization (including imaging, microbiology, ancillary and laboratory) are listed below for reference.    Significant Diagnostic Studies: No results found.  Microbiology: No results found for this or any previous visit (from the past 240 hour(s)).   Labs: Basic Metabolic Panel: No results for input(s): NA, K, CL, CO2, GLUCOSE, BUN, CREATININE, CALCIUM, MG, PHOS in the last 168 hours. Liver Function Tests: No results for input(s): AST, ALT, ALKPHOS, BILITOT, PROT, ALBUMIN in the last 168 hours. No results for input(s): LIPASE, AMYLASE in the last 168 hours. No results for input(s): AMMONIA in the last 168 hours. CBC: No results for input(s): WBC, NEUTROABS, HGB, HCT, MCV, PLT in the last 168 hours. Cardiac Enzymes: No results for input(s): CKTOTAL, CKMB, CKMBINDEX, TROPONINI in the last 168 hours. D-Dimer No results for input(s): DDIMER in the last 72 hours. BNP: Invalid input(s): POCBNP CBG: No results for input(s): GLUCAP in the last 168 hours. Anemia work up No results for  input(s): VITAMINB12, FOLATE, FERRITIN, TIBC, IRON, RETICCTPCT in the last 72 hours. Urinalysis    Component Value Date/Time    COLORURINE AMBER (A) 04/10/2019 1950   APPEARANCEUR HAZY (A) 04/12/2019 1950   LABSPEC 1.012 04/08/2019 1950   PHURINE 6.0 04/10/2019 1950   GLUCOSEU NEGATIVE 03/19/2019 1950   HGBUR LARGE (A) 03/21/2019 1950   BILIRUBINUR NEGATIVE 04/01/2019 [place] NEGATIVE 04/10/2019 1950   PROTEINUR 100 (A) 03/25/2019 1950   NITRITE NEGATIVE 03/31/2019 1950   LEUKOCYTESUR LARGE (A) 04/11/2019 1950   Sepsis Labs Invalid input(s): PROCALCITONIN,  WBC,  LACTICIDVEN     SIGNED:  Nita Sells, MD  [facility] 06/12/2019, 2:47 PM Pager   If 7PM-7AM, please contact night-coverage [email or web address] Password [email or web address]

## 2020-12-13 IMAGING — CT CT HEAD WITHOUT CONTRAST
3 series · 15 of 47 positions shown, 18 images · non-contrast
Comparison: December 17, 2015

CLINICAL DATA: Altered mental status.

EXAM:
CT HEAD WITHOUT CONTRAST
TECHNIQUE: Contiguous axial images were obtained from the base of the skull
through the vertex without intravenous contrast.

[Series 2: head wo · axial · 0.47mm/px · z∈[-61,+69]mm · 9 of 32 slices shown, 12 images]
[im 3/32  brain]
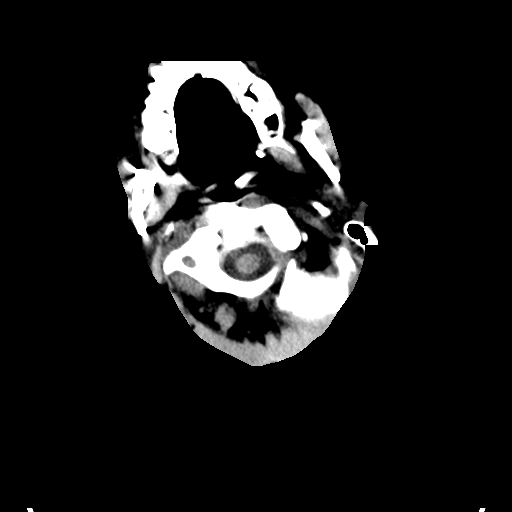
[im 3/32  bone]
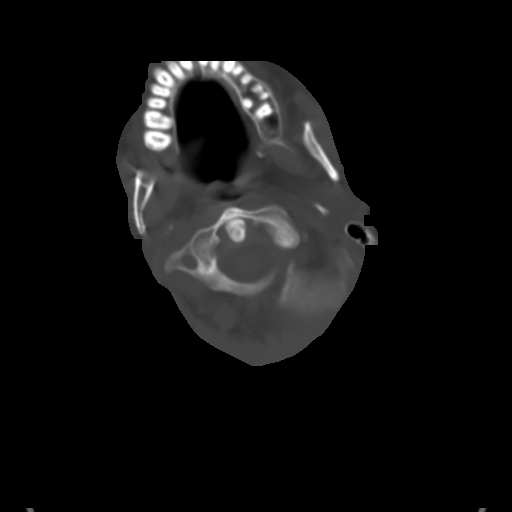
[im 6/32  brain]
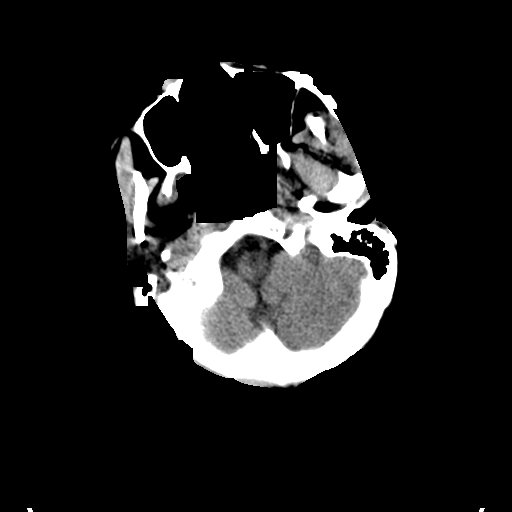
[im 9/32  brain]
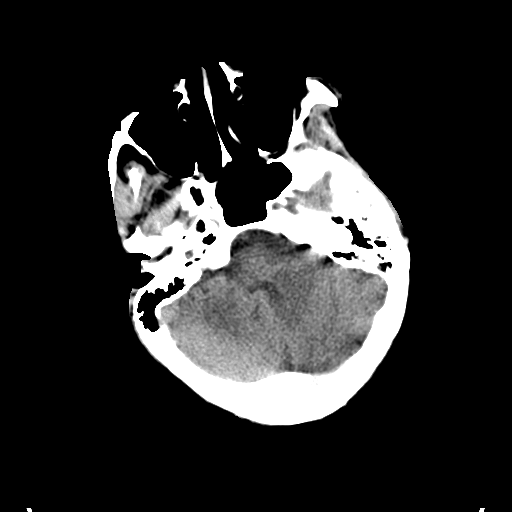
[im 12/32  brain]
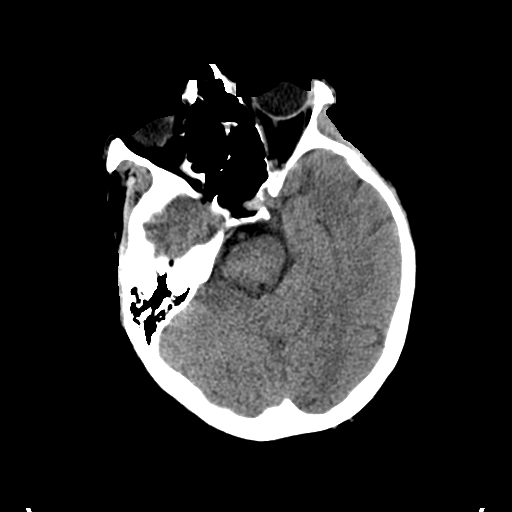
[im 17/32  brain]
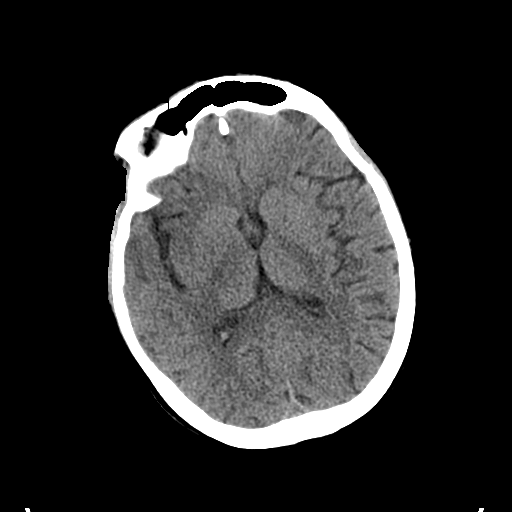
[im 17/32  bone]
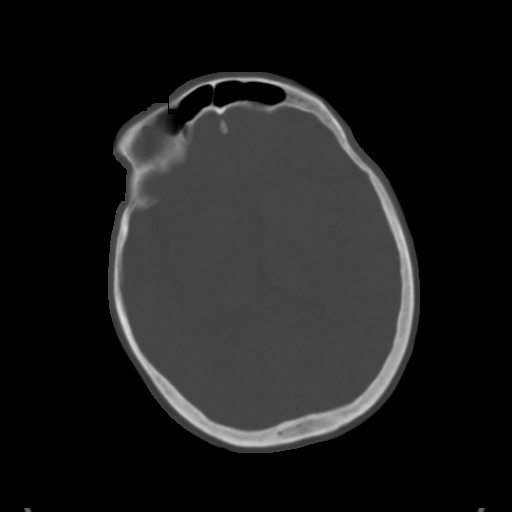
[im 20/32  brain]
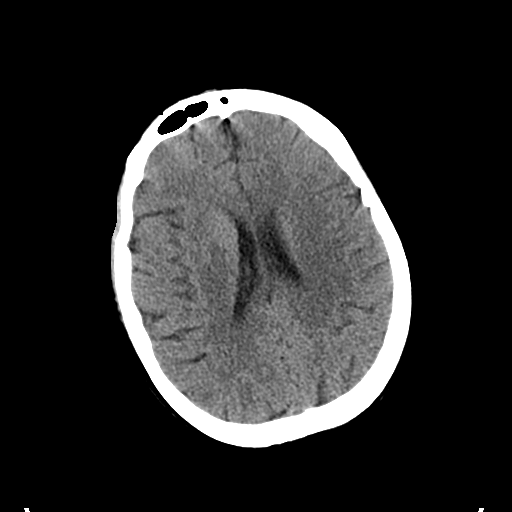
[im 23/32  brain]
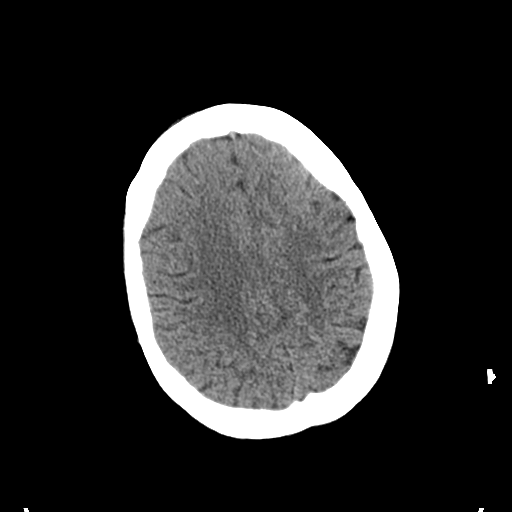
[im 26/32  brain]
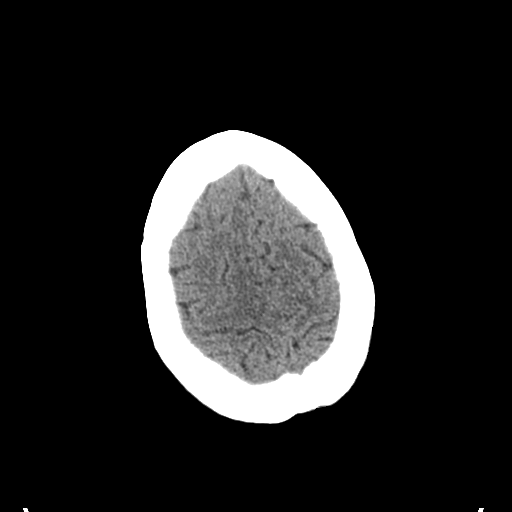
[im 29/32  brain]
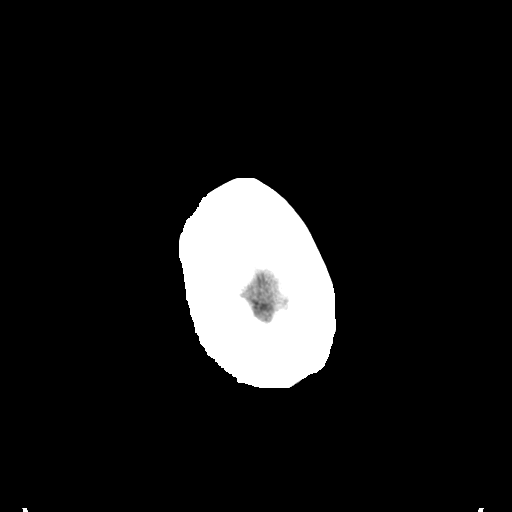
[im 29/32  bone]
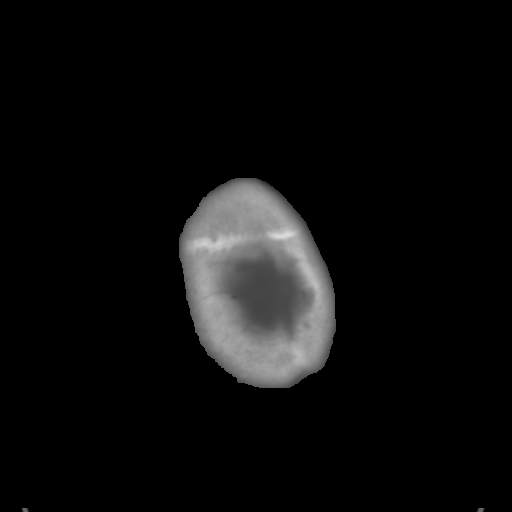

[Series 5: coronal soft tissue · coronal · 0.31mm/px · 3 of 84 slices shown]
[im 28/84  brain]
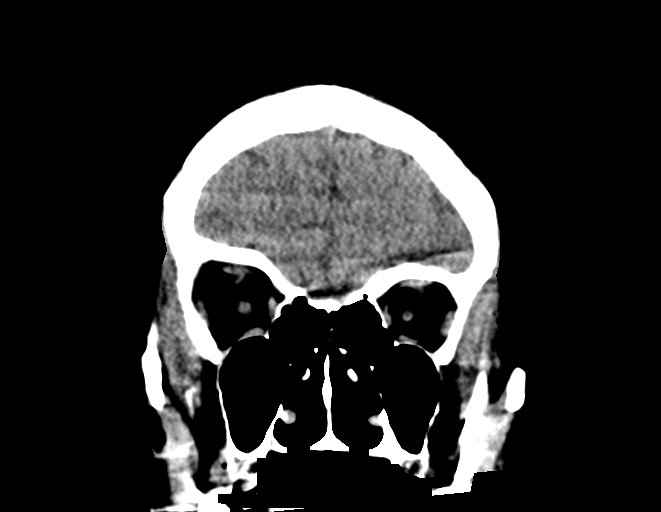
[im 37/84  brain]
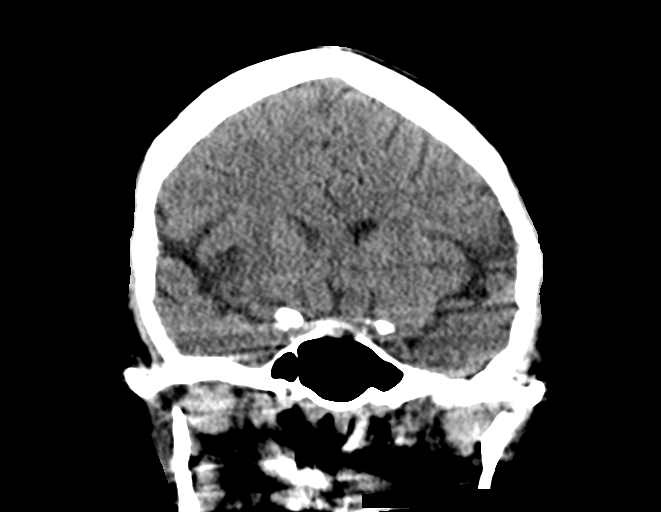
[im 47/84  brain]
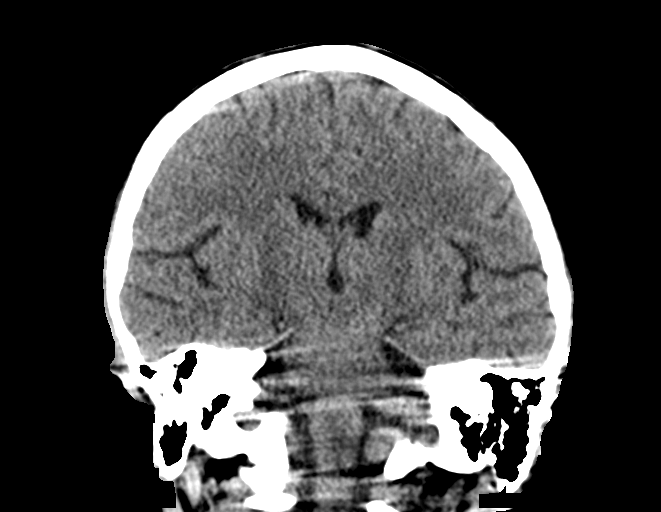

[Series 6: sagittal soft tissue · sagittal · 0.31mm/px · 3 of 56 slices shown]
[im 19/56  brain]
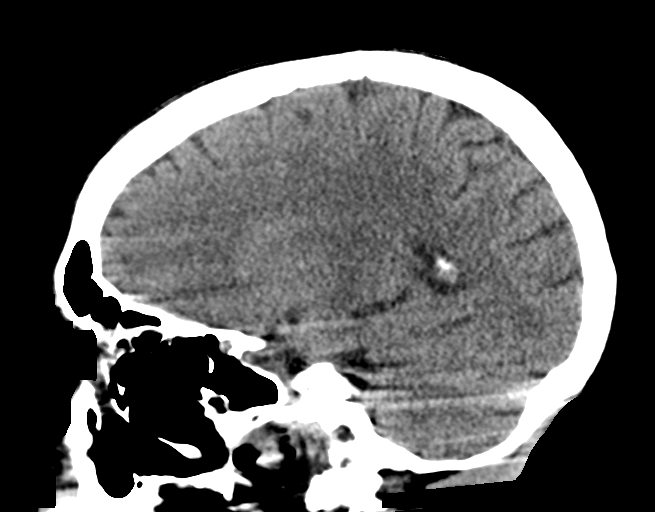
[im 28/56  brain]
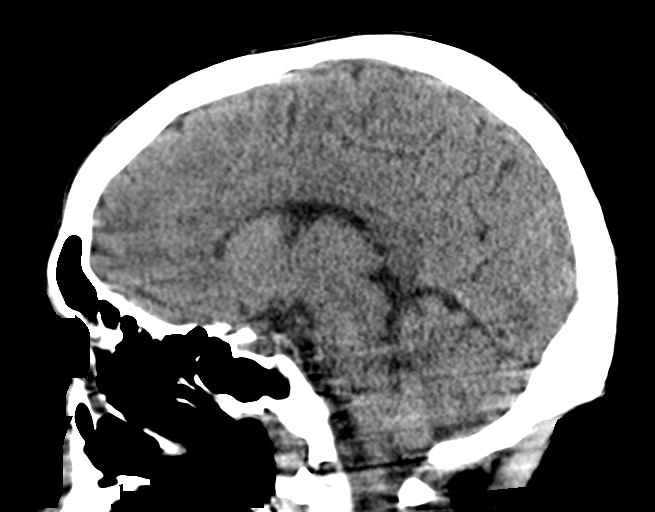
[im 37/56  brain]
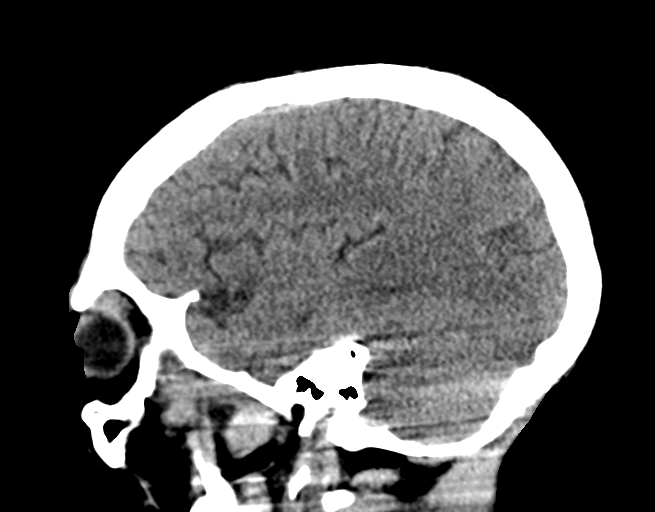

[15 of 47 positions shown; findings below may reference images not displayed]

FINDINGS: Brain: The study is somewhat limited due to patient
combativeness/motion. Within this limitation, no subdural, epidural,
or subarachnoid hemorrhage. Cerebellum, brainstem, and basal
cisterns are normal. Ventricles and sulci are unremarkable. No mass
effect or midline shift. No acute cortical ischemia or infarct.

Vascular: No hyperdense vessel or unexpected calcification.

Skull: Normal. Negative for fracture or focal lesion.

Sinuses/Orbits: No acute finding.

Other: None.
IMPRESSION: No acute intracranial abnormalities noted.

## 2020-12-13 IMAGING — DX PORTABLE CHEST - 1 VIEW
1 series · 1 of 1 positions shown · non-contrast
Comparison: Chest CT from yesterday

CLINICAL DATA: Endotracheal tube placement

EXAM:
PORTABLE CHEST 1 VIEW

[chest ap]
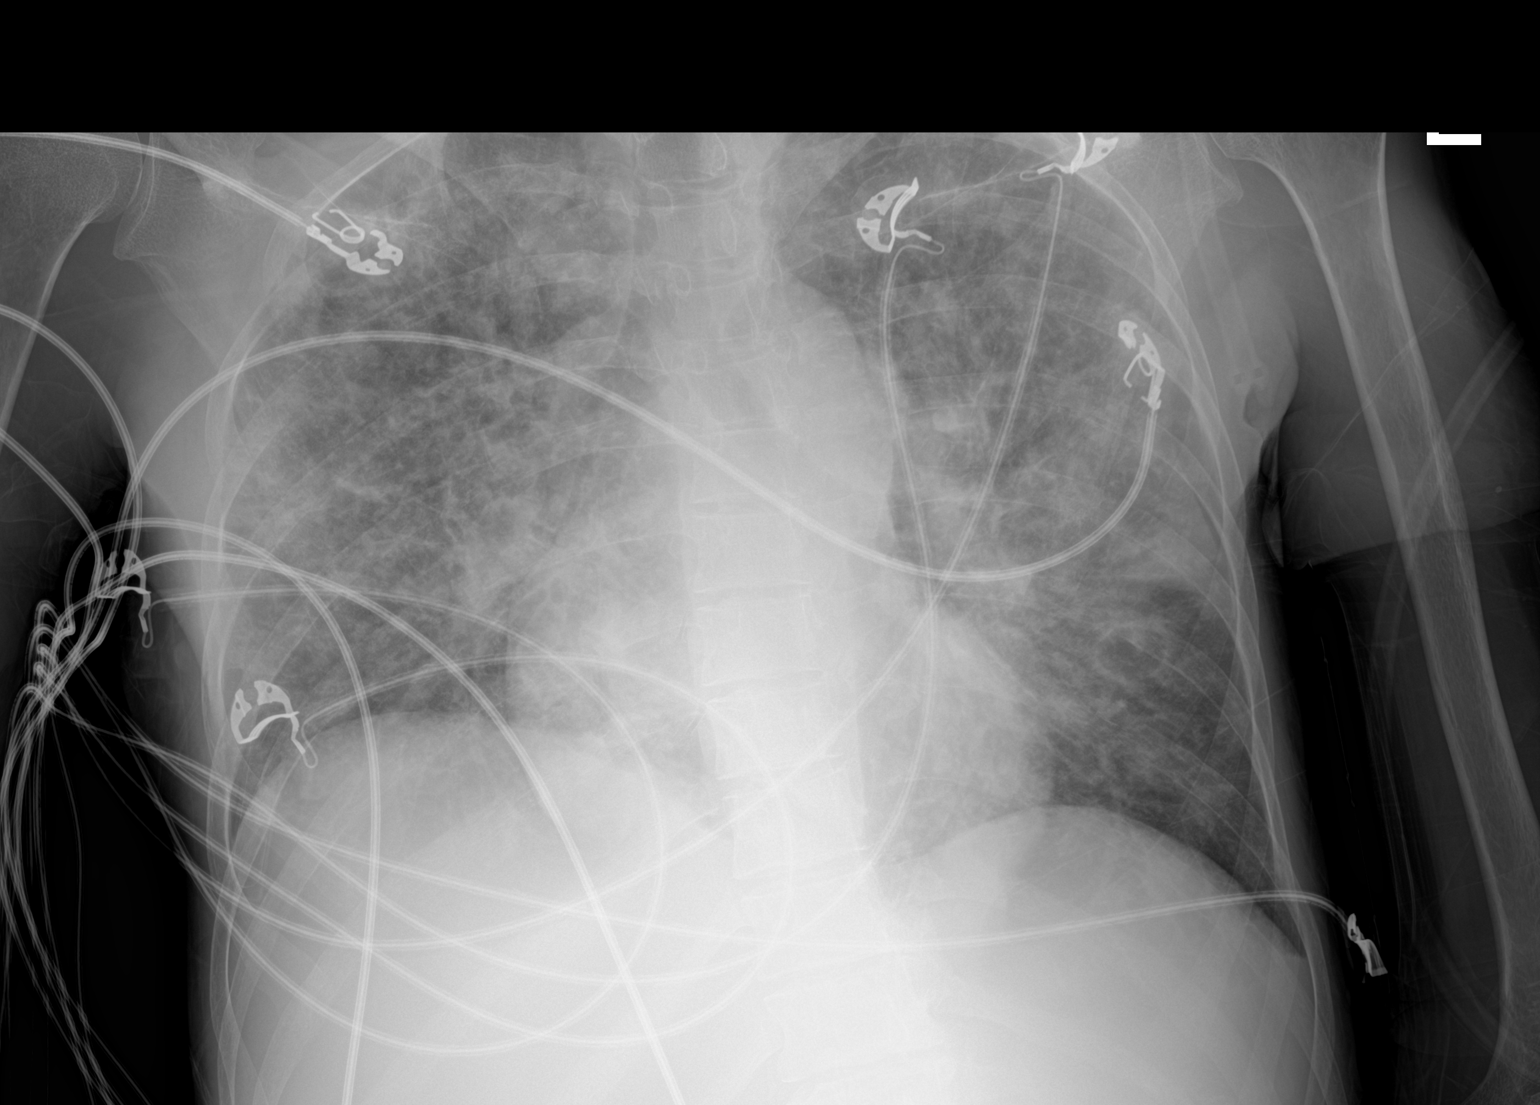

[1 of 1 positions shown; findings below may reference images not displayed]

FINDINGS: Multifocal cavitary pulmonary nodule on a background of diffuse hazy
interstitial and airspace opacity. No effusion or pneumothorax.

Endotracheal tube tip at T1-2, just above the clavicular heads.
IMPRESSION: Mildly high endotracheal tube, tip projecting at T1-2, 6.4 cm above
the carina.

Findings of septic pulmonary emboli.  No pneumothorax.

## 2020-12-13 IMAGING — US US RENAL
1 series · 14 of 25 positions shown · non-contrast
Comparison: 04/08/2018

CLINICAL DATA: Acute renal failure.

EXAM:
RENAL / URINARY TRACT ULTRASOUND COMPLETE

[Series 1: us renal · 14 of 26 slices shown]
[im 1/26]
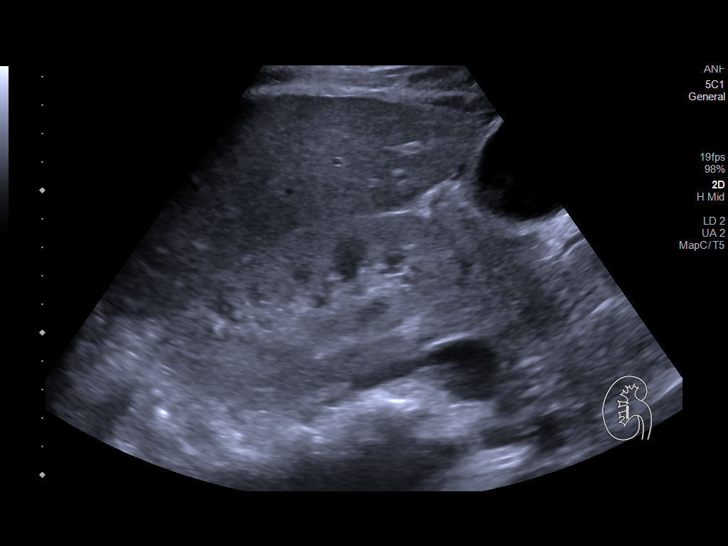
[im 3/26]
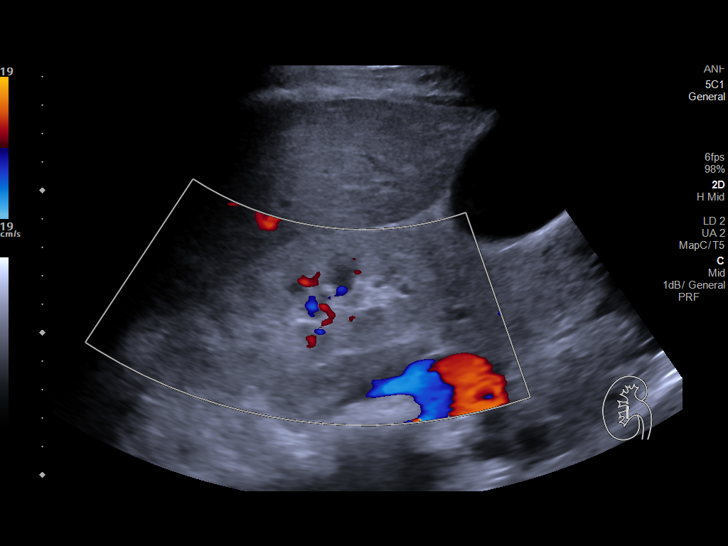
[im 5/26]
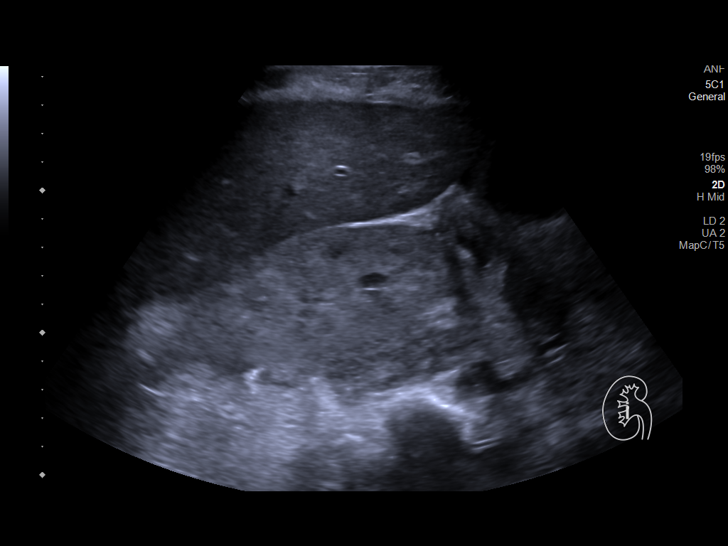
[im 7/26]
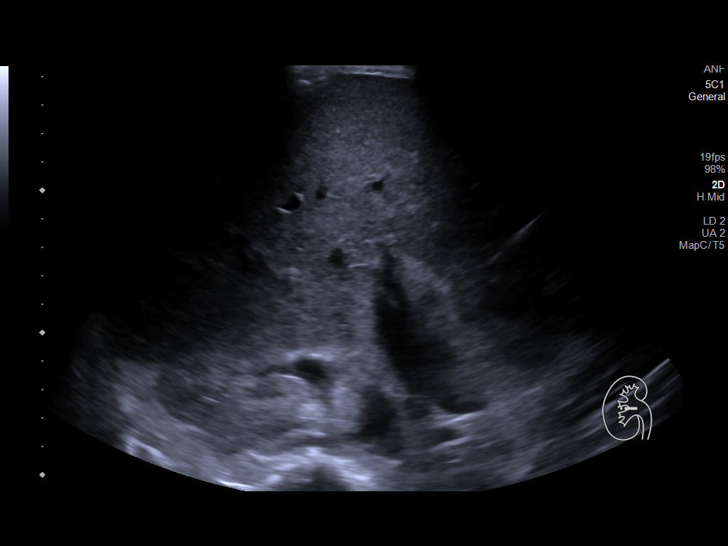
[im 9/26]
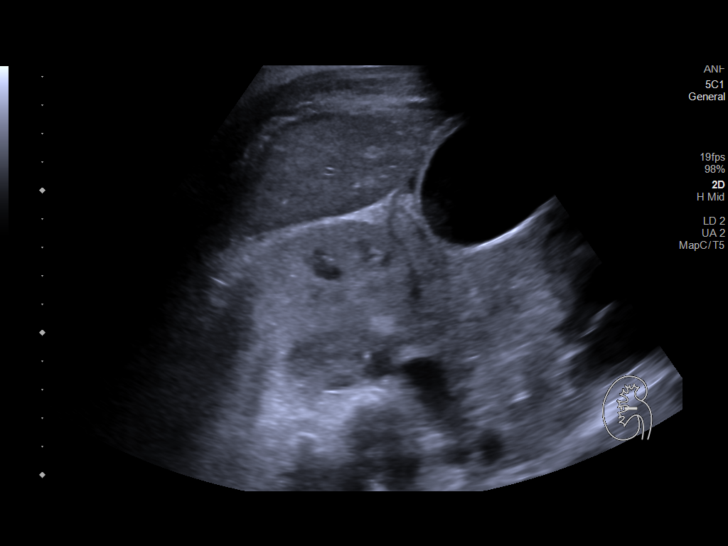
[im 10/26]
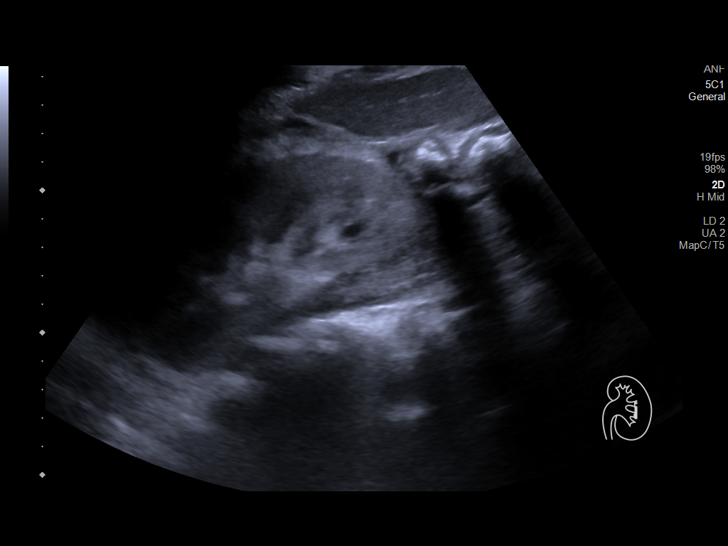
[im 12/26]
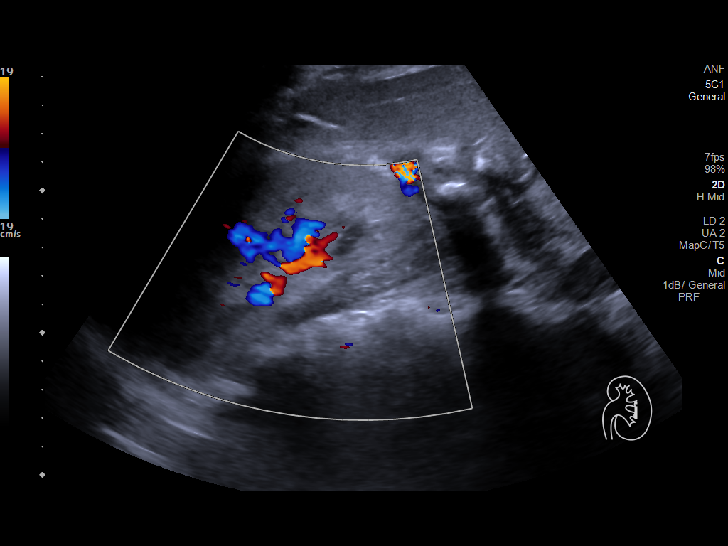
[im 14/26]
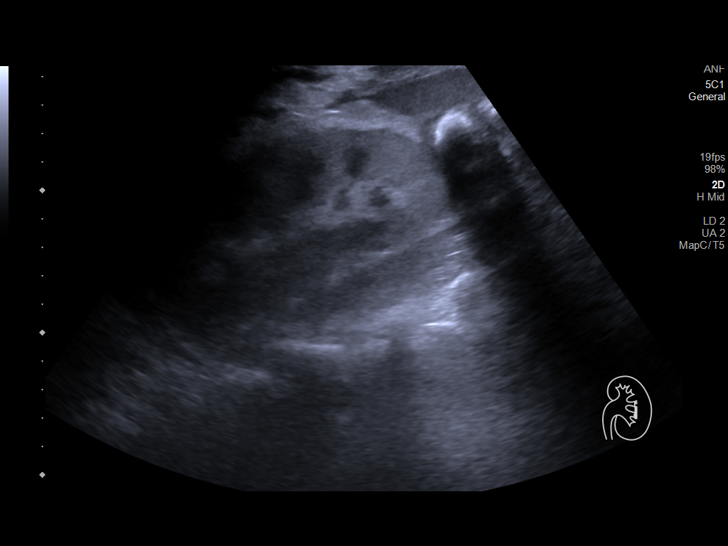
[im 16/26]
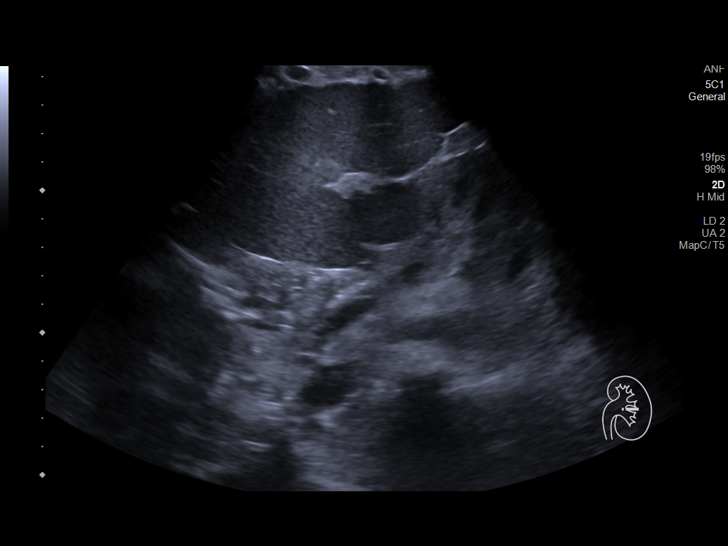
[im 17/26]
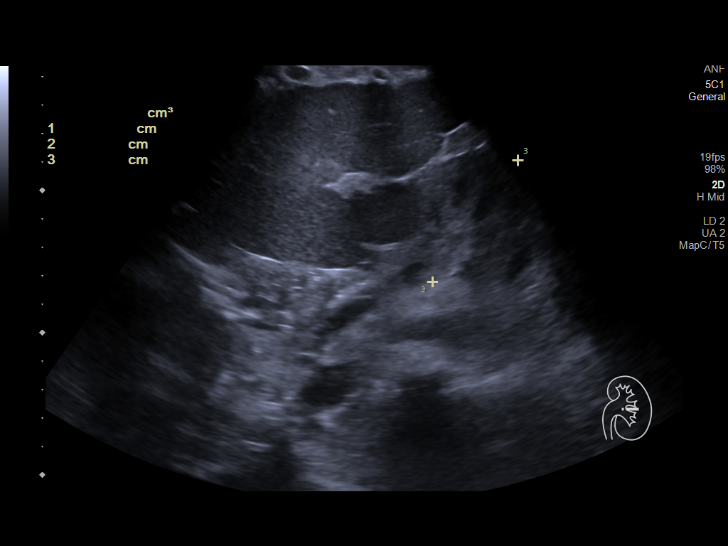
[im 19/26]
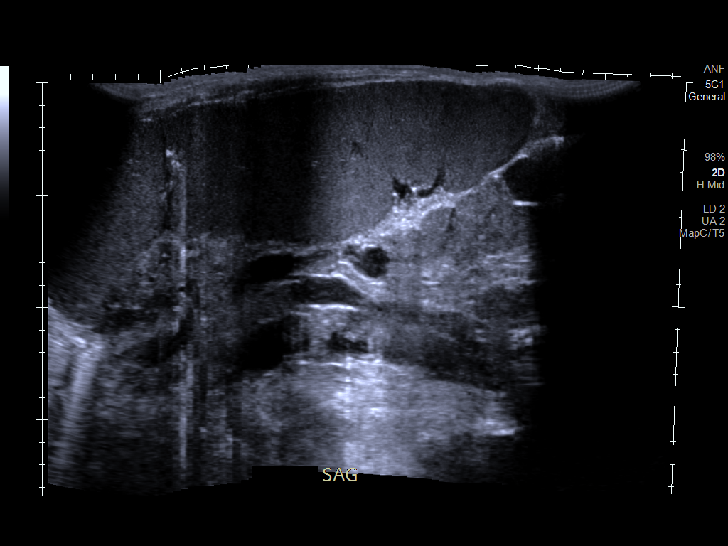
[im 21/26]
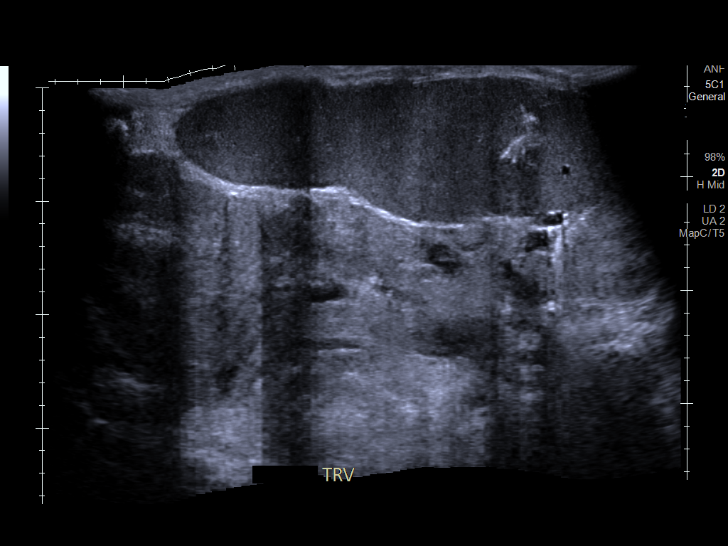
[im 23/26]
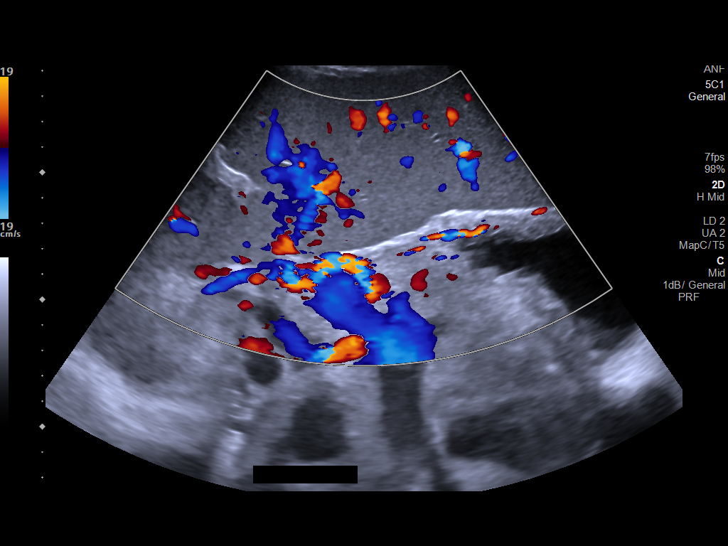
[im 26/26]
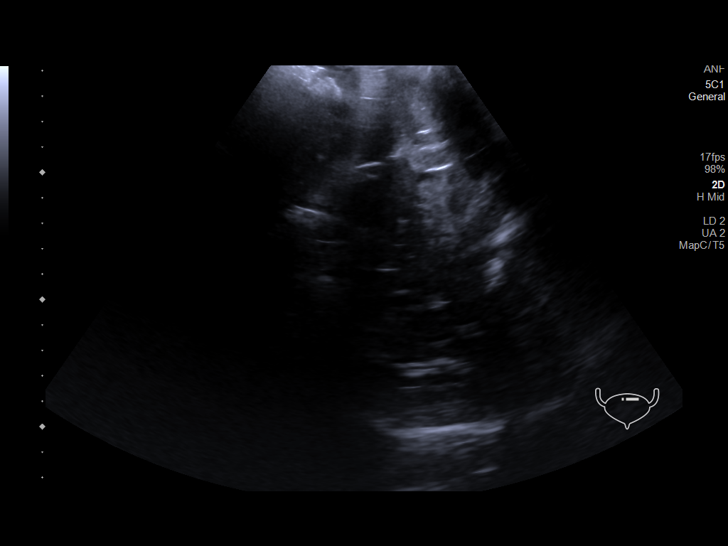

[14 of 25 positions shown; findings below may reference images not displayed]

FINDINGS: Right Kidney:

Renal measurements: 11.6 x 4.5 x 6.1 cm = volume: 167 mL. Diffusely
increased renal parenchymal echogenicity, similar to prior exam. No
mass or hydronephrosis visualized.

Left Kidney:

Renal measurements: 10.4 x 5.2 x 5.2 cm = volume: 148 mL. Diffusely
increased renal parenchymal echogenicity. No mass or hydronephrosis
visualized.

Bladder:

Appears normal for degree of bladder distention.

Other: Marked splenomegaly again noted.
IMPRESSION: Increased bilateral renal parenchymal echogenicity, consistent with
chronic medical renal disease. No evidence of renal mass or
hydronephrosis.

Marked splenomegaly, without significant change compared to prior
exam.

## 2020-12-16 IMAGING — DX PORTABLE CHEST - 1 VIEW
1 series · 1 of 1 positions shown · non-contrast
Comparison: 01/20/2019

CLINICAL DATA: Check endotracheal tube placement, respiratory
failure

EXAM:
PORTABLE CHEST 1 VIEW

[chest]
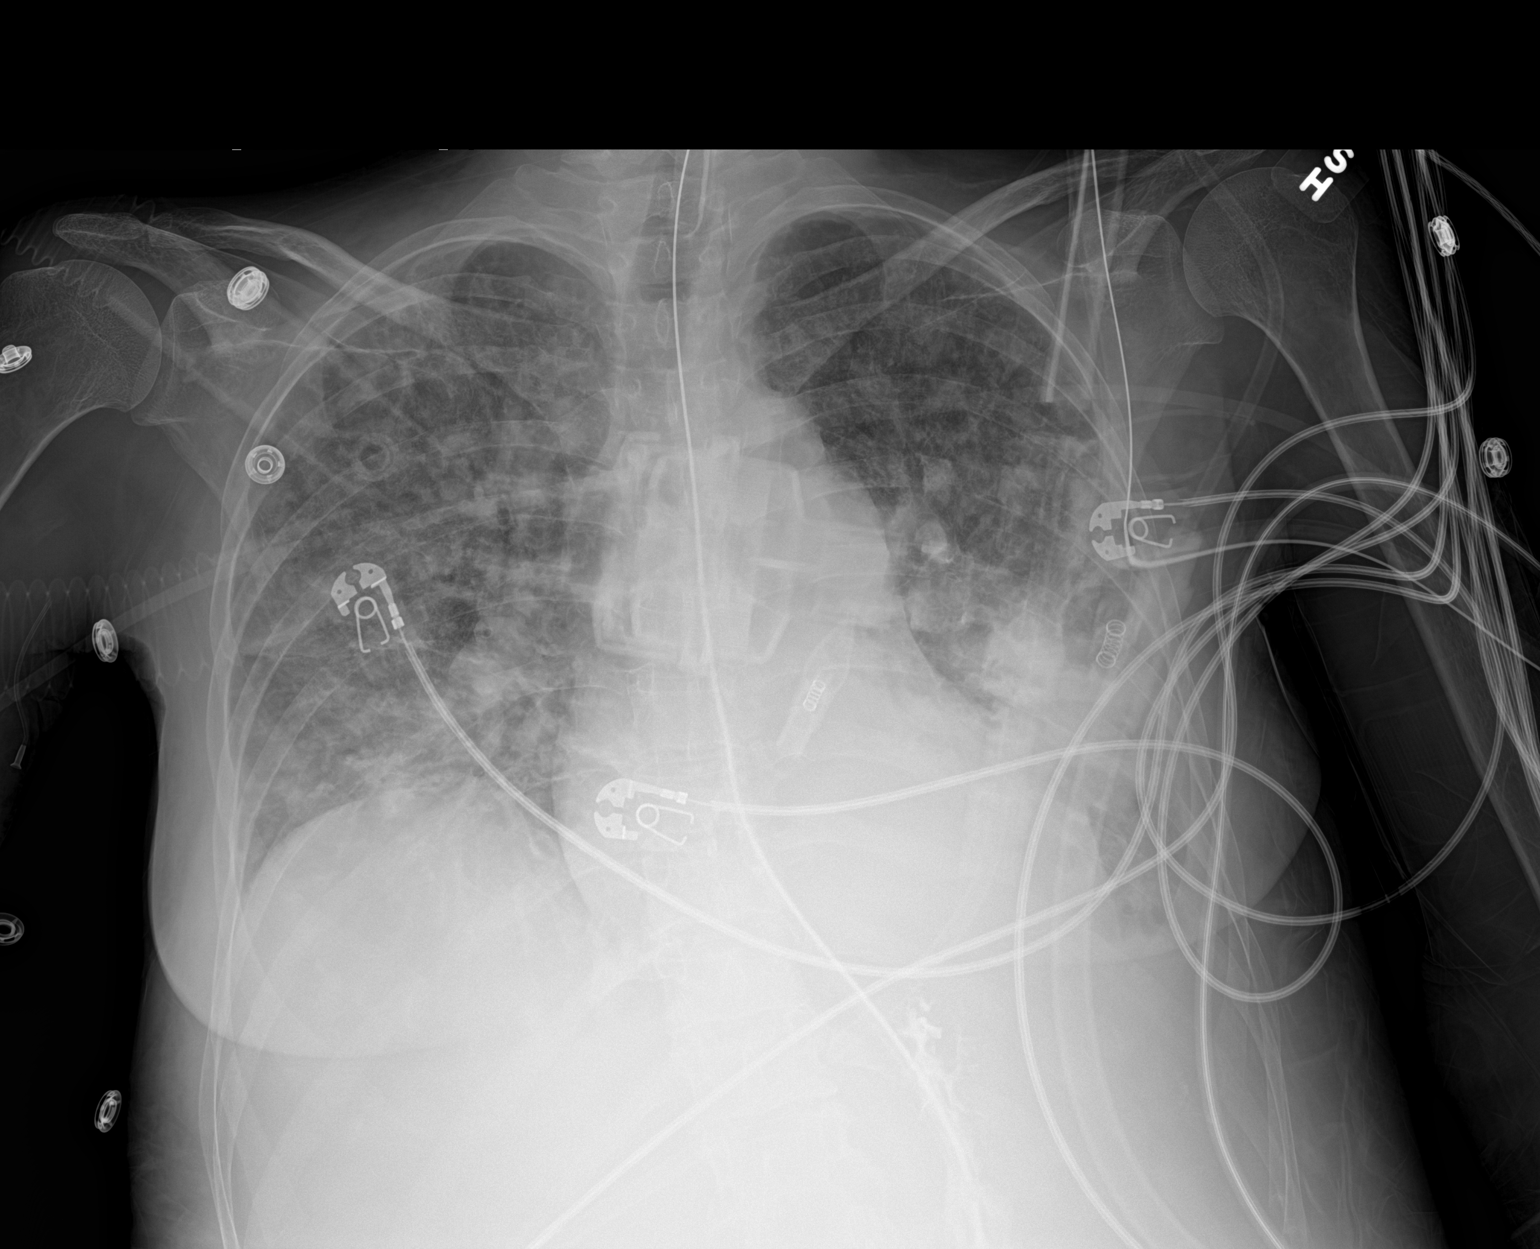

[1 of 1 positions shown; findings below may reference images not displayed]

FINDINGS: Cardiac shadow is stable. Endotracheal tube and gastric catheter are
noted in satisfactory position. Lungs are well aerated bilaterally
with diffuse bilateral infiltrates relatively stable from the prior
exam with the exception of the left mid lung which shows mild
increased consolidation and new effusion.
IMPRESSION: Bilateral infiltrates slightly increased in the left mid lung with
new left pleural effusion identified.
